# Patient Record
Sex: Male | Born: 1945 | Race: White | Hispanic: No | Marital: Married | Smoking: Former smoker
Health system: Southern US, Community
[De-identification: ages and names within clinical notes are randomized; demographics above are authoritative.]

## PROBLEM LIST (undated history)

## (undated) DIAGNOSIS — Z87442 Personal history of urinary calculi: Secondary | ICD-10-CM

## (undated) DIAGNOSIS — R0602 Shortness of breath: Secondary | ICD-10-CM

## (undated) DIAGNOSIS — M1990 Unspecified osteoarthritis, unspecified site: Secondary | ICD-10-CM

## (undated) DIAGNOSIS — I10 Essential (primary) hypertension: Secondary | ICD-10-CM

## (undated) DIAGNOSIS — I499 Cardiac arrhythmia, unspecified: Secondary | ICD-10-CM

## (undated) DIAGNOSIS — F329 Major depressive disorder, single episode, unspecified: Secondary | ICD-10-CM

## (undated) DIAGNOSIS — G4730 Sleep apnea, unspecified: Secondary | ICD-10-CM

## (undated) DIAGNOSIS — K5790 Diverticulosis of intestine, part unspecified, without perforation or abscess without bleeding: Secondary | ICD-10-CM

## (undated) DIAGNOSIS — M109 Gout, unspecified: Secondary | ICD-10-CM

## (undated) DIAGNOSIS — Z923 Personal history of irradiation: Secondary | ICD-10-CM

## (undated) DIAGNOSIS — R011 Cardiac murmur, unspecified: Secondary | ICD-10-CM

## (undated) DIAGNOSIS — K635 Polyp of colon: Secondary | ICD-10-CM

## (undated) DIAGNOSIS — N200 Calculus of kidney: Secondary | ICD-10-CM

## (undated) DIAGNOSIS — E785 Hyperlipidemia, unspecified: Secondary | ICD-10-CM

## (undated) DIAGNOSIS — J4489 Other specified chronic obstructive pulmonary disease: Secondary | ICD-10-CM

## (undated) DIAGNOSIS — I447 Left bundle-branch block, unspecified: Secondary | ICD-10-CM

## (undated) DIAGNOSIS — F32A Depression, unspecified: Secondary | ICD-10-CM

## (undated) DIAGNOSIS — R079 Chest pain, unspecified: Secondary | ICD-10-CM

## (undated) DIAGNOSIS — H5461 Unqualified visual loss, right eye, normal vision left eye: Secondary | ICD-10-CM

## (undated) DIAGNOSIS — K219 Gastro-esophageal reflux disease without esophagitis: Secondary | ICD-10-CM

## (undated) DIAGNOSIS — G4733 Obstructive sleep apnea (adult) (pediatric): Secondary | ICD-10-CM

## (undated) DIAGNOSIS — J449 Chronic obstructive pulmonary disease, unspecified: Secondary | ICD-10-CM

## (undated) DIAGNOSIS — D649 Anemia, unspecified: Secondary | ICD-10-CM

## (undated) DIAGNOSIS — H269 Unspecified cataract: Secondary | ICD-10-CM

## (undated) DIAGNOSIS — I509 Heart failure, unspecified: Secondary | ICD-10-CM

## (undated) DIAGNOSIS — H349 Unspecified retinal vascular occlusion: Secondary | ICD-10-CM

## (undated) DIAGNOSIS — N189 Chronic kidney disease, unspecified: Secondary | ICD-10-CM

## (undated) HISTORY — DX: Sleep apnea, unspecified: G47.30

## (undated) HISTORY — DX: Unqualified visual loss, right eye, normal vision left eye: H54.61

## (undated) HISTORY — DX: Calculus of kidney: N20.0

## (undated) HISTORY — DX: Shortness of breath: R06.02

## (undated) HISTORY — DX: Hyperlipidemia, unspecified: E78.5

## (undated) HISTORY — DX: Essential (primary) hypertension: I10

## (undated) HISTORY — DX: Depression, unspecified: F32.A

## (undated) HISTORY — PX: CATARACT EXTRACTION: SUR2

## (undated) HISTORY — DX: Polyp of colon: K63.5

## (undated) HISTORY — DX: Major depressive disorder, single episode, unspecified: F32.9

## (undated) HISTORY — DX: Chest pain, unspecified: R07.9

## (undated) HISTORY — DX: Other specified chronic obstructive pulmonary disease: J44.89

## (undated) HISTORY — DX: Obstructive sleep apnea (adult) (pediatric): G47.33

## (undated) HISTORY — DX: Chronic obstructive pulmonary disease, unspecified: J44.9

## (undated) HISTORY — DX: Anemia, unspecified: D64.9

## (undated) HISTORY — DX: Diverticulosis of intestine, part unspecified, without perforation or abscess without bleeding: K57.90

## (undated) HISTORY — PX: BACK SURGERY: SHX140

## (undated) HISTORY — DX: Cardiac arrhythmia, unspecified: I49.9

## (undated) HISTORY — DX: Unspecified cataract: H26.9

## (undated) HISTORY — DX: Left bundle-branch block, unspecified: I44.7

## (undated) HISTORY — DX: Unspecified retinal vascular occlusion: H34.9

## (undated) HISTORY — PX: KIDNEY STONE SURGERY: SHX686

---

## 1999-01-30 ENCOUNTER — Encounter: Admission: RE | Admit: 1999-01-30 | Discharge: 1999-04-30 | Payer: Self-pay | Admitting: Family Medicine

## 2000-12-19 ENCOUNTER — Encounter: Payer: Self-pay | Admitting: Urology

## 2000-12-23 ENCOUNTER — Encounter: Payer: Self-pay | Admitting: Urology

## 2000-12-23 ENCOUNTER — Ambulatory Visit (HOSPITAL_COMMUNITY): Admission: RE | Admit: 2000-12-23 | Discharge: 2000-12-23 | Payer: Self-pay | Admitting: Urology

## 2001-08-27 ENCOUNTER — Ambulatory Visit (HOSPITAL_COMMUNITY): Admission: RE | Admit: 2001-08-27 | Discharge: 2001-08-27 | Payer: Self-pay | Admitting: Family Medicine

## 2002-08-05 ENCOUNTER — Encounter: Payer: Self-pay | Admitting: Family Medicine

## 2002-08-05 ENCOUNTER — Ambulatory Visit (HOSPITAL_COMMUNITY): Admission: RE | Admit: 2002-08-05 | Discharge: 2002-08-05 | Payer: Self-pay | Admitting: Family Medicine

## 2003-12-19 ENCOUNTER — Ambulatory Visit: Payer: Self-pay | Admitting: Family Medicine

## 2004-02-06 ENCOUNTER — Ambulatory Visit: Payer: Self-pay | Admitting: Family Medicine

## 2004-03-20 ENCOUNTER — Ambulatory Visit: Payer: Self-pay | Admitting: Family Medicine

## 2004-05-29 ENCOUNTER — Ambulatory Visit: Payer: Self-pay | Admitting: Family Medicine

## 2004-08-29 ENCOUNTER — Ambulatory Visit: Payer: Self-pay | Admitting: Family Medicine

## 2004-11-29 ENCOUNTER — Ambulatory Visit: Payer: Self-pay | Admitting: Family Medicine

## 2005-01-10 ENCOUNTER — Ambulatory Visit: Payer: Self-pay | Admitting: Family Medicine

## 2005-02-14 ENCOUNTER — Ambulatory Visit: Payer: Self-pay | Admitting: Family Medicine

## 2005-03-12 ENCOUNTER — Ambulatory Visit: Payer: Self-pay | Admitting: Family Medicine

## 2005-04-17 ENCOUNTER — Ambulatory Visit: Payer: Self-pay | Admitting: Family Medicine

## 2005-06-11 ENCOUNTER — Ambulatory Visit: Payer: Self-pay | Admitting: Family Medicine

## 2005-07-25 ENCOUNTER — Ambulatory Visit: Payer: Self-pay | Admitting: Gastroenterology

## 2005-08-08 ENCOUNTER — Ambulatory Visit: Payer: Self-pay | Admitting: Gastroenterology

## 2006-05-16 ENCOUNTER — Ambulatory Visit: Payer: Self-pay | Admitting: Family Medicine

## 2010-06-08 ENCOUNTER — Encounter: Payer: Self-pay | Admitting: Cardiology

## 2010-06-08 ENCOUNTER — Encounter: Payer: Self-pay | Admitting: *Deleted

## 2010-06-08 ENCOUNTER — Ambulatory Visit (INDEPENDENT_AMBULATORY_CARE_PROVIDER_SITE_OTHER): Payer: Medicare Other | Admitting: Cardiology

## 2010-06-08 VITALS — BP 155/79 | HR 67 | Resp 18 | Ht 70.0 in | Wt 204.4 lb

## 2010-06-08 DIAGNOSIS — H349 Unspecified retinal vascular occlusion: Secondary | ICD-10-CM

## 2010-06-08 DIAGNOSIS — E785 Hyperlipidemia, unspecified: Secondary | ICD-10-CM

## 2010-06-08 DIAGNOSIS — I447 Left bundle-branch block, unspecified: Secondary | ICD-10-CM

## 2010-06-08 DIAGNOSIS — G4733 Obstructive sleep apnea (adult) (pediatric): Secondary | ICD-10-CM

## 2010-06-08 DIAGNOSIS — R0600 Dyspnea, unspecified: Secondary | ICD-10-CM

## 2010-06-08 DIAGNOSIS — I10 Essential (primary) hypertension: Secondary | ICD-10-CM

## 2010-06-08 DIAGNOSIS — G4730 Sleep apnea, unspecified: Secondary | ICD-10-CM

## 2010-06-08 DIAGNOSIS — J449 Chronic obstructive pulmonary disease, unspecified: Secondary | ICD-10-CM

## 2010-06-08 DIAGNOSIS — R0602 Shortness of breath: Secondary | ICD-10-CM

## 2010-06-08 DIAGNOSIS — I509 Heart failure, unspecified: Secondary | ICD-10-CM

## 2010-06-08 DIAGNOSIS — R0609 Other forms of dyspnea: Secondary | ICD-10-CM

## 2010-06-08 DIAGNOSIS — R079 Chest pain, unspecified: Secondary | ICD-10-CM

## 2010-06-08 LAB — CBC WITH DIFFERENTIAL/PLATELET
Basophils Absolute: 0.1 10*3/uL (ref 0.0–0.1)
Eosinophils Absolute: 0.3 10*3/uL (ref 0.0–0.7)
HCT: 45.3 % (ref 39.0–52.0)
Hemoglobin: 15.6 g/dL (ref 13.0–17.0)
Lymphs Abs: 2.1 10*3/uL (ref 0.7–4.0)
MCHC: 34.4 g/dL (ref 30.0–36.0)
Monocytes Relative: 7.7 % (ref 3.0–12.0)
Neutro Abs: 7.4 10*3/uL (ref 1.4–7.7)
RDW: 13.8 % (ref 11.5–14.6)

## 2010-06-08 LAB — LIPID PANEL
LDL Cholesterol: 76 mg/dL (ref 0–99)
Total CHOL/HDL Ratio: 5
Triglycerides: 122 mg/dL (ref 0.0–149.0)
VLDL: 24.4 mg/dL (ref 0.0–40.0)

## 2010-06-08 LAB — HEPATIC FUNCTION PANEL
Albumin: 3.7 g/dL (ref 3.5–5.2)
Alkaline Phosphatase: 58 U/L (ref 39–117)

## 2010-06-08 LAB — BASIC METABOLIC PANEL
CO2: 28 mEq/L (ref 19–32)
Glucose, Bld: 70 mg/dL (ref 70–99)
Potassium: 3.8 mEq/L (ref 3.5–5.1)
Sodium: 141 mEq/L (ref 135–145)

## 2010-06-08 MED ORDER — LISINOPRIL-HYDROCHLOROTHIAZIDE 20-12.5 MG PO TABS: ORAL_TABLET | ORAL | Status: DC

## 2010-06-08 NOTE — Patient Instructions (Addendum)
Increase Lisinopril/HCT to two 20/12.5mg  tablets daily. This will be 40/25mg  daily.   Stop Coreg(carvedilol).   Lab today--BMP/CBC/PT(STAT)--lipid profile/liver profile/BNP  786.50  428.0  426.3  401.9  Schedule an appointment for an echocardiogram.  Schedule an appointment for a sleep study.  Schedule an appointment for PFTs --pulmonary function tests.  Cardiac Cathetherization scheduled for Monday May 14,2012. See instruction sheet.  Schedule an appointment to see Dr Aundra Dubin in 3 weeks.

## 2010-06-10 DIAGNOSIS — J449 Chronic obstructive pulmonary disease, unspecified: Secondary | ICD-10-CM | POA: Insufficient documentation

## 2010-06-10 DIAGNOSIS — R0600 Dyspnea, unspecified: Secondary | ICD-10-CM | POA: Insufficient documentation

## 2010-06-10 DIAGNOSIS — G4733 Obstructive sleep apnea (adult) (pediatric): Secondary | ICD-10-CM | POA: Insufficient documentation

## 2010-06-10 DIAGNOSIS — E785 Hyperlipidemia, unspecified: Secondary | ICD-10-CM | POA: Insufficient documentation

## 2010-06-10 DIAGNOSIS — H349 Unspecified retinal vascular occlusion: Secondary | ICD-10-CM | POA: Insufficient documentation

## 2010-06-10 DIAGNOSIS — I10 Essential (primary) hypertension: Secondary | ICD-10-CM | POA: Insufficient documentation

## 2010-06-10 NOTE — Assessment & Plan Note (Signed)
Patient screens strongly positive for OSA.  This certainly could be contributing to his symptoms.  I will arrange a sleep study.

## 2010-06-10 NOTE — Assessment & Plan Note (Signed)
Check lipids/LFTs.

## 2010-06-10 NOTE — Assessment & Plan Note (Signed)
PFTs to assess for extent of obstructive lung disease.  Apparently, these have never been done.

## 2010-06-10 NOTE — Assessment & Plan Note (Signed)
Of uncertain etiology.  3-week event monitor reportedly did not show atrial fibrillation.  No signficant carotid stenosis on recent CTA of the neck.  For now, continue ASA and Plavix.  Low threshold to anticoagulate if any sign of atrial fibrillation is seen.

## 2010-06-10 NOTE — Assessment & Plan Note (Signed)
BP continues to run high.  Increase lisinopril/HCT to 40/25 from 20/12.5 with BMET in 2 wks.

## 2010-06-10 NOTE — Progress Notes (Signed)
PCP: Dr. Edrick Oh in [place] and Dr. Para Skeans in [place]  65 yo with history of chronic LBBB, possible COPD, diabetes, HTN presents for evaluation of exertional dyspnea.  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  He was seen by a cardiologist and set up for a stress cardiolite and an echo, both of which were essentially normal (10/11).  He wore an event monitor for 3 weeks given concern that he could have had cardioembolism in the setting of atrial fibrillation.  There was no atrial fibrillation on the monitor.  More recently, he describes gradually worsening exertional dyspnea for the last 2-3 months.  By the end of April, he was very short of breath with minimal exertion.  He was admitted to the hospital in [place], [place] where he and his wife live.  He was thought to have a COPD exacerbation (had never been given the diagnosis of COPD prior to this).  He was treated with oxygen, nebulizers, and IV steroids.  He felt better with treatment and was discharged home.  Cardiac enzymes were negative and pro-BNP was 159, which is not significantly elevated.  He was seen by a cardiologist in the hospital who was concerned that his lung disease did not seem bad enough to explain his entire symptomatology, and recommended cardiac cath.  Cardiac cath is not done at the hospital in [place].  He was referred here by his daughter who lives in [place].    Currently, he is doing somewhat better.  He still is short of breath trying to go up steps or up an incline.  He was able to walk in the mall yesterday without much problem.  He has never had significant chest pain throughout this course.  He has had a long history of difficult to control BP.  BP today is 155/79.  It has been running systolic [id number] at home (which he says is good for him).  He is currently being treated with a course of antibiotics for acute bronchitis by Dr. Edrick Oh who serves as his PCP  when he is in this area visiting his daughter.  He is wearing oxygen at night.  Of note, his wife is very concerned about his snoring and gasping at night.  He says that he is often drowsy during the day.    ECG: NSR, LBBB  Labs (4/12): K 3.6, creatinine 1.2, proBNP 159, cardiac enzymes negative, D dimer negative   PMH: 1. Chronic LBBB 2. Exertional dyspnea: COPD versus cardiac.  Cardiolite stress test 10/11 in [place] with EF 50%, septal hypokinesis (LBBB), no evidence for ischemia or infarction.  Echo (10/11) in [place] with EF 55%, no significant valvular abnormalities.  Patient admitted to hospital in [place] in 4/12 with dyspnea, thought to be COPD exacerbation.  3. Diabetes mellitus 4. HTN: Poor control for years 5. Hyperlipidemia 6. 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  Patient did a 3 week event monitor with no atrial fibrillation (1 short run of regular SVT only).  CTA neck (4/12): No significant internal carotid artery stenosis.  7. COPD: Never had PFTs but smoked heavily in the past.  8. 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  Originally from [place] and moved back there after retirement.  Has a daughter living in [place].  Quit smoking around 1988.  Married.   FH: Brother with MI in his 15s  and CHF.  Brother also had some form of EP ablation.  Father with cerebral hemorrhage.   ROS: All systems reviewed and negative except as per HPI.   Current Outpatient Prescriptions  Medication Sig Dispense Refill  . Albuterol Sulfate (PROAIR HFA IN) Inhale into the lungs. Take 1 puff by mouth 4 times a day as needed. For dyspnea.       Marland Kitchen aspirin 81 MG tablet Take 81 mg by mouth daily.        . cefdinir (OMNICEF) 300 MG capsule Take 300 mg by mouth 2 (two) times daily. Take 1 capsule by mouth twice day for ten days.       . clopidogrel (PLAVIX) 75 MG tablet Take 75 mg by mouth daily.        .  Fluticasone-Salmeterol (ADVAIR DISKUS) 250-50 MCG/DOSE AEPB Inhale 1 puff into the lungs every 12 (twelve) hours.        Marland Kitchen glipiZIDE (GLUCOTROL) 10 MG tablet Take 10 mg by mouth 2 (two) times daily before a meal.        . HYDROcodone-homatropine (HYCODAN) 5-1.5 MG/5ML syrup Take by mouth every 6 (six) hours as needed.        Marland Kitchen lisinopril-hydrochlorothiazide (PRINZIDE,ZESTORETIC) 20-12.5 MG per tablet Take two daily  60 tablet  6  . metFORMIN (GLUCOPHAGE) 500 MG tablet Take 500 mg by mouth 2 (two) times daily with a meal.        . sertraline (ZOLOFT) 50 MG tablet Take 50 mg by mouth daily.        . simvastatin (ZOCOR) 40 MG tablet Take 40 mg by mouth at bedtime.        . sitaGLIPtan (JANUVIA) 100 MG tablet Take 100 mg by mouth daily.        Marland Kitchen terazosin (HYTRIN) 2 MG capsule Take 2 mg by mouth at bedtime.        . metoprolol (LOPRESSOR) 50 MG tablet Take 1 tablet (50 mg total) by mouth 2 (two) times daily.        BP 155/79  Pulse 67  Resp 18  Ht 5\' 10"  (1.778 m)  Wt 204 lb 6.4 oz (92.715 kg)  BMI 29.33 kg/m2 General: NAD Neck: No JVD, no thyromegaly or thyroid nodule.  Lungs: Distant breath sounds with some rhonchi. CV: Nondisplaced PMI.  Heart regular S1/S2, no S3/S4, no murmur.  No peripheral edema.  No carotid bruit.  Normal pedal pulses.  Abdomen: Soft, nontender, no hepatosplenomegaly, no distention.  Skin: Intact without lesions or rashes.  Neurologic: Alert and oriented x 3.  Psych: Normal affect. Extremities: No clubbing or cyanosis.  HEENT: Normal.

## 2010-06-10 NOTE — Assessment & Plan Note (Signed)
Significant, relatively new exertional dyspnea.  Patient has numerous cardiac risk factors including DM, HTN, prior smoking, and LBBB.  His symptoms could certainly be due to COPD, but he is also at high risk for obstructive CAD.  I would like to rule out CAD as a prime cause of his symptom pattern.   - RHC/LHC: I talked to the patient about risks and benefits, including 01/998 risk of severe complication like stroke, death or MI.  He agrees to proceed.  - Echo to reassess LV systolic function and diastolic function.  - BNP - Patient will continue ASA 81, Plavix, statin, ACEI, and beta blocker.  He is taking both Coreg and metoprolol.  He only needs to take the metoprolol and can stop the Coreg.  Metoprolol is more beta-1 selective and may be less likely to cause bronchospasm given his presumed lung disease.

## 2010-06-11 ENCOUNTER — Inpatient Hospital Stay (HOSPITAL_BASED_OUTPATIENT_CLINIC_OR_DEPARTMENT_OTHER)
Admission: RE | Admit: 2010-06-11 | Discharge: 2010-06-11 | Disposition: A | Discharge status: Home / Self Care | Payer: Medicare Other | Source: Ambulatory Visit | Attending: Cardiology | Admitting: Cardiology

## 2010-06-11 DIAGNOSIS — Z87891 Personal history of nicotine dependence: Secondary | ICD-10-CM | POA: Insufficient documentation

## 2010-06-11 DIAGNOSIS — E785 Hyperlipidemia, unspecified: Secondary | ICD-10-CM | POA: Insufficient documentation

## 2010-06-11 DIAGNOSIS — I2510 Atherosclerotic heart disease of native coronary artery without angina pectoris: Secondary | ICD-10-CM | POA: Insufficient documentation

## 2010-06-11 DIAGNOSIS — R0989 Other specified symptoms and signs involving the circulatory and respiratory systems: Secondary | ICD-10-CM | POA: Insufficient documentation

## 2010-06-11 DIAGNOSIS — J4489 Other specified chronic obstructive pulmonary disease: Secondary | ICD-10-CM | POA: Insufficient documentation

## 2010-06-11 DIAGNOSIS — R0602 Shortness of breath: Secondary | ICD-10-CM | POA: Insufficient documentation

## 2010-06-11 DIAGNOSIS — R0609 Other forms of dyspnea: Secondary | ICD-10-CM | POA: Insufficient documentation

## 2010-06-11 DIAGNOSIS — E119 Type 2 diabetes mellitus without complications: Secondary | ICD-10-CM | POA: Insufficient documentation

## 2010-06-11 DIAGNOSIS — J449 Chronic obstructive pulmonary disease, unspecified: Secondary | ICD-10-CM | POA: Insufficient documentation

## 2010-06-11 LAB — POCT I-STAT 3, ART BLOOD GAS (G3+)
Acid-Base Excess: 1 mmol/L (ref 0.0–2.0)
Bicarbonate: 25.6 mEq/L — ABNORMAL HIGH (ref 20.0–24.0)
O2 Saturation: 92 %
TCO2: 27 mmol/L (ref 0–100)
pCO2 arterial: 41.6 mmHg (ref 35.0–45.0)
pH, Arterial: 7.397 (ref 7.350–7.450)
pO2, Arterial: 63 mmHg — ABNORMAL LOW (ref 80.0–100.0)

## 2010-06-11 LAB — POCT I-STAT 3, VENOUS BLOOD GAS (G3P V)
Acid-Base Excess: 2 mmol/L (ref 0.0–2.0)
O2 Saturation: 61 %
pO2, Ven: 33 mmHg (ref 30.0–45.0)

## 2010-06-11 LAB — POCT I-STAT GLUCOSE: Glucose, Bld: 118 mg/dL — ABNORMAL HIGH (ref 70–99)

## 2010-06-14 ENCOUNTER — Ambulatory Visit (HOSPITAL_BASED_OUTPATIENT_CLINIC_OR_DEPARTMENT_OTHER): Payer: Medicare Other | Attending: Cardiology

## 2010-06-14 DIAGNOSIS — G4733 Obstructive sleep apnea (adult) (pediatric): Secondary | ICD-10-CM | POA: Insufficient documentation

## 2010-06-14 DIAGNOSIS — G4737 Central sleep apnea in conditions classified elsewhere: Secondary | ICD-10-CM | POA: Insufficient documentation

## 2010-06-14 NOTE — Cardiovascular Report (Signed)
Jeffery Fernandez, GEHRES NO.:  [id number]  MEDICAL RECORD NO.:  [id number]           PATIENT TYPE:  LOCATION:                                 FACILITY:  PHYSICIAN:  Loralie Champagne, MD           DATE OF BIRTH:  DATE OF PROCEDURE:  06/11/2010 DATE OF DISCHARGE:                           CARDIAC CATHETERIZATION   PROCEDURES: 1. Left heart catheterization. 2. Right heart catheterization. 3. Coronary angiography 4. Left ventriculography.  INDICATIONS:  This is a 65 year old with history of diabetes, hyperlipidemia, and prior smoking who was recently admitted to the hospital of [place] with severe shortness of breath.  He was treated for a COPD exacerbation but it was question whether his COPD is actually significant enough to explain his degree of dyspnea.  He therefore was sent here for evaluation for right and left heart catheterization which I thought was reasonable for him to do. Therefore, he was brought to the cath lab today for right and left heart catheterization.  PROCEDURE NOTE:  After informed consent was obtained, the right groin was sterilely prepped and draped.  Lidocaine 1% was used to locally anesthetize the right groin area.  The right common femoral vein was entered using modified Seldinger technique and a 7-French venous sheath was placed.  Right common femoral artery was entered using modified Seldinger technique and a 4-French arterial sheath was placed.  We did not opacify the distal LAD well with 4-French catheters.  Therefore, we upsized the sheath to a 5-French sheath later in the case.  The left coronary artery was engaged using the JL-4 catheter and the right coronary artery was engaged using the 3DRCA catheter and the left ventricle was entered using angled pigtail catheter.  There were no complications.  FINDINGS: 1. Hemodynamics.  Mean right atrial pressure 5 mmHg, RV 34/8, PA 34/12     with mean PA pressure 22 mmHg.  Mean  pulmonary capillary wedge     pressure 4 mmHg, LV 160/19, aorta 151/72.  Aortic saturation 92%,     PA saturation 61%.  This gives [place] a cardiac output of 4 and a     cardiac index of 1.9.  However actually showed that the PA sat     completely accurate as the low cardiac output does not fit well     with the rest of the numbers from this case. 2. Left ventriculography.  EF was estimated to be 55%.  There were no     regional wall motion abnormalities. 3. Right coronary artery.  The right coronary artery was a dominant     vessel.  There is a 60% mid RCA stenosis followed shortly     thereafter by second 60-70% mid RCA stenosis. 4. Left main.  The left main had no angiographic coronary artery     disease. 5. LAD system.  The LAD system had luminal irregularities, otherwise     no significant disease. 6. Left circumflex system.  The left circumflex system had mild     luminal irregularities only.  IMPRESSION:  The patient does have  up to 60-70% moderate stenosis in the mid right coronary artery.  I really do not think that this explains the patient's quite significant dyspnea resulting in hospitalization in April.  I suspect that chronic obstructive pulmonary disease may end up being the main cause of his shortness of breath.  His right and left heart filling pressures were not elevated.  His LV systolic function is normal.  There is minimal disease in the circumflex and in the LAD.  I think continuing aggressive medical treatment for coronary artery disease is appropriate and he will, as planned, get PFTs and a sleep study.     Loralie Champagne, MD     DM/MEDQ  D:  06/11/2010  T:  06/12/2010  Job:  [id number]  cc:   Margarita Rana, M.D.  Electronically Signed by Loralie Champagne MD on 06/14/2010 11:50:28 PM

## 2010-06-18 ENCOUNTER — Telehealth: Payer: Self-pay | Admitting: Cardiology

## 2010-06-18 NOTE — Telephone Encounter (Signed)
Patient's wife states that an echo was scheduled prior patient having a cardiac cath.  on May 15 th 2012 . Patient would like to know if he still needs to have the echo, whish is scheduled for this Wednesday. Patient also would like to have the sleep study results. Study is not in the patient's records at this time.   Dr. Aundra Dubin recommends for pt. to have an echo as scheduled. Patient and wife aware.

## 2010-06-18 NOTE — Telephone Encounter (Signed)
Pt wants results of sleep study, pt had heart cath last Monday, has an echo on Wednesday , does he need to still do the echo

## 2010-06-20 ENCOUNTER — Ambulatory Visit (HOSPITAL_COMMUNITY): Payer: Medicare Other | Attending: Cardiology

## 2010-06-20 ENCOUNTER — Ambulatory Visit (INDEPENDENT_AMBULATORY_CARE_PROVIDER_SITE_OTHER): Payer: Medicare Other | Admitting: Internal Medicine

## 2010-06-20 DIAGNOSIS — I509 Heart failure, unspecified: Secondary | ICD-10-CM

## 2010-06-20 DIAGNOSIS — R079 Chest pain, unspecified: Secondary | ICD-10-CM

## 2010-06-20 DIAGNOSIS — R0609 Other forms of dyspnea: Secondary | ICD-10-CM | POA: Insufficient documentation

## 2010-06-20 DIAGNOSIS — R0989 Other specified symptoms and signs involving the circulatory and respiratory systems: Secondary | ICD-10-CM | POA: Insufficient documentation

## 2010-06-20 DIAGNOSIS — J449 Chronic obstructive pulmonary disease, unspecified: Secondary | ICD-10-CM

## 2010-06-20 DIAGNOSIS — R0602 Shortness of breath: Secondary | ICD-10-CM

## 2010-06-20 DIAGNOSIS — I079 Rheumatic tricuspid valve disease, unspecified: Secondary | ICD-10-CM | POA: Insufficient documentation

## 2010-06-20 DIAGNOSIS — I359 Nonrheumatic aortic valve disorder, unspecified: Secondary | ICD-10-CM | POA: Insufficient documentation

## 2010-06-20 LAB — PULMONARY FUNCTION TEST

## 2010-06-20 NOTE — Progress Notes (Signed)
PFT done today. 

## 2010-06-21 DIAGNOSIS — G4737 Central sleep apnea in conditions classified elsewhere: Secondary | ICD-10-CM

## 2010-06-21 DIAGNOSIS — G4733 Obstructive sleep apnea (adult) (pediatric): Secondary | ICD-10-CM

## 2010-06-21 NOTE — Procedures (Signed)
Jeffery Fernandez, HILFIKER NO.:  [id number]  MEDICAL RECORD NO.:  [id number]          PATIENT TYPE:  OUT  LOCATION:  SLEEP CENTER                 FACILITY:  [facility]  PHYSICIAN:  Kathee Delton, MD,FCCPDATE OF BIRTH:  [birth year]  DATE OF STUDY:  06/14/2010                           NOCTURNAL POLYSOMNOGRAM  REFERRING PHYSICIAN:  DALTON MCLEAN  INDICATION FOR STUDY:  Hypersomnia with sleep apnea.  EPWORTH SCORE:  10..  SLEEP ARCHITECTURE:  The patient had a total sleep time of 245 minutes with no slow wave sleep and only 54 minutes of REM.  Sleep onset latency was mildly prolonged at 38 minutes, and REM onset was mildly prolonged at 131 minutes.  Sleep efficiency was 66% during the diagnostic portion of the study, and 62% during the titration portion.  RESPIRATORY DATA:  The patient underwent a split night protocol where he was found to have 114 obstructive and central events in the first 120 minutes of sleep.  This gave him an apnea/hypopnea index during the diagnostic portion of the study at 57 events per hour.  The obstructive events greatly outnumbered the central events.  The patient's apneas and hypopneas occurred primarily during supine sleep, and there was moderate to loud snoring noted throughout.  By protocol, the patient was then fitted with a medium ResMed Quattro full-face mask, and CPAP titration was initiated.  The patient was increased to a level as high as 15 cm of water pressure in order to control events, however, there was increased pressure induced central events at that level.  I would recommend a treatment pressure of 14 cm of water.  OXYGEN DATA:  There was O2 desaturation as low as 83% with the patient's obstructive events.  CARDIAC DATA:  No clinically significant arrhythmias were noted.  MOVEMENT/PARASOMNIA:  The patient had no significant leg jerks or other abnormal behavior seen.  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  The patient was then placed on a medium ResMed Quattro full-face mask, and titrated to an optimal pressure of 14 cm of water pressure.  He should also be encouraged to work on modest weight loss.     Kathee Delton, MD,FCCP Diplomate, [place] Board of Sleep Medicine Electronically Signed    [facility]/MEDQ  D:  06/21/2010 08:05:09  T:  06/21/2010 20:11:33  Job:  [id number]

## 2010-06-28 ENCOUNTER — Telehealth: Payer: Self-pay | Admitting: Cardiology

## 2010-06-28 NOTE — Telephone Encounter (Signed)
Jeffery Fernandez patient's daughter called to get the echo, sleep study and PFT results. Echo results given. I was able to find the sleep study in E chart. Pulmonary medicine has faxed PFT to Dr. Aundra Dubin on Friday . Pt's daughter is waiting to be call for the results today.

## 2010-06-28 NOTE — Telephone Encounter (Signed)
Please refer to pulmonary for sleep evaluation.

## 2010-06-28 NOTE — Telephone Encounter (Signed)
They would like results of all test done a couple weeks ago

## 2010-06-28 NOTE — Telephone Encounter (Signed)
Went over the results of patient's PFT's (which were normal) with his daughter. The sleep study was read by Dr. Gwenette Greet and apparently the patient required BIPAP during the night with his oxygen dropping to 83%. Patient's daughter is concerned that he should be wearing this mask continuously and needs a f/u with pulmonary. Advised her that he probably does so I will route this to Dr. Aundra Dubin to review. Sleep study can be found in e-chart.

## 2010-07-02 ENCOUNTER — Other Ambulatory Visit: Payer: Self-pay | Admitting: *Deleted

## 2010-07-02 DIAGNOSIS — G4730 Sleep apnea, unspecified: Secondary | ICD-10-CM

## 2010-07-02 NOTE — Telephone Encounter (Signed)
I talked with pt's wife. She is aware Dr Aundra Dubin will  refer to pulmonary for evaluation and management of sleep apnea.

## 2010-07-09 ENCOUNTER — Encounter: Payer: Self-pay | Admitting: Cardiology

## 2010-07-10 ENCOUNTER — Ambulatory Visit (INDEPENDENT_AMBULATORY_CARE_PROVIDER_SITE_OTHER): Payer: Medicare Other | Admitting: Cardiology

## 2010-07-10 ENCOUNTER — Encounter: Payer: Self-pay | Admitting: Cardiology

## 2010-07-10 DIAGNOSIS — H349 Unspecified retinal vascular occlusion: Secondary | ICD-10-CM

## 2010-07-10 DIAGNOSIS — I10 Essential (primary) hypertension: Secondary | ICD-10-CM

## 2010-07-10 DIAGNOSIS — E785 Hyperlipidemia, unspecified: Secondary | ICD-10-CM

## 2010-07-10 DIAGNOSIS — J449 Chronic obstructive pulmonary disease, unspecified: Secondary | ICD-10-CM

## 2010-07-10 DIAGNOSIS — I2510 Atherosclerotic heart disease of native coronary artery without angina pectoris: Secondary | ICD-10-CM | POA: Insufficient documentation

## 2010-07-10 DIAGNOSIS — R0600 Dyspnea, unspecified: Secondary | ICD-10-CM

## 2010-07-10 DIAGNOSIS — R0609 Other forms of dyspnea: Secondary | ICD-10-CM

## 2010-07-10 NOTE — Assessment & Plan Note (Signed)
BP is better-controlled with medication uptitration.  Continue current medications.

## 2010-07-10 NOTE — Assessment & Plan Note (Signed)
60-70% mRCA stenosis is unlikely to be a cause of symptoms.  Treat with aggressive risk factor modification.  He is on ASA, Plavix, beta blocker, ACEI, and statin.

## 2010-07-10 NOTE — Patient Instructions (Signed)
Your physician wants you to follow-up in: 6 months with Dr Aundra Dubin. ( December 2012) You will receive a reminder letter in the mail two months in advance. If you don't receive a letter, please call our office to schedule the follow-up appointment.

## 2010-07-10 NOTE — Assessment & Plan Note (Signed)
Goal LDL < 70 with CAD.

## 2010-07-10 NOTE — Assessment & Plan Note (Signed)
Of uncertain etiology.  3-week event monitor reportedly did not show atrial fibrillation.  No signficant carotid stenosis on recent CTA of the neck.  For now, continue ASA and Plavix.  Low threshold to anticoagulate if any sign of atrial fibrillation is seen.

## 2010-07-10 NOTE — Assessment & Plan Note (Signed)
Despite prior heavy smoking, there is no evidence for significant COPD by PFTs.

## 2010-07-10 NOTE — Progress Notes (Signed)
PCP: Dr. Edrick Oh in [place] and Dr. Para Skeans in [place]  65 yo with history of chronic LBBB, possible COPD, diabetes, HTN presented initially for evaluation of exertional dyspnea.  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  He was seen by a cardiologist and set up for a stress cardiolite and an echo, both of which were essentially normal (10/11).  He wore an event monitor for 3 weeks given concern that he could have had cardioembolism in the setting of atrial fibrillation.  There was no atrial fibrillation on the monitor.  More recently, he describes gradually worsening exertional dyspnea for the last several months.  By the end of April, he was very short of breath with minimal exertion.  He was admitted to the hospital in [place], [place] where he and his wife live.  He was thought to have a COPD exacerbation (had never been given the diagnosis of COPD prior to this).  He was treated with oxygen, nebulizers, and IV steroids.  He felt better with treatment and was discharged home.  Cardiac enzymes were negative and pro-BNP was 159, which is not significantly elevated.  He was seen by a cardiologist in the hospital who was concerned that his lung disease did not seem bad enough to explain his entire symptomatology, and recommended cardiac cath.    I saw him initially in 5/12 and set him up for right and left heart catheterization.  This showed normal right and left heart filling pressures.  There was a 60-70% mid RCA stenosis that seemed unlikely to explain his symptoms.  Echo showed EF 55% with normal RV.  PFTs actually were within normal limits.  However, he had a sleep study showing severe OSA.  Patient actually is not having as much dyspnea.  He is mainly short of breath walking up hills and does not have much trouble on flat ground.  No chest pain.    ECG: NSR, LBBB  Labs (4/12): K 3.6, creatinine 1.2, proBNP 159, cardiac enzymes negative, D  dimer negative  Labs (5/12): K 3.8, creatinine 1.1, BNP 18, LDL 76, HDL 27  PMH: 1. Chronic LBBB 2. Exertional dyspnea:  Cardiolite stress test 10/11 in [place] with EF 50%, septal hypokinesis (LBBB), no evidence for ischemia or infarction.  Patient admitted to hospital in [place] in 4/12 with dyspnea, thought to be COPD exacerbation.  LHC/RHC (5/12): mean RA 5, PA 34/12, mean PCWP 4, EF 55%, 60-70% mid RCA stenosis.  PFTs (5/12) with FVC 90%, FEV1 97%, ratio 74%, TLC 101%, DLCO 123% (essentially normal).  Echo (5/12): EF 55% with septal bounce consistent with LBBB, normal RV, no significant valvular abnormalities.  3. Diabetes mellitus 4. HTN: Poor control for years 5. Hyperlipidemia 6. 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  Patient did a 3 week event monitor with no atrial fibrillation (1 short run of regular SVT only).  CTA neck (4/12): No significant internal carotid artery stenosis.  7. CAD: LHC (5/12) with 60-70% mRCA stenosis.  8. 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  Has a daughter living in [place].  Quit smoking around 1988.  Married.   FH: Brother with MI in his 29s and CHF.  Brother also had some form of EP ablation.  Father with cerebral hemorrhage.   Current Outpatient Prescriptions  Medication Sig  Dispense Refill  . Albuterol Sulfate (PROAIR HFA IN) Inhale into the lungs. Take 1 puff by mouth 4 times a day as needed. For dyspnea.       Marland Kitchen aspirin 81 MG tablet Take 81 mg by mouth daily.        . clopidogrel (PLAVIX) 75 MG tablet Take 75 mg by mouth daily.        . Fluticasone-Salmeterol (ADVAIR DISKUS) 250-50 MCG/DOSE AEPB Inhale 1 puff into the lungs every 12 (twelve) hours.        Marland Kitchen glipiZIDE (GLUCOTROL) 10 MG tablet Take 10 mg by mouth 2 (two) times daily before a meal.        . lisinopril-hydrochlorothiazide  (PRINZIDE,ZESTORETIC) 20-12.5 MG per tablet Take two daily  60 tablet  6  . metFORMIN (GLUCOPHAGE) 500 MG tablet Take 500 mg by mouth 2 (two) times daily with a meal.        . metoprolol (LOPRESSOR) 50 MG tablet Take 1 tablet (50 mg total) by mouth 2 (two) times daily.      . sertraline (ZOLOFT) 50 MG tablet Take 50 mg by mouth daily.        . simvastatin (ZOCOR) 40 MG tablet Take 40 mg by mouth at bedtime.        . sitaGLIPtan (JANUVIA) 100 MG tablet Take 100 mg by mouth daily.        Marland Kitchen terazosin (HYTRIN) 2 MG capsule Take 2 mg by mouth at bedtime.        Marland Kitchen DISCONTD: cefdinir (OMNICEF) 300 MG capsule Take 300 mg by mouth 2 (two) times daily. Take 1 capsule by mouth twice day for ten days.       Marland Kitchen DISCONTD: HYDROcodone-homatropine (HYCODAN) 5-1.5 MG/5ML syrup Take by mouth every 6 (six) hours as needed.          BP 126/68  Pulse 80  Ht 5\' 8"  (1.727 m)  Wt 203 lb (92.08 kg)  BMI 30.87 kg/m2 General: NAD, overweight Neck: No JVD, no thyromegaly or thyroid nodule.  Lungs: Distant breath sounds with some rhonchi. CV: Nondisplaced PMI.  Heart regular S1/S2, no S3/S4, no murmur.  No peripheral edema.  No carotid bruit.  Normal pedal pulses.  Abdomen: Soft, nontender, no hepatosplenomegaly, no distention.  Neurologic: Alert and oriented x 3.  Psych: Normal affect. Extremities: No clubbing or cyanosis.

## 2010-07-10 NOTE — Assessment & Plan Note (Signed)
Echo showed preserved EF, BNP was not elevated, PFTs were essentially normal, and RHC/LHC showed normal right and left heart filling pressures with a moderate RCA stenosis that would unlikely to cause symptoms.  He is actually less short of breath than at last appointment.  He does have severe OSA which may be the cause of his symptoms.  He has followup with Dr. Gwenette Greet to get set up for CPAP.

## 2010-07-19 ENCOUNTER — Encounter: Payer: Self-pay | Admitting: Cardiology

## 2010-07-23 ENCOUNTER — Encounter: Payer: Self-pay | Admitting: Pulmonary Disease

## 2010-07-24 ENCOUNTER — Ambulatory Visit (INDEPENDENT_AMBULATORY_CARE_PROVIDER_SITE_OTHER): Payer: Medicare Other | Admitting: Pulmonary Disease

## 2010-07-24 ENCOUNTER — Encounter: Payer: Self-pay | Admitting: Pulmonary Disease

## 2010-07-24 VITALS — BP 122/70 | HR 69 | Ht 70.5 in | Wt 204.8 lb

## 2010-07-24 DIAGNOSIS — G4733 Obstructive sleep apnea (adult) (pediatric): Secondary | ICD-10-CM

## 2010-07-24 NOTE — Assessment & Plan Note (Signed)
The pt has severe osa by his recent sleep study, as well as significant cardiovascular disease that can be greatly impacted by sleep disordered breathing.  He also is symptomatic during the day with periods of inactivity.  I have had a long discussion with the pt about sleep apnea, including its impact on QOL and CV health.  I think he would benefit from treatment with cpap, and the pt is agreeable.  I will set the patient up on cpap at a moderate pressure level to allow for desensitization, and will troubleshoot the device over the next 4-6weeks if needed.  The pt is to call me if having issues with tolerance.  Will then optimize the pressure once patient is able to wear cpap on a consistent basis.

## 2010-07-24 NOTE — Progress Notes (Signed)
  Subjective:    Patient ID: Jeffery Fernandez, male    DOB: 24-Mar-1945, 65 y.o.   MRN: [id number]  HPI The pt is a 65y/o male who I have been asked to see for management of osa.  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  The pt's history is significant for: -loud snoring with abnormal breathing pattern during sleep -restless and nonrestorative sleep per pt -dozes during the day if sits down per wife, but pt denies sleepiness while driving -weight down 15 pounds last 2 yrs, and epworth score 10.  Sleep Questionnaire: What time do you typically go to bed?( Between what hours) 9:30 to 10 pm How long does it take you to fall asleep? immediately How many times during the night do you wake up? 3 What time do you get out of bed to start your day? 0700 Do you drive or operate heavy machinery in your occupation? No How much has your weight changed (up or down) over the past two years? (In pounds) 15 lb (6.804 kg) Have you ever had a sleep study before? Yes If yes, location of study? [facility] If yes, date of study? May 2012 Do you currently use CPAP? No Do you wear oxygen at any time? No     Review of Systems  Constitutional: Negative for fever and unexpected weight change.  HENT: Negative for ear pain, nosebleeds, congestion, sore throat, rhinorrhea, sneezing, trouble swallowing, dental problem, postnasal drip and sinus pressure.   Eyes: Negative for redness and itching.  Respiratory: Positive for shortness of breath. Negative for cough, chest tightness and wheezing.   Cardiovascular: Negative for palpitations and leg swelling.  Gastrointestinal: Negative for nausea and vomiting.  Genitourinary: Negative for dysuria.  Musculoskeletal: Negative for joint swelling.  Skin: Negative for rash.  Neurological: Negative for headaches.  Hematological: Does not bruise/bleed easily.  Psychiatric/Behavioral: Positive for  dysphoric mood. The patient is not nervous/anxious.        Objective:   Physical Exam Constitutional:  Well developed, no acute distress  HENT:  Nares with deviated septum to right with near total obstruction, left patent  Oropharynx without exudate, palate and uvula are mildly elongated, +soft tissue redundancy posteriorly Eyes:  Perrla, eomi, no scleral icterus  Neck:  No JVD, no TMG  Cardiovascular:  Normal rate, regular rhythm, no rubs or gallops.  No murmurs        Intact distal pulses  Pulmonary :  Normal breath sounds, no stridor or respiratory distress   No rales, rhonchi, or wheezing  Abdominal:  Soft, nondistended, bowel sounds present.  No tenderness noted.   Musculoskeletal: very mild lower extremity edema noted.  Lymph Nodes:  No cervical lymphadenopathy noted  Skin:  No cyanosis noted  Neurologic:  Alert, appropriate, moves all 4 extremities without obvious deficit.         Assessment & Plan:

## 2010-07-24 NOTE — Patient Instructions (Signed)
Will start on cpap, and please call if having issues with tolerance Work on weight loss followup with me in 5weeks

## 2010-07-31 ENCOUNTER — Encounter: Payer: Self-pay | Admitting: Pulmonary Disease

## 2010-09-01 ENCOUNTER — Encounter: Payer: Self-pay | Admitting: Gastroenterology

## 2010-09-12 ENCOUNTER — Encounter: Payer: Self-pay | Admitting: Pulmonary Disease

## 2010-09-12 ENCOUNTER — Ambulatory Visit (INDEPENDENT_AMBULATORY_CARE_PROVIDER_SITE_OTHER): Payer: Medicare Other | Admitting: Pulmonary Disease

## 2010-09-12 VITALS — BP 128/70 | HR 65 | Temp 97.7°F | Ht 70.5 in | Wt 207.4 lb

## 2010-09-12 DIAGNOSIS — G4733 Obstructive sleep apnea (adult) (pediatric): Secondary | ICD-10-CM

## 2010-09-12 NOTE — Progress Notes (Signed)
  Subjective:    Patient ID: Jeffery Fernandez, male    DOB: December 29, 1945, 65 y.o.   MRN: [id number]  HPI The patient comes in today for followup of his known severe sleep apnea.  He was started on CPAP at the last visit and a moderate pressure, and has been doing well with the device.  His download today shows an 84% compliance at greater than or equal to 4 hours of use.  He reports no issues with his mask fit or pressure tolerance.  His wife states that she is pertinent for her snoring that he is sleeping much quieter.  He has noted improved daytime alertness.   Review of Systems  Constitutional: Negative for fever and unexpected weight change.  HENT: Negative for ear pain, nosebleeds, congestion, sore throat, rhinorrhea, sneezing, trouble swallowing, dental problem, postnasal drip and sinus pressure.   Eyes: Positive for itching. Negative for redness.  Respiratory: Negative for cough, chest tightness, shortness of breath and wheezing.   Cardiovascular: Negative for palpitations and leg swelling.  Gastrointestinal: Negative for nausea and vomiting.  Genitourinary: Negative for dysuria.  Musculoskeletal: Negative for joint swelling.  Skin: Negative for rash.  Neurological: Negative for headaches.  Hematological: Does not bruise/bleed easily.  Psychiatric/Behavioral: Negative for dysphoric mood. The patient is not nervous/anxious.        Objective:   Physical Exam Well-developed male in no acute distress No skin breakdown or pressure necrosis from the CPAP mask Nose without discharge or purulence noted Lower extremities without significant edema, no cyanosis noted Alert, does not appear sleepy, moves all 4 extremities.       Assessment & Plan:

## 2010-09-12 NOTE — Assessment & Plan Note (Signed)
The patient is doing very well with his CPAP tolerance thus far.  He is sleeping better, and has seen improvement in his daytime alertness.  He has no issues with his mask fit or pressure tolerance.  At this point we need to increase his pressure toward his therapeutic level, and I have encouraged him to work on weight loss.

## 2010-09-12 NOTE — Patient Instructions (Signed)
Will have your cpap pressure increased to 12 for the next 2 weeks, then to 14.  Please call if having issues with tolerance Work on weight loss followup with me in 34mos.

## 2011-01-17 ENCOUNTER — Other Ambulatory Visit: Payer: Self-pay | Admitting: Cardiology

## 2011-03-13 ENCOUNTER — Ambulatory Visit: Payer: Medicare Other | Admitting: Pulmonary Disease

## 2011-03-27 ENCOUNTER — Ambulatory Visit (INDEPENDENT_AMBULATORY_CARE_PROVIDER_SITE_OTHER): Payer: Medicare Other | Admitting: Pulmonary Disease

## 2011-03-27 ENCOUNTER — Encounter: Payer: Self-pay | Admitting: Pulmonary Disease

## 2011-03-27 VITALS — BP 140/70 | HR 63 | Temp 97.7°F | Ht 70.5 in | Wt 219.6 lb

## 2011-03-27 DIAGNOSIS — G4733 Obstructive sleep apnea (adult) (pediatric): Secondary | ICD-10-CM

## 2011-03-27 NOTE — Assessment & Plan Note (Addendum)
The patient is wearing CPAP compliantly by his download, and feels that CPAP has helped with respect to his sleep and daytime alertness.  However, he continues to have some residual sleepiness, and this can occur in patients with severe sleep apnea even when compliant with CPAP.  I have asked him to stay on CPAP, and to work aggressively on weight loss at this point.

## 2011-03-27 NOTE — Progress Notes (Signed)
  Subjective:    Patient ID: Jeffery Fernandez, male    DOB: 01-02-1946, 66 y.o.   MRN: [id number]  HPI The patient comes in today for followup of his obstructive sleep apnea.  He is wearing CPAP compliantly at his optimal pressure, and feels that he is sleeping better.  He is not sure if it has helped his daytime alertness, but his wife believes that it has.  He still has some sleepiness at times during the day.  His only complaint with CPAP is a nocturnal cough, and he describes a tickle in his throat while wearing the CPAP.  He does have some postnasal drip, but denies having significant reflux.  It should be noted the patient is taking an ACE inhibitor.   Review of Systems  Constitutional: Negative for fever and unexpected weight change.  HENT: Negative for ear pain, nosebleeds, congestion, sore throat, rhinorrhea, sneezing, trouble swallowing, dental problem, postnasal drip and sinus pressure.   Eyes: Positive for redness and itching.  Respiratory: Positive for cough. Negative for chest tightness, shortness of breath and wheezing.   Cardiovascular: Positive for chest pain. Negative for palpitations and leg swelling.  Gastrointestinal: Negative for nausea and vomiting.  Genitourinary: Negative for dysuria.  Musculoskeletal: Positive for joint swelling.  Skin: Negative for rash.  Neurological: Negative for headaches.  Hematological: Bruises/bleeds easily.  Psychiatric/Behavioral: Negative for dysphoric mood. The patient is not nervous/anxious.        Objective:   Physical Exam Overweight male in no acute distress No skin breakdown or pressure necrosis from the CPAP mask Lower extremities without edema, no cyanosis Alert, does not appear to be sleepy, moves all 4 extremities.       Assessment & Plan:

## 2011-03-27 NOTE — Patient Instructions (Addendum)
Continue on cpap Can try chlorpheniramine 8mg  at bedtime to help with postnasal drip to see if cough with cpap gets better.  If it does not, would try prilosec over the counter at bedtime.  If cough continues, would discuss with your primary doctor coming off lisinopril, which can make you cough as well. Work on weight loss followup with me in one year if doing well.

## 2011-05-15 ENCOUNTER — Encounter: Payer: Self-pay | Admitting: Gastroenterology

## 2011-05-30 ENCOUNTER — Telehealth: Payer: Self-pay | Admitting: Pulmonary Disease

## 2011-05-30 DIAGNOSIS — G4733 Obstructive sleep apnea (adult) (pediatric): Secondary | ICD-10-CM

## 2011-05-30 NOTE — Telephone Encounter (Signed)
lmom for wife that cpap orders have been sent to [facility]

## 2011-05-30 NOTE — Telephone Encounter (Signed)
If I have seen any pt within a year, ok to send order for new supplies.

## 2011-05-30 NOTE — Telephone Encounter (Signed)
Spoke with pt's spouse Lelon Frohlich. She states that pt needs a new CPAP mask, his is leaking and the velcro is not sticking any longer. She states that she is needing this sent to new DME, b/c [facility] is no longer in network. Please advise if okay to send order to [facility], thanks!

## 2011-05-30 NOTE — Telephone Encounter (Signed)
Order has been sent to [facility]. Spouse would like to know who they get set up with. Please advise Roland thanks

## 2011-06-14 ENCOUNTER — Encounter: Payer: Self-pay | Admitting: Gastroenterology

## 2011-06-14 ENCOUNTER — Telehealth: Payer: Self-pay | Admitting: *Deleted

## 2011-06-14 ENCOUNTER — Ambulatory Visit (INDEPENDENT_AMBULATORY_CARE_PROVIDER_SITE_OTHER): Payer: Medicare Other | Admitting: Gastroenterology

## 2011-06-14 VITALS — BP 124/72 | HR 64 | Ht 70.0 in | Wt 215.5 lb

## 2011-06-14 DIAGNOSIS — Z860100 Personal history of colon polyps, unspecified: Secondary | ICD-10-CM | POA: Insufficient documentation

## 2011-06-14 MED ORDER — PEG-KCL-NACL-NASULF-NA ASC-C 100 G PO SOLR: 1.0000 | Freq: Once | ORAL | Status: DC

## 2011-06-14 NOTE — Patient Instructions (Signed)
Colonoscopy A colonoscopy is an exam to evaluate your entire colon. In this exam, your colon is cleansed. A long fiberoptic tube is inserted through your rectum and into your colon. The fiberoptic scope (endoscope) is a long bundle of enclosed and very flexible fibers. These fibers transmit light to the area examined and send images from that area to your caregiver. Discomfort is usually minimal. You may be given a drug to help you sleep (sedative) during or prior to the procedure. This exam helps to detect lumps (tumors), polyps, inflammation, and areas of bleeding. Your caregiver may also take a small piece of tissue (biopsy) that will be examined under a microscope. LET YOUR CAREGIVER KNOW ABOUT:   Allergies to food or medicine.   Medicines taken, including vitamins, herbs, eyedrops, over-the-counter medicines, and creams.   Use of steroids (by mouth or creams).   Previous problems with anesthetics or numbing medicines.   History of bleeding problems or blood clots.   Previous surgery.   Other health problems, including diabetes and kidney problems.   Possibility of pregnancy, if this applies.  BEFORE THE PROCEDURE   A clear liquid diet may be required for 2 days before the exam.   Ask your caregiver about changing or stopping your regular medications.   Liquid injections (enemas) or laxatives may be required.   A large amount of electrolyte solution may be given to you to drink over a short period of time. This solution is used to clean out your colon.   You should be present 60 minutes prior to your procedure or as directed by your caregiver.  AFTER THE PROCEDURE   If you received a sedative or pain relieving medication, you will need to arrange for someone to drive you home.   Occasionally, there is a little blood passed with the first bowel movement. Do not be concerned.  FINDING OUT THE RESULTS OF YOUR TEST Not all test results are available during your visit. If your test  results are not back during the visit, make an appointment with your caregiver to find out the results. Do not assume everything is normal if you have not heard from your caregiver or the medical facility. It is important for you to follow up on all of your test results. HOME CARE INSTRUCTIONS   It is not unusual to pass moderate amounts of gas and experience mild abdominal cramping following the procedure. This is due to air being used to inflate your colon during the exam. Walking or a warm pack on your belly (abdomen) may help.   You may resume all normal meals and activities after sedatives and medicines have worn off.   Only take over-the-counter or prescription medicines for pain, discomfort, or fever as directed by your caregiver. Do not use aspirin or blood thinners if a biopsy was taken. Consult your caregiver for medicine usage if biopsies were taken.  SEEK IMMEDIATE MEDICAL CARE IF:   You have a fever.   You pass large blood clots or fill a toilet with blood following the procedure. This may also occur 10 to 14 days following the procedure. This is more likely if a biopsy was taken.   You develop abdominal pain that keeps getting worse and cannot be relieved with medicine.  Document Released: 01/12/2000 Document Revised: 01/03/2011 Document Reviewed: 08/27/2007 Exit[employer] Patient Information 2012 [employer]. 

## 2011-06-14 NOTE — Telephone Encounter (Signed)
[facility] [address] ,[place] [address] [phone] Phone [phone]   Fax  06/14/2011    RE: [name] DOB: 06/22/1945 MRN: [id number]   Dear Dr Dione Housekeeper,    We have scheduled the above patient for an endoscopic procedure. Our records show that he is on anticoagulation therapy.   Please advise as to how long the patient may come off his therapy of Plavix prior to the procedure, which is scheduled for 07/09/2011.  Please fax back/ or route the completed form to [facility] at [phone].   Sincerely,  Genella Mech

## 2011-06-14 NOTE — Assessment & Plan Note (Signed)
Plan followup colonoscopy. Plan to hold Plavix if this is approved by Dr. Edrick Oh

## 2011-06-14 NOTE — Progress Notes (Signed)
History of Present Illness:  Jeffery Fernandez is a pleasant 66 year old white male referred at the request of Dr. Edrick Oh for colonoscopy. In 2007 an adenomatous colon polyp was removed. He has no GI complaints including change of bowel habits, abdominal pain, melena or hematochezia. He is on Plavix.    Past Medical History  Diagnosis Date  . Hypertension   . Arrhythmia   . Chest pain, unspecified   . Shortness of breath   . Unspecified sleep apnea   . Other left bundle branch block   . Obstructive sleep apnea (adult) (pediatric)   . Chronic airway obstruction, not elsewhere classified   . Other and unspecified hyperlipidemia   . Retinal vascular occlusion, unspecified     right  . Diabetes mellitus   . Nephrolithiasis   . Colon polyps     Adenomatous Polyps 2007  . Diverticulosis   . Depression    Past Surgical History  Procedure Date  . Kidney stone surgery    family history includes Diabetes in his brother; Heart attack (age of onset:60) in his brother; Heart disease in his father; Rheum arthritis in his cousin; and Uterine cancer in his mother. Current Outpatient Prescriptions  Medication Sig Dispense Refill  . aspirin 81 MG tablet Take 81 mg by mouth daily.        Marland Kitchen atorvastatin (LIPITOR) 10 MG tablet Take 40 mg by mouth daily.        . clopidogrel (PLAVIX) 75 MG tablet Take 75 mg by mouth daily.        Marland Kitchen glipiZIDE (GLUCOTROL) 10 MG tablet Take 10 mg by mouth daily.       Marland Kitchen lisinopril-hydrochlorothiazide (PRINZIDE,ZESTORETIC) 20-12.5 MG per tablet       . losartan (COZAAR) 50 MG tablet Take 50 mg by mouth daily.      . metFORMIN (GLUCOPHAGE) 500 MG tablet Take 500 mg by mouth 2 (two) times daily with a meal.        . metoprolol (LOPRESSOR) 50 MG tablet Take 1 tablet (50 mg total) by mouth 2 (two) times daily.      . sertraline (ZOLOFT) 50 MG tablet Take 50 mg by mouth daily.        . sitaGLIPtan (JANUVIA) 100 MG tablet Take 100 mg by mouth daily.        Marland Kitchen terazosin (HYTRIN) 2 MG  capsule Take 2 mg by mouth at bedtime.        Marland Kitchen DISCONTD: lisinopril-hydrochlorothiazide (PRINZIDE,ZESTORETIC) 20-12.5 MG per tablet TAKE 2 TABLETS BY MOUTH DAILY  60 tablet  4   Allergies as of 06/14/2011 - Review Complete 06/14/2011  Allergen Reaction Noted  . Penicillins Other (See Comments) 06/08/2010    reports that he quit smoking about 25 years ago. His smoking use included Cigarettes. He has a 37.5 pack-year smoking history. He has quit using smokeless tobacco. He reports that he does not drink alcohol or use illicit drugs.     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 All other review of systems were otherwise negative.  Vital signs were reviewed in today's medical record Physical Exam: General: Well developed , well nourished, no acute distress Head: Normocephalic and atraumatic Eyes:  sclerae anicteric, EOMI Ears: Normal auditory acuity Mouth: No deformity or lesions Neck: Supple, no masses or thyromegaly Lungs: Clear throughout to  auscultation Heart: Regular rate and rhythm; no murmurs, rubs or bruits Abdomen: Soft, non tender and non distended. No masses, hepatosplenomegaly or hernias noted. Normal Bowel sounds Rectal:deferred Musculoskeletal: Symmetrical with no gross deformities  Skin: No lesions on visible extremities Pulses:  Normal pulses noted Extremities: No clubbing, cyanosis, edema or deformities noted Neurological: Alert oriented x 4, grossly nonfocal Cervical Nodes:  No significant cervical adenopathy Inguinal Nodes: No significant inguinal adenopathy Psychological:  Alert and cooperative. Normal mood and affect

## 2011-06-17 NOTE — Telephone Encounter (Signed)
Patient can hold Plavix 4 days prior to procedure , per faxed document that will be scanned in Infromed Patient to hold 4 days prior

## 2011-06-21 ENCOUNTER — Other Ambulatory Visit: Payer: Self-pay | Admitting: Cardiology

## 2011-06-21 NOTE — Telephone Encounter (Signed)
..   Requested Prescriptions   Pending Prescriptions Disp Refills  . lisinopril-hydrochlorothiazide (PRINZIDE,ZESTORETIC) 20-12.5 MG per tablet [Pharmacy Med Name: LISINOPRIL-HCTZ 20-12.5 MG TAB] 60 tablet 1    Sig: TAKE 2 TABLETS BY MOUTH DAILY

## 2011-07-02 ENCOUNTER — Encounter: Payer: Self-pay | Admitting: *Deleted

## 2011-07-02 ENCOUNTER — Telehealth: Payer: Self-pay | Admitting: Pulmonary Disease

## 2011-07-02 DIAGNOSIS — G4733 Obstructive sleep apnea (adult) (pediatric): Secondary | ICD-10-CM

## 2011-07-02 NOTE — Telephone Encounter (Signed)
I spoke with spouse and she states pt was never given a new cpap mask/head gear in feb and would like new order sent to [facility] for this. i have sent order and nothing further was needed

## 2011-07-09 ENCOUNTER — Ambulatory Visit (AMBULATORY_SURGERY_CENTER): Payer: Medicare Other | Admitting: Gastroenterology

## 2011-07-09 ENCOUNTER — Encounter: Payer: Self-pay | Admitting: Gastroenterology

## 2011-07-09 VITALS — BP 141/76 | HR 68 | Temp 97.0°F | Resp 20 | Ht 70.0 in | Wt 215.0 lb

## 2011-07-09 DIAGNOSIS — D126 Benign neoplasm of colon, unspecified: Secondary | ICD-10-CM

## 2011-07-09 DIAGNOSIS — K5730 Diverticulosis of large intestine without perforation or abscess without bleeding: Secondary | ICD-10-CM

## 2011-07-09 DIAGNOSIS — Z860100 Personal history of colon polyps, unspecified: Secondary | ICD-10-CM

## 2011-07-09 LAB — GLUCOSE, CAPILLARY
Glucose-Capillary: 90 mg/dL (ref 70–99)
Glucose-Capillary: 90 mg/dL (ref 70–99)

## 2011-07-09 MED ORDER — SODIUM CHLORIDE 0.9 % IV SOLN: 500.0000 mL | INTRAVENOUS | Status: DC

## 2011-07-09 NOTE — Patient Instructions (Addendum)
Resume plavix in 5 daysYOU HAD AN ENDOSCOPIC PROCEDURE TODAY AT [facility]: Refer to the procedure report that was given to you for any specific questions about what was found during the examination.  If the procedure report does not answer your questions, please call your gastroenterologist to clarify.  If you requested that your care partner not be given the details of your procedure findings, then the procedure report has been included in a sealed envelope for you to review at your convenience later.  YOU SHOULD EXPECT: Some feelings of bloating in the abdomen. Passage of more gas than usual.  Walking can help get rid of the air that was put into your GI tract during the procedure and reduce the bloating. If you had a lower endoscopy (such as a colonoscopy or flexible sigmoidoscopy) you may notice spotting of blood in your stool or on the toilet paper. If you underwent a bowel prep for your procedure, then you may not have a normal bowel movement for a few days.  DIET: Your first meal following the procedure should be a light meal and then it is ok to progress to your normal diet.  A half-sandwich or bowl of soup is an example of a good first meal.  Heavy or fried foods are harder to digest and may make you feel nauseous or bloated.  Likewise meals heavy in dairy and vegetables can cause extra gas to form and this can also increase the bloating.  Drink plenty of fluids but you should avoid alcoholic beverages for 24 hours.  ACTIVITY: Your care partner should take you home directly after the procedure.  You should plan to take it easy, moving slowly for the rest of the day.  You can resume normal activity the day after the procedure however you should NOT DRIVE or use heavy machinery for 24 hours (because of the sedation medicines used during the test).    SYMPTOMS TO REPORT IMMEDIATELY: A gastroenterologist can be reached at any hour.  During normal business hours, 8:30 AM to 5:00 PM  Monday through Friday, call [phone].  After hours and on weekends, please call the GI answering service at [phone] who will take a message and have the physician on call contact you.   Following lower endoscopy (colonoscopy or flexible sigmoidoscopy):  Excessive amounts of blood in the stool  Significant tenderness or worsening of abdominal pains  Swelling of the abdomen that is new, acute  Fever of 100F or higher  FOLLOW UP: If any biopsies were taken you will be contacted by phone or by letter within the next 1-3 weeks.  Call your gastroenterologist if you have not heard about the biopsies in 3 weeks.  Our staff will call the home number listed on your records the next business day following your procedure to check on you and address any questions or concerns that you may have at that time regarding the information given to you following your procedure. This is a courtesy call and so if there is no answer at the home number and we have not heard from you through the emergency physician on call, we will assume that you have returned to your regular daily activities without incident.  SIGNATURES/CONFIDENTIALITY: You and/or your care partner have signed paperwork which will be entered into your electronic medical record.  These signatures attest to the fact that that the information above on your After Visit Summary has been reviewed and is understood.  Full responsibility of  the confidentiality of this discharge information lies with you and/or your care-partner.

## 2011-07-09 NOTE — Progress Notes (Signed)
Patient did not have preoperative order for IV antibiotic SSI prophylaxis. (G8918)  Patient did not experience any of the following events: a burn prior to discharge; a fall within the facility; wrong site/side/patient/procedure/implant event; or a hospital transfer or hospital admission upon discharge from the facility. (G8907)  

## 2011-07-09 NOTE — Op Note (Signed)
[facility] [address] [place], [place]  [id number]  COLONOSCOPY PROCEDURE REPORT  PATIENT:  Jeffery Fernandez, Jeffery Fernandez  MR#:  [id number] BIRTHDATE:  1945/08/27, 66 yrs. old  GENDER:  male ENDOSCOPIST:  Sandy Salaam. Deatra Ina, MD REF. BY:  Dione Housekeeper, M.D. PROCEDURE DATE:  07/09/2011 PROCEDURE:  Colonoscopy with snare polypectomy ASA CLASS:  Class II INDICATIONS:  Screening, history of pre-cancerous (adenomatous) colon polyps Index polypectomy 2007 MEDICATIONS:   MAC sedation, administered by CRNA propofol 100mg IV  DESCRIPTION OF PROCEDURE:   After the risks benefits and alternatives of the procedure were thoroughly explained, informed consent was obtained.  Digital rectal exam was performed and revealed no abnormalities.   The LB [id number] endoscope was introduced through the anus and advanced to the cecum, which was identified by the ileocecal valve, without limitations.  The quality of the prep was good, using MoviPrep.  The instrument was then slowly withdrawn as the colon was fully examined. <<PROCEDUREIMAGES>>  FINDINGS:  A sessile polyp was found in the sigmoid colon. It was 3 mm in size. It was found 19 cm from the point of entry. Polyp was snared without cautery. Retrieval was successful (see image9). snare polyp  Severe diverticulosis was found in the sigmoid colon (see image1).  Moderate diverticulosis was found (see image8). Descending colon to cecum  This was otherwise a normal examination of the colon (see image6 and image11).   Retroflexed views in the rectum revealed no abnormalities.    The time to cecum =  1) 1.75 minutes. The scope was then withdrawn in  1) 8.25  minutes from the cecum and the procedure completed. COMPLICATIONS:  None ENDOSCOPIC IMPRESSION: 1) 3 mm sessile polyp in the sigmoid colon 2) Severe diverticulosis in the sigmoid colon 3) Moderate diverticulosis 4) Otherwise normal examination RECOMMENDATIONS: 1) If the polyp(s) removed today  are proven to be adenomatous (pre-cancerous) polyps, you will need a repeat colonoscopy in 5 years. Otherwise you should continue to follow colorectal cancer screening guidelines for "routine risk" patients with colonoscopy in 10 years. You will receive a letter within 1-2 weeks with the results of your biopsy as well as final recommendations. Please call my office if you have not received a letter after 3 weeks. REPEAT EXAM:   You will receive a letter from Dr. Deatra Ina in 1-2 weeks, after reviewing the final pathology, with followup recommendations.  ______________________________ Sandy Salaam Deatra Ina, MD  CC:  n. eSIGNED:   Sandy Salaam. Jrake Rodriquez at 07/09/2011 03:29 PM  Waverly Ferrari, [id number]

## 2011-07-10 ENCOUNTER — Telehealth: Payer: Self-pay

## 2011-07-10 NOTE — Telephone Encounter (Signed)
  Follow up Call-  Call back number 07/09/2011  Post procedure Call Back phone  # [phone]  Permission to leave phone message Yes     Patient questions:  Do you have a fever, pain , or abdominal swelling? no Pain Score  0 *  Have you tolerated food without any problems? no  Have you been able to return to your normal activities? yes  Do you have any questions about your discharge instructions: Diet   no Medications  no Follow up visit  no  Do you have questions or concerns about your Care? no  Actions: * If pain score is 4 or above: No action needed, pain <4.

## 2011-07-15 ENCOUNTER — Encounter: Payer: Self-pay | Admitting: Gastroenterology

## 2011-08-14 ENCOUNTER — Other Ambulatory Visit: Payer: Self-pay | Admitting: Cardiology

## 2011-11-25 ENCOUNTER — Other Ambulatory Visit: Payer: Self-pay | Admitting: Cardiology

## 2012-02-10 ENCOUNTER — Other Ambulatory Visit: Payer: Self-pay | Admitting: *Deleted

## 2012-02-10 MED ORDER — LISINOPRIL-HYDROCHLOROTHIAZIDE 20-12.5 MG PO TABS: 2.0000 | ORAL_TABLET | Freq: Every day | ORAL | Status: DC

## 2012-03-30 ENCOUNTER — Ambulatory Visit (INDEPENDENT_AMBULATORY_CARE_PROVIDER_SITE_OTHER): Payer: Medicare Other | Admitting: Pulmonary Disease

## 2012-03-30 ENCOUNTER — Other Ambulatory Visit: Payer: Self-pay | Admitting: Pulmonary Disease

## 2012-03-30 ENCOUNTER — Encounter: Payer: Self-pay | Admitting: Pulmonary Disease

## 2012-03-30 VITALS — BP 140/84 | HR 70 | Temp 97.5°F | Ht 70.0 in | Wt 219.6 lb

## 2012-03-30 DIAGNOSIS — G4733 Obstructive sleep apnea (adult) (pediatric): Secondary | ICD-10-CM

## 2012-03-30 NOTE — Patient Instructions (Addendum)
Will send an order to [facility] to get you new mask and supplies. Work on weight loss If you continue to feel that your sleep and alertness is not where it needs to be after getting new mask, let me know and we can re-optimize your pressure at home. followup with me in one year.

## 2012-03-30 NOTE — Progress Notes (Signed)
  Subjective:    Patient ID: Jeffery Fernandez, male    DOB: 10-Feb-1945, 67 y.o.   MRN: [id number]  HPI The patient comes in today for followup of his known obstructive sleep apnea.  He is wearing CPAP compliantly, but is overdue for a new mask and supplies.  Overall, he feels that he sleeps well with adequate daytime alertness, but does have an issue with sleepiness some mornings.  His weight has not changed since the last visit, and therefore I wonder if it is his mask is excessively leaking.  His download shows excellent compliance, and what appears to be adequate control of his AHI.  He is still having occasional tickle/cough but wearing CPAP, but he is also still on an ACE inhibitor.  The patient does not feel this is that big of an issue for him currently.   Review of Systems  Constitutional: Negative for fever and unexpected weight change.  HENT: Negative for ear pain, nosebleeds, congestion, sore throat, rhinorrhea, sneezing, trouble swallowing, dental problem, postnasal drip and sinus pressure.   Eyes: Negative for redness and itching.  Respiratory: Positive for cough ( with CPAP---causing tickle in throat) and shortness of breath (while sitting at times, pt feels he has to take a "deep breath"..."gasping"). Negative for chest tightness and wheezing.   Cardiovascular: Negative for palpitations and leg swelling.  Gastrointestinal: Negative for nausea and vomiting.  Genitourinary: Negative for dysuria.  Musculoskeletal: Negative for joint swelling.  Skin: Negative for rash.  Neurological: Negative for headaches.  Hematological: Does not bruise/bleed easily.  Psychiatric/Behavioral: Negative for dysphoric mood. The patient is not nervous/anxious.        Objective:   Physical Exam Overweight male in no acute distress Nose with purulent discharge noted No skin breakdown or pressure necrosis from CPAP mask Lower extremities with no significant edema, cyanosis Alert and oriented, does not  appear to be sleepy, moves all 4 extremities.       Assessment & Plan:

## 2012-03-30 NOTE — Assessment & Plan Note (Signed)
The patient has been wearing CPAP compliantly, he feels he is still benefiting from treatment.  He is having some increase in symptoms, but admits that he is overdue for a mask and supplies.  If it does not improve his current symptoms, he is to let us know so that we can optimize his pressure again.  Also encouraged him to work aggressively on weight loss.

## 2012-04-08 ENCOUNTER — Other Ambulatory Visit: Payer: Self-pay | Admitting: *Deleted

## 2012-04-08 MED ORDER — LISINOPRIL-HYDROCHLOROTHIAZIDE 20-12.5 MG PO TABS: 2.0000 | ORAL_TABLET | Freq: Every day | ORAL | Status: DC

## 2012-05-31 ENCOUNTER — Other Ambulatory Visit: Payer: Self-pay | Admitting: Cardiology

## 2013-03-30 ENCOUNTER — Encounter: Payer: Self-pay | Admitting: Pulmonary Disease

## 2013-03-30 ENCOUNTER — Ambulatory Visit (INDEPENDENT_AMBULATORY_CARE_PROVIDER_SITE_OTHER): Payer: Medicare Other | Admitting: Pulmonary Disease

## 2013-03-30 VITALS — BP 150/92 | HR 59 | Temp 97.3°F | Ht 69.5 in | Wt 216.4 lb

## 2013-03-30 DIAGNOSIS — G4733 Obstructive sleep apnea (adult) (pediatric): Secondary | ICD-10-CM

## 2013-03-30 NOTE — Patient Instructions (Signed)
Continue with cpap, and we will send an order to your home care company to show you different masks that may be more comfortable.  Keep working on weight loss followup with me again in one year, but call if you are not satisfied with how things are going with cpap.

## 2013-03-30 NOTE — Progress Notes (Signed)
   Subjective:    Patient ID: Jeffery Fernandez, male    DOB: May 12, 1945, 68 y.o.   MRN: [id number]  HPI The patient comes in today for followup of his obstructive sleep apnea. He is wearing CPAP compliantly, and feels that he is sleeping well with the device with excellent daytime alertness. His only complaint today is that of a poorly fitting mask, although he has not kept up with the mask cushions. He would like to look at other types of mask to see if they are more comfortable. The patient has lost 3 pounds since the last visit.   Review of Systems  Constitutional: Negative for fever and unexpected weight change.  HENT: Negative for congestion, dental problem, ear pain, nosebleeds, postnasal drip, rhinorrhea, sinus pressure, sneezing, sore throat and trouble swallowing.   Eyes: Negative for redness and itching.  Respiratory: Negative for cough, chest tightness, shortness of breath and wheezing.   Cardiovascular: Negative for palpitations and leg swelling.  Gastrointestinal: Negative for nausea and vomiting.  Genitourinary: Negative for dysuria.  Musculoskeletal: Negative for joint swelling.  Skin: Negative for rash.  Neurological: Negative for headaches.  Hematological: Does not bruise/bleed easily.  Psychiatric/Behavioral: Negative for dysphoric mood. The patient is not nervous/anxious.        Objective:   Physical Exam Overweight male in no acute distress Nose without purulence or discharge noted No skin breakdown or pressure necrosis from the CPAP mask Neck without lymphadenopathy or thyromegaly Lower extremities without edema, no cyanosis Alert and oriented, moves all 4 extremities.       Assessment & Plan:

## 2013-03-30 NOTE — Assessment & Plan Note (Signed)
The patient is wearing CPAP compliantly, and has done very well with the device. His only complaint today is that of a poorly fitting mask, but he has not kept up with his cushion changes. I will have his home care company show him different types of masks, and he is to call if he is unsuccessful in finding a good fit. Otherwise, he will continue on CPAP and keep working on aggressive weight loss. I will see him back in one year.

## 2013-06-09 DIAGNOSIS — D631 Anemia in chronic kidney disease: Secondary | ICD-10-CM | POA: Insufficient documentation

## 2014-03-31 ENCOUNTER — Ambulatory Visit: Payer: Medicare Other | Admitting: Pulmonary Disease

## 2014-04-25 ENCOUNTER — Encounter: Payer: Self-pay | Admitting: Pulmonary Disease

## 2014-04-25 ENCOUNTER — Ambulatory Visit (INDEPENDENT_AMBULATORY_CARE_PROVIDER_SITE_OTHER): Payer: Medicare Other | Admitting: Pulmonary Disease

## 2014-04-25 ENCOUNTER — Encounter (INDEPENDENT_AMBULATORY_CARE_PROVIDER_SITE_OTHER): Payer: Self-pay

## 2014-04-25 VITALS — BP 160/74 | HR 64 | Temp 97.0°F | Ht 70.5 in | Wt 217.2 lb

## 2014-04-25 DIAGNOSIS — G4733 Obstructive sleep apnea (adult) (pediatric): Secondary | ICD-10-CM

## 2014-04-25 NOTE — Assessment & Plan Note (Signed)
The patient continues to do well with C Pap, but needs to keep up with his mask cushion changes more frequently. I have asked him to continue on his device, and to work more aggressively on weight reduction. I will see him back in one year if doing well.

## 2014-04-25 NOTE — Patient Instructions (Signed)
Stay on cpap, and keep up with mask changes and supplies Work on weight loss followup with me again in one year if doing well.

## 2014-04-25 NOTE — Progress Notes (Signed)
   Subjective:    Patient ID: Jeffery Fernandez, male    DOB: November 08, 1945, 70 y.o.   MRN: [id number]  HPI The patient comes in today for follow-up of his obstructive sleep apnea. He is wearing his device fairly compliantly, but has had issues with a new puppy attacking his equipment while he is trying to sleep. He feels that he sleeps well with the device, and is satisfied with his daytime alertness. He has not kept up with his new mask cushion changes on a regular basis.   Review of Systems  Constitutional: Negative for fever and unexpected weight change.  HENT: Negative for congestion, dental problem, ear pain, nosebleeds, postnasal drip, rhinorrhea, sinus pressure, sneezing, sore throat and trouble swallowing.   Eyes: Negative for redness and itching.  Respiratory: Negative for cough, chest tightness, shortness of breath and wheezing.   Cardiovascular: Negative for palpitations and leg swelling.  Gastrointestinal: Negative for nausea and vomiting.  Genitourinary: Negative for dysuria.  Musculoskeletal: Negative for joint swelling.  Skin: Negative for rash.  Neurological: Negative for headaches.  Hematological: Does not bruise/bleed easily.  Psychiatric/Behavioral: Negative for dysphoric mood. The patient is not nervous/anxious.        Objective:   Physical Exam Overweight male in no acute distress Nose without purulence or discharge noted No skin breakdown or pressure necrosis from the C Pap mask Neck without lymphadenopathy or thyromegaly Lower extremities without edema, no cyanosis Alert and oriented, moves all 4 extremities.       Assessment & Plan:

## 2014-11-07 ENCOUNTER — Ambulatory Visit
Admission: RE | Admit: 2014-11-07 | Discharge: 2014-11-07 | Disposition: A | Discharge status: Home / Self Care | Payer: Medicare Other | Source: Ambulatory Visit | Attending: Family Medicine | Admitting: Family Medicine

## 2014-11-07 ENCOUNTER — Other Ambulatory Visit: Payer: Self-pay | Admitting: Family Medicine

## 2014-11-07 DIAGNOSIS — M79671 Pain in right foot: Secondary | ICD-10-CM

## 2014-11-12 ENCOUNTER — Encounter (HOSPITAL_COMMUNITY): Payer: Self-pay | Admitting: Vascular Surgery

## 2014-11-12 ENCOUNTER — Emergency Department (HOSPITAL_COMMUNITY)
Admission: EM | Admit: 2014-11-12 | Discharge: 2014-11-13 | Disposition: A | Discharge status: Home / Self Care | Payer: Medicare Other | Attending: Emergency Medicine | Admitting: Emergency Medicine

## 2014-11-12 ENCOUNTER — Emergency Department (HOSPITAL_COMMUNITY): Payer: Medicare Other

## 2014-11-12 DIAGNOSIS — Z8669 Personal history of other diseases of the nervous system and sense organs: Secondary | ICD-10-CM | POA: Diagnosis not present

## 2014-11-12 DIAGNOSIS — J449 Chronic obstructive pulmonary disease, unspecified: Secondary | ICD-10-CM | POA: Insufficient documentation

## 2014-11-12 DIAGNOSIS — Z87891 Personal history of nicotine dependence: Secondary | ICD-10-CM | POA: Diagnosis not present

## 2014-11-12 DIAGNOSIS — E785 Hyperlipidemia, unspecified: Secondary | ICD-10-CM | POA: Insufficient documentation

## 2014-11-12 DIAGNOSIS — Z880 Allergy status to penicillin: Secondary | ICD-10-CM | POA: Diagnosis not present

## 2014-11-12 DIAGNOSIS — Z86010 Personal history of colonic polyps: Secondary | ICD-10-CM | POA: Diagnosis not present

## 2014-11-12 DIAGNOSIS — Z7901 Long term (current) use of anticoagulants: Secondary | ICD-10-CM | POA: Insufficient documentation

## 2014-11-12 DIAGNOSIS — Z7902 Long term (current) use of antithrombotics/antiplatelets: Secondary | ICD-10-CM | POA: Diagnosis not present

## 2014-11-12 DIAGNOSIS — Z87442 Personal history of urinary calculi: Secondary | ICD-10-CM | POA: Insufficient documentation

## 2014-11-12 DIAGNOSIS — Z7982 Long term (current) use of aspirin: Secondary | ICD-10-CM | POA: Diagnosis not present

## 2014-11-12 DIAGNOSIS — E119 Type 2 diabetes mellitus without complications: Secondary | ICD-10-CM | POA: Insufficient documentation

## 2014-11-12 DIAGNOSIS — Z8719 Personal history of other diseases of the digestive system: Secondary | ICD-10-CM | POA: Diagnosis not present

## 2014-11-12 DIAGNOSIS — F329 Major depressive disorder, single episode, unspecified: Secondary | ICD-10-CM | POA: Insufficient documentation

## 2014-11-12 DIAGNOSIS — M542 Cervicalgia: Secondary | ICD-10-CM | POA: Insufficient documentation

## 2014-11-12 DIAGNOSIS — I10 Essential (primary) hypertension: Secondary | ICD-10-CM | POA: Diagnosis not present

## 2014-11-12 DIAGNOSIS — Z79899 Other long term (current) drug therapy: Secondary | ICD-10-CM | POA: Insufficient documentation

## 2014-11-12 HISTORY — DX: Gout, unspecified: M10.9

## 2014-11-12 LAB — CBC WITH DIFFERENTIAL/PLATELET
BASOS PCT: 1 %
Basophils Absolute: 0 10*3/uL (ref 0.0–0.1)
EOS ABS: 0.2 10*3/uL (ref 0.0–0.7)
EOS PCT: 3 %
HCT: 42.3 % (ref 39.0–52.0)
HEMOGLOBIN: 14.4 g/dL (ref 13.0–17.0)
Lymphocytes Relative: 26 %
Lymphs Abs: 2.1 10*3/uL (ref 0.7–4.0)
MCH: 30.6 pg (ref 26.0–34.0)
MCHC: 34 g/dL (ref 30.0–36.0)
MCV: 89.8 fL (ref 78.0–100.0)
Monocytes Absolute: 0.5 10*3/uL (ref 0.1–1.0)
Monocytes Relative: 6 %
NEUTROS PCT: 64 %
Neutro Abs: 5.2 10*3/uL (ref 1.7–7.7)
PLATELETS: 180 10*3/uL (ref 150–400)
RBC: 4.71 MIL/uL (ref 4.22–5.81)
RDW: 13.2 % (ref 11.5–15.5)
WBC: 8 10*3/uL (ref 4.0–10.5)

## 2014-11-12 LAB — COMPREHENSIVE METABOLIC PANEL
ALK PHOS: 78 U/L (ref 38–126)
ALT: 28 U/L (ref 17–63)
ANION GAP: 10 (ref 5–15)
AST: 25 U/L (ref 15–41)
Albumin: 3.7 g/dL (ref 3.5–5.0)
BILIRUBIN TOTAL: 0.6 mg/dL (ref 0.3–1.2)
BUN: 16 mg/dL (ref 6–20)
CALCIUM: 9 mg/dL (ref 8.9–10.3)
CO2: 29 mmol/L (ref 22–32)
CREATININE: 1.34 mg/dL — AB (ref 0.61–1.24)
Chloride: 100 mmol/L — ABNORMAL LOW (ref 101–111)
GFR, EST AFRICAN AMERICAN: 60 mL/min — AB (ref >60)
GFR, EST NON AFRICAN AMERICAN: 52 mL/min — AB (ref >60)
Glucose, Bld: 98 mg/dL (ref 65–99)
Potassium: 3.5 mmol/L (ref 3.5–5.1)
Sodium: 139 mmol/L (ref 135–145)
TOTAL PROTEIN: 7.2 g/dL (ref 6.5–8.1)

## 2014-11-12 LAB — TROPONIN I: Troponin I: <0.03 ng/mL (ref <0.031)

## 2014-11-12 MED ORDER — HYDRALAZINE HCL 25 MG PO TABS: 25.0000 mg | ORAL_TABLET | Freq: Three times a day (TID) | ORAL | Status: DC

## 2014-11-12 MED ORDER — HYDRALAZINE HCL 50 MG PO TABS
50.0000 mg | ORAL_TABLET | Freq: Once | ORAL | Status: AC
  Administered 2014-11-12: 50 mg via ORAL
  Filled 2014-11-12: qty 1

## 2014-11-12 NOTE — Discharge Instructions (Signed)
Hypertension Hypertension, commonly called high blood pressure, is when the force of blood pumping through your arteries is too strong. Your arteries are the blood vessels that carry blood from your heart throughout your body. A blood pressure reading consists of a higher number over a lower number, such as 110/72. The higher number (systolic) is the pressure inside your arteries when your heart pumps. The lower number (diastolic) is the pressure inside your arteries when your heart relaxes. Ideally you want your blood pressure below 120/80. Hypertension forces your heart to work harder to pump blood. Your arteries may become narrow or stiff. Having untreated or uncontrolled hypertension can cause heart attack, stroke, kidney disease, and other problems. RISK FACTORS Some risk factors for high blood pressure are controllable. Others are not.  Risk factors you cannot control include:   Race. You may be at higher risk if you are African American.  Age. Risk increases with age.  Gender. Men are at higher risk than women before age 45 years. After age 65, women are at higher risk than men. Risk factors you can control include:  Not getting enough exercise or physical activity.  Being overweight.  Getting too much fat, sugar, calories, or salt in your diet.  Drinking too much alcohol. SIGNS AND SYMPTOMS Hypertension does not usually cause signs or symptoms. Extremely high blood pressure (hypertensive crisis) may cause headache, anxiety, shortness of breath, and nosebleed. DIAGNOSIS To check if you have hypertension, your health care provider will measure your blood pressure while you are seated, with your arm held at the level of your heart. It should be measured at least twice using the same arm. Certain conditions can cause a difference in blood pressure between your right and left arms. A blood pressure reading that is higher than normal on one occasion does not mean that you need treatment. If  it is not clear whether you have high blood pressure, you may be asked to return on a different day to have your blood pressure checked again. Or, you may be asked to monitor your blood pressure at home for 1 or more weeks. TREATMENT Treating high blood pressure includes making lifestyle changes and possibly taking medicine. Living a healthy lifestyle can help lower high blood pressure. You may need to change some of your habits. Lifestyle changes may include:  Following the DASH diet. This diet is high in fruits, vegetables, and whole grains. It is low in salt, red meat, and added sugars.  Keep your sodium intake below 2,300 mg per day.  Getting at least 30-45 minutes of aerobic exercise at least 4 times per week.  Losing weight if necessary.  Not smoking.  Limiting alcoholic beverages.  Learning ways to reduce stress. Your health care provider may prescribe medicine if lifestyle changes are not enough to get your blood pressure under control, and if one of the following is true:  You are 18-59 years of age and your systolic blood pressure is above 140.  You are 60 years of age or older, and your systolic blood pressure is above 150.  Your diastolic blood pressure is above 90.  You have diabetes, and your systolic blood pressure is over 140 or your diastolic blood pressure is over 90.  You have kidney disease and your blood pressure is above 140/90.  You have heart disease and your blood pressure is above 140/90. Your personal target blood pressure may vary depending on your medical conditions, your age, and other factors. HOME CARE INSTRUCTIONS    Have your blood pressure rechecked as directed by your health care provider.   Take medicines only as directed by your health care provider. Follow the directions carefully. Blood pressure medicines must be taken as prescribed. The medicine does not work as well when you skip doses. Skipping doses also puts you at risk for  problems.  Do not smoke.   Monitor your blood pressure at home as directed by your health care provider. SEEK MEDICAL CARE IF:   You think you are having a reaction to medicines taken.  You have recurrent headaches or feel dizzy.  You have swelling in your ankles.  You have trouble with your vision. SEEK IMMEDIATE MEDICAL CARE IF:  You develop a severe headache or confusion.  You have unusual weakness, numbness, or feel faint.  You have severe chest or abdominal pain.  You vomit repeatedly.  You have trouble breathing. MAKE SURE YOU:   Understand these instructions.  Will watch your condition.  Will get help right away if you are not doing well or get worse.   This information is not intended to replace advice given to you by your health care provider. Make sure you discuss any questions you have with your health care provider.   Document Released: 01/14/2005 Document Revised: 05/31/2014 Document Reviewed: 11/06/2012 Elsevier Interactive Patient Education 2016 Else[employer] Muscle Cramps and Spasms Muscle cramps and spasms occur when a muscle or muscles tighten and you have no control over this tightening (involuntary muscle contraction). They are a common problem and can develop in any muscle. The most common place is in the calf muscles of the leg. Both muscle cramps and muscle spasms are involuntary muscle contractions, but they also have differences:   Muscle cramps are sporadic and painful. They may last a few seconds to a quarter of an hour. Muscle cramps are often more forceful and last longer than muscle spasms.  Muscle spasms may or may not be painful. They may also last just a few seconds or much longer. CAUSES  It is uncommon for cramps or spasms to be due to a serious underlying problem. In many cases, the cause of cramps or spasms is unknown. Some common causes are:   Overexertion.   Overuse from repetitive motions (doing the same thing over and over).    Remaining in a certain position for a long period of time.   Improper preparation, form, or technique while performing a sport or activity.   Dehydration.   Injury.   Side effects of some medicines.   Abnormally low levels of the salts and ions in your blood (electrolytes), especially potassium and calcium. This could happen if you are taking water pills (diuretics) or you are pregnant.  Some underlying medical problems can make it more likely to develop cramps or spasms. These include, but are not limited to:   Diabetes.   Parkinson disease.   Hormone disorders, such as thyroid problems.   Alcohol abuse.   Diseases specific to muscles, joints, and bones.   Blood vessel disease where not enough blood is getting to the muscles.  HOME CARE INSTRUCTIONS   Stay well hydrated. Drink enough water and fluids to keep your urine clear or pale yellow.  It may be helpful to massage, stretch, and relax the affected muscle.  For tight or tense muscles, use a warm towel, heating pad, or hot shower water directed to the affected area.  If you are sore or have pain after a cramp or spasm, applying  ice to the affected area may relieve discomfort.  Put ice in a plastic bag.  Place a towel between your skin and the bag.  Leave the ice on for 15-20 minutes, 03-04 times a day.  Medicines used to treat a known cause of cramps or spasms may help reduce their frequency or severity. Only take over-the-counter or prescription medicines as directed by your caregiver. SEEK MEDICAL CARE IF:  Your cramps or spasms get more severe, more frequent, or do not improve over time.  MAKE SURE YOU:   Understand these instructions.  Will watch your condition.  Will get help right away if you are not doing well or get worse.   This information is not intended to replace advice given to you by your health care provider. Make sure you discuss any questions you have with your health care  provider.   Document Released: 07/06/2001 Document Revised: 05/11/2012 Document Reviewed: 01/01/2012 Elsevier Interactive Patient Education [employer].

## 2014-11-12 NOTE — ED Notes (Signed)
Patient transported to X-ray 

## 2014-11-12 NOTE — ED Notes (Signed)
Pt reports to the ED for eval of hypertension and neck pain. Pt went to his PCP on Monday and they increased his dose of Metoprolol and stopped Lisinopril-HCTZ and started Losartan HCTZ 100/25 mg. Pt also reporting some left sided neck pain. Worse when he turns his head. Pt had some increased stress today. Pt also has a HA. Has had it off and on all week Grips equal and no arm drift noted. Pt A&Ox4, resp e/u, and skin warm and dry.

## 2014-11-12 NOTE — ED Provider Notes (Signed)
CSN: [id number]     Arrival date & time 11/12/14  2050 History   First MD Initiated Contact with Patient 11/12/14 2132     Chief Complaint  Patient presents with  . Hypertension     (Consider location/radiation/quality/duration/timing/severity/associated sxs/prior Treatment) HPI Patient reports his blood pressure has not been controlled for over a week. They've been monitoring pressures and they have been in the systolics from [id number] to high 190s. Diastolics ranged from the 80s to the low 100s. The patient's primary care doctor increased his metoprolol 100 mg twice a day, was changed from lisinopril to losartan. Patient has not had any chest pain or shortness of breath. He's had variable headaches that come and go. There are no associated symptoms of weakness or incoordination or visual change with headaches. Patient notes he has a sore area on the left side of his neck. It is worse when he turns his head. His companion reports that he sits in a chair watching television and falls asleep with his head cocked to the side. Past Medical History  Diagnosis Date  . Hypertension   . Arrhythmia   . Chest pain, unspecified   . Shortness of breath   . Unspecified sleep apnea   . Other left bundle branch block   . Obstructive sleep apnea (adult) (pediatric)   . Chronic airway obstruction, not elsewhere classified   . Other and unspecified hyperlipidemia   . Retinal vascular occlusion, unspecified     right  . Diabetes mellitus   . Nephrolithiasis   . Colon polyps     Adenomatous Polyps 2007  . Diverticulosis   . Depression   . Gout    Past Surgical History  Procedure Laterality Date  . Kidney stone surgery     Family History  Problem Relation Age of Onset  . Heart attack Brother 60    heart attack and CHF/ also some form of EP ablation  . Heart disease Father   . Uterine cancer Mother   . Diabetes Brother   . Rheum arthritis Cousin    Social History  Substance Use Topics  .  Smoking status: Former Smoker -- 1.50 packs/day for 25 years    Types: Cigarettes    Quit date: 01/28/1986  . Smokeless tobacco: Former Systems developer  . Alcohol Use: No    Review of Systems 10 Systems reviewed and are negative for acute change except as noted in the HPI.    Allergies  Penicillins  Home Medications   Prior to Admission medications   Medication Sig Start Date End Date Taking? Authorizing Provider  aspirin 81 MG tablet Take 81 mg by mouth daily.      Historical Provider, MD  atorvastatin (LIPITOR) 10 MG tablet Take 40 mg by mouth daily.      Historical Provider, MD  clopidogrel (PLAVIX) 75 MG tablet Take 75 mg by mouth daily.      Historical Provider, MD  glipiZIDE (GLUCOTROL) 10 MG tablet Take 10 mg by mouth daily.     Historical Provider, MD  hydrALAZINE (APRESOLINE) 25 MG tablet Take 1 tablet (25 mg total) by mouth 3 (three) times daily. 11/12/14   Charlesetta Shanks, MD  lisinopril-hydrochlorothiazide (PRINZIDE,ZESTORETIC) 20-12.5 MG per tablet TAKE 2 TABLETS BY MOUTH DAILY.*FOR FURTHER REFILLS MAKE APPOINTMENT OR REFILL THRU PCP.* 05/31/12   Larey Dresser, MD  metFORMIN (GLUCOPHAGE) 500 MG tablet Take 500 mg by mouth 2 (two) times daily with a meal.      Historical Provider, MD  metoprolol (LOPRESSOR) 50 MG tablet Take 1 tablet (50 mg total) by mouth 2 (two) times daily. 06/08/10   Larey Dresser, MD  sertraline (ZOLOFT) 50 MG tablet Take 50 mg by mouth daily.      Historical Provider, MD  sitaGLIPtan (JANUVIA) 100 MG tablet Take 100 mg by mouth daily.      Historical Provider, MD  terazosin (HYTRIN) 2 MG capsule Take 2 mg by mouth at bedtime.      Historical Provider, MD   BP 162/68 mmHg  Pulse 67  Temp(Src) 98.3 F (36.8 C) (Oral)  Resp 18  Ht 5\' 10"  (1.778 m)  Wt 210 lb (95.255 kg)  BMI 30.13 kg/m2  SpO2 92% Physical Exam  Constitutional: He is oriented to person, place, and time. He appears well-developed and well-nourished.  HENT:  Head: Normocephalic and  atraumatic.  Eyes: EOM are normal. Pupils are equal, round, and reactive to light.  Neck: Neck supple.  Reproducible pain in the trapezius on the left to palpation. Also reproduced by turning of the head.  Cardiovascular: Normal rate, regular rhythm, normal heart sounds and intact distal pulses.   Pulmonary/Chest: Effort normal and breath sounds normal.  Abdominal: Soft. Bowel sounds are normal. He exhibits no distension. There is no tenderness.  Musculoskeletal: Normal range of motion. He exhibits no edema.  Neurological: He is alert and oriented to person, place, and time. He has normal strength. Coordination normal. GCS eye subscore is 4. GCS verbal subscore is 5. GCS motor subscore is 6.  Skin: Skin is warm, dry and intact.  Psychiatric: He has a normal mood and affect.    ED Course  Procedures (including critical care time) Labs Review Labs Reviewed  COMPREHENSIVE METABOLIC PANEL - Abnormal; Notable for the following:    Chloride 100 (*)    Creatinine, Ser 1.34 (*)    GFR calc non Af Amer 52 (*)    GFR calc Af Amer 60 (*)    All other components within normal limits  TROPONIN I  CBC WITH DIFFERENTIAL/PLATELET    Imaging Review No results found. I have personally reviewed and evaluated these images and lab results as part of my medical decision-making.   EKG Interpretation   Date/Time:  Saturday November 12 2014 22:42:59 EDT Ventricular Rate:  62 PR Interval:  171 QRS Duration: 151 QT Interval:  445 QTC Calculation: 452 R Axis:   -58 Text Interpretation:  Sinus rhythm Left bundle branch block agree. old  [facility] ED PHYSICIAN INTERPRETATION AVAILABLE IN [facility] Confirmed by  TEST, Record ([id number]) on 11/13/2014 8:07:36 AM      MDM   Final diagnoses:  Essential hypertension  Musculoskeletal neck pain   Patient has had poorly controlled hypertension without signs of endorgan damage. Oral hydralazine was added. Patient's blood pressure is responding. Hydralazine  will be added and the patient is counseled to follow up closely with his family doctor for monitoring and medication adjustments as needed. The patient has neck pain complaint consists of musculoskeletal spasm. With a mechanical etiology.    Charlesetta Shanks, MD 11/16/14 [phone]

## 2014-11-14 ENCOUNTER — Other Ambulatory Visit: Payer: Self-pay | Admitting: Family Medicine

## 2014-11-14 DIAGNOSIS — I10 Essential (primary) hypertension: Secondary | ICD-10-CM

## 2014-11-21 ENCOUNTER — Ambulatory Visit
Admission: RE | Admit: 2014-11-21 | Discharge: 2014-11-21 | Disposition: A | Discharge status: Home / Self Care | Payer: Medicare Other | Source: Ambulatory Visit | Attending: Family Medicine | Admitting: Family Medicine

## 2014-11-21 DIAGNOSIS — I10 Essential (primary) hypertension: Secondary | ICD-10-CM

## 2015-05-11 ENCOUNTER — Ambulatory Visit (INDEPENDENT_AMBULATORY_CARE_PROVIDER_SITE_OTHER): Payer: Medicare Other | Admitting: Internal Medicine

## 2015-05-11 ENCOUNTER — Encounter: Payer: Self-pay | Admitting: Internal Medicine

## 2015-05-11 VITALS — BP 130/74 | HR 57 | Ht 70.5 in | Wt 216.4 lb

## 2015-05-11 DIAGNOSIS — G4733 Obstructive sleep apnea (adult) (pediatric): Secondary | ICD-10-CM

## 2015-05-11 NOTE — Patient Instructions (Signed)
Order- DME [facility]- continue CPAP 10, mask of choice, humidifier, supplies, AirView   Dx OSA                         He needs help now refitting mask for comfort to help with compliance  Please call as needed

## 2015-05-11 NOTE — Progress Notes (Signed)
   Subjective:    Patient ID: Jeffery Fernandez, male    DOB: [birth year], 69 y.o.   MRN: [id number]  HPI 04/25/14- Dr Gwenette Greet The patient comes in today for follow-up of his obstructive sleep apnea. He is wearing his device fairly compliantly, but has had issues with a new puppy attacking his equipment while he is trying to sleep. He feels that he sleeps well with the device, and is satisfied with his daytime alertness. He has not kept up with his new mask cushion changes on a regular basis.  05/11/2015-70 year old male followed for OSA, complicated by COPD, CAD, HBP, DM CPAP 10/[facility] FOLLOWS FOR: DME is [facility]. Pt states mask is bothering him therefore he has not been using CPAP as he should.  Wife is here Based on download, he has hardly worn his mask in months because he says it hurts his face. He has not called his DME company about it. Admits he snores more and feels less rested without CPAP.  Review of Systems  Constitutional: Negative for fever and unexpected weight change.  HENT: Negative for congestion, dental problem, ear pain, nosebleeds, postnasal drip, rhinorrhea, sinus pressure, sneezing, sore throat and trouble swallowing.   Eyes: Negative for redness and itching.  Respiratory: Negative for cough, chest tightness, shortness of breath and wheezing.   Cardiovascular: Negative for palpitations and leg swelling.  Gastrointestinal: Negative for nausea and vomiting.  Genitourinary: Negative for dysuria.  Musculoskeletal: Negative for joint swelling.  Skin: Negative for rash.  Neurological: Negative for headaches.  Hematological: Does not bruise/bleed easily.  Psychiatric/Behavioral: Negative for dysphoric mood. The patient is not nervous/anxious.      Objective:   OBJ- Physical Exam General- Alert, Oriented, Affect-appropriate, Distress- none acute, + Overweight Skin- rash-none, lesions- none, excoriation- none Lymphadenopathy- none Head- atraumatic            Eyes- Gross vision  intact, PERRLA, conjunctivae and secretions clear            Ears- Hearing, canals-normal            Nose- Clear, no-Septal dev, mucus, polyps, erosion, perforation             Throat- Mallampati III-IV , mucosa clear , drainage- none, tonsils- atrophic, + dental repair Neck- flexible , trachea midline, no stridor , thyroid nl, carotid no bruit Chest - symmetrical excursion , unlabored           Heart/CV- RRR , no murmur , no gallop  , no rub, nl s1 s2                           - JVD- none , edema- none, stasis changes- none, varices- none           Lung- clear to P&A, wheeze- none, cough- none , dullness-none, rub- none           Chest wall-  Abd-  Br/ Gen/ Rectal- Not done, not indicated Extrem- cyanosis- none, clubbing, none, atrophy- none, strength- nl Neuro- grossly intact to observation         Assessment & Plan:

## 2015-05-14 NOTE — Assessment & Plan Note (Signed)
Inadequate use of CPAP. We discussed his responsibility to call his DME company if mask is uncomfortable. Plan-we will contact [facility] for help refill fitting mask

## 2015-09-13 DIAGNOSIS — E1159 Type 2 diabetes mellitus with other circulatory complications: Secondary | ICD-10-CM | POA: Insufficient documentation

## 2015-09-13 DIAGNOSIS — E291 Testicular hypofunction: Secondary | ICD-10-CM | POA: Insufficient documentation

## 2016-03-26 ENCOUNTER — Other Ambulatory Visit (HOSPITAL_COMMUNITY): Payer: Self-pay | Admitting: Family Medicine

## 2016-03-26 ENCOUNTER — Other Ambulatory Visit: Payer: Self-pay | Admitting: Family Medicine

## 2016-03-26 DIAGNOSIS — R202 Paresthesia of skin: Secondary | ICD-10-CM

## 2016-03-26 DIAGNOSIS — R51 Headache: Secondary | ICD-10-CM

## 2016-03-26 DIAGNOSIS — D649 Anemia, unspecified: Secondary | ICD-10-CM

## 2016-03-26 DIAGNOSIS — R519 Headache, unspecified: Secondary | ICD-10-CM

## 2016-03-29 ENCOUNTER — Encounter (HOSPITAL_COMMUNITY): Payer: Self-pay

## 2016-03-29 ENCOUNTER — Ambulatory Visit (HOSPITAL_COMMUNITY)
Admission: RE | Admit: 2016-03-29 | Discharge: 2016-03-29 | Disposition: A | Discharge status: Home / Self Care | Payer: Medicare Other | Source: Ambulatory Visit | Attending: Family Medicine | Admitting: Family Medicine

## 2016-03-29 DIAGNOSIS — G319 Degenerative disease of nervous system, unspecified: Secondary | ICD-10-CM | POA: Diagnosis not present

## 2016-03-29 DIAGNOSIS — I6782 Cerebral ischemia: Secondary | ICD-10-CM | POA: Diagnosis not present

## 2016-03-29 DIAGNOSIS — R51 Headache: Secondary | ICD-10-CM | POA: Diagnosis present

## 2016-03-29 DIAGNOSIS — D649 Anemia, unspecified: Secondary | ICD-10-CM | POA: Insufficient documentation

## 2016-03-29 DIAGNOSIS — R202 Paresthesia of skin: Secondary | ICD-10-CM | POA: Diagnosis present

## 2016-03-29 DIAGNOSIS — R519 Headache, unspecified: Secondary | ICD-10-CM

## 2016-04-01 ENCOUNTER — Encounter: Payer: Self-pay | Admitting: Neurology

## 2016-04-01 ENCOUNTER — Encounter (INDEPENDENT_AMBULATORY_CARE_PROVIDER_SITE_OTHER): Payer: Self-pay

## 2016-04-01 ENCOUNTER — Ambulatory Visit (INDEPENDENT_AMBULATORY_CARE_PROVIDER_SITE_OTHER): Payer: Medicare Other | Admitting: Neurology

## 2016-04-01 VITALS — BP 138/70 | HR 60 | Ht 70.5 in | Wt 214.2 lb

## 2016-04-01 DIAGNOSIS — M5412 Radiculopathy, cervical region: Secondary | ICD-10-CM

## 2016-04-01 DIAGNOSIS — M542 Cervicalgia: Secondary | ICD-10-CM

## 2016-04-01 DIAGNOSIS — R51 Headache: Secondary | ICD-10-CM

## 2016-04-01 DIAGNOSIS — R531 Weakness: Secondary | ICD-10-CM | POA: Diagnosis not present

## 2016-04-01 DIAGNOSIS — I639 Cerebral infarction, unspecified: Secondary | ICD-10-CM

## 2016-04-01 DIAGNOSIS — R519 Headache, unspecified: Secondary | ICD-10-CM

## 2016-04-01 NOTE — Patient Instructions (Signed)
Remember to drink plenty of fluid, eat healthy meals and do not skip any meals. Try to eat protein with a every meal and eat a healthy snack such as fruit or nuts in between meals. Try to keep a regular sleep-wake schedule and try to exercise daily, particularly in the form of walking, 20-30 minutes a day, if you can.   As far as your medications are concerned, I would like to suggest: Continue ASA and Plavix  As far as diagnostic testing: MRI brain, MRA head, Carotid Dopplers, Physical therapy, emg/ncs  I would like to see you back in 3 months and an emg/ncs, sooner if we need to. Please call us with any interim questions, concerns, problems, updates or refill requests.   Our phone number is [phone]. We also have an after hours call service for urgent matters and there is a physician on-call for urgent questions. For any emergencies you know to call 911 or go to the nearest emergency room

## 2016-04-01 NOTE — Progress Notes (Signed)
[facility]    Provider:  Dr Jaynee Eagles Referring Provider: Dione Housekeeper, MD Primary Care Physician:  Sherrie Mustache, MD  CC:  Arm weakness and headaches  HPI:  Jeffery Fernandez is a 71 y.o.right-handed  male here as a referral from Dr. Edrick Oh for arm weakness and headaches. PMHx OSA, COPD, CAD, HTN, DM on CPAP. Here with wife and daughter. He has been married for 52 years. The arm weakness started 3-4 months and worsening, getting more noticeable. He noticed it when he was picking up something a few months ago, didn;t have the strength. Patient says it is about the same but family thinks it progressing worsening. Wife and daughter provide much information. Wife says he avoids using the arm. Daughter noticed recently he had brought in a roast and when he tried to put it on the counter he had to use his whole body, favoring the left arm. No pain, just no strength. He has neck pain on the left side. He is also having headaches, they are all over around the back of the head and the top of the head, 3x a week and they gradually go away on their own he has neck pain and radiation up the left of his neck. 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 He endorses using the cpap "sometimes". His sleep is disturbed lately he is worrying a lot. He had an eye exam with improved vision, denies any sensory changes in the right arm except sore left wrist joint maybe. Daughter has noticed the right leg weakness and stumbling due to the right. His balance may be "off", he has noticed he has a little bit of slurred speech (family has not noticed), no dysphagia, no facial weakness or facial droop, no significant weight loss, no abnormal muscle movements. He has a brother with Parkinson's disease. No other focal neurologic deficits, associated symptoms, inciting events or modifiable factors.     Reviewed notes, labs and imaging from outside physicians, which showed:  hgba1c 7.1, total cholesterol  114   Reviewed primary care notes. Patient has right arm weakness has been ongoing for several months. He notices it when he picks things up. He is weak in the right side. He's had some headaches on and off but nothing acute. Says his arm gradually has been weaker. The family has noticed he is having difficulty lifting things. Has not had any speech involvement or intermittent neck pain. Has not been tripping or falling. Exam noted right upper extremity weakness.  Was seen in the ED 11/12/2014 with headaches in the setting of uncontrolled BP. Otherwise previous notes by Pulmonology for OSA on cpap last note documented "Based on download, he has hardly worn his mask in months because he says it hurts his face. He has not called his DME company about it. Admits he snores more and feels less rested without CPAP."  Personally reviewed images CT head 03/29/2016 and agree with the following:  Brain: Moderate atrophy. Extensive white matter hypodensities bilaterally which appears chronic.  Negative for acute infarct. Negative for hemorrhage or mass or fluid collection. No shift of the midline structures.  Vascular: No hyperdense vessel or unexpected calcification.  Skull: Negative  Sinuses/Orbits: Negative  Other: None  IMPRESSION: Atrophy and microvascular ischemia diffusely which appears chronic.No acute abnormality  Review of Systems: Patient complains of symptoms per HPI as well as the following symptoms: No CP, no SOB: . Pertinent negatives per HPI. All others negative.   Social History  Social History  . Marital status: Married    Spouse name: Lelon Frohlich  . Number of children: 1  . Years of education: N/A   Occupational History  . 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   Social History Main Topics  . Smoking status: Former Smoker    Packs/day: 1.50    Years: 25.00    Types: Cigarettes    Quit date: 01/28/1986  . Smokeless tobacco: Former Systems developer  .  Alcohol use No     Comment: Very rare  . Drug use: No  . Sexual activity: Not on file     Comment: Married   Other Topics Concern  . 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 Larena Glassman from [place] and moved back there after retirement. He  Has a daughter living in [place]. Quit smoking around 1988. Married   Right-handed   Caffeine: soda daily, decaf coffee    Family History  Problem Relation Age of Onset  . Heart attack Brother 60    heart attack and CHF/ also some form of EP ablation  . Parkinson's disease Brother   . Heart disease Father   . Uterine cancer Mother   . Diabetes Brother   . Rheum arthritis Cousin     Past Medical History:  Diagnosis Date  . Arrhythmia   . Chest pain, unspecified   . Chronic airway obstruction, not elsewhere classified   . Colon polyps    Adenomatous Polyps 2007  . Depression   . Diabetes mellitus   . Diverticulosis   . Gout   . Hypertension   . Nephrolithiasis   . Obstructive sleep apnea (adult) (pediatric)   . Other and unspecified hyperlipidemia   . Other left bundle branch block   . Retinal vascular occlusion, unspecified    right  . Shortness of breath   . Unspecified sleep apnea   . Vision loss of right eye     Past Surgical History:  Procedure Laterality Date  . CATARACT EXTRACTION Bilateral   . KIDNEY STONE SURGERY      Current Outpatient Prescriptions  Medication Sig Dispense Refill  . aspirin 81 MG tablet Take 81 mg by mouth daily.      Marland Kitchen atorvastatin (LIPITOR) 10 MG tablet Take 40 mg by mouth daily.      . clopidogrel (PLAVIX) 75 MG tablet Take 75 mg by mouth daily.      Marland Kitchen glipiZIDE (GLUCOTROL XL) 10 MG 24 hr tablet TAKE 1 TABLET TWICE A DAY    . hydrALAZINE (APRESOLINE) 25 MG tablet Take 1 tablet (25 mg total) by mouth 3 (three) times daily. 90 tablet 0  . lisinopril-hydrochlorothiazide (PRINZIDE,ZESTORETIC) 20-12.5 MG per tablet TAKE 2 TABLETS BY  MOUTH DAILY.*FOR FURTHER REFILLS MAKE APPOINTMENT OR REFILL THRU PCP.* 30 tablet 0  . metFORMIN (GLUCOPHAGE) 500 MG tablet Take 500 mg by mouth 2 (two) times daily with a meal.      . metoprolol (LOPRESSOR) 50 MG tablet Take 1 tablet (50 mg total) by mouth 2 (two) times daily.    . pantoprazole (PROTONIX) 40 MG tablet Take by mouth.    . sertraline (ZOLOFT) 50 MG tablet Take 50 mg by mouth daily.      . sitaGLIPtan (JANUVIA) 100 MG tablet Take 100 mg by mouth daily.      Marland Kitchen terazosin (HYTRIN) 2 MG capsule Take 2  mg by mouth at bedtime.       No current facility-administered medications for this visit.     Allergies as of 04/01/2016 - Review Complete 04/01/2016  Allergen Reaction Noted  . Penicillins Other (See Comments) 06/08/2010    Vitals: BP 138/70   Pulse 60   Ht 5' 10.5" (1.791 m)   Wt 214 lb 3.2 oz (97.2 kg)   BMI 30.30 kg/m  Last Weight:  Wt Readings from Last 1 Encounters:  04/01/16 214 lb 3.2 oz (97.2 kg)   Last Height:   Ht Readings from Last 1 Encounters:  04/01/16 5' 10.5" (1.791 m)   Physical exam: Exam: Gen: NAD, conversant, well nourised, obese, well groomed                     CV: RRR, no MRG. No Carotid Bruits. No peripheral edema, warm, nontender Eyes: Conjunctivae clear without exudates or hemorrhage  Neuro: Detailed Neurologic Exam  Speech:    Speech is normal; fluent and spontaneous with normal comprehension.  Cognition:    The patient is oriented to person, place, and time;     recent and remote memory intact;     language fluent;     normal attention, concentration,     fund of knowledge Cranial Nerves:    The pupils are equal, round, and reactive to light. Dec visual acuity right eye nasal field, can see shadows but can;t count fingers. Attempted fundoscopic exam could not visualize. Extraocular movements are intact. Trigeminal sensation is intact and the muscles of mastication are normal. The face is symmetric. The palate elevates in the  midline. Hearing intact. Voice is normal. Shoulder shrug is normal. The tongue has normal motion without fasciculations.   Coordination:    Normal finger to nose and heel to shin.    Gait:    Heel-toe and tandem gait are normal. Mild imbalance with tandem.   Motor Observation:    no involuntary movements noted. Tone:    Normal muscle tone.    Posture:    Posture is normal. normal erect    Strength: right arm 3+/5 in the deltoid, biceps and triceps. Weak grip right hans. Right prox LE weakness 4/5. Otherwise strength is V/V in the upper and lower limbs.      Sensation: intact to LT     Reflex Exam:  DTR's:    Absent AJs otherwise deep tendon reflexes in the upper and lower extremities are normal bilaterally.   Toes:    The toes are downgoing bilaterally.   Clonus:    Clonus is absent.       Assessment/Plan:   71 y.o.right-handed  male here as a referral from Dr. Edrick Oh for arm weakness and headaches. PMHx OSA, COPD, CAD, HTN, DM on CPAP. He has generalized right arm weakness and proximal leg weakness. No sensory changes. CT of the head was negative for strokes but need an MRI of the brain to fully evaluate for strokes. Will also add contrast due to new onset headaches after the age of 62 to also look for lesions or masses.   MRI brain w/wo contrast MRA head for thromboembolic source Carotid dopplers to eval for carotid stenosis  Continue Plavix, ASA and statin Will also order an emg/ncs of right am and right leg but if MRI shows etiology can later cancel Physical therapy for right sided weakness  I had a long d/w patient about a possible stroke, risk for recurrent stroke/TIAs, personally independently reviewed  imaging studies and stroke evaluation results and answered questions.Continue Plavix and ASA for secondary stroke prevention and maintain strict control of hypertension with blood pressure goal below 130/90, diabetes with hemoglobin A1c goal below 6.5% and lipids with  LDL cholesterol goal below 70 mg/dL. I also advised the patient to eat a healthy diet with plenty of whole grains, cereals, fruits and vegetables, exercise regularly and maintain ideal body weight .  Orders Placed This Encounter  Procedures  . MR BRAIN W WO CONTRAST  . MR MRA HEAD WO CONTRAST  . NCV with EMG(electromyography)   Also ordered VAS carotid doppler  Sarina Ill, MD  [facility] [address] [place], [place] [address]  Phone [phone] Fax [phone]

## 2016-04-10 ENCOUNTER — Telehealth: Payer: Self-pay

## 2016-04-10 ENCOUNTER — Ambulatory Visit
Admission: RE | Admit: 2016-04-10 | Discharge: 2016-04-10 | Disposition: A | Discharge status: Home / Self Care | Payer: Medicare Other | Source: Ambulatory Visit | Attending: Neurology | Admitting: Neurology

## 2016-04-10 DIAGNOSIS — R519 Headache, unspecified: Secondary | ICD-10-CM

## 2016-04-10 DIAGNOSIS — R531 Weakness: Secondary | ICD-10-CM | POA: Diagnosis not present

## 2016-04-10 DIAGNOSIS — R51 Headache: Secondary | ICD-10-CM

## 2016-04-10 DIAGNOSIS — I639 Cerebral infarction, unspecified: Secondary | ICD-10-CM

## 2016-04-10 MED ORDER — GADOBENATE DIMEGLUMINE 529 MG/ML IV SOLN
10.0000 mL | Freq: Once | INTRAVENOUS | Status: AC | PRN
  Administered 2016-04-10: 10 mL via INTRAVENOUS

## 2016-04-10 NOTE — Telephone Encounter (Signed)
Received call from Nunez w/ GI who reports that pt is currently on table for MRI/MRA. Gave verbal permission to give half dose of contrast due to GFR of 52, hx of htn and DM.

## 2016-04-11 ENCOUNTER — Ambulatory Visit (HOSPITAL_COMMUNITY)
Admission: RE | Admit: 2016-04-11 | Discharge: 2016-04-11 | Disposition: A | Discharge status: Home / Self Care | Payer: Medicare Other | Source: Ambulatory Visit | Attending: Vascular Surgery | Admitting: Vascular Surgery

## 2016-04-11 DIAGNOSIS — R51 Headache: Secondary | ICD-10-CM | POA: Diagnosis not present

## 2016-04-11 DIAGNOSIS — R531 Weakness: Secondary | ICD-10-CM

## 2016-04-11 DIAGNOSIS — I6523 Occlusion and stenosis of bilateral carotid arteries: Secondary | ICD-10-CM | POA: Insufficient documentation

## 2016-04-11 DIAGNOSIS — I639 Cerebral infarction, unspecified: Secondary | ICD-10-CM | POA: Diagnosis not present

## 2016-04-11 DIAGNOSIS — R519 Headache, unspecified: Secondary | ICD-10-CM

## 2016-04-11 LAB — VAS US CAROTID
LCCADSYS: 68 cm/s
LEFT ECA DIAS: -10 cm/s
LEFT VERTEBRAL DIAS: 16 cm/s
LICADDIAS: -22 cm/s
LICADSYS: -74 cm/s
LICAPDIAS: -18 cm/s
LICAPSYS: -63 cm/s
Left CCA dist dias: 15 cm/s
Left CCA prox dias: 20 cm/s
Left CCA prox sys: 149 cm/s
RIGHT CCA MID DIAS: 13 cm/s
RIGHT ECA DIAS: -12 cm/s
RIGHT VERTEBRAL DIAS: 17 cm/s
Right CCA prox dias: 15 cm/s
Right CCA prox sys: 108 cm/s
Right cca dist sys: -94 cm/s

## 2016-04-12 ENCOUNTER — Telehealth: Payer: Self-pay | Admitting: *Deleted

## 2016-04-12 NOTE — Telephone Encounter (Signed)
Per Dr Jaynee Eagles, spoke with wife, on DPR and informed her that her husband's MRI brain and MRA head did not show etiology for his symptoms. Advised there are no strokes, and Dr Jaynee Eagles can review the results with them at his emg/ncs in April. Wife then asked if doppler results, done yesterday are back. Advised her it will be next week and patient will get call when results are ready. She verbalized understanding, appreciation.

## 2016-04-15 ENCOUNTER — Ambulatory Visit: Payer: Medicare Other | Attending: Neurology | Admitting: Physical Therapy

## 2016-04-15 ENCOUNTER — Encounter: Payer: Self-pay | Admitting: Physical Therapy

## 2016-04-15 DIAGNOSIS — R42 Dizziness and giddiness: Secondary | ICD-10-CM | POA: Insufficient documentation

## 2016-04-15 DIAGNOSIS — M542 Cervicalgia: Secondary | ICD-10-CM | POA: Insufficient documentation

## 2016-04-15 DIAGNOSIS — M6281 Muscle weakness (generalized): Secondary | ICD-10-CM | POA: Insufficient documentation

## 2016-04-15 NOTE — Therapy (Signed)
[place] MAIN [facility] [address] [place], [place], [address] Phone: [phone]   Fax:  [phone]  Physical Therapy Evaluation  Patient Details  Name: Jeffery Fernandez MRN: [id number] Date of Birth: 03/14/1945 Referring Provider: Melvenia Beam,  Encounter Date: 04/15/2016      PT End of Session - 04/15/16 1405    Visit Number 1   Number of Visits 17   Date for PT Re-Evaluation 2016/06/27   Authorization Type g codes   PT Start Time 0145   PT Stop Time 0245   PT Time Calculation (min) 60 min   Activity Tolerance Patient tolerated treatment well   Behavior During Therapy [facility] for tasks assessed/performed      Past Medical History:  Diagnosis Date  . Arrhythmia   . Chest pain, unspecified   . Chronic airway obstruction, not elsewhere classified   . Colon polyps    Adenomatous Polyps 2007  . Depression   . Diabetes mellitus   . Diverticulosis   . Gout   . Hypertension   . Nephrolithiasis   . Obstructive sleep apnea (adult) (pediatric)   . Other and unspecified hyperlipidemia   . Other left bundle branch block   . Retinal vascular occlusion, unspecified    right  . Shortness of breath   . Unspecified sleep apnea   . Vision loss of right eye     Past Surgical History:  Procedure Laterality Date  . CATARACT EXTRACTION Bilateral   . KIDNEY STONE SURGERY      There were no vitals filed for this visit.       Subjective Assessment - 04/15/16 1353    Subjective Patient is reporting that his right arm and right leg is getting weak and it has been going on for several months. He does not use his right hand for carrying in groceries.    Pertinent History Pateint ambualtes without AD and he is having weakness in RUE and does not feel that he does not have good control of it. He has difficutly with lifting buckets of water to wash the car. This episode started 6 monhts ago. Patient is having a nerve conduction test next  week. He had a MRI of his brain and performed a doppler of the carotid artery  but dont have the results. He also had a CT scan of his head.    Limitations Lifting;House hold activities   How long can you sit comfortably? unlimited   How long can you stand comfortably? unlimited   How long can you walk comfortably? normal distances and long distances   Patient Stated Goals He wants to get his right arm stronger and perform house hold chores using his RUE   Currently in Pain? No/denies   Pain Score 0-No pain   Multiple Pain Sites No            [facility] PT Assessment - 04/15/16 0001      Assessment   Medical Diagnosis right UE weakness   Referring Provider Melvenia Beam,   Onset Date/Surgical Date 12/29/15   Hand Dominance Right   Next MD Visit --  april for nerve conduction test   Prior Therapy --  no     Precautions   Precautions None     Restrictions   Weight Bearing Restrictions No     Balance Screen   Has the patient fallen in the past 6 months No   Has the patient had a  decrease in activity level because of a fear of falling?  Yes   Is the patient reluctant to leave their home because of a fear of falling?  No     Home Environment   Living Environment Private residence   Living Arrangements Spouse/significant other   Type of [place] to enter   Entrance Stairs-Number of Steps --  5   Entrance Stairs-Rails Right;Left;Can reach both   [place] One level   [place] - single point     Prior Function   Level of [place] Retired      PAIN:  Pain with rotation to the left 3/10 that is intermittent and only during left rotation, extension and L SB  POSTURE: Rounded shoulders fwd head   PROM/AROM: RUE strength: Shoulder:  IR -4/5 ER 4/5 Deltoid 3+/5  Elbow: Supination is 5/5 Pronation is 3+/5 Triceps is 4+/5 Biceps is 3+/5  I LUE strength: 5/5 shoulder/elbow and wrist and hand Grip  strength 26 lbs bilaterally  RLE strength : hip 4/5 Knee 4+/5 Ankle 5/5   ROM neck AROM rotation left 65 deg/ right 70 deg; SBL 20 deg, SB right 25 deg; neck flex 50, neck ext 20    SENSATION: WNL BUE   SPECIAL TESTS: + spurlings test, SB L   FUNCTIONAL MOBILITY:independent   BALANCE: min impaired with tandem stand   GAIT: independent with gait and no AD intermediate and long distances  OUTCOME MEASURES: TEST Outcome Interpretation  Quick dash  40.90  0= no disabiity                                              PT Education - 04/15/16 1404    Education provided Yes   Education Details plan of care   Person(s) Educated Patient   Methods Explanation   Comprehension Verbalized understanding             PT Long Term Goals - 04/15/16 1700      PT LONG TERM GOAL #1   Title Patient will be independent in home exercise program to improve strength/mobility for better functional independence with ADLs.   Time 8   Period Weeks   Status New     PT LONG TERM GOAL #2   Title Patient will increase BLE gross strength to 4+/5 as to improve functional strength for independent gait, increased standing tolerance and increased ADL ability.   Baseline 3+/5 RUE, 4/5 RLE   Time 8   Period Weeks   Status New     PT LONG TERM GOAL #3   Title Patient will report a worst pain of 1/10 on VAS in  neck           to improve tolerance with ADLs and reduced symptoms with activities.    Baseline 3/10   Time 8   Period Weeks   Status New     PT LONG TERM GOAL #4   Title Patient will decrease quick dash to demonstrate improved functional mobility and increased tolerance with ADLs   Baseline 40.90   Time 8   Period Weeks   Status New               Plan - 04/15/16 1642    Clinical Impression Statement Patient is 71 year old male who  presents with dx of right shoulder and right leg weakness. He reports getting dizzy after lying down that is  intermittent and frequent. He has decreased cervial  AROM and  reports neck pain with AROM in left SB, left rotation, and extension.. He has right UE and right LE proximal weakness. He has a quick dash of 40.90 and will benefit from skilled PT to improve strength to RUE and RLE and ROM to neck and improve quick dash test   Rehab Potential Good   PT Frequency 2x / week   PT Duration 8 weeks   PT Treatment/Interventions Canalith Repostioning;Cryotherapy;Moist Heat;Traction;Ultrasound;Therapeutic exercise;Therapeutic activities;Manual techniques;Electrical Stimulation;Neuromuscular re-education;Balance training;Patient/family education;Passive range of motion;Vestibular   PT Next Visit Plan manual therapy, dix hall pike after get signed order for dizziness   PT Home Exercise Plan theraputty    Consulted and Agree with Plan of Care Patient;Family member/caregiver      Patient will benefit from skilled therapeutic intervention in order to improve the following deficits and impairments:  Decreased range of motion, Decreased strength, Pain, Dizziness  Visit Diagnosis: Muscle weakness (generalized)  Cervicalgia  Dizziness and giddiness      G-Codes - 04-28-2016 1816    Functional Assessment Tool Used (Outpatient Only) quick dash , clinical judgement   Functional Limitation Mobility: Walking and moving around   Mobility: Walking and Moving Around Current Status (P0051) At least 60 percent but less than 80 percent impaired, limited or restricted   Mobility: Walking and Moving Around Goal Status [phone]) At least 40 percent but less than 60 percent impaired, limited or restricted       Problem List Patient Active Problem List   Diagnosis Date Noted  . Personal history of colonic polyps 06/14/2011  . CAD (coronary artery disease) 07/10/2010  . Dyspnea 06/10/2010  . OSA (obstructive sleep apnea) 06/10/2010  . Hypertension 06/10/2010  . COPD (chronic obstructive pulmonary disease) ([place])  06/10/2010  . Hyperlipidemia 06/10/2010  . Retinal artery occlusion 06/10/2010  Alanson Puls, PT, DPT  [place], Minette [place] S 04/28/16, 6:17 PM  [place] MAIN [facility] [address] [place], [place], [address] Phone: [phone]   Fax:  [phone]  Name: CONRAD ZAJKOWSKI MRN: [id number] Date of Birth: 23-Nov-1945

## 2016-04-15 NOTE — Telephone Encounter (Signed)
Daughter called requesting doppler results. Scanned in EPIC w/ results showing "less than 40% bilateral internal carotid artery stenosis."

## 2016-04-15 NOTE — Telephone Encounter (Signed)
Yes, tell them there is no hemodynamically significant stenosis of his carotid arteries. thanks

## 2016-04-16 NOTE — Telephone Encounter (Signed)
Called back and let pt's wife know that MRI of the neck has been ordered. Voiced understanding and appreciation for call.

## 2016-04-16 NOTE — Telephone Encounter (Signed)
I ordered the MRI of the cervical spine. After we get the results I will place an order for PT want to make sure the neck is ok first thanks

## 2016-04-16 NOTE — Addendum Note (Signed)
Addended by: Sarina Ill B on: 04/16/2016 01:01 PM   Modules accepted: Orders

## 2016-04-16 NOTE — Telephone Encounter (Signed)
Called and spoke to pt's wife. Reviewed doppler results of which she verbalized understanding. However, pt went to outpt pt yesterday for R sided weakness/shoulder pain. PT told pt and family that there wasn't much that could be done for the shoulder pain b/c the pain was more than likely coming from his neck, also suggested cervical MRI and/or new PT order for neck pain. Pt goes back for his next PT visit tomorrow afternoon and wife would like to hear back on any new orders/MD recommendations before then.

## 2016-04-17 ENCOUNTER — Encounter: Payer: Self-pay | Admitting: Physical Therapy

## 2016-04-17 ENCOUNTER — Ambulatory Visit: Payer: Medicare Other | Admitting: Physical Therapy

## 2016-04-17 DIAGNOSIS — M542 Cervicalgia: Secondary | ICD-10-CM

## 2016-04-17 DIAGNOSIS — M6281 Muscle weakness (generalized): Secondary | ICD-10-CM | POA: Diagnosis not present

## 2016-04-17 DIAGNOSIS — R42 Dizziness and giddiness: Secondary | ICD-10-CM

## 2016-04-17 NOTE — Therapy (Signed)
[place] MAIN [facility] SERVICES [address] [place], [place], [address] Phone: [phone]   Fax:  [phone]  Physical Therapy Treatment  Patient Details  Name: Jeffery Fernandez MRN: [id number] Date of Birth: [birth year] Referring Provider: Melvenia Beam,  Encounter Date: 04/17/2016      PT End of Session - 04/17/16 1442    Visit Number 2   Number of Visits 17   Date for PT Re-Evaluation 06/10/16   Authorization Type 2 /10 g codes   PT Start Time 0235   PT Stop Time 0315   PT Time Calculation (min) 40 min   Activity Tolerance Patient tolerated treatment well   Behavior During Therapy [facility] for tasks assessed/performed      Past Medical History:  Diagnosis Date  . Arrhythmia   . Chest pain, unspecified   . Chronic airway obstruction, not elsewhere classified   . Colon polyps    Adenomatous Polyps 2007  . Depression   . Diabetes mellitus   . Diverticulosis   . Gout   . Hypertension   . Nephrolithiasis   . Obstructive sleep apnea (adult) (pediatric)   . Other and unspecified hyperlipidemia   . Other left bundle branch block   . Retinal vascular occlusion, unspecified    right  . Shortness of breath   . Unspecified sleep apnea   . Vision loss of right eye     Past Surgical History:  Procedure Laterality Date  . CATARACT EXTRACTION Bilateral   . KIDNEY STONE SURGERY      There were no vitals filed for this visit.      Subjective Assessment - 04/17/16 1429    Subjective Patient is reporting that his right wrist is sore from using the theraputty.    Pertinent History Pateint ambualtes without AD and he is having weakness in RUE and does not feel that he does not have good control of it. He has difficutly with lifting buckets of water to wash the car. This episode started 6 monhts ago. Patient is having a nerve conduction test next week. He had a MRI of his brain and performed a doppler of the carotid artery  but dont have the  results. He also had a CT scan of his head.    Limitations Lifting;House hold activities   How long can you sit comfortably? unlimited   How long can you stand comfortably? unlimited   How long can you walk comfortably? normal distances and long distances   Patient Stated Goals He wants to get his right arm stronger and perform house hold chores using his RUE   Currently in Pain? Yes   Pain Score 5    Pain Location Wrist   Pain Orientation Right   Pain Descriptors / Indicators Sore   Pain Type Acute pain   Pain Radiating Towards wrist is sore and not radiating   Pain Onset Today   Aggravating Factors  theraputty   Pain Relieving Factors ice   Effect of Pain on Daily Activities none   Multiple Pain Sites No     Treatment: Leg press 120 lbs x 20 x 3 Squats x 20 x 2 4 way hip with RTB x 15 x 2 Step ups x 20 x 3 sets Heel raises x 20 x 2 Side stepping with RTB around the knees x 30 feet x 2 UBE x 5 minutes  (2 1/2 fwd, 21/2 bwd )       Patient needs  occasional verbal cueing to improve posture and cueing to correctly perform exercises slowly, with correct posture and technique. Patient was given RTB and instructions for HEP with 3 way hip.                       PT Education - 04/17/16 1442    Education provided Yes   Education Details HEP   Methods Explanation   Comprehension Verbalized understanding             PT Long Term Goals - 04/15/16 1700      PT LONG TERM GOAL #1   Title Patient will be independent in home exercise program to improve strength/mobility for better functional independence with ADLs.   Time 8   Period Weeks   Status New     PT LONG TERM GOAL #2   Title Patient will increase BLE gross strength to 4+/5 as to improve functional strength for independent gait, increased standing tolerance and increased ADL ability.   Baseline 3+/5 RUE, 4/5 RLE   Time 8   Period Weeks   Status New     PT LONG TERM GOAL #3   Title Patient  will report a worst pain of 1/10 on VAS in  neck           to improve tolerance with ADLs and reduced symptoms with activities.    Baseline 3/10   Time 8   Period Weeks   Status New     PT LONG TERM GOAL #4   Title Patient will decrease quick dash to demonstrate improved functional mobility and increased tolerance with ADLs   Baseline 40.90   Time 8   Period Weeks   Status New               Plan - 04/17/16 1443    Clinical Impression Statement Patient was instructed in RUE and RLE exercises for strengthening and is able to perform exercises without pain behaviors or increasing right wrist pain. He will continue to benefit from skilled PT to improve strength and functional use of RUe.   Rehab Potential Good   PT Frequency 2x / week   PT Duration 8 weeks   PT Treatment/Interventions Canalith Repostioning;Cryotherapy;Moist Heat;Traction;Ultrasound;Therapeutic exercise;Therapeutic activities;Manual techniques;Electrical Stimulation;Neuromuscular re-education;Balance training;Patient/family education;Passive range of motion;Vestibular   PT Next Visit Plan manual therapy, dix hall pike after get signed order for dizziness   PT Home Exercise Plan theraputty    Consulted and Agree with Plan of Care Patient;Family member/caregiver      Patient will benefit from skilled therapeutic intervention in order to improve the following deficits and impairments:  Decreased range of motion, Decreased strength, Pain, Dizziness  Visit Diagnosis: Muscle weakness (generalized)  Cervicalgia  Dizziness and giddiness     Problem List Patient Active Problem List   Diagnosis Date Noted  . Personal history of colonic polyps 06/14/2011  . CAD (coronary artery disease) 07/10/2010  . Dyspnea 06/10/2010  . OSA (obstructive sleep apnea) 06/10/2010  . Hypertension 06/10/2010  . COPD (chronic obstructive pulmonary disease) ([place]) 06/10/2010  . Hyperlipidemia 06/10/2010  . Retinal artery occlusion  06/10/2010   Alanson Puls, PT, DPT [place], [place] S 04/17/2016, 2:45 PM  [place] MAIN [facility] SERVICES [address] [place], [place], [address] Phone: [phone]   Fax:  [phone]  Name: Jeffery Fernandez MRN: [id number] Date of Birth: 1945-08-04

## 2016-04-22 ENCOUNTER — Encounter: Payer: Self-pay | Admitting: Physical Therapy

## 2016-04-22 ENCOUNTER — Ambulatory Visit: Payer: Medicare Other | Admitting: Physical Therapy

## 2016-04-22 DIAGNOSIS — M6281 Muscle weakness (generalized): Secondary | ICD-10-CM

## 2016-04-22 DIAGNOSIS — M542 Cervicalgia: Secondary | ICD-10-CM

## 2016-04-22 DIAGNOSIS — R42 Dizziness and giddiness: Secondary | ICD-10-CM

## 2016-04-22 NOTE — Therapy (Signed)
[place] MAIN [facility] SERVICES [address] [place], [place], [address] Phone: [phone]   Fax:  [phone]  Physical Therapy Treatment  Patient Details  Name: Jeffery Fernandez MRN: [id number] Date of Birth: 06/04/1945 Referring Provider: Melvenia Beam,  Encounter Date: 04/22/2016      PT End of Session - 04/22/16 1441    Visit Number 3   Number of Visits 17   Date for PT Re-Evaluation 06/10/16   Authorization Type 3 /10 g codes   PT Start Time 0232   PT Stop Time 0315   PT Time Calculation (min) 43 min   Activity Tolerance Patient tolerated treatment well;Patient limited by fatigue;Patient limited by pain   Behavior During Therapy [facility] for tasks assessed/performed      Past Medical History:  Diagnosis Date  . Arrhythmia   . Chest pain, unspecified   . Chronic airway obstruction, not elsewhere classified   . Colon polyps    Adenomatous Polyps 2007  . Depression   . Diabetes mellitus   . Diverticulosis   . Gout   . Hypertension   . Nephrolithiasis   . Obstructive sleep apnea (adult) (pediatric)   . Other and unspecified hyperlipidemia   . Other left bundle branch block   . Retinal vascular occlusion, unspecified    right  . Shortness of breath   . Unspecified sleep apnea   . Vision loss of right eye     Past Surgical History:  Procedure Laterality Date  . CATARACT EXTRACTION Bilateral   . KIDNEY STONE SURGERY      There were no vitals filed for this visit.      Subjective Assessment - 04/22/16 1439    Subjective Patient is reporting that his right wrist is sore from using the theraputty and his neck is a littlle sore.    Pertinent History Pateint ambualtes without AD and he is having weakness in RUE and does not feel that he does not have good control of it. He has difficutly with lifting buckets of water to wash the car. This episode started 6 monhts ago. Patient is having a nerve conduction test next week. He had a  MRI of his brain and performed a doppler of the carotid artery  but dont have the results. He also had a CT scan of his head.    Limitations Lifting;House hold activities   How long can you sit comfortably? unlimited   How long can you stand comfortably? unlimited   How long can you walk comfortably? normal distances and long distances   Patient Stated Goals He wants to get his right arm stronger and perform house hold chores using his RUE   Currently in Pain? Yes   Pain Score 2    Pain Location Neck   Pain Orientation Left   Pain Descriptors / Indicators Sore   Pain Type Acute pain   Pain Radiating Towards not radiating   Pain Onset Today   Pain Frequency Intermittent   Aggravating Factors  moving his neck   Pain Relieving Factors heat   Effect of Pain on Daily Activities none   Multiple Pain Sites No      Treatment: Leg press x 100 lbs x 20 x 3, heel raises x 20 x 3 75 lbs UBE x 2 1/2 mins fwd , 2 1/2 mins bwds standing hip abd with YTB x 20  side stepping left and right in parallel bars 10 feet x 3 step  ups from floor to 6 inch stool x 20 bilateral sit to stand x 10 marching in parallel bars x 20 sidelying RUE shoulder abd with 2 1/2 lbs x 20 x 2 Supine shoulder flex with 7 1/2 lbs x 20 x 2 Matrix scapula retraction x 20 with 7. 5 lbs Matrix LE hip abd/ hip extension x 15 x 2  Patient needs cues for posture correction and  Correct technique . Patient has some fatigue and no increased pain to neck.                            PT Education - 04/22/16 1440    Education provided Yes   Education Details HEP   Person(s) Educated Patient   Methods Explanation   Comprehension Verbalized understanding             PT Long Term Goals - 04/15/16 1700      PT LONG TERM GOAL #1   Title Patient will be independent in home exercise program to improve strength/mobility for better functional independence with ADLs.   Time 8   Period Weeks   Status New      PT LONG TERM GOAL #2   Title Patient will increase BLE gross strength to 4+/5 as to improve functional strength for independent gait, increased standing tolerance and increased ADL ability.   Baseline 3+/5 RUE, 4/5 RLE   Time 8   Period Weeks   Status New     PT LONG TERM GOAL #3   Title Patient will report a worst pain of 1/10 on VAS in  neck           to improve tolerance with ADLs and reduced symptoms with activities.    Baseline 3/10   Time 8   Period Weeks   Status New     PT LONG TERM GOAL #4   Title Patient will decrease quick dash to demonstrate improved functional mobility and increased tolerance with ADLs   Baseline 40.90   Time 8   Period Weeks   Status New               Plan - 04/22/16 1441    Clinical Impression Statement Patient required min verbal cues to perform 3 way hip with red theraband correctly and required verbal and tactile cues during all dynamic standing balance activities, ans strengthening exercises for RUE and RLE. Patient demonstrated decreased gait speed, decreased step height, and decreased step length. Patient will continue to benefit from skilled therapy in order to improve dynamic standing balance and increase gait speed to reduce risk for falls   Rehab Potential Good   PT Frequency 2x / week   PT Duration 8 weeks   PT Treatment/Interventions Canalith Repostioning;Cryotherapy;Moist Heat;Traction;Ultrasound;Therapeutic exercise;Therapeutic activities;Manual techniques;Electrical Stimulation;Neuromuscular re-education;Balance training;Patient/family education;Passive range of motion;Vestibular   PT Next Visit Plan manual therapy, dix hall pike after get signed order for dizziness   PT Home Exercise Plan theraputty    Consulted and Agree with Plan of Care Patient;Family member/caregiver      Patient will benefit from skilled therapeutic intervention in order to improve the following deficits and impairments:  Decreased range of motion,  Decreased strength, Pain, Dizziness  Visit Diagnosis: Muscle weakness (generalized)  Cervicalgia  Dizziness and giddiness     Problem List Patient Active Problem List   Diagnosis Date Noted  . Personal history of colonic polyps 06/14/2011  . CAD (coronary artery disease) 07/10/2010  .  Dyspnea 06/10/2010  . OSA (obstructive sleep apnea) 06/10/2010  . Hypertension 06/10/2010  . COPD (chronic obstructive pulmonary disease) ([place]) 06/10/2010  . Hyperlipidemia 06/10/2010  . Retinal artery occlusion 06/10/2010  Alanson Puls, PT, DPT  [place], [place] S 04/22/2016, 2:59 PM  [place] MAIN [facility] SERVICES [address] [place], [place], [address] Phone: [phone]   Fax:  [phone]  Name: Jeffery Fernandez MRN: [id number] Date of Birth: Nov 24, 1945

## 2016-04-24 ENCOUNTER — Encounter: Payer: Self-pay | Admitting: Physical Therapy

## 2016-04-24 ENCOUNTER — Ambulatory Visit: Payer: Medicare Other | Admitting: Physical Therapy

## 2016-04-24 DIAGNOSIS — M6281 Muscle weakness (generalized): Secondary | ICD-10-CM | POA: Diagnosis not present

## 2016-04-24 DIAGNOSIS — M542 Cervicalgia: Secondary | ICD-10-CM

## 2016-04-24 DIAGNOSIS — R42 Dizziness and giddiness: Secondary | ICD-10-CM

## 2016-04-24 NOTE — Therapy (Signed)
[place] MAIN [place] SERVICES [address] [place], [place], [address] Phone: [phone]   Fax:  [phone]  Physical Therapy Treatment  Patient Details  Name: Jeffery Fernandez MRN: [id number] Date of Birth: 06-29-1945 Referring Provider: Melvenia Beam,  Encounter Date: 04/24/2016      PT End of Session - 04/24/16 1439    Visit Number 4   Number of Visits 17   Date for PT Re-Evaluation 06/10/16   Authorization Type 4 /10 g codes   PT Start Time 0230   PT Stop Time 0315   PT Time Calculation (min) 45 min   Activity Tolerance Patient tolerated treatment well;Patient limited by fatigue;Patient limited by pain   Behavior During Therapy [facility] for tasks assessed/performed      Past Medical History:  Diagnosis Date  . Arrhythmia   . Chest pain, unspecified   . Chronic airway obstruction, not elsewhere classified   . Colon polyps    Adenomatous Polyps 2007  . Depression   . Diabetes mellitus   . Diverticulosis   . Gout   . Hypertension   . Nephrolithiasis   . Obstructive sleep apnea (adult) (pediatric)   . Other and unspecified hyperlipidemia   . Other left bundle branch block   . Retinal vascular occlusion, unspecified    right  . Shortness of breath   . Unspecified sleep apnea   . Vision loss of right eye     Past Surgical History:  Procedure Laterality Date  . CATARACT EXTRACTION Bilateral   . KIDNEY STONE SURGERY      There were no vitals filed for this visit.      Subjective Assessment - 04/24/16 1437    Subjective Patient is reporting that his right arm and legs are a little sore. He reports that his neck is not hurting.    Pertinent History Pateint ambualtes without AD and he is having weakness in RUE and does not feel that he does not have good control of it. He has difficutly with lifting buckets of water to wash the car. This episode started 6 monhts ago. Patient is having a nerve conduction test next week. He had a  MRI of his brain and performed a doppler of the carotid artery  but dont have the results. He also had a CT scan of his head.    Limitations Lifting;House hold activities   How long can you sit comfortably? unlimited   How long can you stand comfortably? unlimited   How long can you walk comfortably? normal distances and long distances   Patient Stated Goals He wants to get his right arm stronger and perform house hold chores using his RUE   Currently in Pain? No/denies   Pain Score 0-No pain   Pain Onset Today   Multiple Pain Sites No        Therapeutic exercise:  Quantum double leg press 100 x 20 x 3 ;;cues to not snap knees Heel raises with UE support , 2 x 10;cues for not flexing trunk fwd  Mini squats with RTB around knees to prevent valgus 2 x 10;; cues for correct posture RTB side stepping in // bars 4 lengths x 2; Sit to stand without UE support RTB around knees to prevent valgus 2 x 10; cues for correct technique Standing exercises with RTB BLE  Marching 2 x 10;cues for raising up LE's high enough SLR 2 x 10;cues to keep trunk errect Abduction 2 x 10;cues  for performing slowly Extension 2 x 10;cues to not flex fwd Knee flexion 2 x 10; cues to bend knee higher Heel raises 2 x 10;cues to raise up heels and not rock fwd Eccentric step downs x 10 BLE, cues to not put weight on hands Squats x 10 with 5 sec hold, cues for correct posture Resisted side-steeping RTB 4 lengths x 2;cues to not rotate pelivs Standing mini squats 2 x 10 with RTB around knees to encourage abduction;cues to hold for 3 seconds Step-ups to 6" step x 10 bilateral;; cues for head posititon                           PT Education - 04/24/16 1439    Education provided Yes   Education Details HEP   Person(s) Educated Patient   Methods Explanation   Comprehension Verbalized understanding             PT Long Term Goals - 04/15/16 1700      PT LONG TERM GOAL #1   Title  Patient will be independent in home exercise program to improve strength/mobility for better functional independence with ADLs.   Time 8   Period Weeks   Status New     PT LONG TERM GOAL #2   Title Patient will increase BLE gross strength to 4+/5 as to improve functional strength for independent gait, increased standing tolerance and increased ADL ability.   Baseline 3+/5 RUE, 4/5 RLE   Time 8   Period Weeks   Status New     PT LONG TERM GOAL #3   Title Patient will report a worst pain of 1/10 on VAS in  neck           to improve tolerance with ADLs and reduced symptoms with activities.    Baseline 3/10   Time 8   Period Weeks   Status New     PT LONG TERM GOAL #4   Title Patient will decrease quick dash to demonstrate improved functional mobility and increased tolerance with ADLs   Baseline 40.90   Time 8   Period Weeks   Status New               Plan - 04/24/16 1439    Clinical Impression Statement Continuous verbal cues and tactile cues needed to correct form with exercises and for posture correction. Patient is able to perform dynamic standing exercises and strengthening exercises  without reports of pain. Patient does need CGA during dynamic standing balance training .Focused on improving static/dynamic balance while improving LE strength and patient demonstrated increased postural sway and required use of UE support to perform exercises indicating decreased balance. Patient will benefit from further skilled therapy to return to prior level of function.   Rehab Potential Good   PT Frequency 2x / week   PT Duration 8 weeks   PT Treatment/Interventions Canalith Repostioning;Cryotherapy;Moist Heat;Traction;Ultrasound;Therapeutic exercise;Therapeutic activities;Manual techniques;Electrical Stimulation;Neuromuscular re-education;Balance training;Patient/family education;Passive range of motion;Vestibular   PT Next Visit Plan manual therapy, dix hall pike after get signed  order for dizziness   PT Home Exercise Plan theraputty    Consulted and Agree with Plan of Care Patient;Family member/caregiver      Patient will benefit from skilled therapeutic intervention in order to improve the following deficits and impairments:  Decreased range of motion, Decreased strength, Pain, Dizziness  Visit Diagnosis: Muscle weakness (generalized)  Cervicalgia  Dizziness and giddiness     Problem  List Patient Active Problem List   Diagnosis Date Noted  . Personal history of colonic polyps 06/14/2011  . CAD (coronary artery disease) 07/10/2010  . Dyspnea 06/10/2010  . OSA (obstructive sleep apnea) 06/10/2010  . Hypertension 06/10/2010  . COPD (chronic obstructive pulmonary disease) ([place]) 06/10/2010  . Hyperlipidemia 06/10/2010  . Retinal artery occlusion 06/10/2010  Alanson Puls, PT, DPT  [place], [place] S 04/24/2016, 3:10 PM  [place] MAIN [facility] SERVICES [place], [place], [address] Phone: [phone]   Fax:  [phone]  Name: UEL DAVIDOW MRN: [id number] Date of Birth: Mar 25, 1945

## 2016-04-28 ENCOUNTER — Ambulatory Visit
Admission: RE | Admit: 2016-04-28 | Discharge: 2016-04-28 | Disposition: A | Discharge status: Home / Self Care | Payer: Medicare Other | Source: Ambulatory Visit | Attending: Neurology | Admitting: Neurology

## 2016-04-28 DIAGNOSIS — M5412 Radiculopathy, cervical region: Secondary | ICD-10-CM

## 2016-04-28 DIAGNOSIS — R531 Weakness: Secondary | ICD-10-CM

## 2016-04-28 DIAGNOSIS — M542 Cervicalgia: Secondary | ICD-10-CM

## 2016-04-29 ENCOUNTER — Ambulatory Visit: Payer: Medicare Other | Attending: Neurology | Admitting: Physical Therapy

## 2016-04-29 ENCOUNTER — Encounter: Payer: Self-pay | Admitting: Physical Therapy

## 2016-04-29 ENCOUNTER — Telehealth: Payer: Self-pay | Admitting: Neurology

## 2016-04-29 DIAGNOSIS — M542 Cervicalgia: Secondary | ICD-10-CM | POA: Insufficient documentation

## 2016-04-29 DIAGNOSIS — R42 Dizziness and giddiness: Secondary | ICD-10-CM | POA: Insufficient documentation

## 2016-04-29 DIAGNOSIS — R9389 Abnormal findings on diagnostic imaging of other specified body structures: Secondary | ICD-10-CM

## 2016-04-29 DIAGNOSIS — M6281 Muscle weakness (generalized): Secondary | ICD-10-CM | POA: Diagnosis present

## 2016-04-29 NOTE — Telephone Encounter (Signed)
Pt's [name] called back to discuss results. She can be reached at (c) [phone]

## 2016-04-29 NOTE — Telephone Encounter (Signed)
Dr. Jaynee Eagles is out of town so I called to discuss the results of the cervical spine MRI with Mr. and Mrs. Mangano.   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     I recommend that he be evaluated by neurosurgery.     I let them know that I would forward this message to Dr. Jaynee Eagles. They're going to also discuss with their daughter who may give a call later on and we have permission to discuss the results with her.

## 2016-04-29 NOTE — Therapy (Addendum)
[place] MAIN [facility] SERVICES [address] [place], [place], [address] Phone: [phone]   Fax:  [phone]  Physical Therapy Treatment  Patient Details  Name: Jeffery Fernandez MRN: [id number] Date of Birth: 12/07/1945 Referring Provider: Melvenia Beam,  Encounter Date: 04/29/2016      PT End of Session - 04/29/16 1351    Visit Number 5   Number of Visits 17   Date for PT Re-Evaluation 06/10/16   Authorization Type 5 /10 g codes   PT Start Time 0145   PT Stop Time 0230   PT Time Calculation (min) 45 min   Activity Tolerance Patient tolerated treatment well;Patient limited by fatigue;Patient limited by pain   Behavior During Therapy [facility] for tasks assessed/performed      Past Medical History:  Diagnosis Date  . Arrhythmia   . Chest pain, unspecified   . Chronic airway obstruction, not elsewhere classified   . Colon polyps    Adenomatous Polyps 2007  . Depression   . Diabetes mellitus   . Diverticulosis   . Gout   . Hypertension   . Nephrolithiasis   . Obstructive sleep apnea (adult) (pediatric)   . Other and unspecified hyperlipidemia   . Other left bundle branch block   . Retinal vascular occlusion, unspecified    right  . Shortness of breath   . Unspecified sleep apnea   . Vision loss of right eye     Past Surgical History:  Procedure Laterality Date  . CATARACT EXTRACTION Bilateral   . KIDNEY STONE SURGERY      There were no vitals filed for this visit.      Subjective Assessment - 04/29/16 1349    Subjective Patient is reporting that his right arm and legs are a little sore. He reports that his neck is not hurting. Patient had an MRI yesterday on his neck. Patient says he has nore strength in his right arm and his wife and daughter notice it too.   Pertinent History Pateint ambualtes without AD and he is having weakness in RUE and does not feel that he does not have good control of it. He has difficutly with  lifting buckets of water to wash the car. This episode started 6 monhts ago. Patient is having a nerve conduction test next week. He had a MRI of his brain and performed a doppler of the carotid artery  but dont have the results. He also had a CT scan of his head.    Limitations Lifting;House hold activities   How long can you sit comfortably? unlimited   How long can you stand comfortably? unlimited   How long can you walk comfortably? normal distances and long distances   Patient Stated Goals He wants to get his right arm stronger and perform house hold chores using his RUE   Currently in Pain? No/denies   Pain Score 0-No pain   Pain Onset Today       Therapeutic exercise:  Quantum double leg press 130 x 20 x 3 ;;cues to not snap knees Heel raises with UE support , 2 x 10;cues for not flexing trunk fwd  UBE x 5 mins fwd and bwd level 2 RTB side stepping in // bars 4 lengths x 2 Standing exercises with RTB BLE  Standing UE reptraction with 12.5 lbs Heel raises 2 x 10;cues to raise up heels and not rock fwd Matrix PNF 1, PNF 2 12.5 lbs x 15 RUE Resisted  side-steeping RTB 4 lengths x 2;cues to not rotate pelivs Standing mini squats 2 x 10 with RTB around knees to encourage abduction;cues to hold for 3 seconds Step-ups to 6" step x 10 bilateral;; cues for head posititon Matrix hip ext 12/.5 lbs x 20 x 2 BLE, cue for not fwd flexing trunk                           PT Education - 04/29/16 1351    Education provided Yes   Education Details HEP   Person(s) Educated Patient   Methods Explanation   Comprehension Verbalized understanding             PT Long Term Goals - 04/15/16 1700      PT LONG TERM GOAL #1   Title Patient will be independent in home exercise program to improve strength/mobility for better functional independence with ADLs.   Time 8   Period Weeks   Status New     PT LONG TERM GOAL #2   Title Patient will increase BLE gross strength  to 4+/5 as to improve functional strength for independent gait, increased standing tolerance and increased ADL ability.   Baseline 3+/5 RUE, 4/5 RLE   Time 8   Period Weeks   Status New     PT LONG TERM GOAL #3   Title Patient will report a worst pain of 1/10 on VAS in  neck           to improve tolerance with ADLs and reduced symptoms with activities.    Baseline 3/10   Time 8   Period Weeks   Status New     PT LONG TERM GOAL #4   Title Patient will decrease quick dash to demonstrate improved functional mobility and increased tolerance with ADLs   Baseline 40.90   Time 8   Period Weeks   Status New               Plan - 04/29/16 1352    Clinical Impression Statement Patient was instructed in strengthening of RUE and RLE with cues for correct form and technique. He has increase RUE strength and is able to perform increased weight with UE exercises. He has fatigue midway through treatment and will continue to benefit from skilled PT to improve strength.    Rehab Potential Good   PT Frequency 2x / week   PT Duration 8 weeks   PT Treatment/Interventions Canalith Repostioning;Cryotherapy;Moist Heat;Traction;Ultrasound;Therapeutic exercise;Therapeutic activities;Manual techniques;Electrical Stimulation;Neuromuscular re-education;Balance training;Patient/family education;Passive range of motion;Vestibular   PT Next Visit Plan manual therapy, dix hall pike after get signed order for dizziness   PT Home Exercise Plan theraputty    Consulted and Agree with Plan of Care Patient;Family member/caregiver      Patient will benefit from skilled therapeutic intervention in order to improve the following deficits and impairments:  Decreased range of motion, Decreased strength, Pain, Dizziness  Visit Diagnosis: Muscle weakness (generalized)  Cervicalgia  Dizziness and giddiness     Problem List Patient Active Problem List   Diagnosis Date Noted  . Personal history of colonic  polyps 06/14/2011  . CAD (coronary artery disease) 07/10/2010  . Dyspnea 06/10/2010  . OSA (obstructive sleep apnea) 06/10/2010  . Hypertension 06/10/2010  . COPD (chronic obstructive pulmonary disease) ([place]) 06/10/2010  . Hyperlipidemia 06/10/2010  . Retinal artery occlusion 06/10/2010   Alanson Puls, PT, DPT [place], [place] S 04/29/2016, 1:57 PM  [place]  [place] MAIN [facility] SERVICES [place], [place], [address] Phone: [phone]   Fax:  [phone]  Name: Jeffery Fernandez MRN: [id number] Date of Birth: 1945-03-11

## 2016-04-30 NOTE — Addendum Note (Signed)
Addended by: Monte Fantasia on: 04/30/2016 01:47 PM   Modules accepted: Orders

## 2016-04-30 NOTE — Telephone Encounter (Signed)
Called daughter to review MRI results. Would like to proceed w/ neurosurgery consult. Referral orders placed in EPIC. Daughter asks if pt should continue PT which has helped w/ strengthening per report. Also if pt should keep appt for NCV/EMG on 05/23/16.

## 2016-04-30 NOTE — Telephone Encounter (Signed)
Ok to continue PT.    Jeffery Fernandez can decide on the NCV/EMG fter she returns

## 2016-05-01 ENCOUNTER — Telehealth: Payer: Self-pay | Admitting: Neurology

## 2016-05-01 ENCOUNTER — Encounter: Payer: Self-pay | Admitting: Physical Therapy

## 2016-05-01 ENCOUNTER — Ambulatory Visit: Payer: Medicare Other | Admitting: Physical Therapy

## 2016-05-01 DIAGNOSIS — M6281 Muscle weakness (generalized): Secondary | ICD-10-CM | POA: Diagnosis not present

## 2016-05-01 DIAGNOSIS — M542 Cervicalgia: Secondary | ICD-10-CM

## 2016-05-01 DIAGNOSIS — R42 Dizziness and giddiness: Secondary | ICD-10-CM

## 2016-05-01 NOTE — Therapy (Signed)
[place] MAIN [facility] SERVICES [address] [place], [place], [address] Phone: [phone]   Fax:  [phone]  Physical Therapy Treatment  Patient Details  Name: Jeffery Fernandez MRN: [id number] Date of Birth: [birth year] Referring Provider: Melvenia Beam,  Encounter Date: 05/01/2016      PT End of Session - 05/01/16 1426    Visit Number 6   Number of Visits 17   Date for PT Re-Evaluation 06/10/16   Authorization Type 6 /10 g codes   PT Start Time 0210   PT Stop Time 0250   PT Time Calculation (min) 40 min   Activity Tolerance Patient tolerated treatment well;Patient limited by fatigue;Patient limited by pain   Behavior During Therapy [facility] for tasks assessed/performed      Past Medical History:  Diagnosis Date  . Arrhythmia   . Chest pain, unspecified   . Chronic airway obstruction, not elsewhere classified   . Colon polyps    Adenomatous Polyps 05/06/2005  . Depression   . Diabetes mellitus   . Diverticulosis   . Gout   . Hypertension   . Nephrolithiasis   . Obstructive sleep apnea (adult) (pediatric)   . Other and unspecified hyperlipidemia   . Other left bundle branch block   . Retinal vascular occlusion, unspecified    right  . Shortness of breath   . Unspecified sleep apnea   . Vision loss of right eye     Past Surgical History:  Procedure Laterality Date  . CATARACT EXTRACTION Bilateral   . KIDNEY STONE SURGERY      There were no vitals filed for this visit.      Subjective Assessment - 05/01/16 1423    Subjective Patient is reporting that his right arm and legs are a little sore. He reports that his neck is not hurting. Patient had an MRI 2022-05-07 on his neck. He has There is probable right C6 and possible left C6 nerve root compression., per MRI report.   Pertinent History Pateint ambualtes without AD and he is having weakness in RUE and does not feel that he does not have good control of it. He has difficutly with  lifting buckets of water to wash the car. This episode started 6 monhts ago. Patient is having a nerve conduction test next week. He had a MRI of his brain and performed a doppler of the carotid artery  but dont have the results. He also had a CT scan of his head.    Limitations Lifting;House hold activities   How long can you sit comfortably? unlimited   How long can you stand comfortably? unlimited   How long can you walk comfortably? normal distances and long distances   Patient Stated Goals He wants to get his right arm stronger and perform house hold chores using his RUE   Currently in Pain? No/denies   Pain Score 0-No pain   Pain Onset Today   Multiple Pain Sites No      Treatment: 3 way hip x 20 with RTB , cues for correct technique and posture Squats x 15 ,cues for correct technique and posture Leg press 130 lbs x 20 x 2,cues for correct technique and posture Heel raises 130 lbs x 20 x 2,cues for correct technique and posture Matrix hip ext and hip abd 12.5 lbs x 20 x 2 BLE,cues for correct technique and posture Step down eccentric 6 inc stool x 20 ,cues for correct technique and posture Matrix  stepping fwd/bwd/side to side x 20 ,cues for correct technique and posture                           PT Education - 05/01/16 1425    Education provided Yes   Education Details HEP   Person(s) Educated Patient   Methods Explanation   Comprehension Verbalized understanding             PT Long Term Goals - 04/15/16 1700      PT LONG TERM GOAL #1   Title Patient will be independent in home exercise program to improve strength/mobility for better functional independence with ADLs.   Time 8   Period Weeks   Status New     PT LONG TERM GOAL #2   Title Patient will increase BLE gross strength to 4+/5 as to improve functional strength for independent gait, increased standing tolerance and increased ADL ability.   Baseline 3+/5 RUE, 4/5 RLE   Time 8   Period  Weeks   Status New     PT LONG TERM GOAL #3   Title Patient will report a worst pain of 1/10 on VAS in  neck           to improve tolerance with ADLs and reduced symptoms with activities.    Baseline 3/10   Time 8   Period Weeks   Status New     PT LONG TERM GOAL #4   Title Patient will decrease quick dash to demonstrate improved functional mobility and increased tolerance with ADLs   Baseline 40.90   Time 8   Period Weeks   Status New               Plan - 05/01/16 1426    Clinical Impression Statement Patient has MRI results with is probable right C6 and possible left C6 nerve root compression. Patient performed LE exercises today without pain behaviors. He will continue to benefit from skilled PT to improve strength and improve mobility.   Rehab Potential Good   PT Frequency 2x / week   PT Duration 8 weeks   PT Treatment/Interventions Canalith Repostioning;Cryotherapy;Moist Heat;Traction;Ultrasound;Therapeutic exercise;Therapeutic activities;Manual techniques;Electrical Stimulation;Neuromuscular re-education;Balance training;Patient/family education;Passive range of motion;Vestibular   PT Next Visit Plan manual therapy, dix hall pike after get signed order for dizziness   PT Home Exercise Plan theraputty    Consulted and Agree with Plan of Care Patient;Family member/caregiver      Patient will benefit from skilled therapeutic intervention in order to improve the following deficits and impairments:  Decreased range of motion, Decreased strength, Pain, Dizziness  Visit Diagnosis: Muscle weakness (generalized)  Cervicalgia  Dizziness and giddiness     Problem List Patient Active Problem List   Diagnosis Date Noted  . Personal history of colonic polyps 06/14/2011  . CAD (coronary artery disease) 07/10/2010  . Dyspnea 06/10/2010  . OSA (obstructive sleep apnea) 06/10/2010  . Hypertension 06/10/2010  . COPD (chronic obstructive pulmonary disease) ([place]) 06/10/2010   . Hyperlipidemia 06/10/2010  . Retinal artery occlusion 06/10/2010   Alanson Puls, PT, DPT [place], [place] S 05/01/2016, 2:30 PM  [place] MAIN [facility] SERVICES [address] [place], [place], [address] Phone: [phone]   Fax:  [phone]  Name: OAKLYN MANS MRN: [id number] Date of Birth: [birth year]

## 2016-05-01 NOTE — Telephone Encounter (Signed)
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 This is likely causing his arm weakness. He needs to go to neurosurgery this week for evaluation. Looks like Dr. Felecia Shelling already discussed with him and placed referral. He does not need emg/ncs next month thanks  IMPRESSION:  This MRI of the cervical spine shows the following: 1.    There is myelopathic signal within the spinal cord adjacent to the C4-C5 interspace. At this level, there is moderately severe spinal stenosis and moderately severe right foraminal narrowing at could lead to right C5 nerve root compression. 2.    At C5-C6, there is moderately severe spinal stenosis, severe right foraminal narrowing and moderately severe left foraminal narrowing. There is probable right C6 and possible left C6 nerve root compression. 3.   There is also moderate spinal stenosis at C3-C4 and mild spinal stenosis at C6-C7 with less potential for nerve root compression.

## 2016-05-01 NOTE — Telephone Encounter (Signed)
Spoke to pt's daughter and advised that NCV/EMG could be cancelled at this time. Pt will proceed w/ neurosurg referral.

## 2016-05-02 ENCOUNTER — Encounter: Payer: Self-pay | Admitting: Gastroenterology

## 2016-05-08 ENCOUNTER — Encounter: Payer: Self-pay | Admitting: Physical Therapy

## 2016-05-08 ENCOUNTER — Ambulatory Visit: Payer: Medicare Other | Admitting: Physical Therapy

## 2016-05-08 DIAGNOSIS — R42 Dizziness and giddiness: Secondary | ICD-10-CM

## 2016-05-08 DIAGNOSIS — M6281 Muscle weakness (generalized): Secondary | ICD-10-CM

## 2016-05-08 DIAGNOSIS — M542 Cervicalgia: Secondary | ICD-10-CM

## 2016-05-08 NOTE — Therapy (Signed)
[place] MAIN [facility] SERVICES [address] [place], [place], [address] Phone: [phone]   Fax:  [phone]  Physical Therapy Treatment  Patient Details  Name: Jeffery Fernandez MRN: [id number] Date of Birth: October 30, 1945 Referring Provider: Melvenia Beam,  Encounter Date: 05/08/2016      PT End of Session - 05/08/16 1317    Visit Number 7   Number of Visits 17   Date for PT Re-Evaluation 06/10/16   Authorization Type 7 /10 g codes   PT Start Time 0105   PT Stop Time 0145   PT Time Calculation (min) 40 min   Equipment Utilized During Treatment Gait belt   Activity Tolerance Patient tolerated treatment well;Patient limited by fatigue;Patient limited by pain   Behavior During Therapy [facility] for tasks assessed/performed      Past Medical History:  Diagnosis Date  . Arrhythmia   . Chest pain, unspecified   . Chronic airway obstruction, not elsewhere classified   . Colon polyps    Adenomatous Polyps 2007  . Depression   . Diabetes mellitus   . Diverticulosis   . Gout   . Hypertension   . Nephrolithiasis   . Obstructive sleep apnea (adult) (pediatric)   . Other and unspecified hyperlipidemia   . Other left bundle branch block   . Retinal vascular occlusion, unspecified    right  . Shortness of breath   . Unspecified sleep apnea   . Vision loss of right eye     Past Surgical History:  Procedure Laterality Date  . CATARACT EXTRACTION Bilateral   . KIDNEY STONE SURGERY      There were no vitals filed for this visit.      Subjective Assessment - 05/08/16 1314    Subjective Patient is reporting that his right arm and legs are a little sore. He reports that his neck is not hurting. He has an appointment for getting information about his recent MRI findings.   Pertinent History Pateint ambualtes without AD and he is having weakness in RUE and does not feel that he does not have good control of it. He has difficutly with lifting  buckets of water to wash the car. This episode started 6 monhts ago. Patient is having a nerve conduction test next week. He had a MRI of his brain and performed a doppler of the carotid artery  but dont have the results. He also had a CT scan of his head.    Limitations Lifting;House hold activities   How long can you sit comfortably? unlimited   How long can you stand comfortably? unlimited   How long can you walk comfortably? normal distances and long distances   Patient Stated Goals He wants to get his right arm stronger and perform house hold chores using his RUE   Currently in Pain? Yes   Pain Score 0-No pain   Pain Onset Today   Multiple Pain Sites No      Therapeutic exercise and neuromuscular training:  1/2 foam flat side up and balance with head turns left and right feet apart and feet together,,cues for balance and posture x 2 mins   tandem standing on 1/2 foam  , cues for balance and posture, 2 mins  standing hip abd with GTB x 20  ,cues for balance and posture  side stepping left and right in parallel bars 10 feet x 3, as fast as he can  step ups from floor to 6 inch stool  x 20 bilateral  Matrix rows with 12. 5 lbs x 20  RUE  D 1, D2 and  cues for balance and posture  Matrix RLE hip abd , hip ext 12. 5  Lbs x 20   Leg press 120 lbs x 20 x 3, heel raises 75 lbs x 20 x 3 BLE , cues to slow movement and not snap his knees.  Stepping over bolster left and right and fwd/bwd,cues for balance and posture, x 20 x 2  Tm walking 1. 5 m/hour x 5 mins, CGA  TM walking side stepping left and right x . 4 miles / hour, CGA, x 3 mins left and right   Patient needs occasional verbal cueing to improve posture and cueing to correctly perform exercises.                        PT Education - 05/08/16 1316    Education provided Yes   Education Details HEP progression   Person(s) Educated Patient   Methods Explanation   Comprehension Verbalized understanding              PT Long Term Goals - 04/15/16 1700      PT LONG TERM GOAL #1   Title Patient will be independent in home exercise program to improve strength/mobility for better functional independence with ADLs.   Time 8   Period Weeks   Status New     PT LONG TERM GOAL #2   Title Patient will increase BLE gross strength to 4+/5 as to improve functional strength for independent gait, increased standing tolerance and increased ADL ability.   Baseline 3+/5 RUE, 4/5 RLE   Time 8   Period Weeks   Status New     PT LONG TERM GOAL #3   Title Patient will report a worst pain of 1/10 on VAS in  neck           to improve tolerance with ADLs and reduced symptoms with activities.    Baseline 3/10   Time 8   Period Weeks   Status New     PT LONG TERM GOAL #4   Title Patient will decrease quick dash to demonstrate improved functional mobility and increased tolerance with ADLs   Baseline 40.90   Time 8   Period Weeks   Status New               Plan - 05/08/16 1317    Clinical Impression Statement Patient instructed in advanced LE and UE  strengthening; patient required min-mod VCs for correct positioning; He had increased difficulty with dynamic steps and quick movements especially unsupported; Patient would benefit from additional skilled PT intervention to improve strength, balance and return to PLOF.   Rehab Potential Good   PT Frequency 2x / week   PT Duration 8 weeks   PT Treatment/Interventions Canalith Repostioning;Cryotherapy;Moist Heat;Traction;Ultrasound;Therapeutic exercise;Therapeutic activities;Manual techniques;Electrical Stimulation;Neuromuscular re-education;Balance training;Patient/family education;Passive range of motion;Vestibular   PT Next Visit Plan manual therapy, dix hall pike after get signed order for dizziness   PT Home Exercise Plan theraputty    Consulted and Agree with Plan of Care Patient;Family member/caregiver      Patient will benefit from  skilled therapeutic intervention in order to improve the following deficits and impairments:  Decreased range of motion, Decreased strength, Pain, Dizziness  Visit Diagnosis: Muscle weakness (generalized)  Cervicalgia  Dizziness and giddiness     Problem List Patient Active Problem List  Diagnosis Date Noted  . Personal history of colonic polyps 06/14/2011  . CAD (coronary artery disease) 07/10/2010  . Dyspnea 06/10/2010  . OSA (obstructive sleep apnea) 06/10/2010  . Hypertension 06/10/2010  . COPD (chronic obstructive pulmonary disease) ([place]) 06/10/2010  . Hyperlipidemia 06/10/2010  . Retinal artery occlusion 06/10/2010  Alanson Puls, PT, DPT [place], [place] S 05/08/2016, 3:24 PM  [place] MAIN [facility] SERVICES [address] [place], [place], [address] Phone: [phone]   Fax:  [phone]  Name: Jeffery Fernandez MRN: [id number] Date of Birth: 11-Jul-1945

## 2016-05-10 DIAGNOSIS — M4712 Other spondylosis with myelopathy, cervical region: Secondary | ICD-10-CM | POA: Insufficient documentation

## 2016-05-10 DIAGNOSIS — G8929 Other chronic pain: Secondary | ICD-10-CM | POA: Insufficient documentation

## 2016-05-13 ENCOUNTER — Ambulatory Visit: Payer: Medicare Other | Admitting: Physical Therapy

## 2016-05-14 ENCOUNTER — Other Ambulatory Visit: Payer: Self-pay | Admitting: Neurosurgery

## 2016-05-15 ENCOUNTER — Ambulatory Visit: Payer: Medicare Other | Admitting: Physical Therapy

## 2016-05-20 ENCOUNTER — Ambulatory Visit: Payer: Medicare Other | Admitting: Physical Therapy

## 2016-05-20 ENCOUNTER — Encounter: Payer: Medicare Other | Admitting: Physical Therapy

## 2016-05-22 ENCOUNTER — Ambulatory Visit: Payer: Medicare Other | Admitting: Physical Therapy

## 2016-05-23 ENCOUNTER — Encounter: Payer: Medicare Other | Admitting: Neurology

## 2016-06-04 ENCOUNTER — Ambulatory Visit (INDEPENDENT_AMBULATORY_CARE_PROVIDER_SITE_OTHER): Payer: Medicare Other | Admitting: Gastroenterology

## 2016-06-04 ENCOUNTER — Encounter: Payer: Self-pay | Admitting: Gastroenterology

## 2016-06-04 ENCOUNTER — Encounter (INDEPENDENT_AMBULATORY_CARE_PROVIDER_SITE_OTHER): Payer: Self-pay

## 2016-06-04 VITALS — BP 164/76 | HR 60 | Ht 70.5 in | Wt 213.5 lb

## 2016-06-04 DIAGNOSIS — I639 Cerebral infarction, unspecified: Secondary | ICD-10-CM | POA: Diagnosis not present

## 2016-06-04 DIAGNOSIS — R195 Other fecal abnormalities: Secondary | ICD-10-CM | POA: Diagnosis not present

## 2016-06-04 DIAGNOSIS — Z86010 Personal history of colonic polyps: Secondary | ICD-10-CM

## 2016-06-04 NOTE — Patient Instructions (Signed)
If you are age 73 or older, your body mass index should be between 23-30. Your Body mass index is 30.2 kg/m. If this is out of the aforementioned range listed, please consider follow up with your Primary Care Provider.  If you are age 20 or younger, your body mass index should be between 19-25. Your Body mass index is 30.2 kg/m. If this is out of the aformentioned range listed, please consider follow up with your Primary Care Provider.   It has been recommended to you by your physician that you have a(n) colonoscopy completed. Per your request, we did not schedule the procedure(s) today. Please contact our office at [phone] should you decide to have the procedure completed.   Thank you for choosing  GI  Dr Wilfrid Lund III

## 2016-06-04 NOTE — Progress Notes (Signed)
[facility] Consult Note:  HistoryHOLGER Fernandez 06/04/2016  Referring physician: Dione Housekeeper, MD  Reason for consult/chief complaint: history of colonic polyps (pt here to discuss having a colonoscopy - on Plavix. Positive heme.)   Subjective  HPI:  This is a 71 year old man referred by primary care for heme positive stool. This was discovered during a clinic visit 2 or 3 months ago. His hemoglobin was normal at 12.6, with normal iron levels and a GFR of 54.  It is unclear whether or not he has followed up with primary care and had repeat labs. He was having some dyspepsia that resolved after PPI was started. H. pylori antibody was negative. The patient and his wife are somewhat concerned because he felt the symptoms might be reminiscent of an ulcer that was apparently diagnosed 40 years ago. This patient had a diminutive sigmoid sessile adenomatous polyp removed by Dr. Deatra Ina in 2013. The patient is maintained on aspirin and Plavix for a somewhat unclear reason. He has apparently had conflicting opinions on whether or not he may have had a stroke. 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 He denies bloody stool abdominal pain altered bowel habits, dysphagia, odynophagia, early satiety, nausea, vomiting or weight loss. The patient is scheduled for cervical fusion on 06/26/2016  ROS:  Review of Systems  Constitutional: Negative for appetite change and unexpected weight change.  HENT: Negative for mouth sores and voice change.   Eyes: Negative for pain and redness.  Respiratory: Negative for cough and shortness of breath.   Cardiovascular: Negative for chest pain and palpitations.  Genitourinary: Negative for dysuria and hematuria.  Musculoskeletal: Positive for neck pain. Negative for arthralgias and myalgias.       He has radicular symptoms in both arms due to his cervical spine condition.  Skin: Negative for pallor and rash.    Neurological: Negative for weakness and headaches.  Hematological: Negative for adenopathy.     Past Medical History: Past Medical History:  Diagnosis Date  . Anemia   . Arrhythmia   . Chest pain, unspecified   . Chronic airway obstruction, not elsewhere classified   . Colon polyps    Adenomatous Polyps 2007  . Depression   . Diabetes mellitus   . Diverticulosis   . GERD (gastroesophageal reflux disease)   . Gout   . Hyperlipemia   . Hypertension   . Nephrolithiasis   . Obstructive sleep apnea (adult) (pediatric)   . Other and unspecified hyperlipidemia   . Other left bundle branch block   . Retinal vascular occlusion, unspecified    right  . Shortness of breath   . Unspecified sleep apnea   . Vision loss of right eye      Past Surgical History: Past Surgical History:  Procedure Laterality Date  . CATARACT EXTRACTION Bilateral   . KIDNEY STONE SURGERY       Family History: Family History  Problem Relation Age of Onset  . Heart attack Brother 60    heart attack and CHF/ also some form of EP ablation  . Parkinson's disease Brother   . Heart disease Father   . Uterine cancer Mother   . Diabetes Brother   . Arthritis/Rheumatoid Cousin   . Colon cancer Neg Hx   . Stomach cancer Neg Hx   . Liver cancer Neg Hx   . Rectal cancer Neg Hx   . Esophageal cancer Neg Hx     Social  History: Social History   Social History  . Marital status: Married    Spouse name: Lelon Frohlich  . Number of children: 1  . Years of education: N/A   Occupational History  . 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   Social History Main Topics  . Smoking status: Former Smoker    Packs/day: 1.50    Years: 25.00    Types: Cigarettes    Quit date: 01/28/1986  . Smokeless tobacco: Former Systems developer  . Alcohol use No     Comment: Very rare  . Drug use: No  . Sexual activity: Not Asked     Comment: Married   Other Topics Concern  . 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 Larena Glassman from [place] and moved back there after retirement. He  Has a daughter living in [place]. Quit smoking around 1988. Married   Right-handed   Caffeine: soda daily, decaf coffee    Allergies: Allergies  Allergen Reactions  . Penicillins Other (See Comments)    unknow    Outpatient Meds: Current Outpatient Prescriptions  Medication Sig Dispense Refill  . aspirin 81 MG tablet Take 81 mg by mouth daily.      Marland Kitchen atorvastatin (LIPITOR) 10 MG tablet Take 40 mg by mouth daily.      . clopidogrel (PLAVIX) 75 MG tablet Take 75 mg by mouth daily.      Marland Kitchen glipiZIDE (GLUCOTROL XL) 10 MG 24 hr tablet TAKE 1 TABLET TWICE A DAY    . losartan-hydrochlorothiazide (HYZAAR) 100-25 MG tablet daily.    . metFORMIN (GLUCOPHAGE) 500 MG tablet Take 500 mg by mouth 2 (two) times daily with a meal.      . metoprolol (LOPRESSOR) 50 MG tablet Take 1 tablet (50 mg total) by mouth 2 (two) times daily.    . pantoprazole (PROTONIX) 40 MG tablet Take by mouth.    . sertraline (ZOLOFT) 50 MG tablet Take 50 mg by mouth daily.      . sitaGLIPtan (JANUVIA) 100 MG tablet Take 100 mg by mouth daily.      Marland Kitchen terazosin (HYTRIN) 2 MG capsule Take 2 mg by mouth at bedtime.       No current facility-administered medications for this visit.       ___________________________________________________________________ Objective   Exam:  BP (!) 164/76   Pulse 60   Ht 5' 10.5" (1.791 m)   Wt 213 lb 8 oz (96.8 kg)   BMI 30.20 kg/m    General: this is a(n) Overweight and otherwise well-appearing man   Eyes: sclera anicteric, no redness  ENT: oral mucosa moist without lesions, no cervical or supraclavicular lymphadenopathy, good dentition  CV: RRR without murmur, S1/S2, no JVD, no peripheral edema  Resp: clear to auscultation bilaterally, normal RR and effort noted  GI: soft, no tenderness, with active bowel sounds. No guarding or palpable  organomegaly noted.  Skin; warm and dry, no rash or jaundice noted  Neuro: awake, alert and oriented x 3. Normal gross motor function and fluent speech  Data filed in the chart from outside primary care records. See above for summary. He reportedly had a positive heme stool test as well. This is not included in the records, but the patient reports to me.  Assessment: Encounter Diagnoses  Name Primary?  . Heme positive stool Yes  . Personal history of colonic polyps  His brief dyspepsia sounds benign and resolved on PPI. He is H. pylori negative, thus I think is unlikely to have an ulcer. The significance of his heme positive test is unclear, especially with a normal hemoglobin and iron levels. He has a history of a diminutive colon polyp.  He should have an EGD and colonoscopy to rule out neoplasia as a source of occult blood loss. However, I think it should and can safely wait until after he recovers from his upcoming cervical surgery.  He will call me when he is recovered from surgery toward the end of June and is able to get out of his neck brace and feels that he is ready to schedule his procedures. If there is any clinical change in the meantime he will let me know.  Thank you for the courtesy of this consult.  Please call me with any questions or concerns.  Nelida Meuse III  CC: Dione Housekeeper, MD

## 2016-06-19 ENCOUNTER — Encounter (HOSPITAL_COMMUNITY)
Admission: RE | Admit: 2016-06-19 | Discharge: 2016-06-19 | Disposition: A | Discharge status: Home / Self Care | Payer: Medicare Other | Source: Ambulatory Visit | Attending: Neurosurgery | Admitting: Neurosurgery

## 2016-06-19 ENCOUNTER — Encounter (HOSPITAL_COMMUNITY): Payer: Self-pay

## 2016-06-19 DIAGNOSIS — Z794 Long term (current) use of insulin: Secondary | ICD-10-CM | POA: Diagnosis not present

## 2016-06-19 DIAGNOSIS — M4722 Other spondylosis with radiculopathy, cervical region: Secondary | ICD-10-CM | POA: Diagnosis not present

## 2016-06-19 DIAGNOSIS — I10 Essential (primary) hypertension: Secondary | ICD-10-CM | POA: Diagnosis not present

## 2016-06-19 DIAGNOSIS — R001 Bradycardia, unspecified: Secondary | ICD-10-CM | POA: Diagnosis not present

## 2016-06-19 DIAGNOSIS — G4733 Obstructive sleep apnea (adult) (pediatric): Secondary | ICD-10-CM | POA: Insufficient documentation

## 2016-06-19 DIAGNOSIS — Z01810 Encounter for preprocedural cardiovascular examination: Secondary | ICD-10-CM | POA: Insufficient documentation

## 2016-06-19 DIAGNOSIS — Z01818 Encounter for other preprocedural examination: Secondary | ICD-10-CM | POA: Insufficient documentation

## 2016-06-19 DIAGNOSIS — Z79899 Other long term (current) drug therapy: Secondary | ICD-10-CM | POA: Insufficient documentation

## 2016-06-19 DIAGNOSIS — I2510 Atherosclerotic heart disease of native coronary artery without angina pectoris: Secondary | ICD-10-CM | POA: Diagnosis not present

## 2016-06-19 DIAGNOSIS — M4712 Other spondylosis with myelopathy, cervical region: Secondary | ICD-10-CM | POA: Insufficient documentation

## 2016-06-19 DIAGNOSIS — E785 Hyperlipidemia, unspecified: Secondary | ICD-10-CM | POA: Diagnosis not present

## 2016-06-19 DIAGNOSIS — I447 Left bundle-branch block, unspecified: Secondary | ICD-10-CM | POA: Insufficient documentation

## 2016-06-19 HISTORY — DX: Cardiac murmur, unspecified: R01.1

## 2016-06-19 HISTORY — DX: Unspecified osteoarthritis, unspecified site: M19.90

## 2016-06-19 HISTORY — DX: Chronic kidney disease, unspecified: N18.9

## 2016-06-19 LAB — CBC
HCT: 38.3 % — ABNORMAL LOW (ref 39.0–52.0)
HEMOGLOBIN: 12.9 g/dL — AB (ref 13.0–17.0)
MCH: 29.9 pg (ref 26.0–34.0)
MCHC: 33.7 g/dL (ref 30.0–36.0)
MCV: 88.7 fL (ref 78.0–100.0)
Platelets: 161 10*3/uL (ref 150–400)
RBC: 4.32 MIL/uL (ref 4.22–5.81)
RDW: 13.3 % (ref 11.5–15.5)
WBC: 7.4 10*3/uL (ref 4.0–10.5)

## 2016-06-19 LAB — GLUCOSE, CAPILLARY: GLUCOSE-CAPILLARY: 122 mg/dL — AB (ref 65–99)

## 2016-06-19 LAB — TYPE AND SCREEN
ABO/RH(D): B POS
Antibody Screen: NEGATIVE

## 2016-06-19 LAB — BASIC METABOLIC PANEL
ANION GAP: 9 (ref 5–15)
BUN: 23 mg/dL — ABNORMAL HIGH (ref 6–20)
CALCIUM: 9.5 mg/dL (ref 8.9–10.3)
CHLORIDE: 103 mmol/L (ref 101–111)
CO2: 29 mmol/L (ref 22–32)
Creatinine, Ser: 1.57 mg/dL — ABNORMAL HIGH (ref 0.61–1.24)
GFR calc Af Amer: 49 mL/min — ABNORMAL LOW (ref >60)
GFR calc non Af Amer: 42 mL/min — ABNORMAL LOW (ref >60)
GLUCOSE: 131 mg/dL — AB (ref 65–99)
Potassium: 3.8 mmol/L (ref 3.5–5.1)
Sodium: 141 mmol/L (ref 135–145)

## 2016-06-19 LAB — ABO/RH: ABO/RH(D): B POS

## 2016-06-19 LAB — SURGICAL PCR SCREEN
MRSA, PCR: NEGATIVE
Staphylococcus aureus: POSITIVE — AB

## 2016-06-19 NOTE — Progress Notes (Addendum)
PCP:Dr. Nyland  Cardiologist: Dr. Aundra Dubin last visit 2012  Fasting sugars:100-130  Pt. Stated he  Instructed last dose of plavix and aspirin was 06/18/16.  Pt. Reported he fell 06/13/16 and caught himself on counter, hit on right side of chest and is still sore but improving.  No increase of sob noted. Langley Adie, PA notified.

## 2016-06-19 NOTE — Pre-Procedure Instructions (Addendum)
Jeffery Fernandez  06/19/2016      [id number] [place], [place] - [address] [place] [address] Phone: [phone] Fax: [phone]    Your procedure is scheduled on Wed. May 30  Report to [facility] Admitting at 5:30 A.M.  Call this number if you have problems the morning of surgery:  [phone]   Remember:  Do not eat food or drink liquids after midnight on Tues. May 29   Take these medicines the morning of surgery with A SIP OF WATER : tylenol if needed, eye drops if needed, metoprolol, protonix, zoloft(sertraline)                         1 week prior to surgery stop: advil, motrin, aleve, ibuprofen, vitamins/herbal medicines                STOP ASPIRIN AND PLAVIX PER DR. Arnoldo Morale     How to Manage Your Diabetes Before and After Surgery  Why is it important to control my blood sugar before and after surgery? . Improving blood sugar levels before and after surgery helps healing and can limit problems. . A way of improving blood sugar control is eating a healthy diet by: o  Eating less sugar and carbohydrates o  Increasing activity/exercise o  Talking with your doctor about reaching your blood sugar goals . High blood sugars (greater than 180 mg/dL) can raise your risk of infections and slow your recovery, so you will need to focus on controlling your diabetes during the weeks before surgery. . Make sure that the doctor who takes care of your diabetes knows about your planned surgery including the date and location.  How do I manage my blood sugar before surgery? . Check your blood sugar at least 4 times a day, starting 2 days before surgery, to make sure that the level is not too high or low. o Check your blood sugar the morning of your surgery when you wake up and every 2 hours until you get to the Short Stay unit. . If your blood sugar is less than 70 mg/dL, you will need to treat for low  blood sugar: o Do not take insulin. o Treat a low blood sugar (less than 70 mg/dL) with  cup of clear juice (cranberry or apple), 4 glucose tablets, OR glucose gel. o Recheck blood sugar in 15 minutes after treatment (to make sure it is greater than 70 mg/dL). If your blood sugar is not greater than 70 mg/dL on recheck, call [phone] for further instructions. . Report your blood sugar to the short stay nurse when you get to Short Stay.  . If you are admitted to the hospital after surgery: o Your blood sugar will be checked by the staff and you will probably be given insulin after surgery (instead of oral diabetes medicines) to make sure you have good blood sugar levels. o The goal for blood sugar control after surgery is 80-180 mg/dL.       WHAT DO I DO ABOUT MY DIABETES MEDICATION?   Marland Kitchen Do not take oral diabetes medicines (pills) the morning of surgery. The day before surgery  Take glipizide (glucotrol)  Only morning     Do not wear jewelry  Do not wear lotions, powders, or perfumes, or deoderant.  Do not shave 48 hours prior to surgery.  Men may shave face  and neck.  Do not bring valuables to the hospital.  [facility] is not responsible for any belongings or valuables.  Contacts, dentures or bridgework may not be worn into surgery.  Leave your suitcase in the car.  After surgery it may be brought to your room.  For patients admitted to the hospital, discharge time will be determined by your treatment team.  Patients discharged the day of surgery will not be allowed to drive home.   Special instructions:  - Preparing For Surgery  Before surgery, you can play an important role. Because skin is not sterile, your skin needs to be as free of germs as possible. You can reduce the number of germs on your skin by washing with CHG (chlorahexidine gluconate) Soap before surgery.  CHG is an antiseptic cleaner which kills germs and bonds with the skin to continue killing  germs even after washing.  Please do not use if you have an allergy to CHG or antibacterial soaps. If your skin becomes reddened/irritated stop using the CHG.  Do not shave (including legs and underarms) for at least 48 hours prior to first CHG shower. It is OK to shave your face.  Please follow these instructions carefully.   1. Shower the NIGHT BEFORE SURGERY and the MORNING OF SURGERY with CHG.   2. If you chose to wash your hair, wash your hair first as usual with your normal shampoo.  3. After you shampoo, rinse your hair and body thoroughly to remove the shampoo.  4. Use CHG as you would any other liquid soap. You can apply CHG directly to the skin and wash gently with a scrungie or a clean washcloth.   5. Apply the CHG Soap to your body ONLY FROM THE NECK DOWN.  Do not use on open wounds or open sores. Avoid contact with your eyes, ears, mouth and genitals (private parts). Wash genitals (private parts) with your normal soap.  6. Wash thoroughly, paying special attention to the area where your surgery will be performed.  7. Thoroughly rinse your body with warm water from the neck down.  8. DO NOT shower/wash with your normal soap after using and rinsing off the CHG Soap.  9. Pat yourself dry with a CLEAN TOWEL.   10. Wear CLEAN PAJAMAS   11. Place CLEAN SHEETS on your bed the night of your first shower and DO NOT SLEEP WITH PETS.    Day of Surgery: Do not apply any deodorants/lotions. Please wear clean clothes to the hospital/surgery center.      Please read over the following fact sheets that you were given. Coughing and Deep Breathing, MRSA Information and Surgical Site Infection Prevention

## 2016-06-19 NOTE — Progress Notes (Addendum)
Anesthesia PAT Evaluation: Patient is a 71 year old male scheduled for C3-4, C4-5, C5-6 ACDF on 06/26/16 by Dr. Arnoldo Morale.  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 Zadie Rhine) '12, cataract extraction, nephrolithiasis s/p surgery.   - PCP is Dr. Britta Mccreedy with [facility]. - Cardiologist is Dr. Loralie Champagne, last visit 07/10/10. 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 By notes, there was no afib on 21 day Holter monitor in 2011.  - Pulmonologist is Dr. Baird Lyons (for OSA).  Meds include aspirin 81 mg, Lipitor, Plavix (last dose 06/18/16), glipizide, losartan-HCTZ, metformin, metoprolol, Protonix, Zoloft, Januvia, terazosin.  BP (!) 159/83   Pulse 61   Temp 36.4 C (Oral)   Resp 20   Ht '5\' 10"'  (1.778 m)   Wt 211 lb 5 oz (95.9 kg)   SpO2 91%   BMI 30.32 kg/m  SpO2 recheck 95%. Heart RRR, no murmur noted. Lungs sounds are clear. No pre-tibial edema noted. Mouth opening > 3FB. He does have a few missing teeth. Patient is a very pleasant Caucasian male in NAD. Wife is at side. They help care for their great grandchildren who are 68 month old twins. He denied chest pain. Edema resolved with HCTZ. Occasional heart "flutters" (lasting up to 60 seconds). No syncope. No SOB at rest. He has stable DOE with moderate activity. He reports he was able to walk from the parking deck to PAT without stopping and without significant dyspnea. He does some woodwork Careers information officer) and likes to garden, but has not done these in the very recent past (due to keeping twins and neck symptoms). He has had progressive  weakness in his RUE and now RLE, neck pain, and severe headaches which have been attributes to his c-spine disease.  EKG 06/19/16: SB at 59 bpm, LAD, left BBB (old).   Echo 06/20/10: Impressions: - Normal LV size with mild LV hypertrophy. EF 55% with septal bounce consistent with LBBB. Normal RV size and systolic function. No significant valvular abnormalities.  Cardiac cath 06/11/10:FINDINGS: 1. Hemodynamics.  Mean right atrial pressure 5 mmHg, RV 34/8, PA 34/12     with mean PA pressure 22 mmHg.  Mean pulmonary capillary wedge     pressure 4 mmHg, LV 160/19, aorta 151/72.  Aortic saturation 92%,     PA saturation 61%.  This gives [place] a cardiac output of 4 and a     cardiac index of 1.9.  However actually showed that the PA sat     completely accurate as the low cardiac output does not fit well     with the rest of the numbers from this case. 2. Left ventriculography.  EF was estimated to be 55%.  There were no     regional wall motion abnormalities. 3. Right coronary artery.  The right coronary artery was a dominant     vessel.  There is a 60% mid RCA stenosis followed shortly     thereafter by second 60-70% mid RCA stenosis. 4. Left main.  The left main had no angiographic coronary artery     disease. 5. LAD system.  The LAD system had luminal irregularities, otherwise  no significant disease. 6. Left circumflex system.  The left circumflex system had mild     luminal irregularities only. 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  I really do not think that this explains the patient's quite significant dyspnea resulting in hospitalization in April.  I suspect that chronic obstructive pulmonary disease may end up being the main cause of his shortness of breath.  His right and left heart filling pressures were not elevated.  His LV systolic function is normal.  There is minimal disease in the circumflex and in the LAD.   I think continuing aggressive medical treatment for coronary artery disease is appropriate and he will, as planned, get PFTs and a sleep study.  Carotid U/S 04/11/16: Impression: Less than 40% bilateral ICA stenosis.  06/20/10 Spirometry: Spirometry is within normal limits. Lung volumes are within normal limits. Diffusion capacity is within normal limits. This is interpreted as an insignificant response to bronchodilator.   Preoperative labs noted. BUN 29, Cr 1.57, eGFR 42 (previously Cr ~ 1.35-1.50 and eGFR ~ 46-53 when compared to labs in [place] since 12/2014). A1c in process (A1c 7.5 on 03/26/16). He reports home CBGs ~ 100's-130's.   Patient with 60-70% RCA stenosis by 2012 cath, chronic left BBB, and DM2 who has not seen cardiology in > 5 years. His O2 sat was initially recorded at 91%, but was 95% once rechecked. He is a former smoker, but spirometry WNL in 2012. He denied any new cardiopulmonary symptoms. He is compliant with CPAP. Discussed cardiac history with anesthesiologist Dr. Therisa Doyne. Recommend preoperative cardiology evaluation. Message left with Nicki at Dr. Arnoldo Morale' office.   George Hugh [facility] Short Stay Center/Anesthesiology Phone [phone] 06/19/2016 11:46 AM  Addendum: Patient was seen by cardiologist Dr. Skeet Latch on 06/20/16 for preoperative evaluation. BP was 168/90. She changed him from metoprolol tartrate 50 mg BID to carvedilol 25 mg BID. She wrote, "Mr. Paxton does not have any unstable cardiac conditions.  Upon evaluation today, he can achieve 4 METs or greater without anginal symptoms.  According to [facility] and AHA guidelines, he requires no further cardiac workup prior to his noncardiac surgery and should be at acceptable risk." She has asked him in the long term to follow-up with neurology to clarify if he should continue both ASA and Plavix--for now both are on hold for surgery. He has scheduled neurology follow-up with Dr. Jaynee Eagles next  month. If no acute changes then I anticipate that he can proceed as planned.  George Hugh [facility] Short Stay Center/Anesthesiology Phone [phone] 06/21/2016 12:55 PM

## 2016-06-20 ENCOUNTER — Encounter: Payer: Self-pay | Admitting: Cardiovascular Disease

## 2016-06-20 ENCOUNTER — Ambulatory Visit (INDEPENDENT_AMBULATORY_CARE_PROVIDER_SITE_OTHER): Payer: Medicare Other | Admitting: Cardiovascular Disease

## 2016-06-20 VITALS — BP 168/90 | HR 67 | Ht 70.0 in | Wt 211.6 lb

## 2016-06-20 DIAGNOSIS — Z01818 Encounter for other preprocedural examination: Secondary | ICD-10-CM | POA: Diagnosis not present

## 2016-06-20 DIAGNOSIS — I2510 Atherosclerotic heart disease of native coronary artery without angina pectoris: Secondary | ICD-10-CM | POA: Diagnosis not present

## 2016-06-20 DIAGNOSIS — I10 Essential (primary) hypertension: Secondary | ICD-10-CM

## 2016-06-20 DIAGNOSIS — E7800 Pure hypercholesterolemia, unspecified: Secondary | ICD-10-CM

## 2016-06-20 LAB — HEMOGLOBIN A1C
HEMOGLOBIN A1C: 7.5 % — AB (ref 4.8–5.6)
MEAN PLASMA GLUCOSE: 169 mg/dL

## 2016-06-20 MED ORDER — CARVEDILOL 25 MG PO TABS: 25.0000 mg | ORAL_TABLET | Freq: Two times a day (BID) | ORAL | 5 refills | Status: DC

## 2016-06-20 NOTE — Progress Notes (Signed)
Cardiology Office Note   Date:  06/21/2016   ID:  Greene, Diodato [birth year], MRN [id number]  PCP:  Dione Housekeeper, MD  Cardiologist:   Skeet Latch, MD   Chief Complaint  Patient presents with  . New Evaluation    pt having neck surgery next wednesday to replace disk C4 C5 and C6 that's affecting his right side  . Medical Clearance      History of Present Illness: Jeffery Fernandez is a 71 y.o. male medically managed CAD, hypertension, hyperlipidemia, diabetes, COPD, LBBB, and retinal artery occlusion who presents for pre-surgical risk assessment.  Jeffery Fernandez is scheduled to have cervical spine surgery next Wednesday.  He presented for his anesthesia pre-op evaluation and it was noted that he had a CAD history and had not seen a cardiologist recently.  He was referred for evaluation.  Jeffery Fernandez has been feeling well.  His only complaint is right arm and shoulder pain.  This occurs when turning his head.  He notes loss of strength in his right arm and leg.  He denies chest pain or shortness of breath.  He doesn't get any formal exercise but helps care for his three month old twin grandchildren.  He also does a lot of gardening and yardwork.  He denies chet pain or shortness of breath with this activity.  He sometimes has lower extremity edema after standing for long periods of time.  This improves with elevation of his legs and with the use of diabetic socks.  He sometimes checks his blood pressure at home and it typically runs in the 150s/80s.    Jeffery Fernandez was previously a patient of Dr. Aundra Dubin.  He was last seen in 2012.  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  He had an echo and exercise Cardiolite that were unremarkable.  He also wore a 21 day event monitor for three weeks and no atrial fibrillation was noted.  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  In retrospect he  thinks that this is what was wrong with his right eye.  Jeffery Fernandez was seen  Of shortness of breath in 2012 and was thought to have a COPD exacerbation.  However he didn't have a history of COPD.  He was referred for cardiac catheterization 05/2010, at which time he was noted to have normal right and left heart filling pressures.  He had 50-60 mid RCA stenosis.  Echo revealed LVEF 55% and was otherwise unremarkable.    Past Medical History:  Diagnosis Date  . Arrhythmia   . Arthritis   . Chest pain, unspecified   . Chronic airway obstruction, not elsewhere classified    PT. DENIES HE HAS COPD  . CKD (chronic kidney disease)   . Colon polyps    Adenomatous Polyps 2007  . Depression   . Diabetes mellitus   . Diverticulosis   . Gout   . Heart murmur   . Hyperlipemia   . Hypertension   . Nephrolithiasis   . Obstructive sleep apnea (adult) (pediatric)   . Other and unspecified hyperlipidemia   . Other left bundle branch block   . Retinal vascular occlusion, unspecified    right  . Shortness of breath    on exertion  . Unspecified sleep apnea   . Vision loss of right eye     Past  Surgical History:  Procedure Laterality Date  . CATARACT EXTRACTION Bilateral   . KIDNEY STONE SURGERY       Current Outpatient Prescriptions  Medication Sig Dispense Refill  . acetaminophen (TYLENOL) 500 MG tablet Take 1,000 mg by mouth every 6 (six) hours as needed (for pain.).    Marland Kitchen aspirin EC 81 MG tablet Take 81 mg by mouth daily.    Marland Kitchen atorvastatin (LIPITOR) 40 MG tablet Take 20 mg by mouth every evening.    . clopidogrel (PLAVIX) 75 MG tablet Take 75 mg by mouth daily.      Marland Kitchen glipiZIDE (GLUCOTROL XL) 10 MG 24 hr tablet Take 10 mg by mouth 2 (two) times daily.    Marland Kitchen Ketotifen Fumarate (ITCHY EYE DROPS OP) Place 1 drop into both eyes daily as needed (for itchy eyes.).    Marland Kitchen losartan-hydrochlorothiazide (HYZAAR) 100-25 MG tablet Take 1 tablet by mouth daily.     . metFORMIN (GLUCOPHAGE) 500 MG tablet  Take 500 mg by mouth 2 (two) times daily with a meal.      . pantoprazole (PROTONIX) 40 MG tablet Take by mouth.    . sertraline (ZOLOFT) 50 MG tablet Take 50 mg by mouth daily.      . sitaGLIPtan (JANUVIA) 100 MG tablet Take 100 mg by mouth daily.      Marland Kitchen terazosin (HYTRIN) 2 MG capsule Take 2 mg by mouth at bedtime.      . carvedilol (COREG) 25 MG tablet Take 1 tablet (25 mg total) by mouth 2 (two) times daily with a meal. 60 tablet 5   No current facility-administered medications for this visit.     Allergies:   Penicillins    Social History:  The patient  reports that he quit smoking about 30 years ago. His smoking use included Cigarettes. He has a 37.50 pack-year smoking history. He has quit using smokeless tobacco. He reports that he does not drink alcohol or use drugs.   Family History:  The patient's family history includes Arthritis/Rheumatoid in his cousin; Atrial fibrillation in his brother; Diabetes in his other; Heart attack (age of onset: 31) in his brother; Heart disease in his father; Parkinson's disease in his brother; Stroke in his father; Uterine cancer in his mother.    ROS:  Please see the history of present illness.   Otherwise, review of systems are positive for none.   All other systems are reviewed and negative.    PHYSICAL EXAM: VS:  BP (!) 168/90 (BP Location: Left Arm, Patient Position: Sitting, Cuff Size: Small)   Pulse 67   Ht 5\' 10"  (1.778 m)   Wt 96 kg (211 lb 9.6 oz)   BMI 30.36 kg/m  , BMI Body mass index is 30.36 kg/m. GENERAL:  Well appearing HEENT:  Pupils equal round and reactive, fundi not visualized, oral mucosa unremarkable NECK:  No jugular venous distention, waveform within normal limits, carotid upstroke brisk and symmetric, no bruits, no thyromegaly LYMPHATICS:  No cervical adenopathy LUNGS:  Clear to auscultation bilaterally HEART:  RRR.  PMI not displaced or sustained,S1 and S2 within normal limits, no S3, no S4, no clicks, no rubs, no  murmurs ABD:  Flat, positive bowel sounds normal in frequency in pitch, no bruits, no rebound, no guarding, no midline pulsatile mass, no hepatomegaly, no splenomegaly EXT:  2 plus pulses throughout, no edema, no cyanosis no clubbing SKIN:  No rashes no nodules NEURO:  Cranial nerves II through XII grossly intact, motor grossly intact  throughout [facility]:  Cognitively intact, oriented to person place and time   EKG:  EKG is not ordered today. The ekg ordered 06/19/16 demonstrates sinus bradycardia.  LBBB.  LAD.     Recent Labs: 06/19/2016: BUN 23; Creatinine, Ser 1.57; Hemoglobin 12.9; Platelets 161; Potassium 3.8; Sodium 141    Lipid Panel    Component Value Date/Time   CHOL 127 06/08/2010 1110   TRIG 122.0 06/08/2010 1110   HDL 26.80 (L) 06/08/2010 1110   CHOLHDL 5 06/08/2010 1110   VLDL 24.4 06/08/2010 1110   LDLCALC 76 06/08/2010 1110      Wt Readings from Last 3 Encounters:  06/20/16 96 kg (211 lb 9.6 oz)  06/19/16 95.9 kg (211 lb 5 oz)  06/04/16 96.8 kg (213 lb 8 oz)      ASSESSMENT AND PLAN:  # Pre-surgical risk assessment: Mr. Piper does not have any unstable cardiac conditions.  Upon evaluation today, he can achieve 4 METs or greater without anginal symptoms.  According to [facility] and AHA guidelines, he requires no further cardiac workup prior to his noncardiac surgery and should be at acceptable risk.  his NSQIP risk of peri-procedural MI or cardiac arrest is 0.19%.  Our service is available as necessary in the perioperative period.  # CAD: Mr. Schroth has non-obstructive CAD that is medically managed.  # Hypertension: Blood pressure is poorly-controlled both here and at home.  We will switch metoprolol to carvedilol 25mg  bid.  Continue losartan and HCTZ.  # Prior stroke: Mr. Schueller was diagnosed with a stroke.  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  No stroke was seen on MRI of the brain, though he did have chronic microvascular changes.  He will  follow up with Dr. Jaynee Eagles to determine whether he still needs to be on both aspirin and clopidogrel.  # Hyperlipidemia: Continue atorvastatin.  Current medicines are reviewed at length with the patient today.  The patient does not have concerns regarding medicines.  The following changes have been made:  Stop metoprolol and start carvedilol 25mg    Labs/ tests ordered today include:  No orders of the defined types were placed in this encounter.    Disposition:   FU with Koby Hartfield C. Oval Linsey, MD, [facility] in 1 month.    This note was written with the assistance of speech recognition software.  Please excuse any transcriptional errors.  Signed, Marveen Donlon C. Oval Linsey, MD, [facility]  06/21/2016 8:22 PM    [facility]

## 2016-06-20 NOTE — Patient Instructions (Signed)
Your physician has recommended you make the following change in your medication:  -- STOP metoprolol  -- START carvedilol 25mg  twice daily  Your physician recommends that you schedule a follow-up appointment in [place] for a blood pressure check appointment with either Dr. Oval Linsey or clinical pharmacist  Your physician wants you to follow-up in: ONE YEAR with Dr. Oval Linsey. You will receive a reminder letter in the mail two months in advance. If you don't receive a letter, please call our office to schedule the follow-up appointment.   Dr. Oval Linsey has cleared you for surgery

## 2016-06-21 ENCOUNTER — Telehealth: Payer: Self-pay | Admitting: Cardiovascular Disease

## 2016-06-21 NOTE — Telephone Encounter (Signed)
New Message    Can you fax over the office notes and cardiac clearance from yesterdays office visit

## 2016-06-21 NOTE — Telephone Encounter (Signed)
Clearance letter & office note routed via EPIC to Dr. Angela Nevin @ [facility]

## 2016-06-22 ENCOUNTER — Telehealth: Payer: Self-pay | Admitting: Internal Medicine

## 2016-06-22 NOTE — Telephone Encounter (Signed)
Patient's wife called stating his blood pressure was elevated and she was not sure what to do about that.  Pressures at home were 176/97 and 187/104.  She stated he was asymptomatic and denied headache, lightheadedness, dizziness, chest pain or dyspnea.  No focal neurologic deficits.    He is currently at a max dose of coreg as well as hyzaar.  I suspect he is going to need a third agent for better control.  I instructed her that should he have any of the above symptoms he should present to the nearest emergency department.  Otherwise she is to call the clinic when they open on Tuesday to discuss how to proceed.  Baruch Merl, MD, PhD Cardiology

## 2016-06-25 ENCOUNTER — Telehealth: Payer: Self-pay | Admitting: Cardiovascular Disease

## 2016-06-25 NOTE — Telephone Encounter (Signed)
Last office note containing clearance faxed to the number provided.

## 2016-06-25 NOTE — Telephone Encounter (Signed)
New message       Request for surgical clearance:  What type of surgery is being performed? Fusion   C3-C6 When is this surgery scheduled?  06-26-16 Are there any medications that need to be held prior to surgery and how long? Need cardiac clearance----pt was seen last week 1. Name of physician performing surgery?  Dr Benay Pillow  What is your office phone and fax number?  Fax [phone]

## 2016-06-26 ENCOUNTER — Inpatient Hospital Stay (HOSPITAL_COMMUNITY): Payer: Medicare Other | Admitting: Anesthesiology

## 2016-06-26 ENCOUNTER — Inpatient Hospital Stay (HOSPITAL_COMMUNITY): Payer: Medicare Other | Admitting: Critical Care Medicine

## 2016-06-26 ENCOUNTER — Inpatient Hospital Stay (HOSPITAL_COMMUNITY): Payer: Medicare Other | Admitting: Vascular Surgery

## 2016-06-26 ENCOUNTER — Encounter (HOSPITAL_COMMUNITY): Payer: Self-pay | Admitting: Critical Care Medicine

## 2016-06-26 ENCOUNTER — Inpatient Hospital Stay (HOSPITAL_COMMUNITY)
Admission: RE | Admit: 2016-06-26 | Discharge: 2016-06-28 | DRG: 472 | Disposition: A | Discharge status: Home / Self Care | Payer: Medicare Other | Source: Ambulatory Visit | Attending: Neurosurgery | Admitting: Neurosurgery

## 2016-06-26 ENCOUNTER — Inpatient Hospital Stay (HOSPITAL_COMMUNITY): Payer: Medicare Other

## 2016-06-26 ENCOUNTER — Encounter (HOSPITAL_COMMUNITY)
Admission: RE | Disposition: A | Discharge status: Home / Self Care | Payer: Self-pay | Source: Ambulatory Visit | Attending: Neurosurgery

## 2016-06-26 ENCOUNTER — Inpatient Hospital Stay (HOSPITAL_COMMUNITY)
Admission: RE | Disposition: A | Discharge status: Home / Self Care | Payer: Self-pay | Source: Ambulatory Visit | Attending: Neurosurgery

## 2016-06-26 DIAGNOSIS — Z9989 Dependence on other enabling machines and devices: Secondary | ICD-10-CM | POA: Diagnosis not present

## 2016-06-26 DIAGNOSIS — T380X5A Adverse effect of glucocorticoids and synthetic analogues, initial encounter: Secondary | ICD-10-CM | POA: Diagnosis not present

## 2016-06-26 DIAGNOSIS — E1122 Type 2 diabetes mellitus with diabetic chronic kidney disease: Secondary | ICD-10-CM | POA: Diagnosis not present

## 2016-06-26 DIAGNOSIS — M1990 Unspecified osteoarthritis, unspecified site: Secondary | ICD-10-CM | POA: Diagnosis present

## 2016-06-26 DIAGNOSIS — Z7984 Long term (current) use of oral hypoglycemic drugs: Secondary | ICD-10-CM

## 2016-06-26 DIAGNOSIS — Z87891 Personal history of nicotine dependence: Secondary | ICD-10-CM

## 2016-06-26 DIAGNOSIS — J449 Chronic obstructive pulmonary disease, unspecified: Secondary | ICD-10-CM | POA: Diagnosis not present

## 2016-06-26 DIAGNOSIS — D649 Anemia, unspecified: Secondary | ICD-10-CM | POA: Diagnosis not present

## 2016-06-26 DIAGNOSIS — H5461 Unqualified visual loss, right eye, normal vision left eye: Secondary | ICD-10-CM | POA: Diagnosis present

## 2016-06-26 DIAGNOSIS — G9782 Other postprocedural complications and disorders of nervous system: Secondary | ICD-10-CM | POA: Diagnosis not present

## 2016-06-26 DIAGNOSIS — Y838 Other surgical procedures as the cause of abnormal reaction of the patient, or of later complication, without mention of misadventure at the time of the procedure: Secondary | ICD-10-CM | POA: Diagnosis not present

## 2016-06-26 DIAGNOSIS — M4802 Spinal stenosis, cervical region: Secondary | ICD-10-CM | POA: Diagnosis not present

## 2016-06-26 DIAGNOSIS — I2510 Atherosclerotic heart disease of native coronary artery without angina pectoris: Secondary | ICD-10-CM | POA: Diagnosis present

## 2016-06-26 DIAGNOSIS — E785 Hyperlipidemia, unspecified: Secondary | ICD-10-CM | POA: Diagnosis present

## 2016-06-26 DIAGNOSIS — G4733 Obstructive sleep apnea (adult) (pediatric): Secondary | ICD-10-CM | POA: Diagnosis present

## 2016-06-26 DIAGNOSIS — Z7902 Long term (current) use of antithrombotics/antiplatelets: Secondary | ICD-10-CM

## 2016-06-26 DIAGNOSIS — I129 Hypertensive chronic kidney disease with stage 1 through stage 4 chronic kidney disease, or unspecified chronic kidney disease: Secondary | ICD-10-CM | POA: Diagnosis present

## 2016-06-26 DIAGNOSIS — E1165 Type 2 diabetes mellitus with hyperglycemia: Secondary | ICD-10-CM | POA: Diagnosis not present

## 2016-06-26 DIAGNOSIS — M4722 Other spondylosis with radiculopathy, cervical region: Secondary | ICD-10-CM | POA: Diagnosis present

## 2016-06-26 DIAGNOSIS — Z8679 Personal history of other diseases of the circulatory system: Secondary | ICD-10-CM

## 2016-06-26 DIAGNOSIS — Y92230 Patient room in hospital as the place of occurrence of the external cause: Secondary | ICD-10-CM | POA: Diagnosis not present

## 2016-06-26 DIAGNOSIS — Z880 Allergy status to penicillin: Secondary | ICD-10-CM

## 2016-06-26 DIAGNOSIS — M5010 Cervical disc disorder with radiculopathy, unspecified cervical region: Principal | ICD-10-CM | POA: Diagnosis present

## 2016-06-26 DIAGNOSIS — Z419 Encounter for procedure for purposes other than remedying health state, unspecified: Secondary | ICD-10-CM

## 2016-06-26 DIAGNOSIS — R1310 Dysphagia, unspecified: Secondary | ICD-10-CM | POA: Diagnosis not present

## 2016-06-26 DIAGNOSIS — F329 Major depressive disorder, single episode, unspecified: Secondary | ICD-10-CM | POA: Diagnosis present

## 2016-06-26 DIAGNOSIS — Z79899 Other long term (current) drug therapy: Secondary | ICD-10-CM

## 2016-06-26 DIAGNOSIS — M96830 Postprocedural hemorrhage and hematoma of a musculoskeletal structure following a musculoskeletal system procedure: Secondary | ICD-10-CM | POA: Diagnosis not present

## 2016-06-26 DIAGNOSIS — M4712 Other spondylosis with myelopathy, cervical region: Secondary | ICD-10-CM | POA: Diagnosis not present

## 2016-06-26 DIAGNOSIS — N189 Chronic kidney disease, unspecified: Secondary | ICD-10-CM | POA: Diagnosis present

## 2016-06-26 DIAGNOSIS — G960 Cerebrospinal fluid leak: Secondary | ICD-10-CM | POA: Diagnosis not present

## 2016-06-26 DIAGNOSIS — Z7982 Long term (current) use of aspirin: Secondary | ICD-10-CM

## 2016-06-26 HISTORY — PX: ANTERIOR CERVICAL DECOMP/DISCECTOMY FUSION: SHX1161

## 2016-06-26 LAB — GLUCOSE, CAPILLARY
GLUCOSE-CAPILLARY: 310 mg/dL — AB (ref 65–99)
GLUCOSE-CAPILLARY: 321 mg/dL — AB (ref 65–99)
GLUCOSE-CAPILLARY: 301 mg/dL — AB (ref 65–99)
Glucose-Capillary: 145 mg/dL — ABNORMAL HIGH (ref 65–99)
Glucose-Capillary: 182 mg/dL — ABNORMAL HIGH (ref 65–99)
Glucose-Capillary: 203 mg/dL — ABNORMAL HIGH (ref 65–99)
Glucose-Capillary: 327 mg/dL — ABNORMAL HIGH (ref 65–99)

## 2016-06-26 SURGERY — ANTERIOR CERVICAL DECOMPRESSION/DISCECTOMY FUSION 3 LEVELS
Anesthesia: General

## 2016-06-26 SURGERY — ANTERIOR CERVICAL DECOMPRESSION/DISCECTOMY FUSION 1 LEVEL/HARDWARE REMOVAL
Anesthesia: General | Site: Spine Cervical

## 2016-06-26 MED ORDER — THROMBIN 20000 UNITS EX SOLR
CUTANEOUS | Status: DC | PRN
  Administered 2016-06-26: 07:00:00 via TOPICAL

## 2016-06-26 MED ORDER — ROCURONIUM BROMIDE 10 MG/ML (PF) SYRINGE
PREFILLED_SYRINGE | INTRAVENOUS | Status: AC
  Filled 2016-06-26: qty 5

## 2016-06-26 MED ORDER — VANCOMYCIN HCL 1000 MG IV SOLR
INTRAVENOUS | Status: DC | PRN
  Administered 2016-06-26: 1000 mg via INTRAVENOUS

## 2016-06-26 MED ORDER — FENTANYL CITRATE (PF) 250 MCG/5ML IJ SOLN
INTRAMUSCULAR | Status: DC | PRN
  Administered 2016-06-26: 150 ug via INTRAVENOUS

## 2016-06-26 MED ORDER — HEMOSTATIC AGENTS (NO CHARGE) OPTIME
TOPICAL | Status: DC | PRN
  Administered 2016-06-26: 1 via TOPICAL

## 2016-06-26 MED ORDER — PROPOFOL 10 MG/ML IV BOLUS
INTRAVENOUS | Status: DC | PRN
  Administered 2016-06-26: 200 mg via INTRAVENOUS

## 2016-06-26 MED ORDER — ONDANSETRON HCL 4 MG/2ML IJ SOLN
INTRAMUSCULAR | Status: AC
  Filled 2016-06-26: qty 2

## 2016-06-26 MED ORDER — MIDAZOLAM HCL 2 MG/2ML IJ SOLN
INTRAMUSCULAR | Status: AC
  Filled 2016-06-26: qty 2

## 2016-06-26 MED ORDER — THROMBIN 20000 UNITS EX SOLR
CUTANEOUS | Status: AC
  Filled 2016-06-26: qty 20000

## 2016-06-26 MED ORDER — 0.9 % SODIUM CHLORIDE (POUR BTL) OPTIME
TOPICAL | Status: DC | PRN
  Administered 2016-06-26: 1000 mL

## 2016-06-26 MED ORDER — SERTRALINE HCL 50 MG PO TABS
50.0000 mg | ORAL_TABLET | Freq: Every day | ORAL | Status: DC
  Administered 2016-06-27: 50 mg via ORAL
  Filled 2016-06-26: qty 1

## 2016-06-26 MED ORDER — PHENOL 1.4 % MT LIQD: 1.0000 | OROMUCOSAL | Status: DC | PRN

## 2016-06-26 MED ORDER — MENTHOL 3 MG MT LOZG: 1.0000 | LOZENGE | OROMUCOSAL | Status: DC | PRN

## 2016-06-26 MED ORDER — PHENYLEPHRINE HCL 10 MG/ML IJ SOLN
INTRAMUSCULAR | Status: AC
  Filled 2016-06-26: qty 1

## 2016-06-26 MED ORDER — OXYCODONE HCL 5 MG PO TABS
5.0000 mg | ORAL_TABLET | ORAL | Status: DC | PRN
  Administered 2016-06-26 – 2016-06-28 (×4): 10 mg via ORAL
  Filled 2016-06-26 (×4): qty 2

## 2016-06-26 MED ORDER — MORPHINE SULFATE (PF) 4 MG/ML IV SOLN: 4.0000 mg | INTRAVENOUS | Status: DC | PRN

## 2016-06-26 MED ORDER — PHENYLEPHRINE 40 MCG/ML (10ML) SYRINGE FOR IV PUSH (FOR BLOOD PRESSURE SUPPORT)
PREFILLED_SYRINGE | INTRAVENOUS | Status: DC | PRN
  Administered 2016-06-26 (×6): 40 ug via INTRAVENOUS

## 2016-06-26 MED ORDER — BUPIVACAINE-EPINEPHRINE (PF) 0.5% -1:200000 IJ SOLN
INTRAMUSCULAR | Status: AC
  Filled 2016-06-26: qty 30

## 2016-06-26 MED ORDER — FENTANYL CITRATE (PF) 250 MCG/5ML IJ SOLN
INTRAMUSCULAR | Status: AC
  Filled 2016-06-26: qty 5

## 2016-06-26 MED ORDER — ROCURONIUM 10MG/ML (10ML) SYRINGE FOR MEDFUSION PUMP - OPTIME
INTRAVENOUS | Status: DC | PRN
Start: 2016-06-26 — End: 2016-06-26
  Administered 2016-06-26: 40 mg via INTRAVENOUS

## 2016-06-26 MED ORDER — FENTANYL CITRATE (PF) 100 MCG/2ML IJ SOLN: 25.0000 ug | INTRAMUSCULAR | Status: DC | PRN

## 2016-06-26 MED ORDER — ACETAMINOPHEN 325 MG PO TABS: 650.0000 mg | ORAL_TABLET | ORAL | Status: DC | PRN

## 2016-06-26 MED ORDER — ALUM & MAG HYDROXIDE-SIMETH 200-200-20 MG/5ML PO SUSP: 30.0000 mL | Freq: Four times a day (QID) | ORAL | Status: DC | PRN

## 2016-06-26 MED ORDER — DEXAMETHASONE SODIUM PHOSPHATE 10 MG/ML IJ SOLN
INTRAMUSCULAR | Status: DC | PRN
  Administered 2016-06-26: 6 mg via INTRAVENOUS

## 2016-06-26 MED ORDER — ACETAMINOPHEN 650 MG RE SUPP: 650.0000 mg | RECTAL | Status: DC | PRN

## 2016-06-26 MED ORDER — BISACODYL 10 MG RE SUPP: 10.0000 mg | Freq: Every day | RECTAL | Status: DC | PRN

## 2016-06-26 MED ORDER — FENTANYL CITRATE (PF) 250 MCG/5ML IJ SOLN
INTRAMUSCULAR | Status: AC
Start: 2016-06-26 — End: 2016-06-26
  Filled 2016-06-26: qty 5

## 2016-06-26 MED ORDER — SUGAMMADEX SODIUM 200 MG/2ML IV SOLN
INTRAVENOUS | Status: AC
  Filled 2016-06-26: qty 2

## 2016-06-26 MED ORDER — THROMBIN 5000 UNITS EX SOLR
OROMUCOSAL | Status: DC | PRN
  Administered 2016-06-26 (×2): via TOPICAL

## 2016-06-26 MED ORDER — THROMBIN 5000 UNITS EX SOLR
CUTANEOUS | Status: AC
  Filled 2016-06-26: qty 15000

## 2016-06-26 MED ORDER — LIDOCAINE HCL (CARDIAC) 20 MG/ML IV SOLN
INTRAVENOUS | Status: DC | PRN
  Administered 2016-06-26: 100 mg via INTRATRACHEAL

## 2016-06-26 MED ORDER — LIDOCAINE 2% (20 MG/ML) 5 ML SYRINGE
INTRAMUSCULAR | Status: AC
  Filled 2016-06-26: qty 5

## 2016-06-26 MED ORDER — PROPOFOL 10 MG/ML IV BOLUS
INTRAVENOUS | Status: DC | PRN
  Administered 2016-06-26: 120 mg via INTRAVENOUS

## 2016-06-26 MED ORDER — DEXAMETHASONE 4 MG PO TABS
4.0000 mg | ORAL_TABLET | Freq: Four times a day (QID) | ORAL | Status: AC
Start: 2016-06-26 — End: 2016-06-26
  Administered 2016-06-26: 4 mg via ORAL
  Filled 2016-06-26: qty 1

## 2016-06-26 MED ORDER — VANCOMYCIN HCL IN DEXTROSE 1-5 GM/200ML-% IV SOLN
1000.0000 mg | Freq: Once | INTRAVENOUS | Status: AC
  Administered 2016-06-26: 1000 mg via INTRAVENOUS

## 2016-06-26 MED ORDER — TERAZOSIN HCL 2 MG PO CAPS
2.0000 mg | ORAL_CAPSULE | Freq: Every day | ORAL | Status: DC
  Administered 2016-06-26 – 2016-06-27 (×2): 2 mg via ORAL
  Filled 2016-06-26 (×2): qty 1

## 2016-06-26 MED ORDER — THROMBIN 5000 UNITS EX SOLR
OROMUCOSAL | Status: DC | PRN
  Administered 2016-06-26: 20:00:00 via TOPICAL

## 2016-06-26 MED ORDER — HYDROCHLOROTHIAZIDE 25 MG PO TABS
25.0000 mg | ORAL_TABLET | Freq: Every day | ORAL | Status: DC
  Administered 2016-06-27: 25 mg via ORAL
  Filled 2016-06-26: qty 1

## 2016-06-26 MED ORDER — ONDANSETRON HCL 4 MG/2ML IJ SOLN: 4.0000 mg | Freq: Four times a day (QID) | INTRAMUSCULAR | Status: DC | PRN

## 2016-06-26 MED ORDER — ONDANSETRON HCL 4 MG PO TABS: 4.0000 mg | ORAL_TABLET | Freq: Four times a day (QID) | ORAL | Status: DC | PRN

## 2016-06-26 MED ORDER — LACTATED RINGERS IV SOLN: INTRAVENOUS | Status: DC

## 2016-06-26 MED ORDER — PROPOFOL 10 MG/ML IV BOLUS
INTRAVENOUS | Status: AC
  Filled 2016-06-26: qty 20

## 2016-06-26 MED ORDER — SODIUM CHLORIDE 0.9 % IV SOLN: 250.0000 mL | INTRAVENOUS | Status: DC

## 2016-06-26 MED ORDER — ONDANSETRON HCL 4 MG/2ML IJ SOLN
INTRAMUSCULAR | Status: DC | PRN
  Administered 2016-06-26: 4 mg via INTRAVENOUS

## 2016-06-26 MED ORDER — VANCOMYCIN HCL IN DEXTROSE 1-5 GM/200ML-% IV SOLN
INTRAVENOUS | Status: AC
  Filled 2016-06-26: qty 200

## 2016-06-26 MED ORDER — SODIUM CHLORIDE 0.9% FLUSH: 3.0000 mL | INTRAVENOUS | Status: DC | PRN

## 2016-06-26 MED ORDER — THROMBIN 5000 UNITS EX SOLR
CUTANEOUS | Status: DC | PRN
  Administered 2016-06-26 (×2): 5000 [IU] via TOPICAL

## 2016-06-26 MED ORDER — SUGAMMADEX SODIUM 200 MG/2ML IV SOLN
INTRAVENOUS | Status: DC | PRN
  Administered 2016-06-26: 200 mg via INTRAVENOUS

## 2016-06-26 MED ORDER — EPHEDRINE 5 MG/ML INJ
INTRAVENOUS | Status: AC
  Filled 2016-06-26: qty 10

## 2016-06-26 MED ORDER — ATORVASTATIN CALCIUM 20 MG PO TABS
20.0000 mg | ORAL_TABLET | Freq: Every evening | ORAL | Status: DC
  Administered 2016-06-27: 20 mg via ORAL
  Filled 2016-06-26: qty 1

## 2016-06-26 MED ORDER — FENTANYL CITRATE (PF) 100 MCG/2ML IJ SOLN
INTRAMUSCULAR | Status: DC | PRN
  Administered 2016-06-26: 50 ug via INTRAVENOUS
  Administered 2016-06-26: 150 ug via INTRAVENOUS
  Administered 2016-06-26 (×2): 50 ug via INTRAVENOUS

## 2016-06-26 MED ORDER — PANTOPRAZOLE SODIUM 40 MG PO TBEC: 40.0000 mg | DELAYED_RELEASE_TABLET | Freq: Every day | ORAL | Status: DC

## 2016-06-26 MED ORDER — SODIUM CHLORIDE 0.9 % IR SOLN
Status: DC | PRN
  Administered 2016-06-26: 20:00:00

## 2016-06-26 MED ORDER — LINAGLIPTIN 5 MG PO TABS
5.0000 mg | ORAL_TABLET | Freq: Every day | ORAL | Status: DC
  Administered 2016-06-26 – 2016-06-27 (×2): 5 mg via ORAL
  Filled 2016-06-26 (×3): qty 1

## 2016-06-26 MED ORDER — BACITRACIN 50000 UNITS IM SOLR
INTRAMUSCULAR | Status: DC | PRN
  Administered 2016-06-26: 07:00:00

## 2016-06-26 MED ORDER — PHENYLEPHRINE HCL 10 MG/ML IJ SOLN
INTRAMUSCULAR | Status: DC | PRN
  Administered 2016-06-26 (×4): 80 ug via INTRAVENOUS

## 2016-06-26 MED ORDER — CARVEDILOL 6.25 MG PO TABS
25.0000 mg | ORAL_TABLET | Freq: Two times a day (BID) | ORAL | Status: DC
  Administered 2016-06-26 – 2016-06-27 (×3): 25 mg via ORAL
  Filled 2016-06-26 (×4): qty 4

## 2016-06-26 MED ORDER — LACTATED RINGERS IV SOLN
INTRAVENOUS | Status: DC | PRN
  Administered 2016-06-26 (×2): via INTRAVENOUS

## 2016-06-26 MED ORDER — PHENYLEPHRINE 40 MCG/ML (10ML) SYRINGE FOR IV PUSH (FOR BLOOD PRESSURE SUPPORT)
PREFILLED_SYRINGE | INTRAVENOUS | Status: AC
  Filled 2016-06-26: qty 10

## 2016-06-26 MED ORDER — MENTHOL 3 MG MT LOZG
1.0000 | LOZENGE | OROMUCOSAL | Status: DC | PRN
  Filled 2016-06-26: qty 9

## 2016-06-26 MED ORDER — BACITRACIN ZINC 500 UNIT/GM EX OINT
TOPICAL_OINTMENT | CUTANEOUS | Status: AC
  Filled 2016-06-26: qty 28.35

## 2016-06-26 MED ORDER — BACITRACIN ZINC 500 UNIT/GM EX OINT
TOPICAL_OINTMENT | CUTANEOUS | Status: DC | PRN
  Administered 2016-06-26: 1 via TOPICAL

## 2016-06-26 MED ORDER — INSULIN ASPART 100 UNIT/ML ~~LOC~~ SOLN
SUBCUTANEOUS | Status: AC
  Administered 2016-06-26: 15 [IU] via SUBCUTANEOUS
  Filled 2016-06-26: qty 1

## 2016-06-26 MED ORDER — THROMBIN 5000 UNITS EX SOLR
CUTANEOUS | Status: AC
  Filled 2016-06-26: qty 5000

## 2016-06-26 MED ORDER — DOCUSATE SODIUM 100 MG PO CAPS
100.0000 mg | ORAL_CAPSULE | Freq: Two times a day (BID) | ORAL | Status: DC
  Administered 2016-06-27 (×2): 100 mg via ORAL
  Filled 2016-06-26 (×2): qty 1

## 2016-06-26 MED ORDER — BUPIVACAINE-EPINEPHRINE (PF) 0.5% -1:200000 IJ SOLN
INTRAMUSCULAR | Status: DC | PRN
  Administered 2016-06-26: 10 mL via PERINEURAL

## 2016-06-26 MED ORDER — ROCURONIUM BROMIDE 10 MG/ML (PF) SYRINGE
PREFILLED_SYRINGE | INTRAVENOUS | Status: DC | PRN
  Administered 2016-06-26: 50 mg via INTRAVENOUS

## 2016-06-26 MED ORDER — CYCLOBENZAPRINE HCL 10 MG PO TABS
10.0000 mg | ORAL_TABLET | Freq: Three times a day (TID) | ORAL | Status: DC | PRN
  Administered 2016-06-27 (×2): 10 mg via ORAL
  Filled 2016-06-26 (×2): qty 1

## 2016-06-26 MED ORDER — PHENOL 1.4 % MT LIQD
1.0000 | OROMUCOSAL | Status: DC | PRN
  Filled 2016-06-26: qty 177

## 2016-06-26 MED ORDER — METFORMIN HCL 500 MG PO TABS
500.0000 mg | ORAL_TABLET | Freq: Two times a day (BID) | ORAL | Status: DC
  Administered 2016-06-27 (×2): 500 mg via ORAL
  Filled 2016-06-26 (×2): qty 1

## 2016-06-26 MED ORDER — GLIPIZIDE ER 10 MG PO TB24
10.0000 mg | ORAL_TABLET | Freq: Two times a day (BID) | ORAL | Status: DC
  Administered 2016-06-27 (×2): 10 mg via ORAL
  Filled 2016-06-26 (×3): qty 1

## 2016-06-26 MED ORDER — LACTATED RINGERS IV SOLN
INTRAVENOUS | Status: DC | PRN
  Administered 2016-06-26 (×2): via INTRAVENOUS

## 2016-06-26 MED ORDER — MEPERIDINE HCL 25 MG/ML IJ SOLN: 6.2500 mg | INTRAMUSCULAR | Status: DC | PRN

## 2016-06-26 MED ORDER — HYDROMORPHONE HCL 1 MG/ML IJ SOLN: 0.2500 mg | INTRAMUSCULAR | Status: DC | PRN

## 2016-06-26 MED ORDER — SODIUM CHLORIDE 0.9% FLUSH
3.0000 mL | Freq: Two times a day (BID) | INTRAVENOUS | Status: DC
  Administered 2016-06-27: 3 mL via INTRAVENOUS

## 2016-06-26 MED ORDER — MIDAZOLAM HCL 5 MG/5ML IJ SOLN
INTRAMUSCULAR | Status: DC | PRN
  Administered 2016-06-26: 2 mg via INTRAVENOUS

## 2016-06-26 MED ORDER — ONDANSETRON HCL 4 MG/2ML IJ SOLN: 4.0000 mg | Freq: Once | INTRAMUSCULAR | Status: DC | PRN

## 2016-06-26 MED ORDER — LIDOCAINE HCL (CARDIAC) 20 MG/ML IV SOLN
INTRAVENOUS | Status: DC | PRN
  Administered 2016-06-26: 60 mg via INTRAVENOUS

## 2016-06-26 MED ORDER — SUCCINYLCHOLINE CHLORIDE 200 MG/10ML IV SOSY
PREFILLED_SYRINGE | INTRAVENOUS | Status: AC
  Filled 2016-06-26: qty 10

## 2016-06-26 MED ORDER — SUCCINYLCHOLINE 20MG/ML (10ML) SYRINGE FOR MEDFUSION PUMP - OPTIME
INTRAMUSCULAR | Status: DC | PRN
  Administered 2016-06-26: 140 mg via INTRAVENOUS

## 2016-06-26 MED ORDER — PANTOPRAZOLE SODIUM 40 MG IV SOLR
40.0000 mg | Freq: Every day | INTRAVENOUS | Status: DC
  Administered 2016-06-26 – 2016-06-27 (×2): 40 mg via INTRAVENOUS
  Filled 2016-06-26 (×2): qty 40

## 2016-06-26 MED ORDER — PHENYLEPHRINE HCL 10 MG/ML IJ SOLN
INTRAVENOUS | Status: DC | PRN
  Administered 2016-06-26: 10 ug/min via INTRAVENOUS
  Administered 2016-06-26: 10:00:00 via INTRAVENOUS

## 2016-06-26 MED ORDER — INSULIN ASPART 100 UNIT/ML ~~LOC~~ SOLN
0.0000 [IU] | SUBCUTANEOUS | Status: DC
  Administered 2016-06-26: 15 [IU] via SUBCUTANEOUS
  Administered 2016-06-26: 5 [IU] via SUBCUTANEOUS
  Administered 2016-06-27: 11 [IU] via SUBCUTANEOUS

## 2016-06-26 MED ORDER — VANCOMYCIN HCL IN DEXTROSE 1-5 GM/200ML-% IV SOLN
1000.0000 mg | Freq: Once | INTRAVENOUS | Status: AC
  Administered 2016-06-27: 1000 mg via INTRAVENOUS
  Filled 2016-06-26: qty 200

## 2016-06-26 MED ORDER — DEXAMETHASONE SODIUM PHOSPHATE 4 MG/ML IJ SOLN
4.0000 mg | Freq: Four times a day (QID) | INTRAMUSCULAR | Status: AC
  Administered 2016-06-26 (×2): 4 mg via INTRAVENOUS
  Filled 2016-06-26 (×2): qty 1

## 2016-06-26 MED ORDER — LOSARTAN POTASSIUM-HCTZ 100-25 MG PO TABS: 1.0000 | ORAL_TABLET | Freq: Every day | ORAL | Status: DC

## 2016-06-26 MED ORDER — LOSARTAN POTASSIUM 50 MG PO TABS
100.0000 mg | ORAL_TABLET | Freq: Every day | ORAL | Status: DC
  Administered 2016-06-27: 100 mg via ORAL
  Filled 2016-06-26: qty 2

## 2016-06-26 SURGICAL SUPPLY — 54 items
BAG DECANTER FOR FLEXI CONT (MISCELLANEOUS) ×3 IMPLANT
BENZOIN TINCTURE PRP APPL 2/3 (GAUZE/BANDAGES/DRESSINGS) ×3 IMPLANT
BUR MATCHSTICK NEURO 3.0 LAGG (BURR) ×3 IMPLANT
CANISTER SUCT 3000ML PPV (MISCELLANEOUS) ×3 IMPLANT
CARTRIDGE OIL MAESTRO DRILL (MISCELLANEOUS) ×1 IMPLANT
CLOSURE WOUND 1/2 X4 (GAUZE/BANDAGES/DRESSINGS) ×1
DERMABOND ADVANCED (GAUZE/BANDAGES/DRESSINGS) ×2
DERMABOND ADVANCED .7 DNX12 (GAUZE/BANDAGES/DRESSINGS) ×1 IMPLANT
DIFFUSER DRILL AIR PNEUMATIC (MISCELLANEOUS) ×3 IMPLANT
DRAPE C-ARM 42X72 X-RAY (DRAPES) ×6 IMPLANT
DRAPE LAPAROTOMY 100X72 PEDS (DRAPES) ×3 IMPLANT
DRAPE MICROSCOPE LEICA (MISCELLANEOUS) ×3 IMPLANT
DRAPE POUCH INSTRU U-SHP 10X18 (DRAPES) ×3 IMPLANT
DRSG OPSITE 4X5.5 SM (GAUZE/BANDAGES/DRESSINGS) ×3 IMPLANT
DURAPREP 6ML APPLICATOR 50/CS (WOUND CARE) ×3 IMPLANT
ELECT COATED BLADE 2.86 ST (ELECTRODE) ×3 IMPLANT
ELECT REM PT RETURN 9FT ADLT (ELECTROSURGICAL) ×3
ELECTRODE REM PT RTRN 9FT ADLT (ELECTROSURGICAL) ×1 IMPLANT
GAUZE SPONGE 4X4 12PLY STRL (GAUZE/BANDAGES/DRESSINGS) IMPLANT
GAUZE SPONGE 4X4 16PLY XRAY LF (GAUZE/BANDAGES/DRESSINGS) IMPLANT
GLOVE BIO SURGEON STRL SZ 6.5 (GLOVE) ×2 IMPLANT
GLOVE BIO SURGEON STRL SZ8 (GLOVE) ×3 IMPLANT
GLOVE BIO SURGEONS STRL SZ 6.5 (GLOVE) ×1
GLOVE BIOGEL PI IND STRL 7.0 (GLOVE) ×3 IMPLANT
GLOVE BIOGEL PI IND STRL 7.5 (GLOVE) ×2 IMPLANT
GLOVE BIOGEL PI INDICATOR 7.0 (GLOVE) ×6
GLOVE BIOGEL PI INDICATOR 7.5 (GLOVE) ×4
GLOVE INDICATOR 8.5 STRL (GLOVE) ×3 IMPLANT
GOWN STRL REUS W/ TWL LRG LVL3 (GOWN DISPOSABLE) ×1 IMPLANT
GOWN STRL REUS W/ TWL XL LVL3 (GOWN DISPOSABLE) ×1 IMPLANT
GOWN STRL REUS W/TWL 2XL LVL3 (GOWN DISPOSABLE) IMPLANT
GOWN STRL REUS W/TWL LRG LVL3 (GOWN DISPOSABLE) ×2
GOWN STRL REUS W/TWL XL LVL3 (GOWN DISPOSABLE) ×2
HALTER HD/CHIN CERV TRACTION D (MISCELLANEOUS) ×3 IMPLANT
HEMOSTAT POWDER KIT SURGIFOAM (HEMOSTASIS) ×3 IMPLANT
KIT BASIN OR (CUSTOM PROCEDURE TRAY) ×3 IMPLANT
KIT ROOM TURNOVER OR (KITS) ×3 IMPLANT
NEEDLE HYPO 18GX1.5 BLUNT FILL (NEEDLE) ×3 IMPLANT
NEEDLE SPNL 20GX3.5 QUINCKE YW (NEEDLE) ×3 IMPLANT
NS IRRIG 1000ML POUR BTL (IV SOLUTION) ×3 IMPLANT
OIL CARTRIDGE MAESTRO DRILL (MISCELLANEOUS) ×3
PACK LAMINECTOMY NEURO (CUSTOM PROCEDURE TRAY) ×3 IMPLANT
PAD ARMBOARD 7.5X6 YLW CONV (MISCELLANEOUS) ×9 IMPLANT
RUBBERBAND STERILE (MISCELLANEOUS) ×6 IMPLANT
SEALANT ADHERUS EXTEND TIP (MISCELLANEOUS) ×2 IMPLANT
SPONGE INTESTINAL PEANUT (DISPOSABLE) IMPLANT
SPONGE SURGIFOAM ABS GEL SZ50 (HEMOSTASIS) ×3 IMPLANT
STRIP CLOSURE SKIN 1/2X4 (GAUZE/BANDAGES/DRESSINGS) ×2 IMPLANT
SUT VIC AB 3-0 SH 8-18 (SUTURE) ×3 IMPLANT
SUT VICRYL 4-0 PS2 18IN ABS (SUTURE) ×3 IMPLANT
TOWEL GREEN STERILE (TOWEL DISPOSABLE) ×2 IMPLANT
TOWEL GREEN STERILE FF (TOWEL DISPOSABLE) ×3 IMPLANT
TRAP SPECIMEN MUCOUS 40CC (MISCELLANEOUS) ×3 IMPLANT
WATER STERILE IRR 1000ML POUR (IV SOLUTION) ×3 IMPLANT

## 2016-06-26 SURGICAL SUPPLY — 59 items
BAG DECANTER FOR FLEXI CONT (MISCELLANEOUS) ×3 IMPLANT
BASKET BONE COLLECTION (BASKET) ×2 IMPLANT
BENZOIN TINCTURE PRP APPL 2/3 (GAUZE/BANDAGES/DRESSINGS) ×3 IMPLANT
BIT DRILL NEURO 2X3.1 SFT TUCH (MISCELLANEOUS) ×1 IMPLANT
BLADE SURG 15 STRL LF DISP TIS (BLADE) ×2 IMPLANT
BLADE SURG 15 STRL SS (BLADE) ×4
BLADE ULTRA TIP 2M (BLADE) ×3 IMPLANT
BUR BARREL STRAIGHT FLUTE 4.0 (BURR) ×3 IMPLANT
BUR MATCHSTICK NEURO 3.0 LAGG (BURR) ×3 IMPLANT
CANISTER SUCT 3000ML PPV (MISCELLANEOUS) ×3 IMPLANT
CARTRIDGE OIL MAESTRO DRILL (MISCELLANEOUS) ×1 IMPLANT
CLOSURE WOUND 1/2 X4 (GAUZE/BANDAGES/DRESSINGS) ×1
COVER MAYO STAND STRL (DRAPES) ×3 IMPLANT
DEVICE FUSION VIST S 14X14X6MM (Trauma) ×3 IMPLANT
DIFFUSER DRILL AIR PNEUMATIC (MISCELLANEOUS) ×3 IMPLANT
DRAPE LAPAROTOMY 100X72 PEDS (DRAPES) ×3 IMPLANT
DRAPE MICROSCOPE LEICA (MISCELLANEOUS) ×3 IMPLANT
DRAPE POUCH INSTRU U-SHP 10X18 (DRAPES) ×3 IMPLANT
DRAPE SURG 17X23 STRL (DRAPES) ×6 IMPLANT
DRILL NEURO 2X3.1 SOFT TOUCH (MISCELLANEOUS) ×3
ELECT REM PT RETURN 9FT ADLT (ELECTROSURGICAL) ×3
ELECTRODE REM PT RTRN 9FT ADLT (ELECTROSURGICAL) ×1 IMPLANT
GAUZE SPONGE 4X4 12PLY STRL (GAUZE/BANDAGES/DRESSINGS) ×3 IMPLANT
GAUZE SPONGE 4X4 16PLY XRAY LF (GAUZE/BANDAGES/DRESSINGS) ×3 IMPLANT
GLOVE BIO SURGEON STRL SZ8 (GLOVE) ×3 IMPLANT
GLOVE BIO SURGEON STRL SZ8.5 (GLOVE) ×3 IMPLANT
GLOVE EXAM NITRILE LRG STRL (GLOVE) IMPLANT
GLOVE EXAM NITRILE XL STR (GLOVE) IMPLANT
GLOVE EXAM NITRILE XS STR PU (GLOVE) IMPLANT
GOWN STRL REUS W/ TWL LRG LVL3 (GOWN DISPOSABLE) IMPLANT
GOWN STRL REUS W/ TWL XL LVL3 (GOWN DISPOSABLE) ×2 IMPLANT
GOWN STRL REUS W/TWL LRG LVL3 (GOWN DISPOSABLE)
GOWN STRL REUS W/TWL XL LVL3 (GOWN DISPOSABLE) ×4
HEMOSTAT POWDER KIT SURGIFOAM (HEMOSTASIS) ×3 IMPLANT
KIT BASIN OR (CUSTOM PROCEDURE TRAY) ×3 IMPLANT
KIT ROOM TURNOVER OR (KITS) ×3 IMPLANT
MARKER SKIN DUAL TIP RULER LAB (MISCELLANEOUS) ×3 IMPLANT
NEEDLE HYPO 22GX1.5 SAFETY (NEEDLE) ×3 IMPLANT
NEEDLE SPNL 18GX3.5 QUINCKE PK (NEEDLE) ×6 IMPLANT
NS IRRIG 1000ML POUR BTL (IV SOLUTION) ×3 IMPLANT
OIL CARTRIDGE MAESTRO DRILL (MISCELLANEOUS) ×3
PACK LAMINECTOMY NEURO (CUSTOM PROCEDURE TRAY) ×3 IMPLANT
PATTIES SURGICAL 1X1 (DISPOSABLE) ×3 IMPLANT
PIN DISTRACTION 14MM (PIN) ×6 IMPLANT
PLATE ANT CERV XTEND 3 LV 45 (Plate) ×3 IMPLANT
PUTTY KINEX BIOACTIVE 5CC (Bone Implant) ×3 IMPLANT
RUBBERBAND STERILE (MISCELLANEOUS) ×6 IMPLANT
SCREW XTD VAR 4.2 SELF TAP (Screw) ×24 IMPLANT
SPONGE INTESTINAL PEANUT (DISPOSABLE) ×6 IMPLANT
SPONGE SURGIFOAM ABS GEL 100 (HEMOSTASIS) ×6 IMPLANT
STRIP CLOSURE SKIN 1/2X4 (GAUZE/BANDAGES/DRESSINGS) ×2 IMPLANT
SUT VIC AB 0 CT1 27 (SUTURE) ×2
SUT VIC AB 0 CT1 27XBRD ANTBC (SUTURE) ×1 IMPLANT
SUT VIC AB 3-0 SH 8-18 (SUTURE) ×3 IMPLANT
TAPE CLOTH SURG 6X10 WHT LF (GAUZE/BANDAGES/DRESSINGS) ×3 IMPLANT
TOWEL GREEN STERILE (TOWEL DISPOSABLE) ×2 IMPLANT
TOWEL GREEN STERILE FF (TOWEL DISPOSABLE) ×3 IMPLANT
VISTA S O 14X14X6MM (Trauma) ×9 IMPLANT
WATER STERILE IRR 1000ML POUR (IV SOLUTION) ×3 IMPLANT

## 2016-06-26 NOTE — Transfer of Care (Signed)
Immediate Anesthesia Transfer of Care Note  Patient: Jeffery Fernandez  Procedure(s) Performed: Procedure(s): Re-Exploration of Cervical Wound (N/A)  Patient Location: PACU  Anesthesia Type:General  Level of Consciousness: awake, oriented and sedated  Airway & Oxygen Therapy: Patient connected to nasal cannula oxygen  Post-op Assessment: Report given to RN, Post -op Vital signs reviewed and stable and Patient moving all extremities X 4  Post vital signs: Reviewed and stable  Last Vitals:  Vitals:   06/26/16 1256 06/26/16 1600  BP: (!) 173/90 (!) 156/84  Pulse: 85 87  Resp: 18 16  Temp: 36.7 C 36.8 C    Last Pain:  Vitals:   06/26/16 1607  TempSrc:   PainSc: 6       Patients Stated Pain Goal: 3 (82/88/33 [id number])  Complications: No apparent anesthesia complications

## 2016-06-26 NOTE — Anesthesia Postprocedure Evaluation (Signed)
Anesthesia Post Note  Patient: Jeffery Fernandez  Procedure(s) Performed: Procedure(s) (LRB): Re-Exploration of Cervical Wound (N/A)  Patient location during evaluation: PACU Anesthesia Type: General Level of consciousness: awake and alert Pain management: pain level controlled Vital Signs Assessment: post-procedure vital signs reviewed and stable Respiratory status: spontaneous breathing, nonlabored ventilation, respiratory function stable and patient connected to nasal cannula oxygen Cardiovascular status: blood pressure returned to baseline and stable Postop Assessment: no signs of nausea or vomiting Anesthetic complications: no       Last Vitals:  Vitals:   06/26/16 2130 06/26/16 2145  BP: (!) 170/93 (!) 172/92  Pulse: 85 84  Resp: 17 17  Temp:      Last Pain:  Vitals:   06/26/16 2130  TempSrc:   PainSc: 0-No pain                 Edward Trevino DAVID

## 2016-06-26 NOTE — Anesthesia Preprocedure Evaluation (Signed)
Anesthesia Evaluation  Patient identified by MRN, date of birth, ID band Patient awake    Reviewed: Allergy & Precautions, NPO status , Patient's Chart, lab work & pertinent test results  Airway Mallampati: II  TM Distance: >3 FB     Dental   Pulmonary sleep apnea , COPD, former smoker,    Pulmonary exam normal        Cardiovascular hypertension, Pt. on medications + CAD  Normal cardiovascular exam     Neuro/Psych    GI/Hepatic   Endo/Other  diabetes, Type 2, Oral Hypoglycemic Agents, Insulin Dependent  Renal/GU Renal InsufficiencyRenal disease     Musculoskeletal   Abdominal   Peds  Hematology   Anesthesia Other Findings   Reproductive/Obstetrics                             Anesthesia Physical Anesthesia Plan  ASA: III and emergent  Anesthesia Plan: General   Post-op Pain Management:    Induction: Intravenous, Rapid sequence and Cricoid pressure planned  Airway Management Planned: Oral ETT  Additional Equipment:   Intra-op Plan:   Post-operative Plan: Extubation in OR  Informed Consent: I have reviewed the patients History and Physical, chart, labs and discussed the procedure including the risks, benefits and alternatives for the proposed anesthesia with the patient or authorized representative who has indicated his/her understanding and acceptance.     Plan Discussed with: CRNA and Surgeon  Anesthesia Plan Comments:         Anesthesia Quick Evaluation

## 2016-06-26 NOTE — Anesthesia Preprocedure Evaluation (Addendum)
Anesthesia Evaluation  Patient identified by MRN, date of birth, ID band Patient awake    Reviewed: Allergy & Precautions, NPO status , Patient's Chart, lab work & pertinent test results  Airway Mallampati: III  TM Distance: >3 FB Neck ROM: Full    Dental no notable dental hx. (+) Edentulous Upper,    Pulmonary sleep apnea and Continuous Positive Airway Pressure Ventilation , former smoker,    Pulmonary exam normal breath sounds clear to auscultation       Cardiovascular hypertension, Pt. on medications and Pt. on home beta blockers + CAD  Normal cardiovascular exam Rhythm:Regular Rate:Normal  ECG: SB, rate 59. LBBB ECHO:  Echo revealed LVEF 55% and was otherwise unremarkable.  Over 4 MET's   Neuro/Psych Depression negative neurological ROS     GI/Hepatic negative GI ROS, Neg liver ROS,   Endo/Other  diabetes, Oral Hypoglycemic Agents  Renal/GU CRFRenal disease  negative genitourinary   Musculoskeletal negative musculoskeletal ROS (+)   Abdominal   Peds negative pediatric ROS (+)  Hematology  (+) anemia ,   Anesthesia Other Findings Hyperlipidemia Pre-op eval per Cardiology Oval Linsey)  Reproductive/Obstetrics negative OB ROS                            Anesthesia Physical Anesthesia Plan  ASA: III  Anesthesia Plan: General   Post-op Pain Management:    Induction: Intravenous  Airway Management Planned: Oral ETT  Additional Equipment:   Intra-op Plan:   Post-operative Plan: Extubation in OR  Informed Consent: I have reviewed the patients History and Physical, chart, labs and discussed the procedure including the risks, benefits and alternatives for the proposed anesthesia with the patient or authorized representative who has indicated his/her understanding and acceptance.   Dental advisory given  Plan Discussed with: CRNA  Anesthesia Plan Comments:          Anesthesia Quick Evaluation

## 2016-06-26 NOTE — Anesthesia Postprocedure Evaluation (Signed)
Anesthesia Post Note  Patient: Crespin H Bradsher  Procedure(s) Performed: Procedure(s) (LRB): ANTERIOR CERVICAL DECOMPRESSION/DISCECTOMY FUSION, INTERBODY PROSTESIS, PLATE, CERVICAL THREE CERVICAL FOUR, CERVICAL FOUR CERVICAL FIVE CERVICAL SIX (N/A)  Patient location during evaluation: PACU Anesthesia Type: General Level of consciousness: awake and alert Pain management: pain level controlled Vital Signs Assessment: post-procedure vital signs reviewed and stable Respiratory status: spontaneous breathing, nonlabored ventilation, respiratory function stable and patient connected to nasal cannula oxygen Cardiovascular status: blood pressure returned to baseline and stable Postop Assessment: no signs of nausea or vomiting Anesthetic complications: no       Last Vitals:  Vitals:   06/26/16 1230 06/26/16 1256  BP: (!) 151/79 (!) 173/90  Pulse: 80 85  Resp: 18 18  Temp:  36.7 C    Last Pain:  Vitals:   06/26/16 1230  TempSrc:   PainSc: 0-No pain                 Ryan P Ellender

## 2016-06-26 NOTE — Progress Notes (Signed)
Patient ID: Jeffery Fernandez, male   DOB: 08-02-1945, 71 y.o.   MRN: [id number] Called to see patient for increased swelling anteriorly in his neck. Patient to have a significantly impressive difficulty swallowing denies any breathing difficulty. This began after he had some difficulty swallowing his meal with some choking.  Large anterior left neck swelling ballotable consistent with either hematoma or spinal fluid more likely spinal fluid based on his lack of firm organization. However due to the size and location of the swelling I have recommended reexploration of anterior cervical wound for evacuation of fluid collection and possible repair of CSF leak. I have extensively gone over the risks and benefits of the procedure with the patient and family as well as perioperative course expertise about alternatives to surgery and they understand and agreed to proceed forward.

## 2016-06-26 NOTE — Anesthesia Procedure Notes (Signed)
Procedure Name: Intubation Date/Time: 06/26/2016 7:52 PM Performed by: Valetta Fuller Pre-anesthesia Checklist: Patient identified, Emergency Drugs available, Suction available and Patient being monitored Patient Re-evaluated:Patient Re-evaluated prior to inductionOxygen Delivery Method: Circle system utilized Preoxygenation: Pre-oxygenation with 100% oxygen Intubation Type: IV induction, Rapid sequence and Cricoid Pressure applied Laryngoscope Size: Miller and 2 Grade View: Grade II Tube type: Oral Tube size: 7.5 mm Number of attempts: 1 Airway Equipment and Method: Stylet Placement Confirmation: ETT inserted through vocal cords under direct vision,  positive ETCO2 and breath sounds checked- equal and bilateral Secured at: 25 cm Tube secured with: Tape Dental Injury: Teeth and Oropharynx as per pre-operative assessment

## 2016-06-26 NOTE — Op Note (Signed)
Brief history: The patient is a 71 year old white male who has complained of left greater than right extremity numbness and weakness. He has failed medical management and was worked up with a cervical MRI. This demonstrates spondylosis, stenosis, etc. at C3-4, C4-5 and C5-6. I discussed the various treatment options with the patient including surgery. He has weighed the risks, benefits, and alternative surgery and decided to proceed with C3-4, C4-5 and C5-6 anterior cervical discectomy, fusion, and plating.  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 Earle Gell  Asst.: Dr. Saintclair Halsted  Anesthesia: Gen. endotracheal  Estimated blood loss: 150 mL  Drains: None  Complications: None  Description of procedure: The patient was brought to the operating room by the anesthesia team. General endotracheal anesthesia was induced. A roll was placed under the patient's shoulders to keep the neck in the neutral position. The patient's anterior cervical region was then prepared with Betadine scrub and Betadine solution. Sterile drapes were applied.  The area to be incised was then injected with Marcaine with epinephrine solution. I then used a scalpel to make a transverse incision in the patient's left anterior neck. I used the Metzenbaum scissors to divide the platysmal muscle and then to dissect medial to the sternocleidomastoid muscle, jugular vein, and carotid artery. I carefully dissected down towards the anterior cervical spine identifying the  esophagus and retracting it medially. Then using Kitner swabs to clear soft tissue from the anterior cervical spine. We then inserted a bent spinal needle into the upper exposed intervertebral disc space. We then obtained intraoperative radiographs confirm our location.  I then used electrocautery to detach the medial border of the longus colli muscle bilaterally from the C3-4, C4-5 and C5-6 intervertebral disc spaces. I then inserted the Caspar self-retaining retractor underneath the longus colli muscle bilaterally to provide exposure.  We then incised the intervertebral disc at C4-5. We then performed a partial intervertebral discectomy with a pituitary forceps and the Karlin curettes. I then inserted distraction screws into the vertebral bodies at C4 and C5. We then distracted the interspace. We then used the high-speed drill to decorticate the vertebral endplates at R4-8, to drill away the remainder of the intervertebral disc, to drill away some posterior spondylosis, and to thin out the posterior longitudinal ligament. I then incised ligament with the arachnoid knife. We then removed the ligament with a Kerrison punches undercutting the vertebral endplates and decompressing the thecal sac. We then performed foraminotomies about the bilateral C5 nerve roots. This completed the decompression at this level.  We then repeated this procedure and analogous fashion at C3-4 and C5-6 decompressing the thecal sac at these levels as well as bilateral C4 and C6 nerve roots.  We now turned our to attention to the interbody fusion. We used the trial spacers to determine the appropriate size for the interbody prosthesis. We then pre-filled prosthesis with a combination of local morcellized autograft bone that we obtained during decompression as well as Kinnex bone graft extender. We then inserted the prosthesis into the distracted interspace at C3-4, C4-5 and C5-6. We then removed the distraction screws. There was a  good snug fit of the  prosthesis in the interspace.  Having completed the fusion we now turned attention to the anterior spinal instrumentation. We used the high-speed drill to drill away some anterior spondylosis at the disc spaces so that the plate lay down flat. We selected the appropriate length titanium anterior cervical plate. We laid it along the anterior aspect of the vertebral bodies from C3-C6. We then drilled 14 mm holes at C3, C4, C5 and C6 We then secured the plate to the vertebral bodies by placing two 14tapping screws at C3, C4, C5 and C6We then obtained intraoperative radiograph. The demonstrating good position of the instrumentation. We therefore secured the screws the plate the locking each cam. This completed the instrumentation.  We then obtained hemostasis using bipolar electrocautery. We irrigated the wound out with bacitracin solution. We then removed the retractor. We inspected the esophagus for any damage. There was none apparent. We then reapproximated patient's platysmal muscle with interrupted 3-0 Vicryl suture. We then reapproximated the subcutaneous tissue with interrupted 3-0 Vicryl suture. The skin was reapproximated with Steri-Strips and benzoin. The wound was then covered with bacitracin ointment. A sterile dressing was applied. The drapes were removed. Patient was subsequently extubated by the anesthesia team and transported to the post anesthesia care unit in stable condition. All sponge instrument and needle counts were reportedly correct at the end of this case.

## 2016-06-26 NOTE — Progress Notes (Signed)
Pharmacy Antibiotic Note  Jeffery Fernandez is a 71 y.o. male admitted on 06/26/2016 with surgical prophylaxis.  Pharmacy has been consulted for vancomycin dosing.  Received pre-op vancomycin 1g at 1930.  Plan: Vancomycin 1g IV x1 to be given 5/31 at [facility] to sign off as no drain in place and no further doses required  Weight: 211 lb 9.6 oz (96 kg)  Temp (24hrs), Avg:98.1 F (36.7 C), Min:97.7 F (36.5 C), Max:98.8 F (37.1 C)  No results for input(s): WBC, CREATININE, LATICACIDVEN, VANCOTROUGH, VANCOPEAK, VANCORANDOM, GENTTROUGH, GENTPEAK, GENTRANDOM, TOBRATROUGH, TOBRAPEAK, TOBRARND, AMIKACINPEAK, AMIKACINTROU, AMIKACIN in the last 168 hours.  Estimated Creatinine Clearance: 48.7 mL/min (A) (by C-G formula based on SCr of 1.57 mg/dL (H)).    Allergies  Allergen Reactions  . Penicillins Other (See Comments)    Has patient had a PCN reaction causing immediate rash, facial/tongue/throat swelling, SOB or lightheadedness with hypotension:unknown Has patient had a PCN reaction causing severe rash involving mucus membranes or skin necrosis: Unknown Has patient had a PCN reaction that required hospitalization: Unknown Has patient had a PCN reaction occurring within the last 10 years: No Childhood reaction If all of the above answers are "NO", then may proceed with Cephalosporin use.      Jeffery Fernandez 06/26/2016 10:33 PM

## 2016-06-26 NOTE — Anesthesia Procedure Notes (Signed)
Procedure Name: Intubation Date/Time: 06/26/2016 7:45 AM Performed by: Merrilyn Puma B Pre-anesthesia Checklist: Patient identified, Emergency Drugs available, Suction available, Patient being monitored and Timeout performed Patient Re-evaluated:Patient Re-evaluated prior to inductionOxygen Delivery Method: Circle system utilized Preoxygenation: Pre-oxygenation with 100% oxygen Intubation Type: IV induction Ventilation: Oral airway inserted - appropriate to patient size and Mask ventilation without difficulty Laryngoscope Size: Mac and 4 Grade View: Grade I Tube type: Oral Tube size: 7.5 mm Number of attempts: 1 Airway Equipment and Method: Stylet Placement Confirmation: ETT inserted through vocal cords under direct vision,  positive ETCO2,  CO2 detector and breath sounds checked- equal and bilateral Secured at: 23 cm Tube secured with: Tape Dental Injury: Teeth and Oropharynx as per pre-operative assessment  Comments: Head and neck remained neutral for intubation.

## 2016-06-26 NOTE — H&P (Signed)
Subjective: Patient is a 71 year old white male with multiple medical issues who has complained of right arm and leg pain, numbness, tingling and weakness. He has failed medical management and was worked up with a cervical MRI. This demonstrated severe stenosis at C3-4, C4-5 and C5-6. I discussed the various treatment with the patient. He has decided to proceed with surgery.  Past Medical History:  Diagnosis Date  . Arrhythmia   . Arthritis   . Chest pain, unspecified   . Chronic airway obstruction, not elsewhere classified    PT. DENIES HE HAS COPD  . CKD (chronic kidney disease)   . Colon polyps    Adenomatous Polyps 2007  . Depression   . Diabetes mellitus   . Diverticulosis   . Gout   . Heart murmur   . Hyperlipemia   . Hypertension   . Nephrolithiasis   . Obstructive sleep apnea (adult) (pediatric)   . Other and unspecified hyperlipidemia   . Other left bundle branch block   . Retinal vascular occlusion, unspecified    right  . Shortness of breath    on exertion  . Unspecified sleep apnea   . Vision loss of right eye     Past Surgical History:  Procedure Laterality Date  . CATARACT EXTRACTION Bilateral   . KIDNEY STONE SURGERY      Allergies  Allergen Reactions  . Penicillins Other (See Comments)    Has patient had a PCN reaction causing immediate rash, facial/tongue/throat swelling, SOB or lightheadedness with hypotension:unknown Has patient had a PCN reaction causing severe rash involving mucus membranes or skin necrosis: Unknown Has patient had a PCN reaction that required hospitalization: Unknown Has patient had a PCN reaction occurring within the last 10 years: No Childhood reaction If all of the above answers are "NO", then may proceed with Cephalosporin use.     Social History  Substance Use Topics  . Smoking status: Former Smoker    Packs/day: 1.50    Years: 25.00    Types: Cigarettes    Quit date: 01/28/1986  . Smokeless tobacco: Former Systems developer  .  Alcohol use No     Comment: Very rare    Family History  Problem Relation Age of Onset  . Heart attack Brother 60       heart attack and CHF/ also some form of EP ablation  . Parkinson's disease Brother   . Atrial fibrillation Brother   . Heart disease Father   . Stroke Father   . Uterine cancer Mother   . Arthritis/Rheumatoid Cousin   . Diabetes Other   . Colon cancer Neg Hx   . Stomach cancer Neg Hx   . Liver cancer Neg Hx   . Rectal cancer Neg Hx   . Esophageal cancer Neg Hx    Prior to Admission medications   Medication Sig Start Date End Date Taking? Authorizing Provider  acetaminophen (TYLENOL) 500 MG tablet Take 1,000 mg by mouth every 6 (six) hours as needed (for pain.).   Yes [provider]  aspirin EC 81 MG tablet Take 81 mg by mouth daily.   Yes [provider]  atorvastatin (LIPITOR) 40 MG tablet Take 20 mg by mouth every evening.   Yes [provider]  carvedilol (COREG) 25 MG tablet Take 1 tablet (25 mg total) by mouth 2 (two) times daily with a meal. 06/20/16  Yes Skeet Latch, MD  clopidogrel (PLAVIX) 75 MG tablet Take 75 mg by mouth daily.  Yes [provider]  glipiZIDE (GLUCOTROL XL) 10 MG 24 hr tablet Take 10 mg by mouth 2 (two) times daily.   Yes [provider]  Ketotifen Fumarate (ITCHY EYE DROPS OP) Place 1 drop into both eyes daily as needed (for itchy eyes.).   Yes [provider]  losartan-hydrochlorothiazide (HYZAAR) 100-25 MG tablet Take 1 tablet by mouth daily.  05/25/16  Yes [provider]  metFORMIN (GLUCOPHAGE) 500 MG tablet Take 500 mg by mouth 2 (two) times daily with a meal.     Yes [provider]  pantoprazole (PROTONIX) 40 MG tablet Take by mouth. 03/14/16 03/14/17 Yes [provider]  sertraline (ZOLOFT) 50 MG tablet Take 50 mg by mouth daily.     Yes [provider]  sitaGLIPtan (JANUVIA) 100 MG tablet Take 100 mg by mouth daily.     Yes [provider]  terazosin (HYTRIN) 2 MG capsule Take 2 mg by mouth at bedtime.     Yes [provider]     Review of Systems  Positive ROS: As above  All other systems have been reviewed and were otherwise negative with the exception of those mentioned in the HPI and as above.  Objective: Vital signs in last 24 hours: Temp:  [97.7 F (36.5 C)] 97.7 F (36.5 C) (05/30 0644) Pulse Rate:  [65] 65 (05/30 0642) Resp:  [20] 20 (05/30 0642) BP: (197-203)/(82-83) 197/82 (05/30 0647) SpO2:  [97 %] 97 % (05/30 0642) Weight:  [96 kg (211 lb 9.6 oz)] 96 kg (211 lb 9.6 oz) (05/30 0642)  General Appearance: Alert Head: Normocephalic, without obvious abnormality, atraumatic Eyes: PERRL, conjunctiva/corneas clear, EOM's intact,    Ears: Normal  Throat: Normal  Neck: Supple, Back: unremarkable Lungs: Clear to auscultation bilaterally, respirations unlabored Heart: Regular rate and rhythm, no murmur, rub or gallop Abdomen: Soft, non-tender Extremities: Extremities normal, atraumatic, no cyanosis or edema Skin: unremarkable  NEUROLOGIC:   Mental status: alert and oriented,Motor Exam - grossly normal Sensory Exam - grossly normal Reflexes:  Coordination - grossly normal Gait - grossly normal Balance - grossly normal Cranial Nerves: I: smell Not tested  II: visual acuity  OS: Normal  OD: Normal   II: visual fields Full to confrontation  II: pupils Equal, round, reactive to light  III,VII: ptosis None  III,IV,VI: extraocular muscles  Full ROM  V: mastication Normal  V: facial light touch sensation  Normal  V,VII: corneal reflex  Present  VII: facial muscle function - upper  Normal  VII: facial muscle function - lower Normal  VIII: hearing Not tested  IX: soft palate elevation  Normal  IX,X: gag reflex Present  XI: trapezius strength  5/5  XI: sternocleidomastoid strength 5/5  XI: neck flexion strength  5/5  XII: tongue strength  Normal    Data Review Lab Results   Component Value Date   WBC 7.4 06/19/2016   HGB 12.9 (L) 06/19/2016   HCT 38.3 (L) 06/19/2016   MCV 88.7 06/19/2016   PLT 161 06/19/2016   Lab Results  Component Value Date   NA 141 06/19/2016   K 3.8 06/19/2016   CL 103 06/19/2016   CO2 29 06/19/2016   BUN 23 (H) 06/19/2016   CREATININE 1.57 (H) 06/19/2016   GLUCOSE 131 (H) 06/19/2016   Lab Results  Component Value Date   INR 1.1 (H) 06/08/2010    Assessment/Plan: C3-4, 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 We have discussed the various treatment options including surgery. I have described the surgical treatment option of a C3-4, C4-5 and C5-6 anterior cervical discectomy, fusion, and plating. I have shown them surgical models. We have discussed the risks, benefits, alternatives, expected postoperative course, and likelihood of achieving our goals with surgery. I have answered all patient's questions. He has decided to proceed with surgery.   Carmeline Kowal D 06/26/2016 7:23 AM

## 2016-06-26 NOTE — Progress Notes (Signed)
Subjective:  The patient is somnolent but easily arousable. He is in no apparent distress.  Objective: Vital signs in last 24 hours: Temp:  [97.7 F (36.5 C)-98.8 F (37.1 C)] 98.8 F (37.1 C) (05/30 1130) Pulse Rate:  [65-83] 78 (05/30 1145) Resp:  [17-20] 17 (05/30 1145) BP: (142-203)/(74-83) 142/77 (05/30 1145) SpO2:  [88 %-97 %] 88 % (05/30 1145) Weight:  [96 kg (211 lb 9.6 oz)] 96 kg (211 lb 9.6 oz) (05/30 0642)  Intake/Output from previous day: No intake/output data recorded. Intake/Output this shift: Total I/O In: 1000 [I.V.:1000] Out: 150 [Blood:150]  Physical exam the patient his somnolent but arousable. He is moving all 4 extremities well. His dressing is clean and dry. There is no hematoma or shift.  Lab Results: No results for input(s): WBC, HGB, HCT, PLT in the last 72 hours. BMET No results for input(s): NA, K, CL, CO2, GLUCOSE, BUN, CREATININE, CALCIUM in the last 72 hours.  Studies/Results: Dg Cervical Spine 2-3 Views  Result Date: 06/26/2016 CLINICAL DATA:  Anterior cervical decompression and discectomy and fusion intraoperative radiographs. EXAM: CERVICAL SPINE - 2-3 VIEW COMPARISON:  Cervical spine MRI of April 28, 2016 FINDINGS: The image labeled #1 reveals a metallic needle projecting along the anterior aspect of the C2-3 disc space. The image labeled #2 reveals the metallic needle projecting along the anterior 8 mm of the C3-4 disc space. The image labeled #3 reveals an anterior fusion plate extending from C3 through C6 with intradiscal devices at C3-4, C4-5, and C5-6. Surgical sponges are present in the prevertebral soft tissues. IMPRESSION: Lateral intraoperative radiographs with findings as described above. Electronically Signed   By: David  Martinique M.D.   On: 06/26/2016 11:21    Assessment/Plan: The patient is doing well. I spoke with his family.  LOS: 0 days     Sabrie Moritz D 06/26/2016, 12:07 PM

## 2016-06-26 NOTE — Op Note (Signed)
Preoperative diagnosis: Anterior cervical wound hematoma possible CSF leak  Postoperative diagnosis: Anterior cervical wound hematoma  Procedure: Reexploration of anterior cervical wound for evacuation of cervical hematoma  Surgeon: Dominica Severin Jarek Longton  Anesthesia: Gen.  EBL: Middle  History of present illness: Patient is a 71 year old gentleman who underwent gone a three-level anterior cervical discectomy and fusion earlier today on the floor noted to have increased swelling in his neck was progressive difficulty swallowing. On exam had a large fluid collection underneath the skin. Intraoperatively small spinal fluid leak was appreciated so is unclear whether this represent hematoma or CSF leak however was growing rapidly and causing increasing swallowing difficulties are recommended reexploration for evacuation of hematoma/fluid collection and/or repair of CSF leak. I extensively went over the risks and benefits of that operation with the patient's family as well as perioperative course expectations of outcome alternatives of surgery and they understood and agreed to proceed 4.  Operative procedure: Patient brought into the or was to send general anesthesia positioned supine the neck in slight extension in 5 pounds of halter traction. Left side his neck was prepped and draped in routine sterile fashion his old incision was opened up immediately bright red blood came out under pressure this was all aspirated I identified some subcutaneous bleeders coagulated then I tracked down to the plating system there was a small amount of epidural bleeding around the graft site and from the bones and posterior laterally around the foramina so I copiously irrigated then escorted some more Surgifoam and and continually packed repacked and reexplored the wound. There was a small amount of oozing continue to come through the graft there was more clear than bloody so as possible that this did represent spinal fluid so I took  some of the DuraSeal green glue 1 spray to the laterally in the foramen at the lower 2 cervical levels C4-5 and C5-6. Meticulous hemostasis was maintained and I elected not to leave a drain because of the question that there might accident some CSF leak anteriorly to the spine and there was no further active bleeders although the vast majority of fluid in the neck was hematoma. I then closed primarily with interrupted Vicryl and a running 4 septic or Dermabond benzo and Steri-Strips and sterile dressings applied patient recovered in stable condition. At the end the case all needle counts sponge counts were correct.

## 2016-06-26 NOTE — Transfer of Care (Signed)
Immediate Anesthesia Transfer of Care Note  Patient: Jeffery Fernandez  Procedure(s) Performed: Procedure(s): ANTERIOR CERVICAL DECOMPRESSION/DISCECTOMY FUSION, INTERBODY PROSTESIS, PLATE, CERVICAL THREE CERVICAL FOUR, CERVICAL FOUR CERVICAL FIVE CERVICAL SIX (N/A)  Patient Location: PACU  Anesthesia Type:General  Level of Consciousness: awake and alert   Airway & Oxygen Therapy: Patient Spontanous Breathing and Patient connected to nasal cannula oxygen  Post-op Assessment: Report given to RN and Post -op Vital signs reviewed and stable  Post vital signs: Reviewed and stable  Last Vitals:  Vitals:   06/26/16 0647 06/26/16 1130  BP: (!) 197/82   Pulse:    Resp:    Temp:  (P) 37.1 C    Last Pain:  Vitals:   06/26/16 0642  TempSrc: Oral      Patients Stated Pain Goal: 2 (75/64/33 2951)  Complications: No apparent anesthesia complications

## 2016-06-26 NOTE — Progress Notes (Signed)
Pharmacy Antibiotic Note  Jeffery Fernandez is a 71 y.o. male admitted on 06/26/2016 for planned spinal surgery. Pharmacy has been consulted for Vancomycin dosing post-op. Confirmed with the RN that a drain is not in place.   The patient received a dose of Vancomycin pre-op at 0730 today.   Plan: 1. Vancomycin 1g IV x 1 dose at 1930 today 2. Pharmacy will sign off as no further doses expected at this time.   Weight: 211 lb 9.6 oz (96 kg)  Temp (24hrs), Avg:98.2 F (36.8 C), Min:97.7 F (36.5 C), Max:98.8 F (37.1 C)  No results for input(s): WBC, CREATININE, LATICACIDVEN, VANCOTROUGH, VANCOPEAK, VANCORANDOM, GENTTROUGH, GENTPEAK, GENTRANDOM, TOBRATROUGH, TOBRAPEAK, TOBRARND, AMIKACINPEAK, AMIKACINTROU, AMIKACIN in the last 168 hours.  Estimated Creatinine Clearance: 48.7 mL/min (A) (by C-G formula based on SCr of 1.57 mg/dL (H)).    Allergies  Allergen Reactions  . Penicillins Other (See Comments)    Has patient had a PCN reaction causing immediate rash, facial/tongue/throat swelling, SOB or lightheadedness with hypotension:unknown Has patient had a PCN reaction causing severe rash involving mucus membranes or skin necrosis: Unknown Has patient had a PCN reaction that required hospitalization: Unknown Has patient had a PCN reaction occurring within the last 10 years: No Childhood reaction If all of the above answers are "NO", then may proceed with Cephalosporin use.    Thank you for allowing pharmacy to be a part of this patient's care.  Lawson Radar 06/26/2016 1:25 PM

## 2016-06-27 ENCOUNTER — Encounter (HOSPITAL_COMMUNITY): Payer: Self-pay | Admitting: Neurosurgery

## 2016-06-27 LAB — GLUCOSE, CAPILLARY
GLUCOSE-CAPILLARY: 204 mg/dL — AB (ref 65–99)
GLUCOSE-CAPILLARY: 208 mg/dL — AB (ref 65–99)
GLUCOSE-CAPILLARY: 294 mg/dL — AB (ref 65–99)
GLUCOSE-CAPILLARY: 90 mg/dL (ref 65–99)
Glucose-Capillary: 296 mg/dL — ABNORMAL HIGH (ref 65–99)
Glucose-Capillary: 157 mg/dL — ABNORMAL HIGH (ref 65–99)

## 2016-06-27 LAB — HEMOGLOBIN A1C
HEMOGLOBIN A1C: 7 % — AB (ref 4.8–5.6)
Mean Plasma Glucose: 154 mg/dL

## 2016-06-27 MED ORDER — INSULIN ASPART 100 UNIT/ML ~~LOC~~ SOLN
0.0000 [IU] | Freq: Three times a day (TID) | SUBCUTANEOUS | Status: DC
  Administered 2016-06-27: 4 [IU] via SUBCUTANEOUS
  Administered 2016-06-27: 11 [IU] via SUBCUTANEOUS
  Administered 2016-06-27: 7 [IU] via SUBCUTANEOUS

## 2016-06-27 MED ORDER — INSULIN ASPART 100 UNIT/ML ~~LOC~~ SOLN: 0.0000 [IU] | Freq: Every day | SUBCUTANEOUS | Status: DC

## 2016-06-27 MED FILL — Thrombin For Soln 5000 Unit: CUTANEOUS | Qty: 5000 | Status: AC

## 2016-06-27 NOTE — Progress Notes (Signed)
Patient ID: Jeffery Fernandez, male   DOB: [birth year], 71 y.o.   MRN: [id number] Subjective:  The patient is alert and pleasant. He is in no apparent distress. He still complains of dysphagia but it's much better since his evacuation of the hematoma.  Objective: Vital signs in last 24 hours: Temp:  [97.7 F (36.5 C)-98.8 F (37.1 C)] 97.9 F (36.6 C) (05/31 1158) Pulse Rate:  [69-94] 69 (05/31 1158) Resp:  [14-20] 18 (05/31 1158) BP: (148-173)/(71-95) 148/71 (05/31 1158) SpO2:  [91 %-100 %] 95 % (05/31 1158)  Intake/Output from previous day: 05/30 0701 - 05/31 0700 In: 2200 [I.V.:2200] Out: 200 [Blood:200] Intake/Output this shift: No intake/output data recorded.  Physical exam the patient is alert and pleasant. His strength is normal in all 4 extremities. His dressing is clean and dry. There is some swelling without midline shift. I don't palpate a obvious hematoma.  Lab Results: No results for input(s): WBC, HGB, HCT, PLT in the last 72 hours. BMET No results for input(s): NA, K, CL, CO2, GLUCOSE, BUN, CREATININE, CALCIUM in the last 72 hours.  Studies/Results: Dg Cervical Spine 2-3 Views  Result Date: 06/26/2016 CLINICAL DATA:  Anterior cervical decompression and discectomy and fusion intraoperative radiographs. EXAM: CERVICAL SPINE - 2-3 VIEW COMPARISON:  Cervical spine MRI of April 28, 2016 FINDINGS: The image labeled #1 reveals a metallic needle projecting along the anterior aspect of the C2-3 disc space. The image labeled #2 reveals the metallic needle projecting along the anterior 8 mm of the C3-4 disc space. The image labeled #3 reveals an anterior fusion plate extending from C3 through C6 with intradiscal devices at C3-4, C4-5, and C5-6. Surgical sponges are present in the prevertebral soft tissues. IMPRESSION: Lateral intraoperative radiographs with findings as described above. Electronically Signed   By: David  Martinique M.D.   On: 06/26/2016 11:21    Assessment/Plan: Postop day  #1: The patient is improving. We will observe him overnight. He will likely go home tomorrow.  LOS: 1 day     Bodi Palmeri D 06/27/2016, 12:10 PM

## 2016-06-27 NOTE — Evaluation (Signed)
Occupational Therapy Evaluation and Discharge Patient Details Name: Jeffery Fernandez MRN: [id number] DOB: 05-26-1945 Today's Date: 06/27/2016    History of Present Illness Pt is a 71 y/o male who presents s/p C3-C6 ACDF on 06/26/16. After surgery, pt with progressive difficulty swallowing and    Clinical Impression   This 71 yo male admitted and underwent above presents to acute OT with all OT education completed and post op cervical handout provided, we will D/C from acute OT.    Follow Up Recommendations  No OT follow up;Supervision - Intermittent    Equipment Recommendations  3 in 1 bedside commode       Precautions / Restrictions Precautions Precautions: Cervical Required Braces or Orthoses: Cervical Brace Cervical Brace: Hard collar (off for showering and eating) Restrictions Weight Bearing Restrictions: No      Mobility Bed Mobility Overal bed mobility: Needs Assistance Bed Mobility: Rolling;Sidelying to Sit Rolling: Supervision Sidelying to sit: Min assist          Transfers   Equipment used: None                  Balance Overall balance assessment: Needs assistance Sitting-balance support: No upper extremity supported;Feet supported Sitting balance-Leahy Scale: Good     Standing balance support: No upper extremity supported Standing balance-Leahy Scale: Fair                             ADL either performed or assessed with clinical judgement   ADL Overall ADL's : Needs assistance/impaired Eating/Feeding: Independent;Sitting   Grooming: Supervision/safety;Set up;Standing   Upper Body Bathing: Supervision/ safety;Set up;Sitting   Lower Body Bathing: Supervison/ safety;Set up;Sit to/from stand   Upper Body Dressing : Supervision/safety;Set up;Sitting   Lower Body Dressing: Supervision/safety;Set up;Sit to/from stand   Toilet Transfer: Supervision/safety;Ambulation (none)   Toileting- Clothing Manipulation and Hygiene:  Independent;Sit to/from stand         General ADL Comments: Educated pt and dtr on use of cups for oral care, how to change out and care for pads of cervical collar     Vision Patient Visual Report: No change from baseline              Pertinent Vitals/Pain Pain Assessment: 0-10 Pain Score: 3  Pain Location: neck Pain Descriptors / Indicators: Aching;Sore Pain Intervention(s): Limited activity within patient's tolerance;Monitored during session     Hand Dominance Right   Extremity/Trunk Assessment Upper Extremity Assessment Upper Extremity Assessment: RUE deficits/detail;LUE deficits/detail RUE Deficits / Details: mild weakness noted with shoulder up to 90 degrees flexion, weak in biceps LUE Deficits / Details: mild weakness noted with shoulder up to 90 degrees flexion, weak in biceps           Communication Communication Communication: No difficulties   Cognition Arousal/Alertness: Awake/alert Behavior During Therapy: WFL for tasks assessed/performed Overall Cognitive Status: Within Functional Limits for tasks assessed                                                Home Living Family/patient expects to be discharged to:: Private residence Living Arrangements: Spouse/significant other Available Help at Discharge: Family;Available 24 hours/day Type of Home: House Home Access: Stairs to enter [employer] of Steps: 4 Entrance Stairs-Rails: Right;Left;Can reach both Home Layout: One level     Bathroom  Shower/Tub: Walk-in Hydrologist: Standard     Home Equipment: Hand held shower head          Prior Functioning/Environment Level of Independence: Independent                 OT Problem List: Decreased strength;Pain         OT Goals(Current goals can be found in the care plan section) Acute Rehab OT Goals Patient Stated Goal: home today  OT Frequency:                AM-PAC PT "6 Clicks"  Daily Activity     Outcome Measure Help from another person eating meals?: None Help from another person taking care of personal grooming?: None Help from another person toileting, which includes using toliet, bedpan, or urinal?: None Help from another person bathing (including washing, rinsing, drying)?: None Help from another person to put on and taking off regular upper body clothing?: None Help from another person to put on and taking off regular lower body clothing?: None 6 Click Score: 24   End of Session Equipment Utilized During Treatment: Cervical collar  Activity Tolerance: Patient tolerated treatment well Patient left: in chair;with call bell/phone within reach;with family/visitor present  OT Visit Diagnosis: Pain Pain - part of body:  (neck)                Time: 0375-4360 OT Time Calculation (min): 28 min Charges:  OT General Charges $OT Visit: 1 Procedure OT Evaluation $OT Eval Low Complexity: 1 Procedure OT Treatments $Self Care/Home Management : 8-22 mins Golden Circle, OTR/L [phone] 06/27/2016

## 2016-06-27 NOTE — Evaluation (Addendum)
Physical Therapy Evaluation and Discharge Patient Details Name: Jeffery Fernandez MRN: [id number] DOB: [birth year] Today's Date: 06/27/2016   History of Present Illness  Pt is a 71 y/o male who presents s/p C3-C6 ACDF on 06/26/16. After surgery, pt with progressive difficulty swallowing and swelling, and returned to surgery for evacuation of hematoma.  Clinical Impression  Patient evaluated by Physical Therapy with no further acute PT needs identified. All education has been completed and the patient has no further questions. At the time of PT eval pt was able to perform transfers and ambulation with modified independence. Pt was educated on walking program, car transfer, and general safety with precautions. See below for any follow-up Physical Therapy or equipment needs. PT is signing off. Thank you for this referral.      Follow Up Recommendations No PT follow up;Supervision - Intermittent    Equipment Recommendations  None recommended by PT    Recommendations for Other Services       Precautions / Restrictions Precautions Precautions: Cervical Required Braces or Orthoses: Cervical Brace Cervical Brace: Hard collar (off for showering and eating) Restrictions Weight Bearing Restrictions: No      Mobility  Bed Mobility Overal bed mobility: Needs Assistance Bed Mobility: Rolling;Sidelying to Sit Rolling: Supervision Sidelying to sit: Min assist       General bed mobility comments: Pt was received sitting up in recliner chair.   Transfers Overall transfer level: Modified independent Equipment used: None             General transfer comment: No assist required, no unsteadiness noted.   Ambulation/Gait Ambulation/Gait assistance: Modified independent (Device/Increase time) Ambulation Distance (Feet): 400 Feet Assistive device: None Gait Pattern/deviations: WFL(Within Functional Limits) Gait velocity: Reported baseline Gait velocity interpretation: at or above normal speed  for age/gender General Gait Details: VC's for improved posture. Pt was ambulating at his reported baseline speed and no unsteadiness or LOB was noted.   Stairs Stairs: Yes Stairs assistance: Supervision Stair Management: No rails;One rail Right;Alternating pattern;Forwards Number of Stairs: 10 General stair comments: Ascended without rails, descended with rails per PT request. No unsteadiness noted. Mild cues for counting steps due to inability to see bottom step.   Wheelchair Mobility    Modified Rankin (Stroke Patients Only)       Balance Overall balance assessment: Needs assistance Sitting-balance support: No upper extremity supported;Feet supported Sitting balance-Leahy Scale: Good     Standing balance support: No upper extremity supported Standing balance-Leahy Scale: Fair                               Pertinent Vitals/Pain Pain Assessment: 0-10 Pain Score: 2  Pain Location: neck Pain Descriptors / Indicators: Aching;Sore Pain Intervention(s): Limited activity within patient's tolerance;Monitored during session;Repositioned    Home Living Family/patient expects to be discharged to:: Private residence Living Arrangements: Spouse/significant other Available Help at Discharge: Family;Available 24 hours/day Type of Home: House Home Access: Stairs to enter Entrance Stairs-Rails: Right;Left;Can reach both Entrance Stairs-Number of Steps: 4 Home Layout: One level Home Equipment: Hand held shower head      Prior Function Level of Independence: Independent               Hand Dominance   Dominant Hand: Right    Extremity/Trunk Assessment   Upper Extremity Assessment Upper Extremity Assessment: Defer to OT evaluation RUE Deficits / Details: mild weakness noted with shoulder up to 90 degrees flexion, weak in  biceps LUE Deficits / Details: mild weakness noted with shoulder up to 90 degrees flexion, weak in biceps    Lower Extremity  Assessment Lower Extremity Assessment: Overall WFL for tasks assessed    Cervical / Trunk Assessment Cervical / Trunk Assessment: Other exceptions Cervical / Trunk Exceptions: s/p surgery  Communication   Communication: No difficulties  Cognition Arousal/Alertness: Awake/alert Behavior During Therapy: WFL for tasks assessed/performed Overall Cognitive Status: Within Functional Limits for tasks assessed                                        General Comments      Exercises     Assessment/Plan    PT Assessment Patent does not need any further PT services  PT Problem List         PT Treatment Interventions      PT Goals (Current goals can be found in the Care Plan section)  Acute Rehab PT Goals Patient Stated Goal: home today PT Goal Formulation: With patient Time For Goal Achievement: 07/04/16 Potential to Achieve Goals: Good    Frequency     Barriers to discharge        Co-evaluation               AM-PAC PT "6 Clicks" Daily Activity  Outcome Measure Difficulty turning over in bed (including adjusting bedclothes, sheets and blankets)?: None Difficulty moving from lying on back to sitting on the side of the bed? : None Difficulty sitting down on and standing up from a chair with arms (e.g., wheelchair, bedside commode, etc,.)?: None Help needed moving to and from a bed to chair (including a wheelchair)?: None Help needed walking in hospital room?: None Help needed climbing 3-5 steps with a railing? : None 6 Click Score: 24    End of Session Equipment Utilized During Treatment: Cervical collar Activity Tolerance: Patient tolerated treatment well;No increased pain Patient left: in chair;with call bell/phone within reach;with family/visitor present Nurse Communication: Mobility status PT Visit Diagnosis: Pain Pain - part of body:  (Neck)    Time: 0881-1031 PT Time Calculation (min) (ACUTE ONLY): 16 min   Charges:   PT Evaluation $PT  Eval Moderate Complexity: 1 Procedure     PT G Codes:        Rolinda Roan, PT, DPT Acute Rehabilitation Services Pager: [place] 06/27/2016, 11:19 AM

## 2016-06-27 NOTE — Progress Notes (Signed)
Orthopedic Tech Progress Note Patient Details:  Jeffery Fernandez September 24, 1945 [id number]  Ortho Devices Type of Ortho Device: Soft collar Ortho Device/Splint Location: neck Ortho Device/Splint Interventions: Application   [name] 06/27/2016, 3:22 PM

## 2016-06-28 ENCOUNTER — Encounter (HOSPITAL_COMMUNITY): Payer: Self-pay | Admitting: Neurosurgery

## 2016-06-28 MED ORDER — DOCUSATE SODIUM 100 MG PO CAPS: 100.0000 mg | ORAL_CAPSULE | Freq: Two times a day (BID) | ORAL | 0 refills | Status: DC

## 2016-06-28 MED ORDER — CYCLOBENZAPRINE HCL 10 MG PO TABS: 10.0000 mg | ORAL_TABLET | Freq: Three times a day (TID) | ORAL | 1 refills | Status: DC | PRN

## 2016-06-28 MED ORDER — PANTOPRAZOLE SODIUM 40 MG PO TBEC: 40.0000 mg | DELAYED_RELEASE_TABLET | Freq: Every day | ORAL | Status: DC

## 2016-06-28 MED ORDER — OXYCODONE HCL 5 MG PO TABS: 5.0000 mg | ORAL_TABLET | ORAL | 0 refills | Status: DC | PRN

## 2016-06-28 NOTE — Progress Notes (Signed)
Patient alert and oriented, mae's well, voiding adequate amount of urine, swallowing without difficulty, no c/o pain at time of discharge. Patient discharged home with family. Script and discharged instructions given to patient. Patient and family stated understanding of instructions given. Patient has an appointment with Dr. Jenkins   

## 2016-06-28 NOTE — OR Nursing (Signed)
Late entry due to delay code documentation. 

## 2016-06-28 NOTE — Discharge Summary (Signed)
Physician Discharge Summary  Patient ID: Jeffery Fernandez MRN: [id number] DOB/AGE: 10/14/1945 71 y.o.  Admit date: 06/26/2016 Discharge date: 06/28/2016  Admission Diagnoses:C3-4, C4-5 and C5-6 disc degeneration, spondylosis, stenosis, cervicalgia, cervical radiculopathy, cervical myelopathy  Discharge Diagnoses: The same and cervical hematoma Active Problems:   Cervical spondylosis with myelopathy and radiculopathy   Spinal stenosis of cervical region   Discharged Condition: good  Hospital Course: I performed a C3-4, C4-5 and C5-6 anterior cervical discectomy, fusion, and plating on the patient on 06/26/2016. On the evening of surgery the patient developed a postoperative hematoma. Dr. Saintclair Halsted evacuated this.  The patient did well after surgeries. He had some hyperglycemia attributed to his Decadron treatment. The patient's dysphagia has largely resolved.  On 06/28/2016 the patient and his daughter requested discharge to home. They were given written and oral discharge instructions. All their questions were answered.  Consults: None Significant Diagnostic Studies: None Treatments: C3-4, C4-5 and C5-6 intervertebral discectomy, fusion, plating; evacuation of cervical hematoma Discharge Exam: Blood pressure (!) 158/72, pulse 65, temperature 98 F (36.7 C), temperature source Oral, resp. rate 18, height 5\' 10"  (1.778 m), weight 95.7 kg (211 lb), SpO2 95 %. The patient is alert and pleasant. He looks well. He still has some moderate swelling in his left neck. There is no midline shift. There is no stridor. His speech is unremarkable. His strength is normal in all 4 extremities.  Disposition: Home  Discharge Instructions    Call MD for:  difficulty breathing, headache or visual disturbances    Complete by:  As directed    Call MD for:  extreme fatigue    Complete by:  As directed    Call MD for:  hives    Complete by:  As directed    Call MD for:  persistant dizziness or light-headedness     Complete by:  As directed    Call MD for:  persistant nausea and vomiting    Complete by:  As directed    Call MD for:  redness, tenderness, or signs of infection (pain, swelling, redness, odor or green/yellow discharge around incision site)    Complete by:  As directed    Call MD for:  severe uncontrolled pain    Complete by:  As directed    Call MD for:  temperature >100.4    Complete by:  As directed    Diet - low sodium heart healthy    Complete by:  As directed    Discharge instructions    Complete by:  As directed    Call [phone] for a followup appointment. Take a stool softener while you are using pain medications.   Driving Restrictions    Complete by:  As directed    Do not drive for 2 weeks.   Increase activity slowly    Complete by:  As directed    Lifting restrictions    Complete by:  As directed    Do not lift more than 5 pounds. No excessive bending or twisting.   May shower / Bathe    Complete by:  As directed    He may shower after the pain she is removed 3 days after surgery. Leave the incision alone.   Remove dressing in 24 hours    Complete by:  As directed      Allergies as of 06/28/2016      Reactions   Penicillins Other (See Comments)   Has patient had a PCN reaction causing immediate rash, facial/tongue/throat  swelling, SOB or lightheadedness with hypotension:unknown Has patient had a PCN reaction causing severe rash involving mucus membranes or skin necrosis: Unknown Has patient had a PCN reaction that required hospitalization: Unknown Has patient had a PCN reaction occurring within the last 10 years: No Childhood reaction If all of the above answers are "NO", then may proceed with Cephalosporin use.      Medication List    STOP taking these medications   aspirin EC 81 MG tablet     TAKE these medications   acetaminophen 500 MG tablet Commonly known as:  TYLENOL Take 1,000 mg by mouth every 6 (six) hours as needed (for pain.).    atorvastatin 40 MG tablet Commonly known as:  LIPITOR Take 20 mg by mouth every evening.   carvedilol 25 MG tablet Commonly known as:  COREG Take 1 tablet (25 mg total) by mouth 2 (two) times daily with a meal.   clopidogrel 75 MG tablet Commonly known as:  PLAVIX Take 75 mg by mouth daily.   cyclobenzaprine 10 MG tablet Commonly known as:  FLEXERIL Take 1 tablet (10 mg total) by mouth 3 (three) times daily as needed for muscle spasms.   docusate sodium 100 MG capsule Commonly known as:  COLACE Take 1 capsule (100 mg total) by mouth 2 (two) times daily.   glipiZIDE 10 MG 24 hr tablet Commonly known as:  GLUCOTROL XL Take 10 mg by mouth 2 (two) times daily.   ITCHY EYE DROPS OP Place 1 drop into both eyes daily as needed (for itchy eyes.).   JANUVIA 100 MG tablet Generic drug:  sitaGLIPtin Take 100 mg by mouth daily.   losartan-hydrochlorothiazide 100-25 MG tablet Commonly known as:  HYZAAR Take 1 tablet by mouth daily.   metFORMIN 500 MG tablet Commonly known as:  GLUCOPHAGE Take 500 mg by mouth 2 (two) times daily with a meal.   oxyCODONE 5 MG immediate release tablet Commonly known as:  Oxy IR/ROXICODONE Take 1-2 tablets (5-10 mg total) by mouth every 4 (four) hours as needed for moderate pain or breakthrough pain.   pantoprazole 40 MG tablet Commonly known as:  PROTONIX Take by mouth.   sertraline 50 MG tablet Commonly known as:  ZOLOFT Take 50 mg by mouth daily.   terazosin 2 MG capsule Commonly known as:  HYTRIN Take 2 mg by mouth at bedtime.        SignedNewman Pies D 06/28/2016, 7:10 AM

## 2016-07-03 ENCOUNTER — Ambulatory Visit (INDEPENDENT_AMBULATORY_CARE_PROVIDER_SITE_OTHER): Payer: Medicare Other | Admitting: Neurology

## 2016-07-03 ENCOUNTER — Encounter: Payer: Self-pay | Admitting: Neurology

## 2016-07-03 VITALS — BP 117/72 | HR 77 | Ht 69.5 in | Wt 202.0 lb

## 2016-07-03 DIAGNOSIS — M4802 Spinal stenosis, cervical region: Secondary | ICD-10-CM

## 2016-07-03 DIAGNOSIS — I639 Cerebral infarction, unspecified: Secondary | ICD-10-CM

## 2016-07-03 NOTE — Patient Instructions (Signed)
Remember to drink plenty of fluid, eat healthy meals and do not skip any meals. Try to eat protein with a every meal and eat a healthy snack such as fruit or nuts in between meals. Try to keep a regular sleep-wake schedule and try to exercise daily, particularly in the form of walking, 20-30 minutes a day, if you can.   As far as your medications are concerned, I would like to suggest: Stop ASA and Plavix, start daily ASA 325mg   I would like to see you back as needed, sooner if we need to. Please call us with any interim questions, concerns, problems, updates or refill requests.    Our phone number is [phone]. We also have an after hours call service for urgent matters and there is a physician on-call for urgent questions. For any emergencies you know to call 911 or go to the nearest emergency room

## 2016-07-03 NOTE — Progress Notes (Signed)
[facility]    Provider:  Dr Jaynee Eagles Referring Provider: Dione Housekeeper, MD Primary Care Physician:  Dione Housekeeper, MD  CC:  Arm weakness and headaches  Interval history 07/03/2016: Patient is here for follow-up of right arm weakness due to moderate to severe cervical stenosis with myelopathic signal in the spinal cord and possible right C5 and C6 nerve root compression. He is here status post surgery by Dr. Arnoldo Morale.He had a C3-C4, C4-C5 and C5-C6 anterior cervical discectomy, fusion and plating with a postoperative hematoma which was evacuated. Here with his wife. He is already feeling better. He is on Plavix. His primary doctor started him on it because they thought he had a stroke. He has been on Plavix for 6-7 years.  They deny any Strokes or TIAs. I advised them that I can't give them a reason Dr. Edrick Oh prescribed it and they may have to talk to him and see why. I don't have any reason to keep him on Plavix and ASA 325mg  may be sufficient but that is my opinion on limited information and discussed this with them both. Advised to also call the doctor who put them on the Plavix and ASA to be sure there is no indication.   HPI:  Jeffery Fernandez is a 71 y.o.right-handed  male here as a referral from Dr. Edrick Oh for arm weakness and headaches. PMHx OSA, COPD, CAD, HTN, DM on CPAP. Here with wife and daughter. He has been married for 52 years. The arm weakness started 3-4 months and worsening, getting more noticeable. He noticed it when he was picking up something a few months ago, didn;t have the strength. Patient says it is about the same but family thinks it progressing worsening. Wife and daughter provide much information. Wife says he avoids using the arm. Daughter noticed recently he had brought in a roast and when he tried to put it on the counter he had to use his whole body, favoring the left arm. No pain, just no strength. He has neck pain on the left side. He is also having  headaches, they are all over around the back of the head and the top of the head, 3x a week and they gradually go away on their own he has neck pain and radiation up the left of his neck. 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 He endorses using the cpap "sometimes". His sleep is disturbed lately he is worrying a lot. He had an eye exam with improved vision, denies any sensory changes in the right arm except sore left wrist joint maybe. Daughter has noticed the right leg weakness and stumbling due to the right. His balance may be "off", he has noticed he has a little bit of slurred speech (family has not noticed), no dysphagia, no facial weakness or facial droop, no significant weight loss, no abnormal muscle movements. He has a brother with Parkinson's disease. No other focal neurologic deficits, associated symptoms, inciting events or modifiable factors.     Reviewed notes, labs and imaging from outside physicians, which showed:  hgba1c 7.1, total cholesterol 114   Reviewed primary care notes. Patient has right arm weakness has been ongoing for several months. He notices it when he picks things up. He is weak in the right side. He's had some headaches on and off but nothing acute. Says his arm gradually has been weaker. The family has noticed he is having difficulty lifting things. Has not had any speech involvement or intermittent  neck pain. Has not been tripping or falling. Exam noted right upper extremity weakness.  Was seen in the ED 11/12/2014 with headaches in the setting of uncontrolled BP. Otherwise previous notes by Pulmonology for OSA on cpap last note documented "Based on download, he has hardly worn his mask in months because he says it hurts his face. He has not called his DME company about it. Admits he snores more and feels less rested without CPAP."  Personally reviewed images CT head 03/29/2016 and agree with the following:  Brain: Moderate atrophy. Extensive white matter  hypodensities bilaterally which appears chronic.  Negative for acute infarct. Negative for hemorrhage or mass or fluid collection. No shift of the midline structures.  Vascular: No hyperdense vessel or unexpected calcification.  Skull: Negative  Sinuses/Orbits: Negative  Other: None  IMPRESSION: Atrophy and microvascular ischemia diffusely which appears chronic.No acute abnormality  Review of Systems: Patient complains of symptoms per HPI as well as the following symptoms: No CP, no SOB: . Pertinent negatives per HPI. All others negative.    Social History   Social History  . Marital status: Married    Spouse name: Lelon Frohlich  . Number of children: 1  . Years of education: N/A   Occupational History  . 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   Social History Main Topics  . Smoking status: Former Smoker    Packs/day: 1.50    Years: 25.00    Types: Cigarettes    Quit date: 01/28/1986  . Smokeless tobacco: Former Systems developer  . Alcohol use No     Comment: Very rare  . Drug use: No  . Sexual activity: Not on file     Comment: Married   Other Topics Concern  . 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 Larena Glassman from [place] and moved back there after retirement. He  Has a daughter living in [place]. Quit smoking around 1988. Married   Right-handed   Caffeine: soda daily, decaf coffee    Family History  Problem Relation Age of Onset  . Heart attack Brother 60       heart attack and CHF/ also some form of EP ablation  . Parkinson's disease Brother   . Atrial fibrillation Brother   . Heart disease Father   . Stroke Father   . Uterine cancer Mother   . Arthritis/Rheumatoid Cousin   . Diabetes Other   . Colon cancer Neg Hx   . Stomach cancer Neg Hx   . Liver cancer Neg Hx   . Rectal cancer Neg Hx   . Esophageal cancer Neg Hx     Past Medical History:  Diagnosis Date  .  Arrhythmia   . Arthritis   . Chest pain, unspecified   . Chronic airway obstruction, not elsewhere classified    PT. DENIES HE HAS COPD  . CKD (chronic kidney disease)   . Colon polyps    Adenomatous Polyps 2007  . Depression   . Diabetes mellitus   . Diverticulosis   . Gout   . Heart murmur   . Hyperlipemia   . Hypertension   . Nephrolithiasis   . Obstructive sleep apnea (adult) (pediatric)   . Other and unspecified hyperlipidemia   . Other left bundle branch block   . Retinal vascular occlusion, unspecified    right  . Shortness of breath  on exertion  . Unspecified sleep apnea   . Vision loss of right eye     Past Surgical History:  Procedure Laterality Date  . ANTERIOR CERVICAL DECOMP/DISCECTOMY FUSION N/A 06/26/2016   Procedure: Re-Exploration of Cervical Wound;  Surgeon: Kary Kos, MD;  Location: [facility];  Service: Neurosurgery;  Laterality: N/A;  . ANTERIOR CERVICAL DECOMP/DISCECTOMY FUSION N/A 06/26/2016   Procedure: ANTERIOR CERVICAL DECOMPRESSION/DISCECTOMY FUSION, INTERBODY PROSTESIS, PLATE, CERVICAL THREE CERVICAL FOUR, CERVICAL FOUR CERVICAL FIVE CERVICAL SIX;  Surgeon: Newman Pies, MD;  Location: [facility];  Service: Neurosurgery;  Laterality: N/A;  . CATARACT EXTRACTION Bilateral   . KIDNEY STONE SURGERY      Current Outpatient Prescriptions  Medication Sig Dispense Refill  . acetaminophen (TYLENOL) 500 MG tablet Take 1,000 mg by mouth every 6 (six) hours as needed (for pain.).    Marland Kitchen atorvastatin (LIPITOR) 40 MG tablet Take 20 mg by mouth every evening.    . carvedilol (COREG) 25 MG tablet Take 1 tablet (25 mg total) by mouth 2 (two) times daily with a meal. 60 tablet 5  . clopidogrel (PLAVIX) 75 MG tablet Take 75 mg by mouth daily.      Marland Kitchen glipiZIDE (GLUCOTROL XL) 10 MG 24 hr tablet Take 10 mg by mouth 2 (two) times daily.    Marland Kitchen Ketotifen Fumarate (ITCHY EYE DROPS OP) Place 1 drop into both eyes daily as needed (for itchy eyes.).    Marland Kitchen  losartan-hydrochlorothiazide (HYZAAR) 100-25 MG tablet Take 1 tablet by mouth daily.     . metFORMIN (GLUCOPHAGE) 500 MG tablet Take 500 mg by mouth 2 (two) times daily with a meal.      . pantoprazole (PROTONIX) 40 MG tablet Take by mouth.    . sertraline (ZOLOFT) 50 MG tablet Take 50 mg by mouth daily.      . sitaGLIPtan (JANUVIA) 100 MG tablet Take 100 mg by mouth daily.      Marland Kitchen terazosin (HYTRIN) 2 MG capsule Take 2 mg by mouth at bedtime.       No current facility-administered medications for this visit.     Allergies as of 07/03/2016 - Review Complete 07/03/2016  Allergen Reaction Noted  . Penicillins Other (See Comments) 06/08/2010    Vitals: BP 117/72   Pulse 77   Ht 5' 9.5" (1.765 m)   Wt 202 lb (91.6 kg)   BMI 29.40 kg/m  Last Weight:  Wt Readings from Last 1 Encounters:  07/03/16 202 lb (91.6 kg)   Last Height:   Ht Readings from Last 1 Encounters:  07/03/16 5' 9.5" (1.765 m)    Physical exam: Exam: Gen: NAD               Eyes: Conjunctivae clear without exudates or hemorrhage  Neuro: Detailed Neurologic Exam  Speech:    Speech is normal; fluent and spontaneous with normal comprehension.  Cognition:    The patient is oriented to person, place, and time;  Cranial Nerves:    The pupils are equal, round, and reactive to light. Visual fields are full to finger confrontation. Extraocular movements are intact. Trigeminal sensation is intact and the muscles of mastication are normal. The face is symmetric. The palate elevates in the midline. Hearing intact. Voice is normal. Shoulder shrug is normal. The tongue has normal motion without fasciculations.   Motor Observation:    No asymmetry, no atrophy, and no involuntary movements noted. Tone:    Normal muscle tone.    Posture:  Posture is normal. normal erect    Strength:    Upper extremity weakness secondary to pain and cervical collar     Sensation: intact to LT       Assessment/Plan:  Patient is  here for follow-up of right arm pain and weakness diagnosed with cervical spondylosis with myelopathy and radiculopathy as well as spinal stenosis of the cervical region status post surgery on 06/28/2016. He had a C3-C4, C4-C5 and C5-C6 anterior cervical discectomy, fusion and plating with a postoperative hematoma which was evacuated. Feeling better as far stroke prevention, I think ASA 325mg  daily will be sufficient instead of dual anti-platelet.   Sarina Ill, MD  [facility] [address] [place], [place] [address]  Phone [phone] Fax [phone]  A total of 15 minutes was spent face-to-face with this patient. Over half this time was spent on counseling patient on the cervical stenosis diagnosis and different diagnostic and therapeutic options available.

## 2016-07-05 ENCOUNTER — Emergency Department (HOSPITAL_COMMUNITY)
Admission: EM | Admit: 2016-07-05 | Discharge: 2016-07-05 | Disposition: A | Discharge status: Home / Self Care | Payer: Medicare Other | Attending: Emergency Medicine | Admitting: Emergency Medicine

## 2016-07-05 ENCOUNTER — Encounter (HOSPITAL_COMMUNITY): Payer: Self-pay | Admitting: *Deleted

## 2016-07-05 DIAGNOSIS — G8918 Other acute postprocedural pain: Secondary | ICD-10-CM | POA: Diagnosis not present

## 2016-07-05 DIAGNOSIS — Z79899 Other long term (current) drug therapy: Secondary | ICD-10-CM | POA: Diagnosis not present

## 2016-07-05 DIAGNOSIS — I129 Hypertensive chronic kidney disease with stage 1 through stage 4 chronic kidney disease, or unspecified chronic kidney disease: Secondary | ICD-10-CM | POA: Insufficient documentation

## 2016-07-05 DIAGNOSIS — I2510 Atherosclerotic heart disease of native coronary artery without angina pectoris: Secondary | ICD-10-CM | POA: Insufficient documentation

## 2016-07-05 DIAGNOSIS — E1122 Type 2 diabetes mellitus with diabetic chronic kidney disease: Secondary | ICD-10-CM | POA: Insufficient documentation

## 2016-07-05 DIAGNOSIS — Z87891 Personal history of nicotine dependence: Secondary | ICD-10-CM | POA: Insufficient documentation

## 2016-07-05 DIAGNOSIS — N189 Chronic kidney disease, unspecified: Secondary | ICD-10-CM | POA: Diagnosis not present

## 2016-07-05 DIAGNOSIS — J449 Chronic obstructive pulmonary disease, unspecified: Secondary | ICD-10-CM | POA: Diagnosis not present

## 2016-07-05 DIAGNOSIS — Z7984 Long term (current) use of oral hypoglycemic drugs: Secondary | ICD-10-CM | POA: Insufficient documentation

## 2016-07-05 DIAGNOSIS — M542 Cervicalgia: Secondary | ICD-10-CM | POA: Insufficient documentation

## 2016-07-05 MED ORDER — DOXYCYCLINE HYCLATE 100 MG PO CAPS: 100.0000 mg | ORAL_CAPSULE | Freq: Two times a day (BID) | ORAL | 0 refills | Status: DC

## 2016-07-05 MED ORDER — IOPAMIDOL (ISOVUE-300) INJECTION 61%
INTRAVENOUS | Status: AC
  Filled 2016-07-05: qty 75

## 2016-07-05 NOTE — ED Provider Notes (Signed)
[facility] DEPT Provider Note   CSN: [id number] Arrival date & time: 07/05/16  1620     History   Chief Complaint Chief Complaint  Patient presents with  . Neck Pain  . Post-op Problem    HPI Jeffery Fernandez is a 71 y.o. male.  HPI Patient presents to the emergency department with swelling to the surgical site in his left anterior neck.  Patient was actually at his neurosurgeon's office, but the on-call surgeon was at the hospital and they were directed to come to the emergency department for evaluation.  Patient has no complaints.  He states that the area is not very painful.The patient denies chest pain, shortness of breath, headache,blurred vision, neck pain, fever, cough, weakness, numbness, dizziness, anorexia, edema, abdominal pain, nausea, vomiting, diarrhea, rash, back pain, dysuria, hematemesis, bloody stool, near syncope, or syncope.  Patient states that he has had some difficulty swallowing but states that it seems to be improving with the today Past Medical History:  Diagnosis Date  . Arrhythmia   . Arthritis   . Chest pain, unspecified   . Chronic airway obstruction, not elsewhere classified    PT. DENIES HE HAS COPD  . CKD (chronic kidney disease)   . Colon polyps    Adenomatous Polyps 2007  . Depression   . Diabetes mellitus   . Diverticulosis   . Gout   . Heart murmur   . Hyperlipemia   . Hypertension   . Nephrolithiasis   . Obstructive sleep apnea (adult) (pediatric)   . Other and unspecified hyperlipidemia   . Other left bundle branch block   . Retinal vascular occlusion, unspecified    right  . Shortness of breath    on exertion  . Unspecified sleep apnea   . Vision loss of right eye     Patient Active Problem List   Diagnosis Date Noted  . Cervical spondylosis with myelopathy and radiculopathy 06/26/2016  . Spinal stenosis of cervical region 06/26/2016  . Personal history of colonic polyps 06/14/2011  . CAD (coronary artery disease)  07/10/2010  . OSA (obstructive sleep apnea) 06/10/2010  . Hypertension 06/10/2010  . COPD (chronic obstructive pulmonary disease) ([facility]) 06/10/2010  . Hyperlipidemia 06/10/2010  . Retinal artery occlusion 06/10/2010    Past Surgical History:  Procedure Laterality Date  . ANTERIOR CERVICAL DECOMP/DISCECTOMY FUSION N/A 06/26/2016   Procedure: Re-Exploration of Cervical Wound;  Surgeon: Kary Kos, MD;  Location: [facility];  Service: Neurosurgery;  Laterality: N/A;  . ANTERIOR CERVICAL DECOMP/DISCECTOMY FUSION N/A 06/26/2016   Procedure: ANTERIOR CERVICAL DECOMPRESSION/DISCECTOMY FUSION, INTERBODY PROSTESIS, PLATE, CERVICAL THREE CERVICAL FOUR, CERVICAL FOUR CERVICAL FIVE CERVICAL SIX;  Surgeon: Newman Pies, MD;  Location: [facility];  Service: Neurosurgery;  Laterality: N/A;  . CATARACT EXTRACTION Bilateral   . KIDNEY STONE SURGERY         Home Medications    Prior to Admission medications   Medication Sig Start Date End Date Taking? Authorizing Provider  acetaminophen (TYLENOL) 500 MG tablet Take 1,000 mg by mouth every 6 (six) hours as needed (for pain.).   Yes [provider]  atorvastatin (LIPITOR) 40 MG tablet Take 20 mg by mouth at bedtime.    Yes [provider]  carvedilol (COREG) 25 MG tablet Take 1 tablet (25 mg total) by mouth 2 (two) times daily with a meal. 06/20/16  Yes Skeet Latch, MD  glipiZIDE (GLUCOTROL XL) 10 MG 24 hr tablet Take 10 mg by mouth 2 (two) times daily.  Yes [provider]  Ketotifen Fumarate (ITCHY EYE DROPS OP) Place 1 drop into both eyes daily as needed (itching/allergies).    Yes [provider]  losartan-hydrochlorothiazide (HYZAAR) 100-25 MG tablet Take 1 tablet by mouth daily.  05/25/16  Yes [provider]  metFORMIN (GLUCOPHAGE) 500 MG tablet Take 1,000 mg by mouth 2 (two) times daily with a meal.    Yes [provider]  pantoprazole (PROTONIX) 40 MG tablet Take 40 mg by mouth daily.  03/14/16  03/14/17 Yes [provider]  sertraline (ZOLOFT) 50 MG tablet Take 50 mg by mouth daily.     Yes [provider]  sitaGLIPtan (JANUVIA) 100 MG tablet Take 100 mg by mouth daily.     Yes [provider]  terazosin (HYTRIN) 2 MG capsule Take 2 mg by mouth at bedtime.     Yes [provider]    Family History Family History  Problem Relation Age of Onset  . Heart attack Brother 60       heart attack and CHF/ also some form of EP ablation  . Parkinson's disease Brother   . Atrial fibrillation Brother   . Heart disease Father   . Stroke Father   . Uterine cancer Mother   . Arthritis/Rheumatoid Cousin   . Diabetes Other   . Colon cancer Neg Hx   . Stomach cancer Neg Hx   . Liver cancer Neg Hx   . Rectal cancer Neg Hx   . Esophageal cancer Neg Hx     Social History Social History  Substance Use Topics  . Smoking status: Former Smoker    Packs/day: 1.50    Years: 25.00    Types: Cigarettes    Quit date: 01/28/1986  . Smokeless tobacco: Former Systems developer  . Alcohol use No     Comment: Very rare     Allergies   Penicillins   Review of Systems Review of Systems All other systems negative except as documented in the HPI. All pertinent positives and negatives as reviewed in the HPI.   Physical Exam Updated Vital Signs BP (!) 160/75   Pulse 73   Temp 98 F (36.7 C) (Oral)   Resp 19   Ht 5\' 11"  (1.803 m)   Wt 92.5 kg (204 lb)   SpO2 92%   BMI 28.45 kg/m   Physical Exam  Constitutional: He is oriented to person, place, and time. He appears well-developed and well-nourished. No distress.  HENT:  Head: Normocephalic and atraumatic.  Eyes: Pupils are equal, round, and reactive to light.  Neck:    Cardiovascular: Normal rate and regular rhythm.  Exam reveals no friction rub.   No murmur heard. Pulmonary/Chest: Effort normal and breath sounds normal.  Neurological: He is alert and oriented to person, place, and time.  Skin: Skin is warm  and dry.  Psychiatric: He has a normal mood and affect.  Nursing note and vitals reviewed.    ED Treatments / Results  Labs (all labs ordered are listed, but only abnormal results are displayed) Labs Reviewed - No data to display  EKG  EKG Interpretation None       Radiology No results found.  Procedures Procedures (including critical care time)  Medications Ordered in ED Medications - No data to display   Initial Impression / Assessment and Plan / ED Course  I have reviewed the triage vital signs and the nursing notes.  Pertinent labs & imaging results that were available during my  care of the patient were reviewed by me and considered in my medical decision making (see chart for details).   Dr. Ellene Route and evaluated the patient and would like me to place patient on 100 mg of doxycycline.  The patient will be advised follow-up in their office next week.  Told to return to the emergency department as needed for any worsening in his condition  Final Clinical Impressions(s) / ED Diagnoses   Final diagnoses:  None    New Prescriptions New Prescriptions   No medications on file     Dalia Heading, Hershal Coria 07/07/16 [id number]    Pattricia Boss, MD 07/07/16 1559

## 2016-07-05 NOTE — ED Notes (Signed)
Report attempted 

## 2016-07-05 NOTE — ED Triage Notes (Signed)
Pt in c/o L lateral neck pain and hardening at incision site, per family pt had fusion of C4,5,6 x 9 days ago that required second surgery d/t hematoma at site, pt reports pain and swelling affecting ability to swallow, pt denies SOB, airway intact, Elsner, MD told pt to come here for eval, pt A&O x4

## 2016-07-05 NOTE — ED Notes (Signed)
Surgeon at the bedside.

## 2016-07-05 NOTE — Consult Note (Signed)
[name] is a 71 year old individual who had surgery for a 3 level anterior decompression at C3-4 C4-5 and C5-6 on the 30th. He had a postoperative hematoma which required surgical drainage. He tolerated that well and was discharged home. He has had increasing swelling in the left side of his neck. He's had some induration in his wife was advised to bring him to the office. I was in vault and emergency surgery at the time and unfortunately could not see him in the office and he was then advised from the office to come to the emergency department. On initial evaluation and at that his incision appears clean and dry. There is still glue over most of the incision though some of the lateral balloon is peeled away. There is some induration around the lateral borders but no significant redness. There is a mild amount of edema. The patient denies any respiratory difficulty he denies any difficulty with swallowing. I have advised that we can simply observe this process and should simmer down on its own. Because he does have a slight amount of increased edema in the last few days have suggested starting him on doxycycline 100 mg twice daily. He also notes that is hard cervical collar does not fit him well at all and he also has some abrasion on the undersurface of the chin where his chin has been resting in the collar. I advised the patient to use soft cervical collar instead of the hard cervical collar at this time. No radiographs were obtained today.

## 2016-07-05 NOTE — Discharge Instructions (Signed)
Return here as needed.  Follow-up with your surgeon next week. You can use some heat around the area as well.

## 2016-07-05 NOTE — ED Notes (Signed)
Pt in aspen collar upon arrival to ED

## 2016-07-05 NOTE — OR Nursing (Signed)
LATE ENTRY: Case end time documented. Verified with Designer, jewellery.

## 2016-07-08 ENCOUNTER — Encounter (HOSPITAL_COMMUNITY): Payer: Self-pay | Admitting: Neurosurgery

## 2016-07-08 NOTE — OR Nursing (Signed)
Addendum to record done per Bryna Colander RN

## 2016-07-24 ENCOUNTER — Ambulatory Visit (INDEPENDENT_AMBULATORY_CARE_PROVIDER_SITE_OTHER): Payer: Medicare Other | Admitting: Pharmacist

## 2016-07-24 VITALS — BP 132/68 | HR 64

## 2016-07-24 DIAGNOSIS — I639 Cerebral infarction, unspecified: Secondary | ICD-10-CM

## 2016-07-24 DIAGNOSIS — I10 Essential (primary) hypertension: Secondary | ICD-10-CM | POA: Diagnosis not present

## 2016-07-24 MED ORDER — TERAZOSIN HCL 2 MG PO CAPS: 4.0000 mg | ORAL_CAPSULE | Freq: Every day | ORAL | 1 refills | Status: DC

## 2016-07-24 NOTE — Assessment & Plan Note (Signed)
Blood pressure remains slightly above goal during office visit and poorly controlled at home too. Patient is currently on triple therapy with maximum daily dose of Hyzaar (losartan 100mg /HCTZ 25mg ) plus carvedilol 25mg  twice daily. He is tolerating current therapy without significant adverse reaction or problems.  Also noted patient is taking terazosin 2mg  every evening for BPH treatment.  Will increase his terazosin to 4mg  every evening for his antihypertensive properties and continue daily BP monitoring. Blood pressure to be measure every morning (2 hours after taking his morning medication) and every evening. Patient was instructed to keep log and bring to follow up in 3 weeks.

## 2016-07-24 NOTE — Progress Notes (Signed)
Patient ID: Jeffery Fernandez                 DOB: 1945-05-12                      MRN: [id number]      HPI: Jeffery Fernandez is a 71 y.o. male referred by Dr. Oval Linsey to HTN clinic.  PMH includes DM, COPD, sleep apnea, CKD, heart murmur, hyperlipidemia, hypertension, and retinal vascular occlusion.  During most recent cardiologist visit on 06/21/2015 his BP was poorly controlled and his metoprolol was changed to carvedilol 25mg  twice daily . Patient also had fusion and plating of C3-4, C4-5, and C5-6 on 06/16/2016. Has a  CPAP but reports only occasional use.  Patient presents today for hypertension follow up. Denies swelling, headaches, chest pain of fatigue. Report occasional dizziness upon standing but denies falls.  Current HTN meds:  Losartan/HTCZ 100-25mg  daily Carvedilol 25mg  twice daily  Previously tried:  Metoprolol 25mg  twice daily Amlodipine - face swelling  BP goal: 130/80  Family History: Atrial fibrillation in his brother; Diabetes in his other; Heart attack (age of onset: 93) in his brother; Heart disease in his father; Parkinson's disease in his brother; Stroke in his father; Uterine cancer in his mother.   Social History: Former smoker - quit ~30 years ago. He has quit using smokeless tobacco too. He reports that he does not drink alcohol or use drugs.   Diet:  mainly home cooked meals, no table salt added, occasional fast food, decaf coffee every morning  Exercise: daily walks, activities of daily life  Home BP readings: 15 readings; average 160/90  Wt Readings from Last 3 Encounters:  07/05/16 204 lb (92.5 kg)  07/03/16 202 lb (91.6 kg)  06/27/16 211 lb (95.7 kg)   BP Readings from Last 3 Encounters:  07/24/16 132/68  07/05/16 (!) 161/90  07/03/16 117/72   Pulse Readings from Last 3 Encounters:  07/24/16 64  07/05/16 74  07/03/16 77    Past Medical History:  Diagnosis Date  . Arrhythmia   . Arthritis   . Chest pain, unspecified   . Chronic airway obstruction,  not elsewhere classified    PT. DENIES HE HAS COPD  . CKD (chronic kidney disease)   . Colon polyps    Adenomatous Polyps 2007  . Depression   . Diabetes mellitus   . Diverticulosis   . Gout   . Heart murmur   . Hyperlipemia   . Hypertension   . Nephrolithiasis   . Obstructive sleep apnea (adult) (pediatric)   . Other and unspecified hyperlipidemia   . Other left bundle branch block   . Retinal vascular occlusion, unspecified    right  . Shortness of breath    on exertion  . Unspecified sleep apnea   . Vision loss of right eye     Current Outpatient Prescriptions on File Prior to Visit  Medication Sig Dispense Refill  . acetaminophen (TYLENOL) 500 MG tablet Take 1,000 mg by mouth every 6 (six) hours as needed (for pain.).    Marland Kitchen atorvastatin (LIPITOR) 40 MG tablet Take 20 mg by mouth at bedtime.     . carvedilol (COREG) 25 MG tablet Take 1 tablet (25 mg total) by mouth 2 (two) times daily with a meal. 60 tablet 5  . doxycycline (VIBRAMYCIN) 100 MG capsule Take 1 capsule (100 mg total) by mouth 2 (two) times daily. 20 capsule 0  . glipiZIDE (GLUCOTROL XL) 10  MG 24 hr tablet Take 10 mg by mouth 2 (two) times daily.    Marland Kitchen Ketotifen Fumarate (ITCHY EYE DROPS OP) Place 1 drop into both eyes daily as needed (itching/allergies).     . losartan-hydrochlorothiazide (HYZAAR) 100-25 MG tablet Take 1 tablet by mouth daily.     . metFORMIN (GLUCOPHAGE) 500 MG tablet Take 1,000 mg by mouth 2 (two) times daily with a meal.     . pantoprazole (PROTONIX) 40 MG tablet Take 40 mg by mouth daily.     . sertraline (ZOLOFT) 50 MG tablet Take 50 mg by mouth daily.      . sitaGLIPtan (JANUVIA) 100 MG tablet Take 100 mg by mouth daily.      Marland Kitchen terazosin (HYTRIN) 2 MG capsule Take 2 mg by mouth at bedtime.       No current facility-administered medications on file prior to visit.     Allergies  Allergen Reactions  . Penicillins Other (See Comments)    Possibly rash - not sure Has patient had a PCN  reaction causing immediate rash, facial/tongue/throat swelling, SOB or lightheadedness with hypotension:unknown Has patient had a PCN reaction causing severe rash involving mucus membranes or skin necrosis: Unknown Has patient had a PCN reaction that required hospitalization: Unknown Has patient had a PCN reaction occurring within the last 10 years: No Childhood reaction If all of the above answers are "NO", then may proceed with Cephalosporin use.     Blood pressure 132/68, pulse 64.  Hypertension Blood pressure remains slightly above goal during office visit and poorly controlled at home too. Patient is currently on triple therapy with maximum daily dose of Hyzaar (losartan 100mg /HCTZ 25mg ) plus carvedilol 25mg  twice daily. He is tolerating current therapy without significant adverse reaction or problems.  Also noted patient is taking terazosin 2mg  every evening for BPH treatment.  Will increase his terazosin to 4mg  every evening for his antihypertensive properties and continue daily BP monitoring. Blood pressure to be measure every morning (2 hours after taking his morning medication) and every evening. Patient was instructed to keep log and bring to follow up in 3 weeks.   Gust Eugene Rodriguez-Guzman PharmD, [place] [id number] 07/24/2016 4:57 PM

## 2016-07-24 NOTE — Patient Instructions (Addendum)
Return for a follow up appointment in 3 weeks  Your blood pressure today is 132/68 pulse 64   Check your blood pressure at home daily (if able) and keep record of the readings.  Take your BP meds as follows: **Increase terazosin to 4mg  every evening**  Continue all other medication as prescribed  Bring all of your meds, your BP cuff and your record of home blood pressures to your next appointment.  Exercise as you're able, try to walk approximately 30 minutes per day.  Keep salt intake to a minimum, especially watch canned and prepared boxed foods.  Eat more fresh fruits and vegetables and fewer canned items.  Avoid eating in fast food restaurants.    HOW TO TAKE YOUR BLOOD PRESSURE: . Rest 5 minutes before taking your blood pressure. .  Don't smoke or drink caffeinated beverages for at least 30 minutes before. . Take your blood pressure before (not after) you eat. . Sit comfortably with your back supported and both feet on the floor (don't cross your legs). . Elevate your arm to heart level on a table or a desk. . Use the proper sized cuff. It should fit smoothly and snugly around your bare upper arm. There should be enough room to slip a fingertip under the cuff. The bottom edge of the cuff should be 1 inch above the crease of the elbow. . Ideally, take 3 measurements at one sitting and record the average.

## 2016-07-25 ENCOUNTER — Telehealth: Payer: Self-pay

## 2016-07-25 NOTE — Telephone Encounter (Signed)
Received faxed consult notes from [facility]. Pt had C3-4, C4-5 and C5-6 anterior cervical diskectomy, fusion, and plating on 06/26/16. Per Dr. Arnoldo Morale, pt "is doing great approximately 1 month s/p his surgery... Will see him back in a few months to repeat his x-rays and check on his progress." Sent to med records for scanning, copy to Dr. Jaynee Eagles for review.

## 2016-08-14 ENCOUNTER — Ambulatory Visit (INDEPENDENT_AMBULATORY_CARE_PROVIDER_SITE_OTHER): Payer: Medicare Other | Admitting: Pharmacist

## 2016-08-14 VITALS — BP 166/80 | HR 63

## 2016-08-14 DIAGNOSIS — I10 Essential (primary) hypertension: Secondary | ICD-10-CM | POA: Diagnosis not present

## 2016-08-14 DIAGNOSIS — I639 Cerebral infarction, unspecified: Secondary | ICD-10-CM

## 2016-08-14 MED ORDER — HYDRALAZINE HCL 25 MG PO TABS: 25.0000 mg | ORAL_TABLET | Freq: Two times a day (BID) | ORAL | 1 refills | Status: DC

## 2016-08-14 NOTE — Progress Notes (Signed)
Patient ID: Jeffery Fernandez                 DOB: November 01, 1945                      MRN: [id number]     HPI: Jeffery Fernandez is a 71 y.o. male referred by Dr. Oval Linsey to HTN clinic. PMH includes DM, COPD, sleep apnea, CKD, heart murmur, hyperlipidemia, hypertension, and retinal vascular occlusion.  During most recent cardiologist visit on 06/21/2015 his BP was poorly controlled and his metoprolol was changed to carvedilol 25mg  twice daily . Patient also had fusion and plating of C3-4, C4-5, and C5-6 on 06/16/2016. Patient presents today for hypertension follow up. Denies swelling, dizziness, headaches, chest pain or increased fatigue.   Current HTN meds:  Losartan/HTCZ 100-25mg  daily Carvedilol 25mg  twice daily Terazosin 4mg  every evening  Previously tried:  Metoprolol 25mg  twice daily - lack clinical response Amlodipine - face swelling  BP goal: 130/80  Family History: Atrial fibrillation in his brother; Diabetes in his other; Heart attack (age of onset: 72) in his brother; Heart disease in his father; Parkinson's disease in his brother; Stroke in his father; Uterine cancer in his mother.  Social History: Former smoker - quit ~30 years ago.He has quit using smokeless tobacco too. He reports that he does not drink alcohol or use drugs.  Diet:  mainly home cooked meals, no table salt added, occasional fast food, decaf coffee every morning  Exercise: daily walks   Home BP readings:  21 monrning readings; average 147/82 19 evening readings; average 154/82  Wt Readings from Last 3 Encounters:  07/05/16 204 lb (92.5 kg)  07/03/16 202 lb (91.6 kg)  06/27/16 211 lb (95.7 kg)   BP Readings from Last 3 Encounters:  08/14/16 (!) 166/80  07/24/16 132/68  07/05/16 (!) 161/90   Pulse Readings from Last 3 Encounters:  08/14/16 63  07/24/16 64  07/05/16 74    Past Medical History:  Diagnosis Date  . Arrhythmia   . Arthritis   . Chest pain, unspecified   . Chronic airway  obstruction, not elsewhere classified    PT. DENIES HE HAS COPD  . CKD (chronic kidney disease)   . Colon polyps    Adenomatous Polyps 2007  . Depression   . Diabetes mellitus   . Diverticulosis   . Gout   . Heart murmur   . Hyperlipemia   . Hypertension   . Nephrolithiasis   . Obstructive sleep apnea (adult) (pediatric)   . Other and unspecified hyperlipidemia   . Other left bundle branch block   . Retinal vascular occlusion, unspecified    right  . Shortness of breath    on exertion  . Unspecified sleep apnea   . Vision loss of right eye     Current Outpatient Prescriptions on File Prior to Visit  Medication Sig Dispense Refill  . acetaminophen (TYLENOL) 500 MG tablet Take 1,000 mg by mouth every 6 (six) hours as needed (for pain.).    Marland Kitchen atorvastatin (LIPITOR) 40 MG tablet Take 20 mg by mouth at bedtime.     . carvedilol (COREG) 25 MG tablet Take 1 tablet (25 mg total) by mouth 2 (two) times daily with a meal. 60 tablet 5  . doxycycline (VIBRAMYCIN) 100 MG capsule Take 1 capsule (100 mg total) by mouth 2 (two) times daily. 20 capsule 0  . glipiZIDE (GLUCOTROL XL) 10 MG 24 hr tablet Take 10 mg  by mouth 2 (two) times daily.    Marland Kitchen Ketotifen Fumarate (ITCHY EYE DROPS OP) Place 1 drop into both eyes daily as needed (itching/allergies).     . losartan-hydrochlorothiazide (HYZAAR) 100-25 MG tablet Take 1 tablet by mouth daily.     . metFORMIN (GLUCOPHAGE) 500 MG tablet Take 1,000 mg by mouth 2 (two) times daily with a meal.     . pantoprazole (PROTONIX) 40 MG tablet Take 40 mg by mouth daily.     . sertraline (ZOLOFT) 50 MG tablet Take 50 mg by mouth daily.      . sitaGLIPtan (JANUVIA) 100 MG tablet Take 100 mg by mouth daily.      Marland Kitchen terazosin (HYTRIN) 2 MG capsule Take 2 capsules (4 mg total) by mouth at bedtime. 60 capsule 1   No current facility-administered medications on file prior to visit.     Allergies  Allergen Reactions  . Penicillins Other (See Comments)    Possibly  rash - not sure Has patient had a PCN reaction causing immediate rash, facial/tongue/throat swelling, SOB or lightheadedness with hypotension:unknown Has patient had a PCN reaction causing severe rash involving mucus membranes or skin necrosis: Unknown Has patient had a PCN reaction that required hospitalization: Unknown Has patient had a PCN reaction occurring within the last 10 years: No Childhood reaction If all of the above answers are "NO", then may proceed with Cephalosporin use.     Blood pressure (!) 166/80, pulse 63, SpO2 97 %.  Hypertension Blood pressure remains above goal today. No adverse reaction to therapy noted and no issues with compliance either. Terazosin dose was increased during last visit with improvement on morning BP readings but no improvement noted on evening readings. Losartan and carvedilol already at maximum daily dose. Patient had intolerance to amlodipine in the past. Will add hydralazine 25mg  twice daily to therapy and titrate to response. Plan to follow up with HTN clinic in 4 weeks. Will consider spironolactone 12.5mg  daily if not appropriate response obtained with hydralazine.   Marelly Wehrman Rodriguez-Guzman PharmD, [facility] [address] [place] [address] 08/15/2016 9:05 AM

## 2016-08-14 NOTE — Patient Instructions (Addendum)
Return for a  follow up appointment in 5 weeks  Your blood pressure today is 166/80 pulse 63  Check your blood pressure at home daily (if able) and keep record of the readings.  Take your BP meds as follows: START hydralazine 25mg  twice daily *Continue all other medication as prescribed*  Bring all of your meds, your BP cuff and your record of home blood pressures to your next appointment.  Exercise as you're able, try to walk approximately 30 minutes per day.  Keep salt intake to a minimum, especially watch canned and prepared boxed foods.  Eat more fresh fruits and vegetables and fewer canned items.  Avoid eating in fast food restaurants.    HOW TO TAKE YOUR BLOOD PRESSURE: . Rest 5 minutes before taking your blood pressure. .  Don't smoke or drink caffeinated beverages for at least 30 minutes before. . Take your blood pressure before (not after) you eat. . Sit comfortably with your back supported and both feet on the floor (don't cross your legs). . Elevate your arm to heart level on a table or a desk. . Use the proper sized cuff. It should fit smoothly and snugly around your bare upper arm. There should be enough room to slip a fingertip under the cuff. The bottom edge of the cuff should be 1 inch above the crease of the elbow. . Ideally, take 3 measurements at one sitting and record the average.

## 2016-08-15 NOTE — Assessment & Plan Note (Signed)
Blood pressure remains above goal today. No adverse reaction to therapy noted and no issues with compliance either. Terazosin dose was increased during last visit with improvement on morning BP readings but no improvement noted on evening readings. Losartan and carvedilol already at maximum daily dose. Patient had intolerance to amlodipine in the past. Will add hydralazine 25mg  twice daily to therapy and titrate to response. Plan to follow up with HTN clinic in 4 weeks. Will consider spironolactone 12.5mg  daily if not appropriate response obtained with hydralazine.

## 2016-08-26 ENCOUNTER — Telehealth: Payer: Self-pay | Admitting: Pharmacist Clinician (PhC)/ Clinical Pharmacy Specialist

## 2016-08-26 MED ORDER — HYDRALAZINE HCL 25 MG PO TABS: 12.5000 mg | ORAL_TABLET | Freq: Two times a day (BID) | ORAL | 1 refills | Status: DC

## 2016-08-26 NOTE — Telephone Encounter (Signed)
Daughter called, notes that her father has been feeling worse since addition of hydralazine.  Feels lightheaded/dizzy, and daughter noticed slightly unstable with positional changes.  Has not been able to check home BP for past week as they have been traveling.  Will have them cut hydralazine to 12.5 mg bid and monitor home BP bid for next week.  Call after 1 week if still having problems.  Daughter voiced understanding

## 2016-09-18 ENCOUNTER — Ambulatory Visit (INDEPENDENT_AMBULATORY_CARE_PROVIDER_SITE_OTHER): Payer: Medicare Other | Admitting: Pharmacist

## 2016-09-18 VITALS — BP 132/70 | HR 60 | Wt 207.0 lb

## 2016-09-18 DIAGNOSIS — I10 Essential (primary) hypertension: Secondary | ICD-10-CM | POA: Diagnosis not present

## 2016-09-18 DIAGNOSIS — I639 Cerebral infarction, unspecified: Secondary | ICD-10-CM

## 2016-09-18 MED ORDER — IRBESARTAN-HYDROCHLOROTHIAZIDE 300-12.5 MG PO TABS: 1.0000 | ORAL_TABLET | Freq: Every day | ORAL | 5 refills | Status: DC

## 2016-09-18 NOTE — Patient Instructions (Addendum)
Return for a a follow up appointment in 6 weeks (schedule after PCP appointment)   Your blood pressure today is 132/70 pulse 60  Check your blood pressure at home daily (if able) and keep record of the readings.  Take your BP meds as follows: STOP TAKING hydralazine  STOP TAKING LOSARTAN/HCTZ  START TAKING IRBESARTAN/HCTZ 300MG /12.5MG   Bring your BP cuff and your record of home blood pressures to your next appointment.  Exercise as you're able, try to walk approximately 30 minutes per day.  Keep salt intake to a minimum, especially watch canned and prepared boxed foods.  Eat more fresh fruits and vegetables and fewer canned items.  Avoid eating in fast food restaurants.    HOW TO TAKE YOUR BLOOD PRESSURE: . Rest 5 minutes before taking your blood pressure. .  Don't smoke or drink caffeinated beverages for at least 30 minutes before. . Take your blood pressure before (not after) you eat. . Sit comfortably with your back supported and both feet on the floor (don't cross your legs). . Elevate your arm to heart level on a table or a desk. . Use the proper sized cuff. It should fit smoothly and snugly around your bare upper arm. There should be enough room to slip a fingertip under the cuff. The bottom edge of the cuff should be 1 inch above the crease of the elbow. . Ideally, take 3 measurements at one sitting and record the average.

## 2016-09-18 NOTE — Progress Notes (Signed)
Patient ID: Jeffery Fernandez                 DOB: 01-04-1946                      MRN: [id number]     HPI: Jeffery Fernandez is a 71 y.o. male referred by Dr. Oval Linsey to HTN clinic. PMH includes DM, COPD, sleep apnea, CKD, heart murmur, hyperlipidemia, hypertension, and retinal vascular occlusion. Patient also had fusion and plating of C3-4, C4-5, and C5-6 on 06/16/2016. During last HTN office visit hydralazine 25mg  twice daily was added to his therapy but after 5 days usage dose was cut down to 12.5mg  twice daily die to increase dizziness and fatigue.  Patient presents today for hypertension follow up.  Reports he still experiencing significant amount of dizziness and will like to stop taking hydralazine.  Denies swelling, chest pain, shortness of breath or headaches.  Current HTN meds: Losartan/HTCZ 100-25mg  daily Carvedilol 25mg  twice daily Terazosin 4mg  every evening Hydralazine 12.5mg  twice daily  Previously tried: Metoprolol 25mg  twice daily - lack clinical response Amlodipine - face swelling  BP goal: 130/80  Family History: Atrial fibrillation in his brother; Diabetes in his other; Heart attack (age of onset: 66) in his brother; Heart disease in his father; Parkinson's disease in his brother; Stroke in his father; Uterine cancer in his mother.  Social History: Former smoker - quit ~30 years ago.He has quit using smokeless tobacco too. He reports that he does not drink alcohol or use drugs.  Diet:mainly home cooked meals, no table salt added, occasional fast food, decaf coffee every morning  Exercise:daily walks   Home BP readings:  18 morning readings; average 148/81 (HR 61-75bpm) 17 evening readings; average 163/86 (HR 59-71bpm)  Wt Readings from Last 3 Encounters:  09/18/16 207 lb (93.9 kg)  07/05/16 204 lb (92.5 kg)  07/03/16 202 lb (91.6 kg)   BP Readings from Last 3 Encounters:  09/18/16 132/70  08/14/16 (!) 166/80  07/24/16 132/68   Pulse Readings from  Last 3 Encounters:  09/18/16 60  08/14/16 63  07/24/16 64    Past Medical History:  Diagnosis Date  . Arrhythmia   . Arthritis   . Chest pain, unspecified   . Chronic airway obstruction, not elsewhere classified    PT. DENIES HE HAS COPD  . CKD (chronic kidney disease)   . Colon polyps    Adenomatous Polyps 2007  . Depression   . Diabetes mellitus   . Diverticulosis   . Gout   . Heart murmur   . Hyperlipemia   . Hypertension   . Nephrolithiasis   . Obstructive sleep apnea (adult) (pediatric)   . Other and unspecified hyperlipidemia   . Other left bundle branch block   . Retinal vascular occlusion, unspecified    right  . Shortness of breath    on exertion  . Unspecified sleep apnea   . Vision loss of right eye     Current Outpatient Prescriptions on File Prior to Visit  Medication Sig Dispense Refill  . acetaminophen (TYLENOL) 500 MG tablet Take 1,000 mg by mouth every 6 (six) hours as needed (for pain.).    Marland Kitchen atorvastatin (LIPITOR) 40 MG tablet Take 20 mg by mouth at bedtime.     . carvedilol (COREG) 25 MG tablet Take 1 tablet (25 mg total) by mouth 2 (two) times daily with a meal. 60 tablet 5  . glipiZIDE (GLUCOTROL XL) 10 MG  24 hr tablet Take 10 mg by mouth 2 (two) times daily.    . metFORMIN (GLUCOPHAGE) 500 MG tablet Take 1,000 mg by mouth 2 (two) times daily with a meal.     . pantoprazole (PROTONIX) 40 MG tablet Take 40 mg by mouth daily.     . sertraline (ZOLOFT) 50 MG tablet Take 50 mg by mouth daily.      . sitaGLIPtan (JANUVIA) 100 MG tablet Take 100 mg by mouth daily.      Marland Kitchen terazosin (HYTRIN) 2 MG capsule Take 2 capsules (4 mg total) by mouth at bedtime. 60 capsule 1  . doxycycline (VIBRAMYCIN) 100 MG capsule Take 1 capsule (100 mg total) by mouth 2 (two) times daily. (Patient not taking: Reported on 09/18/2016) 20 capsule 0  . Ketotifen Fumarate (ITCHY EYE DROPS OP) Place 1 drop into both eyes daily as needed (itching/allergies).      No current  facility-administered medications on file prior to visit.     Allergies  Allergen Reactions  . Penicillins Other (See Comments)    Possibly rash - not sure Has patient had a PCN reaction causing immediate rash, facial/tongue/throat swelling, SOB or lightheadedness with hypotension:unknown Has patient had a PCN reaction causing severe rash involving mucus membranes or skin necrosis: Unknown Has patient had a PCN reaction that required hospitalization: Unknown Has patient had a PCN reaction occurring within the last 10 years: No Childhood reaction If all of the above answers are "NO", then may proceed with Cephalosporin use.     Blood pressure 132/70, pulse 60, weight 207 lb (93.9 kg).  Hypertension Blood pressure well controlled during office visit but remain non-controlled at home. Home BP cuff not calibrated yet but 35 readings were provided by patient. Morning average remains at 148/81 and evening average remains at 163/86. Also noted patient dizziness with hydralazine administration and desired to stop taking hydralazine. Will discontinue hydralazine, discontinue losartan/HCTZ, and start irbesartan/HCTZ 300/12.5mg  daily. Patient to continue BP monitoring and recording, f/u with PCP already scheduled for next week. He is to follow up with HTN clinic in 4 weeks and bring home BP cuff to calibrate.   Dempsey Ahonen Rodriguez-Guzman PharmD, [place] [address] 09/19/2016 8:07 AM

## 2016-09-19 NOTE — Assessment & Plan Note (Signed)
Blood pressure well controlled during office visit but remain non-controlled at home. Home BP cuff not calibrated yet but 35 readings were provided by patient. Morning average remains at 148/81 and evening average remains at 163/86. Also noted patient dizziness with hydralazine administration and desired to stop taking hydralazine. Will discontinue hydralazine, discontinue losartan/HCTZ, and start irbesartan/HCTZ 300/12.5mg  daily. Patient to continue BP monitoring and recording, f/u with PCP already scheduled for next week. He is to follow up with HTN clinic in 4 weeks and bring home BP cuff to calibrate.

## 2016-10-02 ENCOUNTER — Telehealth: Payer: Self-pay | Admitting: Pharmacist

## 2016-10-02 NOTE — Telephone Encounter (Signed)
Patient tolerating new BP medication better. Less dizziness noted.   Next f/u with HTN clinic schedule for Oct/03/2016

## 2016-10-12 ENCOUNTER — Other Ambulatory Visit: Payer: Self-pay | Admitting: Cardiovascular Disease

## 2016-10-14 NOTE — Telephone Encounter (Signed)
Please review for refill, thanks ! 

## 2016-10-14 NOTE — Telephone Encounter (Signed)
Rx(s) sent to pharmacy electronically. Hydralazine d/c'ed June 2018

## 2016-10-29 NOTE — Progress Notes (Signed)
Patient ID: Jeffery Fernandez                 DOB: 1945-07-14                      MRN: [id number]     HPI: Jeffery Fernandez is a 71 y.o. male referred by Dr. Oval Linsey to HTN clinic. PMH includes DM, COPD, sleep apnea, CKD, heart murmur, hyperlipidemia, hypertension, and retinal vascular occlusion. Patient also had fusion and plating of C3-4, C4-5, and C5-6 on 06/16/2016. During last HTN office visit hydralazine, and losartan/HTCZ were discontinued due to increased dizziness and poor BP control. Irbesartan/HCTZ 300/12.5mg  daily was initiated as an alternative.   Patient presents today for hypertension follow up. Reports minimal improvement on dizziness and increase pain and discomfort on his neck. He also reports increase fatigue. Denies swelling , chest pain, headaches or falls.   Current HTN meds: Carvedilol 25mg  twice daily Irbesartan/HCTZ 300/12.5mg  daily Terazosin 4mg  every evening  Previously tried: Metoprolol 25mg  twice daily - lack clinical response Amlodipine - face swelling Losartan/HCTZ - lack clinical response Hydralazine - dizziness  BP goal: <140/90 (Chronic dizziness and neck problems)  Family History: Atrial fibrillation in his brother; Diabetes in his other; Heart attack (age of onset: 71) in his brother; Heart disease in his father; Parkinson's disease in his brother; Stroke in his father; Uterine cancer in his mother.  Social History: Former smoker - quit ~30 years ago.He has quit using smokeless tobacco too. He reports that he does not drink alcohol or use drugs.  Diet:mainly home cooked meals, no table salt added, occasional fast food, decaf coffee every morning  Exercise:daily walks  Home BP readings:  20 morning readings; average 152/83 (pulse 63-80 bpm) 19 evening readings; average 160/86 (pulse 61-84 bpm)  Wt Readings from Last 3 Encounters:  10/30/16 211 lb 6.4 oz (95.9 kg)  09/18/16 207 lb (93.9 kg)  07/05/16 204 lb (92.5 kg)   BP Readings from  Last 3 Encounters:  10/30/16 (!) 148/80  09/18/16 132/70  08/14/16 (!) 166/80   Pulse Readings from Last 3 Encounters:  10/30/16 70  09/18/16 60  08/14/16 63     Past Medical History:  Diagnosis Date  . Arrhythmia   . Arthritis   . Chest pain, unspecified   . Chronic airway obstruction, not elsewhere classified    PT. DENIES HE HAS COPD  . CKD (chronic kidney disease)   . Colon polyps    Adenomatous Polyps 2007  . Depression   . Diabetes mellitus   . Diverticulosis   . Gout   . Heart murmur   . Hyperlipemia   . Hypertension   . Nephrolithiasis   . Obstructive sleep apnea (adult) (pediatric)   . Other and unspecified hyperlipidemia   . Other left bundle branch block   . Retinal vascular occlusion, unspecified    right  . Shortness of breath    on exertion  . Unspecified sleep apnea   . Vision loss of right eye     Current Outpatient Prescriptions on File Prior to Visit  Medication Sig Dispense Refill  . acetaminophen (TYLENOL) 500 MG tablet Take 1,000 mg by mouth every 6 (six) hours as needed (for pain.).    Marland Kitchen atorvastatin (LIPITOR) 40 MG tablet Take 20 mg by mouth at bedtime.     Marland Kitchen doxycycline (VIBRAMYCIN) 100 MG capsule Take 1 capsule (100 mg total) by mouth 2 (two) times daily. (Patient not taking: Reported  on 09/18/2016) 20 capsule 0  . glipiZIDE (GLUCOTROL XL) 10 MG 24 hr tablet Take 10 mg by mouth 2 (two) times daily.    . irbesartan-hydrochlorothiazide (AVALIDE) 300-12.5 MG tablet Take 1 tablet by mouth daily. Take place of losartan 30 tablet 5  . Ketotifen Fumarate (ITCHY EYE DROPS OP) Place 1 drop into both eyes daily as needed (itching/allergies).     . metFORMIN (GLUCOPHAGE) 500 MG tablet Take 1,000 mg by mouth 2 (two) times daily with a meal.     . pantoprazole (PROTONIX) 40 MG tablet Take 40 mg by mouth daily.     . sertraline (ZOLOFT) 50 MG tablet Take 50 mg by mouth daily.      . sitaGLIPtan (JANUVIA) 100 MG tablet Take 100 mg by mouth daily.        No current facility-administered medications on file prior to visit.     Allergies  Allergen Reactions  . Penicillins Other (See Comments)    Possibly rash - not sure Has patient had a PCN reaction causing immediate rash, facial/tongue/throat swelling, SOB or lightheadedness with hypotension:unknown Has patient had a PCN reaction causing severe rash involving mucus membranes or skin necrosis: Unknown Has patient had a PCN reaction that required hospitalization: Unknown Has patient had a PCN reaction occurring within the last 10 years: No Childhood reaction If all of the above answers are "NO", then may proceed with Cephalosporin use.     Blood pressure (!) 148/80, pulse 70, weight 211 lb 6.4 oz (95.9 kg).  Hypertension Blood pressure still controlled during the office visit (goal <140/90) but patient continues to report dizziness and increased fatigue. Patient also reports pain and discomfort on his neck. Noted multiple office visits to urgent care and PCP documenting well controlled BP. He is to follow up with his neurologist tomorrow. I encouraged the patient to discuss dizziness with neurologist for further assessment. Home BP readings remains elevated well above goal but still waiting for patient to bring home BP machine to calibrate.   Will continue Irbesartan 300/12.5mg  daily, change carvedilol to nevibolol 10mg  to decrease alpha activity, and will decrease terazosin back to 2mg  at bedtime. Patient is to continue twice daily BP monitoring to bring records and home monitor to next f/u visit in 2 weeks. Plan to increase nevibolol to 20mg  if patient able to tolerate.    Jeffery Fernandez PharmD, BCPS, [facility] [address] [place] [address] 10/31/2016 9:10 PM

## 2016-10-30 ENCOUNTER — Other Ambulatory Visit: Payer: Self-pay | Admitting: Cardiovascular Disease

## 2016-10-30 ENCOUNTER — Ambulatory Visit (INDEPENDENT_AMBULATORY_CARE_PROVIDER_SITE_OTHER): Payer: Medicare Other | Admitting: Pharmacist

## 2016-10-30 VITALS — BP 148/80 | HR 70 | Wt 211.4 lb

## 2016-10-30 DIAGNOSIS — I10 Essential (primary) hypertension: Secondary | ICD-10-CM | POA: Diagnosis not present

## 2016-10-30 DIAGNOSIS — I639 Cerebral infarction, unspecified: Secondary | ICD-10-CM

## 2016-10-30 LAB — BASIC METABOLIC PANEL
BUN / CREAT RATIO: 16 (ref 10–24)
BUN: 23 mg/dL (ref 8–27)
CO2: 24 mmol/L (ref 20–29)
CREATININE: 1.45 mg/dL — AB (ref 0.76–1.27)
Calcium: 9.3 mg/dL (ref 8.6–10.2)
Chloride: 100 mmol/L (ref 96–106)
GFR calc Af Amer: 55 mL/min/{1.73_m2} — ABNORMAL LOW (ref >59)
GFR calc non Af Amer: 47 mL/min/{1.73_m2} — ABNORMAL LOW (ref >59)
GLUCOSE: 187 mg/dL — AB (ref 65–99)
Potassium: 3.9 mmol/L (ref 3.5–5.2)
SODIUM: 141 mmol/L (ref 134–144)

## 2016-10-30 MED ORDER — TERAZOSIN HCL 2 MG PO CAPS: 2.0000 mg | ORAL_CAPSULE | Freq: Every day | ORAL | 4 refills | Status: DC

## 2016-10-30 MED ORDER — NEBIVOLOL HCL 10 MG PO TABS: 10.0000 mg | ORAL_TABLET | Freq: Every day | ORAL | 1 refills | Status: DC

## 2016-10-30 NOTE — Patient Instructions (Addendum)
Return for a  follow up appointment in 2 weeks   Your blood pressure today is  148/80 pulse 70  Check your blood pressure at home daily (if able) and keep record of the readings.  Take your BP meds as follows: *STOP taking Carvedilol 25mg  after tonight 10/30/2016 *START taking Bystolic 10mg  daily - 1st dose 10/31/2016 *Decrease terazosin dose to 2mg  every evening  **Follow up with neurologist in 2 days for dizziness and neck discomfort   Bring all of your meds, your BP cuff and your record of home blood pressures to your next appointment.  Exercise as you're able, try to walk approximately 30 minutes per day.  Keep salt intake to a minimum, especially watch canned and prepared boxed foods.  Eat more fresh fruits and vegetables and fewer canned items.  Avoid eating in fast food restaurants.    HOW TO TAKE YOUR BLOOD PRESSURE: . Rest 5 minutes before taking your blood pressure. .  Don't smoke or drink caffeinated beverages for at least 30 minutes before. . Take your blood pressure before (not after) you eat. . Sit comfortably with your back supported and both feet on the floor (don't cross your legs). . Elevate your arm to heart level on a table or a desk. . Use the proper sized cuff. It should fit smoothly and snugly around your bare upper arm. There should be enough room to slip a fingertip under the cuff. The bottom edge of the cuff should be 1 inch above the crease of the elbow. . Ideally, take 3 measurements at one sitting and record the average.

## 2016-10-31 NOTE — Assessment & Plan Note (Signed)
Blood pressure still controlled during the office visit (goal <140/90) but patient continues to report dizziness and increased fatigue. Patient also reports pain and discomfort on his neck. Noted multiple office visits to urgent care and PCP documenting well controlled BP. He is to follow up with his neurologist tomorrow. I encouraged the patient to discuss dizziness with neurologist for further assessment. Home BP readings remains elevated well above goal but still waiting for patient to bring home BP machine to calibrate.   Will continue Irbesartan 300/12.5mg  daily, change carvedilol to nevibolol 10mg  to decrease alpha activity, and will decrease terazosin back to 2mg  at bedtime. Patient is to continue twice daily BP monitoring to bring records and home monitor to next f/u visit in 2 weeks. Plan to increase nevibolol to 20mg  if patient able to tolerate.

## 2016-11-18 ENCOUNTER — Ambulatory Visit (INDEPENDENT_AMBULATORY_CARE_PROVIDER_SITE_OTHER): Payer: Medicare Other | Admitting: Pharmacist

## 2016-11-18 VITALS — BP 175/88 | HR 60

## 2016-11-18 DIAGNOSIS — I639 Cerebral infarction, unspecified: Secondary | ICD-10-CM

## 2016-11-18 DIAGNOSIS — I10 Essential (primary) hypertension: Secondary | ICD-10-CM

## 2016-11-18 MED ORDER — NEBIVOLOL HCL 10 MG PO TABS: 15.0000 mg | ORAL_TABLET | Freq: Every day | ORAL | 1 refills | Status: DC

## 2016-11-18 NOTE — Assessment & Plan Note (Signed)
Blood pressure today remains above goal of 140/90 but patient reports significant improvement on dizziness and fatigue. Noted BP home readings show better control with morning average reading of 155/86 and evening average 154/80. Hear rate remains appropriate with 60 bpm in office and an average of 66 bpm at home. Patient is to start PT per neurologist recommendation to improve balance and strength.    Will change Nebivolol to evening administration and increase dose to 15mg  daily. Continue Irbesartan/HCTZ every morning and terazosin 2mg  at bedtime. Patient to continue monitoring BP twice daily at home and bring records to next F/U appointment in 4 weeks. Plan to discuss BP target with cardiologist if patient continues to experience dizziness and fatigue every time BP control improves. Patient may be a better candidate for target BP of 150/90.

## 2016-11-18 NOTE — Progress Notes (Signed)
Patient ID: Jeffery Fernandez                 DOB: [birth year]                      MRN: [id number]     HPI: [name] is a 71 y.o. male referred by Dr. Oval Linsey to HTN clinic. PMH includes DM, COPD, sleep apnea, CKD, heart murmur, hyperlipidemia, hypertension, and retinal vascular occlusion. Patient also had fusion and plating of C3-4, C4-5, and C5-6 on 06/16/2016. During last office visit his BP was well controlled but patient experienced severe dizziness and fatigue.  Terazosin dose was decreased back to 2mg  every evening, and carvedilol was changed to nebivolol 10mg  daily to decrease ADR to medication and fall risk.   Patient presents today for hypertension follow up. Reports significant improvement on dizziness and fatigue symptoms.  Denies swelling, chest pain, headaches or falls. Neurologist recommended PT and patient will start ASAP. Per patient report, his neurologist was pleased with BP reading as well.  Current HTN meds: Nebivolol 10 mg daily (early in am) Irbesartan/HCTZ 300/12.5mg  daily (early in am) Terazosin 2mg  every evening  Previously tried: Metoprolol 25mg  twice daily - lack clinical response Amlodipine - face swelling Losartan/HCTZ - lack clinical response Hydralazine - dizziness  BP goal: 140/90 (Chronic dizziness and neck problems)  Family History: Atrial fibrillation in his brother; Diabetes in his other; Heart attack (age of onset: 33) in his brother; Heart disease in his father; Parkinson's disease in his brother; Stroke in his father; Uterine cancer in his mother.  Social History: Former smoker - quit ~30 years ago.He has quit using smokeless tobacco too. He reports that he does not drink alcohol or use drugs.  Diet:mainly home cooked meals, no table salt added, occasional fast food, decaf coffee every morning  Exercise:daily walks and about top start PT per neurologist orders  Home BP readings:  18 morning readings; average 155/86 (pulse 60 -  71) 17 evening readings; average 154/80 (pulse 55-76)  Wt Readings from Last 3 Encounters:  10/30/16 211 lb 6.4 oz (95.9 kg)  09/18/16 207 lb (93.9 kg)  07/05/16 204 lb (92.5 kg)   BP Readings from Last 3 Encounters:  11/18/16 (!) 175/88  10/30/16 (!) 148/80  09/18/16 132/70   Pulse Readings from Last 3 Encounters:  11/18/16 60  10/30/16 70  09/18/16 60    Past Medical History:  Diagnosis Date  . Arrhythmia   . Arthritis   . Chest pain, unspecified   . Chronic airway obstruction, not elsewhere classified    PT. DENIES HE HAS COPD  . CKD (chronic kidney disease)   . Colon polyps    Adenomatous Polyps 2007  . Depression   . Diabetes mellitus   . Diverticulosis   . Gout   . Heart murmur   . Hyperlipemia   . Hypertension   . Nephrolithiasis   . Obstructive sleep apnea (adult) (pediatric)   . Other and unspecified hyperlipidemia   . Other left bundle branch block   . Retinal vascular occlusion, unspecified    right  . Shortness of breath    on exertion  . Unspecified sleep apnea   . Vision loss of right eye     Current Outpatient Prescriptions on File Prior to Visit  Medication Sig Dispense Refill  . acetaminophen (TYLENOL) 500 MG tablet Take 1,000 mg by mouth every 6 (six) hours as needed (for pain.).    Marland Kitchen  atorvastatin (LIPITOR) 40 MG tablet Take 20 mg by mouth at bedtime.     Marland Kitchen doxycycline (VIBRAMYCIN) 100 MG capsule Take 1 capsule (100 mg total) by mouth 2 (two) times daily. (Patient not taking: Reported on 09/18/2016) 20 capsule 0  . glipiZIDE (GLUCOTROL XL) 10 MG 24 hr tablet Take 10 mg by mouth 2 (two) times daily.    . irbesartan-hydrochlorothiazide (AVALIDE) 300-12.5 MG tablet Take 1 tablet by mouth daily. Take place of losartan 30 tablet 5  . Ketotifen Fumarate (ITCHY EYE DROPS OP) Place 1 drop into both eyes daily as needed (itching/allergies).     . metFORMIN (GLUCOPHAGE) 500 MG tablet Take 1,000 mg by mouth 2 (two) times daily with a meal.     .  pantoprazole (PROTONIX) 40 MG tablet Take 40 mg by mouth daily.     . sertraline (ZOLOFT) 50 MG tablet Take 50 mg by mouth daily.      . sitaGLIPtan (JANUVIA) 100 MG tablet Take 100 mg by mouth daily.      Marland Kitchen terazosin (HYTRIN) 2 MG capsule Take 1 capsule (2 mg total) by mouth at bedtime. 30 capsule 4   No current facility-administered medications on file prior to visit.     Allergies  Allergen Reactions  . Penicillins Other (See Comments)    Possibly rash - not sure Has patient had a PCN reaction causing immediate rash, facial/tongue/throat swelling, SOB or lightheadedness with hypotension:unknown Has patient had a PCN reaction causing severe rash involving mucus membranes or skin necrosis: Unknown Has patient had a PCN reaction that required hospitalization: Unknown Has patient had a PCN reaction occurring within the last 10 years: No Childhood reaction If all of the above answers are "NO", then may proceed with Cephalosporin use.     Blood pressure (!) 175/88, pulse 60, SpO2 96 %.  Hypertension Blood pressure today remains above goal of 140/90 but patient reports significant improvement on dizziness and fatigue. Noted BP home readings show better control with morning average reading of 155/86 and evening average 154/80. Hear rate remains appropriate with 60 bpm in office and an average of 66 bpm at home. Patient is to start PT per neurologist recommendation to improve balance and strength.    Will change Nebivolol to evening administration and increase dose to 15mg  daily. Continue Irbesartan/HCTZ every morning and terazosin 2mg  at bedtime. Patient to continue monitoring BP twice daily at home and bring records to next F/U appointment in 4 weeks. Plan to discuss BP target with cardiologist if patient continues to experience dizziness and fatigue every time BP control improves. Patient may be a better candidate for target BP of 150/90.   Kimra Kantor Rodriguez-Guzman PharmD, BCPS, [place] [id number] 11/18/2016 10:07 AM

## 2016-11-18 NOTE — Patient Instructions (Addendum)
Return for a  follow up appointment in 4 weeks  Your blood pressure today is 175/88 pulse 60  Check your blood pressure at home daily (if able) and keep record of the readings.  Take your BP meds as follows: *INCREASE Bystolic to 15mg  (1 and 1/2 tablets daily) every evening* *Take Irbesartan every monring* Continue all other medication as prescribed   Bring all of your meds, your BP cuff and your record of home blood pressures to your next appointment.  Exercise as you're able, try to walk approximately 30 minutes per day.  Keep salt intake to a minimum, especially watch canned and prepared boxed foods.  Eat more fresh fruits and vegetables and fewer canned items.  Avoid eating in fast food restaurants.    HOW TO TAKE YOUR BLOOD PRESSURE: . Rest 5 minutes before taking your blood pressure. .  Don't smoke or drink caffeinated beverages for at least 30 minutes before. . Take your blood pressure before (not after) you eat. . Sit comfortably with your back supported and both feet on the floor (don't cross your legs). . Elevate your arm to heart level on a table or a desk. . Use the proper sized cuff. It should fit smoothly and snugly around your bare upper arm. There should be enough room to slip a fingertip under the cuff. The bottom edge of the cuff should be 1 inch above the crease of the elbow. . Ideally, take 3 measurements at one sitting and record the average.

## 2016-11-25 ENCOUNTER — Other Ambulatory Visit: Payer: Self-pay | Admitting: *Deleted

## 2016-11-25 ENCOUNTER — Other Ambulatory Visit: Payer: Self-pay | Admitting: Cardiovascular Disease

## 2016-11-25 MED ORDER — NEBIVOLOL HCL 10 MG PO TABS: 15.0000 mg | ORAL_TABLET | Freq: Every day | ORAL | 9 refills | Status: DC

## 2016-11-25 NOTE — Telephone Encounter (Signed)
°*  STAT* If patient is at the pharmacy, call can be transferred to refill team.   1. Which medications need to be refilled? (please list name of each medication and dose if known) Bystolic 15 mg (needs a new prescription )  2. Which pharmacy/location (including street and city if local pharmacy) is medication to be sent to?[facility] on [address] [employer] [place] [employer]    3. Do they need a 30 day or 90 day supply? [place]

## 2016-11-26 NOTE — Telephone Encounter (Signed)
F/u Message  Pt wife call to f/u on medication refill requesting. Please call back to discuss

## 2016-11-27 ENCOUNTER — Telehealth: Payer: Self-pay | Admitting: Cardiovascular Disease

## 2016-11-27 ENCOUNTER — Encounter: Payer: Self-pay | Admitting: Physical Therapy

## 2016-11-27 ENCOUNTER — Ambulatory Visit: Payer: Medicare Other | Attending: Neurosurgery | Admitting: Physical Therapy

## 2016-11-27 ENCOUNTER — Other Ambulatory Visit: Payer: Self-pay

## 2016-11-27 DIAGNOSIS — M6281 Muscle weakness (generalized): Secondary | ICD-10-CM | POA: Insufficient documentation

## 2016-11-27 DIAGNOSIS — M62838 Other muscle spasm: Secondary | ICD-10-CM | POA: Insufficient documentation

## 2016-11-27 NOTE — Therapy (Signed)
[facility] [address] [place], [address] Phone: [phone]   Fax:  [phone]  Physical Therapy Evaluation  Patient Details  Name: Jeffery Fernandez MRN: [id number] Date of Birth: 1945-11-30 Referring Provider: Ophelia Charter MD  Encounter Date: 11/27/2016      PT End of Session - 11/27/16 1340    Visit Number 1   Number of Visits 12   Date for PT Re-Evaluation 01/08/17   Authorization Type 1   Authorization Time Period 10 G codes   PT Start Time 1308   PT Stop Time 1400   PT Time Calculation (min) 52 min   Activity Tolerance Patient tolerated treatment well   Behavior During Therapy [facility] for tasks assessed/performed      Past Medical History:  Diagnosis Date  . Arrhythmia   . Arthritis   . Chest pain, unspecified   . Chronic airway obstruction, not elsewhere classified    PT. DENIES HE HAS COPD  . CKD (chronic kidney disease)   . Colon polyps    Adenomatous Polyps 2007  . Depression   . Diabetes mellitus   . Diverticulosis   . Gout   . Heart murmur   . Hyperlipemia   . Hypertension   . Nephrolithiasis   . Obstructive sleep apnea (adult) (pediatric)   . Other and unspecified hyperlipidemia   . Other left bundle branch block   . Retinal vascular occlusion, unspecified    right  . Shortness of breath    on exertion  . Unspecified sleep apnea   . Vision loss of right eye     Past Surgical History:  Procedure Laterality Date  . ANTERIOR CERVICAL DECOMP/DISCECTOMY FUSION N/A 06/26/2016   Procedure: Re-Exploration of Cervical Wound;  Surgeon: Kary Kos, MD;  Location: [facility];  Service: Neurosurgery;  Laterality: N/A;  . ANTERIOR CERVICAL DECOMP/DISCECTOMY FUSION N/A 06/26/2016   Procedure: ANTERIOR CERVICAL DECOMPRESSION/DISCECTOMY FUSION, INTERBODY PROSTESIS, PLATE, CERVICAL THREE CERVICAL FOUR, CERVICAL FOUR CERVICAL FIVE CERVICAL SIX;  Surgeon: Newman Pies, MD;  Location: [facility];   Service: Neurosurgery;  Laterality: N/A;  . CATARACT EXTRACTION Bilateral   . KIDNEY STONE SURGERY      There were no vitals filed for this visit.       Subjective Assessment - 11/27/16 1326    Subjective Patient reports he is having pain and weakness in UE's; unable to lift like he would like to.    Pertinent History history of neck pain with progressive weakness in right UE that began around 09/2015. He had PT prior to surgery with mixed results and underwent surgery for cervical spine decomprression with fusion 06/26/2016. Since surgery he has been self managing healing at home. Overall he has noticed less pain in neck and head and improved strength in right LE. He continues with weakness in both UE's and back.   Limitations Lifting;House hold activities;Other (comment)  holding grandchildren   Patient Stated Goals improve upper body strength to be able to play with grandchildren    Currently in Pain? No/denies   Aggravating Factors  lifting >10# (grandchildren)   Pain Relieving Factors rest   Effect of Pain on Daily Activities limits ability to do yard work (weed eating); wood working            [facility] PT Assessment - 11/27/16 1343      Assessment   Medical Diagnosis spondylosis cervical spine with myelopathy    Referring Provider Newman Pies  D MD   Onset Date/Surgical Date 06/26/16   Hand Dominance Right     Precautions   Precautions None     Balance Screen   Has the patient fallen in the past 6 months No     [place] residence   Living Arrangements Spouse/significant other   Type of [place] to enter   Entrance Stairs-Number of Steps --  5   Entrance Stairs-Rails Right;Left;Can reach both   [place] One level   [place] - single point     Prior Function   Level of [place] Retired     Engineer, production Intact     ROM / Strength   AROM /  PROM / Strength AROM;Strength     AROM   Overall AROM Comments bilateral UE's WFL      Strength   Overall Strength Comments decreased right shoulder flexion 4-/5, ER 4/5, biceps 3-/5 : grip strength right 60#, left 55#     Palpation   Palpation comment cervical spine and upper trapezius muscles with spasms and tenderness            Objective measurements completed on examination: See above findings.                  PT Education - 11/27/16 1337    Education provided Yes   Education Details posture; exercise instruction; POC   Person(s) Educated Patient   Methods Explanation;Demonstration;Verbal cues;Handout   Comprehension Verbalized understanding;Returned demonstration;Verbal cues required             PT Long Term Goals - 11/27/16 1952      PT LONG TERM GOAL #1   Title Patient will be independent in home exercise program by discharge to improve strength/mobility for improved right UE use for yard work and playing with grandchildren    Baseline limited knowledge of appropriate exercises and progression without guidance and moderate cuing/demonstration   Status New   Target Date 01/08/17     PT LONG TERM GOAL #2   Title improve NDI to <15% demonstrating improved function with ADL's with less difficulty    Baseline NDI 26%   Status New   Target Date 12/25/16     PT LONG TERM GOAL #3   Title Patient will demonstrate improved strength right UE elbow flexion to at least 3+/5 by discharge to allow continued exercise and be able to perform light activties with minimal to no difficulty    Baseline right elbow flexoin 3-/5   Status New   Target Date 01/08/17                Plan - 11/27/16 1942    Clinical Impression Statement Patient is a 71 year old right hand dominant male who presents s/p cervical spine fusion x ~5 months (06/26/16). He has limited strength right UE primarily biceps and shoulder ER and limitations with functional use of right  UE. His NDI score of 26% indicates minimal self perceived disability due to cervical spine. He has limited knowledge of appropriate exercises and progression to improve strength and functional use of right UE in order to enjoy time with grandchildren and work in the yard. He will therefore benefit from physical therapy intervention to achieve goals.   History and Personal Factors relevant to plan of care: history of neck pain with progressive weakness in right UE that began  around 09/2015. He had PT prior to surgery with mixed results and underwent surgery for cervical spine decomprression with fusion 06/26/2016. Since surgery he has been self managing healing at home. Overall he has noticed less pain in neck and head and improved strength in right LE. He continues with weakness in both UE's and back.   Clinical Presentation Stable   Clinical Presentation due to: improving and no pain s/p cervical spine surgery   Clinical Decision Making Low   Rehab Potential Fair   PT Frequency 2x / week   PT Duration 6 weeks   PT Treatment/Interventions Electrical Stimulation;Patient/family education;Neuromuscular re-education;Therapeutic exercise;Manual techniques   PT Next Visit Plan modalities for muscle re education right UE biceps, manual therapy for spasm in upper traps/cervical spine; therapeutic exercises for strength right UE, upper back   PT Home Exercise Plan posture awareness, scapular retraction, active right elbow flexion daily and light weights 1-3# 3x/week   Consulted and Agree with Plan of Care Patient      Patient will benefit from skilled therapeutic intervention in order to improve the following deficits and impairments:  Decreased strength, Decreased activity tolerance, Impaired perceived functional ability, Impaired UE functional use, Increased muscle spasms, Decreased endurance  Visit Diagnosis: Muscle weakness (generalized) - Plan: PT plan of care cert/re-cert  Other muscle spasm - Plan:  PT plan of care cert/re-cert      G-Codes - 80/88/11 1958    Functional Assessment Tool Used (Outpatient Only) NDI, strength, ROM, clinical judgment    Functional Limitation Carrying, moving and handling objects   Carrying, Moving and Handling Objects Current Status (S3159) At least 20 percent but less than 40 percent impaired, limited or restricted   Carrying, Moving and Handling Objects Goal Status (Y5859) At least 1 percent but less than 20 percent impaired, limited or restricted       Problem List Patient Active Problem List   Diagnosis Date Noted  . Cervical spondylosis with myelopathy and radiculopathy 06/26/2016  . Spinal stenosis of cervical region 06/26/2016  . Personal history of colonic polyps 06/14/2011  . CAD (coronary artery disease) 07/10/2010  . OSA (obstructive sleep apnea) 06/10/2010  . Hypertension 06/10/2010  . COPD (chronic obstructive pulmonary disease) ([place]) 06/10/2010  . Hyperlipidemia 06/10/2010  . Retinal artery occlusion 06/10/2010    Jomarie Longs PT 11/27/2016, 8:07 PM  [place] [facility] [address], [place], [address] Phone: [phone]   Fax:  [phone]  Name: Jeffery Fernandez MRN: [id number] Date of Birth: Mar 09, 1945

## 2016-11-27 NOTE — Telephone Encounter (Signed)
Per Cover my meds the alternatives EJY:LTEIHDTP, Bisoprolol Fumarate, Carvedilol, Metoprolol Tartrate, Metoprolol Succinate ER

## 2016-11-27 NOTE — Telephone Encounter (Signed)
Pt notified follow up due with dr Oval Linsey appt scheduled

## 2016-11-27 NOTE — Telephone Encounter (Signed)
New message  Pt c/o medication issue:  1. Name of Medication: Bystolic   2. How are you currently taking this medication (dosage and times per day)? 10mg    3. Are you having a reaction (difficulty breathing--STAT)? No   4. What is your medication issue? Per pt wife pt needs a pre authorization for medication. Per pt wife the medication is to expensive. Please call back to discuss

## 2016-11-27 NOTE — Telephone Encounter (Signed)
Spoke with wife she states that she has never heard anything about the proir auth. Informed pt that today was the first time we have received a message that they needed a PA. She states that bystolic is >$004 and they cannot afford that. Informed wife that this may take up to 2 weeks to get approved and this is dependant oh their insurance. She is very upset that this is not done yet. I put samples at the front desk for them to pick up she states that why would they continue if they cannot afford, informed pt that if the insurance approves it will be more affordable. She states ok and will come and get the samples. Will forward to Dr Blenda Mounts nurse as she has started PA already

## 2016-12-02 ENCOUNTER — Telehealth: Payer: Self-pay | Admitting: Cardiology

## 2016-12-02 ENCOUNTER — Encounter (HOSPITAL_COMMUNITY): Payer: Self-pay

## 2016-12-02 ENCOUNTER — Emergency Department (HOSPITAL_COMMUNITY)
Admission: EM | Admit: 2016-12-02 | Discharge: 2016-12-02 | Disposition: A | Discharge status: Home / Self Care | Payer: Medicare Other | Attending: Emergency Medicine | Admitting: Emergency Medicine

## 2016-12-02 ENCOUNTER — Ambulatory Visit: Payer: Medicare Other

## 2016-12-02 DIAGNOSIS — I10 Essential (primary) hypertension: Secondary | ICD-10-CM | POA: Diagnosis present

## 2016-12-02 DIAGNOSIS — Z5321 Procedure and treatment not carried out due to patient leaving prior to being seen by health care provider: Secondary | ICD-10-CM | POA: Diagnosis not present

## 2016-12-02 NOTE — ED Triage Notes (Signed)
Pt states that his BP has been reading high all day at home at 201/108. Pt states that he also has a headache. BP now 149/79

## 2016-12-02 NOTE — Telephone Encounter (Signed)
Pt's daughter called her father has had elevated BP all day .  Started at 158/87 and despite taking his meds BP up to 208/108 and now 204/92.  He has not felt well with weakness, unable to sleep headache.   He has been seen in BP clinic with some improvement.  Today is higher than usual.  No other changes.  Since pt feels bad and no meds he can take po will bring down enough I have asked him to go to ER to be treated.        He is intolerant to amlodipine face swelling and hydralazine caused dizziness.  Losartan hctz without clinical response and same for metoprolol

## 2016-12-03 ENCOUNTER — Ambulatory Visit: Payer: Medicare Other | Admitting: Physical Therapy

## 2016-12-03 NOTE — Telephone Encounter (Signed)
Spoke with pt dtr, last night before he left the er his bp was 190/84. This morning prior to taking his medications his bp is 164/83. They have a follow up appointment with dr Oval Linsey tomorrow and feel that will be fine. They will continue to monitor bp and bring those numbers and all his medications to the appointment. They will call back today if they have questions or problems.

## 2016-12-03 NOTE — Telephone Encounter (Signed)
Follow up     Patient daughter calling, states she took her father to ED on last evening as advised by PA, got tired of waiting and left. States his blood pressure is probably still high, but she has not awaken him up this morning to check it.  Please call.

## 2016-12-04 ENCOUNTER — Encounter: Payer: Self-pay | Admitting: Cardiovascular Disease

## 2016-12-04 ENCOUNTER — Ambulatory Visit (INDEPENDENT_AMBULATORY_CARE_PROVIDER_SITE_OTHER): Payer: Medicare Other | Admitting: Cardiovascular Disease

## 2016-12-04 VITALS — BP 102/50 | HR 64 | Ht 71.0 in | Wt 202.2 lb

## 2016-12-04 DIAGNOSIS — I2510 Atherosclerotic heart disease of native coronary artery without angina pectoris: Secondary | ICD-10-CM

## 2016-12-04 DIAGNOSIS — I639 Cerebral infarction, unspecified: Secondary | ICD-10-CM

## 2016-12-04 DIAGNOSIS — R55 Syncope and collapse: Secondary | ICD-10-CM

## 2016-12-04 DIAGNOSIS — I10 Essential (primary) hypertension: Secondary | ICD-10-CM

## 2016-12-04 DIAGNOSIS — E7800 Pure hypercholesterolemia, unspecified: Secondary | ICD-10-CM

## 2016-12-04 NOTE — Progress Notes (Signed)
Cardiology Office Note   Date:  12/04/2016   ID:  Jeffery Fernandez, DOB May 28, 1945, MRN [id number]  PCP:  Dione Housekeeper, MD  Cardiologist:   Skeet Latch, MD   Chief Complaint  Patient presents with  . Fatigue    pt stated that when is stand after sitting he felt dizzy then fell, pt stated he's been feeling wozy and swimmy headed.  . Hypertension      History of Present Illness: Jeffery Fernandez is a 71 y.o. male medically managed CAD, hypertension, hyperlipidemia, diabetes, COPD, LBBB, and retinal artery occlusion who presents for follow-up.  He was initially seen for pre-surgical risk assessment 05/2016 prior to cervical spine surgery.  He was found to be at acceptable risk for surgery.  At that appointment his blood pressure was poorly-controlled.  Metoprolol was switched to carvedilol.  He followed up with our pharmacist and his blood pressure remained poorly-controlled So hydralazine was added to his regimen.  He stopped taking this due to dizziness, though his blood pressure still was not well-controlled.  Losartan was switched to irbesartan.  Carvedilol was switched to nebivolol due to fatigue and terazosin was reduced.    He underwent C3 through 6 anterior cervical discectomy/decompression with insertion of bone graft and interbody prostheses on 5/30.  He developed an anterior cervical wound hematoma and possible CSF leak.  He underwent reexploration on 5/30 and was discharged home 6/1.  There is some confusion as to whether he had actually central retinal artery occlusion versus temporal arteritis.  He was evaluated by Dr. Jaynee Eagles and based on the information available to her at that time it was not clear that he needed both aspirin and clopidogrel.  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  He had an echo and exercise Cardiolite that were unremarkable.  He also wore a 21 day event monitor for three weeks and no  atrial fibrillation was noted.  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  In retrospect he thinks that this is what was wrong with his right eye.  Jeffery Fernandez was seen  Of shortness of breath in 2012 and was thought to have a COPD exacerbation.  However he didn't have a history of COPD.  He was referred for cardiac catheterization 05/2010, at which time he was noted to have normal right and left heart filling pressures.  He had 50-60 mid RCA stenosis.  Echo revealed LVEF 55% and was otherwise unremarkable.   This week it was running very high.  He called our office at night and was referred to the ED.  The wait was very long in the ED so he decided to go home.  He subsequently found out that he has a large abscess in his right upper mandible.  He saw his dentist yesterday and underwent incision and drainage.  He was started on amoxicillin.  Prior to incision and drainage she also had chills and a temperature of 99.5.  Since then he is starting to feel better.  However last night upon standing he passed out.  This morning he continues to feel lightheaded and dizzy.  He took all his blood pressure medications as prescribed.  His blood pressure log shows that prior to this week his blood pressure was running in the 130s-150s with occasional episodes going to 160s or 170s.  In the last few days  it is been in the 110s over 50s-60s.  This morning it was 102/50.  He notes occasional episodes of chest pain that feels like indigestion.  He never has this with exertion.  He takes care of his twin grandchildren who are 82-month-old.  He gets tired later in the day but otherwise feels well.  He is unsure if his fatigue improved after switching from carvedilol to nebivolol.  For the last week he has had headaches but this improved yesterday.  He denies lower extremity edema, orthopnea, or PND.     Past Medical History:  Diagnosis Date  . Arrhythmia   . Arthritis   . Chest pain,  unspecified   . Chronic airway obstruction, not elsewhere classified    PT. DENIES HE HAS COPD  . CKD (chronic kidney disease)   . Colon polyps    Adenomatous Polyps 2007  . Depression   . Diabetes mellitus   . Diverticulosis   . Gout   . Heart murmur   . Hyperlipemia   . Hypertension   . Nephrolithiasis   . Obstructive sleep apnea (adult) (pediatric)   . Other and unspecified hyperlipidemia   . Other left bundle branch block   . Retinal vascular occlusion, unspecified    right  . Shortness of breath    on exertion  . Unspecified sleep apnea   . Vision loss of right eye     Past Surgical History:  Procedure Laterality Date  . CATARACT EXTRACTION Bilateral   . KIDNEY STONE SURGERY       Current Outpatient Medications  Medication Sig Dispense Refill  . acetaminophen (TYLENOL) 500 MG tablet Take 1,000 mg by mouth every 6 (six) hours as needed (for pain.).    Marland Kitchen amoxicillin-clavulanate (AUGMENTIN) 875-125 MG tablet Take 1 tablet 2 (two) times daily by mouth.    Marland Kitchen atorvastatin (LIPITOR) 40 MG tablet Take 20 mg by mouth at bedtime.     Marland Kitchen glipiZIDE (GLUCOTROL XL) 10 MG 24 hr tablet Take 10 mg by mouth 2 (two) times daily.    . irbesartan-hydrochlorothiazide (AVALIDE) 300-12.5 MG tablet Take 1 tablet by mouth daily. Take place of losartan 30 tablet 5  . Ketotifen Fumarate (ITCHY EYE DROPS OP) Place 1 drop into both eyes daily as needed (itching/allergies).     . metFORMIN (GLUCOPHAGE) 500 MG tablet Take 1,000 mg by mouth 2 (two) times daily with a meal.     . nebivolol (BYSTOLIC) 10 MG tablet Take 1.5 tablets (15 mg total) by mouth daily. 45 tablet 9  . pantoprazole (PROTONIX) 40 MG tablet Take 40 mg by mouth daily.     . sertraline (ZOLOFT) 100 MG tablet Take 100 mg daily by mouth.     . sitaGLIPtan (JANUVIA) 100 MG tablet Take 100 mg by mouth daily.      Marland Kitchen terazosin (HYTRIN) 2 MG capsule Take 1 capsule (2 mg total) by mouth at bedtime. 30 capsule 4   No current  facility-administered medications for this visit.     Allergies:   Penicillins    Social History:  The patient  reports that he quit smoking about 30 years ago. His smoking use included cigarettes. He has a 37.50 pack-year smoking history. he has never used smokeless tobacco. He reports that he does not drink alcohol or use drugs.   Family History:  The patient's family history includes Arthritis/Rheumatoid in his cousin; Atrial fibrillation in his brother; Diabetes in his other; Heart attack (age of onset: 62) in  his brother; Heart disease in his father; Parkinson's disease in his brother; Stroke in his father; Uterine cancer in his mother.    ROS:  Please see the history of present illness.   Otherwise, review of systems are positive for none.   All other systems are reviewed and negative.    PHYSICAL EXAM: VS:  BP (!) 102/50   Pulse 64   Ht 5\' 11"  (1.803 m)   Wt 91.7 kg (202 lb 3.2 oz)   BMI 28.20 kg/m  , BMI Body mass index is 28.2 kg/m. GENERAL:  Well appearing HEENT: Pupils equal round and reactive, fundi not visualized, oral mucosa unremarkable.  R lower lid edema.  R mandible edema NECK:  No jugular venous distention, waveform within normal limits, carotid upstroke brisk and symmetric, no bruits, no thyromegaly LYMPHATICS:  No cervical adenopathy LUNGS:  Clear to auscultation bilaterally HEART:  RRR.  PMI not displaced or sustained,S1 and S2 within normal limits, no S3, no S4, no clicks, no rubs, no murmurs ABD:  Flat, positive bowel sounds normal in frequency in pitch, no bruits, no rebound, no guarding, no midline pulsatile mass, no hepatomegaly, no splenomegaly EXT:  2 plus pulses throughout, no edema, no cyanosis no clubbing SKIN:  No rashes no nodules NEURO:  Cranial nerves II through XII grossly intact, motor grossly intact throughout PSYCH:  Cognitively intact, oriented to person place and time   EKG:  EKG is not ordered today. The ekg ordered 06/19/16 demonstrates  sinus bradycardia.  LBBB.  LAD.     Recent Labs: 06/19/2016: Hemoglobin 12.9; Platelets 161 10/30/2016: BUN 23; Creatinine, Ser 1.45; Potassium 3.9; Sodium 141    Lipid Panel    Component Value Date/Time   CHOL 127 06/08/2010 1110   TRIG 122.0 06/08/2010 1110   HDL 26.80 (L) 06/08/2010 1110   CHOLHDL 5 06/08/2010 1110   VLDL 24.4 06/08/2010 1110   LDLCALC 76 06/08/2010 1110      Wt Readings from Last 3 Encounters:  12/04/16 91.7 kg (202 lb 3.2 oz)  12/02/16 94.3 kg (208 lb)  10/30/16 95.9 kg (211 lb 6.4 oz)      ASSESSMENT AND PLAN:  # CAD: Mr. Vastine has non-obstructive CAD that is medically managed.  Continue aspirin, nebivolol and atorvastatin.  He is on aspirin 325mg  per neurology for prior stroke.   # Hypertension: Blood pressure is low today and he had an episode of syncope last night.  This is likely 2/2 his infection.  He does not appear toxic and is feeling better.  I have instructed him not to take any more BP medication today.  He will check his BP tomorrow and only take is medicine if his SBP is >130 mmHg.  He will continue to track his BP and we will determine how to adjust his medication once he gets over this illness.   # Hyperlipidemia: Continue atorvastatin.  Current medicines are reviewed at length with the patient today.  The patient does not have concerns regarding medicines.  The following changes have been made:  Stop metoprolol and start carvedilol 25mg    Labs/ tests ordered today include:  No orders of the defined types were placed in this encounter.    Disposition:   FU with [name] C. Oval Linsey, MD, [facility] in 1 week.    This note was written with the assistance of speech recognition software.  Please excuse any transcriptional errors.  Signed, Daronte Shostak C. Oval Linsey, MD, [facility]  12/04/2016 6:17 PM    [facility]

## 2016-12-04 NOTE — Patient Instructions (Addendum)
Medication Instructions:  HOLD ALL YOUR BLOOD PRESSURE MEDICATIONS TODAY  TOMORROW MORNING CHECK YOUR BLOOD PRESSURE AND IF YOUR SYSTOLIC (TOP NUMBER) IS OVER 130 OK TO TAKE YOUR BLOOD PRESSURE MEDICATIONS   Labwork: NONE  Testing/Procedures: NONE  Follow-Up: Your physician recommends that you schedule a follow-up appointment in: IN 1 WEEK TO SEE PHARM D FOR BLOOD PRESSURE/DR Galva/PA  If you need a refill on your cardiac medications before your next appointment, please call your pharmacy.

## 2016-12-05 NOTE — Telephone Encounter (Signed)
Unable to process Bystolic PA secondary to no member being found. Spoke with pharmacy and patient actually needs quantity override, number [phone]. Patient seen in office yesterday and to follow up in 1 week. Depending on appointment next week patient may go back on Coreg. Will follow up next week regarding Bystolic

## 2016-12-10 ENCOUNTER — Ambulatory Visit: Payer: Medicare Other | Attending: Neurosurgery | Admitting: Physical Therapy

## 2016-12-10 DIAGNOSIS — M6281 Muscle weakness (generalized): Secondary | ICD-10-CM | POA: Insufficient documentation

## 2016-12-10 DIAGNOSIS — M62838 Other muscle spasm: Secondary | ICD-10-CM | POA: Insufficient documentation

## 2016-12-10 DIAGNOSIS — M542 Cervicalgia: Secondary | ICD-10-CM | POA: Insufficient documentation

## 2016-12-12 ENCOUNTER — Ambulatory Visit: Payer: Medicare Other | Admitting: Physical Therapy

## 2016-12-13 ENCOUNTER — Ambulatory Visit (INDEPENDENT_AMBULATORY_CARE_PROVIDER_SITE_OTHER): Payer: Medicare Other | Admitting: Cardiovascular Disease

## 2016-12-13 ENCOUNTER — Encounter: Payer: Self-pay | Admitting: Cardiovascular Disease

## 2016-12-13 VITALS — BP 130/68 | HR 65 | Ht 71.0 in | Wt 204.0 lb

## 2016-12-13 DIAGNOSIS — E7800 Pure hypercholesterolemia, unspecified: Secondary | ICD-10-CM

## 2016-12-13 DIAGNOSIS — I10 Essential (primary) hypertension: Secondary | ICD-10-CM

## 2016-12-13 DIAGNOSIS — I2510 Atherosclerotic heart disease of native coronary artery without angina pectoris: Secondary | ICD-10-CM | POA: Diagnosis not present

## 2016-12-13 DIAGNOSIS — I639 Cerebral infarction, unspecified: Secondary | ICD-10-CM | POA: Diagnosis not present

## 2016-12-13 MED ORDER — CARVEDILOL 25 MG PO TABS: 25.0000 mg | ORAL_TABLET | Freq: Two times a day (BID) | ORAL | 5 refills | Status: DC

## 2016-12-13 MED ORDER — HYDRALAZINE HCL 25 MG PO TABS: ORAL_TABLET | ORAL | 5 refills | Status: DC

## 2016-12-13 NOTE — Patient Instructions (Addendum)
Medication Instructions:  STOP BYSTOLIC   START CARVEDILOL 25 MG TWICE A DAY   USE HYDRALAZINE 25 MG 1 TABLET EVERY 6 HOURS AS NEEDED FOR SYSTOLIC BLOOD PRESSURE 751 OR GREATER   Labwork: NONE  Testing/Procedures: NONE  Follow-Up: Your physician recommends that you schedule a follow-up appointment in: 2 MONTH OV  If you need a refill on your cardiac medications before your next appointment, please call your pharmacy.

## 2016-12-13 NOTE — Progress Notes (Signed)
Cardiology Office Note   Date:  12/13/2016   ID:  Jeffery Fernandez, DOB 1945/09/24, MRN [id number]  PCP:  Dione Housekeeper, MD  Cardiologist:   Skeet Latch, MD   Chief Complaint  Patient presents with  . Follow-up      History of Present Illness: Jeffery Fernandez is a 71 y.o. male medically managed CAD, hypertension, hyperlipidemia, diabetes, COPD, LBBB, and retinal artery occlusion who presents for follow-up.  He was initially seen for pre-surgical risk assessment 05/2016 prior to cervical spine surgery.  He was found to be at acceptable risk for surgery.  At that appointment his blood pressure was poorly-controlled.  Metoprolol was switched to carvedilol.  He followed up with our pharmacist and his blood pressure remained poorly-controlled So hydralazine was added to his regimen.  He stopped taking this due to dizziness, though his blood pressure still was not well-controlled.  Losartan was switched to irbesartan.  Carvedilol was switched to nebivolol due to fatigue and terazosin was reduced.  He underwent C3 through 6 anterior cervical discectomy/decompression with insertion of bone graft and interbody prostheses on 05/2016.  He developed an anterior cervical wound hematoma and possible CSF leak.  He underwent reexploration on 5/30 and was discharged home 6/1.    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  He had an echo and exercise Cardiolite that were unremarkable.  He also wore a 21 day event monitor for three weeks and no atrial fibrillation was noted.  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  In retrospect he thinks that this is what was wrong with his right eye.  Jeffery Fernandez was seen  Of shortness of breath in 2012 and was thought to have a COPD exacerbation.  However he didn't have a history of COPD.  He was referred for cardiac catheterization 05/2010, at which time he  was noted to have normal right and left heart filling pressures.  He had 50-60 mid RCA stenosis.  Echo revealed LVEF 55% and was otherwise unremarkable.   At his last appointment Mr. Bourdon' blood pressure had been running very high. He called our office at night and was referred to the ED.  The wait was very long in the ED so he decided to go home.  He subsequently found out that he has a large abscess in his right upper mandible.  He saw his dentist and underwent incision and drainage.  He was started on amoxicillin.  Prior to incision and drainage she also had chills and a temperature of 99.5. The night prior to his appointment he had syncope upon standing.  He continued to be lightheaded and dizzy and his blood pressure was low.  This was thought to be due to his infection and he was instructed to hold his blood pressure medication until his systolic blood pressure was greater than 130.  Jeffery Fernandez brings a log of his blood pressure showing that they have ranged from 120/70 to 160/87.  They have been mostly in the 120s-140s.  He has been feeling much better.  He had several teeth extracted and had dental implants placed on 11/15.  His mouth is still tender   But he has been able to eat and drink.  He has not noted any changes in his fatigue since switching from carvedilol to nebivolol.  He has not yet started  back exercising.  He denies any chest pain or shortness of breath.  He has not noted lower extremity edema, orthopnea, or PND.   Past Medical History:  Diagnosis Date  . Arrhythmia   . Arthritis   . Chest pain, unspecified   . Chronic airway obstruction, not elsewhere classified    PT. DENIES HE HAS COPD  . CKD (chronic kidney disease)   . Colon polyps    Adenomatous Polyps 2007  . Depression   . Diabetes mellitus   . Diverticulosis   . Gout   . Heart murmur   . Hyperlipemia   . Hypertension   . Nephrolithiasis   . Obstructive sleep apnea (adult) (pediatric)   . Other and unspecified  hyperlipidemia   . Other left bundle branch block   . Retinal vascular occlusion, unspecified    right  . Shortness of breath    on exertion  . Unspecified sleep apnea   . Vision loss of right eye     Past Surgical History:  Procedure Laterality Date  . ANTERIOR CERVICAL DECOMPRESSION/DISCECTOMY FUSION, INTERBODY PROSTESIS, PLATE, CERVICAL THREE CERVICAL FOUR, CERVICAL FOUR CERVICAL FIVE CERVICAL SIX N/A 06/26/2016   Performed by Newman Pies, MD at [facility]  . CATARACT EXTRACTION Bilateral   . KIDNEY STONE SURGERY    . Re-Exploration of Cervical Wound N/A 06/26/2016   Performed by Kary Kos, MD at [facility] OR     Current Outpatient Medications  Medication Sig Dispense Refill  . acetaminophen (TYLENOL) 500 MG tablet Take 1,000 mg by mouth every 6 (six) hours as needed (for pain.).    Marland Kitchen acetaminophen-codeine (TYLENOL #3) 300-30 MG tablet Take 1 tablet daily by mouth.  0  . amoxicillin-clavulanate (AUGMENTIN) 875-125 MG tablet Take 1 tablet 2 (two) times daily by mouth.    Marland Kitchen atorvastatin (LIPITOR) 40 MG tablet Take 20 mg by mouth at bedtime.     . carvedilol (COREG) 25 MG tablet Take 1 tablet (25 mg total) 2 (two) times daily by mouth. 60 tablet 5  . glipiZIDE (GLUCOTROL XL) 10 MG 24 hr tablet Take 10 mg by mouth 2 (two) times daily.    . hydrALAZINE (APRESOLINE) 25 MG tablet 1 TABLET EVERY 6 HOURS AS NEEDED FOR SYSTOLIC BLOOD PRESSURE 756 OR GREATER 60 tablet 5  . irbesartan-hydrochlorothiazide (AVALIDE) 300-12.5 MG tablet Take 1 tablet by mouth daily. Take place of losartan 30 tablet 5  . Ketotifen Fumarate (ITCHY EYE DROPS OP) Place 1 drop into both eyes daily as needed (itching/allergies).     . metFORMIN (GLUCOPHAGE) 500 MG tablet Take 1,000 mg by mouth 2 (two) times daily with a meal.     . pantoprazole (PROTONIX) 40 MG tablet Take 40 mg by mouth daily.     . sertraline (ZOLOFT) 100 MG tablet Take 100 mg daily by mouth.     . sitaGLIPtan (JANUVIA) 100 MG tablet Take 100 mg by mouth  daily.      Marland Kitchen terazosin (HYTRIN) 2 MG capsule Take 1 capsule (2 mg total) by mouth at bedtime. 30 capsule 4   No current facility-administered medications for this visit.     Allergies:   Penicillins    Social History:  The patient  reports that he quit smoking about 30 years ago. His smoking use included cigarettes. He has a 37.50 pack-year smoking history. he has never used smokeless tobacco. He reports that he does not drink alcohol or use drugs.   Family History:  The patient's family history  includes Arthritis/Rheumatoid in his cousin; Atrial fibrillation in his brother; Diabetes in his other; Heart attack (age of onset: 22) in his brother; Heart disease in his father; Parkinson's disease in his brother; Stroke in his father; Uterine cancer in his mother.    ROS:  Please see the history of present illness.   Otherwise, review of systems are positive for none.   All other systems are reviewed and negative.    PHYSICAL EXAM: VS:  BP 130/68   Pulse 65   Ht 5\' 11"  (1.803 m)   Wt 92.5 kg (204 lb)   BMI 28.45 kg/m  , BMI Body mass index is 28.45 kg/m. GENERAL:  Well appearing HEENT: Pupils equal round and reactive, fundi not visualized, oral mucosa unremarkable NECK:  No jugular venous distention, waveform within normal limits, carotid upstroke brisk and symmetric, no bruits, no thyromegaly LUNGS:  Clear to auscultation bilaterally HEART:  RRR.  PMI not displaced or sustained,S1 and S2 within normal limits, no S3, no S4, no clicks, no rubs, no murmurs ABD:  Flat, positive bowel sounds normal in frequency in pitch, no bruits, no rebound, no guarding, no midline pulsatile mass, no hepatomegaly, no splenomegaly EXT:  2 plus pulses throughout, no edema, no cyanosis no clubbing SKIN:  No rashes no nodules NEURO:  Cranial nerves II through XII grossly intact, motor grossly intact throughout PSYCH:  Cognitively intact, oriented to person place and time   EKG:  EKG is not ordered  today. The ekg ordered 06/19/16 demonstrates sinus bradycardia.  LBBB.  LAD.     Recent Labs: 06/19/2016: Hemoglobin 12.9; Platelets 161 10/30/2016: BUN 23; Creatinine, Ser 1.45; Potassium 3.9; Sodium 141    Lipid Panel    Component Value Date/Time   CHOL 127 06/08/2010 1110   TRIG 122.0 06/08/2010 1110   HDL 26.80 (L) 06/08/2010 1110   CHOLHDL 5 06/08/2010 1110   VLDL 24.4 06/08/2010 1110   LDLCALC 76 06/08/2010 1110   03/26/16: Total cholesterol 129, triglycerides 140, HDL 33, LDL 68   Wt Readings from Last 3 Encounters:  12/13/16 92.5 kg (204 lb)  12/04/16 91.7 kg (202 lb 3.2 oz)  12/02/16 94.3 kg (208 lb)      ASSESSMENT AND PLAN:  # CAD: Jeffery Fernandez has non-obstructive CAD that is medically managed.  Continue aspirin and atorvastatin.  He is on aspirin 325mg  per neurology for prior stroke.  Switch nebivolol to carvedilol since he hasn't noted improvement in fatigue.   # Hypertension: Blood pressure is better but still labile.  For the last two days it has been 242-353 systolic.  Continue irbesartan and hydrochlorothiazide.  Switch nebivolol to carvedilol 25 mg twice daily as above.  We will also give him hydralazine 25 mg to be taken as needed every 6 hours for SBP greater than 160.    # Hyperlipidemia: Continue atorvastatin.  LDL was 68 02/2016.   Current medicines are reviewed at length with the patient today.  The patient does not have concerns regarding medicines.  The following changes have been made:  Stop nebivolol and start carvedilol 25mg    Labs/ tests ordered today include:  No orders of the defined types were placed in this encounter.    Disposition:   FU with Adayah Arocho C. Oval Linsey, MD, [facility] in 2 months.     Signed, Orlanda Lemmerman C. Oval Linsey, MD, [facility]  12/13/2016 12:34 PM    [place]

## 2016-12-16 ENCOUNTER — Ambulatory Visit: Payer: Medicare Other | Admitting: Physical Therapy

## 2016-12-18 ENCOUNTER — Ambulatory Visit: Payer: Medicare Other | Admitting: Physical Therapy

## 2016-12-18 ENCOUNTER — Encounter: Payer: Self-pay | Admitting: Physical Therapy

## 2016-12-18 DIAGNOSIS — M62838 Other muscle spasm: Secondary | ICD-10-CM

## 2016-12-18 DIAGNOSIS — M542 Cervicalgia: Secondary | ICD-10-CM

## 2016-12-18 DIAGNOSIS — M6281 Muscle weakness (generalized): Secondary | ICD-10-CM

## 2016-12-19 NOTE — Therapy (Signed)
[facility] Apr 30, [address], [place], [address] Phone: [phone]   Fax:  [phone]  Physical Therapy Treatment  Patient Details  Name: Jeffery Fernandez MRN: [id number] Date of Birth: 08-Apr-1945 Referring Provider: Ophelia Charter MD   Encounter Date: 12/18/2016  PT End of Session - 12/18/16 1545    Visit Number  2    Number of Visits  12    Date for PT Re-Evaluation  01/08/17    Authorization Type  2    Authorization Time Period  10 G codes    PT Start Time  1502-05-01    PT Stop Time  1545    PT Time Calculation (min)  41 min    Activity Tolerance  Patient tolerated treatment well    Behavior During Therapy  [facility] for tasks assessed/performed       Past Medical History:  Diagnosis Date  . Arrhythmia   . Arthritis   . Chest pain, unspecified   . Chronic airway obstruction, not elsewhere classified    PT. DENIES HE HAS COPD  . CKD (chronic kidney disease)   . Colon polyps    Adenomatous Polyps 04/30/2005  . Depression   . Diabetes mellitus   . Diverticulosis   . Gout   . Heart murmur   . Hyperlipemia   . Hypertension   . Nephrolithiasis   . Obstructive sleep apnea (adult) (pediatric)   . Other and unspecified hyperlipidemia   . Other left bundle branch block   . Retinal vascular occlusion, unspecified    right  . Shortness of breath    on exertion  . Unspecified sleep apnea   . Vision loss of right eye     Past Surgical History:  Procedure Laterality Date  . ANTERIOR CERVICAL DECOMP/DISCECTOMY FUSION N/A 06/26/2016   Procedure: Re-Exploration of Cervical Wound;  Surgeon: Kary Kos, MD;  Location: [facility];  Service: Neurosurgery;  Laterality: N/A;  . ANTERIOR CERVICAL DECOMP/DISCECTOMY FUSION N/A 06/26/2016   Procedure: ANTERIOR CERVICAL DECOMPRESSION/DISCECTOMY FUSION, INTERBODY PROSTESIS, PLATE, CERVICAL THREE CERVICAL FOUR, CERVICAL FOUR CERVICAL FIVE CERVICAL SIX;  Surgeon: Newman Pies, MD;   Location: [facility];  Service: Neurosurgery;  Laterality: N/A;  . CATARACT EXTRACTION Bilateral   . KIDNEY STONE SURGERY      There were no vitals filed for this visit.  Subjective Assessment - 12/18/16 1504    Subjective  Patient reports bilateral UE weakness and right UE weaker than left; unable to raise arm above shoulder without substitution and deviations.    Pertinent History  history of neck pain with progressive weakness in right UE that began around 09/2015. He had PT prior to surgery with mixed results and underwent surgery for cervical spine decomprression with fusion 06/26/2016. Since surgery he has been self managing healing at home. Overall he has noticed less pain in neck and head and improved strength in right LE. He continues with weakness in both UE's and back.    Limitations  Lifting;House hold activities;Other (comment) holding grandchildren    Patient Stated Goals  improve upper body strength to be able to play with grandchildren     Currently in Pain?  No/denies        Objective:  AROM: right UE decreased forward elevation with deviations noted with 90 degrees and above; decreased ability to perform supination right UE Strength: right UE biceps 3+/5, supination 3/5; forward elevation shoulder at least 3+/5  Treatment:  Therapeutic exercise: patient  performed with instruction, verbal cues and demonstration Sitting:  Right UE supination with 1# weight x 10 Right UE elbow flexion x 5 with isometric hold end range with small ball hold 2-3 seconds Supine lying: Right UE flexed to 90 degrees; small circles clockwise/counterclockwise x 10 Forward flexion x 10 through 90 degrees x 10 without deviations  Modalities: Electrical stimulation: 15 min.Russian stim. 10/10 cycle applied (2) electrodes to right biceps with patient supine with right UE supported on pillow; pt.  goal muscle re education  Patient response to treatment: Patient demonstrated improved technique with  exercises with moderate cuing and demonstration with VC for correct alignment. Improved motor control with repetition and cuing. Improved biceps control following estim.     PT Education - 12/18/16 1537    Education provided  Yes    Education Details  exercise instruction for home exercises    Person(s) Educated  Patient    Methods  Explanation;Demonstration;Tactile cues;Verbal cues;Handout    Comprehension  Verbalized understanding;Returned demonstration;Verbal cues required          PT Long Term Goals - 11/27/16 1952      PT LONG TERM GOAL #1   Title  Patient will be independent in home exercise program by discharge to improve strength/mobility for improved right UE use for yard work and playing with grandchildren     Baseline  limited knowledge of appropriate exercises and progression without guidance and moderate cuing/demonstration    Status  New    Target Date  01/08/17      PT LONG TERM GOAL #2   Title  improve NDI to <15% demonstrating improved function with ADL's with less difficulty     Baseline  NDI 26%    Status  New    Target Date  12/25/16      PT LONG TERM GOAL #3   Title  Patient will demonstrate improved strength right UE elbow flexion to at least 3+/5 by discharge to allow continued exercise and be able to perform light activties with minimal to no difficulty     Baseline  right elbow flexoin 3-/5    Status  New    Target Date  01/08/17            Plan - 12/18/16 1545    Clinical Impression Statement  Patient demonstrated improved strength and motor control with treatment russian stim. and exercise instruction. He should continue to benefit from continued physical therapy intervention to imrpove strength and function with UE's.     Rehab Potential  Fair    PT Frequency  2x / week    PT Duration  6 weeks    PT Treatment/Interventions  Electrical Stimulation;Patient/family education;Neuromuscular re-education;Therapeutic exercise;Manual techniques     PT Next Visit Plan  modalities for muscle re education right UE biceps, manual therapy for spasm in upper traps/cervical spine; therapeutic exercises for strength right UE, upper back    PT Home Exercise Plan  posture awareness, scapular retraction, active right elbow flexion daily and light weights 1-3# 3x/week       Patient will benefit from skilled therapeutic intervention in order to improve the following deficits and impairments:  Decreased strength, Decreased activity tolerance, Impaired perceived functional ability, Impaired UE functional use, Increased muscle spasms, Decreased endurance  Visit Diagnosis: Muscle weakness (generalized)  Other muscle spasm  Cervicalgia     Problem List Patient Active Problem List   Diagnosis Date Noted  . Cervical spondylosis with myelopathy and radiculopathy 06/26/2016  . Spinal  stenosis of cervical region 06/26/2016  . Personal history of colonic polyps 06/14/2011  . CAD (coronary artery disease) 07/10/2010  . OSA (obstructive sleep apnea) 06/10/2010  . Hypertension 06/10/2010  . COPD (chronic obstructive pulmonary disease) ([place]) 06/10/2010  . Hyperlipidemia 06/10/2010  . Retinal artery occlusion 06/10/2010    Jomarie Longs PT 12/19/2016, 6:48 PM  [facility] [address], [place], [address] Phone: [phone]   Fax:  [phone]  Name: Jeffery Fernandez MRN: [id number] Date of Birth: December 28, 1945

## 2016-12-23 ENCOUNTER — Ambulatory Visit: Payer: Medicare Other

## 2016-12-24 ENCOUNTER — Encounter: Payer: Self-pay | Admitting: Physical Therapy

## 2016-12-24 ENCOUNTER — Ambulatory Visit: Payer: Medicare Other | Admitting: Physical Therapy

## 2016-12-24 DIAGNOSIS — M6281 Muscle weakness (generalized): Secondary | ICD-10-CM | POA: Diagnosis not present

## 2016-12-24 DIAGNOSIS — M62838 Other muscle spasm: Secondary | ICD-10-CM

## 2016-12-24 DIAGNOSIS — M542 Cervicalgia: Secondary | ICD-10-CM

## 2016-12-24 NOTE — Therapy (Signed)
[facility] [address], [place], [address] Phone: [phone]   Fax:  [phone]  Physical Therapy Treatment  Patient Details  Name: Jeffery Fernandez MRN: [id number] Date of Birth: [birth year] Referring Provider: Ophelia Charter MD   Encounter Date: 12/24/2016  PT End of Session - 12/24/16 1036    Visit Number  3    Number of Visits  12    Date for PT Re-Evaluation  01/08/17    Authorization Type  3    Authorization Time Period  10 G codes    PT Start Time  04/23/30    PT Stop Time  1115    PT Time Calculation (min)  43 min    Activity Tolerance  Patient tolerated treatment well    Behavior During Therapy  [facility] for tasks assessed/performed       Past Medical History:  Diagnosis Date  . Arrhythmia   . Arthritis   . Chest pain, unspecified   . Chronic airway obstruction, not elsewhere classified    PT. DENIES HE HAS COPD  . CKD (chronic kidney disease)   . Colon polyps    Adenomatous Polyps 04-22-2005  . Depression   . Diabetes mellitus   . Diverticulosis   . Gout   . Heart murmur   . Hyperlipemia   . Hypertension   . Nephrolithiasis   . Obstructive sleep apnea (adult) (pediatric)   . Other and unspecified hyperlipidemia   . Other left bundle branch block   . Retinal vascular occlusion, unspecified    right  . Shortness of breath    on exertion  . Unspecified sleep apnea   . Vision loss of right eye     Past Surgical History:  Procedure Laterality Date  . ANTERIOR CERVICAL DECOMP/DISCECTOMY FUSION N/A 06/26/2016   Procedure: Re-Exploration of Cervical Wound;  Surgeon: Kary Kos, MD;  Location: [place];  Service: Neurosurgery;  Laterality: N/A;  . ANTERIOR CERVICAL DECOMP/DISCECTOMY FUSION N/A 06/26/2016   Procedure: ANTERIOR CERVICAL DECOMPRESSION/DISCECTOMY FUSION, INTERBODY PROSTESIS, PLATE, CERVICAL THREE CERVICAL FOUR, CERVICAL FOUR CERVICAL FIVE CERVICAL SIX;  Surgeon: Newman Pies, MD;   Location: [place];  Service: Neurosurgery;  Laterality: N/A;  . CATARACT EXTRACTION Bilateral   . KIDNEY STONE SURGERY      There were no vitals filed for this visit.  Subjective Assessment - 12/24/16 1034    Subjective  patient reports soreness in right UE biceps/ forearm today. He has been exercising as instructed and picking grandchildren    Pertinent History  history of neck pain with progressive weakness in right UE that began around 09/2015. He had PT prior to surgery with mixed results and underwent surgery for cervical spine decomprression with fusion 06/26/2016. Since surgery he has been self managing healing at home. Overall he has noticed less pain in neck and head and improved strength in right LE. He continues with weakness in both UE's and back.    Limitations  Lifting;House hold activities;Other (comment) holding grandchildren    Patient Stated Goals  improve upper body strength to be able to play with grandchildren     Currently in Pain?  Other (Comment) soreness right UE biceps and forearm        Objective:  AROM: right UE shoulder forward elevation WNL; elbow flexion decreased with increased effort from full ROM; unable to perform elbow flexion without difficulty with right UE at 90 degrees forward elevation or abduction Strength: right UE  biceps 3+/5, supination 3/5; forward elevation shoulder at least 3+/5  Treatment:  Therapeutic exercise: patient performed with instruction, verbal cues and demonstration Supine lying: Right UE flexed to 90 degrees; small circles clockwise/counterclockwise x 10 Right UE 90 degrees elevation rhythmic stabilization 3 x 10 reps Elbow flexion with 1#, 2# dumbbells through short arc for controlled motion x 10 each Assisted forearm supination, elbow flexion, shoulder flexion x 5 reps  Modalities: Electrical stimulation: 15 min.Russian stim. 10/10 cycle applied (2) electrodes to right biceps and (2) electrodes to brachialis with patient  sitting with right UE supported on pillow; pt.  goal muscle re education  Patient response to treatment: Improved right elbow flexion with shoulder at 90 degrees forward elevation and abduction with improved control following estim. Patient required assistance and moderate VC to perform exercise with improved technique and control.      PT Education - 12/24/16 1045    Education provided  Yes    Education Details  exercise instruction    Person(s) Educated  Patient    Methods  Explanation;Demonstration;Verbal cues    Comprehension  Verbalized understanding;Returned demonstration;Verbal cues required          PT Long Term Goals - 11/27/16 1952      PT LONG TERM GOAL #1   Title  Patient will be independent in home exercise program by discharge to improve strength/mobility for improved right UE use for yard work and playing with grandchildren     Baseline  limited knowledge of appropriate exercises and progression without guidance and moderate cuing/demonstration    Status  New    Target Date  01/08/17      PT LONG TERM GOAL #2   Title  improve NDI to <15% demonstrating improved function with ADL's with less difficulty     Baseline  NDI 26%    Status  New    Target Date  12/25/16      PT LONG TERM GOAL #3   Title  Patient will demonstrate improved strength right UE elbow flexion to at least 3+/5 by discharge to allow continued exercise and be able to perform light activties with minimal to no difficulty     Baseline  right elbow flexoin 3-/5    Status  New    Target Date  01/08/17            Plan - 12/24/16 1100    Clinical Impression Statement  Patient able to perform right elbow flexion with increased control and less strain following treatment with estim. He is progressing with exercises with moderate guidance, instruction and VC. He should continue improvement with additional physical therapy intervention.     Rehab Potential  Fair    PT Frequency  2x / week    PT  Duration  6 weeks    PT Treatment/Interventions  Electrical Stimulation;Patient/family education;Neuromuscular re-education;Therapeutic exercise;Manual techniques    PT Next Visit Plan  modalities for muscle re education right UE biceps, manual therapy for spasm in upper traps/cervical spine; therapeutic exercises for strength right UE, upper back    PT Home Exercise Plan  posture awareness, scapular retraction, active right elbow flexion daily and light weights 1-3# 3x/week       Patient will benefit from skilled therapeutic intervention in order to improve the following deficits and impairments:  Decreased strength, Decreased activity tolerance, Impaired perceived functional ability, Impaired UE functional use, Increased muscle spasms, Decreased endurance  Visit Diagnosis: Muscle weakness (generalized)  Other muscle spasm  Cervicalgia  Problem List Patient Active Problem List   Diagnosis Date Noted  . Cervical spondylosis with myelopathy and radiculopathy 06/26/2016  . Spinal stenosis of cervical region 06/26/2016  . Personal history of colonic polyps 06/14/2011  . CAD (coronary artery disease) 07/10/2010  . OSA (obstructive sleep apnea) 06/10/2010  . Hypertension 06/10/2010  . COPD (chronic obstructive pulmonary disease) ([place]) 06/10/2010  . Hyperlipidemia 06/10/2010  . Retinal artery occlusion 06/10/2010    Jomarie Longs PT 12/24/2016, 11:12 PM  [facility] [address], [place], [address] Phone: [phone]   Fax:  [phone]  Name: Jeffery Fernandez MRN: [id number] Date of Birth: 10-06-1945

## 2016-12-26 ENCOUNTER — Encounter: Payer: Self-pay | Admitting: Physical Therapy

## 2016-12-26 ENCOUNTER — Ambulatory Visit: Payer: Medicare Other | Admitting: Physical Therapy

## 2016-12-26 DIAGNOSIS — M542 Cervicalgia: Secondary | ICD-10-CM

## 2016-12-26 DIAGNOSIS — M6281 Muscle weakness (generalized): Secondary | ICD-10-CM | POA: Diagnosis not present

## 2016-12-26 DIAGNOSIS — M62838 Other muscle spasm: Secondary | ICD-10-CM

## 2016-12-26 NOTE — Therapy (Signed)
[facility] [id number] [place], [address] Phone: [phone]   Fax:  [phone]  Physical Therapy Treatment  Patient Details  Name: Jeffery Fernandez MRN: [id number] Date of Birth: 08/27/1945 Referring Provider: Ophelia Charter MD   Encounter Date: 12/26/2016  PT End of Session - 12/26/16 1025    Visit Number  4    Number of Visits  12    Date for PT Re-Evaluation  01/08/17    Authorization Type  4    Authorization Time Period  10 G codes    PT Start Time  04/21/20    PT Stop Time  1110    PT Time Calculation (min)  48 min    Activity Tolerance  Patient tolerated treatment well    Behavior During Therapy  [facility] for tasks assessed/performed       Past Medical History:  Diagnosis Date  . Arrhythmia   . Arthritis   . Chest pain, unspecified   . Chronic airway obstruction, not elsewhere classified    PT. DENIES HE HAS COPD  . CKD (chronic kidney disease)   . 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 Depression   . Diabetes mellitus   . Diverticulosis   . Gout   . Heart murmur   . Hyperlipemia   . Hypertension   . Nephrolithiasis   . Obstructive sleep apnea (adult) (pediatric)   . Other and unspecified hyperlipidemia   . Other left bundle branch block   . Retinal vascular occlusion, unspecified    right  . Shortness of breath    on exertion  . Unspecified sleep apnea   . Vision loss of right eye     Past Surgical History:  Procedure Laterality Date  . ANTERIOR CERVICAL DECOMP/DISCECTOMY FUSION N/A 06/26/2016   Procedure: Re-Exploration of Cervical Wound;  Surgeon: Kary Kos, MD;  Location: [place];  Service: Neurosurgery;  Laterality: N/A;  . ANTERIOR CERVICAL DECOMP/DISCECTOMY FUSION N/A 06/26/2016   Procedure: ANTERIOR CERVICAL DECOMPRESSION/DISCECTOMY FUSION, INTERBODY PROSTESIS, PLATE, CERVICAL THREE CERVICAL FOUR, CERVICAL FOUR CERVICAL FIVE CERVICAL SIX;  Surgeon: Newman Pies, MD;   Location: [facility];  Service: Neurosurgery;  Laterality: N/A;  . CATARACT EXTRACTION Bilateral   . KIDNEY STONE SURGERY      There were no vitals filed for this visit.  Subjective Assessment - 12/26/16 1024    Subjective  Patient reports soreness in right UE, seems to be improving some.     Pertinent History  history of neck pain with progressive weakness in right UE that began around 09/2015. He had PT prior to surgery with mixed results and underwent surgery for cervical spine decomprression with fusion 06/26/2016. Since surgery he has been self managing healing at home. Overall he has noticed less pain in neck and head and improved strength in right LE. He continues with weakness in both UE's and back.    Limitations  Lifting;House hold activities;Other (comment) holding grandchildren    Patient Stated Goals  improve upper body strength to be able to play with grandchildren     Currently in Pain?  Other (Comment) soreness in right UE         Objective:  AROM: right UE shoulder forward elevation WNL; elbow flexion decreased with increased effort from full ROM; unable to perform elbow flexion without difficulty with right UE at 90 degrees forward elevation or abduction; all motions improved control from previous session  Treatment:  Therapeutic exercise:patient performed with instruction, verbal cues, tactile cues and demonstration of therapist: goal: improve strength, quick Dash, independent with home exercises Sitting:  2# dumbbell elbow flexion through short arc 90 to full ROM 2 x 10  OMEGA seated rows bilateral 5# x 10, single arm right short arc 5# x 10; left full ROM x 10 with 5#  Standing:  Bilateral rows short arc red theraband with tactile and verbal cuing x 10   Supine lying: Right UE 90 degrees elevation rhythmic stabilization 3 x 10 reps Forearm manual resistive pronation/supination x 10 reps  Side lying left; performed with RIGHT shoulder  Tactile and verbal cuing  required: shoulder elevation and depression x 10 and scapular protraction/retraction x 10  Assisted forearm supination, elbow flexion, shoulder flexion x 5 reps  Modalities: Electrical stimulation:15 min.Russian stim. 10/10 cycle, intensity to contraction, applied (2) electrodes toright biceps (42ma) and (2) electrodes to brachialis (24ma)and periscapular muscles rhomboids and lower trapezius muscles (25ma) with patient sittingwithright UE supported on pillow; pt. goal muscle re education  Patient response to treatment:Improved strength, motor control with exercises with repetition and moderate verbal and tactile cuing to perform with correct alignment. Improved scapular control with exercises with moderate cuing and following estim.            PT Education - 12/26/16 1115    Education provided  Yes    Education Details  exercise instruction with home program: scapular retraction with red band short arc, biceps curls seated short arc, side lying scapular control      Person(s) Educated  Patient    Methods  Explanation;Demonstration;Tactile cues;Verbal cues;Handout    Comprehension  Verbalized understanding;Returned demonstration;Verbal cues required;Tactile cues required;Need further instruction          PT Long Term Goals - 11/27/16 1952      PT LONG TERM GOAL #1   Title  Patient will be independent in home exercise program by discharge to improve strength/mobility for improved right UE use for yard work and playing with grandchildren     Baseline  limited knowledge of appropriate exercises and progression without guidance and moderate cuing/demonstration    Status  New    Target Date  01/08/17      PT LONG TERM GOAL #2   Title  improve NDI to <15% demonstrating improved function with ADL's with less difficulty     Baseline  NDI 26%    Status  New    Target Date  12/25/16      PT LONG TERM GOAL #3   Title  Patient will demonstrate improved strength right UE  elbow flexion to at least 3+/5 by discharge to allow continued exercise and be able to perform light activties with minimal to no difficulty     Baseline  right elbow flexoin 3-/5    Status  New    Target Date  01/08/17            Plan - 12/26/16 1026    Clinical Impression Statement  Patient demonstrates improvement with strength in biceps and forearm right UE. He is responding well to electrical stimulation with good carry over between sessions. He should continue to improve with additional physical therapy intervention.    Rehab Potential  Fair    PT Frequency  2x / week    PT Duration  6 weeks    PT Treatment/Interventions  Electrical Stimulation;Patient/family education;Neuromuscular re-education;Therapeutic exercise;Manual techniques    PT Next Visit Plan  modalities for muscle re  education right UE biceps, manual therapy for spasm in upper traps/cervical spine; therapeutic exercises for strength right UE, upper back    PT Home Exercise Plan  posture awareness, scapular retraction, active right elbow flexion daily and light weights 1-3# 3x/week       Patient will benefit from skilled therapeutic intervention in order to improve the following deficits and impairments:  Decreased strength, Decreased activity tolerance, Impaired perceived functional ability, Impaired UE functional use, Increased muscle spasms, Decreased endurance  Visit Diagnosis: Muscle weakness (generalized)  Other muscle spasm  Cervicalgia     Problem List Patient Active Problem List   Diagnosis Date Noted  . Cervical spondylosis with myelopathy and radiculopathy 06/26/2016  . Spinal stenosis of cervical region 06/26/2016  . Personal history of colonic polyps 06/14/2011  . CAD (coronary artery disease) 07/10/2010  . OSA (obstructive sleep apnea) 06/10/2010  . Hypertension 06/10/2010  . COPD (chronic obstructive pulmonary disease) ([place]) 06/10/2010  . Hyperlipidemia 06/10/2010  . Retinal artery  occlusion 06/10/2010    Jomarie Longs PT 12/26/2016, 11:18 AM  [facility] [address], [place], [address] Phone: [phone]   Fax:  [phone]  Name: STEPEN PRINS MRN: [id number] Date of Birth: 1945-11-14

## 2016-12-30 ENCOUNTER — Encounter: Payer: Self-pay | Admitting: Physical Therapy

## 2016-12-30 ENCOUNTER — Ambulatory Visit: Payer: Medicare Other | Attending: Neurosurgery | Admitting: Physical Therapy

## 2016-12-30 DIAGNOSIS — M6281 Muscle weakness (generalized): Secondary | ICD-10-CM | POA: Insufficient documentation

## 2016-12-30 DIAGNOSIS — M542 Cervicalgia: Secondary | ICD-10-CM | POA: Insufficient documentation

## 2016-12-30 DIAGNOSIS — M62838 Other muscle spasm: Secondary | ICD-10-CM | POA: Insufficient documentation

## 2016-12-30 NOTE — Therapy (Signed)
[facility] [address], [place], [address] Phone: [phone]   Fax:  [phone]  Physical Therapy Treatment  Patient Details  Name: Jeffery Fernandez MRN: [id number] Date of Birth: 11-Dec-1945 Referring Provider: Ophelia Charter MD   Encounter Date: 12/30/2016  PT End of Session - 12/30/16 1442    Visit Number  5    Number of Visits  12    Date for PT Re-Evaluation  01/08/17    Authorization Type  5    Authorization Time Period  10 G codes    PT Start Time  6962    PT Stop Time  1517    PT Time Calculation (min)  45 min    Activity Tolerance  Patient tolerated treatment well    Behavior During Therapy  [facility] for tasks assessed/performed       Past Medical History:  Diagnosis Date  . Arrhythmia   . Arthritis   . Chest pain, unspecified   . Chronic airway obstruction, not elsewhere classified    PT. DENIES HE HAS COPD  . CKD (chronic kidney disease)   . Colon polyps    Adenomatous Polyps 2007  . Depression   . Diabetes mellitus   . Diverticulosis   . Gout   . Heart murmur   . Hyperlipemia   . Hypertension   . Nephrolithiasis   . Obstructive sleep apnea (adult) (pediatric)   . Other and unspecified hyperlipidemia   . Other left bundle branch block   . Retinal vascular occlusion, unspecified    right  . Shortness of breath    on exertion  . Unspecified sleep apnea   . Vision loss of right eye     Past Surgical History:  Procedure Laterality Date  . ANTERIOR CERVICAL DECOMP/DISCECTOMY FUSION N/A 06/26/2016   Procedure: Re-Exploration of Cervical Wound;  Surgeon: Kary Kos, MD;  Location: [facility];  Service: Neurosurgery;  Laterality: N/A;  . ANTERIOR CERVICAL DECOMP/DISCECTOMY FUSION N/A 06/26/2016   Procedure: ANTERIOR CERVICAL DECOMPRESSION/DISCECTOMY FUSION, INTERBODY PROSTESIS, PLATE, CERVICAL THREE CERVICAL FOUR, CERVICAL FOUR CERVICAL FIVE CERVICAL SIX;  Surgeon: Newman Pies, MD;   Location: [place];  Service: Neurosurgery;  Laterality: N/A;  . CATARACT EXTRACTION Bilateral   . KIDNEY STONE SURGERY      There were no vitals filed for this visit.  Subjective Assessment - 12/30/16 1437    Subjective  Patient reports he is using 2# weight at home with exercises for biceps curls and is still having soreness in his right elbow.    Pertinent History  history of neck pain with progressive weakness in right UE that began around 09/2015. He had PT prior to surgery with mixed results and underwent surgery for cervical spine decomprression with fusion 06/26/2016. Since surgery he has been self managing healing at home. Overall he has noticed less pain in neck and head and improved strength in right LE. He continues with weakness in both UE's and back.    Limitations  Lifting;House hold activities;Other (comment) holding grandchildren    Patient Stated Goals  improve upper body strength to be able to play with grandchildren     Currently in Pain?  Other (Comment) soreness right elbow at insertion of biceps         Objective:  AROM:right UE shoulder forward elevation WNL; elbow flexion decreased with increased effort from full ROM; improved control with  elbow flexion with mild difficulty with right UE at 90  degrees forward elevation; all motions improved control from previous session  Treatment:  Therapeutic exercise:patient performed with instruction, verbal cues, tactile cues and demonstration of therapist: goal: improve strength, quick Dash, independent with home exercises Sitting:  Scapular retraction x 15 with tactile cues for correct alignment and technique for right side OMEGA seated rows bilateral 5# x 10, single arm right short arc 5# x 10; left full ROM x 10 with 10#  Standing:  Bilateral rows green tubing with tactile and verbal cuing x 10   Side lying left; performed with RIGHT shoulder  Tactile and verbal cuing required: shoulder elevation and depression x 10  and scapular protraction/retraction x 10 Shoulder ER x 15 with controlled motion and scapular control  Assisted forearm supination, elbow flexion, shoulder flexion x 5 reps  Modalities: Electrical stimulation:15 min.Russian stim. 10/10 cycle, intensity to contraction, applied (2) electrodes toright biceps (40ma)and (2) electrodes to brachialis (54ma)and periscapular muscles rhomboids and lower trapezius muscles (17ma) with patientsittingwithright UE supported on pillow; pt. goal muscle re education Moist heat pack applied to right arm biceps and forearm in conjunction with estim.: goal: pain, spasms, no adverse reaction noted  Patient response to treatment:Improved technique and correct muscle activation with moderate VC and tactile cuing.      PT Education - 12/30/16 1440    Education provided  Yes    Education Details  exercise instruction with side lying motor control of periscapular muscles, re assessed home exercises    Person(s) Educated  Patient    Methods  Explanation;Demonstration;Verbal cues    Comprehension  Verbalized understanding;Verbal cues required;Returned demonstration          PT Long Term Goals - 11/27/16 1952      PT LONG TERM GOAL #1   Title  Patient will be independent in home exercise program by discharge to improve strength/mobility for improved right UE use for yard work and playing with grandchildren     Baseline  limited knowledge of appropriate exercises and progression without guidance and moderate cuing/demonstration    Status  New    Target Date  01/08/17      PT LONG TERM GOAL #2   Title  improve NDI to <15% demonstrating improved function with ADL's with less difficulty     Baseline  NDI 26%    Status  New    Target Date  12/25/16      PT LONG TERM GOAL #3   Title  Patient will demonstrate improved strength right UE elbow flexion to at least 3+/5 by discharge to allow continued exercise and be able to perform light activties with  minimal to no difficulty     Baseline  right elbow flexoin 3-/5    Status  New    Target Date  01/08/17            Plan - 12/30/16 1649    Clinical Impression Statement  Patient continues to progress with improving motor control right UE biceps and is responding well to electrical stimulation with good carry over between sessions.  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    Rehab Potential  Fair    PT Frequency  2x / week    PT Duration  6 weeks    PT Treatment/Interventions  Electrical Stimulation;Patient/family education;Neuromuscular re-education;Therapeutic exercise;Manual techniques    PT Next Visit Plan  modalities for muscle re education right UE biceps, manual therapy  for spasm in upper traps/cervical spine; therapeutic exercises for strength right UE, upper back    PT Home Exercise Plan  posture awareness, scapular retraction, active right elbow flexion daily and light weights 1-3# 3x/week       Patient will benefit from skilled therapeutic intervention in order to improve the following deficits and impairments:  Decreased strength, Decreased activity tolerance, Impaired perceived functional ability, Impaired UE functional use, Increased muscle spasms, Decreased endurance  Visit Diagnosis: Muscle weakness (generalized)  Other muscle spasm  Cervicalgia     Problem List Patient Active Problem List   Diagnosis Date Noted  . Cervical spondylosis with myelopathy and radiculopathy 06/26/2016  . Spinal stenosis of cervical region 06/26/2016  . Personal history of colonic polyps 06/14/2011  . CAD (coronary artery disease) 07/10/2010  . OSA (obstructive sleep apnea) 06/10/2010  . Hypertension 06/10/2010  . COPD (chronic obstructive pulmonary disease) ([place]) 06/10/2010  . Hyperlipidemia 06/10/2010  . Retinal artery occlusion 06/10/2010    Jomarie Longs  PT 12/30/2016, 4:51 PM  [facility] [address] [place], [address] Phone: [phone]   Fax:  [phone]  Name: Jeffery Fernandez MRN: [id number] Date of Birth: 12-19-1945

## 2017-01-01 ENCOUNTER — Encounter: Payer: Self-pay | Admitting: Cardiovascular Disease

## 2017-01-01 ENCOUNTER — Ambulatory Visit (INDEPENDENT_AMBULATORY_CARE_PROVIDER_SITE_OTHER): Payer: Medicare Other | Admitting: Cardiovascular Disease

## 2017-01-01 VITALS — BP 146/68 | HR 64 | Ht 70.0 in | Wt 204.4 lb

## 2017-01-01 DIAGNOSIS — I2510 Atherosclerotic heart disease of native coronary artery without angina pectoris: Secondary | ICD-10-CM

## 2017-01-01 DIAGNOSIS — I639 Cerebral infarction, unspecified: Secondary | ICD-10-CM | POA: Diagnosis not present

## 2017-01-01 DIAGNOSIS — I10 Essential (primary) hypertension: Secondary | ICD-10-CM | POA: Diagnosis not present

## 2017-01-01 MED ORDER — TERAZOSIN HCL 1 MG PO CAPS: 1.0000 mg | ORAL_CAPSULE | Freq: Every day | ORAL | 3 refills | Status: DC

## 2017-01-01 NOTE — Progress Notes (Signed)
Cardiology Office Note   Date:  01/01/2017   ID:  Domenico, Achord January 02, 1946, MRN [id number]  PCP:  Dione Housekeeper, MD  Cardiologist:   Skeet Latch, MD   No chief complaint on file.     History of Present Illness: LEIGHTON BRICKLEY is a 71 y.o. male medically managed CAD, hypertension, hyperlipidemia, diabetes, COPD, LBBB, and retinal artery occlusion who presents for follow-up.  He was initially seen for pre-surgical risk assessment 05/2016 prior to cervical spine surgery.  He was found to be at acceptable risk for surgery.  At that appointment his blood pressure was poorly-controlled.  Metoprolol was switched to carvedilol.  He followed up with our pharmacist and his blood pressure remained poorly-controlled so hydralazine was added to his regimen.  He stopped taking this due to dizziness, though his blood pressure still was not well-controlled.  Losartan was switched to irbesartan.  Carvedilol was switched to nebivolol due to fatigue and terazosin was reduced.  His dizziness did not improve so he was switched back to carvedilol.  He underwent C3 through 6 anterior cervical discectomy/decompression with insertion of bone graft and interbody prostheses on 05/2016.  He developed an anterior cervical wound hematoma and possible CSF leak.  He underwent reexploration on 5/30 and was discharged home 6/1.    In 2011 Mr. 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  He had an echo and exercise Cardiolite that were unremarkable.  He also wore a 21 day event monitor for three weeks and no atrial fibrillation was noted.  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  In retrospect he thinks that this is what was wrong with his right eye.  Mr. Faulcon was seen for shortness of breath in 2012 and was thought to have a COPD exacerbation.  However he didn't have a history of COPD.  He was referred for cardiac  catheterization 05/2010, at which time he was noted to have normal right and left heart filling pressures.  He had 50-60 mid RCA stenosis.  Echo revealed LVEF 55% and was otherwise unremarkable.   At his last appointment Mr. Printy' was seen for labile hypertension.  He had been seen in the emergency department for very elevated blood pressures but left before being seen.  At that appointment nebivolol was switched back to carvedilol and he was given hydralazine as needed.  His blood pressure at that appointment was well-controlled.  He brings a log of his pressure today showing that it has ranged from 242-353 systolic.  Most pressures are in the 130s-140s.  He has a couple in the low 100s and 110s.  He feels lightheaded and dizzy when this happens.  He has not been getting much formal exercise but does physical therapy for his arm twice per week.  He also chases after his twin 11-month-old grandchildren who are learning to walk.  He has no exertional symptoms.  He also denies lower extremity edema, orthopnea, or PND.  His only complaint today is neck pain.  His neck feels stiff similar to before he had surgery on it.   Past Medical History:  Diagnosis Date  . Arrhythmia   . Arthritis   . Chest pain, unspecified   . Chronic airway obstruction, not elsewhere classified    PT. DENIES HE HAS COPD  . CKD (chronic kidney disease)   . Colon polyps  Adenomatous Polyps 2007  . Depression   . Diabetes mellitus   . Diverticulosis   . Gout   . Heart murmur   . Hyperlipemia   . Hypertension   . Nephrolithiasis   . Obstructive sleep apnea (adult) (pediatric)   . Other and unspecified hyperlipidemia   . Other left bundle branch block   . Retinal vascular occlusion, unspecified    right  . Shortness of breath    on exertion  . Unspecified sleep apnea   . Vision loss of right eye     Past Surgical History:  Procedure Laterality Date  . ANTERIOR CERVICAL DECOMP/DISCECTOMY FUSION N/A 06/26/2016    Procedure: Re-Exploration of Cervical Wound;  Surgeon: Kary Kos, MD;  Location: [facility];  Service: Neurosurgery;  Laterality: N/A;  . ANTERIOR CERVICAL DECOMP/DISCECTOMY FUSION N/A 06/26/2016   Procedure: ANTERIOR CERVICAL DECOMPRESSION/DISCECTOMY FUSION, INTERBODY PROSTESIS, PLATE, CERVICAL THREE CERVICAL FOUR, CERVICAL FOUR CERVICAL FIVE CERVICAL SIX;  Surgeon: Newman Pies, MD;  Location[facility];  Service: Neurosurgery;  Laterality: N/A;  . CATARACT EXTRACTION Bilateral   . KIDNEY STONE SURGERY       Current Outpatient Medications  Medication Sig Dispense Refill  . acetaminophen (TYLENOL) 500 MG tablet Take 1,000 mg by mouth every 6 (six) hours as needed (for pain.).    Marland Kitchen acetaminophen-codeine (TYLENOL #3) 300-30 MG tablet Take 1 tablet daily by mouth.  0  . amoxicillin-clavulanate (AUGMENTIN) 875-125 MG tablet Take 1 tablet 2 (two) times daily by mouth.    Marland Kitchen atorvastatin (LIPITOR) 40 MG tablet Take 20 mg by mouth at bedtime.     . carvedilol (COREG) 25 MG tablet Take 1 tablet (25 mg total) 2 (two) times daily by mouth. 60 tablet 5  . glipiZIDE (GLUCOTROL XL) 10 MG 24 hr tablet Take 10 mg by mouth 2 (two) times daily.    . hydrALAZINE (APRESOLINE) 25 MG tablet 1 TABLET EVERY 6 HOURS AS NEEDED FOR SYSTOLIC BLOOD PRESSURE 128 OR GREATER 60 tablet 5  . irbesartan-hydrochlorothiazide (AVALIDE) 300-12.5 MG tablet Take 1 tablet by mouth daily. Take place of losartan 30 tablet 5  . Ketotifen Fumarate (ITCHY EYE DROPS OP) Place 1 drop into both eyes daily as needed (itching/allergies).     . metFORMIN (GLUCOPHAGE) 500 MG tablet Take 1,000 mg by mouth 2 (two) times daily with a meal.     . pantoprazole (PROTONIX) 40 MG tablet Take 40 mg by mouth daily.     . sertraline (ZOLOFT) 100 MG tablet Take 100 mg daily by mouth.     . sitaGLIPtan (JANUVIA) 100 MG tablet Take 100 mg by mouth daily.      Marland Kitchen terazosin (HYTRIN) 2 MG capsule Take 1 capsule (2 mg total) by mouth at bedtime. 30 capsule 4   No  current facility-administered medications for this visit.     Allergies:   Penicillins    Social History:  The patient  reports that he quit smoking about 30 years ago. His smoking use included cigarettes. He has a 37.50 pack-year smoking history. he has never used smokeless tobacco. He reports that he does not drink alcohol or use drugs.   Family History:  The patient's family history includes Arthritis/Rheumatoid in his cousin; Atrial fibrillation in his brother; Diabetes in his other; Heart attack (age of onset: 52) in his brother; Heart disease in his father; Parkinson's disease in his brother; Stroke in his father; Uterine cancer in his mother.    ROS:  Please see the history  of present illness.   Otherwise, review of systems are positive for none.   All other systems are reviewed and negative.    PHYSICAL EXAM: VS:  BP (!) 146/68   Pulse 64   Ht 5\' 10"  (1.778 m)   Wt 204 lb 6.4 oz (92.7 kg)   BMI 29.33 kg/m  , BMI Body mass index is 29.33 kg/m. GENERAL:  Well appearing.  No acute distress HEENT: Pupils equal round and reactive, fundi not visualized, oral mucosa unremarkable NECK:  No jugular venous distention, waveform within normal limits, carotid upstroke brisk and symmetric, no bruits, no thyromegaly LUNGS:  Clear to auscultation bilaterally HEART:  RRR.  PMI not displaced or sustained,S1 and S2 within normal limits, no S3, no S4, no clicks, no rubs, no murmurs ABD:  Flat, positive bowel sounds normal in frequency in pitch, no bruits, no rebound, no guarding, no midline pulsatile mass, no hepatomegaly, no splenomegaly EXT:  2 plus pulses throughout, no edema, no cyanosis no clubbing SKIN:  No rashes no nodules NEURO:  Cranial nerves II through XII grossly intact, motor grossly intact throughout PSYCH:  Cognitively intact, oriented to person place and time   EKG:  EKG is ordered today. The ekg ordered 06/19/16 demonstrates sinus bradycardia.  LBBB.  LAD.   01/01/17: Sinus  rhythm.  Rate 64 bpm.  Left bundle branch block.  Left axis deviation.   Recent Labs: 06/19/2016: Hemoglobin 12.9; Platelets 161 10/30/2016: BUN 23; Creatinine, Ser 1.45; Potassium 3.9; Sodium 141    Lipid Panel    Component Value Date/Time   CHOL 127 06/08/2010 1110   TRIG 122.0 06/08/2010 1110   HDL 26.80 (L) 06/08/2010 1110   CHOLHDL 5 06/08/2010 1110   VLDL 24.4 06/08/2010 1110   LDLCALC 76 06/08/2010 1110   03/26/16: Total cholesterol 129, triglycerides 140, HDL 33, LDL 68   Wt Readings from Last 3 Encounters:  01/01/17 204 lb 6.4 oz (92.7 kg)  12/13/16 204 lb (92.5 kg)  12/04/16 202 lb 3.2 oz (91.7 kg)      ASSESSMENT AND PLAN:  # CAD: Mr. Creason has non-obstructive CAD that is medically managed.  Continue aspirin, carvedilol, and atorvastatin.  He is on aspirin 325mg  per neurology for prior stroke.    # Hypertension: Blood pressure is better but still labile.  For the last two days it has been 425-956 systolic.  Continue carvedilol, irbesartan and hydrochlorothiazide.  Increase terazosin to 3 mg daily.  Continue prn hydralazine.  # Hyperlipidemia: Continue atorvastatin.  LDL was 68 02/2016.   Current medicines are reviewed at length with the patient today.  The patient does not have concerns regarding medicines.  The following changes have been made:  Stop nebivolol and start carvedilol 25mg    Labs/ tests ordered today include:  No orders of the defined types were placed in this encounter.    Disposition:   FU with Kieana Livesay C. Oval Linsey, MD, [facility] in 3 months.     Signed, Aleecia Tapia C. Oval Linsey, MD, [facility]  01/01/2017 9:40 AM    [place]

## 2017-01-01 NOTE — Patient Instructions (Addendum)
Medication Instructions:  INCREASE YOUR TERAZOSIN TO 3 MG TOTAL (2 MG AND 1 MG DAILY)  Labwork: NONE  Testing/Procedures: NONE  Follow-Up: Your physician recommends that you schedule a follow-up appointment in: 3 MONTH OV  Any Other Special Instructions Will Be Listed Below (If Applicable). MONITOR YOUR BLOOD PRESSURE AT HOME AND BRING YOUR READINGS TO YOUR FOLLOW UP   If you need a refill on your cardiac medications before your next appointment, please call your pharmacy.

## 2017-01-02 ENCOUNTER — Ambulatory Visit: Payer: Medicare Other | Admitting: Physical Therapy

## 2017-01-02 ENCOUNTER — Encounter: Payer: Self-pay | Admitting: Physical Therapy

## 2017-01-02 DIAGNOSIS — M62838 Other muscle spasm: Secondary | ICD-10-CM

## 2017-01-02 DIAGNOSIS — M6281 Muscle weakness (generalized): Secondary | ICD-10-CM

## 2017-01-02 DIAGNOSIS — M542 Cervicalgia: Secondary | ICD-10-CM

## 2017-01-03 NOTE — Therapy (Signed)
[facility] [address] [place], [address] Phone: [phone]   Fax:  [phone]  Physical Therapy Treatment  Patient Details  Name: Jeffery Fernandez MRN: [id number] Date of Birth: 06-Aug-1945 Referring Provider: Ophelia Charter MD   Encounter Date: 01/02/2017  PT End of Session - 01/02/17 1523    Visit Number  6    Number of Visits  12    Date for PT Re-Evaluation  01/08/17    Authorization Type  6    Authorization Time Period  10 G codes    PT Start Time  6063    PT Stop Time  1516    PT Time Calculation (min)  43 min    Activity Tolerance  Patient tolerated treatment well    Behavior During Therapy  [facility] for tasks assessed/performed       Past Medical History:  Diagnosis Date  . Arrhythmia   . Arthritis   . Chest pain, unspecified   . Chronic airway obstruction, not elsewhere classified    PT. DENIES HE HAS COPD  . CKD (chronic kidney disease)   . Colon polyps    Adenomatous Polyps 2007  . Depression   . Diabetes mellitus   . Diverticulosis   . Gout   . Heart murmur   . Hyperlipemia   . Hypertension   . Nephrolithiasis   . Obstructive sleep apnea (adult) (pediatric)   . Other and unspecified hyperlipidemia   . Other left bundle branch block   . Retinal vascular occlusion, unspecified    right  . Shortness of breath    on exertion  . Unspecified sleep apnea   . Vision loss of right eye     Past Surgical History:  Procedure Laterality Date  . ANTERIOR CERVICAL DECOMP/DISCECTOMY FUSION N/A 06/26/2016   Procedure: Re-Exploration of Cervical Wound;  Surgeon: Kary Kos, MD;  Location: [facility];  Service: Neurosurgery;  Laterality: N/A;  . ANTERIOR CERVICAL DECOMP/DISCECTOMY FUSION N/A 06/26/2016   Procedure: ANTERIOR CERVICAL DECOMPRESSION/DISCECTOMY FUSION, INTERBODY PROSTESIS, PLATE, CERVICAL THREE CERVICAL FOUR, CERVICAL FOUR CERVICAL FIVE CERVICAL SIX;  Surgeon: Newman Pies, MD;   Location: [facility];  Service: Neurosurgery;  Laterality: N/A;  . CATARACT EXTRACTION Bilateral   . KIDNEY STONE SURGERY      There were no vitals filed for this visit.  Subjective Assessment - 01/02/17 1439    Subjective  Patient reports he is having soreness in neck muscles today and feels this is related to scapular retraction exercises.    Pertinent History  history of neck pain with progressive weakness in right UE that began around 09/2015. He had PT prior to surgery with mixed results and underwent surgery for cervical spine decomprression with fusion 06/26/2016. Since surgery he has been self managing healing at home. Overall he has noticed less pain in neck and head and improved strength in right LE. He continues with weakness in both UE's and back.    Limitations  Lifting;House hold activities;Other (comment) holding grandchildren    Patient Stated Goals  improve upper body strength to be able to play with grandchildren     Currently in Pain?  Other (Comment) soreness in cervical spine, biceps right side       Objective:  Palpation: spasms  bilateral cervical spine, upper trapezius muscles  Treatment:  Manual therapy: 15 min. : goal: spasm, improved soft tissue elasticity STM performed with superficial techniques to cervical spine and bilateral upper trapezius  and mid thoracic spine musculature with patient seated in chair, UE's supported  Therapeutic exercise:patient performed with instruction, verbal cues, tactile cuesand demonstration of therapist: goal: improve strength, quick Dash, independent with home exercises  Side lying left; performed with RIGHT shoulder  Tactile and verbal cuing required: shoulder elevation and depression x 10 and scapular protraction/retraction x 10 Shoulder ER x 15 with controlled motion and scapular control  Modalities: Electrical stimulation:20 min.Russian stim. 10/10 cycle, intensity to contraction,applied (2) electrodes toright biceps  (30ma)and periscapular muscles rhomboids and lower trapezius muscles (24ma)with patientsittingwithright UE supported on pillow; pt. goal muscle re education  Patient response to treatment: improved technique with exercises with tactile and VC with repetition. Improved soft tissue elasticity with decreased spasm by >50% following STM. Improved muscle activation with estim.    PT Education - 01/02/17 1442    Education provided  Yes    Education Details  exercise instruction    Person(s) Educated  Patient    Methods  Explanation;Demonstration;Verbal cues    Comprehension  Verbalized understanding;Returned demonstration;Verbal cues required          PT Long Term Goals - 11/27/16 1952      PT LONG TERM GOAL #1   Title  Patient will be independent in home exercise program by discharge to improve strength/mobility for improved right UE use for yard work and playing with grandchildren     Baseline  limited knowledge of appropriate exercises and progression without guidance and moderate cuing/demonstration    Status  New    Target Date  01/08/17      PT LONG TERM GOAL #2   Title  improve NDI to <15% demonstrating improved function with ADL's with less difficulty     Baseline  NDI 26%    Status  New    Target Date  12/25/16      PT LONG TERM GOAL #3   Title  Patient will demonstrate improved strength right UE elbow flexion to at least 3+/5 by discharge to allow continued exercise and be able to perform light activties with minimal to no difficulty     Baseline  right elbow flexoin 3-/5    Status  New    Target Date  01/08/17            Plan - 01/02/17 1534    Clinical Impression Statement  Patient is progressing steadily towards all goals with improvement noted in strength and motor control right UE and periscapular muscles. improving functional use right UE with daily tasks. He continues with weakness and will benefit from continued physical therapy intervention.      Rehab Potential  Fair    PT Frequency  2x / week    PT Duration  6 weeks    PT Treatment/Interventions  Electrical Stimulation;Patient/family education;Neuromuscular re-education;Therapeutic exercise;Manual techniques    PT Next Visit Plan  modalities for muscle re education right UE biceps, manual therapy for spasm in upper traps/cervical spine; therapeutic exercises for strength right UE, upper back    PT Home Exercise Plan  posture awareness, scapular retraction, active right elbow flexion daily and light weights 1-3# 3x/week       Patient will benefit from skilled therapeutic intervention in order to improve the following deficits and impairments:  Decreased strength, Decreased activity tolerance, Impaired perceived functional ability, Impaired UE functional use, Increased muscle spasms, Decreased endurance  Visit Diagnosis: Muscle weakness (generalized)  Other muscle spasm  Cervicalgia     Problem List Patient Active Problem List  Diagnosis Date Noted  . Cervical spondylosis with myelopathy and radiculopathy 06/26/2016  . Spinal stenosis of cervical region 06/26/2016  . Personal history of colonic polyps 06/14/2011  . CAD (coronary artery disease) 07/10/2010  . OSA (obstructive sleep apnea) 06/10/2010  . Hypertension 06/10/2010  . COPD (chronic obstructive pulmonary disease) ([place]) 06/10/2010  . Hyperlipidemia 06/10/2010  . Retinal artery occlusion 06/10/2010    Jomarie Longs PT 01/03/2017, 1:55 PM  [facility] [address] [place], [address] Phone: [phone]   Fax:  [phone]  Name: HANNAH CRILL MRN: [id number] Date of Birth: 1945-09-18

## 2017-01-06 ENCOUNTER — Ambulatory Visit: Payer: Medicare Other | Admitting: Physical Therapy

## 2017-01-08 ENCOUNTER — Ambulatory Visit: Payer: Medicare Other | Admitting: Physical Therapy

## 2017-01-13 ENCOUNTER — Ambulatory Visit: Payer: Medicare Other | Admitting: Physical Therapy

## 2017-01-13 ENCOUNTER — Encounter: Payer: Self-pay | Admitting: Physical Therapy

## 2017-01-13 DIAGNOSIS — M6281 Muscle weakness (generalized): Secondary | ICD-10-CM | POA: Diagnosis not present

## 2017-01-13 DIAGNOSIS — M542 Cervicalgia: Secondary | ICD-10-CM

## 2017-01-13 DIAGNOSIS — M62838 Other muscle spasm: Secondary | ICD-10-CM

## 2017-01-13 NOTE — Therapy (Signed)
[facility] [address] [place], [address] Phone: [phone]   Fax:  [phone]  Physical Therapy Treatment  Patient Details  Name: Jeffery Fernandez MRN: [id number] Date of Birth: 1945/03/08 Referring Provider: Ophelia Charter MD   Encounter Date: 01/13/2017  PT End of Session - 01/13/17 1109    Visit Number  7    Number of Visits  12    Date for PT Re-Evaluation  02/10/17    Authorization Type  7    Authorization Time Period  10 G codes    PT Start Time  1101    PT Stop Time  1148    PT Time Calculation (min)  47 min    Activity Tolerance  Patient tolerated treatment well    Behavior During Therapy  [place] for tasks assessed/performed       Past Medical History:  Diagnosis Date  . Arrhythmia   . Arthritis   . Chest pain, unspecified   . Chronic airway obstruction, not elsewhere classified    PT. DENIES HE HAS COPD  . CKD (chronic kidney disease)   . Colon polyps    Adenomatous Polyps 2007  . Depression   . Diabetes mellitus   . Diverticulosis   . Gout   . Heart murmur   . Hyperlipemia   . Hypertension   . Nephrolithiasis   . Obstructive sleep apnea (adult) (pediatric)   . Other and unspecified hyperlipidemia   . Other left bundle branch block   . Retinal vascular occlusion, unspecified    right  . Shortness of breath    on exertion  . Unspecified sleep apnea   . Vision loss of right eye     Past Surgical History:  Procedure Laterality Date  . ANTERIOR CERVICAL DECOMP/DISCECTOMY FUSION N/A 06/26/2016   Procedure: Re-Exploration of Cervical Wound;  Surgeon: Kary Kos, MD;  Location: [facility];  Service: Neurosurgery;  Laterality: N/A;  . ANTERIOR CERVICAL DECOMP/DISCECTOMY FUSION N/A 06/26/2016   Procedure: ANTERIOR CERVICAL DECOMPRESSION/DISCECTOMY FUSION, INTERBODY PROSTESIS, PLATE, CERVICAL THREE CERVICAL FOUR, CERVICAL FOUR CERVICAL FIVE CERVICAL SIX;  Surgeon: Newman Pies, MD;   Location: [facility];  Service: Neurosurgery;  Laterality: N/A;  . CATARACT EXTRACTION Bilateral   . KIDNEY STONE SURGERY      There were no vitals filed for this visit.  Subjective Assessment - 01/13/17 1106    Subjective  Patient reports he improving with less neck pain and able to pick up grandchildren with less difficulty using right UE.     Pertinent History  history of neck pain with progressive weakness in right UE that began around 09/2015. He had PT prior to surgery with mixed results and underwent surgery for cervical spine decomprression with fusion 06/26/2016. Since surgery he has been self managing healing at home. Overall he has noticed less pain in neck and head and improved strength in right LE. He continues with weakness in both UE's and back.    Limitations  Lifting;House hold activities;Other (comment) holding grandchildren    Patient Stated Goals  improve upper body strength to be able to play with grandchildren     Currently in Pain?  No/denies         [facility] PT Assessment - 01/13/17 1159      Assessment   Medical Diagnosis  spondylosis cervical spine with myelopathy     Referring Provider  Newman Pies D MD    Onset Date/Surgical Date  06/26/16  Hand Dominance  Right      Observation/Other Assessments   Quick DASH   30%      Strength   Overall Strength Comments  right biceps 3+/5 with supination; improved right elbow flexion with shoulder at 90 degrees elevation with decreased control noted      Palpation   Palpation comment  cervical spine paraspinal muscles and right upper trapezius with mild spasms palapable        Objective:  Palpation: spasms  bilateral cervical spine left>right, right upper trapezius muscle Outcome measure: Quick Dash 30%; NDI 10% (was 26%) Strength: right UE shoulder forward elevation 4/5, abduction 4-/5; elbow flexion with supination 3+5 (was 3-/5)   Treatment:  Manual therapy: 10 min. : goal: spasm, improved soft tissue  elasticity STM performed with superficial techniques to cervical spine and bilateral upper trapezius and mid thoracic spine musculature with patient seated in chair, UE's supported  Therapeutic exercise:patient performed with instruction, verbal cues, tactile cuesand demonstration of therapist: goal: improve strength, quick Dash, independent with home exercises  Side lying left; performed with RIGHT shoulder  Tactile and verbal cuing required: manual resisted with moderate resistance given for shoulder elevation and depression 2 x 10 and scapular protraction/retraction 2 x 10 Supine Lying:  Manual resistive right elbow flexion/extension x 10 with moderate resistance and guided motion to improve supination  Modalities: Electrical stimulation:20 min.Russian stim. 10/10 cycle, intensity to contraction,applied (2) electrodes toright biceps (52ma)and periscapular muscles rhomboids and lower trapezius muscles (45 ma)with patientsittingwithright UE supported on pillow; pt. goal muscle re education  Patient response to treatment: improved strength noted with resistive exercises with moderate intensity. Improved motor control with repetition. Decreased spasms with STM by >50% following treatment. Improved muscle contraction with estim. With full contraction achieved to biceps and periscapular muscles.    PT Education - 01/13/17 1108    Education provided  Yes    Education Details  exercise insruction     Person(s) Educated  Patient    Methods  Explanation;Demonstration;Verbal cues    Comprehension  Verbalized understanding;Returned demonstration;Verbal cues required          PT Long Term Goals - 01/13/17 1145      PT LONG TERM GOAL #1   Title  Patient will be independent in home exercise program by discharge to improve strength/mobility for improved right UE use for yard work and playing with grandchildren     Baseline  limited knowledge of appropriate exercises and  progression without guidance and moderate cuing/demonstration    Status  On-going    Target Date  02/10/17      PT LONG TERM GOAL #2   Title  improve NDI to <15% demonstrating improved function with ADL's with less difficulty     Baseline  NDI 26%; NDI 10%    Status  Achieved      PT LONG TERM GOAL #3   Title  Patient will demonstrate improved strength right UE elbow flexion to at least 3+/5 by discharge to allow continued exercise and be able to perform light activties with minimal to no difficulty     Baseline  right elbow flexoin 3-/5; 01/13/18 3+/5    Status  Achieved      PT LONG TERM GOAL #4   Title  Patient will improve quick dash score to <20% impairment  to demonstrate improved functional mobility with ADLs    Baseline  30%    Status  New    Target Date  02/10/17  PT LONG TERM GOAL #5   Title  Patient will demonstrate improved strength right UE elbow flexion to at least 4-/5 by discharge to allow continued exercise and be able to perform light activties with minimal to no difficulty     Baseline  3+/5 with moderate difficulty performing lifting, picking up grandchildren     Status  New            Plan - 01/13/17 1157    Clinical Impression Statement  Patient is progressing steadily with improvement noted in strength and functional use right UE with less difficulty than when he began physical therapy. He has 30% impairment based on quickDash scores and strength deficits and will therefore benefit from additional physical therapy intervention to achieve maximal functional use with mild to no difficulty right UE.     Rehab Potential  Fair    PT Frequency  2x / week    PT Duration  4 weeks    PT Treatment/Interventions  Electrical Stimulation;Patient/family education;Neuromuscular re-education;Therapeutic exercise;Manual techniques    PT Next Visit Plan  modalities for muscle re education right UE biceps, manual therapy for spasm in upper traps/cervical spine;  therapeutic exercises for strength right UE, upper back    PT Home Exercise Plan  posture awareness, scapular retraction, active right elbow flexion daily and light weights 1-3# 3x/week    Consulted and Agree with Plan of Care  Patient       Patient will benefit from skilled therapeutic intervention in order to improve the following deficits and impairments:  Decreased strength, Decreased activity tolerance, Impaired perceived functional ability, Impaired UE functional use, Increased muscle spasms, Decreased endurance  Visit Diagnosis: Muscle weakness (generalized) - Plan: PT plan of care cert/re-cert  Other muscle spasm - Plan: PT plan of care cert/re-cert  Cervicalgia - Plan: PT plan of care cert/re-cert     Problem List Patient Active Problem List   Diagnosis Date Noted  . Cervical spondylosis with myelopathy and radiculopathy 06/26/2016  . Spinal stenosis of cervical region 06/26/2016  . Personal history of colonic polyps 06/14/2011  . CAD (coronary artery disease) 07/10/2010  . OSA (obstructive sleep apnea) 06/10/2010  . Hypertension 06/10/2010  . COPD (chronic obstructive pulmonary disease) ([place]) 06/10/2010  . Hyperlipidemia 06/10/2010  . Retinal artery occlusion 06/10/2010    Jomarie Longs PT 01/13/2017, 12:10 PM  [facility] [address], [place], [address] Phone: [phone]   Fax:  [phone]  Name: DRAVIN LANCE MRN: [id number] Date of Birth: 04-Jul-1945

## 2017-01-16 ENCOUNTER — Encounter: Payer: Self-pay | Admitting: Physical Therapy

## 2017-01-16 ENCOUNTER — Ambulatory Visit: Payer: Medicare Other | Admitting: Physical Therapy

## 2017-01-16 ENCOUNTER — Other Ambulatory Visit: Payer: Self-pay | Admitting: Cardiovascular Disease

## 2017-01-16 DIAGNOSIS — M62838 Other muscle spasm: Secondary | ICD-10-CM

## 2017-01-16 DIAGNOSIS — M6281 Muscle weakness (generalized): Secondary | ICD-10-CM

## 2017-01-16 NOTE — Therapy (Signed)
[facility] [address] [place], [address] Phone: [phone]   Fax:  [phone]  Physical Therapy Treatment/Discharge Summary  Patient Details  Name: Jeffery Fernandez MRN: [id number] Date of Birth: Jan 08, 1946 Referring Provider: Ophelia Charter MD   Encounter Date: 01/16/2017   Patient began physical therapy on 11/27/2016 and has attended 8 sessions through 01/16/2017. He has achieved/partially met goals and is independent in home program for continued self management of pain/symptoms and exercises as instructed. Plan discharge from physical therapy at this time.    PT End of Session - 01/16/17 1531    Visit Number  8    Number of Visits  12    Date for PT Re-Evaluation  02/10/17    Authorization Type  8    Authorization Time Period  10 G codes    PT Start Time  04-11-1520    PT Stop Time  1615    PT Time Calculation (min)  53 min    Activity Tolerance  Patient tolerated treatment well    Behavior During Therapy  WFL for tasks assessed/performed       Past Medical History:  Diagnosis Date  . Arrhythmia   . Arthritis   . Chest pain, unspecified   . Chronic airway obstruction, not elsewhere classified    PT. DENIES HE HAS COPD  . CKD (chronic kidney disease)   . Colon polyps    Adenomatous Polyps 2005-04-11  . Depression   . Diabetes mellitus   . Diverticulosis   . Gout   . Heart murmur   . Hyperlipemia   . Hypertension   . Nephrolithiasis   . Obstructive sleep apnea (adult) (pediatric)   . Other and unspecified hyperlipidemia   . Other left bundle branch block   . Retinal vascular occlusion, unspecified    right  . Shortness of breath    on exertion  . Unspecified sleep apnea   . Vision loss of right eye     Past Surgical History:  Procedure Laterality Date  . ANTERIOR CERVICAL DECOMP/DISCECTOMY FUSION N/A 06/26/2016   Procedure: Re-Exploration of Cervical Wound;  Surgeon: Kary Kos, MD;   Location: [facility];  Service: Neurosurgery;  Laterality: N/A;  . ANTERIOR CERVICAL DECOMP/DISCECTOMY FUSION N/A 06/26/2016   Procedure: ANTERIOR CERVICAL DECOMPRESSION/DISCECTOMY FUSION, INTERBODY PROSTESIS, PLATE, CERVICAL THREE CERVICAL FOUR, CERVICAL FOUR CERVICAL FIVE CERVICAL SIX;  Surgeon: Newman Pies, MD;  Location: [facility];  Service: Neurosurgery;  Laterality: N/A;  . CATARACT EXTRACTION Bilateral   . KIDNEY STONE SURGERY      There were no vitals filed for this visit.  Subjective Assessment - 01/16/17 1527    Subjective   Patient reports he is doing well and does not have any arm or neck pain and feels ready to continue on own with self management and home program    Pertinent History  history of neck pain with progressive weakness in right UE that began around 09/2015. He had PT prior to surgery with mixed results and underwent surgery for cervical spine decomprression with fusion 06/26/2016. Since surgery he has been self managing healing at home. Overall he has noticed less pain in neck and head and improved strength in right LE. He continues with weakness in both UE's and back.    Limitations  Lifting;House hold activities;Other (comment) holding grandchildren    Patient Stated Goals  improve upper body strength to be able to play with grandchildren  Currently in Pain?  No/denies         Objective: Palpation: mild spasms palpable along right upper trapezius and mid thoracic spine Outcome measure: Quick Dash 30%; NDI 10% (was 26%) (re assessed 01/13/17) Strength: right UE shoulder forward elevation 4/5, abduction 4/5; elbow flexion with supination 3+5 (was 3-/5)   Treatment: Manual therapy: 8 min. : goal: spasm, improved soft tissue elasticity STM performed with superficial techniques to cervical spine and bilateral upper trapezius and mid thoracic spine musculature with patient seated in chair, UE's supported  Therapeutic exercise:patient performed with instruction,  verbal cues, tactile cuesand demonstration of therapist: goal: improve strength, quick Dash, independent with home exercises Sitting:  Right elbow flexion with supination, forearm supination each 10 reps Standing at wall: Pizza carry for scapular control, forearm supination with towel between right elbow and trunk 10 reps scaption at wall with 2# dumbbells x 10 reps    Modalities: Electrical stimulation:14mn.Russian stim. 10/10 cycle, intensity to contraction,applied (2) electrodes toright biceps (335mand periscapular muscles rhomboids and lower trapezius muscles (45 ma)with patientsittingwithright UE supported on pillow; pt. goal muscle re education  Patient response to treatment:improved strength noted with resistive exercises with moderate intensity. Improved motor control with repetition. Decreased spasms with STM by >50% following treatment. Improved muscle contraction with estim. With full contraction achieved to biceps and periscapular muscles. Patient verbalized good understanding of home program.      PT Education - 01/16/17 1531    Education provided  Yes    Education Details  exercises re assessed for home program: added standing scaption and scapular control with ER at wall, towel under right arm   Person(s) Educated  Patient    Methods  Explanation, demonstration, verbal cues, handout   Comprehension  Verbalized understanding, returned demonstration with verbal cues         PT Long Term Goals - 01/16/17 1641      PT LONG TERM GOAL #1   Title  Patient will be independent in home exercise program by discharge to improve strength/mobility for improved right UE use for yard work and playing with grandchildren     Baseline  limited knowledge of appropriate exercises and progression without guidance and moderate cuing/demonstration; independent with good understanding and able to perform with proper technique    Status  Achieved      PT LONG TERM GOAL #2    Title  improve NDI to <15% demonstrating improved function with ADL's with less difficulty     Baseline  NDI 26%; NDI 10%    Status  Achieved      PT LONG TERM GOAL #3   Title  Patient will demonstrate improved strength right UE elbow flexion to at least 3+/5 by discharge to allow continued exercise and be able to perform light activties with minimal to no difficulty     Baseline  right elbow flexoin 3-/5; 01/13/18 3+/5    Status  Achieved      PT LONG TERM GOAL #4   Title  Patient will improve quick dash score to <20% impairment  to demonstrate improved functional mobility with ADLs    Baseline  30%    Status  Not Met  Due to just re assessing last session     PT LONG TERM GOAL #5   Title  Patient will demonstrate improved strength right UE elbow flexion to at least 4-/5 by discharge to allow continued exercise and be able to perform light activties with minimal to no difficulty  Baseline  3+/5 with moderate difficulty performing lifting, picking up grandchildren: right UE elbow flexion with supination 4-/5; shoulder flexion, abduction 4/5    Status  Achieved            Plan - 01/18/2017 1532    Clinical Impression Statement Paitent has achieved most goals, is independent with home program for self management and is ready for discharge    Rehab Potential  Fair    PT Frequency  2x / week    PT Duration  4 weeks    PT Treatment/Interventions  Electrical Stimulation;Patient/family education;Neuromuscular re-education;Therapeutic exercise;Manual techniques    PT Next Visit Plan  modalities for muscle re education right UE biceps, manual therapy for spasm in upper traps/cervical spine; therapeutic exercises for strength right UE, upper back    PT Home Exercise Plan  posture awareness, scapular retraction, active right elbow flexion daily and light weights 1-3# 3x/week    Consulted and Agree with Plan of Care  Patient       Patient will benefit from skilled therapeutic  intervention in order to improve the following deficits and impairments:  Decreased strength, Decreased activity tolerance, Impaired perceived functional ability, Impaired UE functional use, Increased muscle spasms, Decreased endurance  Visit Diagnosis: Muscle weakness (generalized)  Other muscle spasm   G-Codes - 01-18-17 1642    Functional Assessment Tool Used (Outpatient Only)  NDI, strength, ROM, clinical judgment     Functional Limitation  Carrying, moving and handling objects    Carrying, Moving and Handling Objects Goal Status ([id number])  At least 1 percent but less than 20 percent impaired, limited or restricted    Carrying, Moving and Handling Objects Discharge Status [phone])  At least 20 percent but less than 40 percent impaired, limited or restricted       Problem List Patient Active Problem List   Diagnosis Date Noted  . Cervical spondylosis with myelopathy and radiculopathy 06/26/2016  . Spinal stenosis of cervical region 06/26/2016  . Personal history of colonic polyps 06/14/2011  . CAD (coronary artery disease) 07/10/2010  . OSA (obstructive sleep apnea) 06/10/2010  . Hypertension 06/10/2010  . COPD (chronic obstructive pulmonary disease) ([place]) 06/10/2010  . Hyperlipidemia 06/10/2010  . Retinal artery occlusion 06/10/2010    Jomarie Longs PT 01/18/17, 4:43 PM  [facility] [address], [place], [address] Phone: [phone]   Fax:  [phone]  Name: Jeffery Fernandez MRN: [id number] Date of Birth: 14-May-1945

## 2017-01-16 NOTE — Telephone Encounter (Signed)
Please review for refill. Thanks!  

## 2017-01-20 ENCOUNTER — Ambulatory Visit: Payer: Medicare Other | Admitting: Physical Therapy

## 2017-01-23 ENCOUNTER — Ambulatory Visit: Payer: Medicare Other | Admitting: Physical Therapy

## 2017-01-27 ENCOUNTER — Encounter: Payer: Medicare Other | Admitting: Physical Therapy

## 2017-01-29 ENCOUNTER — Encounter: Payer: Medicare Other | Admitting: Physical Therapy

## 2017-01-30 ENCOUNTER — Encounter: Payer: Medicare Other | Admitting: Physical Therapy

## 2017-02-03 ENCOUNTER — Ambulatory Visit: Payer: Medicare Other | Admitting: Physical Therapy

## 2017-02-06 ENCOUNTER — Encounter: Payer: Medicare Other | Admitting: Physical Therapy

## 2017-03-05 ENCOUNTER — Ambulatory Visit: Payer: Medicare Other | Admitting: Cardiovascular Disease

## 2017-04-07 ENCOUNTER — Ambulatory Visit (INDEPENDENT_AMBULATORY_CARE_PROVIDER_SITE_OTHER): Payer: Medicare Other | Admitting: Cardiovascular Disease

## 2017-04-07 ENCOUNTER — Encounter: Payer: Self-pay | Admitting: Cardiovascular Disease

## 2017-04-07 VITALS — BP 162/83 | HR 68 | Ht 70.0 in | Wt 204.4 lb

## 2017-04-07 DIAGNOSIS — I2510 Atherosclerotic heart disease of native coronary artery without angina pectoris: Secondary | ICD-10-CM

## 2017-04-07 DIAGNOSIS — E7800 Pure hypercholesterolemia, unspecified: Secondary | ICD-10-CM | POA: Diagnosis not present

## 2017-04-07 DIAGNOSIS — I10 Essential (primary) hypertension: Secondary | ICD-10-CM | POA: Diagnosis not present

## 2017-04-07 MED ORDER — AMLODIPINE BESYLATE 2.5 MG PO TABS: 2.5000 mg | ORAL_TABLET | Freq: Every day | ORAL | 5 refills | Status: DC

## 2017-04-07 NOTE — Progress Notes (Signed)
Cardiology Office Note   Date:  04/07/2017   ID:  Jeffery Fernandez, DOB 1945-09-25, MRN [id number]  PCP:  Dione Housekeeper, MD  Cardiologist:   Skeet Latch, MD   No chief complaint on file.     History of Present Illness: Jeffery Fernandez is a 72 y.o. male medically managed CAD, hypertension, hyperlipidemia, diabetes, COPD, LBBB, and retinal artery occlusion who presents for follow-up.  He was initially seen for pre-surgical risk assessment 05/2016 prior to cervical spine surgery.  He was found to be at acceptable risk for surgery.  At that appointment his blood pressure was poorly-controlled.  Metoprolol was switched to carvedilol.  He followed up with our pharmacist and his blood pressure remained poorly-controlled so hydralazine was added to his regimen.  He stopped taking this due to dizziness, though his blood pressure still was not well-controlled.  Losartan was switched to irbesartan.  Carvedilol was switched to nebivolol due to fatigue and terazosin was reduced.  His dizziness did not improve so he was switched back to carvedilol.  He underwent C3 through 6 anterior cervical discectomy/decompression with insertion of bone graft and interbody prostheses on 05/2016.  He developed an anterior cervical wound hematoma and possible CSF leak.  He underwent reexploration on 5/30 and was discharged home 6/1.    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  He had an echo and exercise Cardiolite that were unremarkable.  He also wore a 21 day event monitor for three weeks and no atrial fibrillation was noted.  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  In retrospect he thinks that this is what was wrong with his right eye.  Jeffery Fernandez was seen for shortness of breath in 2012 and was thought to have a COPD exacerbation.  However he didn't have a history of COPD.  He was referred for cardiac  catheterization 05/2010, at which time he was noted to have normal right and left heart filling pressures.  He had 50-60 mid RCA stenosis.  Echo revealed LVEF 55% and was otherwise unremarkable.   Jeffery Fernandez had been seen in the emergency department 11/2016 for very elevated blood pressures but left before being seen.  At that appointment nebivolol was switched back to carvedilol and he was given hydralazine as needed.  His blood pressure at that appointment was well-controlled.  At his last appointment terazosin was increased due to poorly controlled hypertension.  He brings a log of his pressure showing that it has mostly been in the 140s-160s.  At times it is in the 110-120s.  He has been feeling well and notes occasional dizziness.  He notices it when turning his head to the L.  He denies any recent URIs or syncope.  He mostly exercises by chasing his 52 year old twin grandchildren.  He hasn't experienced any chest pain or shortness of breath.   Past Medical History:  Diagnosis Date  . Arrhythmia   . Arthritis   . Chest pain, unspecified   . Chronic airway obstruction, not elsewhere classified    PT. DENIES HE HAS COPD  . CKD (chronic kidney disease)   . Colon polyps    Adenomatous Polyps 2007  . Depression   . Diabetes mellitus   . Diverticulosis   . Gout   . Heart murmur   . Hyperlipemia   . Hypertension   .  Nephrolithiasis   . Obstructive sleep apnea (adult) (pediatric)   . Other and unspecified hyperlipidemia   . Other left bundle branch block   . Retinal vascular occlusion, unspecified    right  . Shortness of breath    on exertion  . Unspecified sleep apnea   . Vision loss of right eye     Past Surgical History:  Procedure Laterality Date  . ANTERIOR CERVICAL DECOMP/DISCECTOMY FUSION N/A 06/26/2016   Procedure: Re-Exploration of Cervical Wound;  Surgeon: Kary Kos, MD;  Location: [facility];  Service: Neurosurgery;  Laterality: N/A;  . ANTERIOR CERVICAL DECOMP/DISCECTOMY  FUSION N/A 06/26/2016   Procedure: ANTERIOR CERVICAL DECOMPRESSION/DISCECTOMY FUSION, INTERBODY PROSTESIS, PLATE, CERVICAL THREE CERVICAL FOUR, CERVICAL FOUR CERVICAL FIVE CERVICAL SIX;  Surgeon: Newman Pies, MD;  Location: [facility];  Service: Neurosurgery;  Laterality: N/A;  . CATARACT EXTRACTION Bilateral   . KIDNEY STONE SURGERY       Current Outpatient Medications  Medication Sig Dispense Refill  . acetaminophen (TYLENOL) 500 MG tablet Take 1,000 mg by mouth every 6 (six) hours as needed (for pain.).    Marland Kitchen acetaminophen-codeine (TYLENOL #3) 300-30 MG tablet Take 1 tablet daily by mouth.  0  . amoxicillin-clavulanate (AUGMENTIN) 875-125 MG tablet Take 1 tablet 2 (two) times daily by mouth.    Marland Kitchen aspirin 325 MG EC tablet Take 325 mg by mouth daily.    Marland Kitchen atorvastatin (LIPITOR) 40 MG tablet Take 20 mg by mouth at bedtime.     . carvedilol (COREG) 25 MG tablet Take 25 mg by mouth 2 (two) times daily with a meal.    . glipiZIDE (GLUCOTROL XL) 10 MG 24 hr tablet Take 10 mg by mouth 2 (two) times daily.    . hydrALAZINE (APRESOLINE) 25 MG tablet 1 TABLET EVERY 6 HOURS AS NEEDED FOR SYSTOLIC BLOOD PRESSURE 656 OR GREATER 60 tablet 5  . irbesartan-hydrochlorothiazide (AVALIDE) 300-12.5 MG tablet TAKE 1 TABLET BY MOUTH DAILY. TAKE PLACE OF LOSARTAN 30 tablet 2  . Ketotifen Fumarate (ITCHY EYE DROPS OP) Place 1 drop into both eyes daily as needed (itching/allergies).     . metFORMIN (GLUCOPHAGE) 500 MG tablet Take 1,000 mg by mouth 2 (two) times daily with a meal.     . pantoprazole (PROTONIX) 40 MG tablet Take 40 mg by mouth daily.    . sertraline (ZOLOFT) 100 MG tablet Take 100 mg daily by mouth.     . sitaGLIPtan (JANUVIA) 100 MG tablet Take 100 mg by mouth daily.      Marland Kitchen terazosin (HYTRIN) 1 MG capsule Take 1 capsule (1 mg total) by mouth at bedtime. TO BE TAKEN IN ADDITION TO THE 2 MG CAPSULE 90 capsule 3  . terazosin (HYTRIN) 2 MG capsule Take 1 capsule (2 mg total) by mouth at bedtime. 30  capsule 4  . amLODipine (NORVASC) 2.5 MG tablet Take 1 tablet (2.5 mg total) by mouth daily. 30 tablet 5   No current facility-administered medications for this visit.     Allergies:   Penicillins    Social History:  The patient  reports that he quit smoking about 31 years ago. His smoking use included cigarettes. He has a 37.50 pack-year smoking history. he has never used smokeless tobacco. He reports that he does not drink alcohol or use drugs.   Family History:  The patient's family history includes Arthritis/Rheumatoid in his cousin; Atrial fibrillation in his brother; Diabetes in his other; Heart attack (age of onset: 27) in his brother; Heart  disease in his father; Parkinson's disease in his brother; Stroke in his father; Uterine cancer in his mother.    ROS:  Please see the history of present illness.   Otherwise, review of systems are positive for none.   All other systems are reviewed and negative.    PHYSICAL EXAM: VS:  BP (!) 162/83   Pulse 68   Ht 5\' 10"  (1.778 m)   Wt 204 lb 6.4 oz (92.7 kg)   SpO2 94%   BMI 29.33 kg/m  , BMI Body mass index is 29.33 kg/m. GENERAL:  Well appearing HEENT: Pupils equal round and reactive, fundi not visualized, oral mucosa unremarkable NECK:  No jugular venous distention, waveform within normal limits, carotid upstroke brisk and symmetric, no bruits LUNGS:  Clear to auscultation bilaterally HEART:  RRR.  PMI not displaced or sustained,S1 and S2 within normal limits, no S3, no S4, no clicks, no rubs, no murmurs ABD:  Flat, positive bowel sounds normal in frequency in pitch, no bruits, no rebound, no guarding, no midline pulsatile mass, no hepatomegaly, no splenomegaly EXT:  2 plus pulses throughout, no edema, no cyanosis no clubbing SKIN:  No rashes no nodules NEURO:  Cranial nerves II through XII grossly intact, motor grossly intact throughout PSYCH:  Cognitively intact, oriented to person place and time    EKG:  EKG is not ordered  today. The ekg ordered 06/19/16 demonstrates sinus bradycardia.  LBBB.  LAD.   01/01/17: 01/01/17:  Sinus rhythm.  Rate 64 bpm.  Left bundle branch block.  Left axis deviation.   Recent Labs: 06/19/2016: Hemoglobin 12.9; Platelets 161 10/30/2016: BUN 23; Creatinine, Ser 1.45; Potassium 3.9; Sodium 141    Lipid Panel    Component Value Date/Time   CHOL 127 06/08/2010 1110   TRIG 122.0 06/08/2010 1110   HDL 26.80 (L) 06/08/2010 1110   CHOLHDL 5 06/08/2010 1110   VLDL 24.4 06/08/2010 1110   LDLCALC 76 06/08/2010 1110   03/26/16: Total cholesterol 129, triglycerides 140, HDL 33, LDL 68   Wt Readings from Last 3 Encounters:  04/07/17 204 lb 6.4 oz (92.7 kg)  01/01/17 204 lb 6.4 oz (92.7 kg)  12/13/16 204 lb (92.5 kg)      ASSESSMENT AND PLAN:  # CAD: Jeffery Fernandez has non-obstructive CAD that is medically managed.  Continue aspirin, carvedilol, and atorvastatin.  He is on aspirin 325mg  per neurology for prior stroke.    # Hypertension: Blood pressure remains elevated.  Add amlodipine.  Continue irbesartan, HCTZ, carvedilol, and prn hydralazine.  Goal is <130/80.  # Hyperlipidemia: Continue atorvastatin.  LDL was 68 02/2016.   Current medicines are reviewed at length with the patient today.  The patient does not have concerns regarding medicines.  The following changes have been made: Atart amlodipine 2.5mg  daily.  Labs/ tests ordered today include:  No orders of the defined types were placed in this encounter.    Disposition:   FU with Lorian Yaun C. Oval Linsey, MD, [facility] in 4 months.  PharmD in 2 weeks.     Signed, Ikeya Brockel C. Oval Linsey, MD, [facility]  04/07/2017 10:39 AM    [place]

## 2017-04-07 NOTE — Patient Instructions (Signed)
Medication Instructions:  START AMLODIPINE 2.5 MG DAILY   Labwork: NONE  Testing/Procedures: NONE  Follow-Up: Your physician recommends that you schedule a follow-up appointment in: [place] wants you to follow-up in: [place] will receive a reminder letter in the mail two months in advance. If you don't receive a letter, please call our office to schedule the follow-up appointment.  If you need a refill on your cardiac medications before your next appointment, please call your pharmacy.

## 2017-04-21 ENCOUNTER — Other Ambulatory Visit: Payer: Self-pay

## 2017-04-21 MED ORDER — CARVEDILOL 25 MG PO TABS: 25.0000 mg | ORAL_TABLET | Freq: Two times a day (BID) | ORAL | 11 refills | Status: DC

## 2017-04-22 ENCOUNTER — Ambulatory Visit: Payer: Medicare Other

## 2017-04-23 ENCOUNTER — Encounter: Payer: Self-pay | Admitting: Gastroenterology

## 2017-04-23 ENCOUNTER — Ambulatory Visit (INDEPENDENT_AMBULATORY_CARE_PROVIDER_SITE_OTHER): Payer: Medicare Other | Admitting: Gastroenterology

## 2017-04-23 VITALS — BP 144/62 | HR 63 | Ht 69.0 in | Wt 201.0 lb

## 2017-04-23 DIAGNOSIS — R195 Other fecal abnormalities: Secondary | ICD-10-CM | POA: Insufficient documentation

## 2017-04-23 DIAGNOSIS — D649 Anemia, unspecified: Secondary | ICD-10-CM

## 2017-04-23 DIAGNOSIS — I2510 Atherosclerotic heart disease of native coronary artery without angina pectoris: Secondary | ICD-10-CM | POA: Diagnosis not present

## 2017-04-23 HISTORY — DX: Anemia, unspecified: D64.9

## 2017-04-23 MED ORDER — NA SULFATE-K SULFATE-MG SULF 17.5-3.13-1.6 GM/177ML PO SOLN: 1.0000 | Freq: Once | ORAL | 0 refills | Status: AC

## 2017-04-23 NOTE — Progress Notes (Signed)
04/23/2017 Jeffery Fernandez [id number] 02/26/1945   HISTORY OF PRESENT ILLNESS: This is a pleasant 72 year old male who was seen by Dr. Loletha Carrow close to 1 year ago.  He was going to undergo EGD and colonoscopy for evaluation of a mild anemia, but also had a positive Ifob.  He was going to be having neck surgery, therefore the procedures were going to be postponed until after his recovery.  They are returning now in order to schedule his procedures.  He continues to have a mild, normocytic anemia with a hemoglobin in the 12 g range.  He once again had another positive IFOB stool study.  There are no signs of overt GI bleeding.  Iron studies are normal, but we do not have a ferritin level.  He had previously been complaining of some dyspepsia last year, but those symptoms resolved on PPI therapy.  He has continued his PPI and continues without symptoms.   Past Medical History:  Diagnosis Date  . Arrhythmia   . Arthritis   . Chest pain, unspecified   . Chronic airway obstruction, not elsewhere classified    PT. DENIES HE HAS COPD  . CKD (chronic kidney disease)   . Colon polyps    Adenomatous Polyps 2007  . Depression   . Diabetes mellitus   . Diverticulosis   . Gout   . Heart murmur   . Hyperlipemia   . Hypertension   . Nephrolithiasis   . Obstructive sleep apnea (adult) (pediatric)   . Other and unspecified hyperlipidemia   . Other left bundle branch block   . Retinal vascular occlusion, unspecified    right  . Shortness of breath    on exertion  . Unspecified sleep apnea   . Vision loss of right eye    Past Surgical History:  Procedure Laterality Date  . ANTERIOR CERVICAL DECOMP/DISCECTOMY FUSION N/A 06/26/2016   Procedure: Re-Exploration of Cervical Wound;  Surgeon: Kary Kos, MD;  Location: [facility];  Service: Neurosurgery;  Laterality: N/A;  . ANTERIOR CERVICAL DECOMP/DISCECTOMY FUSION N/A 06/26/2016   Procedure: ANTERIOR CERVICAL DECOMPRESSION/DISCECTOMY FUSION, INTERBODY  PROSTESIS, PLATE, CERVICAL THREE CERVICAL FOUR, CERVICAL FOUR CERVICAL FIVE CERVICAL SIX;  Surgeon: Newman Pies, MD;  Location: [facility];  Service: Neurosurgery;  Laterality: N/A;  . CATARACT EXTRACTION Bilateral   . KIDNEY STONE SURGERY      reports that he quit smoking about 31 years ago. His smoking use included cigarettes. He has a 37.50 pack-year smoking history. He has never used smokeless tobacco. He reports that he does not drink alcohol or use drugs. family history includes Arthritis/Rheumatoid in his cousin; Atrial fibrillation in his brother; Diabetes in his other; Heart attack (age of onset: 72) in his brother; Heart disease in his father; Parkinson's disease in his brother; Stroke in his father; Uterine cancer in his mother. Allergies  Allergen Reactions  . Penicillins Other (See Comments)    Possibly rash - not sure Has patient had a PCN reaction causing immediate rash, facial/tongue/throat swelling, SOB or lightheadedness with hypotension:unknown Has patient had a PCN reaction causing severe rash involving mucus membranes or skin necrosis: Unknown Has patient had a PCN reaction that required hospitalization: Unknown Has patient had a PCN reaction occurring within the last 10 years: No Childhood reaction If all of the above answers are "NO", then may proceed with Cephalosporin use.       Outpatient Encounter Medications as of 04/23/2017  Medication Sig  . acetaminophen (TYLENOL) 500 MG tablet  Take 1,000 mg by mouth every 6 (six) hours as needed (for pain.).  Marland Kitchen amLODipine (NORVASC) 2.5 MG tablet Take 1 tablet (2.5 mg total) by mouth daily.  Marland Kitchen aspirin 325 MG EC tablet Take 325 mg by mouth daily.  Marland Kitchen atorvastatin (LIPITOR) 40 MG tablet Take 20 mg by mouth at bedtime.   . carvedilol (COREG) 25 MG tablet Take 1 tablet (25 mg total) by mouth 2 (two) times daily with a meal.  . glipiZIDE (GLUCOTROL XL) 10 MG 24 hr tablet Take 10 mg by mouth 2 (two) times daily.  . hydrALAZINE  (APRESOLINE) 25 MG tablet 1 TABLET EVERY 6 HOURS AS NEEDED FOR SYSTOLIC BLOOD PRESSURE 254 OR GREATER  . irbesartan-hydrochlorothiazide (AVALIDE) 300-12.5 MG tablet TAKE 1 TABLET BY MOUTH DAILY. TAKE PLACE OF LOSARTAN  . Ketotifen Fumarate (ITCHY EYE DROPS OP) Place 1 drop into both eyes daily as needed (itching/allergies).   . metFORMIN (GLUCOPHAGE) 500 MG tablet Take 1,000 mg by mouth 2 (two) times daily with a meal.   . pantoprazole (PROTONIX) 40 MG tablet Take 40 mg by mouth daily.  . sertraline (ZOLOFT) 100 MG tablet Take 100 mg daily by mouth.   . sitaGLIPtan (JANUVIA) 100 MG tablet Take 100 mg by mouth daily.    Marland Kitchen terazosin (HYTRIN) 1 MG capsule Take 1 capsule (1 mg total) by mouth at bedtime. TO BE TAKEN IN ADDITION TO THE 2 MG CAPSULE  . terazosin (HYTRIN) 2 MG capsule Take 1 capsule (2 mg total) by mouth at bedtime.  . [DISCONTINUED] acetaminophen-codeine (TYLENOL #3) 300-30 MG tablet Take 1 tablet daily by mouth.  . [DISCONTINUED] amoxicillin-clavulanate (AUGMENTIN) 875-125 MG tablet Take 1 tablet 2 (two) times daily by mouth.   No facility-administered encounter medications on file as of 04/23/2017.      REVIEW OF SYSTEMS  : All other systems reviewed and negative except where noted in the History of Present Illness.   PHYSICAL EXAM: BP (!) 144/62   Pulse 63   Ht 5\' 9"  (1.753 m) Comment: w/o shoes  Wt 201 lb (91.2 kg)   BMI 29.68 kg/m  General: Well developed white male in no acute distress Head: Normocephalic and atraumatic Eyes:  Sclerae anicteric, conjunctiva pink. Ears: Normal auditory acuity Lungs: Clear throughout to auscultation; no W/R/R. Heart: Regular rate and rhythm; no M/R/G. Abdomen: Soft, non-distended.  BS present.  Non-tender. Rectal:  Will be done at the time of colonoscopy. Musculoskeletal: Symmetrical with no gross deformities  Skin: No lesions on visible extremities Extremities: No edema  Neurological: Alert oriented x 4, grossly  non-focal Psychological:  Alert and cooperative. Normal mood and affect  ASSESSMENT AND PLAN: *Mild, normocytic anemia with normal iron studies and positive IFOB x 2:  Was here last year and supposed to have an EGD and colonoscopy but he was going to be having neck surgery.  He returns now to schedule those procedures.  Will schedule with Dr. Loletha Carrow.  **The risks, benefits, and alternatives to EGD and colonoscopy were discussed with the patient and he consents to proceed.   CC:  Dione Housekeeper, MD

## 2017-04-23 NOTE — Patient Instructions (Addendum)
If you are age 74 or older, your body mass index should be between 23-30. Your Body mass index is 29.68 kg/m. If this is out of the aforementioned range listed, please consider follow up with your Primary Care Provider.  If you are age 59 or younger, your body mass index should be between 19-25. Your Body mass index is 29.68 kg/m. If this is out of the aformentioned range listed, please consider follow up with your Primary Care Provider.   You have been scheduled for an endoscopy and colonoscopy. Please follow the written instructions given to you at your visit today. Please pick up your prep supplies at the pharmacy within the next 1-3 days. If you use inhalers (even only as needed), please bring them with you on the day of your procedure. Your physician has requested that you go to www.startemmi.com and enter the access code given to you at your visit today. This web site gives a general overview about your procedure. However, you should still follow specific instructions given to you by our office regarding your preparation for the procedure.

## 2017-04-24 ENCOUNTER — Telehealth: Payer: Self-pay | Admitting: Gastroenterology

## 2017-04-24 NOTE — Telephone Encounter (Signed)
Spoke to patients wife and informed her that we have a sample of suprep and they can come and pick it up. pahrmacy stated it would have been $48.

## 2017-04-24 NOTE — Progress Notes (Signed)
Thank you for sending this case to me. I have reviewed the entire note, and the outlined plan seems appropriate.  The significance of the heme positive in relation to the anemia remains unclear.  Agree with EGD/colonoscopy to discover that.  Wilfrid Lund, MD

## 2017-04-30 ENCOUNTER — Encounter: Payer: Self-pay | Admitting: Gastroenterology

## 2017-04-30 ENCOUNTER — Ambulatory Visit (AMBULATORY_SURGERY_CENTER): Payer: Medicare Other | Admitting: Gastroenterology

## 2017-04-30 ENCOUNTER — Other Ambulatory Visit: Payer: Self-pay

## 2017-04-30 VITALS — BP 126/80 | HR 69 | Temp 97.8°F | Resp 17 | Ht 69.0 in | Wt 201.0 lb

## 2017-04-30 DIAGNOSIS — R195 Other fecal abnormalities: Secondary | ICD-10-CM | POA: Diagnosis not present

## 2017-04-30 DIAGNOSIS — D123 Benign neoplasm of transverse colon: Secondary | ICD-10-CM

## 2017-04-30 DIAGNOSIS — D120 Benign neoplasm of cecum: Secondary | ICD-10-CM

## 2017-04-30 DIAGNOSIS — K921 Melena: Secondary | ICD-10-CM | POA: Diagnosis not present

## 2017-04-30 MED ORDER — SODIUM CHLORIDE 0.9 % IV SOLN: 500.0000 mL | Freq: Once | INTRAVENOUS | Status: DC

## 2017-04-30 NOTE — Patient Instructions (Signed)
YOU HAD AN ENDOSCOPIC PROCEDURE TODAY AT [facility]:   Refer to the procedure report that was given to you for any specific questions about what was found during the examination.  If the procedure report does not answer your questions, please call your gastroenterologist to clarify.  If you requested that your care partner not be given the details of your procedure findings, then the procedure report has been included in a sealed envelope for you to review at your convenience later.  YOU SHOULD EXPECT: Some feelings of bloating in the abdomen. Passage of more gas than usual.  Walking can help get rid of the air that was put into your GI tract during the procedure and reduce the bloating. If you had a lower endoscopy (such as a colonoscopy or flexible sigmoidoscopy) you may notice spotting of blood in your stool or on the toilet paper. If you underwent a bowel prep for your procedure, you may not have a normal bowel movement for a few days.  Please Note:  You might notice some irritation and congestion in your nose or some drainage.  This is from the oxygen used during your procedure.  There is no need for concern and it should clear up in a day or so.  SYMPTOMS TO REPORT IMMEDIATELY:   Following lower endoscopy (colonoscopy or flexible sigmoidoscopy):  Excessive amounts of blood in the stool  Significant tenderness or worsening of abdominal pains  Swelling of the abdomen that is new, acute  Fever of 100F or higher   Following upper endoscopy (EGD)  Vomiting of blood or coffee ground material  New chest pain or pain under the shoulder blades  Painful or persistently difficult swallowing  New shortness of breath  Fever of 100F or higher  Black, tarry-looking stools  For urgent or emergent issues, a gastroenterologist can be reached at any hour by calling [phone].   DIET:  We do recommend a small meal at first, but then you may proceed to your regular diet.  Drink  plenty of fluids but you should avoid alcoholic beverages for 24 hours.  ACTIVITY:  You should plan to take it easy for the rest of today and you should NOT DRIVE or use heavy machinery until tomorrow (because of the sedation medicines used during the test).    FOLLOW UP: Our staff will call the number listed on your records the next business day following your procedure to check on you and address any questions or concerns that you may have regarding the information given to you following your procedure. If we do not reach you, we will leave a message.  However, if you are feeling well and you are not experiencing any problems, there is no need to return our call.  We will assume that you have returned to your regular daily activities without incident.  If any biopsies were taken you will be contacted by phone or by letter within the next 1-3 weeks.  Please call us at [phone] if you have not heard about the biopsies in 3 weeks.    SIGNATURES/CONFIDENTIALITY: You and/or your care partner have signed paperwork which will be entered into your electronic medical record.  These signatures attest to the fact that that the information above on your After Visit Summary has been reviewed and is understood.  Full responsibility of the confidentiality of this discharge information lies with you and/or your care-partner.   Await pathology  No Aspirin, Aspirin containing products (BC or  Goodys), NSAIDS (Ibuprofen, Motrin, Aleve, Naproxen) for 5 days, Tylenol is ok to take  Continue other medications  Please read over handout about polyps

## 2017-04-30 NOTE — Progress Notes (Signed)
A and O x3. Report to RN. Tolerated MAC anesthesia well.Teeth unchanged after procedure.

## 2017-04-30 NOTE — Op Note (Signed)
[place] Patient Name: Jeffery Fernandez Procedure Date: 04/30/2017 3:15 PM MRN: [id number] Endoscopist: Mallie Mussel L. Loletha Carrow , MD Age: 72 Referring MD:  Date of Birth: 1945-12-25 Gender: Male Account #: [id number] Procedure:                Colonoscopy Indications:              Heme positive stool (Hgb 12 with normal MCV) Medicines:                Monitored Anesthesia Care Procedure:                Pre-Anesthesia Assessment:                           - Prior to the procedure, a History and Physical                            was performed, and patient medications and                            allergies were reviewed. The patient's tolerance of                            previous anesthesia was also reviewed. The risks                            and benefits of the procedure and the sedation                            options and risks were discussed with the patient.                            All questions were answered, and informed consent                            was obtained. Anticoagulants: The patient has taken                            aspirin. It was decided not to withhold this                            medication prior to the procedure. ASA Grade                            Assessment: II - A patient with mild systemic                            disease. After reviewing the risks and benefits,                            the patient was deemed in satisfactory condition to                            undergo the procedure.  After obtaining informed consent, the colonoscope                            was passed under direct vision. Throughout the                            procedure, the patient's blood pressure, pulse, and                            oxygen saturations were monitored continuously. The                            Model CF-HQ190L [id number]) scope was introduced                            through the anus and advanced to the the cecum,                             identified by appendiceal orifice and ileocecal                            valve. The colonoscopy was performed without                            difficulty. The patient tolerated the procedure                            well. The quality of the bowel preparation was                            good. The ileocecal valve, appendiceal orifice, and                            rectum were photographed. The quality of the bowel                            preparation was evaluated using the BBPS [facility]                            Bowel Preparation Scale) with scores of: Right                            Colon = 2, Transverse Colon = 2 and Left Colon = 2.                            The total BBPS score equals 6. After lavage. Scope In: 3:24:34 PM Scope Out: 3:37:39 PM Scope Withdrawal Time: 0 hours 11 minutes 0 seconds  Total Procedure Duration: 0 hours 13 minutes 5 seconds  Findings:                 The perianal and digital rectal examinations were  normal.                           A 2 mm polyp was found in the cecum. The polyp was                            sessile. The polyp was removed with a cold biopsy                            forceps. Resection and retrieval were complete.                           A 12 mm polyp was found in the mid transverse                            colon. The polyp was sessile. The polyp was removed                            with a hot snare. Resection and retrieval were                            complete.                           Many large-mouthed diverticula were found in the                            entire colon.                           The exam was otherwise without abnormality on                            direct and retroflexion views. Complications:            No immediate complications. Estimated Blood Loss:     Estimated blood loss was minimal. Impression:               - One 2 mm polyp in the  cecum, removed with a cold                            biopsy forceps. Resected and retrieved.                           - One 12 mm polyp in the mid transverse colon,                            removed with a hot snare. Resected and retrieved.                           - Diverticulosis in the entire examined colon.                           - The examination was otherwise normal on direct  and retroflexion views.                           No cause for heme positive stool seen. It is most                            likely unrelated to the mildly decreased hemoglobin. Recommendation:           - Patient has a contact number available for                            emergencies. The signs and symptoms of potential                            delayed complications were discussed with the                            patient. Return to normal activities tomorrow.                            Written discharge instructions were provided to the                            patient.                           - Resume previous diet.                           - No aspirin, ibuprofen, naproxen, or other                            non-steroidal anti-inflammatory drugs for 5 days                            after polyp removal.                           - Await pathology results.                           - Repeat colonoscopy is recommended for                            surveillance. The colonoscopy date will be                            determined after pathology results from today's                            exam become available for review.                           - Return to primary care physician as previously  scheduled. Henry L. Loletha Carrow, MD 04/30/2017 3:48:48 PM This report has been signed electronically.

## 2017-04-30 NOTE — Op Note (Signed)
[place] Patient Name: Jeffery Fernandez Procedure Date: 04/30/2017 3:15 PM MRN: [id number] Endoscopist: Mallie Mussel L. Loletha Fernandez , MD Age: 72 Referring MD:  Date of Birth: 21-Oct-1945 Gender: Male Account #: [id number] Procedure:                Upper GI endoscopy Indications:              Heme positive stool Medicines:                Monitored Anesthesia Care Procedure:                Pre-Anesthesia Assessment:                           - Prior to the procedure, a History and Physical                            was performed, and patient medications and                            allergies were reviewed. The patient's tolerance of                            previous anesthesia was also reviewed. The risks                            and benefits of the procedure and the sedation                            options and risks were discussed with the patient.                            All questions were answered, and informed consent                            was obtained. Anticoagulants: The patient has taken                            aspirin. It was decided not to withhold this                            medication prior to the procedure. ASA Grade                            Assessment: II - A patient with mild systemic                            disease. After reviewing the risks and benefits,                            the patient was deemed in satisfactory condition to                            undergo the procedure.  After obtaining informed consent, the endoscope was                            passed under direct vision. Throughout the                            procedure, the patient's blood pressure, pulse, and                            oxygen saturations were monitored continuously. The                            Endoscope was introduced through the mouth, and                            advanced to the second part of duodenum. The upper            GI endoscopy was accomplished without difficulty.                            The patient tolerated the procedure well. Scope In: Scope Out: Findings:                 The larynx was normal.                           The lower third of the esophagus was tortuous.                           The exam of the esophagus was otherwise normal.                           The stomach was normal.                           The cardia and gastric fundus were normal on                            retroflexion.                           The examined duodenum was normal. Complications:            No immediate complications. Estimated Blood Loss:     Estimated blood loss: none. Impression:               - Normal larynx.                           - Tortuous esophagus.                           - Normal stomach.                           - Normal examined duodenum.                           -  No specimens collected. Recommendation:           - Patient has a contact number available for                            emergencies. The signs and symptoms of potential                            delayed complications were discussed with the                            patient. Return to normal activities tomorrow.                            Written discharge instructions were provided to the                            patient.                           - Resume previous diet.                           - Continue present medications.                           - See the other procedure note for documentation of                            additional recommendations. Jeffery L. Loletha Carrow, MD 04/30/2017 3:44:11 PM This report has been signed electronically.

## 2017-04-30 NOTE — Progress Notes (Signed)
Called to room to assist during endoscopic procedure.  Patient ID and intended procedure confirmed with present staff. Received instructions for my participation in the procedure from the performing physician.  

## 2017-05-01 ENCOUNTER — Telehealth: Payer: Self-pay

## 2017-05-01 NOTE — Telephone Encounter (Signed)
  Follow up Call-  Call back number 04/30/2017  Post procedure Call Back phone  # [phone]  Permission to leave phone message Yes  Some recent data might be hidden     Patient questions:  Do you have a fever, pain , or abdominal swelling? No. Pain Score  0 *  Have you tolerated food without any problems? Yes.    Have you been able to return to your normal activities? Yes.    Do you have any questions about your discharge instructions: Diet   No. Medications  No. Follow up visit  No.  Do you have questions or concerns about your Care? No.  Actions: * If pain score is 4 or above: No action needed, pain <4.

## 2017-05-07 ENCOUNTER — Encounter: Payer: Self-pay | Admitting: Gastroenterology

## 2017-06-04 ENCOUNTER — Encounter

## 2017-06-04 ENCOUNTER — Ambulatory Visit: Payer: Medicare Other | Admitting: Gastroenterology

## 2017-06-14 ENCOUNTER — Other Ambulatory Visit: Payer: Self-pay | Admitting: Cardiovascular Disease

## 2017-06-16 NOTE — Telephone Encounter (Signed)
Rx has been sent to the pharmacy electronically. ° °

## 2017-06-19 ENCOUNTER — Telehealth: Payer: Self-pay | Admitting: Cardiovascular Disease

## 2017-06-19 MED ORDER — IRBESARTAN 300 MG PO TABS: 300.0000 mg | ORAL_TABLET | Freq: Every day | ORAL | 0 refills | Status: DC

## 2017-06-19 MED ORDER — HYDROCHLOROTHIAZIDE 12.5 MG PO CAPS: 12.5000 mg | ORAL_CAPSULE | Freq: Every day | ORAL | 0 refills | Status: DC

## 2017-06-19 NOTE — Telephone Encounter (Signed)
Irbesartan 300 mg and Hydrochlorothiazide 12.5 mg has been sent into the pharmacy. The patient will keep monitoring his blood pressure and call back about the cost. The patient's wife has been made aware that the patient needs to have an appointment with the hypertension clinic. Scheduling has been made aware.

## 2017-06-19 NOTE — Telephone Encounter (Signed)
Please try sending Rx for Irbesartan and Rx for HCTZ (2 separate Rx)  Patient cancelled most recent BP clinic appointment and his BP is very unstable. Will need assessment prior to making additional medication changes.

## 2017-06-19 NOTE — Telephone Encounter (Signed)
Left a message to call back.

## 2017-06-19 NOTE — Telephone Encounter (Signed)
New message    Pt c/o medication issue:  1. Name of Medication: irbesartan-hydrochlorothiazide (AVALIDE) 300-12.5 MG tablet  2. How are you currently taking this medication (dosage and times per day)? TAKE 1 TABLET BY MOUTH DAILY. TAKE PLACE OF LOSARTAN  3. Are you having a reaction (difficulty breathing--STAT)? No  4. What is your medication issue? Medication too costly $53.00 for 90 day supply

## 2017-06-19 NOTE — Telephone Encounter (Signed)
The patient's wife called in stating that the patient's irbesartan-hydrochlorothiazide costs too much and they would like to know if there is something cheaper for them. His blood pressure while on the phone was 145/75 which he stated is his typical.

## 2017-06-20 ENCOUNTER — Telehealth: Payer: Self-pay | Admitting: *Deleted

## 2017-06-20 MED ORDER — LISINOPRIL 40 MG PO TABS: 40.0000 mg | ORAL_TABLET | Freq: Every day | ORAL | 3 refills | Status: DC

## 2017-06-20 NOTE — Telephone Encounter (Signed)
Mrs. Blancett spoke with Lattie Haw yesterday and Mr. Folmar medication was changed and the cost did not change.  Mr. Pinard has an appointment in the HTN Clinic on Tuesday 06/24/17 @ 1:30pm.  Please call so medication can be discussed

## 2017-06-20 NOTE — Telephone Encounter (Signed)
Spoke with pt wife and informed of pharm D recommendation. Verbalized understanding. Irbesartan 300 mg discontinued and new prescription sent to pharmacy for lisinopril 40 mg.

## 2017-06-20 NOTE — Telephone Encounter (Addendum)
Spoke with pt wife who states the new prescription with Irbesartan and HCTZ separate is still too expensive. Irbesartan $42 and HCTZ $8. Wife verbalized that its about the same cost as the combo medication. Routing to Pharm D for recommendation.   Included previous encounter.    Ricci Barker, RN       06/19/17 2:12 PM  Note    Irbesartan 300 mg and Hydrochlorothiazide 12.5 mg has been sent into the pharmacy. The patient will keep monitoring his blood pressure and call back about the cost. The patient's wife has been made aware that the patient needs to have an appointment with the hypertension clinic. Scheduling has been made aware.             06/19/17 2:08 PM    Tecson,Ann (Emergency Contact) contacted Ricci Barker, RN     Ricci Barker, RN       06/19/17 1:55 PM  Note    Left a message to call back            06/19/17 1:54 PM    Ricci Barker, RN attempted to contact [facility] (Emergency Contact) (Left Message)           06/19/17 1:44 PM  Rodriguez-Guzman, Raquel, [place] routed this conversation to Ricci Barker, RN    Rodriguez-Guzman, Raquel, [facility]       06/19/17 1:41 PM  Note    Please try sending Rx for Irbesartan and Rx for HCTZ (2 separate Rx)  Patient cancelled most recent BP clinic appointment and his BP is very unstable. Will need assessment prior to making additional medication changes.

## 2017-06-20 NOTE — Telephone Encounter (Signed)
Follow up    Patients wife Jeffery Fernandez is returning call in reference to medication. Please call.

## 2017-06-20 NOTE — Telephone Encounter (Signed)
Left message to call back  

## 2017-06-20 NOTE — Telephone Encounter (Signed)
Have him try lisinopril 40 mg plus hydrochlorothiazide 12.5 mg.  Cannot get this in a combo tablet so will have to be two separate.

## 2017-06-23 NOTE — Progress Notes (Signed)
06/25/2017 Jeffery Fernandez 18-Sep-1945 [id number]   HPI:  Jeffery Fernandez is a 72 y.o. male patient of Dr Jeffery Fernandez, with a [place] below who presents today for hypertension clinic evaluation.  In addition to hypertension, his medical history is significant for CAD, hyperlipidemia, DM2, COPD, and LBBB.  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    He has been seen several times by Dr. Oval Fernandez as well as [facility], and his medications have been adjusted to deal with the continually elevated pressure as well as some dizziness at times.  His current medications are listed below.  He has no issues with compliance and has been feeling well lately.    Blood Pressure Goal:  130/80  Current Medications:  Lisinopril 40 mg - evenings  HCTZ 12.5 mg qd - evenings  Amlodipine 2.5 mg - mornings  Carvedilol 25 mg twice daily  Terazosin 3 mg - evenings (1 + 2 mg capsules)  Family Hx:  Brother with hypertension, sister with hypertension  Social Hx:  Former smoker, quit more than 30 years ago, no alcohol.  Diet:  Mostly home cooked meals, does add salt to eggs and canteloupe; plenty of canned veggies; protein is mostly chicken or beef; no pasta.  Exercise:  Twin great - grandchildren 15 months - babysits on weekdays; does own yard work, walk in evenings with wife  Home BP readings:  Home BP readings (10) average 146/76.    Labs:    10-2017:  Glu 187, Na 141, K 3.9, BUN 23, SCr 1.45  CrCl 26  Wt Readings from Last 3 Encounters:  04/30/17 201 lb (91.2 kg)  04/23/17 201 lb (91.2 kg)  04/07/17 204 lb 6.4 oz (92.7 kg)   BP Readings from Last 3 Encounters:  06/25/17 (!) 142/64  04/30/17 126/80  04/23/17 (!) 144/62   Pulse Readings from Last 3 Encounters:  06/25/17 72  04/30/17 69  04/23/17 63    Current Outpatient Medications  Medication Sig Dispense Refill  . acetaminophen (TYLENOL) 500  MG tablet Take 1,000 mg by mouth every 6 (six) hours as needed (for pain.).    Marland Kitchen amLODipine (NORVASC) 2.5 MG tablet Take 1 tablet (2.5 mg total) by mouth daily. 30 tablet 5  . aspirin 325 MG EC tablet Take 325 mg by mouth daily.    Marland Kitchen atorvastatin (LIPITOR) 40 MG tablet Take 20 mg by mouth at bedtime.     . carvedilol (COREG) 25 MG tablet Take 1 tablet (25 mg total) by mouth 2 (two) times daily with a meal. 30 tablet 11  . glipiZIDE (GLUCOTROL XL) 10 MG 24 hr tablet Take 10 mg by mouth 2 (two) times daily.    . hydrALAZINE (APRESOLINE) 25 MG tablet 1 TABLET EVERY 6 HOURS AS NEEDED FOR SYSTOLIC BLOOD PRESSURE 409 OR GREATER 60 tablet 5  . hydrochlorothiazide (MICROZIDE) 12.5 MG capsule Take 1 capsule (12.5 mg total) by mouth daily. 90 capsule 0  . Ketotifen Fumarate (ITCHY EYE DROPS OP) Place 1 drop into both eyes daily as needed (itching/allergies).     Marland Kitchen lisinopril (PRINIVIL,ZESTRIL) 40 MG tablet Take 1 tablet (40 mg total) by mouth daily. 90 tablet 3  . metFORMIN (GLUCOPHAGE) 500 MG tablet Take 1,000 mg by mouth 2 (two) times daily with a meal.     . pantoprazole (PROTONIX) 40 MG tablet Take 40  mg by mouth daily.    . sertraline (ZOLOFT) 100 MG tablet Take 100 mg daily by mouth.     . sitaGLIPtan (JANUVIA) 100 MG tablet Take 100 mg by mouth daily.      Marland Kitchen terazosin (HYTRIN) 1 MG capsule Take 1 capsule (1 mg total) by mouth at bedtime. TO BE TAKEN IN ADDITION TO THE 2 MG CAPSULE 90 capsule 3  . terazosin (HYTRIN) 2 MG capsule Take 1 capsule (2 mg total) by mouth at bedtime. 30 capsule 4   Current Facility-Administered Medications  Medication Dose Route Frequency Provider Last Rate Last Dose  . 0.9 %  sodium chloride infusion  500 mL Intravenous Once Doran Stabler, MD        Allergies  Allergen Reactions  . Penicillins Other (See Comments)    Possibly rash - not sure Has patient had a PCN reaction causing immediate rash, facial/tongue/throat swelling, SOB or lightheadedness with  hypotension:unknown Has patient had a PCN reaction causing severe rash involving mucus membranes or skin necrosis: Unknown Has patient had a PCN reaction that required hospitalization: Unknown Has patient had a PCN reaction occurring within the last 10 years: No Childhood reaction If all of the above answers are "NO", then may proceed with Cephalosporin use.     Past Medical History:  Diagnosis Date  . Arrhythmia   . Arthritis   . Cataract    bilateral cataract extraction with intraoccular lens implants  . Chest pain, unspecified   . Chronic airway obstruction, not elsewhere classified    PT. DENIES HE HAS COPD  . CKD (chronic kidney disease)   . Colon polyps    Adenomatous Polyps 2007  . Depression   . Diabetes mellitus   . Diverticulosis   . Gout   . Heart murmur   . Hyperlipemia   . Hypertension   . Nephrolithiasis   . Normocytic anemia 04/23/2017  . Obstructive sleep apnea (adult) (pediatric)   . Other and unspecified hyperlipidemia   . Other left bundle branch block   . Retinal vascular occlusion, unspecified    right  . Shortness of breath    on exertion  . Unspecified sleep apnea   . Vision loss of right eye     Blood pressure (!) 142/64, pulse 72.  Hypertension Patient with hypertension, still not at current goal.  He is hesitant to add medications, so I have asked that he continue with daily home BP checks.  He will return in 4 weeks with his home BP cuff, as well as readings.  With his low creatinine clearance, I am hesitant to switch his hydrochlorothiazide to chlorthalidone.  He previously had facial swelling with higher doses of amlodipine.  Would consider adding hydralazine.  Will also recommend getting BMET at his next visit.     Tommy Medal PharmD CPP [facility] [address] [place], [place] [address] [phone]

## 2017-06-24 ENCOUNTER — Ambulatory Visit (INDEPENDENT_AMBULATORY_CARE_PROVIDER_SITE_OTHER): Payer: Medicare Other | Admitting: Pharmacist Clinician (PhC)/ Clinical Pharmacy Specialist

## 2017-06-24 DIAGNOSIS — I10 Essential (primary) hypertension: Secondary | ICD-10-CM | POA: Diagnosis not present

## 2017-06-24 DIAGNOSIS — I2510 Atherosclerotic heart disease of native coronary artery without angina pectoris: Secondary | ICD-10-CM | POA: Diagnosis not present

## 2017-06-24 NOTE — Patient Instructions (Signed)
Return for a a follow up appointment in 1 month  Your blood pressure today is 142/64   Check your blood pressure at home daily and keep record of the readings.  Take your BP meds as follows:  Continue with all your current medications  Bring all of your meds, your BP cuff and your record of home blood pressures to your next appointment.  Exercise as you're able, try to walk approximately 30 minutes per day.  Keep salt intake to a minimum, especially watch canned and prepared boxed foods.  Eat more fresh fruits and vegetables and fewer canned items.  Avoid eating in fast food restaurants.    HOW TO TAKE YOUR BLOOD PRESSURE: . Rest 5 minutes before taking your blood pressure. .  Don't smoke or drink caffeinated beverages for at least 30 minutes before. . Take your blood pressure before (not after) you eat. . Sit comfortably with your back supported and both feet on the floor (don't cross your legs). . Elevate your arm to heart level on a table or a desk. . Use the proper sized cuff. It should fit smoothly and snugly around your bare upper arm. There should be enough room to slip a fingertip under the cuff. The bottom edge of the cuff should be 1 inch above the crease of the elbow. . Ideally, take 3 measurements at one sitting and record the average.

## 2017-06-25 ENCOUNTER — Encounter: Payer: Self-pay | Admitting: Pharmacist Clinician (PhC)/ Clinical Pharmacy Specialist

## 2017-06-25 NOTE — Assessment & Plan Note (Signed)
Patient with hypertension, still not at current goal.  He is hesitant to add medications, so I have asked that he continue with daily home BP checks.  He will return in 4 weeks with his home BP cuff, as well as readings.  With his low creatinine clearance, I am hesitant to switch his hydrochlorothiazide to chlorthalidone.  He previously had facial swelling with higher doses of amlodipine.  Would consider adding hydralazine.  Will also recommend getting BMET at his next visit.

## 2017-07-17 ENCOUNTER — Ambulatory Visit (INDEPENDENT_AMBULATORY_CARE_PROVIDER_SITE_OTHER): Payer: Medicare Other | Admitting: Pharmacist Clinician (PhC)/ Clinical Pharmacy Specialist

## 2017-07-17 DIAGNOSIS — I2510 Atherosclerotic heart disease of native coronary artery without angina pectoris: Secondary | ICD-10-CM

## 2017-07-17 DIAGNOSIS — I10 Essential (primary) hypertension: Secondary | ICD-10-CM

## 2017-07-17 MED ORDER — TERAZOSIN HCL 5 MG PO CAPS: 5.0000 mg | ORAL_CAPSULE | Freq: Every day | ORAL | 5 refills | Status: DC

## 2017-07-17 NOTE — Patient Instructions (Signed)
Return for a a follow up appointment Wednesday July 31 at 9:30am  Your blood pressure today is 162/80  Check your blood pressure at home daily and keep record of the readings.  Take your BP meds as follows:  AM:  Hydrochlorothiazide 12.5mg , amlodipine 2.5mg  , carvedilol 25 mg  PM: Lisinopril 40 mg, carvedilol 25mg , terazosin 5 mg  Bring all of your meds, your BP cuff and your record of home blood pressures to your next appointment.  Exercise as you're able, try to walk approximately 30 minutes per day.  Keep salt intake to a minimum, especially watch canned and prepared boxed foods.  Eat more fresh fruits and vegetables and fewer canned items.  Avoid eating in fast food restaurants.    HOW TO TAKE YOUR BLOOD PRESSURE: . Rest 5 minutes before taking your blood pressure. .  Don't smoke or drink caffeinated beverages for at least 30 minutes before. . Take your blood pressure before (not after) you eat. . Sit comfortably with your back supported and both feet on the floor (don't cross your legs). . Elevate your arm to heart level on a table or a desk. . Use the proper sized cuff. It should fit smoothly and snugly around your bare upper arm. There should be enough room to slip a fingertip under the cuff. The bottom edge of the cuff should be 1 inch above the crease of the elbow. . Ideally, take 3 measurements at one sitting and record the average.

## 2017-07-17 NOTE — Progress Notes (Signed)
07/17/2017 Shields Pautz Baquero July 11, 1945 [id number]   HPI:  Jeffery Fernandez is a 72 y.o. male patient of Dr Oval Linsey, with a Ione below who presents today for hypertension clinic evaluation.  In addition to hypertension, his medical history is significant for CAD, hyperlipidemia, DM2, COPD, and LBBB.  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    He has been seen several times by Dr. Oval Linsey as well as [facility], and his medications have been adjusted to deal with the continually elevated pressure as well as some dizziness at times.  His current medications are listed below.  He has no issues with compliance and has been feeling well lately.   Recently he had an injection in one eye because of swelling, and he admits that he was terrified and his pressure was probably higher around then.  States it wasn't as bad as he thought, but not excited to have it done again.    Blood Pressure Goal:  130/80  Current Medications:  Lisinopril 40 mg - evenings  HCTZ 12.5 mg qd - evenings  Amlodipine 2.5 mg - mornings  Carvedilol 25 mg twice daily  Terazosin 3 mg - evenings (1 + 2 mg capsules)  Hydralazine 25 mg prn systolic > 361  (has not used)  Family Hx:  Brother with hypertension, sister with hypertension  Social Hx:  Former smoker, quit more than 30 years ago, no alcohol.  Diet:  Mostly home cooked meals, does add salt to eggs and canteloupe; plenty of canned veggies; protein is mostly chicken or beef; no pasta.  Exercise:  Twin great - grandchildren 15 months - babysits on weekdays; does own yard work, walk in evenings with wife  Home BP readings:  Omron home cuff, just few years old.  Last 12 readings average 143/79.  Home cuff read within 10 points of office cuff, home cuff was lower.  Labs:    10-2016:  Glu 187, Na 141, K 3.9, BUN 23, SCr 1.45  CrCl 59.5  02-2017:  Glu 140, Na  144, K 3.6, BUN 27, SCr 1.61  CrCl 52.8  Wt Readings from Last 3 Encounters:  04/30/17 201 lb (91.2 kg)  04/23/17 201 lb (91.2 kg)  04/07/17 204 lb 6.4 oz (92.7 kg)   BP Readings from Last 3 Encounters:  07/17/17 (!) 162/8  06/25/17 (!) 142/64  04/30/17 126/80   Pulse Readings from Last 3 Encounters:  07/17/17 60  06/25/17 72  04/30/17 69    Current Outpatient Medications  Medication Sig Dispense Refill  . acetaminophen (TYLENOL) 500 MG tablet Take 1,000 mg by mouth every 6 (six) hours as needed (for pain.).    Marland Kitchen amLODipine (NORVASC) 2.5 MG tablet Take 1 tablet (2.5 mg total) by mouth daily. 30 tablet 5  . aspirin 325 MG EC tablet Take 325 mg by mouth daily.    Marland Kitchen atorvastatin (LIPITOR) 40 MG tablet Take 20 mg by mouth at bedtime.     . carvedilol (COREG) 25 MG tablet Take 1 tablet (25 mg total) by mouth 2 (two) times daily with a meal. 30 tablet 11  . glipiZIDE (GLUCOTROL XL) 10 MG 24 hr tablet Take 10 mg by mouth 2 (two) times daily.    . hydrALAZINE (APRESOLINE) 25 MG tablet 1 TABLET EVERY 6 HOURS AS NEEDED FOR SYSTOLIC BLOOD PRESSURE 443 OR  GREATER 60 tablet 5  . hydrochlorothiazide (MICROZIDE) 12.5 MG capsule Take 1 capsule (12.5 mg total) by mouth daily. 90 capsule 0  . Ketotifen Fumarate (ITCHY EYE DROPS OP) Place 1 drop into both eyes daily as needed (itching/allergies).     Marland Kitchen lisinopril (PRINIVIL,ZESTRIL) 40 MG tablet Take 1 tablet (40 mg total) by mouth daily. 90 tablet 3  . metFORMIN (GLUCOPHAGE) 500 MG tablet Take 1,000 mg by mouth 2 (two) times daily with a meal.     . pantoprazole (PROTONIX) 40 MG tablet Take 40 mg by mouth daily.    . sertraline (ZOLOFT) 100 MG tablet Take 100 mg daily by mouth.     . sitaGLIPtan (JANUVIA) 100 MG tablet Take 100 mg by mouth daily.      Marland Kitchen terazosin (HYTRIN) 5 MG capsule Take 1 capsule (5 mg total) by mouth at bedtime. 30 capsule 5   Current Facility-Administered Medications  Medication Dose Route Frequency Provider Last Rate Last  Dose  . 0.9 %  sodium chloride infusion  500 mL Intravenous Once Doran Stabler, MD        Allergies  Allergen Reactions  . Penicillins Other (See Comments)    Possibly rash - not sure Has patient had a PCN reaction causing immediate rash, facial/tongue/throat swelling, SOB or lightheadedness with hypotension:unknown Has patient had a PCN reaction causing severe rash involving mucus membranes or skin necrosis: Unknown Has patient had a PCN reaction that required hospitalization: Unknown Has patient had a PCN reaction occurring within the last 10 years: No Childhood reaction If all of the above answers are "NO", then may proceed with Cephalosporin use.     Past Medical History:  Diagnosis Date  . Arrhythmia   . Arthritis   . Cataract    bilateral cataract extraction with intraoccular lens implants  . Chest pain, unspecified   . Chronic airway obstruction, not elsewhere classified    PT. DENIES HE HAS COPD  . CKD (chronic kidney disease)   . Colon polyps    Adenomatous Polyps 2007  . Depression   . Diabetes mellitus   . Diverticulosis   . Gout   . Heart murmur   . Hyperlipemia   . Hypertension   . Nephrolithiasis   . Normocytic anemia 04/23/2017  . Obstructive sleep apnea (adult) (pediatric)   . Other and unspecified hyperlipidemia   . Other left bundle branch block   . Retinal vascular occlusion, unspecified    right  . Shortness of breath    on exertion  . Unspecified sleep apnea   . Vision loss of right eye     Blood pressure (!) 162/8, pulse 60.  Hypertension Blood pressure still not to goal.  Patient is hesitant to take more medications or increase doses of what he currently takes.  Explained that we need to either increase one of his meds or add in hydralazine on a daily basis as opposed to just as needed.  He is agreeable to increase the terazosin today.  Currently he takes 3 mg, so will increase to 5 mg daily.  He will continue to monitor home readings and  return in 6 weeks for follow up.  We may need to consider switching hctz for chlorthalidone, but just watch his renal function.     Tommy Medal PharmD CPP [facility] [address] [place], [place] [address] [phone]

## 2017-07-17 NOTE — Assessment & Plan Note (Signed)
Blood pressure still not to goal.  Patient is hesitant to take more medications or increase doses of what he currently takes.  Explained that we need to either increase one of his meds or add in hydralazine on a daily basis as opposed to just as needed.  He is agreeable to increase the terazosin today.  Currently he takes 3 mg, so will increase to 5 mg daily.  He will continue to monitor home readings and return in 6 weeks for follow up.  We may need to consider switching hctz for chlorthalidone, but just watch his renal function.

## 2017-08-27 ENCOUNTER — Ambulatory Visit: Payer: Medicare Other

## 2017-09-02 ENCOUNTER — Ambulatory Visit (INDEPENDENT_AMBULATORY_CARE_PROVIDER_SITE_OTHER): Payer: Medicare Other | Admitting: Pharmacist Clinician (PhC)/ Clinical Pharmacy Specialist

## 2017-09-02 DIAGNOSIS — I2510 Atherosclerotic heart disease of native coronary artery without angina pectoris: Secondary | ICD-10-CM | POA: Diagnosis not present

## 2017-09-02 DIAGNOSIS — I10 Essential (primary) hypertension: Secondary | ICD-10-CM

## 2017-09-02 NOTE — Progress Notes (Signed)
09/02/2017 Jeffery Fernandez 1945/12/16 [id number]   HPI:  Jeffery Fernandez is a 72 y.o. male patient of Dr Jeffery Fernandez, with a O'Brien below who presents today for hypertension clinic evaluation.  In addition to hypertension, his medical history is significant for CAD, hyperlipidemia, DM2, COPD, and LBBB.  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    He has been seen several times by Dr. Oval Fernandez as well as [facility], and his medications have been adjusted to deal with the continually elevated pressure as well as some dizziness at times.  His current medications are listed below.  He has no issues with compliance and has been feeling well lately.   He has noted that his blood pressure tends to spike a day or two before going to the ophthalmologist, as they have done a couple of injections into his left eye.  His next appointment is tomorrow.    Blood Pressure Goal:  130/80  Current Medications:  Lisinopril 40 mg - evenings  HCTZ 12.5 mg qd - evenings  Amlodipine 2.5 mg - mornings  Carvedilol 25 mg twice daily  Terazosin 5 mg - evenings   Hydralazine 25 mg prn systolic > 812  (has not used)  Family Hx:  Brother with hypertension, sister with hypertension  Social Hx:  Former smoker, quit more than 30 years ago, no alcohol.  Diet:  Mostly home cooked meals, does add salt to eggs and canteloupe; plenty of canned veggies; protein is mostly chicken or beef; no pasta.  Exercise:  Twin great - grandchildren 15 months - babysits on weekdays; does own yard work, walk in evenings with wife (weather permitting)  Home BP readings:  Omron home cuff, just few years old.  Last 12 readings average 133/74 (down 10/5 points since last month).  Home cuff read within 10 points of office cuff, home cuff was lower.  Labs:    10-2016:  Glu 187, Na 141, K 3.9, BUN 23, SCr 1.45  CrCl 59.5  02-2017:  Glu  140, Na 144, K 3.6, BUN 27, SCr 1.61  CrCl 52.8  Wt Readings from Last 3 Encounters:  04/30/17 201 lb (91.2 kg)  04/23/17 201 lb (91.2 kg)  04/07/17 204 lb 6.4 oz (92.7 kg)   BP Readings from Last 3 Encounters:  07/17/17 (!) 162/8  06/25/17 (!) 142/64  04/30/17 126/80   Pulse Readings from Last 3 Encounters:  07/17/17 60  06/25/17 72  04/30/17 69    Current Outpatient Medications  Medication Sig Dispense Refill  . acetaminophen (TYLENOL) 500 MG tablet Take 1,000 mg by mouth every 6 (six) hours as needed (for pain.).    Marland Kitchen amLODipine (NORVASC) 2.5 MG tablet Take 1 tablet (2.5 mg total) by mouth daily. 30 tablet 5  . aspirin 325 MG EC tablet Take 325 mg by mouth daily.    Marland Kitchen atorvastatin (LIPITOR) 40 MG tablet Take 20 mg by mouth at bedtime.     . carvedilol (COREG) 25 MG tablet Take 1 tablet (25 mg total) by mouth 2 (two) times daily with a meal. 30 tablet 11  . glipiZIDE (GLUCOTROL XL) 10 MG 24 hr tablet Take 10 mg by mouth 2 (two) times daily.    . hydrALAZINE (APRESOLINE) 25 MG tablet 1 TABLET EVERY 6 HOURS AS NEEDED FOR SYSTOLIC BLOOD PRESSURE 751 OR GREATER 60  tablet 5  . hydrochlorothiazide (MICROZIDE) 12.5 MG capsule Take 1 capsule (12.5 mg total) by mouth daily. 90 capsule 0  . Ketotifen Fumarate (ITCHY EYE DROPS OP) Place 1 drop into both eyes daily as needed (itching/allergies).     Marland Kitchen lisinopril (PRINIVIL,ZESTRIL) 40 MG tablet Take 1 tablet (40 mg total) by mouth daily. 90 tablet 3  . metFORMIN (GLUCOPHAGE) 500 MG tablet Take 1,000 mg by mouth 2 (two) times daily with a meal.     . pantoprazole (PROTONIX) 40 MG tablet Take 40 mg by mouth daily.    . sertraline (ZOLOFT) 100 MG tablet Take 100 mg daily by mouth.     . sitaGLIPtan (JANUVIA) 100 MG tablet Take 100 mg by mouth daily.      Marland Kitchen terazosin (HYTRIN) 5 MG capsule Take 1 capsule (5 mg total) by mouth at bedtime. 30 capsule 5   Current Facility-Administered Medications  Medication Dose Route Frequency Provider Last Rate  Last Dose  . 0.9 %  sodium chloride infusion  500 mL Intravenous Once Doran Stabler, MD        Allergies  Allergen Reactions  . Penicillins Other (See Comments)    Possibly rash - not sure Has patient had a PCN reaction causing immediate rash, facial/tongue/throat swelling, SOB or lightheadedness with hypotension:unknown Has patient had a PCN reaction causing severe rash involving mucus membranes or skin necrosis: Unknown Has patient had a PCN reaction that required hospitalization: Unknown Has patient had a PCN reaction occurring within the last 10 years: No Childhood reaction If all of the above answers are "NO", then may proceed with Cephalosporin use.     Past Medical History:  Diagnosis Date  . Arrhythmia   . Arthritis   . Cataract    bilateral cataract extraction with intraoccular lens implants  . Chest pain, unspecified   . Chronic airway obstruction, not elsewhere classified    PT. DENIES HE HAS COPD  . CKD (chronic kidney disease)   . Colon polyps    Adenomatous Polyps 2007  . Depression   . Diabetes mellitus   . Diverticulosis   . Gout   . Heart murmur   . Hyperlipemia   . Hypertension   . Nephrolithiasis   . Normocytic anemia 04/23/2017  . Obstructive sleep apnea (adult) (pediatric)   . Other and unspecified hyperlipidemia   . Other left bundle branch block   . Retinal vascular occlusion, unspecified    right  . Shortness of breath    on exertion  . Unspecified sleep apnea   . Vision loss of right eye     There were no vitals taken for this visit.  No problem-specific Assessment & Plan notes found for this encounter.   Tommy Medal PharmD CPP [facility] [address] [place], [place] [address] [phone]

## 2017-09-02 NOTE — Patient Instructions (Addendum)
Return for a a follow up appointment in 3 months  Your blood pressure today is 166/82 in the office, home average from past 12 mornings was 133/74  Check your blood pressure at home daily (if able) and keep record of the readings.  Take your BP meds as follows:  Continue with all current medications  Bring all of your meds, your BP cuff and your record of home blood pressures to your next appointment.  Exercise as you're able, try to walk approximately 30 minutes per day.  Keep salt intake to a minimum, especially watch canned and prepared boxed foods.  Eat more fresh fruits and vegetables and fewer canned items.  Avoid eating in fast food restaurants.    HOW TO TAKE YOUR BLOOD PRESSURE: . Rest 5 minutes before taking your blood pressure. .  Don't smoke or drink caffeinated beverages for at least 30 minutes before. . Take your blood pressure before (not after) you eat. . Sit comfortably with your back supported and both feet on the floor (don't cross your legs). . Elevate your arm to heart level on a table or a desk. . Use the proper sized cuff. It should fit smoothly and snugly around your bare upper arm. There should be enough room to slip a fingertip under the cuff. The bottom edge of the cuff should be 1 inch above the crease of the elbow. . Ideally, take 3 measurements at one sitting and record the average.

## 2017-09-02 NOTE — Assessment & Plan Note (Signed)
Patient with essential hypertension, seemingly well controlled at home.  He did have an elevated reading in the office this morning, however he is going to the ophthalmologist tomorrow.  With the increase in terazosin last month, his pressure dropped 10/5 on average.  I have asked him to continue with his current medications.   We will see him back in 3 months.  At that time if he is still having regularly elevated readings, I would consider switching his hydrochlorothiazide to chlorthalidone for better 24 hour control.  Could also consider increasing amlodipine to 5 mg, but would have to look back in his chart to see if he had previous edema problems with higher doses.

## 2017-09-14 ENCOUNTER — Other Ambulatory Visit: Payer: Self-pay | Admitting: Cardiovascular Disease

## 2017-09-19 ENCOUNTER — Ambulatory Visit (INDEPENDENT_AMBULATORY_CARE_PROVIDER_SITE_OTHER): Payer: Medicare Other | Admitting: Cardiovascular Disease

## 2017-09-19 ENCOUNTER — Encounter: Payer: Self-pay | Admitting: Cardiovascular Disease

## 2017-09-19 VITALS — BP 166/78 | HR 61 | Ht 70.0 in | Wt 208.0 lb

## 2017-09-19 DIAGNOSIS — I2510 Atherosclerotic heart disease of native coronary artery without angina pectoris: Secondary | ICD-10-CM

## 2017-09-19 DIAGNOSIS — I10 Essential (primary) hypertension: Secondary | ICD-10-CM | POA: Insufficient documentation

## 2017-09-19 DIAGNOSIS — E7800 Pure hypercholesterolemia, unspecified: Secondary | ICD-10-CM | POA: Diagnosis not present

## 2017-09-19 HISTORY — DX: Essential (primary) hypertension: I10

## 2017-09-19 MED ORDER — FLUTICASONE PROPIONATE 50 MCG/ACT NA SUSP: 1.0000 | Freq: Every day | NASAL | 0 refills | Status: DC

## 2017-09-19 NOTE — Patient Instructions (Addendum)
Medication Instructions:  FLONASE 1 SPRAY EACH NOSTRIL DAILY   Labwork: NONE  Testing/Procedures: NONE  Follow-Up: Your physician wants you to follow-up in: 6 MONTHS  You will receive a reminder letter in the mail two months in advance. If you don't receive a letter, please call our office to schedule the follow-up appointment.  If you need a refill on your cardiac medications before your next appointment, please call your pharmacy.

## 2017-09-19 NOTE — Progress Notes (Signed)
Cardiology Office Note   Date:  09/19/2017   ID:  Anil, Havard 1945/09/14, MRN [id number]  PCP:  Dione Housekeeper, MD  Cardiologist:   Skeet Latch, MD   No chief complaint on file.     History of Present Illness: Jeffery Fernandez is a 72 y.o. male medically managed CAD, hypertension, hyperlipidemia, diabetes, COPD, LBBB, and retinal artery occlusion who presents for follow-up.  He was initially seen for pre-surgical risk assessment 05/2016 prior to cervical spine surgery.  He was found to be at acceptable risk for surgery.  At that appointment his blood pressure was poorly-controlled.  Metoprolol was switched to carvedilol.  He followed up with our pharmacist and his blood pressure remained poorly-controlled so hydralazine was added to his regimen.  He stopped taking this due to dizziness, though his blood pressure still was not well-controlled.  Losartan was switched to irbesartan.  Carvedilol was switched to nebivolol due to fatigue and terazosin was reduced.  His dizziness did not improve so he was switched back to carvedilol.  He underwent C3 through 6 anterior cervical discectomy/decompression with insertion of bone graft and interbody prostheses on 05/2016.  He developed an anterior cervical wound hematoma and possible CSF leak.  He underwent reexploration on 5/30 and was discharged home 6/1.    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  He had an echo and exercise Cardiolite that were unremarkable.  He also wore a 21 day event monitor for three weeks and no atrial fibrillation was noted.  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  In retrospect he thinks that this is what was wrong with his right eye.  Jeffery Fernandez was seen for shortness of breath in 2012 and was thought to have a COPD exacerbation.  However he didn't have a history of COPD.  He was referred for cardiac  catheterization 05/2010, at which time he was noted to have normal right and left heart filling pressures.  He had 50-60 mid RCA stenosis.  Echo revealed LVEF 55% and was otherwise unremarkable.   Jeffery Fernandez had been seen in the emergency department 11/2016 for very elevated blood pressures but left before being seen.  At that appointment nebivolol was switched back to carvedilol and he was given hydralazine as needed.  At his last appointment amlodipine was added.  He has tolerated this dose without increase in swelling.  He had facial swelling at higher doses.  He has followed up with our pharmacist and terazosin was increased to 5 mg on 07/17/2017.  Since that time he is been tracking his blood pressure.  It is mostly been well-controlled.  The readings have been in the 120s to 130s with one reading of 195 systolic.  He continues to chase after his twin grandchildren who he takes care of during the day.  He has no chest pain or shortness of breath with exertion.  He has not noted any lower extremity edema, orthopnea, or PND.  He has noted throat drainage and cough at night.  He sometimes has cough in the daytime but it is not consistent.   Past Medical History:  Diagnosis Date  . Arrhythmia   . Arthritis   . Cataract    bilateral cataract extraction with intraoccular lens implants  . Chest pain, unspecified   . Chronic airway obstruction, not elsewhere classified  PT. DENIES HE HAS COPD  . CKD (chronic kidney disease)   . Colon polyps    Adenomatous Polyps 2007  . Depression   . Diabetes mellitus   . Diverticulosis   . Gout   . Heart murmur   . Hyperlipemia   . Hypertension   . Nephrolithiasis   . Normocytic anemia 04/23/2017  . Obstructive sleep apnea (adult) (pediatric)   . Other and unspecified hyperlipidemia   . Other left bundle branch block   . Retinal vascular occlusion, unspecified    right  . Shortness of breath    on exertion  . Unspecified sleep apnea   . Vision loss of  right eye   . White coat syndrome with diagnosis of hypertension 09/19/2017    Past Surgical History:  Procedure Laterality Date  . ANTERIOR CERVICAL DECOMP/DISCECTOMY FUSION N/A 06/26/2016   Procedure: Re-Exploration of Cervical Wound;  Surgeon: Kary Kos, MD;  Location: [place];  Service: Neurosurgery;  Laterality: N/A;  . ANTERIOR CERVICAL DECOMP/DISCECTOMY FUSION N/A 06/26/2016   Procedure: ANTERIOR CERVICAL DECOMPRESSION/DISCECTOMY FUSION, INTERBODY PROSTESIS, PLATE, CERVICAL THREE CERVICAL FOUR, CERVICAL FOUR CERVICAL FIVE CERVICAL SIX;  Surgeon: Newman Pies, MD;  Location: [facility];  Service: Neurosurgery;  Laterality: N/A;  . CATARACT EXTRACTION Bilateral   . KIDNEY STONE SURGERY       Current Outpatient Medications  Medication Sig Dispense Refill  . acetaminophen (TYLENOL) 500 MG tablet Take 1,000 mg by mouth every 6 (six) hours as needed (for pain.).    Marland Kitchen amLODipine (NORVASC) 2.5 MG tablet Take 1 tablet (2.5 mg total) by mouth daily. 30 tablet 5  . aspirin 325 MG EC tablet Take 325 mg by mouth daily.    Marland Kitchen atorvastatin (LIPITOR) 40 MG tablet Take 20 mg by mouth at bedtime.     . carvedilol (COREG) 25 MG tablet Take 1 tablet (25 mg total) by mouth 2 (two) times daily with a meal. 30 tablet 11  . glipiZIDE (GLUCOTROL XL) 10 MG 24 hr tablet Take 10 mg by mouth 2 (two) times daily.    . hydrALAZINE (APRESOLINE) 25 MG tablet 1 TABLET EVERY 6 HOURS AS NEEDED FOR SYSTOLIC BLOOD PRESSURE 659 OR GREATER 60 tablet 5  . hydrochlorothiazide (MICROZIDE) 12.5 MG capsule TAKE 1 CAPSULE BY MOUTH EVERY DAY 90 capsule 0  . Ketotifen Fumarate (ITCHY EYE DROPS OP) Place 1 drop into both eyes daily as needed (itching/allergies).     Marland Kitchen lisinopril (PRINIVIL,ZESTRIL) 40 MG tablet Take 1 tablet (40 mg total) by mouth daily. 90 tablet 3  . metFORMIN (GLUCOPHAGE) 500 MG tablet Take 1,000 mg by mouth 2 (two) times daily with a meal.     . pantoprazole (PROTONIX) 40 MG tablet Take 40 mg by mouth daily.    .  sertraline (ZOLOFT) 100 MG tablet Take 100 mg daily by mouth.     . sitaGLIPtan (JANUVIA) 100 MG tablet Take 100 mg by mouth daily.      Marland Kitchen terazosin (HYTRIN) 5 MG capsule Take 1 capsule (5 mg total) by mouth at bedtime. 30 capsule 5  . fluticasone (FLONASE) 50 MCG/ACT nasal spray Place 1 spray into both nostrils daily. 11.1 g 0   Current Facility-Administered Medications  Medication Dose Route Frequency Provider Last Rate Last Dose  . 0.9 %  sodium chloride infusion  500 mL Intravenous Once Doran Stabler, MD        Allergies:   Penicillins    Social History:  The patient  reports  that he quit smoking about 31 years ago. His smoking use included cigarettes. He has a 37.50 pack-year smoking history. He has never used smokeless tobacco. He reports that he does not drink alcohol or use drugs.   Family History:  The patient's family history includes Arthritis/Rheumatoid in his cousin; Atrial fibrillation in his brother; Diabetes in his other; Heart attack (age of onset: 83) in his brother; Heart disease in his father; Parkinson's disease in his brother; Stroke in his father; Uterine cancer in his mother.    ROS:  Please see the history of present illness.   Otherwise, review of systems are positive for none.   All other systems are reviewed and negative.    PHYSICAL EXAM: VS:  BP (!) 166/78   Pulse 61   Ht 5\' 10"  (1.778 m)   Wt 208 lb (94.3 kg)   BMI 29.84 kg/m  , BMI Body mass index is 29.84 kg/m. GENERAL:  Well appearing.  No acute distress HEENT: Pupils equal round and reactive, fundi not visualized, oral mucosa unremarkable NECK:  No jugular venous distention, waveform within normal limits, carotid upstroke brisk and symmetric, no bruits, no thyromegaly LYMPHATICS:  No cervical adenopathy LUNGS:  Clear to auscultation bilaterally HEART:  RRR.  PMI not displaced or sustained,S1 and S2 within normal limits, no S3, no S4, no clicks, no rubs, II/VI systolic murmur at the LUSB ABD:   Flat, positive bowel sounds normal in frequency in pitch, no bruits, no rebound, no guarding, no midline pulsatile mass, no hepatomegaly, no splenomegaly EXT:  2 plus pulses throughout, no edema, no cyanosis no clubbing SKIN:  No rashes no nodules NEURO:  Cranial nerves II through XII grossly intact, motor grossly intact throughout PSYCH:  Cognitively intact, oriented to person place and time   EKG:  EKG is ordered today. The ekg ordered 06/19/16 demonstrates sinus bradycardia.  LBBB.  LAD.   01/01/17:  Sinus rhythm.  Rate 64 bpm.  Left bundle branch block.  Left axis deviation. 09/19/2017: Sinus rhythm.  Rate 61 bpm.  Left bundle branch block.  Left axis deviation.   Recent Labs: 10/30/2016: BUN 23; Creatinine, Ser 1.45; Potassium 3.9; Sodium 141    Lipid Panel    Component Value Date/Time   CHOL 127 06/08/2010 1110   TRIG 122.0 06/08/2010 1110   HDL 26.80 (L) 06/08/2010 1110   CHOLHDL 5 06/08/2010 1110   VLDL 24.4 06/08/2010 1110   LDLCALC 76 06/08/2010 1110   03/26/16: Total cholesterol 129, triglycerides 140, HDL 33, LDL 68   Wt Readings from Last 3 Encounters:  09/19/17 208 lb (94.3 kg)  04/30/17 201 lb (91.2 kg)  04/23/17 201 lb (91.2 kg)      ASSESSMENT AND PLAN:  # CAD: Mr. Schellhase has non-obstructive CAD that is medically managed.  Continue aspirin, carvedilol, and atorvastatin.  He is on aspirin 325mg  per neurology for prior stroke.    # Hypertension: # White coat hypertension: Blood pressure remains elevated.  Add amlodipine.  Continue irbesartan, HCTZ, carvedilol, and prn hydralazine.  Goal is <130/80.  It is been mostly well-controlled at home and he has hypotension when we try to titrate his medications.  He has a cough that seems to be worse at night and he attributes it to drainage.  We will try Flonase but if this does not help the cough we will need to switch from lisinopril to an ARB.  # Hyperlipidemia: Continue atorvastatin.  LDL was 68 02/2016.  We will get  a copy of his most recent lipids.  Current medicines are reviewed at length with the patient today.  The patient does not have concerns regarding medicines.  The following changes have been made: Flonase  Labs/ tests ordered today include:   Orders Placed This Encounter  Procedures  . EKG 12-Lead     Disposition:   FU with Tavius Turgeon C. Oval Linsey, MD, [facility] in 6 months.     Signed, Amaya Blakeman C. Oval Linsey, MD, [facility]  09/19/2017 4:59 PM     [facility]

## 2017-09-29 ENCOUNTER — Other Ambulatory Visit: Payer: Self-pay | Admitting: Cardiovascular Disease

## 2017-11-24 ENCOUNTER — Other Ambulatory Visit: Payer: Self-pay | Admitting: Cardiovascular Disease

## 2017-12-01 ENCOUNTER — Other Ambulatory Visit: Payer: Self-pay | Admitting: Family Medicine

## 2017-12-01 ENCOUNTER — Ambulatory Visit
Admission: RE | Admit: 2017-12-01 | Discharge: 2017-12-01 | Disposition: A | Discharge status: Home / Self Care | Payer: Medicare Other | Source: Ambulatory Visit | Attending: Family Medicine | Admitting: Family Medicine

## 2017-12-01 DIAGNOSIS — M533 Sacrococcygeal disorders, not elsewhere classified: Secondary | ICD-10-CM

## 2017-12-02 ENCOUNTER — Ambulatory Visit: Payer: Medicare Other

## 2017-12-04 ENCOUNTER — Other Ambulatory Visit: Payer: Self-pay | Admitting: Family Medicine

## 2017-12-04 DIAGNOSIS — S32039A Unspecified fracture of third lumbar vertebra, initial encounter for closed fracture: Secondary | ICD-10-CM

## 2017-12-05 ENCOUNTER — Ambulatory Visit
Admission: RE | Admit: 2017-12-05 | Discharge: 2017-12-05 | Disposition: A | Discharge status: Home / Self Care | Payer: Medicare Other | Source: Ambulatory Visit | Attending: Family Medicine | Admitting: Family Medicine

## 2017-12-05 DIAGNOSIS — S32039A Unspecified fracture of third lumbar vertebra, initial encounter for closed fracture: Secondary | ICD-10-CM

## 2017-12-08 DIAGNOSIS — S32000A Wedge compression fracture of unspecified lumbar vertebra, initial encounter for closed fracture: Secondary | ICD-10-CM | POA: Insufficient documentation

## 2017-12-17 ENCOUNTER — Other Ambulatory Visit: Payer: Self-pay | Admitting: Cardiovascular Disease

## 2018-01-09 ENCOUNTER — Other Ambulatory Visit: Payer: Self-pay | Admitting: Neurosurgery

## 2018-01-13 ENCOUNTER — Other Ambulatory Visit: Payer: Self-pay | Admitting: Neurosurgery

## 2018-01-13 NOTE — Pre-Procedure Instructions (Signed)
Jeffery Fernandez  01/13/2018      [id number] [place], [place] - [address] [place] [address] Phone: [phone] Fax: [phone]    Your procedure is scheduled on December 23  Report to [facility] at 1245 P.M.  Call this number if you have problems the morning of surgery:  [phone]   Remember:  Do not eat or drink after midnight.      Take these medicines the morning of surgery with A SIP OF WATER  acetaminophen (TYLENOL)  amLODipine (NORVASC) carvedilol (COREG)  fluticasone (FLONASE) hydrALAZINE (APRESOLINE) pantoprazole (PROTONIX) sertraline (ZOLOFT) Eye drops if needed  7 days prior to surgery STOP taking any Aspirin(unless otherwise instructed by your surgeon), Aleve, Naproxen, Ibuprofen, Motrin, Advil, Goody's, BC's, all herbal medications, fish oil, and all vitamins  Follow your surgeon's instructions on when to stop Asprin.  If no instructions were given by your surgeon then you will need to call the office to get those instructions.     WHAT DO I DO ABOUT MY DIABETES MEDICATION?   Marland Kitchen Do not take oral diabetes medicines (pills) the morning of surgery. metFORMIN (GLUCOPHAGE),  glipiZIDE (GLUCOTROL XL),  sitaGLIPtan (JANUVIA)  No evening dose of glipiZIDE (GLUCOTROL XL)  The night before surgery   How to Manage Your Diabetes Before and After Surgery  Why is it important to control my blood sugar before and after surgery? . Improving blood sugar levels before and after surgery helps healing and can limit problems. . A way of improving blood sugar control is eating a healthy diet by: o  Eating less sugar and carbohydrates o  Increasing activity/exercise o  Talking with your doctor about reaching your blood sugar goals . High blood sugars (greater than 180 mg/dL) can raise your risk of infections and slow your recovery, so you will need to focus on controlling your  diabetes during the weeks before surgery. . Make sure that the doctor who takes care of your diabetes knows about your planned surgery including the date and location.  How do I manage my blood sugar before surgery? . Check your blood sugar at least 4 times a day, starting 2 days before surgery, to make sure that the level is not too high or low. o Check your blood sugar the morning of your surgery when you wake up and every 2 hours until you get to the Short Stay unit. . If your blood sugar is less than 70 mg/dL, you will need to treat for low blood sugar: o Do not take insulin. o Treat a low blood sugar (less than 70 mg/dL) with  cup of clear juice (cranberry or apple), 4 glucose tablets, OR glucose gel. o Recheck blood sugar in 15 minutes after treatment (to make sure it is greater than 70 mg/dL). If your blood sugar is not greater than 70 mg/dL on recheck, call [phone] for further instructions. . Report your blood sugar to the short stay nurse when you get to Short Stay.  . If you are admitted to the hospital after surgery: o Your blood sugar will be checked by the staff and you will probably be given insulin after surgery (instead of oral diabetes medicines) to make sure you have good blood sugar levels. o The goal for blood sugar control after surgery is 80-180 mg/dL.     Do not wear jewelry  Do not wear lotions, powders, or cologne,  or deodorant.   Men may shave face and neck.  Do not bring valuables to the hospital.  [facility] is not responsible for any belongings or valuables.  Contacts, dentures or bridgework may not be worn into surgery.  Leave your suitcase in the car.  After surgery it may be brought to your room.  For patients admitted to the hospital, discharge time will be determined by your treatment team.  Patients discharged the day of surgery will not be allowed to drive home.    Special instructions:   [place]- Preparing For Surgery  Before surgery,  you can play an important role. Because skin is not sterile, your skin needs to be as free of germs as possible. You can reduce the number of germs on your skin by washing with CHG (chlorahexidine gluconate) Soap before surgery.  CHG is an antiseptic cleaner which kills germs and bonds with the skin to continue killing germs even after washing.    Oral Hygiene is also important to reduce your risk of infection.  Remember - BRUSH YOUR TEETH THE MORNING OF SURGERY WITH YOUR REGULAR TOOTHPASTE  Please do not use if you have an allergy to CHG or antibacterial soaps. If your skin becomes reddened/irritated stop using the CHG.  Do not shave (including legs and underarms) for at least 48 hours prior to first CHG shower. It is OK to shave your face.  Please follow these instructions carefully.   1. Shower the NIGHT BEFORE SURGERY and the MORNING OF SURGERY with CHG.   2. If you chose to wash your hair, wash your hair first as usual with your normal shampoo.  3. After you shampoo, rinse your hair and body thoroughly to remove the shampoo.  4. Use CHG as you would any other liquid soap. You can apply CHG directly to the skin and wash gently with a scrungie or a clean washcloth.   5. Apply the CHG Soap to your body ONLY FROM THE NECK DOWN.  Do not use on open wounds or open sores. Avoid contact with your eyes, ears, mouth and genitals (private parts). Wash Face and genitals (private parts)  with your normal soap.  6. Wash thoroughly, paying special attention to the area where your surgery will be performed.  7. Thoroughly rinse your body with warm water from the neck down.  8. DO NOT shower/wash with your normal soap after using and rinsing off the CHG Soap.  9. Pat yourself dry with a CLEAN TOWEL.  10. Wear CLEAN PAJAMAS to bed the night before surgery, wear comfortable clothes the morning of surgery  11. Place CLEAN SHEETS on your bed the night of your first shower and DO NOT SLEEP WITH  PETS.    Day of Surgery:  Do not apply any deodorants/lotions.  Please wear clean clothes to the hospital/surgery center.   Remember to brush your teeth WITH YOUR REGULAR TOOTHPASTE.    Please read over the following fact sheets that you were given.

## 2018-01-14 ENCOUNTER — Encounter (HOSPITAL_COMMUNITY): Payer: Self-pay

## 2018-01-14 ENCOUNTER — Other Ambulatory Visit: Payer: Self-pay

## 2018-01-14 ENCOUNTER — Encounter (HOSPITAL_COMMUNITY)
Admission: RE | Admit: 2018-01-14 | Discharge: 2018-01-14 | Disposition: A | Discharge status: Home / Self Care | Payer: Medicare Other | Source: Ambulatory Visit | Attending: Neurosurgery | Admitting: Neurosurgery

## 2018-01-14 DIAGNOSIS — Z01812 Encounter for preprocedural laboratory examination: Secondary | ICD-10-CM | POA: Insufficient documentation

## 2018-01-14 HISTORY — DX: Personal history of urinary calculi: Z87.442

## 2018-01-14 LAB — BASIC METABOLIC PANEL
Anion gap: 14 (ref 5–15)
BUN: 17 mg/dL (ref 8–23)
CALCIUM: 9.2 mg/dL (ref 8.9–10.3)
CO2: 23 mmol/L (ref 22–32)
CREATININE: 1.64 mg/dL — AB (ref 0.61–1.24)
Chloride: 102 mmol/L (ref 98–111)
GFR calc Af Amer: 47 mL/min — ABNORMAL LOW (ref >60)
GFR, EST NON AFRICAN AMERICAN: 41 mL/min — AB (ref >60)
Glucose, Bld: 212 mg/dL — ABNORMAL HIGH (ref 70–99)
POTASSIUM: 3.6 mmol/L (ref 3.5–5.1)
SODIUM: 139 mmol/L (ref 135–145)

## 2018-01-14 LAB — CBC
HCT: 36.2 % — ABNORMAL LOW (ref 39.0–52.0)
Hemoglobin: 11.7 g/dL — ABNORMAL LOW (ref 13.0–17.0)
MCH: 29.8 pg (ref 26.0–34.0)
MCHC: 32.3 g/dL (ref 30.0–36.0)
MCV: 92.1 fL (ref 80.0–100.0)
PLATELETS: 154 10*3/uL (ref 150–400)
RBC: 3.93 MIL/uL — AB (ref 4.22–5.81)
RDW: 13.2 % (ref 11.5–15.5)
WBC: 8.6 10*3/uL (ref 4.0–10.5)
nRBC: 0 % (ref 0.0–0.2)

## 2018-01-14 LAB — SURGICAL PCR SCREEN
MRSA, PCR: NEGATIVE
STAPHYLOCOCCUS AUREUS: NEGATIVE

## 2018-01-14 LAB — HEMOGLOBIN A1C
HEMOGLOBIN A1C: 6.2 % — AB (ref 4.8–5.6)
Mean Plasma Glucose: 131.24 mg/dL

## 2018-01-14 LAB — GLUCOSE, CAPILLARY: GLUCOSE-CAPILLARY: 166 mg/dL — AB (ref 70–99)

## 2018-01-14 NOTE — Progress Notes (Addendum)
PCP - Dr. Dione Housekeeper Cardiologist - Dr. Skeet Latch   Chest x-ray - N/A EKG - 09/19/17 Stress Test - 2011 ECHO - 2012 Cardiac Cath - denies  Sleep Study - 2012 CPAP - uses "most nights". Instructed to bring CPAP mask to hospital.   Fasting Blood Sugar - 80-90 Checks Blood Sugar twice a week.  Aspirin Instructions: patient instructed to follow Dr. Arnoldo Morale guidelines on when to stop ASA, and hold  NSAID's/herbal medications/fish oil and vitamins 7 days prior to surgery.   Anesthesia review: yes-abnormal EKG  Patient denies shortness of breath, fever, cough and chest pain at PAT appointment   Patient verbalized understanding of instructions that were given to them at the PAT appointment. Patient was also instructed that they will need to review over the PAT instructions again at home before surgery.

## 2018-01-15 NOTE — Progress Notes (Signed)
Anesthesia Chart Review:  Case:  [id number] Date/Time:  01/19/18 1430   Procedure:  KYPHOPLASTY LUMBAR 3 (N/A ) - KYPHOPLASTY LUMBAR 3   Anesthesia type:  General   Pre-op diagnosis:  COMPRESSION FRACTURE OF LUMBAR 3 VERTEBRA WITH DELAYED HEALING   Location:  [facility] OR ROOM 20 / [facility]   Surgeon:  Newman Pies, MD      DISCUSSION: 72 yo male former smoker. Pertinent hx includes HTN, LBBB, OSA on CPAP, DMII, CKD, Anemia, CVA, Vision loss in right eye due to retinal vascular occlusion.  Follows with cardiology, Dr. Oval Linsey, for medically managed CAD, chronic LBBB. In 2012 he had workup for SOB. Cardiac catheterization 05/2010 showed normal right and left heart filling pressures.  He had 50-60% mid RCA stenosis.  Echo revealed LVEF 55% and was otherwise unremarkable.  Per Dr. Blenda Mounts last [facility] note 09/19/2017, "He continues to chase after his twin grandchildren who he takes care of during the day.  He has no chest pain or shortness of breath with exertion.  He has not noted any lower extremity edema, orthopnea, or PND...Mr. Bugarin has non-obstructive CAD that is medically managed. Continue aspirin, carvedilol, and atorvastatin.  He is on aspirin 325mg  per neurology for prior stroke."  Per Lexine Baton at Dr. Arnoldo Morale' office, pt instructed to stop ASA 325mg  5d preop.  He also has a history of poorly controlled HTN. BP 163/68 at PAT. Anticipate he can proceed as planned barring acute status change and BP acceptable on DOS.   VS: BP (!) 163/68   Pulse 64   Temp (!) 36.4 C   Ht 5\' 9"  (1.753 m)   Wt 95.1 kg   SpO2 100%   BMI 30.97 kg/m   PROVIDERS: Dione Housekeeper, MD is PCP  Skeet Latch, MD is Cardiologist last seen 09/19/17.  LABS: Labs reviewed: Acceptable for surgery. Elevated creatinine c/w pt history of CKD. (all labs ordered are listed, but only abnormal results are displayed)  Labs Reviewed  GLUCOSE, CAPILLARY - Abnormal; Notable for the following components:      Result Value   Glucose-Capillary 166 (*)    All other components within normal limits  CBC - Abnormal; Notable for the following components:   RBC 3.93 (*)    Hemoglobin 11.7 (*)    HCT 36.2 (*)    All other components within normal limits  BASIC METABOLIC PANEL - Abnormal; Notable for the following components:   Glucose, Bld 212 (*)    Creatinine, Ser 1.64 (*)    GFR calc non Af Amer 41 (*)    GFR calc Af Amer 47 (*)    All other components within normal limits  HEMOGLOBIN A1C - Abnormal; Notable for the following components:   Hgb A1c MFr Bld 6.2 (*)    All other components within normal limits  SURGICAL PCR SCREEN     IMAGES: N/A   EKG: 09/19/2017: NSR. Rate 61. LAD. LBBB.  CV: TTE 06/20/2010: Study Conclusions  - Left ventricle: The cavity size was normal. Wall thickness was increased in a pattern of mild LVH. Systolic function was normal. The estimated ejection fraction was 55%. Septal bounce consistent with LBBB, otherwise normal wall motion. Doppler parameters are consistent with abnormal left ventricular relaxation (grade 1 diastolic dysfunction). - Aortic valve: There was no stenosis. Trivial regurgitation. - Mitral valve: No significant regurgitation. - Left atrium: The atrium was mildly dilated. - Right ventricle: The cavity size was normal. Systolic function was normal. - Pulmonary arteries: No complete  TR doppler jet so unable to estimate PA systolic pressure. - Inferior vena cava: The vessel was normal in size; the respirophasic diameter changes were in the normal range (= 50%); findings are consistent with normal central venous pressure. Impressions:  - Normal LV size with mild LV hypertrophy. EF 55% with septal bounce consistent with LBBB. Normal RV size and systolic function. No significant valvular abnormalities.  Past Medical History:  Diagnosis Date  . Arrhythmia   . Arthritis   . Cataract    bilateral cataract extraction with  intraoccular lens implants  . Chest pain, unspecified   . Chronic airway obstruction, not elsewhere classified    PT. DENIES HE HAS COPD  . CKD (chronic kidney disease)   . Colon polyps    Adenomatous Polyps 2007  . Depression   . Diabetes mellitus   . Diverticulosis   . Gout   . Heart murmur   . History of kidney stones   . Hyperlipemia   . Hypertension   . Nephrolithiasis   . Normocytic anemia 04/23/2017  . Obstructive sleep apnea (adult) (pediatric)   . Other and unspecified hyperlipidemia   . Other left bundle branch block   . Retinal vascular occlusion, unspecified    right  . Shortness of breath    on exertion  . Unspecified sleep apnea   . Vision loss of right eye   . White coat syndrome with diagnosis of hypertension 09/19/2017    Past Surgical History:  Procedure Laterality Date  . ANTERIOR CERVICAL DECOMP/DISCECTOMY FUSION N/A 06/26/2016   Procedure: Re-Exploration of Cervical Wound;  Surgeon: Kary Kos, MD;  Location: [facility];  Service: Neurosurgery;  Laterality: N/A;  . ANTERIOR CERVICAL DECOMP/DISCECTOMY FUSION N/A 06/26/2016   Procedure: ANTERIOR CERVICAL DECOMPRESSION/DISCECTOMY FUSION, INTERBODY PROSTESIS, PLATE, CERVICAL THREE CERVICAL FOUR, CERVICAL FOUR CERVICAL FIVE CERVICAL SIX;  Surgeon: Newman Pies, MD;  Location: [facility];  Service: Neurosurgery;  Laterality: N/A;  . CATARACT EXTRACTION Bilateral   . KIDNEY STONE SURGERY      MEDICATIONS: . acetaminophen (TYLENOL) 500 MG tablet  . amLODipine (NORVASC) 2.5 MG tablet  . aspirin 325 MG EC tablet  . atorvastatin (LIPITOR) 40 MG tablet  . carvedilol (COREG) 25 MG tablet  . fluticasone (FLONASE) 50 MCG/ACT nasal spray  . glipiZIDE (GLUCOTROL XL) 10 MG 24 hr tablet  . hydrochlorothiazide (MICROZIDE) 12.5 MG capsule  . lisinopril (PRINIVIL,ZESTRIL) 40 MG tablet  . metFORMIN (GLUCOPHAGE) 500 MG tablet  . pantoprazole (PROTONIX) 40 MG tablet  . sertraline (ZOLOFT) 100 MG tablet  . sitaGLIPtan (JANUVIA)  100 MG tablet  . terazosin (HYTRIN) 5 MG capsule   . 0.9 %  sodium chloride infusion     Wynonia Musty [facility] Short Stay Center/Anesthesiology Phone [phone] 01/15/2018 10:57 AM

## 2018-01-15 NOTE — Anesthesia Preprocedure Evaluation (Addendum)
Anesthesia Evaluation  Patient identified by MRN, date of birth, ID band Patient awake    Reviewed: Allergy & Precautions, NPO status , Patient's Chart, lab work & pertinent test results  Airway Mallampati: III  TM Distance: >3 FB Neck ROM: Full    Dental  (+) Edentulous Upper, Dental Advisory Given, Partial Lower   Pulmonary former smoker,    breath sounds clear to auscultation       Cardiovascular hypertension,  Rhythm:Regular Rate:Normal     Neuro/Psych    GI/Hepatic   Endo/Other  diabetes  Renal/GU      Musculoskeletal   Abdominal   Peds  Hematology   Anesthesia Other Findings   Reproductive/Obstetrics                            Anesthesia Physical Anesthesia Plan  ASA: III  Anesthesia Plan: General   Post-op Pain Management:    Induction: Intravenous  PONV Risk Score and Plan: Ondansetron  Airway Management Planned: Oral ETT  Additional Equipment:   Intra-op Plan:   Post-operative Plan: Extubation in OR  Informed Consent: I have reviewed the patients History and Physical, chart, labs and discussed the procedure including the risks, benefits and alternatives for the proposed anesthesia with the patient or authorized representative who has indicated his/her understanding and acceptance.   Dental advisory given  Plan Discussed with: CRNA and Anesthesiologist  Anesthesia Plan Comments: (See PAT note 01/14/2018 by Karoline Caldwell, PA-C )       Anesthesia Quick Evaluation

## 2018-01-19 ENCOUNTER — Ambulatory Visit (HOSPITAL_COMMUNITY): Payer: Medicare Other | Admitting: Certified Registered"

## 2018-01-19 ENCOUNTER — Ambulatory Visit (HOSPITAL_COMMUNITY)
Admission: RE | Admit: 2018-01-19 | Discharge: 2018-01-20 | Disposition: A | Discharge status: Home / Self Care | Payer: Medicare Other | Attending: Neurosurgery | Admitting: Neurosurgery

## 2018-01-19 ENCOUNTER — Encounter (HOSPITAL_COMMUNITY): Payer: Self-pay | Admitting: *Deleted

## 2018-01-19 ENCOUNTER — Encounter (HOSPITAL_COMMUNITY)
Admission: RE | Disposition: A | Discharge status: Home / Self Care | Payer: Self-pay | Source: Home / Self Care | Attending: Neurosurgery

## 2018-01-19 ENCOUNTER — Ambulatory Visit (HOSPITAL_COMMUNITY): Payer: Medicare Other

## 2018-01-19 ENCOUNTER — Other Ambulatory Visit: Payer: Self-pay

## 2018-01-19 ENCOUNTER — Ambulatory Visit (HOSPITAL_COMMUNITY): Payer: Medicare Other | Admitting: Physician Assistant

## 2018-01-19 DIAGNOSIS — E1122 Type 2 diabetes mellitus with diabetic chronic kidney disease: Secondary | ICD-10-CM | POA: Insufficient documentation

## 2018-01-19 DIAGNOSIS — I129 Hypertensive chronic kidney disease with stage 1 through stage 4 chronic kidney disease, or unspecified chronic kidney disease: Secondary | ICD-10-CM | POA: Insufficient documentation

## 2018-01-19 DIAGNOSIS — G4733 Obstructive sleep apnea (adult) (pediatric): Secondary | ICD-10-CM | POA: Diagnosis not present

## 2018-01-19 DIAGNOSIS — J449 Chronic obstructive pulmonary disease, unspecified: Secondary | ICD-10-CM | POA: Insufficient documentation

## 2018-01-19 DIAGNOSIS — Z7982 Long term (current) use of aspirin: Secondary | ICD-10-CM | POA: Insufficient documentation

## 2018-01-19 DIAGNOSIS — Z7951 Long term (current) use of inhaled steroids: Secondary | ICD-10-CM | POA: Insufficient documentation

## 2018-01-19 DIAGNOSIS — Z8249 Family history of ischemic heart disease and other diseases of the circulatory system: Secondary | ICD-10-CM | POA: Insufficient documentation

## 2018-01-19 DIAGNOSIS — F329 Major depressive disorder, single episode, unspecified: Secondary | ICD-10-CM | POA: Insufficient documentation

## 2018-01-19 DIAGNOSIS — Z7984 Long term (current) use of oral hypoglycemic drugs: Secondary | ICD-10-CM | POA: Diagnosis not present

## 2018-01-19 DIAGNOSIS — N189 Chronic kidney disease, unspecified: Secondary | ICD-10-CM | POA: Insufficient documentation

## 2018-01-19 DIAGNOSIS — Z87891 Personal history of nicotine dependence: Secondary | ICD-10-CM | POA: Diagnosis not present

## 2018-01-19 DIAGNOSIS — M79606 Pain in leg, unspecified: Secondary | ICD-10-CM | POA: Diagnosis not present

## 2018-01-19 DIAGNOSIS — Z419 Encounter for procedure for purposes other than remedying health state, unspecified: Secondary | ICD-10-CM

## 2018-01-19 DIAGNOSIS — Z880 Allergy status to penicillin: Secondary | ICD-10-CM | POA: Insufficient documentation

## 2018-01-19 DIAGNOSIS — S32039A Unspecified fracture of third lumbar vertebra, initial encounter for closed fracture: Secondary | ICD-10-CM | POA: Diagnosis present

## 2018-01-19 DIAGNOSIS — Z79899 Other long term (current) drug therapy: Secondary | ICD-10-CM | POA: Diagnosis not present

## 2018-01-19 DIAGNOSIS — M1990 Unspecified osteoarthritis, unspecified site: Secondary | ICD-10-CM | POA: Insufficient documentation

## 2018-01-19 DIAGNOSIS — E785 Hyperlipidemia, unspecified: Secondary | ICD-10-CM | POA: Insufficient documentation

## 2018-01-19 DIAGNOSIS — W19XXXA Unspecified fall, initial encounter: Secondary | ICD-10-CM | POA: Diagnosis not present

## 2018-01-19 DIAGNOSIS — S32000A Wedge compression fracture of unspecified lumbar vertebra, initial encounter for closed fracture: Secondary | ICD-10-CM | POA: Diagnosis present

## 2018-01-19 HISTORY — PX: KYPHOPLASTY: SHX5884

## 2018-01-19 LAB — GLUCOSE, CAPILLARY
Glucose-Capillary: 159 mg/dL — ABNORMAL HIGH (ref 70–99)
Glucose-Capillary: 166 mg/dL — ABNORMAL HIGH (ref 70–99)
Glucose-Capillary: 131 mg/dL — ABNORMAL HIGH (ref 70–99)
Glucose-Capillary: 263 mg/dL — ABNORMAL HIGH (ref 70–99)

## 2018-01-19 LAB — HEMOGLOBIN A1C
Hgb A1c MFr Bld: 6.1 % — ABNORMAL HIGH (ref 4.8–5.6)
Mean Plasma Glucose: 128.37 mg/dL

## 2018-01-19 SURGERY — KYPHOPLASTY
Anesthesia: General

## 2018-01-19 MED ORDER — PHENOL 1.4 % MT LIQD: 1.0000 | OROMUCOSAL | Status: DC | PRN

## 2018-01-19 MED ORDER — VANCOMYCIN HCL IN DEXTROSE 1-5 GM/200ML-% IV SOLN
INTRAVENOUS | Status: AC
  Administered 2018-01-19: 1000 mg via INTRAVENOUS
  Filled 2018-01-19: qty 200

## 2018-01-19 MED ORDER — 0.9 % SODIUM CHLORIDE (POUR BTL) OPTIME
TOPICAL | Status: DC | PRN
  Administered 2018-01-19: 1000 mL

## 2018-01-19 MED ORDER — VANCOMYCIN HCL IN DEXTROSE 750-5 MG/150ML-% IV SOLN
750.0000 mg | Freq: Once | INTRAVENOUS | Status: AC
  Administered 2018-01-20: 750 mg via INTRAVENOUS
  Filled 2018-01-19: qty 150

## 2018-01-19 MED ORDER — EPHEDRINE SULFATE-NACL 50-0.9 MG/10ML-% IV SOSY
PREFILLED_SYRINGE | INTRAVENOUS | Status: DC | PRN
  Administered 2018-01-19: 10 mg via INTRAVENOUS

## 2018-01-19 MED ORDER — FLUTICASONE PROPIONATE 50 MCG/ACT NA SUSP
1.0000 | Freq: Every day | NASAL | Status: DC | PRN
  Filled 2018-01-19: qty 16

## 2018-01-19 MED ORDER — GLYCOPYRROLATE PF 0.2 MG/ML IJ SOSY
PREFILLED_SYRINGE | INTRAMUSCULAR | Status: DC | PRN
  Administered 2018-01-19: .2 mg via INTRAVENOUS

## 2018-01-19 MED ORDER — GLYCOPYRROLATE PF 0.2 MG/ML IJ SOSY
PREFILLED_SYRINGE | INTRAMUSCULAR | Status: AC
  Filled 2018-01-19: qty 1

## 2018-01-19 MED ORDER — ATORVASTATIN CALCIUM 10 MG PO TABS
20.0000 mg | ORAL_TABLET | Freq: Every day | ORAL | Status: DC
  Administered 2018-01-19: 20 mg via ORAL
  Filled 2018-01-19: qty 2

## 2018-01-19 MED ORDER — ONDANSETRON HCL 4 MG PO TABS: 4.0000 mg | ORAL_TABLET | Freq: Four times a day (QID) | ORAL | Status: DC | PRN

## 2018-01-19 MED ORDER — IOPAMIDOL (ISOVUE-300) INJECTION 61%
INTRAVENOUS | Status: AC
  Filled 2018-01-19: qty 50

## 2018-01-19 MED ORDER — PANTOPRAZOLE SODIUM 40 MG PO TBEC
40.0000 mg | DELAYED_RELEASE_TABLET | Freq: Every day | ORAL | Status: DC
  Administered 2018-01-20: 40 mg via ORAL
  Filled 2018-01-19: qty 1

## 2018-01-19 MED ORDER — FENTANYL CITRATE (PF) 100 MCG/2ML IJ SOLN: 25.0000 ug | INTRAMUSCULAR | Status: DC | PRN

## 2018-01-19 MED ORDER — LINAGLIPTIN 5 MG PO TABS
5.0000 mg | ORAL_TABLET | Freq: Every day | ORAL | Status: DC
  Administered 2018-01-19 – 2018-01-20 (×2): 5 mg via ORAL
  Filled 2018-01-19 (×2): qty 1

## 2018-01-19 MED ORDER — METFORMIN HCL 500 MG PO TABS
1000.0000 mg | ORAL_TABLET | Freq: Two times a day (BID) | ORAL | Status: DC
  Administered 2018-01-20: 1000 mg via ORAL
  Filled 2018-01-19: qty 2

## 2018-01-19 MED ORDER — OXYCODONE HCL 5 MG PO TABS
5.0000 mg | ORAL_TABLET | ORAL | Status: DC | PRN
  Administered 2018-01-20: 5 mg via ORAL
  Filled 2018-01-19: qty 1

## 2018-01-19 MED ORDER — CHLORHEXIDINE GLUCONATE CLOTH 2 % EX PADS: 6.0000 | MEDICATED_PAD | Freq: Once | CUTANEOUS | Status: DC

## 2018-01-19 MED ORDER — SODIUM CHLORIDE 0.9% FLUSH
3.0000 mL | Freq: Two times a day (BID) | INTRAVENOUS | Status: DC
  Administered 2018-01-19: 3 mL via INTRAVENOUS

## 2018-01-19 MED ORDER — DEXAMETHASONE SODIUM PHOSPHATE 10 MG/ML IJ SOLN
INTRAMUSCULAR | Status: AC
  Filled 2018-01-19: qty 1

## 2018-01-19 MED ORDER — LISINOPRIL 20 MG PO TABS
40.0000 mg | ORAL_TABLET | Freq: Every day | ORAL | Status: DC
  Administered 2018-01-20: 40 mg via ORAL
  Filled 2018-01-19: qty 2

## 2018-01-19 MED ORDER — INSULIN ASPART 100 UNIT/ML ~~LOC~~ SOLN
0.0000 [IU] | Freq: Every day | SUBCUTANEOUS | Status: DC
  Administered 2018-01-19: 3 [IU] via SUBCUTANEOUS

## 2018-01-19 MED ORDER — CYCLOBENZAPRINE HCL 10 MG PO TABS: 10.0000 mg | ORAL_TABLET | Freq: Three times a day (TID) | ORAL | Status: DC | PRN

## 2018-01-19 MED ORDER — ACETAMINOPHEN 500 MG PO TABS: 1000.0000 mg | ORAL_TABLET | Freq: Four times a day (QID) | ORAL | Status: DC | PRN

## 2018-01-19 MED ORDER — OXYCODONE HCL 5 MG PO TABS: 5.0000 mg | ORAL_TABLET | Freq: Once | ORAL | Status: DC | PRN

## 2018-01-19 MED ORDER — BACITRACIN ZINC 500 UNIT/GM EX OINT
TOPICAL_OINTMENT | CUTANEOUS | Status: AC
  Filled 2018-01-19: qty 28.35

## 2018-01-19 MED ORDER — ONDANSETRON HCL 4 MG/2ML IJ SOLN
INTRAMUSCULAR | Status: DC | PRN
  Administered 2018-01-19: 4 mg via INTRAVENOUS

## 2018-01-19 MED ORDER — LACTATED RINGERS IV SOLN
INTRAVENOUS | Status: DC
  Administered 2018-01-19 (×2): via INTRAVENOUS

## 2018-01-19 MED ORDER — INSULIN ASPART 100 UNIT/ML ~~LOC~~ SOLN
0.0000 [IU] | Freq: Three times a day (TID) | SUBCUTANEOUS | Status: DC
  Administered 2018-01-20: 20 [IU] via SUBCUTANEOUS

## 2018-01-19 MED ORDER — SUGAMMADEX SODIUM 200 MG/2ML IV SOLN
INTRAVENOUS | Status: DC | PRN
  Administered 2018-01-19: 200 mg via INTRAVENOUS

## 2018-01-19 MED ORDER — OXYCODONE HCL 5 MG PO TABS: 10.0000 mg | ORAL_TABLET | ORAL | Status: DC | PRN

## 2018-01-19 MED ORDER — ONDANSETRON HCL 4 MG/2ML IJ SOLN
INTRAMUSCULAR | Status: AC
  Filled 2018-01-19: qty 2

## 2018-01-19 MED ORDER — PROPOFOL 10 MG/ML IV BOLUS
INTRAVENOUS | Status: AC
  Filled 2018-01-19: qty 20

## 2018-01-19 MED ORDER — SODIUM CHLORIDE 0.9% FLUSH: 3.0000 mL | INTRAVENOUS | Status: DC | PRN

## 2018-01-19 MED ORDER — ROCURONIUM BROMIDE 10 MG/ML (PF) SYRINGE
PREFILLED_SYRINGE | INTRAVENOUS | Status: DC | PRN
  Administered 2018-01-19: 50 mg via INTRAVENOUS

## 2018-01-19 MED ORDER — FENTANYL CITRATE (PF) 250 MCG/5ML IJ SOLN
INTRAMUSCULAR | Status: AC
  Filled 2018-01-19: qty 5

## 2018-01-19 MED ORDER — FENTANYL CITRATE (PF) 250 MCG/5ML IJ SOLN
INTRAMUSCULAR | Status: DC | PRN
  Administered 2018-01-19: 50 ug via INTRAVENOUS
  Administered 2018-01-19: 100 ug via INTRAVENOUS

## 2018-01-19 MED ORDER — ROCURONIUM BROMIDE 50 MG/5ML IV SOSY
PREFILLED_SYRINGE | INTRAVENOUS | Status: AC
  Filled 2018-01-19: qty 5

## 2018-01-19 MED ORDER — LIDOCAINE 2% (20 MG/ML) 5 ML SYRINGE
INTRAMUSCULAR | Status: AC
  Filled 2018-01-19: qty 5

## 2018-01-19 MED ORDER — ONDANSETRON HCL 4 MG/2ML IJ SOLN: 4.0000 mg | Freq: Four times a day (QID) | INTRAMUSCULAR | Status: DC | PRN

## 2018-01-19 MED ORDER — AMLODIPINE BESYLATE 5 MG PO TABS
2.5000 mg | ORAL_TABLET | Freq: Every day | ORAL | Status: DC
  Administered 2018-01-20: 2.5 mg via ORAL
  Filled 2018-01-19: qty 1

## 2018-01-19 MED ORDER — MIDAZOLAM HCL 2 MG/2ML IJ SOLN
INTRAMUSCULAR | Status: AC
  Filled 2018-01-19: qty 2

## 2018-01-19 MED ORDER — SERTRALINE HCL 50 MG PO TABS
100.0000 mg | ORAL_TABLET | Freq: Every day | ORAL | Status: DC
  Administered 2018-01-20: 100 mg via ORAL
  Filled 2018-01-19: qty 2

## 2018-01-19 MED ORDER — BUPIVACAINE-EPINEPHRINE (PF) 0.25% -1:200000 IJ SOLN
INTRAMUSCULAR | Status: AC
  Filled 2018-01-19: qty 30

## 2018-01-19 MED ORDER — GLIPIZIDE ER 10 MG PO TB24
10.0000 mg | ORAL_TABLET | Freq: Two times a day (BID) | ORAL | Status: DC
  Administered 2018-01-19 – 2018-01-20 (×2): 10 mg via ORAL
  Filled 2018-01-19 (×3): qty 1

## 2018-01-19 MED ORDER — DEXAMETHASONE SODIUM PHOSPHATE 10 MG/ML IJ SOLN
INTRAMUSCULAR | Status: DC | PRN
  Administered 2018-01-19: 10 mg via INTRAVENOUS

## 2018-01-19 MED ORDER — VANCOMYCIN HCL IN DEXTROSE 1-5 GM/200ML-% IV SOLN
1000.0000 mg | INTRAVENOUS | Status: AC
  Administered 2018-01-19: 1000 mg via INTRAVENOUS

## 2018-01-19 MED ORDER — ONDANSETRON HCL 4 MG/2ML IJ SOLN: 4.0000 mg | Freq: Once | INTRAMUSCULAR | Status: DC | PRN

## 2018-01-19 MED ORDER — BISACODYL 10 MG RE SUPP: 10.0000 mg | Freq: Every day | RECTAL | Status: DC | PRN

## 2018-01-19 MED ORDER — BUPIVACAINE-EPINEPHRINE 0.5% -1:200000 IJ SOLN
INTRAMUSCULAR | Status: DC | PRN
  Administered 2018-01-19: 10 mL

## 2018-01-19 MED ORDER — ACETAMINOPHEN 500 MG PO TABS
1000.0000 mg | ORAL_TABLET | Freq: Four times a day (QID) | ORAL | Status: DC
  Administered 2018-01-19 – 2018-01-20 (×3): 1000 mg via ORAL
  Filled 2018-01-19 (×3): qty 2

## 2018-01-19 MED ORDER — OXYCODONE HCL 5 MG/5ML PO SOLN: 5.0000 mg | Freq: Once | ORAL | Status: DC | PRN

## 2018-01-19 MED ORDER — CARVEDILOL 25 MG PO TABS
25.0000 mg | ORAL_TABLET | Freq: Two times a day (BID) | ORAL | Status: DC
  Administered 2018-01-19 – 2018-01-20 (×2): 25 mg via ORAL
  Filled 2018-01-19 (×2): qty 1

## 2018-01-19 MED ORDER — BACITRACIN ZINC 500 UNIT/GM EX OINT
TOPICAL_OINTMENT | CUTANEOUS | Status: DC | PRN
  Administered 2018-01-19: 1 via TOPICAL

## 2018-01-19 MED ORDER — MENTHOL 3 MG MT LOZG: 1.0000 | LOZENGE | OROMUCOSAL | Status: DC | PRN

## 2018-01-19 MED ORDER — MORPHINE SULFATE (PF) 4 MG/ML IV SOLN: 4.0000 mg | INTRAVENOUS | Status: DC | PRN

## 2018-01-19 MED ORDER — PROPOFOL 10 MG/ML IV BOLUS
INTRAVENOUS | Status: DC | PRN
  Administered 2018-01-19: 150 mg via INTRAVENOUS
  Administered 2018-01-19: 30 mg via INTRAVENOUS

## 2018-01-19 MED ORDER — HYDROCHLOROTHIAZIDE 12.5 MG PO CAPS
12.5000 mg | ORAL_CAPSULE | Freq: Every day | ORAL | Status: DC
  Administered 2018-01-20: 12.5 mg via ORAL
  Filled 2018-01-19: qty 1

## 2018-01-19 MED ORDER — TERAZOSIN HCL 5 MG PO CAPS
5.0000 mg | ORAL_CAPSULE | Freq: Every day | ORAL | Status: DC
  Administered 2018-01-19: 5 mg via ORAL
  Filled 2018-01-19 (×2): qty 1

## 2018-01-19 MED ORDER — IOPAMIDOL (ISOVUE-300) INJECTION 61%
INTRAVENOUS | Status: DC | PRN
  Administered 2018-01-19: 50 mL

## 2018-01-19 MED ORDER — EPHEDRINE 5 MG/ML INJ
INTRAVENOUS | Status: AC
  Filled 2018-01-19: qty 10

## 2018-01-19 MED ORDER — LIDOCAINE 2% (20 MG/ML) 5 ML SYRINGE
INTRAMUSCULAR | Status: DC | PRN
  Administered 2018-01-19: 60 mg via INTRAVENOUS

## 2018-01-19 MED ORDER — DOCUSATE SODIUM 100 MG PO CAPS
100.0000 mg | ORAL_CAPSULE | Freq: Two times a day (BID) | ORAL | Status: DC
  Administered 2018-01-19 – 2018-01-20 (×2): 100 mg via ORAL
  Filled 2018-01-19 (×2): qty 1

## 2018-01-19 MED ORDER — SODIUM CHLORIDE 0.9 % IV SOLN: 250.0000 mL | INTRAVENOUS | Status: DC

## 2018-01-19 MED ORDER — INSULIN ASPART 100 UNIT/ML ~~LOC~~ SOLN: 0.0000 [IU] | SUBCUTANEOUS | Status: DC

## 2018-01-19 SURGICAL SUPPLY — 39 items
BENZOIN TINCTURE PRP APPL 2/3 (GAUZE/BANDAGES/DRESSINGS) ×3 IMPLANT
BLADE CLIPPER SURG (BLADE) IMPLANT
BLADE SURG 15 STRL LF DISP TIS (BLADE) ×1 IMPLANT
BLADE SURG 15 STRL SS (BLADE) ×2
CARTRIDGE OIL MAESTRO DRILL (MISCELLANEOUS) IMPLANT
CEMENT KYPHON C01A KIT/MIXER (Cement) ×3 IMPLANT
CLOSURE WOUND 1/2 X4 (GAUZE/BANDAGES/DRESSINGS) ×1
COVER WAND RF STERILE (DRAPES) IMPLANT
DIFFUSER DRILL AIR PNEUMATIC (MISCELLANEOUS) IMPLANT
DRAPE C-ARM 42X72 X-RAY (DRAPES) ×3 IMPLANT
DRAPE HALF SHEET 40X57 (DRAPES) ×3 IMPLANT
DRAPE INCISE IOBAN 66X45 STRL (DRAPES) ×3 IMPLANT
DRAPE LAPAROTOMY 100X72X124 (DRAPES) ×3 IMPLANT
DRAPE SURG 17X23 STRL (DRAPES) ×12 IMPLANT
DRAPE WARM FLUID 44X44 (DRAPE) ×3 IMPLANT
DRSG OPSITE POSTOP 4X6 (GAUZE/BANDAGES/DRESSINGS) ×3 IMPLANT
GAUZE 4X4 16PLY RFD (DISPOSABLE) ×3 IMPLANT
GAUZE SPONGE 4X4 12PLY STRL (GAUZE/BANDAGES/DRESSINGS) ×3 IMPLANT
GLOVE BIO SURGEON STRL SZ8 (GLOVE) ×3 IMPLANT
GLOVE BIO SURGEON STRL SZ8.5 (GLOVE) ×3 IMPLANT
GLOVE EXAM NITRILE XL STR (GLOVE) IMPLANT
GOWN STRL REUS W/ TWL LRG LVL3 (GOWN DISPOSABLE) IMPLANT
GOWN STRL REUS W/ TWL XL LVL3 (GOWN DISPOSABLE) IMPLANT
GOWN STRL REUS W/TWL LRG LVL3 (GOWN DISPOSABLE)
GOWN STRL REUS W/TWL XL LVL3 (GOWN DISPOSABLE)
KIT BASIN OR (CUSTOM PROCEDURE TRAY) ×3 IMPLANT
KIT TURNOVER KIT B (KITS) ×3 IMPLANT
NEEDLE HYPO 22GX1.5 SAFETY (NEEDLE) ×3 IMPLANT
NS IRRIG 1000ML POUR BTL (IV SOLUTION) ×3 IMPLANT
OIL CARTRIDGE MAESTRO DRILL (MISCELLANEOUS)
PACK EENT II TURBAN DRAPE (CUSTOM PROCEDURE TRAY) ×3 IMPLANT
PAD ARMBOARD 7.5X6 YLW CONV (MISCELLANEOUS) ×9 IMPLANT
SPECIMEN JAR SMALL (MISCELLANEOUS) IMPLANT
STRIP CLOSURE SKIN 1/2X4 (GAUZE/BANDAGES/DRESSINGS) ×2 IMPLANT
SUT VIC AB 3-0 SH 8-18 (SUTURE) ×3 IMPLANT
SYR CONTROL 10ML LL (SYRINGE) ×3 IMPLANT
TOWEL GREEN STERILE (TOWEL DISPOSABLE) ×3 IMPLANT
TOWEL GREEN STERILE FF (TOWEL DISPOSABLE) ×3 IMPLANT
TRAY KYPHOPAK 20/3 ONESTEP 1ST (MISCELLANEOUS) ×3 IMPLANT

## 2018-01-19 NOTE — Anesthesia Postprocedure Evaluation (Signed)
Anesthesia Post Note  Patient: Jeffery Fernandez  Procedure(s) Performed: KYPHOPLASTY LUMBAR THREE (N/A )     Patient location during evaluation: PACU Anesthesia Type: General Level of consciousness: awake and alert Pain management: pain level controlled Vital Signs Assessment: post-procedure vital signs reviewed and stable Respiratory status: spontaneous breathing, nonlabored ventilation, respiratory function stable and patient connected to nasal cannula oxygen Cardiovascular status: blood pressure returned to baseline and stable Postop Assessment: no apparent nausea or vomiting Anesthetic complications: no    Last Vitals:  Vitals:   01/19/18 1750 01/19/18 1800  BP: (!) 194/86 (!) 198/89  Pulse: 70 67  Resp: 16 16  Temp:  36.5 C  SpO2: 100% 95%    Last Pain:  Vitals:   01/19/18 1800  TempSrc:   PainSc: 0-No pain                 Kierrah Kilbride COKER

## 2018-01-19 NOTE — Transfer of Care (Signed)
Immediate Anesthesia Transfer of Care Note  Patient: Jeffery Fernandez  Procedure(s) Performed: KYPHOPLASTY LUMBAR THREE (N/A )  Patient Location: PACU  Anesthesia Type:General  Level of Consciousness: awake, alert  and oriented  Airway & Oxygen Therapy: Patient Spontanous Breathing and Patient connected to face mask oxygen  Post-op Assessment: Report given to RN and Post -op Vital signs reviewed and stable  Post vital signs: Reviewed and stable  Last Vitals:  Vitals Value Taken Time  BP 190/94 01/19/2018  5:32 PM  Temp    Pulse 74 01/19/2018  5:34 PM  Resp 18 01/19/2018  5:34 PM  SpO2 97 % 01/19/2018  5:34 PM  Vitals shown include unvalidated device data.  Last Pain:  Vitals:   01/19/18 1303  TempSrc:   PainSc: 3       Patients Stated Pain Goal: 3 (30/81/68 3870)  Complications: No apparent anesthesia complications

## 2018-01-19 NOTE — H&P (Signed)
Subjective: The patient is a 71 year old white male who took a fall.  He has complained of back pain since.  He has not improved despite medical management.  He was worked up with a lumbar MRI which demonstrated an L3 fracture.  I discussed the various treatment options with the patient including surgery.  He has decided to proceed with a kyphoplasty.  Past Medical History:  Diagnosis Date  . Arrhythmia   . Arthritis   . Cataract    bilateral cataract extraction with intraoccular lens implants  . Chest pain, unspecified   . Chronic airway obstruction, not elsewhere classified    PT. DENIES HE HAS COPD  . CKD (chronic kidney disease)   . Colon polyps    Adenomatous Polyps 2007  . Depression   . Diabetes mellitus   . Diverticulosis   . Gout   . Heart murmur   . History of kidney stones   . Hyperlipemia   . Hypertension   . Nephrolithiasis   . Normocytic anemia 04/23/2017  . Obstructive sleep apnea (adult) (pediatric)   . Other and unspecified hyperlipidemia   . Other left bundle branch block   . Retinal vascular occlusion, unspecified    right  . Shortness of breath    on exertion  . Unspecified sleep apnea   . Vision loss of right eye   . White coat syndrome with diagnosis of hypertension 09/19/2017    Past Surgical History:  Procedure Laterality Date  . ANTERIOR CERVICAL DECOMP/DISCECTOMY FUSION N/A 06/26/2016   Procedure: Re-Exploration of Cervical Wound;  Surgeon: Kary Kos, MD;  Location: [place];  Service: Neurosurgery;  Laterality: N/A;  . ANTERIOR CERVICAL DECOMP/DISCECTOMY FUSION N/A 06/26/2016   Procedure: ANTERIOR CERVICAL DECOMPRESSION/DISCECTOMY FUSION, INTERBODY PROSTESIS, PLATE, CERVICAL THREE CERVICAL FOUR, CERVICAL FOUR CERVICAL FIVE CERVICAL SIX;  Surgeon: Newman Pies, MD;  Location: [place];  Service: Neurosurgery;  Laterality: N/A;  . CATARACT EXTRACTION Bilateral   . KIDNEY STONE SURGERY      Allergies  Allergen Reactions  . Penicillins Other (See  Comments)    Possibly rash - not sure Has patient had a PCN reaction causing immediate rash, facial/tongue/throat swelling, SOB or lightheadedness with hypotension:unknown Has patient had a PCN reaction causing severe rash involving mucus membranes or skin necrosis: Unknown Has patient had a PCN reaction that required hospitalization: Unknown Has patient had a PCN reaction occurring within the last 10 years: No Childhood reaction If all of the above answers are "NO", then may proceed with Cephalosporin use.     Social History   Tobacco Use  . Smoking status: Former Smoker    Packs/day: 1.50    Years: 25.00    Pack years: 37.50    Types: Cigarettes    Last attempt to quit: 01/28/1986    Years since quitting: 31.9  . Smokeless tobacco: Never Used  Substance Use Topics  . Alcohol use: No    Comment: Very rare    Family History  Problem Relation Age of Onset  . Heart attack Brother 60       heart attack and CHF/ also some form of EP ablation  . Parkinson's disease Brother   . Atrial fibrillation Brother   . Heart disease Father   . Stroke Father   . Uterine cancer Mother   . Arthritis/Rheumatoid Cousin   . Diabetes Other   . Colon cancer Neg Hx   . Stomach cancer Neg Hx   . Liver cancer Neg Hx   .  Rectal cancer Neg Hx   . Esophageal cancer Neg Hx    Prior to Admission medications   Medication Sig Start Date End Date Taking? Authorizing Provider  acetaminophen (TYLENOL) 500 MG tablet Take 1,000 mg by mouth every 6 (six) hours as needed (for pain.).   Yes [provider]  amLODipine (NORVASC) 2.5 MG tablet TAKE 1 TABLET BY MOUTH EVERY DAY Patient taking differently: Take 2.5 mg by mouth daily.  10/01/17  Yes Skeet Latch, MD  aspirin 325 MG EC tablet Take 325 mg by mouth daily.   Yes [provider]  atorvastatin (LIPITOR) 40 MG tablet Take 20 mg by mouth at bedtime.    Yes [provider]  carvedilol (COREG) 25 MG tablet TAKE 1 TABLET (25 MG  TOTAL) BY MOUTH 2 (TWO) TIMES DAILY WITH A MEAL. 11/24/17  Yes Skeet Latch, MD  fluticasone [facility]) 50 MCG/ACT nasal spray Place 1 spray into both nostrils daily. Patient taking differently: Place 1 spray into both nostrils daily as needed for allergies.  09/19/17  Yes Skeet Latch, MD  glipiZIDE (GLUCOTROL XL) 10 MG 24 hr tablet Take 10 mg by mouth 2 (two) times daily.   Yes [provider]  hydrochlorothiazide (MICROZIDE) 12.5 MG capsule TAKE 1 CAPSULE BY MOUTH EVERY DAY 12/17/17  Yes Skeet Latch, MD  lisinopril (PRINIVIL,ZESTRIL) 40 MG tablet Take 40 mg by mouth daily.   Yes [provider]  metFORMIN (GLUCOPHAGE) 500 MG tablet Take 1,000 mg by mouth 2 (two) times daily with a meal.    Yes [provider]  pantoprazole (PROTONIX) 40 MG tablet Take 40 mg by mouth daily.   Yes [provider]  sertraline (ZOLOFT) 100 MG tablet Take 100 mg daily by mouth.    Yes [provider]  sitaGLIPtan (JANUVIA) 100 MG tablet Take 100 mg by mouth daily.     Yes [provider]  terazosin (HYTRIN) 5 MG capsule Take 5 mg by mouth at bedtime.    Yes [provider]     Review of Systems  Positive ROS: As above  All other systems have been reviewed and were otherwise negative with the exception of those mentioned in the HPI and as above.  Objective: Vital signs in last 24 hours: Temp:  [97.8 F (36.6 C)] 97.8 F (36.6 C) (12/23 1245) Pulse Rate:  [65] 65 (12/23 1245) Resp:  [18] 18 (12/23 1245) BP: (183)/(68) 183/68 (12/23 1245) SpO2:  [98 %] 98 % (12/23 1245) Weight:  [95.1 kg] 95.1 kg (12/23 1245) Estimated body mass index is 30.97 kg/m as calculated from the following:   Height as of this encounter: 5\' 9"  (1.753 m).   Weight as of this encounter: 95.1 kg.   General Appearance: Alert Head: Normocephalic, without obvious abnormality, atraumatic Eyes: PERRL, conjunctiva/corneas clear, EOM's intact,    Ears: Normal   Throat: Normal  Neck: Supple, Back: unremarkable Lungs: Clear to auscultation bilaterally, respirations unlabored Heart: Regular rate and rhythm, no murmur, rub or gallop Abdomen: Soft, non-tender Extremities: Extremities normal, atraumatic, no cyanosis or edema Skin: unremarkable  NEUROLOGIC:   Mental status: alert and oriented,Motor Exam - grossly normal Sensory Exam - grossly normal Reflexes:  Coordination - grossly normal Gait - grossly normal Balance - grossly normal Cranial Nerves: I: smell Not tested  II: visual acuity  OS: Normal  OD: Normal   II: visual fields Full to confrontation  II: pupils Equal, round, reactive to light  III,VII: ptosis None  III,IV,VI: extraocular  muscles  Full ROM  V: mastication Normal  V: facial light touch sensation  Normal  V,VII: corneal reflex  Present  VII: facial muscle function - upper  Normal  VII: facial muscle function - lower Normal  VIII: hearing Not tested  IX: soft palate elevation  Normal  IX,X: gag reflex Present  XI: trapezius strength  5/5  XI: sternocleidomastoid strength 5/5  XI: neck flexion strength  5/5  XII: tongue strength  Normal    Data Review Lab Results  Component Value Date   WBC 8.6 01/14/2018   HGB 11.7 (L) 01/14/2018   HCT 36.2 (L) 01/14/2018   MCV 92.1 01/14/2018   PLT 154 01/14/2018   Lab Results  Component Value Date   NA 139 01/14/2018   K 3.6 01/14/2018   CL 102 01/14/2018   CO2 23 01/14/2018   BUN 17 01/14/2018   CREATININE 1.64 (H) 01/14/2018   GLUCOSE 212 (H) 01/14/2018   Lab Results  Component Value Date   INR 1.1 (H) 06/08/2010    Assessment/Plan: L3 compression fracture, lumbago: I have discussed the situation with the patient and his wife.  I have reviewed his x-rays and MRI scan with him.  We have discussed the various treatment options including surgery.  I have described the surgical treatment option of a L3 kyphoplasty.  I have shown him surgical models.  I have given  him a surgical pamphlet.  We have discussed the risks, benefits, alternatives, expected postoperative course, and likelihood of achieving our goals with surgery.  I have answered all his questions.  He has decided to proceed with surgery.   Ophelia Charter 01/19/2018 4:06 PM

## 2018-01-19 NOTE — Plan of Care (Signed)
  Problem: Education: Goal: Ability to verbalize activity precautions or restrictions will improve Outcome: Progressing Goal: Knowledge of the prescribed therapeutic regimen will improve Outcome: Progressing Goal: Understanding of discharge needs will improve Outcome: Progressing   Problem: Activity: Goal: Ability to avoid complications of mobility impairment will improve Outcome: Progressing Goal: Ability to tolerate increased activity will improve Outcome: Progressing Goal: Will remain free from falls Outcome: Progressing   Problem: Pain Management: Goal: Pain level will decrease Outcome: Progressing   Problem: Bladder/Genitourinary: Goal: Urinary functional status for postoperative course will improve Outcome: Progressing   Problem: Skin Integrity: Goal: Will show signs of wound healing Outcome: Progressing

## 2018-01-19 NOTE — Progress Notes (Signed)
Subjective: The patient is alert and pleasant.  He is in no apparent distress.  He looks well.  Objective: Vital signs in last 24 hours: Temp:  [97.7 F (36.5 C)-97.8 F (36.6 C)] 97.7 F (36.5 C) (12/23 1735) Pulse Rate:  [65-78] 78 (12/23 1735) Resp:  [16-18] 16 (12/23 1735) BP: (183-190)/(68-94) 190/94 (12/23 1735) SpO2:  [97 %-98 %] 97 % (12/23 1735) Weight:  [95.1 kg] 95.1 kg (12/23 1245) Estimated body mass index is 30.97 kg/m as calculated from the following:   Height as of this encounter: 5\' 9"  (1.753 m).   Weight as of this encounter: 95.1 kg.   Intake/Output from previous day: No intake/output data recorded. Intake/Output this shift: Total I/O In: 1000 [I.V.:1000] Out: 5 [Blood:5]  Physical exam the patient is alert and pleasant.  He is moving his lower extremities well.  Lab Results: No results for input(s): WBC, HGB, HCT, PLT in the last 72 hours. BMET No results for input(s): NA, K, CL, CO2, GLUCOSE, BUN, CREATININE, CALCIUM in the last 72 hours.  Studies/Results: No results found.  Assessment/Plan: The patient is doing well.  I spoke with his wife and daughter.  LOS: 0 days     Ophelia Charter 01/19/2018, 5:48 PM

## 2018-01-19 NOTE — Anesthesia Procedure Notes (Signed)
Procedure Name: Intubation Date/Time: 01/19/2018 4:39 PM Performed by: Teressa Lower., CRNA Pre-anesthesia Checklist: Patient identified, Emergency Drugs available, Suction available and Patient being monitored Patient Re-evaluated:Patient Re-evaluated prior to induction Oxygen Delivery Method: Circle system utilized Preoxygenation: Pre-oxygenation with 100% oxygen Induction Type: IV induction Ventilation: Mask ventilation without difficulty and Oral airway inserted - appropriate to patient size Laryngoscope Size: Mac and 4 Grade View: Grade I Tube type: Oral Tube size: 7.5 mm Number of attempts: 1 Airway Equipment and Method: Stylet and Oral airway Placement Confirmation: ETT inserted through vocal cords under direct vision,  positive ETCO2 and breath sounds checked- equal and bilateral Secured at: 23 cm Tube secured with: Tape Dental Injury: Teeth and Oropharynx as per pre-operative assessment

## 2018-01-19 NOTE — Progress Notes (Signed)
Pharmacy Antibiotic Note  Jeffery Fernandez is a 72 y.o. male admitted on 01/19/2018 for lumbar kyphoplasty in OR.  Pharmacy has been consulted for Vancomycin x 1 dose post-op for surgical prophylaxis.  Penicillin allergy.    Vancomycin 1gm IV given pre-op at 13:13.  No drain.  Plan:  Vancomycin 750 mg IV x 1 ~2am on 01/20/18.  No follow needed.  Pharmacy to sign off.  Height: 5\' 9"  (175.3 cm) Weight: 209 lb 11.2 oz (95.1 kg) IBW/kg (Calculated) : 70.7  Temp (24hrs), Avg:97.7 F (36.5 C), Min:97.5 F (36.4 C), Max:97.8 F (36.6 C)  Recent Labs  Lab 01/14/18 1131  WBC 8.6  CREATININE 1.64*    Estimated Creatinine Clearance: 45 mL/min (A) (by C-G formula based on SCr of 1.64 mg/dL (H)).    Allergies  Allergen Reactions  . Penicillins Other (See Comments)    Possibly rash - not sure Has patient had a PCN reaction causing immediate rash, facial/tongue/throat swelling, SOB or lightheadedness with hypotension:unknown Has patient had a PCN reaction causing severe rash involving mucus membranes or skin necrosis: Unknown Has patient had a PCN reaction that required hospitalization: Unknown Has patient had a PCN reaction occurring within the last 10 years: No Childhood reaction If all of the above answers are "NO", then may proceed with Cephalosporin use.     Antimicrobials this admission:  Vancomycin 12/23>>12/24   Microbiology results:  12/18: MRSA PCR negative  Thank you for allowing pharmacy to be a part of this patient's care.  Arty Baumgartner, [place] Pager: [phone] 01/19/2018 7:12 PM

## 2018-01-19 NOTE — Progress Notes (Signed)
Updated on expected time frame.

## 2018-01-19 NOTE — Op Note (Signed)
Brief history: The patient is a 72 year old white male who fell and suffered an L3 compression fracture.  He has failed medical management.  We discussed the various treatment options including surgery.  He has weighed the risks, benefits, and alternatives surgery and decided proceed with an L3 kyphoplasty.  Preop diagnosis: L3 compression fracture  Postop diagnosis: The same  Procedure: L3 kyphoplasty  Surgeon: Dr. Earle Gell  Assistant: None  Anesthesia: General tracheal  Estimated blood loss: Minimal  Specimens: None  Complications: None  Description of procedure: The patient was brought to the operating room by the anesthesia team.  General endotracheal anesthesia was induced.  The patient was turned to the prone position on the chest rolls.  His lumbar region was then shaved with clippers and prepared with Betadine scrub and Betadine solution.  Sterile drapes were applied.  I then made small incisions over the patient's bilateral L3 pedicles.  Using biplanar fluoroscopy I cannulated the bilateral L3 pedicles.  I then removed the cannula and inserted the balloon.  I inflated the balloon, deflated the balloon and then remove the balloon.  I then injected bone cement under biplanar fluoroscopy.  After I was satisfied with the bone filling I removed the cannulas.  I reapproximated patient's subcutaneous tissue with interrupted 3-0 Vicryl suture.  I reapproximate the skin with Steri-Strips and benzoin.  The wound was then coated with bacitracin ointment.  A sterile dressing was applied.  The drapes were removed.  By report all sponge, instrument, and needle counts were correct at the end this case.

## 2018-01-20 ENCOUNTER — Encounter (HOSPITAL_COMMUNITY): Payer: Self-pay | Admitting: Neurosurgery

## 2018-01-20 DIAGNOSIS — S32039A Unspecified fracture of third lumbar vertebra, initial encounter for closed fracture: Secondary | ICD-10-CM | POA: Diagnosis not present

## 2018-01-20 LAB — GLUCOSE, CAPILLARY: Glucose-Capillary: 362 mg/dL — ABNORMAL HIGH (ref 70–99)

## 2018-01-20 MED ORDER — TRAMADOL HCL 50 MG PO TABS: 50.0000 mg | ORAL_TABLET | Freq: Four times a day (QID) | ORAL | 0 refills | Status: AC | PRN

## 2018-01-20 NOTE — Discharge Summary (Signed)
Physician Discharge Summary  Patient ID: Jeffery Fernandez MRN: [id number] DOB/AGE: [birth year] 72 y.o.  Admit date: 01/19/2018 Discharge date: 01/20/2018  Admission Diagnoses:  Discharge Diagnoses:  Active Problems:   Lumbar compression fracture [facility])   Discharged Condition: good  Hospital Course: Patient status post lumbar kyphoplasty.  Postoperative doing well.  Preoperative back and lower extremity pain much improved.  Standing and walking without difficulty.  Ready for discharge home.  Consults:   Significant Diagnostic Studies:   Treatments:   Discharge Exam: Blood pressure (!) 166/78, pulse 60, temperature 98.4 F (36.9 C), temperature source Oral, resp. rate 18, height 5\' 9"  (1.753 m), weight 95.1 kg, SpO2 94 %. Awake and alert.  Oriented and appropriate.  Cranial nerve function intact.  Motor and sensory function extremity stable.  Wound clean and dry.  Disposition: Discharge disposition: 01-Home or Self Care        Allergies as of 01/20/2018      Reactions   Penicillins Other (See Comments)   Possibly rash - not sure Has patient had a PCN reaction causing immediate rash, facial/tongue/throat swelling, SOB or lightheadedness with hypotension:unknown Has patient had a PCN reaction causing severe rash involving mucus membranes or skin necrosis: Unknown Has patient had a PCN reaction that required hospitalization: Unknown Has patient had a PCN reaction occurring within the last 10 years: No Childhood reaction If all of the above answers are "NO", then may proceed with Cephalosporin use.      Medication List    TAKE these medications   acetaminophen 500 MG tablet Commonly known as:  TYLENOL Take 1,000 mg by mouth every 6 (six) hours as needed (for pain.).   amLODipine 2.5 MG tablet Commonly known as:  NORVASC TAKE 1 TABLET BY MOUTH EVERY DAY   aspirin 325 MG EC tablet Take 325 mg by mouth daily.   atorvastatin 40 MG tablet Commonly known as:   LIPITOR Take 20 mg by mouth at bedtime.   carvedilol 25 MG tablet Commonly known as:  COREG TAKE 1 TABLET (25 MG TOTAL) BY MOUTH 2 (TWO) TIMES DAILY WITH A MEAL.   fluticasone 50 MCG/ACT nasal spray Commonly known as:  FLONASE Place 1 spray into both nostrils daily. What changed:    when to take this  reasons to take this   glipiZIDE 10 MG 24 hr tablet Commonly known as:  GLUCOTROL XL Take 10 mg by mouth 2 (two) times daily.   hydrochlorothiazide 12.5 MG capsule Commonly known as:  MICROZIDE TAKE 1 CAPSULE BY MOUTH EVERY DAY   JANUVIA 100 MG tablet Generic drug:  sitaGLIPtin Take 100 mg by mouth daily.   lisinopril 40 MG tablet Commonly known as:  PRINIVIL,ZESTRIL Take 40 mg by mouth daily.   metFORMIN 500 MG tablet Commonly known as:  GLUCOPHAGE Take 1,000 mg by mouth 2 (two) times daily with a meal.   pantoprazole 40 MG tablet Commonly known as:  PROTONIX Take 40 mg by mouth daily.   sertraline 100 MG tablet Commonly known as:  ZOLOFT Take 100 mg daily by mouth.   terazosin 5 MG capsule Commonly known as:  HYTRIN Take 5 mg by mouth at bedtime.   traMADol 50 MG tablet Commonly known as:  ULTRAM Take 1 tablet (50 mg total) by mouth every 6 (six) hours as needed.        SignedCooper Render Senita Corredor 01/20/2018, 9:24 AM

## 2018-01-20 NOTE — Progress Notes (Signed)
Patient is discharged from room 3C08 at this time. Alert and in stable condition. IV site d/c'd and instructions read to patient and family with understanding verbalized. Left unit via wheelchair with all belongings at side.

## 2018-01-20 NOTE — Discharge Instructions (Signed)
Wound Care Keep incision covered and dry for two days.   Do not put any creams, lotions, or ointments on incision. Leave steri-strips on back.  They will fall off by themselves.  Activity Walk each and every day, increasing distance each day. No lifting greater than 5 lbs.  Avoid excessive neck motion. No driving for 2 weeks; may ride as a passenger locally.  Diet Resume your normal diet.   Return to Work Will be discussed at you follow up appointment.  Call Your Doctor If Any of These Occur Redness, drainage, or swelling at the wound.  Temperature greater than 101 degrees. Severe pain not relieved by pain medication. Incision starts to come apart. Follow Up Appt Call today for appointment in 1-2 weeks ([phone]) or for problems.  If you have any hardware placed in your spine, you will need an x-ray before your appointment.  

## 2018-03-27 ENCOUNTER — Other Ambulatory Visit: Payer: Self-pay | Admitting: Cardiovascular Disease

## 2018-03-28 NOTE — Telephone Encounter (Signed)
OK to refill

## 2018-04-02 ENCOUNTER — Other Ambulatory Visit: Payer: Self-pay | Admitting: Cardiovascular Disease

## 2018-05-28 ENCOUNTER — Other Ambulatory Visit: Payer: Self-pay | Admitting: Cardiovascular Disease

## 2018-05-28 NOTE — Telephone Encounter (Signed)
Carvedilol refilled.

## 2018-06-14 ENCOUNTER — Other Ambulatory Visit: Payer: Self-pay | Admitting: Cardiovascular Disease

## 2018-06-26 ENCOUNTER — Other Ambulatory Visit: Payer: Self-pay | Admitting: Cardiovascular Disease

## 2018-07-09 ENCOUNTER — Other Ambulatory Visit: Payer: Self-pay | Admitting: Cardiovascular Disease

## 2018-10-07 ENCOUNTER — Ambulatory Visit (INDEPENDENT_AMBULATORY_CARE_PROVIDER_SITE_OTHER): Payer: Medicare Other | Admitting: Cardiovascular Disease

## 2018-10-07 ENCOUNTER — Encounter: Payer: Self-pay | Admitting: Cardiovascular Disease

## 2018-10-07 ENCOUNTER — Other Ambulatory Visit: Payer: Self-pay

## 2018-10-07 VITALS — BP 148/68 | HR 61 | Temp 97.5°F | Ht 70.0 in | Wt 206.0 lb

## 2018-10-07 DIAGNOSIS — E7800 Pure hypercholesterolemia, unspecified: Secondary | ICD-10-CM

## 2018-10-07 DIAGNOSIS — I10 Essential (primary) hypertension: Secondary | ICD-10-CM

## 2018-10-07 DIAGNOSIS — I2510 Atherosclerotic heart disease of native coronary artery without angina pectoris: Secondary | ICD-10-CM

## 2018-10-07 NOTE — Progress Notes (Signed)
Cardiology Office Note   Date:  10/07/2018   ID:  Jeffery Fernandez, DOB 29-Jun-1945, MRN [id number]  PCP:  Patient, No Pcp Per  Cardiologist:   Skeet Latch, MD   No chief complaint on file.     History of Present Illness: Jeffery Fernandez is a 73 y.o. male medically managed CAD, hypertension, hyperlipidemia, diabetes, COPD, LBBB, and retinal artery occlusion who presents for follow-up.  He was initially seen for pre-surgical risk assessment 05/2016 prior to cervical spine surgery.  He was found to be at acceptable risk for surgery.  At that appointment his blood pressure was poorly-controlled.  Metoprolol was switched to carvedilol.  He followed up with our pharmacist and his blood pressure remained poorly-controlled so hydralazine was added to his regimen.  He stopped taking this due to dizziness, though his blood pressure still was not well-controlled.  Losartan was switched to irbesartan.  Carvedilol was switched to nebivolol due to fatigue and terazosin was reduced.  His dizziness did not improve so he was switched back to carvedilol.  He underwent C3 through 6 anterior cervical discectomy/decompression with insertion of bone graft and interbody prostheses on 05/2016.  He developed an anterior cervical wound hematoma and possible CSF leak.  He underwent reexploration on 5/30 and was discharged home 6/1.    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  He had an echo and exercise Cardiolite that were unremarkable.  He also wore a 21 day event monitor for three weeks and no atrial fibrillation was noted.  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  In retrospect he thinks that this is what was wrong with his right eye.  Jeffery Fernandez was seen for shortness of breath in 2012 and was thought to have a COPD exacerbation.  However he didn't have a history of COPD.  He was referred for cardiac  catheterization 05/2010, at which time he was noted to have normal right and left heart filling pressures.  He had 50-60 mid RCA stenosis.  Echo revealed LVEF 55% and was otherwise unremarkable.   Jeffery Fernandez had been seen in the emergency department 11/2016 for very elevated blood pressures but left before being seen.  At that appointment nebivolol was switched back to carvedilol and he was given hydralazine as needed.  At his last appointment amlodipine was added.  He has tolerated this dose without increase in swelling.  He had facial swelling at higher doses.  He has followed up with our pharmacist and terazosin was increased to 5 mg on 07/17/2017.   In December he tripped when walking in the dark and had a compression fracture that required surgery.  Most of the time his back feels better but it still hurts when he rides his tractor or does other things that are bumpy.  He notes occasional episodes of fleeting chest pain.  It occurs randomly and is not associated with exertion.  He thinks it happens based on the way he is sleeping.  He has no exertional chest pain or shortness of breath.  He continues to chase after his grandchildren and does yard work.  At home his blood pressure is usually well-controlled.  It is typically in the 120s over 70s, though he notes he has not been checking it much lately.  Overall he has been doing well and is going to see  a new primary care physician next week.  He has been suffering from some problems with his left eye that required him to get regular injections.    Past Medical History:  Diagnosis Date   Arrhythmia    Arthritis    Cataract    bilateral cataract extraction with intraoccular lens implants   Chest pain, unspecified    Chronic airway obstruction, not elsewhere classified    PT. DENIES HE HAS COPD   CKD (chronic kidney disease)    Colon polyps    Adenomatous Polyps 2007   Depression    Diabetes mellitus    Diverticulosis    Gout     Heart murmur    History of kidney stones    Hyperlipemia    Hypertension    Nephrolithiasis    Normocytic anemia 04/23/2017   Obstructive sleep apnea (adult) (pediatric)    Other and unspecified hyperlipidemia    Other left bundle branch block    Retinal vascular occlusion, unspecified    right   Shortness of breath    on exertion   Unspecified sleep apnea    Vision loss of right eye    White coat syndrome with diagnosis of hypertension 09/19/2017    Past Surgical History:  Procedure Laterality Date   ANTERIOR CERVICAL DECOMP/DISCECTOMY FUSION N/A 06/26/2016   Procedure: Re-Exploration of Cervical Wound;  Surgeon: Kary Kos, MD;  Location: [place];  Service: Neurosurgery;  Laterality: N/A;   ANTERIOR CERVICAL DECOMP/DISCECTOMY FUSION N/A 06/26/2016   Procedure: ANTERIOR CERVICAL DECOMPRESSION/DISCECTOMY FUSION, INTERBODY PROSTESIS, PLATE, CERVICAL THREE CERVICAL FOUR, CERVICAL FOUR CERVICAL FIVE CERVICAL SIX;  Surgeon: Newman Pies, MD;  Location: [place];  Service: Neurosurgery;  Laterality: N/A;   CATARACT EXTRACTION Bilateral    KIDNEY STONE SURGERY     KYPHOPLASTY N/A 01/19/2018   Procedure: KYPHOPLASTY LUMBAR THREE;  Surgeon: Newman Pies, MD;  Location: [place];  Service: Neurosurgery;  Laterality: N/A;  KYPHOPLASTY LUMBAR THREE     Current Outpatient Medications  Medication Sig Dispense Refill   acetaminophen (TYLENOL) 500 MG tablet Take 1,000 mg by mouth every 6 (six) hours as needed (for pain.).     amLODipine (NORVASC) 2.5 MG tablet TAKE 1 TABLET BY MOUTH EVERY DAY 90 tablet 2   aspirin 325 MG EC tablet Take 325 mg by mouth daily.     atorvastatin (LIPITOR) 40 MG tablet Take 20 mg by mouth at bedtime.      carvedilol (COREG) 25 MG tablet TAKE 1 TABLET BY MOUTH 2 TIMES DAILY WITH A MEAL. 180 tablet 1   fluticasone (FLONASE) 50 MCG/ACT nasal spray Place 1 spray into both nostrils daily. (Patient taking differently: Place 1 spray into both nostrils  daily as needed for allergies. ) 11.1 g 0   glipiZIDE (GLUCOTROL XL) 10 MG 24 hr tablet Take 10 mg by mouth 2 (two) times daily.     hydrochlorothiazide (MICROZIDE) 12.5 MG capsule TAKE 1 CAPSULE BY MOUTH EVERY DAY 90 capsule 3   lisinopril (ZESTRIL) 40 MG tablet Take 1 tablet (40 mg total) by mouth daily. CALL FOR APPOINTMENT IN AUGUST 90 tablet 0   metFORMIN (GLUCOPHAGE) 500 MG tablet Take 1,000 mg by mouth 2 (two) times daily with a meal.      pantoprazole (PROTONIX) 40 MG tablet Take 40 mg by mouth daily.     sertraline (ZOLOFT) 100 MG tablet Take 100 mg daily by mouth.      sitaGLIPtan (JANUVIA) 100 MG tablet Take 100 mg by mouth  daily.       terazosin (HYTRIN) 5 MG capsule TAKE 1 CAPSULE BY MOUTH EVERYDAY AT BEDTIME 30 capsule 3   traMADol (ULTRAM) 50 MG tablet Take 1 tablet (50 mg total) by mouth every 6 (six) hours as needed. 20 tablet 0   No current facility-administered medications for this visit.     Allergies:   Penicillins    Social History:  The patient  reports that he quit smoking about 32 years ago. His smoking use included cigarettes. He has a 37.50 pack-year smoking history. He has never used smokeless tobacco. He reports that he does not drink alcohol or use drugs.   Family History:  The patient's family history includes Arthritis/Rheumatoid in his cousin; Atrial fibrillation in his brother; Diabetes in an other family member; Heart attack (age of onset: 50) in his brother; Heart disease in his father; Parkinson's disease in his brother; Stroke in his father; Uterine cancer in his mother.    ROS:  Please see the history of present illness.   Otherwise, review of systems are positive for none.   All other systems are reviewed and negative.    PHYSICAL EXAM: VS:  BP (!) 148/68    Pulse 61    Temp (!) 97.5 F (36.4 C)    Ht 5\' 10"  (1.778 m)    Wt 206 lb (93.4 kg)    SpO2 95%    BMI 29.56 kg/m  , BMI Body mass index is 29.56 kg/m. GENERAL:  Well appearing.  No  acute distress HEENT: Pupils equal round and reactive, fundi not visualized, oral mucosa unremarkable NECK:  No jugular venous distention, waveform within normal limits, carotid upstroke brisk and symmetric, no bruits, no thyromegaly LYMPHATICS:  No cervical adenopathy LUNGS:  Clear to auscultation bilaterally HEART:  RRR.  PMI not displaced or sustained,S1 and S2 within normal limits, no S3, no S4, no clicks, no rubs, II/VI systolic murmur at the LUSB ABD:  Flat, positive bowel sounds normal in frequency in pitch, no bruits, no rebound, no guarding, no midline pulsatile mass, no hepatomegaly, no splenomegaly EXT:  2 plus pulses throughout, no edema, no cyanosis no clubbing SKIN:  No rashes no nodules NEURO:  Cranial nerves II through XII grossly intact, motor grossly intact throughout PSYCH:  Cognitively intact, oriented to person place and time   EKG:  EKG is ordered today. The ekg ordered 06/19/16 demonstrates sinus bradycardia.  LBBB.  LAD.   01/01/17:  Sinus rhythm.  Rate 64 bpm.  Left bundle branch block.  Left axis deviation. 09/19/2017: Sinus rhythm.  Rate 61 bpm.  Left bundle branch block.  Left axis deviation. 10/07/18: Sinus rhythm.  Rte 61 bpm.  LBBB.  LAD   Recent Labs: 01/14/2018: BUN 17; Creatinine, Ser 1.64; Hemoglobin 11.7; Platelets 154; Potassium 3.6; Sodium 139    Lipid Panel    Component Value Date/Time   CHOL 127 06/08/2010 1110   TRIG 122.0 06/08/2010 1110   HDL 26.80 (L) 06/08/2010 1110   CHOLHDL 5 06/08/2010 1110   VLDL 24.4 06/08/2010 1110   LDLCALC 76 06/08/2010 1110   03/26/16: Total cholesterol 129, triglycerides 140, HDL 33, LDL 68   Wt Readings from Last 3 Encounters:  10/07/18 206 lb (93.4 kg)  01/19/18 209 lb 11.2 oz (95.1 kg)  01/14/18 209 lb 11.2 oz (95.1 kg)      ASSESSMENT AND PLAN:  # CAD: Jeffery Fernandez has non-obstructive CAD that is medically managed.  Continue aspirin, carvedilol, and atorvastatin.  He is on aspirin 325mg  per neurology for  prior stroke.    # Hypertension: # White coat hypertension: Blood pressure remains elevated here but has been controlled at home. He isnt' checking it often.  He will bring to follow up.  Continue amlodipine, carvedilol, HCTZ, and lisinopril.  # Hyperlipidemia: Continue atorvastatin.  LDL was 68 02/2016.  Check lipids/CMP with new PCP next week.  Current medicines are reviewed at length with the patient today.  The patient does not have concerns regarding medicines.  The following changes have been made: none  Labs/ tests ordered today include:   No orders of the defined types were placed in this encounter.    Disposition:   FU with Shon Mansouri C. Oval Linsey, MD, [facility] in 2 months.     Signed, Sharice Harriss C. Oval Linsey, MD, [facility]  10/07/2018 3:25 PM     [facility]

## 2018-10-07 NOTE — Patient Instructions (Signed)
Medication Instructions:  Your physician recommends that you continue on your current medications as directed. Please refer to the Current Medication list given to you today.  If you need a refill on your cardiac medications before your next appointment, please call your pharmacy.   Lab work: NONE  Testing/Procedures: NONE  Follow-Up: At [facility], you and your health needs are our priority.  As part of our continuing mission to provide you with exceptional heart care, we have created designated Provider Care Teams.  These Care Teams include your primary Cardiologist (physician) and Advanced Practice Providers (APPs -  Physician Assistants and Nurse Practitioners) who all work together to provide you with the care you need, when you need it. You will need a follow up appointment in 2 months.    You may see Skeet Latch, MD or one of the following Advanced Practice Providers on your designated Care Team:   Kerin Ransom, PA-C Roby Lofts, [place] . Sande Rives, PA-C  Any Other Special Instructions Will Be Listed Below (If Applicable). MONITOR AND LOG YOUR BLOOD PRESSURE AT HOME, BRING WITH YOU TO YOUR FOLLOW UP VISIT

## 2018-12-02 ENCOUNTER — Other Ambulatory Visit: Payer: Self-pay | Admitting: Cardiovascular Disease

## 2018-12-09 ENCOUNTER — Other Ambulatory Visit: Payer: Self-pay | Admitting: Cardiovascular Disease

## 2018-12-14 ENCOUNTER — Other Ambulatory Visit: Payer: Self-pay | Admitting: Cardiovascular Disease

## 2018-12-17 ENCOUNTER — Ambulatory Visit: Payer: Medicare Other | Admitting: Cardiovascular Disease

## 2018-12-25 ENCOUNTER — Other Ambulatory Visit: Payer: Self-pay | Admitting: Cardiovascular Disease

## 2019-01-15 DIAGNOSIS — M21612 Bunion of left foot: Secondary | ICD-10-CM | POA: Insufficient documentation

## 2019-03-09 ENCOUNTER — Other Ambulatory Visit: Payer: Self-pay | Admitting: Cardiovascular Disease

## 2019-03-09 NOTE — Telephone Encounter (Signed)
Rx has been sent to the pharmacy electronically. ° °

## 2019-05-05 ENCOUNTER — Ambulatory Visit (INDEPENDENT_AMBULATORY_CARE_PROVIDER_SITE_OTHER): Payer: Medicare Other | Admitting: Ophthalmology

## 2019-05-05 ENCOUNTER — Other Ambulatory Visit: Payer: Self-pay

## 2019-05-05 ENCOUNTER — Encounter (INDEPENDENT_AMBULATORY_CARE_PROVIDER_SITE_OTHER): Payer: Self-pay | Admitting: Ophthalmology

## 2019-05-05 DIAGNOSIS — G4733 Obstructive sleep apnea (adult) (pediatric): Secondary | ICD-10-CM | POA: Diagnosis not present

## 2019-05-05 DIAGNOSIS — H34832 Tributary (branch) retinal vein occlusion, left eye, with macular edema: Secondary | ICD-10-CM

## 2019-05-05 DIAGNOSIS — H47012 Ischemic optic neuropathy, left eye: Secondary | ICD-10-CM

## 2019-05-05 MED ORDER — BEVACIZUMAB CHEMO INJECTION 1.25MG/0.05ML SYRINGE FOR KALEIDOSCOPE
1.2500 mg | INTRAVITREAL | Status: AC | PRN
  Administered 2019-05-05: 1.25 mg via INTRAVITREAL

## 2019-05-05 NOTE — Patient Instructions (Signed)
RESUME CPAP USE

## 2019-05-05 NOTE — Assessment & Plan Note (Signed)
The nature of branch retinal vein occlusion with macular edema was discussed.  The patient was given access to printed information.  The treatment options including continued observation looking for spontaneous resolution versus grid laser versus intravitreal Kenalog injection were discussed.  PRIMARY THERAPY CONSISTS of Anti-VEGF Therapies, AVASTIN, LUCENTIS AND EYLEA.  Their usage was discussed to assist in halting the progression of Macular Edema, in order to preserve, protect or improve acuity.  Additionally, at times, limited focal laser therapy is used in the management.  The risks and benefits of all these options were discussed with the patient.  The patient's questions were answered. 

## 2019-05-05 NOTE — Progress Notes (Signed)
05/05/2019     CHIEF COMPLAINT Patient presents for Retina Follow Up   HISTORY OF PRESENT ILLNESS: Jeffery Fernandez is a 74 y.o. male who presents to the clinic today for:   HPI    Retina Follow Up    Patient presents with  CRVO/BRVO.  In left eye.  Duration of 10 weeks.  Since onset it is stable.          Comments    10 week follow up - OCT OU, Possible Avastin OS Patient denies change in vision and overall has no complaints. LBS 279 this AM A1C "in the 10's" 03/2019        Last edited by Gerda Diss, [place] on 05/05/2019  8:35 AM. (History)      Referring physician: No referring provider defined for this encounter.  HISTORICAL INFORMATION:   Selected notes from the MEDICAL RECORD NUMBER    Lab Results  Component Value Date   HGBA1C 6.1 (H) 01/19/2018     CURRENT MEDICATIONS: No current outpatient medications on file. (Ophthalmic Drugs)   No current facility-administered medications for this visit. (Ophthalmic Drugs)   Current Outpatient Medications (Other)  Medication Sig  . acetaminophen (TYLENOL) 500 MG tablet Take 1,000 mg by mouth every 6 (six) hours as needed (for pain.).  Marland Kitchen amLODipine (NORVASC) 2.5 MG tablet TAKE 1 TABLET BY MOUTH EVERY DAY (Patient not taking: Reported on 05/05/2019)  . amLODipine (NORVASC) 5 MG tablet Take 5 mg by mouth daily.  Marland Kitchen aspirin 325 MG EC tablet Take 325 mg by mouth daily.  Marland Kitchen atorvastatin (LIPITOR) 40 MG tablet Take 20 mg by mouth at bedtime.   . carvedilol (COREG) 25 MG tablet TAKE 1 TABLET BY MOUTH 2 TIMES DAILY WITH A MEAL.  . fluticasone (FLONASE) 50 MCG/ACT nasal spray Place 1 spray into both nostrils daily. (Patient taking differently: Place 1 spray into both nostrils daily as needed for allergies. )  . glipiZIDE (GLUCOTROL XL) 10 MG 24 hr tablet Take 10 mg by mouth 2 (two) times daily.  . hydrochlorothiazide (MICROZIDE) 12.5 MG capsule TAKE 1 CAPSULE BY MOUTH EVERY DAY  . Insulin Glargine (BASAGLAR KWIKPEN) 100 UNIT/ML  SMARTSIG:12 Unit(s) SUB-Q Daily  . lisinopril (ZESTRIL) 40 MG tablet Take 1 tablet (40 mg total) by mouth daily.  . metFORMIN (GLUCOPHAGE) 500 MG tablet Take 1,000 mg by mouth 2 (two) times daily with a meal.   . pantoprazole (PROTONIX) 40 MG tablet Take 40 mg by mouth daily.  . pioglitazone (ACTOS) 30 MG tablet Take 30 mg by mouth daily.  . sertraline (ZOLOFT) 100 MG tablet Take 100 mg daily by mouth.   . sitaGLIPtan (JANUVIA) 100 MG tablet Take 100 mg by mouth daily.    Marland Kitchen terazosin (HYTRIN) 5 MG capsule TAKE 1 CAPSULE BY MOUTH EVERYDAY AT BEDTIME   No current facility-administered medications for this visit. (Other)      REVIEW OF SYSTEMS:    ALLERGIES Allergies  Allergen Reactions  . Penicillins Other (See Comments)    Possibly rash - not sure Has patient had a PCN reaction causing immediate rash, facial/tongue/throat swelling, SOB or lightheadedness with hypotension:unknown Has patient had a PCN reaction causing severe rash involving mucus membranes or skin necrosis: Unknown Has patient had a PCN reaction that required hospitalization: Unknown Has patient had a PCN reaction occurring within the last 10 years: No Childhood reaction If all of the above answers are "NO", then may proceed with Cephalosporin use.  PAST MEDICAL HISTORY Past Medical History:  Diagnosis Date  . Arrhythmia   . Arthritis   . Cataract    bilateral cataract extraction with intraoccular lens implants  . Chest pain, unspecified   . Chronic airway obstruction, not elsewhere classified    PT. DENIES HE HAS COPD  . CKD (chronic kidney disease)   . Colon polyps    Adenomatous Polyps 2007  . Depression   . Diabetes mellitus   . Diverticulosis   . Gout   . Heart murmur   . History of kidney stones   . Hyperlipemia   . Hypertension   . Nephrolithiasis   . Normocytic anemia 04/23/2017  . Obstructive sleep apnea (adult) (pediatric)   . Other and unspecified hyperlipidemia   . Other left bundle  branch block   . Retinal vascular occlusion, unspecified    right  . Shortness of breath    on exertion  . Unspecified sleep apnea   . Vision loss of right eye   . White coat syndrome with diagnosis of hypertension 09/19/2017   Past Surgical History:  Procedure Laterality Date  . ANTERIOR CERVICAL DECOMP/DISCECTOMY FUSION N/A 06/26/2016   Procedure: Re-Exploration of Cervical Wound;  Surgeon: Kary Kos, MD;  Location: [place];  Service: Neurosurgery;  Laterality: N/A;  . ANTERIOR CERVICAL DECOMP/DISCECTOMY FUSION N/A 06/26/2016   Procedure: ANTERIOR CERVICAL DECOMPRESSION/DISCECTOMY FUSION, INTERBODY PROSTESIS, PLATE, CERVICAL THREE CERVICAL FOUR, CERVICAL FOUR CERVICAL FIVE CERVICAL SIX;  Surgeon: Newman Pies, MD;  Location: [place];  Service: Neurosurgery;  Laterality: N/A;  . CATARACT EXTRACTION Bilateral   . KIDNEY STONE SURGERY    . KYPHOPLASTY N/A 01/19/2018   Procedure: KYPHOPLASTY LUMBAR THREE;  Surgeon: Newman Pies, MD;  Location: [place];  Service: Neurosurgery;  Laterality: N/A;  KYPHOPLASTY LUMBAR THREE    FAMILY HISTORY Family History  Problem Relation Age of Onset  . Heart attack Brother 60       heart attack and CHF/ also some form of EP ablation  . Parkinson's disease Brother   . Atrial fibrillation Brother   . Heart disease Father   . Stroke Father   . Uterine cancer Mother   . Arthritis/Rheumatoid Cousin   . Diabetes Other   . Colon cancer Neg Hx   . Stomach cancer Neg Hx   . Liver cancer Neg Hx   . Rectal cancer Neg Hx   . Esophageal cancer Neg Hx     SOCIAL HISTORY Social History   Tobacco Use  . Smoking status: Former Smoker    Packs/day: 1.50    Years: 25.00    Pack years: 37.50    Types: Cigarettes    Quit date: 01/28/1986    Years since quitting: 33.2  . Smokeless tobacco: Never Used  Substance Use Topics  . Alcohol use: No    Comment: Very rare  . Drug use: No         OPHTHALMIC EXAM:  Base Eye Exam    Visual Acuity (Snellen -  Linear)      Right Left   Dist cc CF at 1' 20/25-2   Dist ph cc NI    Correction: Glasses       Tonometry (Tonopen, 8:35 AM)      Right Left   Pressure 16 15       Pupils      Pupils Dark Light Shape React APD   Right PERRL 3 3 Round Minimal None   Left PERRL 3 2 Round Brisk  None       Visual Fields      Left Right    Full    Restrictions  Total inferior temporal deficiency       Extraocular Movement      Right Left    Full Full       Neuro/Psych    Oriented x3: Yes   Mood/Affect: Normal       Dilation    Left eye: 1.0% Mydriacyl, 2.5% Phenylephrine @ 8:35 AM        Slit Lamp and Fundus Exam    External Exam      Right Left   External Normal Normal       Slit Lamp Exam      Right Left   Lids/Lashes Normal Normal   Conjunctiva/Sclera White and quiet White and quiet   Cornea Clear Clear   Anterior Chamber Deep and quiet Deep and quiet   Iris Round and reactive Round and reactive   Lens Posterior chamber intraocular lens Posterior chamber intraocular lens   Vitreous Normal Normal       Fundus Exam      Right Left   Disc Normal Normal   C/D Ratio  0.4   Macula Normal Microaneurysms, Macular thickening,, milld CME, Cystoid macular edema,, FROM MACULAR BRVO   Vessels Normal Normal,, BRVO,,,   Periphery Normal Normal          IMAGING AND PROCEDURES  Imaging and Procedures for 05/05/19  CME OS RECURRENT FOR BRVO,,, REPEAT AVASTIN OS NOW AND EXAM IN 8 WEEKS         ASSESSMENT/PLAN:    ICD-10-CM   1. 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 OSA (obstructive sleep apnea)  G47.33     OSA (obstructive sleep apnea) Patient is noncompliant with the use of CPAP I encouraged the patient to restart its use because of his  direct effect on optic nerve and macular perfusion.  Patient indicated his understanding.  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  The patient was given access to printed information.  The treatment options including continued observation looking for spontaneous resolution versus grid laser versus intravitreal Kenalog injection were discussed.  PRIMARY THERAPY CONSISTS of Anti-VEGF Therapies, AVASTIN, LUCENTIS AND EYLEA.  Their usage was discussed to assist in halting the progression of Macular Edema, in order to preserve, protect or improve acuity.  Additionally, at times, limited focal laser therapy is used in the management.  The risks and benefits of all these options were discussed with the patient.  The patient's questions were answered.      Ophthalmic Meds Ordered this visit:  Meds ordered this encounter  Medications  . Bevacizumab (AVASTIN) SOLN 1.25 mg       Return in about 8 weeks (around 06/30/2019) for AVASTIN OCT.  There are no Patient Instructions on file for this visit.   Explained the diagnoses, plan, and follow up with the patient and they expressed understanding.  Patient expressed understanding of the importance of proper follow up care.   Clent Demark Troye Hiemstra M.D.  Diseases & Surgery of the Retina and Vitreous Retina & Diabetic [place] 05/05/19     Abbreviations: M myopia (nearsighted); A astigmatism; H hyperopia (farsighted); P presbyopia; Mrx spectacle prescription;  CTL contact lenses; OD right eye; OS left eye; OU both eyes  XT exotropia; ET esotropia; PEK punctate epithelial keratitis; PEE punctate epithelial erosions; DES dry eye syndrome; MGD meibomian gland dysfunction; ATs artificial tears; PFAT's preservative free artificial tears; [employer] nuclear sclerotic cataract; PSC posterior  subcapsular cataract; ERM epi-retinal membrane; PVD posterior vitreous detachment; RD retinal detachment; DM diabetes mellitus; DR diabetic retinopathy; NPDR non-proliferative diabetic retinopathy; PDR proliferative diabetic retinopathy; CSME clinically significant macular edema; DME diabetic macular edema; dbh dot blot hemorrhages; CWS cotton wool spot; POAG primary open angle glaucoma; C/D cup-to-disc ratio; HVF humphrey visual field; GVF goldmann visual field; OCT optical coherence tomography; IOP intraocular pressure; BRVO Branch retinal vein occlusion; CRVO central retinal vein occlusion; CRAO central retinal artery occlusion; BRAO branch retinal artery occlusion; RT retinal tear; SB scleral buckle; PPV pars plana vitrectomy; VH Vitreous hemorrhage; PRP panretinal laser photocoagulation; IVK intravitreal kenalog; VMT vitreomacular traction; MH Macular hole;  NVD neovascularization of the disc; NVE neovascularization elsewhere; AREDS age related eye disease study; ARMD age related macular degeneration; POAG primary open angle glaucoma; EBMD epithelial/anterior basement membrane dystrophy; ACIOL anterior chamber intraocular lens; IOL intraocular lens; PCIOL posterior chamber intraocular lens; Phaco/IOL phacoemulsification with intraocular lens placement; [place] photorefractive keratectomy; LASIK laser assisted in situ keratomileusis; HTN hypertension; DM diabetes mellitus; COPD chronic obstructive pulmonary disease

## 2019-05-05 NOTE — Assessment & Plan Note (Signed)
Patient is noncompliant with the use of CPAP I encouraged the patient to restart its use because of his direct effect on optic nerve and macular perfusion.  Patient indicated his understanding.  He should follow-up with his primary care and/or pulmonologist who monitors

## 2019-06-30 ENCOUNTER — Encounter (INDEPENDENT_AMBULATORY_CARE_PROVIDER_SITE_OTHER): Payer: Medicare Other | Admitting: Ophthalmology

## 2019-07-14 ENCOUNTER — Ambulatory Visit (INDEPENDENT_AMBULATORY_CARE_PROVIDER_SITE_OTHER): Payer: Medicare Other | Admitting: Ophthalmology

## 2019-07-14 ENCOUNTER — Encounter (INDEPENDENT_AMBULATORY_CARE_PROVIDER_SITE_OTHER): Payer: Self-pay | Admitting: Ophthalmology

## 2019-07-14 ENCOUNTER — Other Ambulatory Visit: Payer: Self-pay

## 2019-07-14 DIAGNOSIS — H34832 Tributary (branch) retinal vein occlusion, left eye, with macular edema: Secondary | ICD-10-CM | POA: Diagnosis not present

## 2019-07-14 MED ORDER — BEVACIZUMAB CHEMO INJECTION 1.25MG/0.05ML SYRINGE FOR KALEIDOSCOPE
1.2500 mg | INTRAVITREAL | Status: AC | PRN
  Administered 2019-07-14: 1.25 mg via INTRAVITREAL

## 2019-07-14 NOTE — Assessment & Plan Note (Signed)
Macular edema OS improved 10-week interval after repeat injection of intravitreal Avastin OS.  Repeat intravitreal Avastin OS today, and examination in 10 weeks

## 2019-07-14 NOTE — Progress Notes (Signed)
07/14/2019     CHIEF COMPLAINT Patient presents for Retina Follow Up   HISTORY OF PRESENT ILLNESS: Jeffery Fernandez is a 74 y.o. male who presents to the clinic today for:   HPI    Retina Follow Up    Patient presents with  CRVO/BRVO.  In left eye.  This started 10 weeks ago.  Severity is mild.  Duration of 10 weeks.  Since onset it is gradually worsening.          Comments    10 Week BRVO F/U OS, poss Avastin OS  Pt c/o blur centrally OS. Pt denies any other new symptoms.        Last edited by Rockie Neighbours, [place] on 07/14/2019  8:52 AM. (History)      Referring physician: Lilian Coma., MD No address on file  HISTORICAL INFORMATION:   Selected notes from the MEDICAL RECORD NUMBER    Lab Results  Component Value Date   HGBA1C 6.1 (H) 01/19/2018     CURRENT MEDICATIONS: No current outpatient medications on file. (Ophthalmic Drugs)   No current facility-administered medications for this visit. (Ophthalmic Drugs)   Current Outpatient Medications (Other)  Medication Sig  . acetaminophen (TYLENOL) 500 MG tablet Take 1,000 mg by mouth every 6 (six) hours as needed (for pain.).  Marland Kitchen amLODipine (NORVASC) 2.5 MG tablet TAKE 1 TABLET BY MOUTH EVERY DAY (Patient not taking: Reported on 05/05/2019)  . amLODipine (NORVASC) 5 MG tablet Take 5 mg by mouth daily.  Marland Kitchen aspirin 325 MG EC tablet Take 325 mg by mouth daily.  Marland Kitchen atorvastatin (LIPITOR) 40 MG tablet Take 20 mg by mouth at bedtime.   . carvedilol (COREG) 25 MG tablet TAKE 1 TABLET BY MOUTH 2 TIMES DAILY WITH A MEAL.  . fluticasone (FLONASE) 50 MCG/ACT nasal spray Place 1 spray into both nostrils daily. (Patient taking differently: Place 1 spray into both nostrils daily as needed for allergies. )  . glipiZIDE (GLUCOTROL XL) 10 MG 24 hr tablet Take 10 mg by mouth 2 (two) times daily.  . hydrochlorothiazide (MICROZIDE) 12.5 MG capsule TAKE 1 CAPSULE BY MOUTH EVERY DAY  . Insulin Glargine (BASAGLAR KWIKPEN) 100 UNIT/ML  SMARTSIG:12 Unit(s) SUB-Q Daily  . lisinopril (ZESTRIL) 40 MG tablet Take 1 tablet (40 mg total) by mouth daily.  . metFORMIN (GLUCOPHAGE) 500 MG tablet Take 1,000 mg by mouth 2 (two) times daily with a meal.   . pantoprazole (PROTONIX) 40 MG tablet Take 40 mg by mouth daily.  . pioglitazone (ACTOS) 30 MG tablet Take 30 mg by mouth daily.  . sertraline (ZOLOFT) 100 MG tablet Take 100 mg daily by mouth.   . sitaGLIPtan (JANUVIA) 100 MG tablet Take 100 mg by mouth daily.    Marland Kitchen terazosin (HYTRIN) 5 MG capsule TAKE 1 CAPSULE BY MOUTH EVERYDAY AT BEDTIME   No current facility-administered medications for this visit. (Other)      REVIEW OF SYSTEMS:    ALLERGIES Allergies  Allergen Reactions  . Penicillins Other (See Comments)    Possibly rash - not sure Has patient had a PCN reaction causing immediate rash, facial/tongue/throat swelling, SOB or lightheadedness with hypotension:unknown Has patient had a PCN reaction causing severe rash involving mucus membranes or skin necrosis: Unknown Has patient had a PCN reaction that required hospitalization: Unknown Has patient had a PCN reaction occurring within the last 10 years: No Childhood reaction If all of the above answers are "NO", then may proceed with Cephalosporin use.  PAST MEDICAL HISTORY Past Medical History:  Diagnosis Date  . Arrhythmia   . Arthritis   . Cataract    bilateral cataract extraction with intraoccular lens implants  . Chest pain, unspecified   . Chronic airway obstruction, not elsewhere classified    PT. DENIES HE HAS COPD  . CKD (chronic kidney disease)   . Colon polyps    Adenomatous Polyps 2007  . Depression   . Diabetes mellitus   . Diverticulosis   . Gout   . Heart murmur   . History of kidney stones   . Hyperlipemia   . Hypertension   . Nephrolithiasis   . Normocytic anemia 04/23/2017  . Obstructive sleep apnea (adult) (pediatric)   . Other and unspecified hyperlipidemia   . Other left bundle  branch block   . Retinal vascular occlusion, unspecified    right  . Shortness of breath    on exertion  . Unspecified sleep apnea   . Vision loss of right eye   . White coat syndrome with diagnosis of hypertension 09/19/2017   Past Surgical History:  Procedure Laterality Date  . ANTERIOR CERVICAL DECOMP/DISCECTOMY FUSION N/A 06/26/2016   Procedure: Re-Exploration of Cervical Wound;  Surgeon: Kary Kos, MD;  Location: [facility];  Service: Neurosurgery;  Laterality: N/A;  . ANTERIOR CERVICAL DECOMP/DISCECTOMY FUSION N/A 06/26/2016   Procedure: ANTERIOR CERVICAL DECOMPRESSION/DISCECTOMY FUSION, INTERBODY PROSTESIS, PLATE, CERVICAL THREE CERVICAL FOUR, CERVICAL FOUR CERVICAL FIVE CERVICAL SIX;  Surgeon: Newman Pies, MD;  Location: [facility];  Service: Neurosurgery;  Laterality: N/A;  . CATARACT EXTRACTION Bilateral   . KIDNEY STONE SURGERY    . KYPHOPLASTY N/A 01/19/2018   Procedure: KYPHOPLASTY LUMBAR THREE;  Surgeon: Newman Pies, MD;  Location: [facility];  Service: Neurosurgery;  Laterality: N/A;  KYPHOPLASTY LUMBAR THREE    FAMILY HISTORY Family History  Problem Relation Age of Onset  . Heart attack Brother 60       heart attack and CHF/ also some form of EP ablation  . Parkinson's disease Brother   . Atrial fibrillation Brother   . Heart disease Father   . Stroke Father   . Uterine cancer Mother   . Arthritis/Rheumatoid Cousin   . Diabetes Other   . Colon cancer Neg Hx   . Stomach cancer Neg Hx   . Liver cancer Neg Hx   . Rectal cancer Neg Hx   . Esophageal cancer Neg Hx     SOCIAL HISTORY Social History   Tobacco Use  . Smoking status: Former Smoker    Packs/day: 1.50    Years: 25.00    Pack years: 37.50    Types: Cigarettes    Quit date: 01/28/1986    Years since quitting: 33.4  . Smokeless tobacco: Never Used  Vaping Use  . Vaping Use: Never used  Substance Use Topics  . Alcohol use: No    Comment: Very rare  . Drug use: No         OPHTHALMIC EXAM:  Base  Eye Exam    Visual Acuity (ETDRS)      Right Left   Dist cc CF @ 1' 20/40 +1   Dist ph cc NI 20/40 +2   Correction: Glasses       Tonometry (Tonopen, 8:56 AM)      Right Left   Pressure 14 19       Pupils      Pupils Dark Light Shape React APD   Right PERRL 3 3 Round Minimal  None   Left PERRL 3 2 Round Brisk None       Visual Fields (Counting fingers)      Left Right    Full    Restrictions  Total inferior temporal deficiency       Extraocular Movement      Right Left    Full Full       Neuro/Psych    Oriented x3: Yes   Mood/Affect: Normal       Dilation    Left eye: 1.0% Mydriacyl, 2.5% Phenylephrine @ 8:56 AM        Slit Lamp and Fundus Exam    External Exam      Right Left   External Normal Normal       Slit Lamp Exam      Right Left   Lids/Lashes Normal Normal   Conjunctiva/Sclera White and quiet White and quiet   Cornea Clear Clear   Anterior Chamber Deep and quiet Deep and quiet   Iris Round and reactive Round and reactive   Lens Posterior chamber intraocular lens Posterior chamber intraocular lens   Vitreous Normal Normal       Fundus Exam      Right Left   Disc  Normal   C/D Ratio  0.4   Macula  Microaneurysms, Macular thickening,, milld CME, Cystoid macular edema,, FROM MACULAR BRVO   Vessels  Normal,, BRVO,,,   Periphery  Normal          IMAGING AND PROCEDURES  Imaging and Procedures for 07/14/19  OCT, Retina - OU - Both Eyes       Right Eye Quality was good. Scan locations included subfoveal. Central Foveal Thickness: 237. Findings include outer retinal atrophy, central retinal atrophy.   Left Eye Quality was good. Scan locations included subfoveal. Central Foveal Thickness: 265. Findings include cystoid macular edema.   Notes Less CME from macular branch retinal vein occlusion OS on 10-week follow-up examination.  Will repeat intravitreal Avastin OS to control CME and monocular patient.  Repeat examination and evaluation  in 10 weeks       Intravitreal Injection, Pharmacologic Agent - OS - Left Eye       Time Out 07/14/2019. 10:17 AM. Confirmed correct patient, procedure, site, and patient consented.   Anesthesia Topical anesthesia was used. Anesthetic medications included Akten 3.5%.   Procedure Preparation included Tobramycin 0.3%, 10% betadine to eyelids. A 30 gauge needle was used.   Injection:  1.25 mg Bevacizumab (AVASTIN) SOLN   NDC: [address]-4, Lot: [id number]   Route: Intravitreal, Site: Left Eye, Waste: 0 mg  Post-op Post injection exam found visual acuity of at least counting fingers. The patient tolerated the procedure well. There were no complications. The patient received written and verbal post procedure care education. Post injection medications were not given.                 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  Repeat intravitreal Avastin OS today, and examination in 10 weeks      ICD-10-CM   1. 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  Overall macular edema OS is improved, and in the 10-week interval  will repeat intravitreal Avastin today to maintain excellent visual functioning.  2.  Pete examination in 10 weeks.  3.  Ophthalmic Meds Ordered this visit:  Meds ordered this encounter  Medications  . Bevacizumab (AVASTIN) SOLN 1.25 mg       Return in about 10 weeks (around 09/22/2019) for OS, AVASTIN OCT.  There are no Patient Instructions on file for this visit.   Explained the diagnoses, plan, and follow up with the patient and they expressed understanding.  Patient expressed understanding of the importance of proper follow up care.   Clent Demark Annlouise Gerety  M.D. Diseases & Surgery of the Retina and Vitreous Retina & Diabetic [place] 07/14/19     Abbreviations: M myopia (nearsighted); A astigmatism; H hyperopia (farsighted); P presbyopia; Mrx spectacle prescription;  CTL contact lenses; OD right eye; OS left eye; OU both eyes  XT exotropia; ET esotropia; PEK punctate epithelial keratitis; PEE punctate epithelial erosions; DES dry eye syndrome; MGD meibomian gland dysfunction; ATs artificial tears; PFAT's preservative free artificial tears; [place] nuclear sclerotic cataract; PSC posterior subcapsular cataract; ERM epi-retinal membrane; PVD posterior vitreous detachment; RD retinal detachment; DM diabetes mellitus; DR diabetic retinopathy; NPDR non-proliferative diabetic retinopathy; PDR proliferative diabetic retinopathy; CSME clinically significant macular edema; DME diabetic macular edema; dbh dot blot hemorrhages; CWS cotton wool spot; POAG primary open angle glaucoma; C/D cup-to-disc ratio; HVF humphrey visual field; GVF goldmann visual field; OCT optical coherence tomography; IOP intraocular pressure; BRVO Branch retinal vein occlusion; CRVO central retinal vein occlusion; CRAO central retinal artery occlusion; BRAO branch retinal artery occlusion; RT retinal tear; SB scleral buckle; PPV pars plana vitrectomy; VH Vitreous hemorrhage; PRP panretinal laser photocoagulation; IVK intravitreal kenalog; VMT vitreomacular traction; MH Macular hole;  NVD neovascularization of the disc; NVE neovascularization elsewhere; AREDS age related eye disease study; ARMD age related macular degeneration; POAG primary open angle glaucoma; EBMD epithelial/anterior basement membrane dystrophy; ACIOL anterior chamber intraocular lens; IOL intraocular lens; PCIOL posterior chamber intraocular lens; Phaco/IOL phacoemulsification with intraocular lens placement; [place] photorefractive keratectomy; LASIK laser assisted in situ keratomileusis; HTN hypertension; DM diabetes mellitus; COPD  chronic obstructive pulmonary disease

## 2019-08-26 IMAGING — RF DG LUMBAR SPINE 2-3V
1 series · 1 of 1 positions shown · non-contrast
Comparison: Lumbar spine x-rays dated January 07, 2018.

CLINICAL DATA: Kyphoplasty.

EXAM:
LUMBAR SPINE - 2-3 VIEW; DG C-ARM 61-120 MIN

[Series 1: run · 1 of 1 slices shown]
[im 1/1]
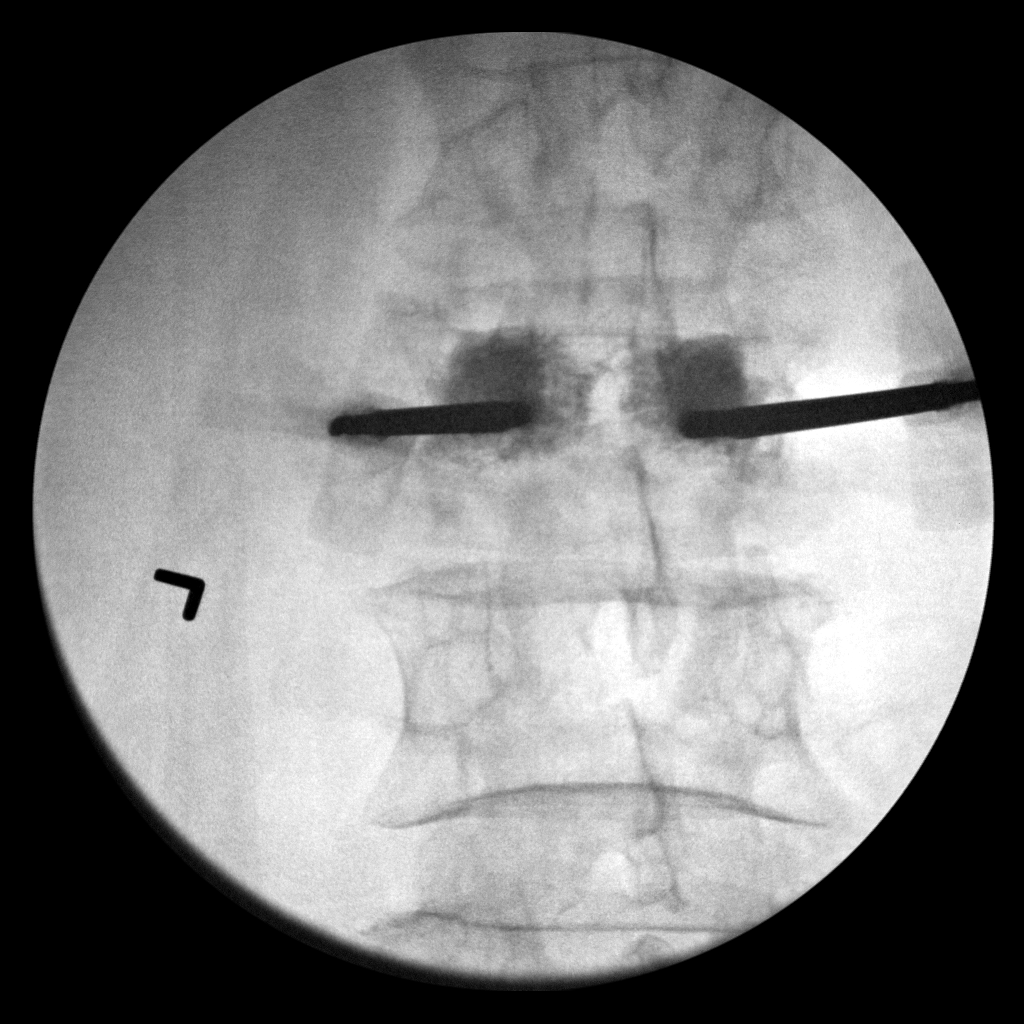

[1 of 1 positions shown; findings below may reference images not displayed]

FLUOROSCOPY TIME:  1 minutes, 19 seconds.

C-arm fluoroscopic images were obtained intraoperatively and
submitted for post operative interpretation.
FINDINGS: AP and lateral intraoperative fluoroscopic images demonstrate
interval L3 kyphoplasty. Vertebral body height is unchanged.
IMPRESSION: Intraoperative fluoroscopic guidance for L3 kyphoplasty.

## 2019-09-06 ENCOUNTER — Other Ambulatory Visit: Payer: Self-pay | Admitting: Cardiovascular Disease

## 2019-09-22 ENCOUNTER — Other Ambulatory Visit: Payer: Self-pay

## 2019-09-22 ENCOUNTER — Ambulatory Visit (INDEPENDENT_AMBULATORY_CARE_PROVIDER_SITE_OTHER): Payer: Medicare Other | Admitting: Ophthalmology

## 2019-09-22 ENCOUNTER — Encounter (INDEPENDENT_AMBULATORY_CARE_PROVIDER_SITE_OTHER): Payer: Self-pay | Admitting: Ophthalmology

## 2019-09-22 DIAGNOSIS — H34832 Tributary (branch) retinal vein occlusion, left eye, with macular edema: Secondary | ICD-10-CM | POA: Diagnosis not present

## 2019-09-22 MED ORDER — BEVACIZUMAB CHEMO INJECTION 1.25MG/0.05ML SYRINGE FOR KALEIDOSCOPE
1.2500 mg | INTRAVITREAL | Status: AC | PRN
  Administered 2019-09-22: 1.25 mg via INTRAVITREAL

## 2019-09-22 NOTE — Progress Notes (Signed)
09/22/2019     CHIEF COMPLAINT Patient presents for Retina Follow Up   HISTORY OF PRESENT ILLNESS: Jeffery Fernandez is a 74 y.o. male who presents to the clinic today for:   HPI    Retina Follow Up    Patient presents with  CRVO/BRVO.  In left eye.  This started 10 weeks ago.  Severity is mild.  Duration of 10 weeks.  Since onset it is stable.          Comments    10 Week BRVO F/U OS, poss Avastin OS  Pt denies noticeable changes to [place] OU since last visit. Pt denies ocular pain, flashes of light, or floaters OU.         Last edited by Rockie Neighbours, [place] on 09/22/2019  8:42 AM. (History)      Referring physician: Lilian Coma., MD No address on file  HISTORICAL INFORMATION:   Selected notes from the MEDICAL RECORD NUMBER    Lab Results  Component Value Date   HGBA1C 6.1 (H) 01/19/2018     CURRENT MEDICATIONS: No current outpatient medications on file. (Ophthalmic Drugs)   No current facility-administered medications for this visit. (Ophthalmic Drugs)   Current Outpatient Medications (Other)  Medication Sig  . acetaminophen (TYLENOL) 500 MG tablet Take 1,000 mg by mouth every 6 (six) hours as needed (for pain.).  Marland Kitchen amLODipine (NORVASC) 2.5 MG tablet TAKE 1 TABLET BY MOUTH EVERY DAY (Patient not taking: Reported on 05/05/2019)  . amLODipine (NORVASC) 5 MG tablet Take 5 mg by mouth daily.  Marland Kitchen aspirin 325 MG EC tablet Take 325 mg by mouth daily.  Marland Kitchen atorvastatin (LIPITOR) 40 MG tablet Take 20 mg by mouth at bedtime.   . carvedilol (COREG) 25 MG tablet TAKE 1 TABLET BY MOUTH 2 TIMES DAILY WITH A MEAL.  . fluticasone (FLONASE) 50 MCG/ACT nasal spray Place 1 spray into both nostrils daily. (Patient taking differently: Place 1 spray into both nostrils daily as needed for allergies. )  . glipiZIDE (GLUCOTROL XL) 10 MG 24 hr tablet Take 10 mg by mouth 2 (two) times daily.  . hydrochlorothiazide (MICROZIDE) 12.5 MG capsule TAKE 1 CAPSULE BY MOUTH EVERY DAY  . Insulin  Glargine (BASAGLAR KWIKPEN) 100 UNIT/ML SMARTSIG:12 Unit(s) SUB-Q Daily  . lisinopril (ZESTRIL) 40 MG tablet TAKE 1 TABLET BY MOUTH EVERY DAY  . metFORMIN (GLUCOPHAGE) 500 MG tablet Take 1,000 mg by mouth 2 (two) times daily with a meal.   . pantoprazole (PROTONIX) 40 MG tablet Take 40 mg by mouth daily.  . pioglitazone (ACTOS) 30 MG tablet Take 30 mg by mouth daily.  . sertraline (ZOLOFT) 100 MG tablet Take 100 mg daily by mouth.   . sitaGLIPtan (JANUVIA) 100 MG tablet Take 100 mg by mouth daily.    Marland Kitchen terazosin (HYTRIN) 5 MG capsule TAKE 1 CAPSULE BY MOUTH EVERYDAY AT BEDTIME   No current facility-administered medications for this visit. (Other)      REVIEW OF SYSTEMS:    ALLERGIES Allergies  Allergen Reactions  . Penicillins Other (See Comments)    Possibly rash - not sure Has patient had a PCN reaction causing immediate rash, facial/tongue/throat swelling, SOB or lightheadedness with hypotension:unknown Has patient had a PCN reaction causing severe rash involving mucus membranes or skin necrosis: Unknown Has patient had a PCN reaction that required hospitalization: Unknown Has patient had a PCN reaction occurring within the last 10 years: No Childhood reaction If all of the above answers are "NO",  then may proceed with Cephalosporin use.     PAST MEDICAL HISTORY Past Medical History:  Diagnosis Date  . Arrhythmia   . Arthritis   . Cataract    bilateral cataract extraction with intraoccular lens implants  . Chest pain, unspecified   . Chronic airway obstruction, not elsewhere classified    PT. DENIES HE HAS COPD  . CKD (chronic kidney disease)   . Colon polyps    Adenomatous Polyps 2007  . Depression   . Diabetes mellitus   . Diverticulosis   . Gout   . Heart murmur   . History of kidney stones   . Hyperlipemia   . Hypertension   . Nephrolithiasis   . Normocytic anemia 04/23/2017  . Obstructive sleep apnea (adult) (pediatric)   . Other and unspecified  hyperlipidemia   . Other left bundle branch block   . Retinal vascular occlusion, unspecified    right  . Shortness of breath    on exertion  . Unspecified sleep apnea   . Vision loss of right eye   . White coat syndrome with diagnosis of hypertension 09/19/2017   Past Surgical History:  Procedure Laterality Date  . ANTERIOR CERVICAL DECOMP/DISCECTOMY FUSION N/A 06/26/2016   Procedure: Re-Exploration of Cervical Wound;  Surgeon: Kary Kos, MD;  Location: [facility];  Service: Neurosurgery;  Laterality: N/A;  . ANTERIOR CERVICAL DECOMP/DISCECTOMY FUSION N/A 06/26/2016   Procedure: ANTERIOR CERVICAL DECOMPRESSION/DISCECTOMY FUSION, INTERBODY PROSTESIS, PLATE, CERVICAL THREE CERVICAL FOUR, CERVICAL FOUR CERVICAL FIVE CERVICAL SIX;  Surgeon: Newman Pies, MD;  Location: [facility];  Service: Neurosurgery;  Laterality: N/A;  . CATARACT EXTRACTION Bilateral   . KIDNEY STONE SURGERY    . KYPHOPLASTY N/A 01/19/2018   Procedure: KYPHOPLASTY LUMBAR THREE;  Surgeon: Newman Pies, MD;  Location: [facility];  Service: Neurosurgery;  Laterality: N/A;  KYPHOPLASTY LUMBAR THREE    FAMILY HISTORY Family History  Problem Relation Age of Onset  . Heart attack Brother 60       heart attack and CHF/ also some form of EP ablation  . Parkinson's disease Brother   . Atrial fibrillation Brother   . Heart disease Father   . Stroke Father   . Uterine cancer Mother   . Arthritis/Rheumatoid Cousin   . Diabetes Other   . Colon cancer Neg Hx   . Stomach cancer Neg Hx   . Liver cancer Neg Hx   . Rectal cancer Neg Hx   . Esophageal cancer Neg Hx     SOCIAL HISTORY Social History   Tobacco Use  . Smoking status: Former Smoker    Packs/day: 1.50    Years: 25.00    Pack years: 37.50    Types: Cigarettes    Quit date: 01/28/1986    Years since quitting: 33.6  . Smokeless tobacco: Never Used  Vaping Use  . Vaping Use: Never used  Substance Use Topics  . Alcohol use: No    Comment: Very rare  . Drug use: No           OPHTHALMIC EXAM:  Base Eye Exam    Visual Acuity (ETDRS)      Right Left   Dist cc CF @ 1' 20/30 +2   Dist ph cc NI NI   Correction: Glasses       Tonometry (Tonopen, 8:43 AM)      Right Left   Pressure 12 10       Pupils      Dark Light Shape React  APD   Right 3 3 Round Minimal None   Left 3 2 Round Brisk None       Visual Fields      Left Right   Restrictions  Total inferior temporal deficiency       Extraocular Movement      Right Left    Full Full       Neuro/Psych    Oriented x3: Yes   Mood/Affect: Normal       Dilation    Left eye: 1.0% Mydriacyl, 2.5% Phenylephrine @ 8:45 AM        Slit Lamp and Fundus Exam    External Exam      Right Left   External Normal Normal       Slit Lamp Exam      Right Left   Lids/Lashes Normal Normal   Conjunctiva/Sclera White and quiet White and quiet   Cornea Clear Clear   Anterior Chamber Deep and quiet Deep and quiet   Iris Round and reactive Round and reactive   Lens Posterior chamber intraocular lens Posterior chamber intraocular lens   Anterior Vitreous Normal Normal       Fundus Exam      Right Left   Posterior Vitreous Normal Normal   Disc  Normal   C/D Ratio  0.4   Macula  Microaneurysms, Macular thickening,, milld CME, Cystoid macular edema,, FROM MACULAR BRVO   Vessels   BRVO,,, superior   Periphery  Normal          IMAGING AND PROCEDURES  Imaging and Procedures for 09/22/19  OCT, Retina - OU - Both Eyes       Right Eye Quality was good. Scan locations included subfoveal. Central Foveal Thickness: 231. Progression has been stable.   Left Eye Quality was good. Scan locations included subfoveal. Central Foveal Thickness: 262. Progression has been stable. Findings include cystoid macular edema.   Notes OS with CME superior the fovea, still stable at 10-week follow-up interval.  We will repeat today to maintain       Intravitreal Injection, Pharmacologic Agent - OS - Left  Eye       Time Out 09/22/2019. 9:47 AM. Confirmed correct patient, procedure, site, and patient consented.   Anesthesia Topical anesthesia was used. Anesthetic medications included Akten 3.5%.   Procedure Preparation included Tobramycin 0.3%, Ofloxacin , 10% betadine to eyelids, 5% betadine to ocular surface. A supplied needle was used.   Injection:  1.25 mg Bevacizumab (AVASTIN) SOLN   NDC: [address]-2   Route: Intravitreal, Site: Left Eye, Waste: 0 mg  Post-op Post injection exam found visual acuity of at least counting fingers. The patient tolerated the procedure well. There were no complications. The patient received written and verbal post procedure care education. Post injection medications were not given.                 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  We will maintain intravitreal injection Avastin OS today in the same interval next      ICD-10-CM   1. 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  Repeat injection  Avastin OS today and examination in 10 weeks  2.  3.  Ophthalmic Meds Ordered this visit:  Meds ordered this encounter  Medications  . Bevacizumab (AVASTIN) SOLN 1.25 mg       Return in about 10 weeks (around 12/01/2019) for dilate, OS, AVASTIN OCT.  Patient Instructions  To report new onset visual acuity decline or distortions immediately    Explained the diagnoses, plan, and follow up with the patient and they expressed understanding.  Patient expressed understanding of the importance of proper follow up care.   Clent Demark Gari Trovato M.D. Diseases & Surgery of the Retina and Vitreous Retina & Diabetic [place] 09/22/19     Abbreviations: M myopia (nearsighted); A astigmatism; H hyperopia (farsighted); P presbyopia; Mrx spectacle prescription;  CTL contact lenses; OD right eye; OS left eye; OU both eyes  XT exotropia; ET esotropia; PEK punctate epithelial keratitis; PEE punctate epithelial erosions; DES dry eye syndrome; MGD meibomian gland dysfunction; ATs artificial tears; PFAT's preservative free artificial tears; [place] nuclear sclerotic cataract; PSC posterior subcapsular cataract; ERM epi-retinal membrane; PVD posterior vitreous detachment; RD retinal detachment; DM diabetes mellitus; DR diabetic retinopathy; NPDR non-proliferative diabetic retinopathy; PDR proliferative diabetic retinopathy; CSME clinically significant macular edema; DME diabetic macular edema; dbh dot blot hemorrhages; CWS cotton wool spot; POAG primary open angle glaucoma; C/D cup-to-disc ratio; HVF humphrey visual field; GVF goldmann visual field; OCT optical coherence tomography; IOP intraocular pressure; BRVO Branch retinal vein occlusion; CRVO central retinal vein occlusion; CRAO central retinal artery occlusion; BRAO branch retinal artery occlusion; RT retinal tear; SB scleral buckle; PPV pars plana vitrectomy; VH Vitreous hemorrhage; PRP panretinal laser photocoagulation; IVK intravitreal kenalog; VMT vitreomacular traction; MH Macular hole;  NVD neovascularization of the disc; NVE neovascularization elsewhere; AREDS age related eye disease study; ARMD age related macular degeneration; POAG primary open angle glaucoma; EBMD epithelial/anterior basement membrane dystrophy; ACIOL anterior chamber intraocular lens; IOL intraocular lens; PCIOL posterior chamber intraocular lens; Phaco/IOL phacoemulsification with intraocular lens placement; [place] photorefractive keratectomy; LASIK laser assisted in situ keratomileusis; HTN hypertension; DM diabetes mellitus; COPD chronic obstructive pulmonary disease

## 2019-09-22 NOTE — Assessment & Plan Note (Signed)
Likely active branch retinal vein occlusion with CME superior to the fovea left eye, stable now and improved at 10-week interval.  We will maintain intravitreal injection Avastin OS today in the same interval next

## 2019-09-22 NOTE — Patient Instructions (Signed)
To report new onset visual acuity decline or distortions immediately

## 2019-09-30 ENCOUNTER — Other Ambulatory Visit: Payer: Self-pay | Admitting: Cardiovascular Disease

## 2019-12-01 ENCOUNTER — Ambulatory Visit (INDEPENDENT_AMBULATORY_CARE_PROVIDER_SITE_OTHER): Payer: Medicare Other | Admitting: Ophthalmology

## 2019-12-01 ENCOUNTER — Encounter (INDEPENDENT_AMBULATORY_CARE_PROVIDER_SITE_OTHER): Payer: Self-pay | Admitting: Ophthalmology

## 2019-12-01 ENCOUNTER — Other Ambulatory Visit: Payer: Self-pay

## 2019-12-01 DIAGNOSIS — H34832 Tributary (branch) retinal vein occlusion, left eye, with macular edema: Secondary | ICD-10-CM

## 2019-12-01 DIAGNOSIS — H47012 Ischemic optic neuropathy, left eye: Secondary | ICD-10-CM

## 2019-12-01 MED ORDER — BEVACIZUMAB CHEMO INJECTION 1.25MG/0.05ML SYRINGE FOR KALEIDOSCOPE
1.2500 mg | INTRAVITREAL | Status: AC | PRN
  Administered 2019-12-01: 1.25 mg via INTRAVITREAL

## 2019-12-01 NOTE — Assessment & Plan Note (Signed)
CME from BRVO, largely stable does not involve the center of the macula yet, increased at 10 weeks.  Repeat intravitreal Avastin today to stabilize may consider focal laser treatment in the future

## 2019-12-01 NOTE — Progress Notes (Signed)
12/01/2019     CHIEF COMPLAINT Patient presents for Retina Follow Up   HISTORY OF PRESENT ILLNESS: Jeffery Fernandez is a 74 y.o. male who presents to the clinic today for:   HPI    Retina Follow Up    Patient presents with  CRVO/BRVO.  In left eye.  Severity is moderate.  Duration of 10 weeks.  Since onset it is stable.  I, the attending physician,  performed the HPI with the patient and updated documentation appropriately.          Comments    10 Week f\u BRVO f\u OS. Possible avastin OS. OCT  Pt c/o occasional blurry vision. Pt had COVID in Sept.       Last edited by Tilda Franco on 12/01/2019  8:34 AM. (History)      Referring physician: Lilian Coma., MD No address on file  HISTORICAL INFORMATION:   Selected notes from the MEDICAL RECORD NUMBER    Lab Results  Component Value Date   HGBA1C 6.1 (H) 01/19/2018     CURRENT MEDICATIONS: No current outpatient medications on file. (Ophthalmic Drugs)   No current facility-administered medications for this visit. (Ophthalmic Drugs)   Current Outpatient Medications (Other)  Medication Sig  . acetaminophen (TYLENOL) 500 MG tablet Take 1,000 mg by mouth every 6 (six) hours as needed (for pain.).  Marland Kitchen amLODipine (NORVASC) 2.5 MG tablet TAKE 1 TABLET BY MOUTH EVERY DAY (Patient not taking: Reported on 05/05/2019)  . amLODipine (NORVASC) 5 MG tablet Take 5 mg by mouth daily.  Marland Kitchen aspirin 325 MG EC tablet Take 325 mg by mouth daily.  Marland Kitchen atorvastatin (LIPITOR) 40 MG tablet Take 20 mg by mouth at bedtime.   . carvedilol (COREG) 25 MG tablet TAKE 1 TABLET BY MOUTH 2 TIMES DAILY WITH A MEAL.  . fluticasone (FLONASE) 50 MCG/ACT nasal spray Place 1 spray into both nostrils daily. (Patient taking differently: Place 1 spray into both nostrils daily as needed for allergies. )  . glipiZIDE (GLUCOTROL XL) 10 MG 24 hr tablet Take 10 mg by mouth 2 (two) times daily.  . hydrochlorothiazide (MICROZIDE) 12.5 MG capsule TAKE 1 CAPSULE BY  MOUTH EVERY DAY  . Insulin Glargine (BASAGLAR KWIKPEN) 100 UNIT/ML SMARTSIG:12 Unit(s) SUB-Q Daily  . lisinopril (ZESTRIL) 40 MG tablet TAKE 1 TABLET BY MOUTH EVERY DAY  . metFORMIN (GLUCOPHAGE) 500 MG tablet Take 1,000 mg by mouth 2 (two) times daily with a meal.   . pantoprazole (PROTONIX) 40 MG tablet Take 40 mg by mouth daily.  . pioglitazone (ACTOS) 30 MG tablet Take 30 mg by mouth daily.  . sertraline (ZOLOFT) 100 MG tablet Take 100 mg daily by mouth.   . sitaGLIPtan (JANUVIA) 100 MG tablet Take 100 mg by mouth daily.    Marland Kitchen terazosin (HYTRIN) 5 MG capsule TAKE 1 CAPSULE BY MOUTH EVERYDAY AT BEDTIME   No current facility-administered medications for this visit. (Other)      REVIEW OF SYSTEMS:    ALLERGIES Allergies  Allergen Reactions  . Penicillins Other (See Comments)    Possibly rash - not sure Has patient had a PCN reaction causing immediate rash, facial/tongue/throat swelling, SOB or lightheadedness with hypotension:unknown Has patient had a PCN reaction causing severe rash involving mucus membranes or skin necrosis: Unknown Has patient had a PCN reaction that required hospitalization: Unknown Has patient had a PCN reaction occurring within the last 10 years: No Childhood reaction If all of the above answers are "NO",  then may proceed with Cephalosporin use.     PAST MEDICAL HISTORY Past Medical History:  Diagnosis Date  . Arrhythmia   . Arthritis   . Cataract    bilateral cataract extraction with intraoccular lens implants  . Chest pain, unspecified   . Chronic airway obstruction, not elsewhere classified    PT. DENIES HE HAS COPD  . CKD (chronic kidney disease)   . Colon polyps    Adenomatous Polyps 2007  . Depression   . Diabetes mellitus   . Diverticulosis   . Gout   . Heart murmur   . History of kidney stones   . Hyperlipemia   . Hypertension   . Nephrolithiasis   . Normocytic anemia 04/23/2017  . Obstructive sleep apnea (adult) (pediatric)   .  Other and unspecified hyperlipidemia   . Other left bundle branch block   . Retinal vascular occlusion, unspecified    right  . Shortness of breath    on exertion  . Unspecified sleep apnea   . Vision loss of right eye   . White coat syndrome with diagnosis of hypertension 09/19/2017   Past Surgical History:  Procedure Laterality Date  . ANTERIOR CERVICAL DECOMP/DISCECTOMY FUSION N/A 06/26/2016   Procedure: Re-Exploration of Cervical Wound;  Surgeon: Kary Kos, MD;  Location: [facility];  Service: Neurosurgery;  Laterality: N/A;  . ANTERIOR CERVICAL DECOMP/DISCECTOMY FUSION N/A 06/26/2016   Procedure: ANTERIOR CERVICAL DECOMPRESSION/DISCECTOMY FUSION, INTERBODY PROSTESIS, PLATE, CERVICAL THREE CERVICAL FOUR, CERVICAL FOUR CERVICAL FIVE CERVICAL SIX;  Surgeon: Newman Pies, MD;  Location: [facility];  Service: Neurosurgery;  Laterality: N/A;  . CATARACT EXTRACTION Bilateral   . KIDNEY STONE SURGERY    . KYPHOPLASTY N/A 01/19/2018   Procedure: KYPHOPLASTY LUMBAR THREE;  Surgeon: Newman Pies, MD;  Location: [facility];  Service: Neurosurgery;  Laterality: N/A;  KYPHOPLASTY LUMBAR THREE    FAMILY HISTORY Family History  Problem Relation Age of Onset  . Heart attack Brother 60       heart attack and CHF/ also some form of EP ablation  . Parkinson's disease Brother   . Atrial fibrillation Brother   . Heart disease Father   . Stroke Father   . Uterine cancer Mother   . Arthritis/Rheumatoid Cousin   . Diabetes Other   . Colon cancer Neg Hx   . Stomach cancer Neg Hx   . Liver cancer Neg Hx   . Rectal cancer Neg Hx   . Esophageal cancer Neg Hx     SOCIAL HISTORY Social History   Tobacco Use  . Smoking status: Former Smoker    Packs/day: 1.50    Years: 25.00    Pack years: 37.50    Types: Cigarettes    Quit date: 01/28/1986    Years since quitting: 33.8  . Smokeless tobacco: Never Used  Vaping Use  . Vaping Use: Never used  Substance Use Topics  . Alcohol use: No    Comment: Very  rare  . Drug use: No         OPHTHALMIC EXAM:  Base Eye Exam    Visual Acuity (Snellen - Linear)      Right Left   Dist cc CF @ 1' 20/30 -2   Dist ph cc NI    Correction: Glasses       Tonometry (Tonopen, 8:38 AM)      Right Left   Pressure 13 10       Pupils      Pupils Dark Light  Shape React APD   Right PERRL 3 3 Round Minimal None   Left PERRL 3 2 Round Slow None       Visual Fields (Counting fingers)      Left Right    Full    Restrictions  Partial outer superior temporal, inferior temporal, superior nasal deficiencies       Neuro/Psych    Oriented x3: Yes   Mood/Affect: Normal       Dilation    Left eye: 1.0% Mydriacyl, 2.5% Phenylephrine @ 8:38 AM        Slit Lamp and Fundus Exam    External Exam      Right Left   External Normal Normal       Slit Lamp Exam      Right Left   Lids/Lashes Normal Normal   Conjunctiva/Sclera White and quiet White and quiet   Cornea Clear Clear   Anterior Chamber Deep and quiet Deep and quiet   Iris Round and reactive Round and reactive   Lens Posterior chamber intraocular lens Posterior chamber intraocular lens   Anterior Vitreous Normal Normal       Fundus Exam      Right Left   Posterior Vitreous  Normal   Disc  Normal   C/D Ratio  0.4   Macula  Microaneurysms, Macular thickening,, milld CME, Cystoid macular edema superior to the FAZ,, FROM MACULAR BRVO   Vessels   BRVO,,, superior   Periphery  Normal          IMAGING AND PROCEDURES  Imaging and Procedures for 12/01/19  OCT, Retina - OU - Both Eyes       Right Eye Quality was good. Scan locations included subfoveal. Central Foveal Thickness: 233. Progression has been stable. Findings include normal observations.   Left Eye Quality was good. Scan locations included subfoveal. Central Foveal Thickness: 284. Progression has been stable. Findings include cystoid macular edema.   Notes Increased CME superior to the fovea, noncenter involved left  eye, increased at 10 weeks.  Secondary to BRVO OS.       Intravitreal Injection, Pharmacologic Agent - OS - Left Eye       Time Out 12/01/2019. 9:27 AM. Confirmed correct patient, procedure, site, and patient consented.   Anesthesia Topical anesthesia was used. Anesthetic medications included Akten 3.5%.   Procedure Preparation included Ofloxacin , Tobramycin 0.3%, 10% betadine to eyelids, 5% betadine to ocular surface. A 30 gauge needle was used.   Injection:  1.25 mg Bevacizumab (AVASTIN) SOLN   NDC: 70360-001-02, Lot: [id number]   Route: Intravitreal, Site: Left Eye, Waste: 0 mg  Post-op Post injection exam found visual acuity of at least counting fingers. The patient tolerated the procedure well. There were no complications. The patient received written and verbal post procedure care education. Post injection medications were not given.                 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  BRVO with CME, slightly recurrent CME at 10-week interval post Avastin  2.  Repeat intravitreal Avastin OS today follow-up in 4 weeks for fluorescein angiography and likely focal laser treatment to decrease treatment burden of injections  3.  Ophthalmic Meds Ordered this visit:  Meds ordered this encounter  Medications  . Bevacizumab (AVASTIN) SOLN 1.25 mg       Return in  about 4 weeks (around 12/29/2019) for DILATE OU, OPTOS FFA L/R, COLOR FP, FOCAL, OS.  There are no Patient Instructions on file for this visit.   Explained the diagnoses, plan, and follow up with the patient and they expressed understanding.  Patient expressed understanding of the importance of proper follow up care.   Clent Demark Elienai Gailey M.D. Diseases & Surgery of the Retina and Vitreous [facility] [place] 12/01/19     Abbreviations: M myopia (nearsighted); A astigmatism; H hyperopia (farsighted); P presbyopia; Mrx spectacle prescription;  CTL contact lenses; OD right eye; OS left eye; OU both eyes  XT exotropia; ET esotropia; PEK punctate epithelial keratitis; PEE punctate epithelial erosions; DES dry eye syndrome; MGD meibomian gland dysfunction; ATs artificial tears; PFAT's preservative free artificial tears; [place] nuclear sclerotic cataract; PSC posterior subcapsular cataract; ERM epi-retinal membrane; PVD posterior vitreous detachment; RD retinal detachment; DM diabetes mellitus; DR diabetic retinopathy; NPDR non-proliferative diabetic retinopathy; PDR proliferative diabetic retinopathy; CSME clinically significant macular edema; DME diabetic macular edema; dbh dot blot hemorrhages; CWS cotton wool spot; POAG primary open angle glaucoma; C/D cup-to-disc ratio; HVF humphrey visual field; GVF goldmann visual field; OCT optical coherence tomography; IOP intraocular pressure; BRVO Branch retinal vein occlusion; CRVO central retinal vein occlusion; CRAO central retinal artery occlusion; BRAO branch retinal artery occlusion; RT retinal tear; SB scleral buckle; PPV pars plana vitrectomy; VH Vitreous hemorrhage; PRP panretinal laser photocoagulation; IVK intravitreal kenalog; VMT vitreomacular traction; MH Macular hole;  NVD neovascularization of the disc; NVE neovascularization elsewhere; AREDS age related eye disease study; ARMD age related macular degeneration; POAG primary open angle glaucoma;  EBMD epithelial/anterior basement membrane dystrophy; ACIOL anterior chamber intraocular lens; IOL intraocular lens; PCIOL posterior chamber intraocular lens; Phaco/IOL phacoemulsification with intraocular lens placement; [place] photorefractive keratectomy; LASIK laser assisted in situ keratomileusis; HTN hypertension; DM diabetes mellitus; COPD chronic obstructive pulmonary disease

## 2019-12-01 NOTE — Assessment & Plan Note (Signed)
Stable, slight optic nerve pallor

## 2020-01-03 ENCOUNTER — Other Ambulatory Visit: Payer: Self-pay

## 2020-01-03 ENCOUNTER — Ambulatory Visit (INDEPENDENT_AMBULATORY_CARE_PROVIDER_SITE_OTHER): Payer: Medicare Other | Admitting: Ophthalmology

## 2020-01-03 ENCOUNTER — Encounter (INDEPENDENT_AMBULATORY_CARE_PROVIDER_SITE_OTHER): Payer: Self-pay | Admitting: Ophthalmology

## 2020-01-03 DIAGNOSIS — H34832 Tributary (branch) retinal vein occlusion, left eye, with macular edema: Secondary | ICD-10-CM | POA: Diagnosis not present

## 2020-01-03 MED ORDER — FLUORESCEIN SODIUM 10 % IV SOLN
500.0000 mg | INTRAVENOUS | Status: AC | PRN
  Administered 2020-01-03: 500 mg via INTRAVENOUS

## 2020-01-03 NOTE — Patient Instructions (Signed)
Patient instructed to contact the office promptly for new onset visual acuities of this clients  Patient also formed that a small little dark spot might develop inferior or below the center of the vision left eye secondary to this tiny laser applied to stop this active lesions 5

## 2020-01-03 NOTE — Progress Notes (Signed)
01/03/2020     CHIEF COMPLAINT Patient presents for Retina Follow Up   HISTORY OF PRESENT ILLNESS: Jeffery Fernandez is a 74 y.o. male who presents to the clinic today for:   HPI    Retina Follow Up    Patient presents with  CRVO/BRVO.  In left eye.  This started 4 weeks ago.  Severity is mild.  Duration of 4 weeks.  Since onset it is stable.          Comments    4 Week FFA L/R, Focal OS  Pt denies noticeable changes to [place] OU since last visit. Pt denies ocular pain, flashes of light, or floaters OU.         Last edited by Rockie Neighbours, [place] on 01/03/2020  8:05 AM. (History)      Referring physician: Lilian Coma., MD No address on file  HISTORICAL INFORMATION:   Selected notes from the MEDICAL RECORD NUMBER    Lab Results  Component Value Date   HGBA1C 6.1 (H) 01/19/2018     CURRENT MEDICATIONS: No current outpatient medications on file. (Ophthalmic Drugs)   No current facility-administered medications for this visit. (Ophthalmic Drugs)   Current Outpatient Medications (Other)  Medication Sig  . acetaminophen (TYLENOL) 500 MG tablet Take 1,000 mg by mouth every 6 (six) hours as needed (for pain.).  Marland Kitchen amLODipine (NORVASC) 2.5 MG tablet TAKE 1 TABLET BY MOUTH EVERY DAY (Patient not taking: Reported on 05/05/2019)  . amLODipine (NORVASC) 5 MG tablet Take 5 mg by mouth daily.  Marland Kitchen aspirin 325 MG EC tablet Take 325 mg by mouth daily.  Marland Kitchen atorvastatin (LIPITOR) 40 MG tablet Take 20 mg by mouth at bedtime.   . carvedilol (COREG) 25 MG tablet TAKE 1 TABLET BY MOUTH 2 TIMES DAILY WITH A MEAL.  . fluticasone (FLONASE) 50 MCG/ACT nasal spray Place 1 spray into both nostrils daily. (Patient taking differently: Place 1 spray into both nostrils daily as needed for allergies. )  . glipiZIDE (GLUCOTROL XL) 10 MG 24 hr tablet Take 10 mg by mouth 2 (two) times daily.  . hydrochlorothiazide (MICROZIDE) 12.5 MG capsule TAKE 1 CAPSULE BY MOUTH EVERY DAY  . Insulin Glargine (BASAGLAR  KWIKPEN) 100 UNIT/ML SMARTSIG:12 Unit(s) SUB-Q Daily  . lisinopril (ZESTRIL) 40 MG tablet TAKE 1 TABLET BY MOUTH EVERY DAY  . metFORMIN (GLUCOPHAGE) 500 MG tablet Take 1,000 mg by mouth 2 (two) times daily with a meal.   . pantoprazole (PROTONIX) 40 MG tablet Take 40 mg by mouth daily.  . pioglitazone (ACTOS) 30 MG tablet Take 30 mg by mouth daily.  . sertraline (ZOLOFT) 100 MG tablet Take 100 mg daily by mouth.   . sitaGLIPtan (JANUVIA) 100 MG tablet Take 100 mg by mouth daily.    Marland Kitchen terazosin (HYTRIN) 5 MG capsule TAKE 1 CAPSULE BY MOUTH EVERYDAY AT BEDTIME   No current facility-administered medications for this visit. (Other)      REVIEW OF SYSTEMS:    ALLERGIES Allergies  Allergen Reactions  . Penicillins Other (See Comments)    Possibly rash - not sure Has patient had a PCN reaction causing immediate rash, facial/tongue/throat swelling, SOB or lightheadedness with hypotension:unknown Has patient had a PCN reaction causing severe rash involving mucus membranes or skin necrosis: Unknown Has patient had a PCN reaction that required hospitalization: Unknown Has patient had a PCN reaction occurring within the last 10 years: No Childhood reaction If all of the above answers are "NO", then may  proceed with Cephalosporin use.     PAST MEDICAL HISTORY Past Medical History:  Diagnosis Date  . Arrhythmia   . Arthritis   . Cataract    bilateral cataract extraction with intraoccular lens implants  . Chest pain, unspecified   . Chronic airway obstruction, not elsewhere classified    PT. DENIES HE HAS COPD  . CKD (chronic kidney disease)   . Colon polyps    Adenomatous Polyps 2007  . Depression   . Diabetes mellitus   . Diverticulosis   . Gout   . Heart murmur   . History of kidney stones   . Hyperlipemia   . Hypertension   . Nephrolithiasis   . Normocytic anemia 04/23/2017  . Obstructive sleep apnea (adult) (pediatric)   . Other and unspecified hyperlipidemia   . Other  left bundle branch block   . Retinal vascular occlusion, unspecified    right  . Shortness of breath    on exertion  . Unspecified sleep apnea   . Vision loss of right eye   . White coat syndrome with diagnosis of hypertension 09/19/2017   Past Surgical History:  Procedure Laterality Date  . ANTERIOR CERVICAL DECOMP/DISCECTOMY FUSION N/A 06/26/2016   Procedure: Re-Exploration of Cervical Wound;  Surgeon: Kary Kos, MD;  Location: [facility];  Service: Neurosurgery;  Laterality: N/A;  . ANTERIOR CERVICAL DECOMP/DISCECTOMY FUSION N/A 06/26/2016   Procedure: ANTERIOR CERVICAL DECOMPRESSION/DISCECTOMY FUSION, INTERBODY PROSTESIS, PLATE, CERVICAL THREE CERVICAL FOUR, CERVICAL FOUR CERVICAL FIVE CERVICAL SIX;  Surgeon: Newman Pies, MD;  Location: [facility];  Service: Neurosurgery;  Laterality: N/A;  . CATARACT EXTRACTION Bilateral   . KIDNEY STONE SURGERY    . KYPHOPLASTY N/A 01/19/2018   Procedure: KYPHOPLASTY LUMBAR THREE;  Surgeon: Newman Pies, MD;  Location: [facility];  Service: Neurosurgery;  Laterality: N/A;  KYPHOPLASTY LUMBAR THREE    FAMILY HISTORY Family History  Problem Relation Age of Onset  . Heart attack Brother 60       heart attack and CHF/ also some form of EP ablation  . Parkinson's disease Brother   . Atrial fibrillation Brother   . Heart disease Father   . Stroke Father   . Uterine cancer Mother   . Arthritis/Rheumatoid Cousin   . Diabetes Other   . Colon cancer Neg Hx   . Stomach cancer Neg Hx   . Liver cancer Neg Hx   . Rectal cancer Neg Hx   . Esophageal cancer Neg Hx     SOCIAL HISTORY Social History   Tobacco Use  . Smoking status: Former Smoker    Packs/day: 1.50    Years: 25.00    Pack years: 37.50    Types: Cigarettes    Quit date: 01/28/1986    Years since quitting: 33.9  . Smokeless tobacco: Never Used  Vaping Use  . Vaping Use: Never used  Substance Use Topics  . Alcohol use: No    Comment: Very rare  . Drug use: No         OPHTHALMIC  EXAM:  Base Eye Exam    Visual Acuity (ETDRS)      Right Left   Dist cc HM 20/30 -1   Dist ph cc NI NI   Correction: Glasses       Tonometry (Tonopen, 8:05 AM)      Right Left   Pressure 12 10       Pupils      Dark Light Shape React APD   Right 3  3 Round Minimal None   Left 3 2 Round Slow None       Visual Fields (Counting fingers)      Left Right    Full    Restrictions  Partial outer superior temporal, inferior temporal, superior nasal deficiencies       Extraocular Movement      Right Left    Full Full       Neuro/Psych    Oriented x3: Yes   Mood/Affect: Normal       Dilation    Both eyes: 1.0% Mydriacyl, 2.5% Phenylephrine @ 8:08 AM          IMAGING AND PROCEDURES  Imaging and Procedures for 01/03/20  Fluorescein Angiography Optos (Transit OS)       Injection:  500 mg Fluorescein Sodium 10 % injection   NDC: 0065-0092-65   Route: Intravenous, Site: Right ArmRight Eye Mid/Late phase findings include normal observations. Choroidal neovascularization is not present.   Left Eye   Progression has worsened. Early phase findings include leakage. Mid/Late phase findings include leakage.   Notes Focal hotspot superior to the fovea left eye, this could be early RAP or shunt from the macular BRVO in any case this is new since 2 years previous and will need focal laser treatment today       Color Fundus Photography Optos - OU - Both Eyes       Right Eye Progression has been stable. Disc findings include pallor.   Left Eye Progression has worsened. Disc findings include normal observations. Macula : exudates, edema, microaneurysms.        Focal Laser - OS - Left Eye       Time Out Confirmed correct patient, procedure, site, and patient consented.   Anesthesia Topical anesthesia was used. Anesthetic medications included Proparacaine 0.5%.   Laser Information The type of laser was diode. Color was yellow. The duration in seconds was 0.1.  The spot size was 100 (Multiple lesions superiorly adjacent to the retinal arterioles, treated with 200 m spot size, 110 mW, 4 spots) microns. Laser power was 50. Total spots was 18.   Post-op The patient tolerated the procedure well. There were no complications. The patient received written and verbal post procedure care education.        OCT, Retina - OU - Both Eyes       Right Eye Quality was good. Scan locations included subfoveal. Central Foveal Thickness: 238. Progression has been stable. Findings include normal observations.   Left Eye Quality was good. Scan locations included subfoveal. Central Foveal Thickness: 288. Progression has been stable. Findings include cystoid macular edema.   Notes Stable CME superior to the fovea, noncenter involved left eye, increased at 10 weeks.  Secondary to BRVO OS.                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  Follow-up in 6 5 to 6 weeks for consideration of injection intravitreal Avastin at that time      ICD-10-CM   1. 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  2.  3.  Ophthalmic Meds Ordered this visit:  Meds ordered this encounter  Medications  . Fluorescein Sodium 10 % injection 500 mg       Return in about 5 weeks (around 02/07/2020) for dilate, OS, AVASTIN OCT.  Patient Instructions  Patient instructed to contact the office promptly for new onset visual acuities of this clients  Patient also formed that a small little dark spot might develop  inferior or below the center of the vision left eye secondary to this tiny laser applied to stop this active lesions 5    Explained the diagnoses, plan, and follow up with the patient and they expressed understanding.  Patient expressed understanding of the importance of proper follow up care.   Clent Demark Atarah Cadogan M.D. Diseases & Surgery of the Retina and Vitreous [facility] 01/03/20     Abbreviations: M myopia (nearsighted); A astigmatism; H hyperopia (farsighted); P presbyopia; Mrx spectacle prescription;  CTL contact lenses; OD right eye; OS left eye; OU both eyes  XT exotropia; ET esotropia; PEK punctate epithelial keratitis; PEE punctate epithelial erosions; DES dry eye syndrome; MGD meibomian gland dysfunction; ATs artificial tears; PFAT's preservative free artificial tears; [place] nuclear sclerotic cataract; PSC posterior subcapsular cataract; ERM epi-retinal membrane; PVD posterior vitreous detachment; RD retinal detachment; DM diabetes mellitus; DR diabetic retinopathy; NPDR non-proliferative diabetic retinopathy; PDR proliferative diabetic retinopathy; CSME clinically significant macular edema; DME diabetic macular edema; dbh dot blot hemorrhages; CWS cotton wool spot; POAG primary open angle glaucoma; C/D cup-to-disc ratio; HVF humphrey visual field; GVF goldmann visual field; OCT optical coherence tomography; IOP intraocular pressure; BRVO Branch retinal vein occlusion; CRVO central retinal vein occlusion; CRAO central retinal artery occlusion; BRAO branch retinal artery occlusion; RT retinal tear; SB scleral buckle; PPV pars plana vitrectomy; VH Vitreous hemorrhage; PRP panretinal laser photocoagulation; IVK intravitreal kenalog; VMT vitreomacular traction; MH Macular hole;  NVD neovascularization of the disc; NVE neovascularization elsewhere; AREDS age related eye disease study; ARMD age related macular degeneration; POAG primary open angle glaucoma; EBMD epithelial/anterior  basement membrane dystrophy; ACIOL anterior chamber intraocular lens; IOL intraocular lens; PCIOL posterior chamber intraocular lens; Phaco/IOL phacoemulsification with intraocular lens placement; [place] photorefractive keratectomy; LASIK laser assisted in situ keratomileusis; HTN hypertension; DM diabetes mellitus; COPD chronic obstructive pulmonary disease

## 2020-01-03 NOTE — Assessment & Plan Note (Signed)
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  Follow-up in 6 5 to 6 weeks for consideration of injection intravitreal Avastin at that time

## 2020-02-07 ENCOUNTER — Encounter (INDEPENDENT_AMBULATORY_CARE_PROVIDER_SITE_OTHER): Payer: Medicare HMO | Admitting: Ophthalmology

## 2020-02-09 ENCOUNTER — Other Ambulatory Visit: Payer: Self-pay

## 2020-02-09 ENCOUNTER — Encounter (INDEPENDENT_AMBULATORY_CARE_PROVIDER_SITE_OTHER): Payer: Self-pay | Admitting: Ophthalmology

## 2020-02-09 ENCOUNTER — Ambulatory Visit (INDEPENDENT_AMBULATORY_CARE_PROVIDER_SITE_OTHER): Payer: Medicare HMO | Admitting: Ophthalmology

## 2020-02-09 DIAGNOSIS — H34832 Tributary (branch) retinal vein occlusion, left eye, with macular edema: Secondary | ICD-10-CM | POA: Diagnosis not present

## 2020-02-09 MED ORDER — BEVACIZUMAB 2.5 MG/0.1ML IZ SOSY
2.5000 mg | PREFILLED_SYRINGE | INTRAVITREAL | Status: AC | PRN
  Administered 2020-02-09: 2.5 mg via INTRAVITREAL

## 2020-02-09 NOTE — Assessment & Plan Note (Signed)
1 month post focal laser left eye,, will repeat injection Avastin to maintain as the final biologic effects of focal laser develop over the coming weeks.

## 2020-02-09 NOTE — Progress Notes (Signed)
02/09/2020     CHIEF COMPLAINT Patient presents for Retina Follow Up (5 Week BRVO f\u OS. Possible Avastin OS. OCT/Pt c/o having trouble with near vision. Pt states it looks like an "overcast" over OS vision.)   HISTORY OF PRESENT ILLNESS: Jeffery Fernandez is a 75 y.o. male who presents to the clinic today for:   HPI    Retina Follow Up    Patient presents with  CRVO/BRVO.  In left eye.  Severity is moderate.  Duration of 5 weeks.  Since onset it is stable.  I, the attending physician,  performed the HPI with the patient and updated documentation appropriately. Additional comments: 5 Week BRVO f\u OS. Possible Avastin OS. OCT Pt c/o having trouble with near vision. Pt states it looks like an "overcast" over OS vision.       Last edited by Tilda Franco on 02/09/2020  9:26 AM. (History)      Referring physician: Lilian Coma., MD No address on file  HISTORICAL INFORMATION:   Selected notes from the MEDICAL RECORD NUMBER    Lab Results  Component Value Date   HGBA1C 6.1 (H) 01/19/2018     CURRENT MEDICATIONS: No current outpatient medications on file. (Ophthalmic Drugs)   No current facility-administered medications for this visit. (Ophthalmic Drugs)   Current Outpatient Medications (Other)  Medication Sig  . acetaminophen (TYLENOL) 500 MG tablet Take 1,000 mg by mouth every 6 (six) hours as needed (for pain.).  Marland Kitchen amLODipine (NORVASC) 2.5 MG tablet TAKE 1 TABLET BY MOUTH EVERY DAY (Patient not taking: Reported on 05/05/2019)  . amLODipine (NORVASC) 5 MG tablet Take 5 mg by mouth daily.  Marland Kitchen aspirin 325 MG EC tablet Take 325 mg by mouth daily.  Marland Kitchen atorvastatin (LIPITOR) 40 MG tablet Take 20 mg by mouth at bedtime.   . carvedilol (COREG) 25 MG tablet TAKE 1 TABLET BY MOUTH 2 TIMES DAILY WITH A MEAL.  . fluticasone (FLONASE) 50 MCG/ACT nasal spray Place 1 spray into both nostrils daily. (Patient taking differently: Place 1 spray into both nostrils daily as needed for  allergies. )  . glipiZIDE (GLUCOTROL XL) 10 MG 24 hr tablet Take 10 mg by mouth 2 (two) times daily.  . hydrochlorothiazide (MICROZIDE) 12.5 MG capsule TAKE 1 CAPSULE BY MOUTH EVERY DAY  . Insulin Glargine (BASAGLAR KWIKPEN) 100 UNIT/ML SMARTSIG:12 Unit(s) SUB-Q Daily  . lisinopril (ZESTRIL) 40 MG tablet TAKE 1 TABLET BY MOUTH EVERY DAY  . metFORMIN (GLUCOPHAGE) 500 MG tablet Take 1,000 mg by mouth 2 (two) times daily with a meal.   . pantoprazole (PROTONIX) 40 MG tablet Take 40 mg by mouth daily.  . pioglitazone (ACTOS) 30 MG tablet Take 30 mg by mouth daily.  . sertraline (ZOLOFT) 100 MG tablet Take 100 mg daily by mouth.   . sitaGLIPtan (JANUVIA) 100 MG tablet Take 100 mg by mouth daily.    Marland Kitchen terazosin (HYTRIN) 5 MG capsule TAKE 1 CAPSULE BY MOUTH EVERYDAY AT BEDTIME   No current facility-administered medications for this visit. (Other)      REVIEW OF SYSTEMS:    ALLERGIES Allergies  Allergen Reactions  . Penicillins Other (See Comments)    Possibly rash - not sure Has patient had a PCN reaction causing immediate rash, facial/tongue/throat swelling, SOB or lightheadedness with hypotension:unknown Has patient had a PCN reaction causing severe rash involving mucus membranes or skin necrosis: Unknown Has patient had a PCN reaction that required hospitalization: Unknown Has patient had a  PCN reaction occurring within the last 10 years: No Childhood reaction If all of the above answers are "NO", then may proceed with Cephalosporin use.     PAST MEDICAL HISTORY Past Medical History:  Diagnosis Date  . Arrhythmia   . Arthritis   . Cataract    bilateral cataract extraction with intraoccular lens implants  . Chest pain, unspecified   . Chronic airway obstruction, not elsewhere classified    PT. DENIES HE HAS COPD  . CKD (chronic kidney disease)   . Colon polyps    Adenomatous Polyps 2007  . Depression   . Diabetes mellitus   . Diverticulosis   . Gout   . Heart murmur    . History of kidney stones   . Hyperlipemia   . Hypertension   . Nephrolithiasis   . Normocytic anemia 04/23/2017  . Obstructive sleep apnea (adult) (pediatric)   . Other and unspecified hyperlipidemia   . Other left bundle branch block   . Retinal vascular occlusion, unspecified    right  . Shortness of breath    on exertion  . Unspecified sleep apnea   . Vision loss of right eye   . White coat syndrome with diagnosis of hypertension 09/19/2017   Past Surgical History:  Procedure Laterality Date  . ANTERIOR CERVICAL DECOMP/DISCECTOMY FUSION N/A 06/26/2016   Procedure: Re-Exploration of Cervical Wound;  Surgeon: Kary Kos, MD;  Location: [place];  Service: Neurosurgery;  Laterality: N/A;  . ANTERIOR CERVICAL DECOMP/DISCECTOMY FUSION N/A 06/26/2016   Procedure: ANTERIOR CERVICAL DECOMPRESSION/DISCECTOMY FUSION, INTERBODY PROSTESIS, PLATE, CERVICAL THREE CERVICAL FOUR, CERVICAL FOUR CERVICAL FIVE CERVICAL SIX;  Surgeon: Newman Pies, MD;  Location: [place];  Service: Neurosurgery;  Laterality: N/A;  . CATARACT EXTRACTION Bilateral   . KIDNEY STONE SURGERY    . KYPHOPLASTY N/A 01/19/2018   Procedure: KYPHOPLASTY LUMBAR THREE;  Surgeon: Newman Pies, MD;  Location: [place];  Service: Neurosurgery;  Laterality: N/A;  KYPHOPLASTY LUMBAR THREE    FAMILY HISTORY Family History  Problem Relation Age of Onset  . Heart attack Brother 60       heart attack and CHF/ also some form of EP ablation  . Parkinson's disease Brother   . Atrial fibrillation Brother   . Heart disease Father   . Stroke Father   . Uterine cancer Mother   . Arthritis/Rheumatoid Cousin   . Diabetes Other   . Colon cancer Neg Hx   . Stomach cancer Neg Hx   . Liver cancer Neg Hx   . Rectal cancer Neg Hx   . Esophageal cancer Neg Hx     SOCIAL HISTORY Social History   Tobacco Use  . Smoking status: Former Smoker    Packs/day: 1.50    Years: 25.00    Pack years: 37.50    Types: Cigarettes    Quit date:  01/28/1986    Years since quitting: 34.0  . Smokeless tobacco: Never Used  Vaping Use  . Vaping Use: Never used  Substance Use Topics  . Alcohol use: No    Comment: Very rare  . Drug use: No         OPHTHALMIC EXAM:  Base Eye Exam    Visual Acuity (Snellen - Linear)      Right Left   Dist cc CF @ >1' 20/40 -2   Dist ph cc  NI   Correction: Glasses       Tonometry (Tonopen, 9:30 AM)      Right Left  Pressure 13 10       Pupils      Pupils Dark Light Shape React APD   Right PERRL 3 3 Round Minimal None   Left PERRL 3 3 Round Minimal None       Visual Fields (Counting fingers)      Left Right    Full    Restrictions  Partial outer superior nasal, inferior nasal deficiencies       Neuro/Psych    Oriented x3: Yes   Mood/Affect: Normal       Dilation    Left eye: 1.0% Mydriacyl, 2.5% Phenylephrine @ 9:30 AM        Slit Lamp and Fundus Exam    External Exam      Right Left   External Normal Normal       Slit Lamp Exam      Right Left   Lids/Lashes Normal Normal   Conjunctiva/Sclera White and quiet White and quiet   Cornea Clear Clear   Anterior Chamber Deep and quiet Deep and quiet   Iris Round and reactive Round and reactive   Lens Posterior chamber intraocular lens Posterior chamber intraocular lens   Anterior Vitreous Normal Normal       Fundus Exam      Right Left   Posterior Vitreous  Posterior vitreous detachment, Central vitreous floaters   Disc  Normal   C/D Ratio  0.4   Macula  Microaneurysms, Macular thickening,, milld CME, Cystoid macular edema superior to the FAZ,, FROM MACULAR BRVO   Vessels   BRVO,,, superior   Periphery  Normal          IMAGING AND PROCEDURES  Imaging and Procedures for 02/09/20  OCT, Retina - OU - Both Eyes       Right Eye Quality was good. Scan locations included subfoveal. Central Foveal Thickness: 235. Progression has been stable. Findings include normal foveal contour.   Left Eye Quality was good.  Scan locations included subfoveal. Central Foveal Thickness: 314. Progression has been stable. Findings include abnormal foveal contour, cystoid macular edema.   Notes CME nasal and superonasal to the fovea left eye, overall stable yet still active, 1 month post focal laser treatment will continue Avastin today as the final biologic effects of laser photocoagulation develop       Intravitreal Injection, Pharmacologic Agent - OS - Left Eye       Time Out 02/09/2020. 10:33 AM. Confirmed correct patient, procedure, site, and patient consented.   Anesthesia Topical anesthesia was used. Anesthetic medications included Akten 3.5%.   Procedure Preparation included Ofloxacin , Tobramycin 0.3%, 10% betadine to eyelids, 5% betadine to ocular surface. A 30 gauge needle was used.   Injection:  2.5 mg Bevacizumab (AVASTIN) 2.5mg /0.64mL SOSY   NDC: 14970-263-78, Lot: [id number]   Route: Intravitreal, Site: Left Eye  Post-op Post injection exam found visual acuity of at least counting fingers. The patient tolerated the procedure well. There were no complications. The patient received written and verbal post procedure care education. Post injection medications were not given.                 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      ICD-10-CM   1. 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  Focal laser treatment completed 1 month previous OS    2.  Vitreal Avastin OS today  3.  Ophthalmic Meds Ordered this visit:  Meds ordered this encounter  Medications  . bevacizumab (AVASTIN) SOSY 2.5 mg       Return in about 6 weeks  (around 03/22/2020) for dilate, OS, AVASTIN OCT.  There are no Patient Instructions on file for this visit.   Explained the diagnoses, plan, and follow up with the patient and they expressed understanding.  Patient expressed understanding of the importance of proper follow up care.   Clent Demark Victoire Deans M.D. Diseases & Surgery of the Retina and Vitreous Retina & Diabetic [place] 02/09/20     Abbreviations: M myopia (nearsighted); A astigmatism; H hyperopia (farsighted); P presbyopia; Mrx spectacle prescription;  CTL contact lenses; OD right eye; OS left eye; OU both eyes  XT exotropia; ET esotropia; PEK punctate epithelial keratitis; PEE punctate epithelial erosions; DES dry eye syndrome; MGD meibomian gland dysfunction; ATs artificial tears; PFAT's preservative free artificial tears; [place] nuclear sclerotic cataract; PSC posterior subcapsular cataract; ERM epi-retinal membrane; PVD posterior vitreous detachment; RD retinal detachment; DM diabetes mellitus; DR diabetic retinopathy; NPDR non-proliferative diabetic retinopathy; PDR proliferative diabetic retinopathy; CSME clinically significant macular edema; DME diabetic macular edema; dbh dot blot hemorrhages; CWS cotton wool spot; POAG primary open angle glaucoma; C/D cup-to-disc ratio; HVF humphrey visual field; GVF goldmann visual field; OCT optical coherence tomography; IOP intraocular pressure; BRVO Branch retinal vein occlusion; CRVO central retinal vein occlusion; CRAO central retinal artery occlusion; BRAO branch retinal artery occlusion; RT retinal tear; SB scleral buckle; PPV pars plana vitrectomy; VH Vitreous hemorrhage; PRP panretinal laser photocoagulation; IVK intravitreal kenalog; VMT vitreomacular traction; MH Macular hole;  NVD neovascularization of the disc; NVE neovascularization elsewhere; AREDS age related eye disease study; ARMD age related macular degeneration; POAG primary open angle glaucoma; EBMD epithelial/anterior basement  membrane dystrophy; ACIOL anterior chamber intraocular lens; IOL intraocular lens; PCIOL posterior chamber intraocular lens; Phaco/IOL phacoemulsification with intraocular lens placement; [place] photorefractive keratectomy; LASIK laser assisted in situ keratomileusis; HTN hypertension; DM diabetes mellitus; COPD chronic obstructive pulmonary disease

## 2020-03-22 ENCOUNTER — Other Ambulatory Visit: Payer: Self-pay

## 2020-03-22 ENCOUNTER — Encounter (INDEPENDENT_AMBULATORY_CARE_PROVIDER_SITE_OTHER): Payer: Self-pay | Admitting: Ophthalmology

## 2020-03-22 ENCOUNTER — Ambulatory Visit (INDEPENDENT_AMBULATORY_CARE_PROVIDER_SITE_OTHER): Payer: Medicare HMO | Admitting: Ophthalmology

## 2020-03-22 DIAGNOSIS — H34832 Tributary (branch) retinal vein occlusion, left eye, with macular edema: Secondary | ICD-10-CM

## 2020-03-22 MED ORDER — BEVACIZUMAB 2.5 MG/0.1ML IZ SOSY
2.5000 mg | PREFILLED_SYRINGE | INTRAVITREAL | Status: AC | PRN
  Administered 2020-03-22: 2.5 mg via INTRAVITREAL

## 2020-03-22 NOTE — Progress Notes (Signed)
03/22/2020     CHIEF COMPLAINT Patient presents for Retina Follow Up (6 Week F/U OS, poss Avastin OS//Pt denies noticeable changes to [place] OU since last visit. Pt denies ocular pain, flashes of light, or floaters OU. //)   HISTORY OF PRESENT ILLNESS: Jeffery Fernandez is a 75 y.o. male who presents to the clinic today for:   HPI    Retina Follow Up    Patient presents with  CRVO/BRVO.  In left eye.  This started 6 weeks ago.  Severity is mild.  Duration of 6 weeks.  Since onset it is stable. Additional comments: 6 Week F/U OS, poss Avastin OS  Pt denies noticeable changes to [place] OU since last visit. Pt denies ocular pain, flashes of light, or floaters OU.          Last edited by Rockie Neighbours, [place] on 03/22/2020  9:28 AM. (History)      Referring physician: Lilian Coma., MD No address on file  HISTORICAL INFORMATION:   Selected notes from the MEDICAL RECORD NUMBER    Lab Results  Component Value Date   HGBA1C 6.1 (H) 01/19/2018     CURRENT MEDICATIONS: No current outpatient medications on file. (Ophthalmic Drugs)   No current facility-administered medications for this visit. (Ophthalmic Drugs)   Current Outpatient Medications (Other)  Medication Sig  . acetaminophen (TYLENOL) 500 MG tablet Take 1,000 mg by mouth every 6 (six) hours as needed (for pain.).  Marland Kitchen amLODipine (NORVASC) 2.5 MG tablet TAKE 1 TABLET BY MOUTH EVERY DAY (Patient not taking: Reported on 05/05/2019)  . amLODipine (NORVASC) 5 MG tablet Take 5 mg by mouth daily.  Marland Kitchen aspirin 325 MG EC tablet Take 325 mg by mouth daily.  Marland Kitchen atorvastatin (LIPITOR) 40 MG tablet Take 20 mg by mouth at bedtime.   . carvedilol (COREG) 25 MG tablet TAKE 1 TABLET BY MOUTH 2 TIMES DAILY WITH A MEAL.  . fluticasone (FLONASE) 50 MCG/ACT nasal spray Place 1 spray into both nostrils daily. (Patient taking differently: Place 1 spray into both nostrils daily as needed for allergies. )  . glipiZIDE (GLUCOTROL XL) 10 MG 24 hr tablet Take 10  mg by mouth 2 (two) times daily.  . hydrochlorothiazide (MICROZIDE) 12.5 MG capsule TAKE 1 CAPSULE BY MOUTH EVERY DAY  . Insulin Glargine (BASAGLAR KWIKPEN) 100 UNIT/ML SMARTSIG:12 Unit(s) SUB-Q Daily  . lisinopril (ZESTRIL) 40 MG tablet TAKE 1 TABLET BY MOUTH EVERY DAY  . metFORMIN (GLUCOPHAGE) 500 MG tablet Take 1,000 mg by mouth 2 (two) times daily with a meal.   . pantoprazole (PROTONIX) 40 MG tablet Take 40 mg by mouth daily.  . pioglitazone (ACTOS) 30 MG tablet Take 30 mg by mouth daily.  . sertraline (ZOLOFT) 100 MG tablet Take 100 mg daily by mouth.   . sitaGLIPtan (JANUVIA) 100 MG tablet Take 100 mg by mouth daily.    Marland Kitchen terazosin (HYTRIN) 5 MG capsule TAKE 1 CAPSULE BY MOUTH EVERYDAY AT BEDTIME   No current facility-administered medications for this visit. (Other)      REVIEW OF SYSTEMS:    ALLERGIES Allergies  Allergen Reactions  . Penicillins Other (See Comments)    Possibly rash - not sure Has patient had a PCN reaction causing immediate rash, facial/tongue/throat swelling, SOB or lightheadedness with hypotension:unknown Has patient had a PCN reaction causing severe rash involving mucus membranes or skin necrosis: Unknown Has patient had a PCN reaction that required hospitalization: Unknown Has patient had a PCN reaction occurring  within the last 10 years: No Childhood reaction If all of the above answers are "NO", then may proceed with Cephalosporin use.     PAST MEDICAL HISTORY Past Medical History:  Diagnosis Date  . Arrhythmia   . Arthritis   . Cataract    bilateral cataract extraction with intraoccular lens implants  . Chest pain, unspecified   . Chronic airway obstruction, not elsewhere classified    PT. DENIES HE HAS COPD  . CKD (chronic kidney disease)   . Colon polyps    Adenomatous Polyps 2007  . Depression   . Diabetes mellitus   . Diverticulosis   . Gout   . Heart murmur   . History of kidney stones   . Hyperlipemia   . Hypertension   .  Nephrolithiasis   . Normocytic anemia 04/23/2017  . Obstructive sleep apnea (adult) (pediatric)   . Other and unspecified hyperlipidemia   . Other left bundle branch block   . Retinal vascular occlusion, unspecified    right  . Shortness of breath    on exertion  . Unspecified sleep apnea   . Vision loss of right eye   . White coat syndrome with diagnosis of hypertension 09/19/2017   Past Surgical History:  Procedure Laterality Date  . ANTERIOR CERVICAL DECOMP/DISCECTOMY FUSION N/A 06/26/2016   Procedure: Re-Exploration of Cervical Wound;  Surgeon: Kary Kos, MD;  Location: [place];  Service: Neurosurgery;  Laterality: N/A;  . ANTERIOR CERVICAL DECOMP/DISCECTOMY FUSION N/A 06/26/2016   Procedure: ANTERIOR CERVICAL DECOMPRESSION/DISCECTOMY FUSION, INTERBODY PROSTESIS, PLATE, CERVICAL THREE CERVICAL FOUR, CERVICAL FOUR CERVICAL FIVE CERVICAL SIX;  Surgeon: Newman Pies, MD;  Location: [place];  Service: Neurosurgery;  Laterality: N/A;  . CATARACT EXTRACTION Bilateral   . KIDNEY STONE SURGERY    . KYPHOPLASTY N/A 01/19/2018   Procedure: KYPHOPLASTY LUMBAR THREE;  Surgeon: Newman Pies, MD;  Location: [place];  Service: Neurosurgery;  Laterality: N/A;  KYPHOPLASTY LUMBAR THREE    FAMILY HISTORY Family History  Problem Relation Age of Onset  . Heart attack Brother 60       heart attack and CHF/ also some form of EP ablation  . Parkinson's disease Brother   . Atrial fibrillation Brother   . Heart disease Father   . Stroke Father   . Uterine cancer Mother   . Arthritis/Rheumatoid Cousin   . Diabetes Other   . Colon cancer Neg Hx   . Stomach cancer Neg Hx   . Liver cancer Neg Hx   . Rectal cancer Neg Hx   . Esophageal cancer Neg Hx     SOCIAL HISTORY Social History   Tobacco Use  . Smoking status: Former Smoker    Packs/day: 1.50    Years: 25.00    Pack years: 37.50    Types: Cigarettes    Quit date: 01/28/1986    Years since quitting: 34.1  . Smokeless tobacco: Never Used   Vaping Use  . Vaping Use: Never used  Substance Use Topics  . Alcohol use: No    Comment: Very rare  . Drug use: No         OPHTHALMIC EXAM: Base Eye Exam    Visual Acuity (ETDRS)      Right Left   Dist cc CF @ 2' 20/50 +2   Dist ph cc NI NI   Correction: Glasses       Tonometry (Tonopen, 9:29 AM)      Right Left   Pressure 09 08  Pupils      Pupils Dark Light Shape React APD   Right PERRL 4 4 Round Minimal None   Left PERRL 4 4 Round Minimal None       Visual Fields (Counting fingers)      Left Right    Full    Restrictions  Partial outer superior nasal, inferior nasal deficiencies       Extraocular Movement      Right Left    Full Full       Neuro/Psych    Oriented x3: Yes   Mood/Affect: Normal       Dilation    Left eye: 1.0% Mydriacyl, 2.5% Phenylephrine @ 9:32 AM        Slit Lamp and Fundus Exam    External Exam      Right Left   External Normal Normal       Slit Lamp Exam      Right Left   Lids/Lashes Normal Normal   Conjunctiva/Sclera White and quiet White and quiet   Cornea Clear Clear   Anterior Chamber Deep and quiet Deep and quiet   Iris Round and reactive Round and reactive   Lens Posterior chamber intraocular lens Posterior chamber intraocular lens   Anterior Vitreous Normal Normal       Fundus Exam      Right Left   Posterior Vitreous  Posterior vitreous detachment, Central vitreous floaters   Disc  Normal   C/D Ratio  0.4   Macula  Microaneurysms, Macular thickening,, milld CME, Cystoid macular edema superior to the FAZ,, FROM MACULAR BRVO   Vessels   BRVO,,, superior   Periphery  Normal          IMAGING AND PROCEDURES  Imaging and Procedures for 03/22/20  OCT, Retina - OU - Both Eyes       Right Eye Quality was good. Scan locations included subfoveal. Central Foveal Thickness: 234. Progression has been stable. Findings include normal foveal contour.   Left Eye Quality was good. Scan locations included  subfoveal. Central Foveal Thickness: 311. Progression has been stable. Findings include abnormal foveal contour, cystoid macular edema.   Notes CME nasal and superonasal to the fovea left eye, overall stable yet still active,post focal laser treatment will continue Avastin today as the final biologic effects of laser photocoagulation develop  And now for 6 weeks post intravitreal Avastin, will repeat injection today for controlled CME secondary to BRVO       Intravitreal Injection, Pharmacologic Agent - OS - Left Eye       Time Out 03/22/2020. 10:40 AM. Confirmed correct patient, procedure, site, and patient consented.   Anesthesia Topical anesthesia was used. Anesthetic medications included Akten 3.5%.   Procedure Preparation included Ofloxacin , Tobramycin 0.3%, 10% betadine to eyelids, 5% betadine to ocular surface. A 30 gauge needle was used.   Injection:  2.5 mg Bevacizumab (AVASTIN) 2.5mg /0.74mL SOSY   NDC: 91478-295-62, Lot: [id number]   Route: Intravitreal, Site: Left Eye  Post-op Post injection exam found visual acuity of at least counting fingers. The patient tolerated the procedure well. There were no complications. The patient received written and verbal post procedure care education. Post injection medications were not given.                 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  Controlled CME.  We  will repeat injection today as full effect biologic of the laser continues to take hold OS  Today at 6 weeks post injection Avastin      ICD-10-CM   1. 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  Persistent CME from BRVO OS, 6 weeks post recent Avastin in 2 months post focal.   Repeat injection today and examination again in 6 weeks  2.  3.  Ophthalmic Meds Ordered this visit:  Meds ordered this encounter  Medications  . bevacizumab (AVASTIN) SOSY 2.5 mg       Return in about 6 weeks (around 05/03/2020) for dilate, OS, AVASTIN OCT.  There are no Patient Instructions on file for this visit.   Explained the diagnoses, plan, and follow up with the patient and they expressed understanding.  Patient expressed understanding of the importance of proper follow up care.   Clent Demark Rhone Ozaki M.D. Diseases & Surgery of the Retina and Vitreous Retina & Diabetic [place] 03/22/20     Abbreviations: M myopia (nearsighted); A astigmatism; H hyperopia (farsighted); P presbyopia; Mrx spectacle prescription;  CTL contact lenses; OD right eye; OS left eye; OU both eyes  XT exotropia; ET esotropia; PEK punctate epithelial keratitis; PEE punctate epithelial erosions; DES dry eye syndrome; MGD meibomian gland dysfunction; ATs artificial tears; PFAT's preservative free artificial tears; [place] nuclear sclerotic cataract; PSC posterior subcapsular cataract; ERM epi-retinal membrane; PVD posterior vitreous detachment; RD retinal detachment; DM diabetes mellitus; DR diabetic retinopathy; NPDR non-proliferative diabetic retinopathy; PDR proliferative diabetic retinopathy; CSME clinically significant macular edema; DME diabetic macular edema; dbh dot blot hemorrhages; CWS cotton wool spot; POAG primary open angle glaucoma; C/D cup-to-disc ratio; HVF humphrey visual field; GVF goldmann visual field; OCT optical coherence tomography; IOP intraocular pressure; BRVO Branch retinal vein occlusion; CRVO central retinal vein occlusion; CRAO central retinal artery occlusion; BRAO branch retinal artery occlusion; RT retinal tear; SB scleral buckle; PPV pars plana vitrectomy; VH Vitreous hemorrhage; PRP panretinal laser photocoagulation; IVK intravitreal kenalog; VMT vitreomacular traction; MH Macular  hole;  NVD neovascularization of the disc; NVE neovascularization elsewhere; AREDS age related eye disease study; ARMD age related macular degeneration; POAG primary open angle glaucoma; EBMD epithelial/anterior basement membrane dystrophy; ACIOL anterior chamber intraocular lens; IOL intraocular lens; PCIOL posterior chamber intraocular lens; Phaco/IOL phacoemulsification with intraocular lens placement; [place] photorefractive keratectomy; LASIK laser assisted in situ keratomileusis; HTN hypertension; DM diabetes mellitus; COPD chronic obstructive pulmonary disease

## 2020-03-22 NOTE — Assessment & Plan Note (Signed)
OS, now 2 months post focal laser treatment for extra foveal portions of leakage.  Controlled CME.  We will repeat injection today as full effect biologic of the laser continues to take hold OS  Today at 6 weeks post injection Avastin

## 2020-04-27 ENCOUNTER — Other Ambulatory Visit: Payer: Self-pay | Admitting: Student

## 2020-04-27 DIAGNOSIS — S32040A Wedge compression fracture of fourth lumbar vertebra, initial encounter for closed fracture: Secondary | ICD-10-CM

## 2020-04-28 ENCOUNTER — Ambulatory Visit
Admission: RE | Admit: 2020-04-28 | Discharge: 2020-04-28 | Disposition: A | Discharge status: Home / Self Care | Payer: Medicare HMO | Source: Ambulatory Visit | Attending: Student | Admitting: Student

## 2020-04-28 DIAGNOSIS — S32040A Wedge compression fracture of fourth lumbar vertebra, initial encounter for closed fracture: Secondary | ICD-10-CM

## 2020-05-03 ENCOUNTER — Encounter (INDEPENDENT_AMBULATORY_CARE_PROVIDER_SITE_OTHER): Payer: Self-pay | Admitting: Ophthalmology

## 2020-05-03 ENCOUNTER — Ambulatory Visit (INDEPENDENT_AMBULATORY_CARE_PROVIDER_SITE_OTHER): Payer: Medicare HMO | Admitting: Ophthalmology

## 2020-05-03 ENCOUNTER — Other Ambulatory Visit: Payer: Self-pay

## 2020-05-03 DIAGNOSIS — H34832 Tributary (branch) retinal vein occlusion, left eye, with macular edema: Secondary | ICD-10-CM

## 2020-05-03 MED ORDER — BEVACIZUMAB 2.5 MG/0.1ML IZ SOSY
2.5000 mg | PREFILLED_SYRINGE | INTRAVITREAL | Status: AC | PRN
  Administered 2020-05-03: 2.5 mg via INTRAVITREAL

## 2020-05-03 NOTE — Assessment & Plan Note (Signed)
CSME OS persists yet center involvement is protected with use of intravitreal Avastin.  We will repeat injection today at 6-week interval examination next in 8 to 9-week

## 2020-05-03 NOTE — Progress Notes (Signed)
05/03/2020     CHIEF COMPLAINT Patient presents for Retina Follow Up (6 Week BRVO f\u OS. Possible avastin OS. OCT/Pt states DVA and NVA is blurry. Pt states vision looks hazy.)   HISTORY OF PRESENT ILLNESS: Jeffery Fernandez is a 75 y.o. male who presents to the clinic today for:   HPI    Retina Follow Up    Patient presents with  CRVO/BRVO.  In left eye.  Severity is moderate.  Duration of 6 weeks.  Since onset it is stable. Additional comments: 6 Week BRVO f\u OS. Possible avastin OS. OCT Pt states DVA and NVA is blurry. Pt states vision looks hazy.       Last edited by Tilda Franco on 05/03/2020  9:57 AM. (History)      Referring physician: Lilian Coma., MD No address on file  HISTORICAL INFORMATION:   Selected notes from the MEDICAL RECORD NUMBER    Lab Results  Component Value Date   HGBA1C 6.1 (H) 01/19/2018     CURRENT MEDICATIONS: No current outpatient medications on file. (Ophthalmic Drugs)   No current facility-administered medications for this visit. (Ophthalmic Drugs)   Current Outpatient Medications (Other)  Medication Sig  . acetaminophen (TYLENOL) 500 MG tablet Take 1,000 mg by mouth every 6 (six) hours as needed (for pain.).  Marland Kitchen amLODipine (NORVASC) 2.5 MG tablet TAKE 1 TABLET BY MOUTH EVERY DAY (Patient not taking: Reported on 05/05/2019)  . amLODipine (NORVASC) 5 MG tablet Take 5 mg by mouth daily.  Marland Kitchen aspirin 325 MG EC tablet Take 325 mg by mouth daily.  Marland Kitchen atorvastatin (LIPITOR) 40 MG tablet Take 20 mg by mouth at bedtime.   . carvedilol (COREG) 25 MG tablet TAKE 1 TABLET BY MOUTH 2 TIMES DAILY WITH A MEAL.  . fluticasone (FLONASE) 50 MCG/ACT nasal spray Place 1 spray into both nostrils daily. (Patient taking differently: Place 1 spray into both nostrils daily as needed for allergies. )  . glipiZIDE (GLUCOTROL XL) 10 MG 24 hr tablet Take 10 mg by mouth 2 (two) times daily.  . hydrochlorothiazide (MICROZIDE) 12.5 MG capsule TAKE 1 CAPSULE BY MOUTH  EVERY DAY  . Insulin Glargine (BASAGLAR KWIKPEN) 100 UNIT/ML SMARTSIG:12 Unit(s) SUB-Q Daily  . lisinopril (ZESTRIL) 40 MG tablet TAKE 1 TABLET BY MOUTH EVERY DAY  . metFORMIN (GLUCOPHAGE) 500 MG tablet Take 1,000 mg by mouth 2 (two) times daily with a meal.   . pantoprazole (PROTONIX) 40 MG tablet Take 40 mg by mouth daily.  . pioglitazone (ACTOS) 30 MG tablet Take 30 mg by mouth daily.  . sertraline (ZOLOFT) 100 MG tablet Take 100 mg daily by mouth.   . sitaGLIPtan (JANUVIA) 100 MG tablet Take 100 mg by mouth daily.    Marland Kitchen terazosin (HYTRIN) 5 MG capsule TAKE 1 CAPSULE BY MOUTH EVERYDAY AT BEDTIME   No current facility-administered medications for this visit. (Other)      REVIEW OF SYSTEMS:    ALLERGIES Allergies  Allergen Reactions  . Penicillins Other (See Comments)    Possibly rash - not sure Has patient had a PCN reaction causing immediate rash, facial/tongue/throat swelling, SOB or lightheadedness with hypotension:unknown Has patient had a PCN reaction causing severe rash involving mucus membranes or skin necrosis: Unknown Has patient had a PCN reaction that required hospitalization: Unknown Has patient had a PCN reaction occurring within the last 10 years: No Childhood reaction If all of the above answers are "NO", then may proceed with Cephalosporin use.  PAST MEDICAL HISTORY Past Medical History:  Diagnosis Date  . Arrhythmia   . Arthritis   . Cataract    bilateral cataract extraction with intraoccular lens implants  . Chest pain, unspecified   . Chronic airway obstruction, not elsewhere classified    PT. DENIES HE HAS COPD  . CKD (chronic kidney disease)   . Colon polyps    Adenomatous Polyps 2007  . Depression   . Diabetes mellitus   . Diverticulosis   . Gout   . Heart murmur   . History of kidney stones   . Hyperlipemia   . Hypertension   . Nephrolithiasis   . Normocytic anemia 04/23/2017  . Obstructive sleep apnea (adult) (pediatric)   . Other  and unspecified hyperlipidemia   . Other left bundle branch block   . Retinal vascular occlusion, unspecified    right  . Shortness of breath    on exertion  . Unspecified sleep apnea   . Vision loss of right eye   . White coat syndrome with diagnosis of hypertension 09/19/2017   Past Surgical History:  Procedure Laterality Date  . ANTERIOR CERVICAL DECOMP/DISCECTOMY FUSION N/A 06/26/2016   Procedure: Re-Exploration of Cervical Wound;  Surgeon: Kary Kos, MD;  Location: [facility];  Service: Neurosurgery;  Laterality: N/A;  . ANTERIOR CERVICAL DECOMP/DISCECTOMY FUSION N/A 06/26/2016   Procedure: ANTERIOR CERVICAL DECOMPRESSION/DISCECTOMY FUSION, INTERBODY PROSTESIS, PLATE, CERVICAL THREE CERVICAL FOUR, CERVICAL FOUR CERVICAL FIVE CERVICAL SIX;  Surgeon: Newman Pies, MD;  Location: [facility];  Service: Neurosurgery;  Laterality: N/A;  . CATARACT EXTRACTION Bilateral   . KIDNEY STONE SURGERY    . KYPHOPLASTY N/A 01/19/2018   Procedure: KYPHOPLASTY LUMBAR THREE;  Surgeon: Newman Pies, MD;  Location: [facility];  Service: Neurosurgery;  Laterality: N/A;  KYPHOPLASTY LUMBAR THREE    FAMILY HISTORY Family History  Problem Relation Age of Onset  . Heart attack Brother 60       heart attack and CHF/ also some form of EP ablation  . Parkinson's disease Brother   . Atrial fibrillation Brother   . Heart disease Father   . Stroke Father   . Uterine cancer Mother   . Arthritis/Rheumatoid Cousin   . Diabetes Other   . Colon cancer Neg Hx   . Stomach cancer Neg Hx   . Liver cancer Neg Hx   . Rectal cancer Neg Hx   . Esophageal cancer Neg Hx     SOCIAL HISTORY Social History   Tobacco Use  . Smoking status: Former Smoker    Packs/day: 1.50    Years: 25.00    Pack years: 37.50    Types: Cigarettes    Quit date: 01/28/1986    Years since quitting: 34.2  . Smokeless tobacco: Never Used  Vaping Use  . Vaping Use: Never used  Substance Use Topics  . Alcohol use: No    Comment: Very rare   . Drug use: No         OPHTHALMIC EXAM: Base Eye Exam    Visual Acuity (Snellen - Linear)      Right Left   Dist cc CF @ 2' 20/40   Dist ph cc NI NI   Correction: Glasses       Tonometry (Tonopen, 10:01 AM)      Right Left   Pressure 8 8       Pupils      Pupils Dark Light Shape React APD   Right PERRL 3 3 Round Minimal None  Left PERRL 3 3 Round Minimal None       Visual Fields (Counting fingers)      Left Right   Restrictions Partial outer inferior nasal deficiency Partial outer superior nasal, inferior nasal deficiencies       Neuro/Psych    Oriented x3: Yes   Mood/Affect: Normal       Dilation    Left eye: 1.0% Mydriacyl, 2.5% Phenylephrine @ 10:01 AM        Slit Lamp and Fundus Exam    External Exam      Right Left   External Normal Normal       Slit Lamp Exam      Right Left   Lids/Lashes Normal Normal   Conjunctiva/Sclera White and quiet White and quiet   Cornea Clear Clear   Anterior Chamber Deep and quiet Deep and quiet   Iris Round and reactive Round and reactive   Lens Posterior chamber intraocular lens Posterior chamber intraocular lens   Anterior Vitreous Normal Normal       Fundus Exam      Right Left   Posterior Vitreous  Posterior vitreous detachment, Central vitreous floaters   Disc  Normal   C/D Ratio  0.4   Macula  Microaneurysms, Macular thickening,, milld CME, Cystoid macular edema superior to the FAZ,, FROM MACULAR BRVO   Vessels   BRVO,,, superior   Periphery  Normal          IMAGING AND PROCEDURES  Imaging and Procedures for 05/03/20  OCT, Retina - OU - Both Eyes       Right Eye Quality was good. Scan locations included subfoveal. Central Foveal Thickness: 238.   Left Eye Quality was good. Scan locations included subfoveal. Central Foveal Thickness: 302. Findings include cystoid macular edema.   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  Repeat injection today  extend interval examination to 8 or 9 weeks as patient preparing for back surgery       Intravitreal Injection, Pharmacologic Agent - OS - Left Eye       Time Out 05/03/2020. 11:11 AM. Confirmed correct patient, procedure, site, and patient consented.   Anesthesia Topical anesthesia was used. Anesthetic medications included Akten 3.5%.   Procedure Preparation included Ofloxacin , Tobramycin 0.3%, 10% betadine to eyelids, 5% betadine to ocular surface. A 30 gauge needle was used.   Injection:  2.5 mg Bevacizumab (AVASTIN) 2.5mg /0.49mL SOSY   NDC: 40981-191-47, Lot: [id number]   Route: Intravitreal, Site: Left Eye  Post-op Post injection exam found visual acuity of at least counting fingers. The patient tolerated the procedure well. There were no complications. The patient received written and verbal post procedure care education. Post injection medications were not given.                 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  We will repeat injection today at 6-week interval examination next in 8 to 9-week      ICD-10-CM   1. 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  Repeat intravitreal Avastin OS today at 6-week interval and extend interval as patient is preparing  for potential back surgery or other interventions first ongoing pain  2.  3.  Ophthalmic Meds Ordered this visit:  Meds ordered this encounter  Medications  . bevacizumab (AVASTIN) SOSY 2.5 mg       Return in about 9 weeks (around 07/05/2020) for dilate, OS, AVASTIN OCT.  There are no Patient Instructions on file for this visit.   Explained the diagnoses, plan, and follow up with the patient and they expressed  understanding.  Patient expressed understanding of the importance of proper follow up care.   Clent Demark Philamena Kramar M.D. Diseases & Surgery of the Retina and Vitreous [facility] [place] 05/03/20     Abbreviations: M myopia (nearsighted); A astigmatism; H hyperopia (farsighted); P presbyopia; Mrx spectacle prescription;  CTL contact lenses; OD right eye; OS left eye; OU both eyes  XT exotropia; ET esotropia; PEK punctate epithelial keratitis; PEE punctate epithelial erosions; DES dry eye syndrome; MGD meibomian gland dysfunction; ATs artificial tears; PFAT's preservative free artificial tears; [place] nuclear sclerotic cataract; PSC posterior subcapsular cataract; ERM epi-retinal membrane; PVD posterior vitreous detachment; RD retinal detachment; DM diabetes mellitus; DR diabetic retinopathy; NPDR non-proliferative diabetic retinopathy; PDR proliferative diabetic retinopathy; CSME clinically significant macular edema; DME diabetic macular edema; dbh dot blot hemorrhages; CWS cotton wool spot; POAG primary open angle glaucoma; C/D cup-to-disc ratio; HVF humphrey visual field; GVF goldmann visual field; OCT optical coherence tomography; IOP intraocular pressure; BRVO Branch retinal vein occlusion; CRVO central retinal vein occlusion; CRAO central retinal artery occlusion; BRAO branch retinal artery occlusion; RT retinal tear; SB scleral buckle; PPV pars plana vitrectomy; VH Vitreous hemorrhage; PRP panretinal laser photocoagulation; IVK intravitreal kenalog; VMT vitreomacular traction; MH Macular hole;  NVD neovascularization of the disc; NVE neovascularization elsewhere; AREDS age related eye disease study; ARMD age related macular degeneration; POAG primary open angle glaucoma; EBMD epithelial/anterior basement membrane dystrophy; ACIOL anterior chamber intraocular lens; IOL intraocular lens; PCIOL posterior chamber intraocular lens; Phaco/IOL phacoemulsification with intraocular lens placement; [place]  photorefractive keratectomy; LASIK laser assisted in situ keratomileusis; HTN hypertension; DM diabetes mellitus; COPD chronic obstructive pulmonary disease

## 2020-05-09 ENCOUNTER — Other Ambulatory Visit: Payer: Self-pay | Admitting: Neurosurgery

## 2020-05-09 DIAGNOSIS — M48061 Spinal stenosis, lumbar region without neurogenic claudication: Secondary | ICD-10-CM | POA: Insufficient documentation

## 2020-05-10 ENCOUNTER — Other Ambulatory Visit: Payer: Self-pay

## 2020-05-10 ENCOUNTER — Encounter (HOSPITAL_COMMUNITY): Payer: Self-pay | Admitting: Neurosurgery

## 2020-05-10 NOTE — Progress Notes (Signed)
PCP - Dr. Valora Piccolo Cardiologist - Dr. Oval Linsey   Chest x-ray -  EKG - DOS Stress Test -  ECHO - 2011 Cardiac Cath -   Sleep Study - yes CPAP - yes  Fasting Blood Sugar:   Checks Blood Sugar:  1x/day  Blood Thinner Instructions:  Aspirin Instructions: per pt wife last dose ASA 05/08/20  ERAS Protcol -   COVID TEST- DOS  Anesthesia review: yes  -------------  SDW INSTRUCTIONS:  Your procedure is scheduled on 05/11/20. Please report to [facility] Main Entrance "A" at [place].M., and check in at the Admitting office. Call this number if you have problems the morning of surgery: [phone]   Remember: Do not eat or drink after midnight the night before your surgery   Medications to take morning of surgery with a sip of water include: acetaminophen (TYLENOL) amLODipine (NORVASC)  carvedilol (COREG)  fluticasone (FLONASE) pantoprazole (PROTONIX) sertraline (ZOLOFT)  ** PLEASE check your blood sugar the morning of your surgery when you wake up and every 2 hours until you get to the Short Stay unit.  If your blood sugar is less than 70 mg/dL, you will need to treat for low blood sugar: - Do not take insulin. - Treat a low blood sugar (less than 70 mg/dL) with  cup of clear juice (cranberry or apple), 4 glucose tablets, OR glucose gel. - Recheck blood sugar in 15 minutes after treatment (to make sure it is greater than 70 mg/dL). If your blood sugar is not greater than 70 mg/dL on recheck, call [phone] for further instructions.    As of today, STOP taking any Aspirin (unless otherwise instructed by your surgeon), Aleve, Naproxen, Ibuprofen, Motrin, Advil, Goody's, BC's, all herbal medications, fish oil, and all vitamins.    The Morning of Surgery Do not wear jewelry Do not wear lotions, powders, colognes, or deodorant  Men may shave face and neck. Do not bring valuables to the hospital. [facility] is not responsible for any belongings or valuables. If you are a  smoker, DO NOT Smoke 24 hours prior to surgery If you wear a CPAP at night please bring your mask the morning of surgery  Remember that you must have someone to transport you home after your surgery, and remain with you for 24 hours if you are discharged the same day. Please bring cases for contacts, glasses, hearing aids, dentures or bridgework because it cannot be worn into surgery.   Patients discharged the day of surgery will not be allowed to drive home.   Please shower the NIGHT BEFORE SURGERY and the MORNING OF SURGERY with DIAL Soap. Wear comfortable clothes the morning of surgery. Oral Hygiene is also important to reduce your risk of infection.  Remember - BRUSH YOUR TEETH THE MORNING OF SURGERY WITH YOUR REGULAR TOOTHPASTE  Patient denies shortness of breath, fever, cough and chest pain.

## 2020-05-10 NOTE — Progress Notes (Signed)
Per pt wife Renardo Cheatum, pt tested positive in September 2021 (see [place]) at [facility]. Pt was exposed again in January and was symptomatic, so he tested himself around the last week of Jan 2022/first week of Feb 2022 and tested positive on a home test. Per pt wife pt was presenting with fever and cough at that time. Pt was at [facility] 04/14/20 d/t fall with 2 sprained ankles. Do not see covid result for that encounter, but wife claims that the nurse at [facility] told them he tested negative then. Pt is currently asymptomatic for covid. Due to no record, pt instructed to come in at [facility]

## 2020-05-11 ENCOUNTER — Inpatient Hospital Stay (HOSPITAL_COMMUNITY)
Admission: RE | Admit: 2020-05-11 | Discharge: 2020-05-19 | DRG: 455 | Disposition: A | Discharge status: Skilled Nursing Facility | Payer: Medicare HMO | Attending: Neurosurgery | Admitting: Neurosurgery

## 2020-05-11 ENCOUNTER — Inpatient Hospital Stay (HOSPITAL_COMMUNITY): Payer: Medicare HMO

## 2020-05-11 ENCOUNTER — Encounter (HOSPITAL_COMMUNITY): Payer: Self-pay | Admitting: Neurosurgery

## 2020-05-11 ENCOUNTER — Inpatient Hospital Stay (HOSPITAL_COMMUNITY): Payer: Medicare HMO | Admitting: Anesthesiology

## 2020-05-11 ENCOUNTER — Inpatient Hospital Stay (HOSPITAL_COMMUNITY)
Admission: RE | Disposition: A | Discharge status: Skilled Nursing Facility | Payer: Self-pay | Source: Home / Self Care | Attending: Neurosurgery

## 2020-05-11 DIAGNOSIS — R471 Dysarthria and anarthria: Secondary | ICD-10-CM | POA: Diagnosis not present

## 2020-05-11 DIAGNOSIS — Z880 Allergy status to penicillin: Secondary | ICD-10-CM | POA: Diagnosis not present

## 2020-05-11 DIAGNOSIS — E785 Hyperlipidemia, unspecified: Secondary | ICD-10-CM | POA: Diagnosis present

## 2020-05-11 DIAGNOSIS — Z419 Encounter for procedure for purposes other than remedying health state, unspecified: Secondary | ICD-10-CM

## 2020-05-11 DIAGNOSIS — M48062 Spinal stenosis, lumbar region with neurogenic claudication: Secondary | ICD-10-CM | POA: Diagnosis present

## 2020-05-11 DIAGNOSIS — Z7984 Long term (current) use of oral hypoglycemic drugs: Secondary | ICD-10-CM | POA: Diagnosis not present

## 2020-05-11 DIAGNOSIS — Z7982 Long term (current) use of aspirin: Secondary | ICD-10-CM

## 2020-05-11 DIAGNOSIS — Z981 Arthrodesis status: Secondary | ICD-10-CM | POA: Diagnosis not present

## 2020-05-11 DIAGNOSIS — R5383 Other fatigue: Secondary | ICD-10-CM | POA: Diagnosis not present

## 2020-05-11 DIAGNOSIS — E1122 Type 2 diabetes mellitus with diabetic chronic kidney disease: Secondary | ICD-10-CM | POA: Diagnosis present

## 2020-05-11 DIAGNOSIS — Z833 Family history of diabetes mellitus: Secondary | ICD-10-CM | POA: Diagnosis not present

## 2020-05-11 DIAGNOSIS — F32A Depression, unspecified: Secondary | ICD-10-CM | POA: Diagnosis present

## 2020-05-11 DIAGNOSIS — Z794 Long term (current) use of insulin: Secondary | ICD-10-CM

## 2020-05-11 DIAGNOSIS — Z87891 Personal history of nicotine dependence: Secondary | ICD-10-CM | POA: Diagnosis not present

## 2020-05-11 DIAGNOSIS — J449 Chronic obstructive pulmonary disease, unspecified: Secondary | ICD-10-CM | POA: Diagnosis present

## 2020-05-11 DIAGNOSIS — Z79899 Other long term (current) drug therapy: Secondary | ICD-10-CM | POA: Diagnosis not present

## 2020-05-11 DIAGNOSIS — M109 Gout, unspecified: Secondary | ICD-10-CM | POA: Diagnosis present

## 2020-05-11 DIAGNOSIS — Z8249 Family history of ischemic heart disease and other diseases of the circulatory system: Secondary | ICD-10-CM | POA: Diagnosis not present

## 2020-05-11 DIAGNOSIS — N189 Chronic kidney disease, unspecified: Secondary | ICD-10-CM | POA: Diagnosis present

## 2020-05-11 DIAGNOSIS — G4733 Obstructive sleep apnea (adult) (pediatric): Secondary | ICD-10-CM | POA: Diagnosis present

## 2020-05-11 DIAGNOSIS — M4856XA Collapsed vertebra, not elsewhere classified, lumbar region, initial encounter for fracture: Principal | ICD-10-CM | POA: Diagnosis present

## 2020-05-11 DIAGNOSIS — I129 Hypertensive chronic kidney disease with stage 1 through stage 4 chronic kidney disease, or unspecified chronic kidney disease: Secondary | ICD-10-CM | POA: Diagnosis present

## 2020-05-11 DIAGNOSIS — M5116 Intervertebral disc disorders with radiculopathy, lumbar region: Secondary | ICD-10-CM | POA: Diagnosis present

## 2020-05-11 DIAGNOSIS — Z20822 Contact with and (suspected) exposure to covid-19: Secondary | ICD-10-CM | POA: Diagnosis present

## 2020-05-11 DIAGNOSIS — S32000A Wedge compression fracture of unspecified lumbar vertebra, initial encounter for closed fracture: Secondary | ICD-10-CM | POA: Diagnosis present

## 2020-05-11 LAB — CBC
HCT: 34.8 % — ABNORMAL LOW (ref 39.0–52.0)
Hemoglobin: 11.7 g/dL — ABNORMAL LOW (ref 13.0–17.0)
MCH: 30.4 pg (ref 26.0–34.0)
MCHC: 33.6 g/dL (ref 30.0–36.0)
MCV: 90.4 fL (ref 80.0–100.0)
Platelets: 163 10*3/uL (ref 150–400)
RBC: 3.85 MIL/uL — ABNORMAL LOW (ref 4.22–5.81)
RDW: 13.2 % (ref 11.5–15.5)
WBC: 8.8 10*3/uL (ref 4.0–10.5)
nRBC: 0 % (ref 0.0–0.2)

## 2020-05-11 LAB — SURGICAL PCR SCREEN
MRSA, PCR: NEGATIVE
Staphylococcus aureus: NEGATIVE

## 2020-05-11 LAB — BASIC METABOLIC PANEL
Anion gap: 9 (ref 5–15)
BUN: 28 mg/dL — ABNORMAL HIGH (ref 8–23)
CO2: 28 mmol/L (ref 22–32)
Calcium: 8.9 mg/dL (ref 8.9–10.3)
Chloride: 104 mmol/L (ref 98–111)
Creatinine, Ser: 2.09 mg/dL — ABNORMAL HIGH (ref 0.61–1.24)
GFR, Estimated: 32 mL/min — ABNORMAL LOW (ref >60)
Glucose, Bld: 76 mg/dL (ref 70–99)
Potassium: 3.4 mmol/L — ABNORMAL LOW (ref 3.5–5.1)
Sodium: 141 mmol/L (ref 135–145)

## 2020-05-11 LAB — SARS CORONAVIRUS 2 BY RT PCR (HOSPITAL ORDER, PERFORMED IN ~~LOC~~ HOSPITAL LAB): SARS Coronavirus 2: NEGATIVE

## 2020-05-11 LAB — GLUCOSE, CAPILLARY
Glucose-Capillary: 107 mg/dL — ABNORMAL HIGH (ref 70–99)
Glucose-Capillary: 114 mg/dL — ABNORMAL HIGH (ref 70–99)
Glucose-Capillary: 120 mg/dL — ABNORMAL HIGH (ref 70–99)
Glucose-Capillary: 234 mg/dL — ABNORMAL HIGH (ref 70–99)
Glucose-Capillary: 250 mg/dL — ABNORMAL HIGH (ref 70–99)
Glucose-Capillary: 72 mg/dL (ref 70–99)

## 2020-05-11 LAB — TYPE AND SCREEN
ABO/RH(D): B POS
Antibody Screen: NEGATIVE

## 2020-05-11 LAB — HEMOGLOBIN A1C
Hgb A1c MFr Bld: 9.3 % — ABNORMAL HIGH (ref 4.8–5.6)
Mean Plasma Glucose: 220.21 mg/dL

## 2020-05-11 SURGERY — POSTERIOR LUMBAR FUSION 1 LEVEL
Anesthesia: General

## 2020-05-11 MED ORDER — CYCLOBENZAPRINE HCL 10 MG PO TABS
10.0000 mg | ORAL_TABLET | Freq: Three times a day (TID) | ORAL | Status: DC | PRN
  Administered 2020-05-11 – 2020-05-18 (×6): 10 mg via ORAL
  Filled 2020-05-11 (×6): qty 1

## 2020-05-11 MED ORDER — LIDOCAINE 2% (20 MG/ML) 5 ML SYRINGE
INTRAMUSCULAR | Status: AC
  Filled 2020-05-11: qty 5

## 2020-05-11 MED ORDER — DEXAMETHASONE SODIUM PHOSPHATE 10 MG/ML IJ SOLN
INTRAMUSCULAR | Status: DC | PRN
  Administered 2020-05-11: 4 mg via INTRAVENOUS

## 2020-05-11 MED ORDER — FLUTICASONE PROPIONATE 50 MCG/ACT NA SUSP
1.0000 | Freq: Every day | NASAL | Status: DC | PRN
  Filled 2020-05-11: qty 16

## 2020-05-11 MED ORDER — DEXAMETHASONE SODIUM PHOSPHATE 10 MG/ML IJ SOLN
INTRAMUSCULAR | Status: AC
  Filled 2020-05-11: qty 1

## 2020-05-11 MED ORDER — PROPOFOL 10 MG/ML IV BOLUS
INTRAVENOUS | Status: DC | PRN
  Administered 2020-05-11: 140 mg via INTRAVENOUS
  Administered 2020-05-11: 20 mg via INTRAVENOUS

## 2020-05-11 MED ORDER — ONDANSETRON HCL 4 MG/2ML IJ SOLN
INTRAMUSCULAR | Status: AC
  Filled 2020-05-11: qty 2

## 2020-05-11 MED ORDER — ORAL CARE MOUTH RINSE: 15.0000 mL | Freq: Once | OROMUCOSAL | Status: AC

## 2020-05-11 MED ORDER — VANCOMYCIN HCL IN DEXTROSE 1-5 GM/200ML-% IV SOLN
1000.0000 mg | INTRAVENOUS | Status: AC
  Administered 2020-05-11: 1000 mg via INTRAVENOUS
  Filled 2020-05-11: qty 200

## 2020-05-11 MED ORDER — PANTOPRAZOLE SODIUM 40 MG PO TBEC
40.0000 mg | DELAYED_RELEASE_TABLET | Freq: Every day | ORAL | Status: DC
  Administered 2020-05-12 – 2020-05-19 (×8): 40 mg via ORAL
  Filled 2020-05-11 (×8): qty 1

## 2020-05-11 MED ORDER — 0.9 % SODIUM CHLORIDE (POUR BTL) OPTIME
TOPICAL | Status: DC | PRN
  Administered 2020-05-11 (×2): 1000 mL

## 2020-05-11 MED ORDER — BUPIVACAINE LIPOSOME 1.3 % IJ SUSP
INTRAMUSCULAR | Status: DC | PRN
  Administered 2020-05-11: 20 mL

## 2020-05-11 MED ORDER — OXYCODONE HCL 5 MG PO TABS
5.0000 mg | ORAL_TABLET | ORAL | Status: DC | PRN
  Administered 2020-05-11 – 2020-05-19 (×14): 5 mg via ORAL
  Filled 2020-05-11 (×14): qty 1

## 2020-05-11 MED ORDER — ACETAMINOPHEN 650 MG RE SUPP: 650.0000 mg | RECTAL | Status: DC | PRN

## 2020-05-11 MED ORDER — CARVEDILOL 12.5 MG PO TABS
25.0000 mg | ORAL_TABLET | Freq: Two times a day (BID) | ORAL | Status: DC
  Administered 2020-05-12 – 2020-05-19 (×15): 25 mg via ORAL
  Filled 2020-05-11 (×15): qty 2

## 2020-05-11 MED ORDER — FENTANYL CITRATE (PF) 100 MCG/2ML IJ SOLN
INTRAMUSCULAR | Status: AC
  Administered 2020-05-11: 25 ug via INTRAVENOUS
  Filled 2020-05-11: qty 2

## 2020-05-11 MED ORDER — ALBUMIN HUMAN 5 % IV SOLN: INTRAVENOUS | Status: DC | PRN

## 2020-05-11 MED ORDER — ACETAMINOPHEN 500 MG PO TABS
1000.0000 mg | ORAL_TABLET | Freq: Four times a day (QID) | ORAL | Status: AC
  Administered 2020-05-11 – 2020-05-12 (×4): 1000 mg via ORAL
  Filled 2020-05-11 (×4): qty 2

## 2020-05-11 MED ORDER — ACETAMINOPHEN 325 MG PO TABS
650.0000 mg | ORAL_TABLET | ORAL | Status: DC | PRN
  Administered 2020-05-18: 650 mg via ORAL
  Filled 2020-05-11: qty 2

## 2020-05-11 MED ORDER — SODIUM CHLORIDE 0.9 % IV SOLN: INTRAVENOUS | Status: DC | PRN

## 2020-05-11 MED ORDER — CHLORHEXIDINE GLUCONATE CLOTH 2 % EX PADS: 6.0000 | MEDICATED_PAD | Freq: Once | CUTANEOUS | Status: DC

## 2020-05-11 MED ORDER — MIDAZOLAM HCL 5 MG/5ML IJ SOLN
INTRAMUSCULAR | Status: DC | PRN
  Administered 2020-05-11: 2 mg via INTRAVENOUS

## 2020-05-11 MED ORDER — PHENYLEPHRINE HCL-NACL 10-0.9 MG/250ML-% IV SOLN
INTRAVENOUS | Status: DC | PRN
  Administered 2020-05-11: 20 ug/min via INTRAVENOUS

## 2020-05-11 MED ORDER — CHLORHEXIDINE GLUCONATE 0.12 % MT SOLN
OROMUCOSAL | Status: AC
  Administered 2020-05-11: 15 mL via OROMUCOSAL
  Filled 2020-05-11: qty 15

## 2020-05-11 MED ORDER — ONDANSETRON HCL 4 MG/2ML IJ SOLN: 4.0000 mg | Freq: Once | INTRAMUSCULAR | Status: DC | PRN

## 2020-05-11 MED ORDER — BUPIVACAINE LIPOSOME 1.3 % IJ SUSP
INTRAMUSCULAR | Status: AC
  Filled 2020-05-11: qty 20

## 2020-05-11 MED ORDER — SODIUM CHLORIDE 0.9% FLUSH
3.0000 mL | Freq: Two times a day (BID) | INTRAVENOUS | Status: DC
  Administered 2020-05-11 – 2020-05-19 (×12): 3 mL via INTRAVENOUS

## 2020-05-11 MED ORDER — BUPIVACAINE HCL (PF) 0.5 % IJ SOLN
INTRAMUSCULAR | Status: AC
  Filled 2020-05-11: qty 30

## 2020-05-11 MED ORDER — ONDANSETRON HCL 4 MG PO TABS: 4.0000 mg | ORAL_TABLET | Freq: Four times a day (QID) | ORAL | Status: DC | PRN

## 2020-05-11 MED ORDER — PIOGLITAZONE HCL 30 MG PO TABS
30.0000 mg | ORAL_TABLET | Freq: Every day | ORAL | Status: DC
  Filled 2020-05-11: qty 1

## 2020-05-11 MED ORDER — THROMBIN 5000 UNITS EX SOLR
CUTANEOUS | Status: AC
  Filled 2020-05-11: qty 5000

## 2020-05-11 MED ORDER — MIDAZOLAM HCL 2 MG/2ML IJ SOLN
INTRAMUSCULAR | Status: AC
  Filled 2020-05-11: qty 2

## 2020-05-11 MED ORDER — EPHEDRINE SULFATE-NACL 50-0.9 MG/10ML-% IV SOSY
PREFILLED_SYRINGE | INTRAVENOUS | Status: DC | PRN
  Administered 2020-05-11: 20 mg via INTRAVENOUS

## 2020-05-11 MED ORDER — PROPOFOL 10 MG/ML IV BOLUS
INTRAVENOUS | Status: AC
  Filled 2020-05-11: qty 20

## 2020-05-11 MED ORDER — BACITRACIN ZINC 500 UNIT/GM EX OINT
TOPICAL_OINTMENT | CUTANEOUS | Status: AC
  Filled 2020-05-11: qty 28.35

## 2020-05-11 MED ORDER — THROMBIN 5000 UNITS EX SOLR
OROMUCOSAL | Status: DC | PRN
  Administered 2020-05-11 (×2): 5 mL via TOPICAL

## 2020-05-11 MED ORDER — LISINOPRIL 20 MG PO TABS
40.0000 mg | ORAL_TABLET | Freq: Every day | ORAL | Status: DC
  Administered 2020-05-11 – 2020-05-19 (×9): 40 mg via ORAL
  Filled 2020-05-11 (×9): qty 2

## 2020-05-11 MED ORDER — DEXTROSE 50 % IV SOLN
INTRAVENOUS | Status: AC
  Administered 2020-05-11: 12.5 g via INTRAVENOUS
  Filled 2020-05-11: qty 50

## 2020-05-11 MED ORDER — GLYCOPYRROLATE PF 0.2 MG/ML IJ SOSY
PREFILLED_SYRINGE | INTRAMUSCULAR | Status: AC
  Filled 2020-05-11: qty 1

## 2020-05-11 MED ORDER — ONDANSETRON HCL 4 MG/2ML IJ SOLN: 4.0000 mg | Freq: Four times a day (QID) | INTRAMUSCULAR | Status: DC | PRN

## 2020-05-11 MED ORDER — VANCOMYCIN HCL 1000 MG/200ML IV SOLN
1000.0000 mg | Freq: Once | INTRAVENOUS | Status: AC
  Administered 2020-05-12: 1000 mg via INTRAVENOUS
  Filled 2020-05-11: qty 200

## 2020-05-11 MED ORDER — BISACODYL 10 MG RE SUPP
10.0000 mg | Freq: Every day | RECTAL | Status: DC | PRN
  Administered 2020-05-15: 10 mg via RECTAL
  Filled 2020-05-11: qty 1

## 2020-05-11 MED ORDER — FENTANYL CITRATE (PF) 100 MCG/2ML IJ SOLN
INTRAMUSCULAR | Status: AC
  Administered 2020-05-11: 50 ug via INTRAVENOUS
  Filled 2020-05-11: qty 2

## 2020-05-11 MED ORDER — AMLODIPINE BESYLATE 2.5 MG PO TABS
2.5000 mg | ORAL_TABLET | Freq: Every day | ORAL | Status: DC
  Filled 2020-05-11: qty 1

## 2020-05-11 MED ORDER — SODIUM CHLORIDE 0.9 % IV SOLN
250.0000 mL | INTRAVENOUS | Status: DC
  Administered 2020-05-12: 250 mL via INTRAVENOUS

## 2020-05-11 MED ORDER — FENTANYL CITRATE (PF) 100 MCG/2ML IJ SOLN
25.0000 ug | INTRAMUSCULAR | Status: DC | PRN
  Administered 2020-05-11: 50 ug via INTRAVENOUS
  Administered 2020-05-11: 25 ug via INTRAVENOUS

## 2020-05-11 MED ORDER — METFORMIN HCL 500 MG PO TABS
1000.0000 mg | ORAL_TABLET | Freq: Two times a day (BID) | ORAL | Status: DC
  Filled 2020-05-11: qty 2

## 2020-05-11 MED ORDER — SERTRALINE HCL 100 MG PO TABS
100.0000 mg | ORAL_TABLET | Freq: Every day | ORAL | Status: DC
  Administered 2020-05-12 – 2020-05-19 (×8): 100 mg via ORAL
  Filled 2020-05-11 (×8): qty 1

## 2020-05-11 MED ORDER — CHLORHEXIDINE GLUCONATE 0.12 % MT SOLN: 15.0000 mL | Freq: Once | OROMUCOSAL | Status: AC

## 2020-05-11 MED ORDER — LIDOCAINE 2% (20 MG/ML) 5 ML SYRINGE
INTRAMUSCULAR | Status: DC | PRN
  Administered 2020-05-11: 40 mg via INTRAVENOUS

## 2020-05-11 MED ORDER — PHENOL 1.4 % MT LIQD: 1.0000 | OROMUCOSAL | Status: DC | PRN

## 2020-05-11 MED ORDER — DOCUSATE SODIUM 100 MG PO CAPS
100.0000 mg | ORAL_CAPSULE | Freq: Two times a day (BID) | ORAL | Status: DC
  Administered 2020-05-11 – 2020-05-19 (×15): 100 mg via ORAL
  Filled 2020-05-11 (×16): qty 1

## 2020-05-11 MED ORDER — EPHEDRINE 5 MG/ML INJ
INTRAVENOUS | Status: AC
  Filled 2020-05-11: qty 10

## 2020-05-11 MED ORDER — ONDANSETRON HCL 4 MG/2ML IJ SOLN
INTRAMUSCULAR | Status: DC | PRN
  Administered 2020-05-11: 4 mg via INTRAVENOUS

## 2020-05-11 MED ORDER — SUGAMMADEX SODIUM 200 MG/2ML IV SOLN
INTRAVENOUS | Status: DC | PRN
  Administered 2020-05-11: 200 mg via INTRAVENOUS

## 2020-05-11 MED ORDER — BACITRACIN ZINC 500 UNIT/GM EX OINT
TOPICAL_OINTMENT | CUTANEOUS | Status: DC | PRN
  Administered 2020-05-11: 1 via TOPICAL

## 2020-05-11 MED ORDER — LINAGLIPTIN 5 MG PO TABS
5.0000 mg | ORAL_TABLET | Freq: Every day | ORAL | Status: DC
  Filled 2020-05-11: qty 1

## 2020-05-11 MED ORDER — AMLODIPINE BESYLATE 5 MG PO TABS
5.0000 mg | ORAL_TABLET | Freq: Every day | ORAL | Status: DC
  Administered 2020-05-12 – 2020-05-19 (×8): 5 mg via ORAL
  Filled 2020-05-11 (×8): qty 1

## 2020-05-11 MED ORDER — PHENYLEPHRINE 40 MCG/ML (10ML) SYRINGE FOR IV PUSH (FOR BLOOD PRESSURE SUPPORT)
PREFILLED_SYRINGE | INTRAVENOUS | Status: DC | PRN
  Administered 2020-05-11: 120 ug via INTRAVENOUS

## 2020-05-11 MED ORDER — BUPIVACAINE-EPINEPHRINE (PF) 0.25% -1:200000 IJ SOLN
INTRAMUSCULAR | Status: AC
  Filled 2020-05-11: qty 30

## 2020-05-11 MED ORDER — INSULIN ASPART 100 UNIT/ML ~~LOC~~ SOLN
0.0000 [IU] | SUBCUTANEOUS | Status: DC
  Administered 2020-05-11: 7 [IU] via SUBCUTANEOUS
  Administered 2020-05-12 (×2): 4 [IU] via SUBCUTANEOUS
  Administered 2020-05-12 (×3): 7 [IU] via SUBCUTANEOUS
  Administered 2020-05-12 – 2020-05-14 (×5): 3 [IU] via SUBCUTANEOUS
  Administered 2020-05-15: 15 [IU] via SUBCUTANEOUS
  Administered 2020-05-15: 4 [IU] via SUBCUTANEOUS
  Administered 2020-05-16: 7 [IU] via SUBCUTANEOUS
  Administered 2020-05-16: 4 [IU] via SUBCUTANEOUS
  Administered 2020-05-16: 7 [IU] via SUBCUTANEOUS
  Administered 2020-05-16: 3 [IU] via SUBCUTANEOUS
  Administered 2020-05-17: 11 [IU] via SUBCUTANEOUS
  Administered 2020-05-17 (×2): 3 [IU] via SUBCUTANEOUS
  Administered 2020-05-17 – 2020-05-18 (×2): 4 [IU] via SUBCUTANEOUS
  Administered 2020-05-18: 11 [IU] via SUBCUTANEOUS
  Administered 2020-05-18 – 2020-05-19 (×2): 7 [IU] via SUBCUTANEOUS

## 2020-05-11 MED ORDER — GLIPIZIDE ER 10 MG PO TB24
10.0000 mg | ORAL_TABLET | Freq: Two times a day (BID) | ORAL | Status: DC
  Administered 2020-05-12 – 2020-05-19 (×15): 10 mg via ORAL
  Filled 2020-05-11 (×17): qty 1

## 2020-05-11 MED ORDER — MENTHOL 3 MG MT LOZG: 1.0000 | LOZENGE | OROMUCOSAL | Status: DC | PRN

## 2020-05-11 MED ORDER — ROCURONIUM BROMIDE 10 MG/ML (PF) SYRINGE
PREFILLED_SYRINGE | INTRAVENOUS | Status: DC | PRN
  Administered 2020-05-11: 60 mg via INTRAVENOUS

## 2020-05-11 MED ORDER — SODIUM CHLORIDE 0.9% FLUSH: 3.0000 mL | INTRAVENOUS | Status: DC | PRN

## 2020-05-11 MED ORDER — ATORVASTATIN CALCIUM 10 MG PO TABS
20.0000 mg | ORAL_TABLET | Freq: Every day | ORAL | Status: DC
  Administered 2020-05-11 – 2020-05-18 (×8): 20 mg via ORAL
  Filled 2020-05-11 (×8): qty 2

## 2020-05-11 MED ORDER — CHLORHEXIDINE GLUCONATE CLOTH 2 % EX PADS: 6.0000 | MEDICATED_PAD | Freq: Every day | CUTANEOUS | Status: DC

## 2020-05-11 MED ORDER — BUPIVACAINE-EPINEPHRINE 0.25% -1:200000 IJ SOLN
INTRAMUSCULAR | Status: DC | PRN
  Administered 2020-05-11: 10 mL

## 2020-05-11 MED ORDER — LACTATED RINGERS IV SOLN: INTRAVENOUS | Status: DC

## 2020-05-11 MED ORDER — HYDROCHLOROTHIAZIDE 12.5 MG PO CAPS
12.5000 mg | ORAL_CAPSULE | Freq: Every day | ORAL | Status: DC
  Administered 2020-05-11 – 2020-05-19 (×9): 12.5 mg via ORAL
  Filled 2020-05-11 (×9): qty 1

## 2020-05-11 MED ORDER — THROMBIN 20000 UNITS EX KIT
PACK | CUTANEOUS | Status: AC
  Filled 2020-05-11: qty 1

## 2020-05-11 MED ORDER — PHENYLEPHRINE HCL (PRESSORS) 10 MG/ML IV SOLN
INTRAVENOUS | Status: AC
  Filled 2020-05-11: qty 1

## 2020-05-11 MED ORDER — TERAZOSIN HCL 5 MG PO CAPS
5.0000 mg | ORAL_CAPSULE | Freq: Every day | ORAL | Status: DC
  Administered 2020-05-11 – 2020-05-18 (×8): 5 mg via ORAL
  Filled 2020-05-11 (×9): qty 1

## 2020-05-11 MED ORDER — SUFENTANIL CITRATE 50 MCG/ML IV SOLN
INTRAVENOUS | Status: AC
  Filled 2020-05-11: qty 1

## 2020-05-11 MED ORDER — SUFENTANIL CITRATE 50 MCG/ML IV SOLN
INTRAVENOUS | Status: DC | PRN
  Administered 2020-05-11: 5 ug via INTRAVENOUS
  Administered 2020-05-11: 10 ug via INTRAVENOUS
  Administered 2020-05-11: 15 ug via INTRAVENOUS

## 2020-05-11 MED ORDER — MORPHINE SULFATE (PF) 4 MG/ML IV SOLN
4.0000 mg | INTRAVENOUS | Status: DC | PRN
Start: 2020-05-11 — End: 2020-05-19

## 2020-05-11 MED ORDER — OXYCODONE HCL 5 MG PO TABS: 5.0000 mg | ORAL_TABLET | Freq: Once | ORAL | Status: DC | PRN

## 2020-05-11 MED ORDER — DEXTROSE 50 % IV SOLN
12.5000 g | INTRAVENOUS | Status: AC
  Filled 2020-05-11: qty 50

## 2020-05-11 MED ORDER — ROCURONIUM BROMIDE 10 MG/ML (PF) SYRINGE
PREFILLED_SYRINGE | INTRAVENOUS | Status: AC
  Filled 2020-05-11: qty 10

## 2020-05-11 MED ORDER — GLYCOPYRROLATE PF 0.2 MG/ML IJ SOSY
PREFILLED_SYRINGE | INTRAMUSCULAR | Status: DC | PRN
  Administered 2020-05-11: .2 mg via INTRAVENOUS

## 2020-05-11 MED ORDER — OXYCODONE HCL 5 MG/5ML PO SOLN: 5.0000 mg | Freq: Once | ORAL | Status: DC | PRN

## 2020-05-11 MED ORDER — OXYCODONE HCL 5 MG PO TABS
10.0000 mg | ORAL_TABLET | ORAL | Status: DC | PRN
  Administered 2020-05-11 – 2020-05-19 (×7): 10 mg via ORAL
  Filled 2020-05-11 (×8): qty 2

## 2020-05-11 SURGICAL SUPPLY — 67 items
BASKET BONE COLLECTION (BASKET) ×2 IMPLANT
BENZOIN TINCTURE PRP APPL 2/3 (GAUZE/BANDAGES/DRESSINGS) ×2 IMPLANT
BLADE CLIPPER SURG (BLADE) ×2 IMPLANT
BUR MATCHSTICK NEURO 3.0 LAGG (BURR) ×2 IMPLANT
BUR PRECISION FLUTE 6.0 (BURR) ×2 IMPLANT
CANISTER SUCT 3000ML PPV (MISCELLANEOUS) ×2 IMPLANT
CAP LOCK CREO FENESTRAT 5.5 (CAP) ×8 IMPLANT
CARTRIDGE OIL MAESTRO DRILL (MISCELLANEOUS) ×1 IMPLANT
CEMENT KYPHON CX01A KIT/MIXER (Cement) ×2 IMPLANT
CNTNR URN SCR LID CUP LEK RST (MISCELLANEOUS) ×1 IMPLANT
CONT SPEC 4OZ STRL OR WHT (MISCELLANEOUS) ×2
COVER BACK TABLE 60X90IN (DRAPES) ×2 IMPLANT
COVER WAND RF STERILE (DRAPES) ×2 IMPLANT
DECANTER SPIKE VIAL GLASS SM (MISCELLANEOUS) ×2 IMPLANT
DIFFUSER DRILL AIR PNEUMATIC (MISCELLANEOUS) ×2 IMPLANT
DRAPE C-ARM 42X72 X-RAY (DRAPES) ×8 IMPLANT
DRAPE HALF SHEET 40X57 (DRAPES) ×6 IMPLANT
DRAPE HALF SHEET 70X43 (DRAPES) ×4 IMPLANT
DRAPE LAPAROTOMY 100X72X124 (DRAPES) ×2 IMPLANT
DRAPE SURG 17X23 STRL (DRAPES) ×8 IMPLANT
DRIVER SHAFT CREO (MISCELLANEOUS) ×8 IMPLANT
DRSG OPSITE POSTOP 4X6 (GAUZE/BANDAGES/DRESSINGS) ×2 IMPLANT
DRSG OPSITE POSTOP 4X8 (GAUZE/BANDAGES/DRESSINGS) ×2 IMPLANT
ELECT BLADE 4.0 EZ CLEAN MEGAD (MISCELLANEOUS) ×2
ELECT REM PT RETURN 9FT ADLT (ELECTROSURGICAL) ×2
ELECTRODE BLDE 4.0 EZ CLN MEGD (MISCELLANEOUS) ×1 IMPLANT
ELECTRODE REM PT RTRN 9FT ADLT (ELECTROSURGICAL) ×1 IMPLANT
EVACUATOR 1/8 PVC DRAIN (DRAIN) IMPLANT
GAUZE 4X4 16PLY RFD (DISPOSABLE) ×2 IMPLANT
GLOVE BIO SURGEON STRL SZ8 (GLOVE) ×4 IMPLANT
GLOVE BIO SURGEON STRL SZ8.5 (GLOVE) ×4 IMPLANT
GLOVE EXAM NITRILE XL STR (GLOVE) IMPLANT
GLOVE SURG UNDER POLY LF SZ6.5 (GLOVE) ×4 IMPLANT
GOWN STRL REUS W/ TWL LRG LVL3 (GOWN DISPOSABLE) ×1 IMPLANT
GOWN STRL REUS W/ TWL XL LVL3 (GOWN DISPOSABLE) ×2 IMPLANT
GOWN STRL REUS W/TWL 2XL LVL3 (GOWN DISPOSABLE) IMPLANT
GOWN STRL REUS W/TWL LRG LVL3 (GOWN DISPOSABLE) ×2
GOWN STRL REUS W/TWL XL LVL3 (GOWN DISPOSABLE) ×4
GRAFT TRIN ELITE MED MUSC TRAN (Graft) ×2 IMPLANT
HEMOSTAT POWDER KIT SURGIFOAM (HEMOSTASIS) ×4 IMPLANT
KIT BASIN OR (CUSTOM PROCEDURE TRAY) ×2 IMPLANT
KIT INFUSE X SMALL 1.4CC (Orthopedic Implant) ×2 IMPLANT
KIT TURNOVER KIT B (KITS) ×2 IMPLANT
MILL MEDIUM DISP (BLADE) ×2 IMPLANT
NEEDLE HYPO 21X1.5 SAFETY (NEEDLE) ×2 IMPLANT
NEEDLE HYPO 22GX1.5 SAFETY (NEEDLE) ×2 IMPLANT
NS IRRIG 1000ML POUR BTL (IV SOLUTION) ×4 IMPLANT
OIL CARTRIDGE MAESTRO DRILL (MISCELLANEOUS) ×2
PACK AFFIRM FILLER DELIVERY (MISCELLANEOUS) ×2 IMPLANT
PACK LAMINECTOMY NEURO (CUSTOM PROCEDURE TRAY) ×2 IMPLANT
PAD ARMBOARD 7.5X6 YLW CONV (MISCELLANEOUS) ×6 IMPLANT
PATTIES SURGICAL .5 X1 (DISPOSABLE) IMPLANT
ROD CREO 45MM SPINAL (Rod) ×4 IMPLANT
SCREW PA FENS CREO 7.5X55 (Screw) ×8 IMPLANT
SCREW PA FENS CREO 8.5X50 (Screw) IMPLANT
SPONGE LAP 4X18 RFD (DISPOSABLE) IMPLANT
SPONGE NEURO XRAY DETECT 1X3 (DISPOSABLE) IMPLANT
SPONGE SURGIFOAM ABS GEL 100 (HEMOSTASIS) IMPLANT
STRIP CLOSURE SKIN 1/2X4 (GAUZE/BANDAGES/DRESSINGS) ×2 IMPLANT
SUT VIC AB 1 CT1 18XBRD ANBCTR (SUTURE) ×2 IMPLANT
SUT VIC AB 1 CT1 8-18 (SUTURE) ×4
SUT VIC AB 2-0 CP2 18 (SUTURE) ×4 IMPLANT
SYR 20ML LL LF (SYRINGE) ×2 IMPLANT
TOWEL GREEN STERILE (TOWEL DISPOSABLE) ×2 IMPLANT
TOWEL GREEN STERILE FF (TOWEL DISPOSABLE) ×2 IMPLANT
TRAY FOLEY MTR SLVR 16FR STAT (SET/KITS/TRAYS/PACK) ×2 IMPLANT
WATER STERILE IRR 1000ML POUR (IV SOLUTION) ×2 IMPLANT

## 2020-05-11 NOTE — Transfer of Care (Signed)
Immediate Anesthesia Transfer of Care Note  Patient: Jeffery Fernandez  Procedure(s) Performed: Lumbar four-five Decompression, instrumentation, Posterior lateral arthrodesis, vertebroplasty (N/A )  Patient Location: PACU  Anesthesia Type:General  Level of Consciousness: drowsy and patient cooperative  Airway & Oxygen Therapy: Patient Spontanous Breathing and Patient connected to nasal cannula oxygen  Post-op Assessment: Report given to RN, Post -op Vital signs reviewed and stable and Patient moving all extremities  Post vital signs: Reviewed and stable  Last Vitals:  Vitals Value Taken Time  BP 120/63 05/11/20 1610  Temp    Pulse 72 05/11/20 1611  Resp 15 05/11/20 1611  SpO2 96 % 05/11/20 1611  Vitals shown include unvalidated device data.  Last Pain:  Vitals:   05/11/20 0855  TempSrc: Oral         Complications: No complications documented.

## 2020-05-11 NOTE — Progress Notes (Signed)
Orthopedic Tech Progress Note Patient Details:  Jeffery Fernandez [birth year] [id number] Patient family stated that patient has brace in car from before. Patient ID: [name], male   DOB: [birth year], 75 y.o.   MRN: [id number]   [name] 05/11/2020, 7:36 PM

## 2020-05-11 NOTE — Anesthesia Postprocedure Evaluation (Signed)
Anesthesia Post Note  Patient: Jeffery Fernandez  Procedure(s) Performed: Lumbar four-five Decompression, instrumentation, Posterior lateral arthrodesis, vertebroplasty (N/A )     Patient location during evaluation: PACU Anesthesia Type: General Level of consciousness: awake and alert Pain management: pain level controlled Vital Signs Assessment: post-procedure vital signs reviewed and stable Respiratory status: spontaneous breathing, nonlabored ventilation, respiratory function stable and patient connected to nasal cannula oxygen Cardiovascular status: blood pressure returned to baseline and stable Postop Assessment: no apparent nausea or vomiting Anesthetic complications: no   No complications documented.  Last Vitals:  Vitals:   05/11/20 1750 05/11/20 1832  BP: 128/65 131/64  Pulse: 70 61  Resp: 15 16  Temp:  36.5 C  SpO2: 95% 98%    Last Pain:  Vitals:   05/11/20 1832  TempSrc: Oral  PainSc: 10-Worst pain ever   Pain Goal:                   [name]

## 2020-05-11 NOTE — Progress Notes (Signed)
Was not notified about CBG 72 @ 0852. Pt was informant, Dr. Linna Caprice notified immediately. Verbal order for half amp of D50. Orders placed and will administer med as soon as IV is placed. Will continue to monitor. CBG to be rechecked 1hr post D50 administration per Dr. Linna Caprice.

## 2020-05-11 NOTE — H&P (Signed)
Subjective: The patient is a 75 year old white male known whom I performed a L3 kyphoplasty years ago.  More recently he took a fall and has increasing back and bilateral leg pain.  He has failed medical management.  He was worked up with lumbar x-rays and lumbar MRI which demonstrated an L4 compression fracture with spinal stenosis.  I discussed the various treatment options with him.  He has decided proceed with surgery.  Past Medical History:  Diagnosis Date  . Arrhythmia   . Arthritis   . Cataract    bilateral cataract extraction with intraoccular lens implants  . Chest pain, unspecified   . Chronic airway obstruction, not elsewhere classified    PT. DENIES HE HAS COPD  . CKD (chronic kidney disease)   . Colon polyps    Adenomatous Polyps 2007  . Depression   . Diabetes mellitus   . Diverticulosis   . Gout   . Heart murmur   . History of kidney stones   . Hyperlipemia   . Hypertension   . Nephrolithiasis   . Normocytic anemia 04/23/2017  . Obstructive sleep apnea (adult) (pediatric)   . Other and unspecified hyperlipidemia   . Other left bundle branch block   . Retinal vascular occlusion, unspecified    right  . Shortness of breath    on exertion  . Unspecified sleep apnea   . Vision loss of right eye   . White coat syndrome with diagnosis of hypertension 09/19/2017    Past Surgical History:  Procedure Laterality Date  . ANTERIOR CERVICAL DECOMP/DISCECTOMY FUSION N/A 06/26/2016   Procedure: Re-Exploration of Cervical Wound;  Surgeon: Kary Kos, MD;  Location: [place];  Service: Neurosurgery;  Laterality: N/A;  . ANTERIOR CERVICAL DECOMP/DISCECTOMY FUSION N/A 06/26/2016   Procedure: ANTERIOR CERVICAL DECOMPRESSION/DISCECTOMY FUSION, INTERBODY PROSTESIS, PLATE, CERVICAL THREE CERVICAL FOUR, CERVICAL FOUR CERVICAL FIVE CERVICAL SIX;  Surgeon: Newman Pies, MD;  Location: [place];  Service: Neurosurgery;  Laterality: N/A;  . CATARACT EXTRACTION Bilateral   . KIDNEY STONE  SURGERY    . KYPHOPLASTY N/A 01/19/2018   Procedure: KYPHOPLASTY LUMBAR THREE;  Surgeon: Newman Pies, MD;  Location: [place];  Service: Neurosurgery;  Laterality: N/A;  KYPHOPLASTY LUMBAR THREE    Allergies  Allergen Reactions  . Penicillins Other (See Comments)    Possibly rash - not sure Has patient had a PCN reaction causing immediate rash, facial/tongue/throat swelling, SOB or lightheadedness with hypotension:unknown Has patient had a PCN reaction causing severe rash involving mucus membranes or skin necrosis: Unknown Has patient had a PCN reaction that required hospitalization: Unknown Has patient had a PCN reaction occurring within the last 10 years: No Childhood reaction If all of the above answers are "NO", then may proceed with Cephalosporin use.     Social History   Tobacco Use  . Smoking status: Former Smoker    Packs/day: 1.50    Years: 25.00    Pack years: 37.50    Types: Cigarettes    Quit date: 01/28/1986    Years since quitting: 34.3  . Smokeless tobacco: Never Used  Substance Use Topics  . Alcohol use: Not Currently    Comment: Very rare    Family History  Problem Relation Age of Onset  . Heart attack Brother 60       heart attack and CHF/ also some form of EP ablation  . Parkinson's disease Brother   . Atrial fibrillation Brother   . Heart disease Father   . Stroke Father   .  Uterine cancer Mother   . Arthritis/Rheumatoid Cousin   . Diabetes Other   . Colon cancer Neg Hx   . Stomach cancer Neg Hx   . Liver cancer Neg Hx   . Rectal cancer Neg Hx   . Esophageal cancer Neg Hx    Prior to Admission medications   Medication Sig Start Date End Date Taking? Authorizing Provider  acetaminophen (TYLENOL) 500 MG tablet Take 1,000 mg by mouth every 6 (six) hours as needed (for pain.).   Yes [provider]  amLODipine (NORVASC) 2.5 MG tablet TAKE 1 TABLET BY MOUTH EVERY DAY 12/25/18  Yes Skeet Latch, MD  amLODipine (NORVASC) 5 MG tablet Take  5 mg by mouth daily. 04/26/19  Yes [provider]  aspirin 325 MG EC tablet Take 325 mg by mouth daily.   Yes [provider]  atorvastatin (LIPITOR) 40 MG tablet Take 20 mg by mouth at bedtime.    Yes [provider]  carvedilol (COREG) 25 MG tablet TAKE 1 TABLET BY MOUTH 2 TIMES DAILY WITH A MEAL. 12/02/18  Yes Skeet Latch, MD  glipiZIDE (GLUCOTROL XL) 10 MG 24 hr tablet Take 10 mg by mouth 2 (two) times daily.   Yes [provider]  hydrochlorothiazide (MICROZIDE) 12.5 MG capsule TAKE 1 CAPSULE BY MOUTH EVERY DAY 12/14/18  Yes Skeet Latch, MD  lisinopril (ZESTRIL) 40 MG tablet TAKE 1 TABLET BY MOUTH EVERY DAY 10/01/19  Yes Skeet Latch, MD  metFORMIN (GLUCOPHAGE) 500 MG tablet Take 1,000 mg by mouth 2 (two) times daily with a meal.   Yes [provider]  pantoprazole (PROTONIX) 40 MG tablet Take 40 mg by mouth daily.   Yes [provider]  pioglitazone (ACTOS) 30 MG tablet Take 30 mg by mouth daily. 04/03/19  Yes [provider]  sertraline (ZOLOFT) 100 MG tablet Take 100 mg by mouth daily.   Yes [provider]  sitaGLIPtin (JANUVIA) 100 MG tablet Take 100 mg by mouth daily.   Yes [provider]  terazosin (HYTRIN) 5 MG capsule TAKE 1 CAPSULE BY MOUTH EVERYDAY AT BEDTIME 06/26/18  Yes Skeet Latch, MD  fluticasone [facility]) 50 MCG/ACT nasal spray Place 1 spray into both nostrils daily. Patient taking differently: Place 1 spray into both nostrils daily as needed for allergies.  09/19/17   Skeet Latch, MD  Insulin Glargine [facility]) 100 UNIT/ML SMARTSIG:12 Unit(s) SUB-Q Daily 04/14/19   [provider]     Review of Systems  Positive ROS: As above  All other systems have been reviewed and were otherwise negative with the exception of those mentioned in the HPI and as above.  Objective: Vital signs in last 24 hours: Temp:  [97.9 F (36.6 C)] 97.9 F (36.6 C) (04/14  0855) Pulse Rate:  [62] 62 (04/14 0855) Resp:  [18] 18 (04/14 0855) BP: (139)/(62) 139/62 (04/14 0855) SpO2:  [99 %] 99 % (04/14 0855) Weight:  [90.7 kg-95.3 kg] 90.7 kg (04/14 0855) Estimated body mass index is 29.53 kg/m as calculated from the following:   Height as of this encounter: 5\' 9"  (1.753 m).   Weight as of this encounter: 90.7 kg.   General Appearance: Alert Head: Normocephalic, without obvious abnormality, atraumatic Eyes: PERRL, conjunctiva/corneas clear, EOM's intact,    Ears: Normal  Throat: Normal  Neck: Supple, Back: The patient's kyphoplasty incisions are well-healed Lungs: Clear to auscultation bilaterally, respirations unlabored Heart: Regular rate and rhythm, no murmur, rub or gallop Abdomen: Soft, non-tender Extremities: Extremities  normal, atraumatic, no cyanosis or edema Skin: unremarkable  NEUROLOGIC:   Mental status: alert and oriented,Motor Exam - grossly normal Sensory Exam - grossly normal Reflexes:  Coordination - grossly normal Gait -he presents in a wheelchair Balance - grossly normal Cranial Nerves: I: smell Not tested  II: visual acuity  OS: Normal  OD: Normal   II: visual fields Full to confrontation  II: pupils Equal, round, reactive to light  III,VII: ptosis None  III,IV,VI: extraocular muscles  Full ROM  V: mastication Normal  V: facial light touch sensation  Normal  V,VII: corneal reflex  Present  VII: facial muscle function - upper  Normal  VII: facial muscle function - lower Normal  VIII: hearing Not tested  IX: soft palate elevation  Normal  IX,X: gag reflex Present  XI: trapezius strength  5/5  XI: sternocleidomastoid strength 5/5  XI: neck flexion strength  5/5  XII: tongue strength  Normal    Data Review Lab Results  Component Value Date   WBC 8.8 05/11/2020   HGB 11.7 (L) 05/11/2020   HCT 34.8 (L) 05/11/2020   MCV 90.4 05/11/2020   PLT 163 05/11/2020   Lab Results  Component Value Date   NA 141 05/11/2020    K 3.4 (L) 05/11/2020   CL 104 05/11/2020   CO2 28 05/11/2020   BUN 28 (H) 05/11/2020   CREATININE 2.09 (H) 05/11/2020   GLUCOSE 76 05/11/2020   Lab Results  Component Value Date   INR 1.1 (H) 06/08/2010    Assessment/Plan: L4 compression fracture, lumbago, lumbar spinal stenosis, lumbar radiculopathy, neurogenic claudication: I have discussed the situation with the patient.  I reviewed his imaging studies with him and pointed out the abnormalities.  We have discussed the various treatment options including surgery.  I have described the surgical treatment option of an L4-5 decompression, instrumentation and fusion.  I have shown him surgical models.  I have given him a surgical pamphlet.  We have discussed the risk, benefits, alternatives, expected postoperative course, and likelihood of achieving our goals with surgery.  I have answered all his questions.  He has decided to proceed with surgery.   Ophelia Charter 05/11/2020 11:46 AM

## 2020-05-11 NOTE — Progress Notes (Addendum)
Pharmacy Antibiotic Note  Jeffery Fernandez is a 75 y.o. male admitted on 05/11/2020 with L4 compression and spinal stenosis.  Pharmacy has been consulted for vancomycin dosing for surgical prophylaxis. No drain noted. Scr 2.09. CrCl ~34 ml/min.   Plan: Vancomycin 1000mg  x1   Height: 5\' 9"  (175.3 cm) Weight: 90.7 kg (200 lb) IBW/kg (Calculated) : 70.7  Temp (24hrs), Avg:98 F (36.7 C), Min:97.7 F (36.5 C), Max:98.2 F (36.8 C)  Recent Labs  Lab 05/11/20 1023  WBC 8.8  CREATININE 2.09*    Estimated Creatinine Clearance: 33.5 mL/min (A) (by C-G formula based on SCr of 2.09 mg/dL (H)).    Allergies  Allergen Reactions  . Penicillins Other (See Comments)    Possibly rash - not sure Has patient had a PCN reaction causing immediate rash, facial/tongue/throat swelling, SOB or lightheadedness with hypotension:unknown Has patient had a PCN reaction causing severe rash involving mucus membranes or skin necrosis: Unknown Has patient had a PCN reaction that required hospitalization: Unknown Has patient had a PCN reaction occurring within the last 10 years: No Childhood reaction If all of the above answers are "NO", then may proceed with Cephalosporin use.     Antimicrobials this admission:  Dose adjustments this admission: n/a  Microbiology results: 4/14 s. Aureus screen: negative   Thank you for allowing pharmacy to be a part of this patient's care.  Cristela Felt, PharmD Clinical Pharmacist  05/11/2020 6:35 PM

## 2020-05-11 NOTE — Op Note (Signed)
Brief history: The patient is a 75 year old white male who took a fall and has severe back and bilateral leg pain.  He failed medical management.  He was worked up with lumbar x-rays and lumbar MRI which demonstrated an L4 compression fracture with spinal stenosis.  I discussed the various treatment options with him.  He has decided proceed with surgery after weighing the risk, benefits and alternatives.  Preoperative diagnosis: L4 compression fracture with delayed healing, lumbar spinal stenosis with neurogenic claudication, lumbago, lumbar radiculopathy  Postoperative diagnosis: The same  Procedure: Bilateral L4-5 laminotomy/foraminotomies/medial facetectomy to decompress the bilateral L5 nerve roots(the work required to do this was in addition to the work required to do the posterior lumbar interbody fusion because of the patient's spinal stenosis requiring a wide decompression of the nerve roots.);posterior nonsegmental instrumentation from L4 to L5 with globus fenestrated titanium pedicle screws and rods; posterior lateral arthrodesis at L4-5 with local morselized autograft bone, bone morphogenic protein-soaked collagen sponges, and globus bone graft extender  Surgeon: Dr. Earle Gell  Asst.: Dr. Kristeen Miss  Anesthesia: Gen. endotracheal  Estimated blood loss: 150 cc  Drains: None  Complications: None  Description of procedure: The patient was brought to the operating room by the anesthesia team. General endotracheal anesthesia was induced. The patient was turned to the prone position on the Wilson frame. The patient's lumbosacral region was then prepared with Betadine scrub and Betadine solution. Sterile drapes were applied.  I then injected the area to be incised with Marcaine with epinephrine solution. I then used the scalpel to make a linear midline incision over the L4-5 interspace. I then used electrocautery to perform a bilateral subperiosteal dissection exposing the spinous  process and lamina of L4 and L5. We then obtained intraoperative radiograph to confirm our location. We then inserted the Verstrac retractor to provide exposure.  I began the decompression by using the high speed drill to perform laminotomies at L4-5 bilaterally. We then used the Kerrison punches to widen the laminotomy and removed the ligamentum flavum at L4-5 bilaterally. We used the Kerrison punches to remove the medial facets at L4-5 bilaterally. We performed wide foraminotomies about the bilateral L5 nerve roots completing the decompression.  We now turned attention to the instrumentation. Under fluoroscopic guidance we cannulated the bilateral L4 and L5 pedicles with the bone probe. We then removed the bone probe. We then tapped the pedicle with a 6.5 millimeter tap. We then removed the tap. We probed inside the tapped pedicle with a ball probe to rule out cortical breaches. We then inserted a 7.5 x 55 millimeter fenestrated pedicle screw into the L4 and L5 pedicles bilaterally under fluoroscopic guidance. We then palpated along the medial aspect of the pedicles to rule out cortical breaches. There were none. The nerve roots were not injured.  We then injected Kyphon bone cement through the towers into the pedicle screws and into the L4 and L5 vertebral bodies under fluoroscopic guidance.  We then connected the unilateral pedicle screws with a lordotic rod.  We then tightened the caps appropriately. This completed the instrumentation from L4-5 bilaterally.  We now turned our attention to the posterior lateral arthrodesis at L4-5 bilaterally. We used the high-speed drill to decorticate the remainder of the facets, pars, transverse process at L4 and L5. We then applied a combination of local morselized autograft bone, bone morphogenic protein soaked collagen sponges and globus bone graft extender over these decorticated posterior lateral structures. This completed the posterior lateral arthrodesis at  L4-5.  We then obtained hemostasis using bipolar electrocautery. We irrigated the wound out with bacitracin solution. We inspected the thecal sac and nerve roots and noted they were well decompressed. We then removed the retractor.  We injected Exparel . We reapproximated patient's thoracolumbar fascia with interrupted #1 Vicryl suture. We reapproximated patient's subcutaneous tissue with interrupted 2-0 Vicryl suture. The reapproximated patient's skin with Steri-Strips and benzoin. The wound was then coated with bacitracin ointment. A sterile dressing was applied. The drapes were removed. The patient was subsequently returned to the supine position where they were extubated by the anesthesia team. He was then transported to the post anesthesia care unit in stable condition. All sponge instrument and needle counts were reportedly correct at the end of this case.

## 2020-05-11 NOTE — Anesthesia Preprocedure Evaluation (Signed)
Anesthesia Evaluation  Patient identified by MRN, date of birth, ID band Patient awake    Reviewed: Allergy & Precautions, NPO status , Patient's Chart, lab work & pertinent test results  Airway Mallampati: II  TM Distance: >3 FB Neck ROM: Full    Dental  (+) Poor Dentition, Dental Advisory Given,    Pulmonary former smoker,    breath sounds clear to auscultation       Cardiovascular hypertension,  Rhythm:Regular Rate:Normal     Neuro/Psych    GI/Hepatic   Endo/Other  diabetes  Renal/GU      Musculoskeletal   Abdominal (+) + obese,   Peds  Hematology   Anesthesia Other Findings   Reproductive/Obstetrics                             Anesthesia Physical Anesthesia Plan  ASA: III  Anesthesia Plan: General   Post-op Pain Management:    Induction: Intravenous  PONV Risk Score and Plan: Ondansetron and Dexamethasone  Airway Management Planned: Oral ETT  Additional Equipment:   Intra-op Plan:   Post-operative Plan: Extubation in OR  Informed Consent: I have reviewed the patients History and Physical, chart, labs and discussed the procedure including the risks, benefits and alternatives for the proposed anesthesia with the patient or authorized representative who has indicated his/her understanding and acceptance.     Dental advisory given  Plan Discussed with: CRNA and Anesthesiologist  Anesthesia Plan Comments:         Anesthesia Quick Evaluation

## 2020-05-11 NOTE — Anesthesia Procedure Notes (Signed)
Procedure Name: Intubation Date/Time: 05/11/2020 12:31 PM Performed by: Moshe Salisbury, CRNA Pre-anesthesia Checklist: Patient identified, Emergency Drugs available, Suction available and Patient being monitored Patient Re-evaluated:Patient Re-evaluated prior to induction Oxygen Delivery Method: Circle System Utilized Preoxygenation: Pre-oxygenation with 100% oxygen Induction Type: IV induction Ventilation: Mask ventilation without difficulty Laryngoscope Size: Mac and 4 Grade View: Grade II Tube type: Oral Tube size: 8.0 mm Number of attempts: 1 Airway Equipment and Method: Stylet Placement Confirmation: ETT inserted through vocal cords under direct vision,  positive ETCO2 and breath sounds checked- equal and bilateral Secured at: 22 cm Tube secured with: Tape Dental Injury: Teeth and Oropharynx as per pre-operative assessment

## 2020-05-12 ENCOUNTER — Other Ambulatory Visit: Payer: Self-pay

## 2020-05-12 LAB — CBC
HCT: 29.3 % — ABNORMAL LOW (ref 39.0–52.0)
Hemoglobin: 10 g/dL — ABNORMAL LOW (ref 13.0–17.0)
MCH: 30.1 pg (ref 26.0–34.0)
MCHC: 34.1 g/dL (ref 30.0–36.0)
MCV: 88.3 fL (ref 80.0–100.0)
Platelets: 138 10*3/uL — ABNORMAL LOW (ref 150–400)
RBC: 3.32 MIL/uL — ABNORMAL LOW (ref 4.22–5.81)
RDW: 13.2 % (ref 11.5–15.5)
WBC: 8.4 10*3/uL (ref 4.0–10.5)
nRBC: 0 % (ref 0.0–0.2)

## 2020-05-12 LAB — BASIC METABOLIC PANEL
Anion gap: 7 (ref 5–15)
BUN: 30 mg/dL — ABNORMAL HIGH (ref 8–23)
CO2: 24 mmol/L (ref 22–32)
Calcium: 8.3 mg/dL — ABNORMAL LOW (ref 8.9–10.3)
Chloride: 103 mmol/L (ref 98–111)
Creatinine, Ser: 2.08 mg/dL — ABNORMAL HIGH (ref 0.61–1.24)
GFR, Estimated: 32 mL/min — ABNORMAL LOW (ref >60)
Glucose, Bld: 234 mg/dL — ABNORMAL HIGH (ref 70–99)
Potassium: 3.6 mmol/L (ref 3.5–5.1)
Sodium: 134 mmol/L — ABNORMAL LOW (ref 135–145)

## 2020-05-12 LAB — GLUCOSE, CAPILLARY
Glucose-Capillary: 206 mg/dL — ABNORMAL HIGH (ref 70–99)
Glucose-Capillary: 199 mg/dL — ABNORMAL HIGH (ref 70–99)
Glucose-Capillary: 208 mg/dL — ABNORMAL HIGH (ref 70–99)
Glucose-Capillary: 152 mg/dL — ABNORMAL HIGH (ref 70–99)
Glucose-Capillary: 130 mg/dL — ABNORMAL HIGH (ref 70–99)

## 2020-05-12 MED ORDER — ALUM & MAG HYDROXIDE-SIMETH 200-200-20 MG/5ML PO SUSP
30.0000 mL | Freq: Four times a day (QID) | ORAL | Status: DC | PRN
  Administered 2020-05-12: 30 mL via ORAL
  Filled 2020-05-12: qty 30

## 2020-05-12 MED FILL — Thrombin For Soln 5000 Unit: CUTANEOUS | Qty: 5000 | Status: AC

## 2020-05-12 NOTE — Progress Notes (Signed)
Patient ID: Jeffery Fernandez, male   DOB: November 13, 1945, 75 y.o.   MRN: [id number] All signs are stable.  Patient notes that he feels reasonably well. Wife and daughter have concerns about his mobility They note he has to get up 4 stairs to walk into the house They are uncertain of his capacity to do that today His incision is clean and dry Motor function appears intact In deference to the concerns of the wife and daughter I have advised that he stay 1 more day we will have physical therapy check him out on stairs.  He will likely be discharged tomorrow.

## 2020-05-12 NOTE — TOC Transition Note (Signed)
Transition of Care [facility]) - CM/SW Discharge Note   Patient Details  Name: Jeffery Fernandez MRN: [id number] Date of Birth: 1945-08-22  Transition of Care [facility]) CM/SW Contact:  Pollie Friar, RN Phone Number: 05/12/2020, 12:45 PM   Clinical Narrative:    Pt with recommendations for [facility] services. CM met with the patient and his spouse. They have no preference for [facility] agency. Pt set up with [facility].  Pt has all needed DME at home.  Wife is able to provide needed supervision.  Pt has transport home when medically ready.   Final next level of care: Home w Home Health Services Barriers to Discharge: No Barriers Identified   Patient Goals and CMS Choice   CMS Medicare.gov Compare Post Acute Care list provided to:: Patient Choice offered to / list presented to : [facility]  Discharge Placement                       Discharge Plan and Services                          HH Arranged: PT [facility] Agency: [place] Date [facility] Agency Contacted: 05/12/20   Representative spoke with at [place]: [place] ([place]) Interventions     Readmission Risk Interventions No flowsheet data found.

## 2020-05-12 NOTE — Progress Notes (Signed)
Spoke with patient's wife on the phone about patient's current diabetes medications. Wife read the list of his current medications, including the diabetes medications. The current orders for diabetes medications do not match what the patient takes at home. Spoke with staff RN about the medications.   Current diabetes medications taken at home: Lantus 40 units daily (has been on insulin for less than a year) Glucotrol 10 mg BID   Recommend discontinuing the Actos and Tradjenta that has been ordered. Recommend discontinuing Glipizide while in the hospital.   Recommend starting Lantus 20 units daily and change Novolog correction scale to SENSITIVE (0-9 units) TID with meals.    Harvel Ricks RN BSN CDE Diabetes Coordinator Pager: [phone]  8am-5pm

## 2020-05-12 NOTE — Plan of Care (Signed)

## 2020-05-12 NOTE — Evaluation (Signed)
Occupational Therapy Evaluation Patient Details Name: Jeffery Fernandez MRN: [id number] DOB: 09/17/1945 Today's Date: 05/12/2020    History of Present Illness 75 y/o male who recently had fall and has had increasing back and bilateral leg pain. MRI found L4 compression fx with spinal stenosis. Patient now s/p L4-5 decompression and fusion on 4/14. PMH: CKD, depression, DM, HLD, HTN, OSA, nephrolithiasis.   Clinical Impression   Pt admitted with above. He demonstrates the below listed deficits and will benefit from continued OT to maximize safety and independence with BADLs.  Pt presents to OT with generalized weakness, decreased activity tolerance, increased pain, decreased knowledge of precautions. He was limited during eval by increased back pain.  He requires set up assist - max A for ADLs and min cues for back precautions.  He lives with his wife, who is very supportive.  He has required assist for LB ADLs since falling 2 weeks ago. Will follow acutely. l      Follow Up Recommendations  No OT follow up;Supervision/Assistance - 24 hour    Equipment Recommendations  None recommended by OT    Recommendations for Other Services       Precautions / Restrictions Precautions Precautions: Fall;Back Precaution Booklet Issued: No Precaution Comments: Pt able to recall 2/3 precautions independently and required min cues from wife to recall "no bending" Required Braces or Orthoses: Spinal Brace Spinal Brace: Lumbar corset;Applied in sitting position Restrictions Weight Bearing Restrictions: No      Mobility Bed Mobility Overal bed mobility: Needs Assistance Bed Mobility: Rolling;Sidelying to Sit;Sit to Sidelying Rolling: Min guard Sidelying to sit: Min assist     Sit to sidelying: Mod assist General bed mobility comments: verbal cues for technique and assist to lift shoulders.  Upon sitting, pt very restless with increased pain and was assisted back to sidelying >supine due to pain  10/10 with sitting    Transfers Overall transfer level: Needs assistance Equipment used: None;Rolling walker (2 wheeled) Transfers: Sit to/from Stand Sit to Stand: Min guard         General transfer comment: min guard for safety. Cues for hand placement but no physical assist required.    Balance Overall balance assessment: Mild deficits observed, not formally tested                                         ADL either performed or assessed with clinical judgement   ADL Overall ADL's : Needs assistance/impaired Eating/Feeding: Independent   Grooming: Wash/dry hands;Wash/dry face;Oral care;Set up;Bed level   Upper Body Bathing: Set up;Bed level   Lower Body Bathing: Maximal assistance;Bed level   Upper Body Dressing : Moderate assistance;Bed level   Lower Body Dressing: Maximal assistance;Bed level Lower Body Dressing Details (indicate cue type and reason): Pt unable to perform figure 4. Pt moved to EOB sitting, but pain increased to 10/10 so he was assisted back to supine Toilet Transfer: Total assistance Toilet Transfer Details (indicate cue type and reason): unable to perform at this time due to increased pain Toileting- Clothing Manipulation and Hygiene: Maximal assistance;Bed level               Vision Baseline Vision/History: Wears glasses Wears Glasses: At all times Patient Visual Report: No change from baseline       Perception     Praxis      Pertinent Vitals/Pain Pain Assessment: 0-10 Faces Pain  Scale: Hurts even more Pain Location: back Pain Descriptors / Indicators: Grimacing;Guarding Pain Intervention(s): Limited activity within patient's tolerance;Monitored during session;Repositioned;Patient requesting pain meds-RN notified;RN gave pain meds during session     Hand Dominance Right   Extremity/Trunk Assessment Upper Extremity Assessment Upper Extremity Assessment: Overall WFL for tasks assessed   Lower Extremity  Assessment Lower Extremity Assessment: Defer to PT evaluation   Cervical / Trunk Assessment Cervical / Trunk Assessment: Other exceptions Cervical / Trunk Exceptions: s/p lumbar fusion   Communication Communication Communication: HOH   Cognition Arousal/Alertness: Awake/alert Behavior During Therapy: WFL for tasks assessed/performed Overall Cognitive Status: Within Functional Limits for tasks assessed                                 General Comments: Pt requires cues to recall precautions   General Comments  wife present and very supportive    Exercises     Shoulder Instructions      Home Living Family/patient expects to be discharged to:: Private residence Living Arrangements: Spouse/significant other Available Help at Discharge: Family;Available 24 hours/day Type of Home: House Home Access: Stairs to enter [employer] of Steps: 5-6 Entrance Stairs-Rails: Right;Left Home Layout: One level     Bathroom Shower/Tub: Walk-in shower;Tub/shower unit   Bathroom Toilet: Handicapped height     Home Equipment: Toilet riser;Walker - 2 wheels;Cane - single point;Grab bars - toilet;Shower seat          Prior Functioning/Environment Level of Independence: Independent;Needs assistance  Gait / Transfers Assistance Needed: Per wife, pt has been using RW of late, when ambulating ADL's / Homemaking Assistance Needed: Wife has been assisting with LB ADLs since his fall ~2 weeks ago. Pt does not drive   Comments: prior to mobility, patient states he did not use RW. Upon ambulation, patient states he used RW        OT Problem List: Decreased activity tolerance;Impaired balance (sitting and/or standing);Decreased safety awareness;Decreased knowledge of use of DME or AE;Decreased knowledge of precautions;Obesity;Pain      OT Treatment/Interventions: Self-care/ADL training;DME and/or AE instruction;Therapeutic activities;Cognitive  remediation/compensation;Patient/family education;Balance training    OT Goals(Current goals can be found in the care plan section) Acute Rehab OT Goals Patient Stated Goal: to go home OT Goal Formulation: With patient/family Time For Goal Achievement: 05/26/20 Potential to Achieve Goals: Good ADL Goals Pt Will Perform Grooming: with supervision;standing Pt Will Perform Upper Body Bathing: with supervision;sitting Pt Will Perform Lower Body Bathing: with min assist;with adaptive equipment;sit to/from stand Pt Will Perform Upper Body Dressing: with supervision;sitting Pt Will Perform Lower Body Dressing: with min assist;with adaptive equipment;sit to/from stand Pt Will Transfer to Toilet: with supervision;ambulating;regular height toilet;bedside commode;grab bars Pt Will Perform Toileting - Clothing Manipulation and hygiene: with supervision;sit to/from stand Pt Will Perform Tub/Shower Transfer: Tub transfer;with min guard assist;ambulating;shower seat  OT Frequency: Min 2X/week   Barriers to D/C:            Co-evaluation              AM-PAC OT "6 Clicks" Daily Activity     Outcome Measure Help from another person eating meals?: None Help from another person taking care of personal grooming?: A Little Help from another person toileting, which includes using toliet, bedpan, or urinal?: A Lot Help from another person bathing (including washing, rinsing, drying)?: A Lot Help from another person to put on and taking off regular upper body clothing?:  A Lot Help from another person to put on and taking off regular lower body clothing?: A Lot 6 Click Score: 15   End of Session Nurse Communication: Mobility status;Patient requests pain meds;Precautions  Activity Tolerance: Patient limited by pain Patient left: in bed;with call bell/phone within reach;with bed alarm set;with family/visitor present  OT Visit Diagnosis: Unsteadiness on feet (R26.81);Pain;Cognitive communication  deficit (R41.841) Pain - part of body:  (back)                Time: 3361-2244 OT Time Calculation (min): 22 min Charges:  OT General Charges $OT Visit: 1 Visit OT Evaluation $OT Eval Moderate Complexity: 1 Mod  Jeffery Fernandez., OTR/L Acute Rehabilitation Services Pager [phone] Office [phone]   Lucille Passy M 05/12/2020, 11:44 AM

## 2020-05-12 NOTE — Evaluation (Signed)
Physical Therapy Evaluation Patient Details Name: Jeffery Fernandez MRN: [id number] DOB: 10-06-1945 Today's Date: 05/12/2020   History of Present Illness  75 y/o male who recently had fall and has had increasing back and bilateral leg pain. MRI found L4 compression fx with spinal stenosis. Patient now s/p L4-5 decompression and fusion on 4/14. PMH: CKD, depression, DM, HLD, HTN, OSA, nephrolithiasis.  Clinical Impression  PTA, patient lives with wife and reports independence with mobility, however states during session that he uses RW after stating he did not use AD. Patient presents with decreased activity tolerance, impaired balance, generalized weakness. Educated patient on back precautions and reinforced throughout. Patient with STM deficits as patient unable to repeat back precautions 5 minutes after this therapist educated him on them. Patient overall min guard for mobility with use of RW. Patient will benefit from skilled PT services during acute stay to address listed deficits. Recommend HHPT following discharge to maximize functional mobility and safety.      Follow Up Recommendations Home health PT;Supervision for mobility/OOB    Equipment Recommendations  None recommended by PT (patient owns necessary DME)    Recommendations for Other Services       Precautions / Restrictions Precautions Precautions: Fall;Back Precaution Booklet Issued: No Precaution Comments: verbally reviewed back precautions x 3 throughout session with patient able to recall at most 2/3 at end of session with cueing Required Braces or Orthoses: Spinal Brace Spinal Brace: Lumbar corset;Applied in sitting position Restrictions Weight Bearing Restrictions: No      Mobility  Bed Mobility Overal bed mobility: Needs Assistance Bed Mobility: Rolling;Sidelying to Sit;Sit to Sidelying Rolling: Min guard Sidelying to sit: Min guard     Sit to sidelying: Min guard General bed mobility comments: min guard for  safety. Step by step instructions on log roll technique. Difficulty maintaining back precautions with return to supine    Transfers Overall transfer level: Needs assistance Equipment used: None;Rolling Terrill Alperin (2 wheeled) Transfers: Sit to/from Stand Sit to Stand: Min guard         General transfer comment: min guard for safety. Cues for hand placement but no physical assist required.  Ambulation/Gait Ambulation/Gait assistance: Min guard Gait Distance (Feet): 200 Feet Assistive device: None;Rolling Doreena Maulden (2 wheeled) Gait Pattern/deviations: Step-through pattern;Decreased stride length;Wide base of support Gait velocity: decreased   General Gait Details: Initially with no AD, patient demos slow guarded gait. Provided RW with patient demos improved gait quality and speed. Cues throughout to maintain close proximity with RW and upright posture.  Stairs Stairs: Yes Stairs assistance: Min guard Stair Management: One rail Right;Step to pattern;Forwards Number of Stairs: 2 General stair comments: instructed patient on safe stair negotiation and use of single hand rail to simulate home environment  Wheelchair Mobility    Modified Rankin (Stroke Patients Only)       Balance Overall balance assessment: Mild deficits observed, not formally tested                                           Pertinent Vitals/Pain Pain Assessment: Faces Faces Pain Scale: Hurts even more Pain Location: back Pain Descriptors / Indicators: Grimacing;Guarding Pain Intervention(s): Monitored during session;Repositioned;Limited activity within patient's tolerance    Home Living Family/patient expects to be discharged to:: Private residence Living Arrangements: Spouse/significant other Available Help at Discharge: Family;Available 24 hours/day Type of Home: House Home Access: Stairs to enter  Entrance Stairs-Rails: Right;Left Entrance Stairs-Number of Steps: 5-6 Home Layout: One  level Home Equipment: Toilet riser;Crystina Borrayo - 2 wheels;Cane - single point;Grab bars - toilet      Prior Function Level of Independence: Independent         Comments: prior to mobility, patient states he did not use RW. Upon ambulation, patient states he used RW     Hand Dominance        Extremity/Trunk Assessment   Upper Extremity Assessment Upper Extremity Assessment: Defer to OT evaluation    Lower Extremity Assessment Lower Extremity Assessment: Generalized weakness (L weaker than R)    Cervical / Trunk Assessment Cervical / Trunk Assessment: Other exceptions Cervical / Trunk Exceptions: s/p lumbar fusion  Communication   Communication: No difficulties  Cognition Arousal/Alertness: Awake/alert Behavior During Therapy: WFL for tasks assessed/performed Overall Cognitive Status: Within Functional Limits for tasks assessed                                 General Comments: STM deficits present at beginning of session with patient unable to repeat all back precautions 5 minutes after this therapist reviewed them.      General Comments      Exercises     Assessment/Plan    PT Assessment Patient needs continued PT services  PT Problem List Decreased strength;Decreased activity tolerance;Decreased balance;Decreased mobility;Decreased knowledge of use of DME       PT Treatment Interventions DME instruction;Gait training;Stair training;Functional mobility training;Balance training;Therapeutic activities;Therapeutic exercise;Patient/family education    PT Goals (Current goals can be found in the Care Plan section)  Acute Rehab PT Goals Patient Stated Goal: to go home PT Goal Formulation: With patient Time For Goal Achievement: 05/26/20 Potential to Achieve Goals: Good    Frequency Min 5X/week   Barriers to discharge        Co-evaluation               AM-PAC PT "6 Clicks" Mobility  Outcome Measure Help needed turning from your back to  your side while in a flat bed without using bedrails?: A Little Help needed moving from lying on your back to sitting on the side of a flat bed without using bedrails?: A Little Help needed moving to and from a bed to a chair (including a wheelchair)?: A Little Help needed standing up from a chair using your arms (e.g., wheelchair or bedside chair)?: A Little Help needed to walk in hospital room?: A Little Help needed climbing 3-5 steps with a railing? : A Little 6 Click Score: 18    End of Session Equipment Utilized During Treatment: Gait belt;Back brace Activity Tolerance: Patient tolerated treatment well Patient left: in bed;with call bell/phone within reach;with bed alarm set;with family/visitor present Nurse Communication: Mobility status PT Visit Diagnosis: Unsteadiness on feet (R26.81);Muscle weakness (generalized) (M62.81);History of falling (Z91.81)    Time: 4827-0786 PT Time Calculation (min) (ACUTE ONLY): 32 min   Charges:   PT Evaluation $PT Eval Low Complexity: 1 Low PT Treatments $Therapeutic Activity: 8-22 mins        Takila Kronberg A. Gilford Rile PT, DPT Acute Rehabilitation Services Pager [phone] Office [phone]   Linna Hoff 05/12/2020, 10:12 AM

## 2020-05-13 ENCOUNTER — Inpatient Hospital Stay (HOSPITAL_COMMUNITY): Payer: Medicare HMO

## 2020-05-13 LAB — GLUCOSE, CAPILLARY
Glucose-Capillary: 105 mg/dL — ABNORMAL HIGH (ref 70–99)
Glucose-Capillary: 106 mg/dL — ABNORMAL HIGH (ref 70–99)
Glucose-Capillary: 118 mg/dL — ABNORMAL HIGH (ref 70–99)
Glucose-Capillary: 142 mg/dL — ABNORMAL HIGH (ref 70–99)
Glucose-Capillary: 87 mg/dL (ref 70–99)
Glucose-Capillary: 89 mg/dL (ref 70–99)
Glucose-Capillary: 97 mg/dL (ref 70–99)

## 2020-05-13 MED ORDER — ENSURE ENLIVE PO LIQD
237.0000 mL | Freq: Two times a day (BID) | ORAL | Status: DC
  Administered 2020-05-13 – 2020-05-19 (×10): 237 mL via ORAL
  Filled 2020-05-13 (×5): qty 237

## 2020-05-13 MED ORDER — POLYETHYLENE GLYCOL 3350 17 G PO PACK
17.0000 g | PACK | Freq: Every day | ORAL | Status: DC | PRN
  Administered 2020-05-13 – 2020-05-14 (×2): 17 g via ORAL
  Filled 2020-05-13 (×2): qty 1

## 2020-05-13 NOTE — Progress Notes (Signed)
PT Cancellation Note  Patient Details Name: Jeffery Fernandez MRN: [id number] DOB: September 05, 1945   Cancelled Treatment:    Reason Eval/Treat Not Completed: Medical issues which prohibited therapy Patient increasingly lethargic and per RN, hold therapy until evaluated by neurosurgery. PT will re-attempt as time allows and as patient is appropriate.   Soniya Ashraf A. Gilford Rile PT, DPT Acute Rehabilitation Services Pager [phone] Office [phone]    Linna Hoff 05/13/2020, 3:32 PM

## 2020-05-13 NOTE — Progress Notes (Signed)
Patient ID: Jeffery Fernandez, male   DOB: 23-Aug-1945, 75 y.o.   MRN: [id number] Head CT is normal. Will monitor.  Awakened easily this evening when I gave him the results.

## 2020-05-13 NOTE — Progress Notes (Signed)
Arrived to start PIV per consult, patient is not in room.  Spoke with RN and requested that request be re entered once he returns to room and if he still needs it.

## 2020-05-13 NOTE — Progress Notes (Signed)
Occupational Therapy Treatment Patient Details Name: Jeffery Fernandez MRN: [id number] DOB: 08-24-1945 Today's Date: 05/13/2020    History of present illness 75 y/o male who recently had fall and has had increasing back and bilateral leg pain. MRI found L4 compression fx with spinal stenosis. Patient now s/p L4-5 decompression and fusion on 4/14. PMH: CKD, depression, DM, HLD, HTN, OSA, nephrolithiasis.   OT comments  Pt. Seen for skilled OT treatment session. Noted to present very differently from previous PT/OT sessions the day before.  Pt. Was max/total for bed mobility and was unable to maintain supported and un supported sitting eob.  Cont. To fall to L side without warning if not supported heavily.  Assisted back to bed for safety and to aide in clean up which had to be performed bed level total a. Following commands inconsistently or not at all.  Not answering questions but would state he did hear the question.  Incontinent of urine x2, with announcement of needing to "pee" but would not initiate asking for bsc or urinal.  Would just state "im going here it comes".  Wife present for session and reports he did not have incontinence issues prior to admission.   Spoke with rn at length regarding pts. Presentation during my session.  She states she will be contacting MD.  Will notify OTR/L of need for possible follow up recommendations to be updated if pt. Remains requiring this level of assistance with mobility and adls.    Follow Up Recommendations  SNF    Equipment Recommendations  Other (comment) (may need, will defer to next venue)    Recommendations for Other Services      Precautions / Restrictions Precautions Precautions: Fall;Back Required Braces or Orthoses: Spinal Brace Spinal Brace: Lumbar corset;Applied in sitting position       Mobility Bed Mobility Overal bed mobility: Needs Assistance Bed Mobility: Rolling;Sidelying to Sit;Sit to Sidelying Rolling: Max assist Sidelying to  sit: Max assist;Total assist     Sit to sidelying: Max assist General bed mobility comments: pt. unable to maintain sitting. cont. to collapse onto left side/arm.  no weight bearing through R side/hip. multiple attempts to assist with positioning.  pt. not directly showing or c/o pain but unable to sit. total a for trunk support and if i moved he would fall to side.  asssited back to bed total a.  max/total a for remaining bed mobility and positioning.  increased time with inconsistency with following cues for log roll in/out of bed.  would partially initiate the movement ie: bending knees but not sustain, or reach to start roll then stop reaching and roll back.    Transfers                      Balance Overall balance assessment: Mild deficits observed, not formally tested                                         ADL either performed or assessed with clinical judgement   ADL Overall ADL's : Needs assistance/impaired     Grooming: Wash/dry face;Set up;Bed level       Lower Body Bathing: Total assistance;Bed level Lower Body Bathing Details (indicate cue type and reason): pt. incontinent of bladder upon arrival, then had 2nd occurance before i could provide clean up from initial episode.  had to provide care bed level  with pt. rolling l/r Upper Body Dressing : Set up;Bed level Upper Body Dressing Details (indicate cue type and reason): able to don clean gown with instructions for arm holes                   General ADL Comments: pt. unable to tolerate particpation in adls secondary to physical limitations ie: un controlable falling to the L side, also following instructions inconsistently. max/total a. placed back in bed to continue care.     Vision       Perception     Praxis      Cognition Arousal/Alertness: Lethargic Behavior During Therapy: Flat affect Overall Cognitive Status: Difficult to assess                                  General Comments: not answering questions on 1st attempt. mumbling or closing eyes. when you ask "did you hear me" he would reply "yes" but then not answer the question. wife would do the same and he would not answer her questions either.  inconsistency or inability to follow one step commands either        Exercises     Shoulder Instructions       General Comments pt. cont. to fall to L side in sitting multiple times, required heavy assistance not to fall to side.  Wife present and very active in his care.  Reports they have a dtr. That will be staying with them that is an rn.  Also states they watch their twin 46 year old great grandchildren 3 days a week.      Pertinent Vitals/ Pain       Pain Assessment: Faces Faces Pain Scale: Hurts little more Pain Location: Lower R abdomen-required asking multiple times. pt. closing eyes, mumbling but not answering the question x3 Pain Intervention(s): Repositioned  Home Living                                          Prior Functioning/Environment              Frequency  Min 2X/week        Progress Toward Goals  OT Goals(current goals can now be found in the care plan section)  Progress towards OT goals: Not progressing toward goals - comment (physical limitations today limited participation/progress)     Plan Discharge plan needs to be updated    Co-evaluation                 AM-PAC OT "6 Clicks" Daily Activity     Outcome Measure   Help from another person eating meals?: None Help from another person taking care of personal grooming?: A Little Help from another person toileting, which includes using toliet, bedpan, or urinal?: A Lot Help from another person bathing (including washing, rinsing, drying)?: A Lot Help from another person to put on and taking off regular upper body clothing?: A Lot Help from another person to put on and taking off regular lower body clothing?: A Lot 6 Click  Score: 15    End of Session    OT Visit Diagnosis: Unsteadiness on feet (R26.81);Pain;Cognitive communication deficit (R41.841)   Activity Tolerance Other (comment) (limited by physical barriers-unable to achieve sitting. requiring more than one person assist. pt. could not tolerate)  Patient Left in bed;with call bell/phone within reach;with bed alarm set;with family/visitor present   Nurse Communication Other (comment) (long conversation with RN via phone after session.  reported physical presentation of pt. in comparison to previous day.  including incontinence that pt. and wife state was not present before sx. she states she will page md)        Time: 9179-2178 OT Time Calculation (min): 31 min  Charges: OT General Charges $OT Visit: 1 Visit OT Treatments $Self Care/Home Management : 23-37 mins  Sonia Baller, COTA/L Acute Rehabilitation [phone]  05/13/2020, 12:59 PM

## 2020-05-13 NOTE — Progress Notes (Signed)
Patient was lethargic this morning upon shift assessment, but still following commands and oriented. Patient worked with PT yesterday and stated that they were tired. Patient was seen by OT this morning and was noted to present differently from yesterday. This nurse was at bedside just after OT finished seeing patient and patient was more alert, and still oriented. Patient has become more lethargic again this afternoon, Neuro assessment was the same otherwise, MD notified.

## 2020-05-13 NOTE — Plan of Care (Signed)
  Problem: Education: Goal: Knowledge of General Education information will improve Description: Including pain rating scale, medication(s)/side effects and non-pharmacologic comfort measures Outcome: Progressing   Problem: Clinical Measurements: Goal: Will remain free from infection Outcome: Progressing Goal: Diagnostic test results will improve Outcome: Progressing Goal: Respiratory complications will improve Outcome: Progressing Goal: Cardiovascular complication will be avoided Outcome: Progressing   Problem: Coping: Goal: Level of anxiety will decrease Outcome: Progressing   Problem: Elimination: Goal: Will not experience complications related to urinary retention Outcome: Progressing   Problem: Pain Managment: Goal: General experience of comfort will improve Outcome: Progressing   Problem: Safety: Goal: Ability to remain free from injury will improve Outcome: Progressing   Problem: Skin Integrity: Goal: Risk for impaired skin integrity will decrease Outcome: Progressing

## 2020-05-13 NOTE — Progress Notes (Signed)
Patient ID: Jeffery Fernandez, male   DOB: [birth year], 75 y.o.   MRN: [id number] BP 123/63 (BP Location: Left Arm)   Pulse 84   Temp 99.3 F (37.4 C) (Oral)   Resp 16   Ht 5\' 9"  (1.753 m)   Wt 90.7 kg   SpO2 93%   BMI 29.53 kg/m  Lethargic, arouses quickly to voice, following all commands Speech is dysarthric Nurse and daughter feel he is more lethargic today than yesterday. Not as active, not eating.  Moving all extremities Will order head CT

## 2020-05-14 LAB — GLUCOSE, CAPILLARY
Glucose-Capillary: 75 mg/dL (ref 70–99)
Glucose-Capillary: 70 mg/dL (ref 70–99)
Glucose-Capillary: 126 mg/dL — ABNORMAL HIGH (ref 70–99)
Glucose-Capillary: 131 mg/dL — ABNORMAL HIGH (ref 70–99)
Glucose-Capillary: 131 mg/dL — ABNORMAL HIGH (ref 70–99)
Glucose-Capillary: 88 mg/dL (ref 70–99)

## 2020-05-14 MED ORDER — ADULT MULTIVITAMIN W/MINERALS CH
1.0000 | ORAL_TABLET | Freq: Every day | ORAL | Status: DC
  Administered 2020-05-15 – 2020-05-19 (×5): 1 via ORAL
  Filled 2020-05-14 (×5): qty 1

## 2020-05-14 NOTE — Progress Notes (Signed)
Physical Therapy Treatment Patient Details Name: Jeffery Fernandez MRN: [id number] DOB: 1945-03-11 Today's Date: 05/14/2020    History of Present Illness 75 y/o male who recently had fall and has had increasing back and bilateral leg pain. MRI found L4 compression fx with spinal stenosis. Patient now s/p L4-5 decompression and fusion on 4/14. 4/16 somnolent, confused; head CT no acute changes  PMH: CKD, depression, DM, HLD, HTN, OSA, nephrolithiasis.    PT Comments    Patient with significant decline in function since evaluation 4/15. Per nursing, his lethargy and confusion are better today than they were 4/16, however he is not back to baseline and remained lethargic with frequent cues to open his eyes during session. He required assist to maintain his sitting and standing balance (posterior bias) and was not safe to ambulate due to degree of imbalance, lethargy, and confusion. Discharge plan updated as wife cannot manage him at current funtional level (2 person up to max assist).     Follow Up Recommendations  SNF;Supervision/Assistance - 24 hour (24/7 due to decr cognition)     Equipment Recommendations  None recommended by PT (patient owns necessary DME)    Recommendations for Other Services       Precautions / Restrictions Precautions Precautions: Fall;Back Precaution Booklet Issued: No Precaution Comments: pt could not recall any back precautions Required Braces or Orthoses: Spinal Brace Spinal Brace: Lumbar corset;Applied in sitting position    Mobility  Bed Mobility Overal bed mobility: Needs Assistance Bed Mobility: Rolling;Sidelying to Sit;Sit to Sidelying Rolling: Max assist Sidelying to sit: Mod assist     Sit to sidelying: Max assist General bed mobility comments: pt. unable to maintain sitting due to posterior imbalance. pt could identify that he was leaning backwards, however even with bil UE support he could not correct for longer than 2-3 seconds. Ultimately  returned to bed and pt began rolling onto his back before his legs could be elevated onto the bed, landing pt in a very diagonal position. incr time to help pt get straight in the bed    Transfers Overall transfer level: Needs assistance Equipment used: Rolling walker (2 wheeled) Transfers: Sit to/from Stand Sit to Stand: Min guard;Mod assist;+2 safety/equipment         General transfer comment: pt with strong posterior bias  Ambulation/Gait             General Gait Details: unsafe to ambulate with +1 assist   Stairs             Wheelchair Mobility    Modified Rankin (Stroke Patients Only)       Balance Overall balance assessment: Needs assistance   Sitting balance-Leahy Scale: Poor       Standing balance-Leahy Scale: Poor                              Cognition Arousal/Alertness: Lethargic (difficulty keeping eyes open) Behavior During Therapy: Flat affect Overall Cognitive Status: Impaired/Different from baseline Area of Impairment: Orientation;Attention;Memory;Following commands;Awareness;Problem solving                 Orientation Level: Place (thought he was in [place]) Current Attention Level: Sustained Memory: Decreased recall of precautions;Decreased short-term memory Following Commands: Follows one step commands inconsistently;Follows one step commands with increased time   Awareness: Intellectual Problem Solving: Slow processing;Decreased initiation;Difficulty sequencing;Requires verbal cues;Requires tactile cues General Comments: per RN, cognition is better than yesterday; requires multi-modal cues and  at times hand-over-hand assist to follow instruction; wife prsent and concerned that he should not go home like this      Exercises      General Comments General comments (skin integrity, edema, etc.): Wife present throughout session. Expressing concern that she cannot manage pt at home like this. In  agreement with recommendation for SNF      Pertinent Vitals/Pain Pain Assessment: Faces Faces Pain Scale: Hurts little more Pain Location: Lower R abdomen Pain Descriptors / Indicators: Grimacing;Guarding Pain Intervention(s): Limited activity within patient's tolerance;Monitored during session;Premedicated before session    Home Living                      Prior Function            PT Goals (current goals can now be found in the care plan section) Acute Rehab PT Goals Patient Stated Goal: to go home Time For Goal Achievement: 05/26/20 Potential to Achieve Goals: Good Progress towards PT goals: Not progressing toward goals - comment    Frequency    Min 5X/week      PT Plan Discharge plan needs to be updated    Co-evaluation              AM-PAC PT "6 Clicks" Mobility   Outcome Measure  Help needed turning from your back to your side while in a flat bed without using bedrails?: A Little Help needed moving from lying on your back to sitting on the side of a flat bed without using bedrails?: Total Help needed moving to and from a bed to a chair (including a wheelchair)?: Total Help needed standing up from a chair using your arms (e.g., wheelchair or bedside chair)?: Total Help needed to walk in hospital room?: Total Help needed climbing 3-5 steps with a railing? : Total 6 Click Score: 8    End of Session Equipment Utilized During Treatment: Gait belt;Back brace Activity Tolerance: Patient limited by lethargy Patient left: in bed;with call bell/phone within reach;with bed alarm set;with family/visitor present (in brace in chair position) Nurse Communication: Mobility status;Other (comment) (RN present during session) PT Visit Diagnosis: Unsteadiness on feet (R26.81);Muscle weakness (generalized) (M62.81);History of falling (Z91.81)     Time: 0349-1791 PT Time Calculation (min) (ACUTE ONLY): 31 min  Charges:  $Therapeutic Activity: 23-37 mins                       Arby Barrette, PT Pager [phone]    Rexanne Mano 05/14/2020, 2:04 PM

## 2020-05-14 NOTE — Plan of Care (Signed)

## 2020-05-14 NOTE — Progress Notes (Signed)
Patient ID: Jeffery Fernandez, male   DOB: 1945/09/10, 75 y.o.   MRN: [id number] BP 120/68   Pulse 75   Temp 97.9 F (36.6 C) (Oral)   Resp 18   Ht 5\' 9"  (1.753 m)   Wt 90.7 kg   SpO2 94%   BMI 29.53 kg/m  Alert oriented x4 Speech is clear and fluent Moving all extremities Doing better today' Await placement

## 2020-05-14 NOTE — Progress Notes (Signed)
Initial Nutrition Assessment  DOCUMENTATION CODES:   Not applicable  INTERVENTION:  MVI with minerals daily  Continue Ensure Enlive po BID, each supplement provides 350 kcal and 20 grams of protein  NUTRITION DIAGNOSIS:   Increased nutrient needs related to wound healing as evidenced by estimated needs.   GOAL:   Patient will meet greater than or equal to 90% of their needs   MONITOR:   PO intake,Supplement acceptance,Labs,I & O's,Weight trends  REASON FOR ASSESSMENT:        ASSESSMENT:   Pt with h/o L3 kyphoplasty admitted for surgery for L4 compression fracture with spinal stenosis s/p fall. PMH also includes HTN, OSA, DM, diverticulosis, depression, gout, HLD, CKD  Per Neurosurgey, pt was doing much better today -- speech clear/fluent, pt A&Ox4, and tolerating po intake (though no PO intake documented). Pt with orders for Ensure BID and is consuming well per RN. Recommend continue with current nutrition plan of care. Pt noted to be pending placement.   Reviewed weight history. No significant weight changes noted.   UOP: 357ml documented today  Medications: colace, glucotrol, SSI Q4H, protonix Labs: Na 134 (L), CBGs 301-601-09  Diet Order:   Diet Order            Diet regular Room service appropriate? Yes; Fluid consistency: Thin  Diet effective now                 EDUCATION NEEDS:   No education needs have been identified at this time  Skin:  Skin Assessment: Skin Integrity Issues: Skin Integrity Issues:: Incisions Incisions: back  Last BM:  4/14  Height:   Ht Readings from Last 1 Encounters:  05/11/20 5\' 9"  (1.753 m)    Weight:   Wt Readings from Last 1 Encounters:  05/11/20 90.7 kg    BMI:  Body mass index is 29.53 kg/m.  Estimated Nutritional Needs:   Kcal:  2000-2200  Protein:  100-115 g  Fluid:  >2L    Larkin Ina, MS, RD, LDN RD pager number and weekend/on-call pager number located in [place].

## 2020-05-15 LAB — GLUCOSE, CAPILLARY
Glucose-Capillary: 127 mg/dL — ABNORMAL HIGH (ref 70–99)
Glucose-Capillary: 166 mg/dL — ABNORMAL HIGH (ref 70–99)
Glucose-Capillary: 305 mg/dL — ABNORMAL HIGH (ref 70–99)
Glucose-Capillary: 87 mg/dL (ref 70–99)
Glucose-Capillary: 88 mg/dL (ref 70–99)
Glucose-Capillary: 95 mg/dL (ref 70–99)

## 2020-05-15 NOTE — Progress Notes (Signed)
Occupational Therapy Treatment Patient Details Name: Jeffery Fernandez MRN: [id number] DOB: 21-Mar-1945 Today's Date: 05/15/2020    History of present illness 75 y/o male who recently had fall and has had increasing back and bilateral leg pain. MRI found L4 compression fx with spinal stenosis. Patient now s/p L4-5 decompression and fusion on 4/14. 4/16 somnolent, confused; head CT no acute changes  PMH: CKD, depression, DM, HLD, HTN, OSA, nephrolithiasis.   OT comments  Patient supine in bed and agreeable to OT session.  Unable to recall back precautions, re-educated and recalled 1/3 at completion of session.  Patient remains pleasantly confused, following simple commands with increased time but with poor awareness, problem solving and recall noted.  He requires mod assist for bed mobility today, sitting EOB for 2-3 minutes with posterior lean and fatigues quickly.  Report R LE (thigh/groin) pain and requests to lay back down, requiring max assist to ensure adherence to precautions.  Believe he will best benefit from CIR level rehab to optimize independence with ADLs and decrease burden of care.  Will follow acutely.    Follow Up Recommendations  CIR;Supervision/Assistance - 24 hour    Equipment Recommendations  Other (comment) (defer to next venue)    Recommendations for Other Services      Precautions / Restrictions Precautions Precautions: Fall;Back Precaution Booklet Issued: No Precaution Comments: reviewed 3/3 back precautions. No recall from previous sessions. Required Braces or Orthoses: Spinal Brace Spinal Brace:  (pt requesting to lay back down before donning brace) Restrictions Weight Bearing Restrictions: No       Mobility Bed Mobility Overal bed mobility: Needs Assistance Bed Mobility: Rolling;Sidelying to Sit;Sit to Sidelying Rolling: Min assist Sidelying to sit: Mod assist;HOB elevated     Sit to sidelying: Max assist General bed mobility comments: +rail, cueing for  sequencing and technique using log roll, requires assist to bring LEs fully off EOB and elevate trunk minimally, min assist to scoot forward.  Returend to sidelying with max assist to ensure log roll technique    Transfers                 General transfer comment: unable, pt requesting to return to bed    Balance Overall balance assessment: Needs assistance Sitting-balance support: No upper extremity supported;Feet supported Sitting balance-Leahy Scale: Poor Sitting balance - Comments: min assist to maintain static sitting balance Postural control: Posterior lean                                 ADL either performed or assessed with clinical judgement   ADL Overall ADL's : Needs assistance/impaired                                     Functional mobility during ADLs: Maximal assistance;Cueing for sequencing;Cueing for safety General ADL Comments: pt transitioned from supine to EOB using log roll technique with mod assist, once sitting EOB for 2-3 minutes patient requested to return back to bed.     Vision       Perception     Praxis      Cognition Arousal/Alertness: Awake/alert Behavior During Therapy: Flat affect Overall Cognitive Status: Impaired/Different from baseline Area of Impairment: Orientation;Attention;Memory;Following commands;Awareness;Problem solving                 Orientation Level: Disoriented to;Place;Time Current Attention Level: Sustained  Memory: Decreased recall of precautions;Decreased short-term memory Following Commands: Follows one step commands inconsistently;Follows one step commands with increased time   Awareness: Intellectual Problem Solving: Slow processing;Decreased initiation;Difficulty sequencing;Requires verbal cues;Requires tactile cues General Comments: continues to require increased time, cueing to attend, able to recall 1/3 back precautions at completion of session        Exercises      Shoulder Instructions       General Comments spouse present    Pertinent Vitals/ Pain       Pain Assessment: Faces Faces Pain Scale: Hurts even more Pain Location: R LE Pain Descriptors / Indicators: Sore;Pressure;Discomfort Pain Intervention(s): Limited activity within patient's tolerance;Monitored during session;Repositioned  Home Living                                          Prior Functioning/Environment              Frequency  Min 2X/week        Progress Toward Goals  OT Goals(current goals can now be found in the care plan section)  Progress towards OT goals: Progressing toward goals  Acute Rehab OT Goals Patient Stated Goal: to get to rehab to get better OT Goal Formulation: With patient/family  Plan Discharge plan needs to be updated;Frequency remains appropriate    Co-evaluation                 AM-PAC OT "6 Clicks" Daily Activity     Outcome Measure   Help from another person eating meals?: None Help from another person taking care of personal grooming?: A Little Help from another person toileting, which includes using toliet, bedpan, or urinal?: A Lot Help from another person bathing (including washing, rinsing, drying)?: A Lot Help from another person to put on and taking off regular upper body clothing?: A Lot Help from another person to put on and taking off regular lower body clothing?: A Lot 6 Click Score: 15    End of Session    OT Visit Diagnosis: Unsteadiness on feet (R26.81);Pain;Cognitive communication deficit (R41.841) Pain - part of body:  (R LE)   Activity Tolerance Patient limited by fatigue;Patient limited by pain   Patient Left in bed;with call bell/phone within reach;with bed alarm set;with family/visitor present   Nurse Communication Mobility status;Other (comment) (pain)        Time: 8850-2774 OT Time Calculation (min): 16 min  Charges: OT General Charges $OT Visit: 1 Visit OT  Treatments $Self Care/Home Management : 8-22 mins  Jolaine Artist, OT Acute Rehabilitation Services Pager [phone] Office [phone]    Delight Stare 05/15/2020, 3:33 PM

## 2020-05-15 NOTE — Progress Notes (Signed)
Inpatient Diabetes Program Recommendations  AACE/ADA: New Consensus Statement on Inpatient Glycemic Control   Target Ranges:  Prepandial:   less than 140 mg/dL      Peak postprandial:   less than 180 mg/dL (1-2 hours)      Critically ill patients:  140 - 180 mg/dL   Results for Jeffery Fernandez, Jeffery Fernandez (MRN [id number]) as of 05/15/2020 09:45  Ref. Range 05/14/2020 08:11 05/14/2020 12:10 05/14/2020 16:00 05/14/2020 19:40 05/14/2020 23:22 05/15/2020 03:38 05/15/2020 07:59  Glucose-Capillary Latest Ref Range: 70 - 99 mg/dL 70 126 (H) 131 (H) 131 (H) 88 88 87   Review of Glycemic Control  Current orders for Inpatient glycemic control: Novolog 0-20 units Q4H, Glipizide XL 10 mg BID  Inpatient Diabetes Program Recommendations:    Insulin: Please consider changing frequency of CBGs to AC&HS and decreasing Novolog to 0-15 units TID with meals and Novolog 0-5 units QHS.  Thanks, Barnie Alderman, RN, MSN, CDE Diabetes Coordinator Inpatient Diabetes Program [phone] (Team Pager from 8am to 5pm)

## 2020-05-15 NOTE — Progress Notes (Addendum)
Physical Therapy Treatment Patient Details Name: Jeffery Fernandez MRN: [id number] DOB: 1945-12-15 Today's Date: 05/15/2020    History of Present Illness 75 y/o male who recently had fall and has had increasing back and bilateral leg pain. MRI found L4 compression fx with spinal stenosis. Patient now s/p L4-5 decompression and fusion on 4/14. 4/16 somnolent, confused; head CT no acute changes  PMH: CKD, depression, DM, HLD, HTN, OSA, nephrolithiasis.    PT Comments    Pt received in bed, agreeable to participation in therapy. Pt remains confused but mentation is improving. He required +2 mod assist bed mobility, +2 mod assist transfers, and +2 mod assist ambulation 3' with RW.  He presents with poor balance in sitting and standing. Pt with c/o R ankle pain, tender to palpation but no c/o increased pain with WB. Reviewed 3/3 back precautions and brace wear schedule. Pt in recliner at end of session.   Follow Up Recommendations  CIR;Supervision/Assistance - 24 hour     Equipment Recommendations  None recommended by PT    Recommendations for Other Services Rehab consult     Precautions / Restrictions Precautions Precautions: Fall;Back Precaution Comments: reviewed 3/3 back precautions. No recall from previous sessions. Required Braces or Orthoses: Spinal Brace Spinal Brace: Lumbar corset;Applied in sitting position    Mobility  Bed Mobility Overal bed mobility: Needs Assistance Bed Mobility: Rolling;Sidelying to Sit Rolling: Mod assist Sidelying to sit: +2 for physical assistance;Mod assist;HOB elevated       General bed mobility comments: +rail, cues for sequencing and precautions, assist with BLE and to elevate trunk, increased time    Transfers Overall transfer level: Needs assistance Equipment used: Rolling walker (2 wheeled) Transfers: Sit to/from Stand Sit to Stand: +2 physical assistance;Mod assist;+2 safety/equipment         General transfer comment: sit to stand x  2 trials with RW from EOB. On second trial, pt able to take pivot steps with RW bed to recliner. Assist to maintain balance and manage RW. Continuous cues for sequencing.  Ambulation/Gait Ambulation/Gait assistance: Mod assist;+2 physical assistance;+2 safety/equipment Gait Distance (Feet): 3 Feet Assistive device: Rolling walker (2 wheeled) Gait Pattern/deviations: Decreased stride length     General Gait Details: pivot steps bed to recliner with RW   Stairs             Wheelchair Mobility    Modified Rankin (Stroke Patients Only)       Balance Overall balance assessment: Needs assistance Sitting-balance support: Feet supported;Bilateral upper extremity supported Sitting balance-Leahy Scale: Poor Sitting balance - Comments: min assist to maintain sitting balance EOB Postural control: Posterior lean Standing balance support: During functional activity;Bilateral upper extremity supported Standing balance-Leahy Scale: Poor Standing balance comment: reliant on external support                            Cognition Arousal/Alertness: Awake/alert Behavior During Therapy: Flat affect Overall Cognitive Status: Impaired/Different from baseline Area of Impairment: Orientation;Attention;Memory;Following commands;Awareness;Problem solving                 Orientation Level: Place;Time;Disoriented to Current Attention Level: Sustained Memory: Decreased recall of precautions;Decreased short-term memory Following Commands: Follows one step commands inconsistently;Follows one step commands with increased time   Awareness: Intellectual Problem Solving: Slow processing;Decreased initiation;Difficulty sequencing;Requires verbal cues;Requires tactile cues General Comments: cues to stay on task, increased time      Exercises      General Comments General comments (  skin integrity, edema, etc.): dependent with donning back brace      Pertinent Vitals/Pain Pain  Assessment: Faces Faces Pain Scale: Hurts even more Pain Location: R ankle Pain Descriptors / Indicators: Tender;Grimacing;Guarding;Sore Pain Intervention(s): Limited activity within patient's tolerance;Monitored during session;Repositioned    Home Living                      Prior Function            PT Goals (current goals can now be found in the care plan section) Acute Rehab PT Goals Patient Stated Goal: home Progress towards PT goals: Progressing toward goals    Frequency    Min 5X/week      PT Plan Discharge plan needs to be updated    Co-evaluation              AM-PAC PT "6 Clicks" Mobility   Outcome Measure  Help needed turning from your back to your side while in a flat bed without using bedrails?: A Lot Help needed moving from lying on your back to sitting on the side of a flat bed without using bedrails?: A Lot Help needed moving to and from a bed to a chair (including a wheelchair)?: A Lot Help needed standing up from a chair using your arms (e.g., wheelchair or bedside chair)?: A Lot Help needed to walk in hospital room?: A Lot Help needed climbing 3-5 steps with a railing? : Total 6 Click Score: 11    End of Session Equipment Utilized During Treatment: Gait belt;Back brace Activity Tolerance: Patient tolerated treatment well Patient left: in chair;with call bell/phone within reach;with chair alarm set Nurse Communication: Mobility status PT Visit Diagnosis: Unsteadiness on feet (R26.81);Muscle weakness (generalized) (M62.81);History of falling (Z91.81)     Time: 7614-7092 PT Time Calculation (min) (ACUTE ONLY): 24 min  Charges:  $Gait Training: 8-22 mins $Therapeutic Activity: 8-22 mins                     Lorrin Goodell, PT  Office # [phone] Pager [phone]    Lorriane Shire 05/15/2020, 11:03 AM

## 2020-05-15 NOTE — Progress Notes (Signed)
Pt is alert to self but disoriented, to place, situation and time, confused but redirectable, neurosurgery on call made aware, no signs of distress noted at the moment.

## 2020-05-15 NOTE — Progress Notes (Signed)
Subjective: The patient is alert and pleasant.  Complains of back and bilateral leg pain.  He is in no apparent distress.  Objective: Vital signs in last 24 hours: Temp:  [97.5 F (36.4 C)-99.9 F (37.7 C)] 97.5 F (36.4 C) (04/18 0803) Pulse Rate:  [72-94] 78 (04/18 0803) Resp:  [16-20] 16 (04/18 0803) BP: (111-134)/(51-95) 130/62 (04/18 0803) SpO2:  [91 %-100 %] 94 % (04/18 0803) Estimated body mass index is 29.53 kg/m as calculated from the following:   Height as of this encounter: 5\' 9"  (1.753 m).   Weight as of this encounter: 90.7 kg.   Intake/Output from previous day: 04/17 0701 - 04/18 0700 In: -  Out: 725 [Urine:725] Intake/Output this shift: Total I/O In: -  Out: 350 [Urine:350]  Physical exam the patient is alert and pleasant.  His gastrocnemius and dorsiflexor strength is grossly normal except he gives away.  Lab Results: No results for input(s): WBC, HGB, HCT, PLT in the last 72 hours. BMET No results for input(s): NA, K, CL, CO2, GLUCOSE, BUN, CREATININE, CALCIUM in the last 72 hours.  Studies/Results: CT HEAD WO CONTRAST  Result Date: 05/13/2020 CLINICAL DATA:  Mental status change, unknown cause; decreased mental status. EXAM: CT HEAD WITHOUT CONTRAST TECHNIQUE: Contiguous axial images were obtained from the base of the skull through the vertex without intravenous contrast. COMPARISON:  Brain MRI 04/10/2016. head CT 03/29/2016. FINDINGS: Brain: Mild cerebral and cerebellar atrophy. Advanced patchy and ill-defined hypoattenuation within the cerebral white matter is nonspecific, but compatible with chronic small vessel ischemic disease. There is no acute intracranial hemorrhage. No demarcated cortical infarct. No extra-axial fluid collection. No evidence of intracranial mass. No midline shift. Vascular: No hyperdense vessel.  Atherosclerotic calcifications Skull: Normal. Negative for fracture or focal lesion. Sinuses/Orbits: Visualized orbits show no acute finding.  Mild bilateral ethmoid sinus mucosal thickening. IMPRESSION: No evidence of acute intracranial abnormality. Mild generalized parenchymal atrophy with advanced cerebral white matter chronic small vessel ischemic disease. Mild bilateral ethmoid sinus mucosal thickening. Electronically Signed   By: Kellie Simmering DO   On: 05/13/2020 17:17    Assessment/Plan: Postop day #4: It looks like the patient may benefit from rehab.  I will asked the rehab team to see him.  LOS: 4 days     Ophelia Charter 05/15/2020, 9:39 AM

## 2020-05-16 LAB — GLUCOSE, CAPILLARY
Glucose-Capillary: 99 mg/dL (ref 70–99)
Glucose-Capillary: 107 mg/dL — ABNORMAL HIGH (ref 70–99)
Glucose-Capillary: 226 mg/dL — ABNORMAL HIGH (ref 70–99)
Glucose-Capillary: 249 mg/dL — ABNORMAL HIGH (ref 70–99)
Glucose-Capillary: 187 mg/dL — ABNORMAL HIGH (ref 70–99)
Glucose-Capillary: 135 mg/dL — ABNORMAL HIGH (ref 70–99)

## 2020-05-16 NOTE — Progress Notes (Signed)
Physical Therapy Treatment Patient Details Name: Jeffery Fernandez MRN: [id number] DOB: 12/07/1945 Today's Date: 05/16/2020    History of Present Illness 75 y/o male who recently had fall and has had increasing back and bilateral leg pain. MRI found L4 compression fx with spinal stenosis. Patient now s/p L4-5 decompression and fusion on 4/14. 4/16 somnolent, confused; head CT no acute changes  PMH: CKD, depression, DM, HLD, HTN, OSA, nephrolithiasis.    PT Comments    Pt making steady progress with mobility and cognition improving. Continue to feel pt could benefit from CIR for further therapy prior to return home.    Follow Up Recommendations  CIR;Supervision/Assistance - 24 hour     Equipment Recommendations  None recommended by PT    Recommendations for Other Services       Precautions / Restrictions Precautions Precautions: Fall;Back Required Braces or Orthoses: Spinal Brace Spinal Brace: Lumbar corset;Applied in sitting position    Mobility  Bed Mobility Overal bed mobility: Needs Assistance Bed Mobility: Rolling;Sidelying to Sit Rolling: Min assist Sidelying to sit: Mod assist;HOB elevated       General bed mobility comments: Verbal cues for technique. Mod assist to initiate elevation of trunk into sitting with min assist to complete elevation into sitting.    Transfers Overall transfer level: Needs assistance Equipment used: Rolling walker (2 wheeled) Transfers: Sit to/from Stand Sit to Stand: Mod assist;Min assist;+2 safety/equipment         General transfer comment: Assist to bring hips up and for balance. Verbal cues for hand placement. Mod assist to rise from bed and min assist to rise from recliner.  Ambulation/Gait Ambulation/Gait assistance: +2 safety/equipment;Min assist Gait Distance (Feet): 10 Feet (10' x 1, 3' x 1) Assistive device: Rolling walker (2 wheeled) Gait Pattern/deviations: Step-through pattern;Decreased step length - right;Decreased step  length - left;Shuffle;Trunk flexed Gait velocity: decr Gait velocity interpretation: <1.31 ft/sec, indicative of household ambulator General Gait Details: Assist for balance and support and moderate verbal cues for gait sequence and management of walker.   Stairs             Wheelchair Mobility    Modified Rankin (Stroke Patients Only)       Balance Overall balance assessment: Needs assistance Sitting-balance support: Feet supported;No upper extremity supported Sitting balance-Leahy Scale: Fair     Standing balance support: During functional activity;Bilateral upper extremity supported Standing balance-Leahy Scale: Poor Standing balance comment: walker and min assist for static standing                            Cognition Arousal/Alertness: Awake/alert Behavior During Therapy: WFL for tasks assessed/performed Overall Cognitive Status: Impaired/Different from baseline Area of Impairment: Attention;Memory;Following commands;Awareness;Problem solving                   Current Attention Level: Sustained Memory: Decreased recall of precautions;Decreased short-term memory Following Commands: Follows one step commands consistently;Follows multi-step commands with increased time   Awareness: Intellectual;Emergent Problem Solving: Slow processing;Decreased initiation;Requires verbal cues        Exercises      General Comments General comments (skin integrity, edema, etc.): SpO2 91% on RA after amb. Left O2 off and nurse aware      Pertinent Vitals/Pain Pain Assessment: Faces Faces Pain Scale: Hurts little more Pain Location: back Pain Descriptors / Indicators: Grimacing;Operative site guarding Pain Intervention(s): Limited activity within patient's tolerance;Repositioned    Home Living  Prior Function            PT Goals (current goals can now be found in the care plan section) Acute Rehab PT Goals Patient  Stated Goal: home Progress towards PT goals: Progressing toward goals    Frequency           PT Plan Current plan remains appropriate    Co-evaluation              AM-PAC PT "6 Clicks" Mobility   Outcome Measure  Help needed turning from your back to your side while in a flat bed without using bedrails?: A Little Help needed moving from lying on your back to sitting on the side of a flat bed without using bedrails?: A Lot Help needed moving to and from a bed to a chair (including a wheelchair)?: A Lot Help needed standing up from a chair using your arms (e.g., wheelchair or bedside chair)?: A Little Help needed to walk in hospital room?: A Little Help needed climbing 3-5 steps with a railing? : Total 6 Click Score: 14    End of Session Equipment Utilized During Treatment: Gait belt;Back brace Activity Tolerance: Patient tolerated treatment well Patient left: in chair;with call bell/phone within reach;with chair alarm set;with family/visitor present Nurse Communication: Mobility status PT Visit Diagnosis: Unsteadiness on feet (R26.81);Muscle weakness (generalized) (M62.81);History of falling (Z91.81)     Time: 4888-9169 PT Time Calculation (min) (ACUTE ONLY): 21 min  Charges:  $Gait Training: 8-22 mins                     [place] Pager [phone] [place] 05/16/2020, 1:36 PM

## 2020-05-16 NOTE — Plan of Care (Signed)
  Problem: Education: Goal: Knowledge of General Education information will improve Description: Including pain rating scale, medication(s)/side effects and non-pharmacologic comfort measures 05/16/2020 1129 by Drucie Ip I, RN Outcome: Progressing 05/16/2020 1129 by Drucie Ip I, RN Outcome: Progressing   Problem: Health Behavior/Discharge Planning: Goal: Ability to manage health-related needs will improve 05/16/2020 1129 by Drucie Ip I, RN Outcome: Progressing 05/16/2020 1129 by Drucie Ip I, RN Outcome: Progressing   Problem: Clinical Measurements: Goal: Ability to maintain clinical measurements within normal limits will improve 05/16/2020 1129 by Drucie Ip I, RN Outcome: Progressing 05/16/2020 1129 by Drucie Ip I, RN Outcome: Progressing Goal: Will remain free from infection 05/16/2020 1129 by Drucie Ip I, RN Outcome: Progressing 05/16/2020 1129 by Drucie Ip I, RN Outcome: Progressing Goal: Diagnostic test results will improve 05/16/2020 1129 by Drucie Ip I, RN Outcome: Progressing 05/16/2020 1129 by Drucie Ip I, RN Outcome: Progressing Goal: Respiratory complications will improve 05/16/2020 1129 by Drucie Ip I, RN Outcome: Progressing 05/16/2020 1129 by Drucie Ip I, RN Outcome: Progressing Goal: Cardiovascular complication will be avoided 05/16/2020 1129 by Drucie Ip I, RN Outcome: Progressing 05/16/2020 1129 by Drucie Ip I, RN Outcome: Progressing   Problem: Activity: Goal: Risk for activity intolerance will decrease 05/16/2020 1129 by Drucie Ip I, RN Outcome: Progressing 05/16/2020 1129 by Drucie Ip I, RN Outcome: Progressing   Problem: Nutrition: Goal: Adequate nutrition will be maintained 05/16/2020 1129 by Drucie Ip I, RN Outcome: Progressing 05/16/2020 1129 by Drucie Ip I, RN Outcome: Progressing   Problem: Coping: Goal: Level  of anxiety will decrease 05/16/2020 1129 by Drucie Ip I, RN Outcome: Progressing 05/16/2020 1129 by Drucie Ip I, RN Outcome: Progressing   Problem: Elimination: Goal: Will not experience complications related to bowel motility 05/16/2020 1129 by Drucie Ip I, RN Outcome: Progressing 05/16/2020 1129 by Drucie Ip I, RN Outcome: Progressing Goal: Will not experience complications related to urinary retention 05/16/2020 1129 by Drucie Ip I, RN Outcome: Progressing 05/16/2020 1129 by Drucie Ip I, RN Outcome: Progressing   Problem: Pain Managment: Goal: General experience of comfort will improve 05/16/2020 1129 by Drucie Ip I, RN Outcome: Progressing 05/16/2020 1129 by Drucie Ip I, RN Outcome: Progressing   Problem: Safety: Goal: Ability to remain free from injury will improve 05/16/2020 1129 by Drucie Ip I, RN Outcome: Progressing 05/16/2020 1129 by Drucie Ip I, RN Outcome: Progressing   Problem: Skin Integrity: Goal: Risk for impaired skin integrity will decrease 05/16/2020 1129 by Drucie Ip I, RN Outcome: Progressing 05/16/2020 1129 by Drucie Ip I, RN Outcome: Progressing

## 2020-05-16 NOTE — Progress Notes (Signed)
Inpatient Rehab Admissions Coordinator:   I met with pt. And wife for ongoing discussion regarding CIR admission. I do not have insurance authorization at this time, but opened a case this morning. I will continue to follow for potential admission pending insurance authorization and bed availability.   Clemens Catholic, [place], [place] Admissions Coordinator  [phone] ([place]) [phone] (office)

## 2020-05-16 NOTE — Plan of Care (Signed)

## 2020-05-16 NOTE — Progress Notes (Signed)
Inpatient Rehab Admissions Coordinator:   Met with patient at bedside to discuss potential CIR admission. Pt. Stated interest. Will pursue for potential admit this week, pending bed availability and insurance authorization.  Clemens Catholic, [place], [place] Admissions Coordinator  [phone] ([place]) [phone] (office)

## 2020-05-16 NOTE — Progress Notes (Signed)
Subjective: The patient is alert and pleasant.  He is in no apparent distress.  Objective: Vital signs in last 24 hours: Temp:  [98.2 F (36.8 C)-98.5 F (36.9 C)] 98.5 F (36.9 C) (04/19 0353) Pulse Rate:  [58-103] 58 (04/19 0747) Resp:  [16-18] 16 (04/19 0747) BP: (103-141)/(58-69) 121/58 (04/19 0747) SpO2:  [92 %-98 %] 94 % (04/19 0747) Estimated body mass index is 29.53 kg/m as calculated from the following:   Height as of this encounter: 5\' 9"  (1.753 m).   Weight as of this encounter: 90.7 kg.   Intake/Output from previous day: 04/18 0701 - 04/19 0700 In: -  Out: 500 [Urine:500] Intake/Output this shift: No intake/output data recorded.  Physical exam the patient is alert and pleasant.  He is moving his lower extremities well.  His lumbar incision is healing well.  Lab Results: No results for input(s): WBC, HGB, HCT, PLT in the last 72 hours. BMET No results for input(s): NA, K, CL, CO2, GLUCOSE, BUN, CREATININE, CALCIUM in the last 72 hours.  Studies/Results: No results found.  Assessment/Plan: Postop day #5: We are awaiting rehab placement.  LOS: 5 days     Ophelia Charter 05/16/2020, 8:07 AM

## 2020-05-16 NOTE — PMR Pre-admission (Shared)
PMR Admission Coordinator Pre-Admission Assessment  Patient: Jeffery Fernandez is an 75 y.o., male MRN: [id number] DOB: Jun 27, 1945 Height: '5\' 9"'  (175.3 cm) Weight: 90.7 kg  Insurance Information HMO: ***    PPO: ***     PCP: ***     IPA: ***     80/20: ***     OTHER: *** PRIMARY: [employer]     Policy#: [id number]    Subscriber: *** CM Name: ***      Phone#: ***     Fax#: *** Pre-Cert#: ***      Employer: *** Benefits:  Phone #: ***     Name: *** [name]. Date: ***     Deduct: ***      Out of Pocket Max: ***      Life Max: *** CIR: ***      SNF: *** Outpatient: ***     Co-Pay: *** Home Health: ***      Co-Pay: *** DME: ***     Co-Pay: *** Providers: *** SECONDARY: ***      Policy#: ***     Phone#: ***  Financial Counselor:       Phone#:   The "Data Collection Information Summary" for patients in Inpatient Rehabilitation Facilities with attached "Privacy Act [place] Records" was provided and verbally reviewed with: Patient  Emergency Contact Information Contact Information    Name Relation Home Work [place] Spouse   [phone]   Linda Hedges Daughter   [phone]      Current Medical History  Patient Admitting Diagnosis: Lumbar Myelopathy History of Present Illness:  The patient is a 75 year old white male with past medical history of CKD, depression, DM, HLD, HTN, OSA, nephrolithiasis who took a fall and has severe back and bilateral leg pain.  He failed medical management.  He was worked up with lumbar x-rays and lumbar MRI which demonstrated an L4 compression fracture with spinal stenosis. Pt. Underwent Bilateral L4-5 laminotomy/foraminotomies/medial facetectomy to decompress the bilateral L5 nerve roots with Dr. Arnoldo Morale on 4/14. Pt. With some post-op encephalopathy, resolving.     Patient's medical record from [facility] has been reviewed by the rehabilitation admission coordinator and physician.  Past Medical History  Past  Medical History:  Diagnosis Date  . Arrhythmia   . Arthritis   . Cataract    bilateral cataract extraction with intraoccular lens implants  . Chest pain, unspecified   . Chronic airway obstruction, not elsewhere classified    PT. DENIES HE HAS COPD  . CKD (chronic kidney disease)   . Colon polyps    Adenomatous Polyps 2007  . Depression   . Diabetes mellitus   . Diverticulosis   . Gout   . Heart murmur   . History of kidney stones   . Hyperlipemia   . Hypertension   . Nephrolithiasis   . Normocytic anemia 04/23/2017  . Obstructive sleep apnea (adult) (pediatric)   . Other and unspecified hyperlipidemia   . Other left bundle branch block   . Retinal vascular occlusion, unspecified    right  . Shortness of breath    on exertion  . Unspecified sleep apnea   . Vision loss of right eye   . White coat syndrome with diagnosis of hypertension 09/19/2017    Family History   family history includes Arthritis/Rheumatoid in his cousin; Atrial fibrillation in his brother; Diabetes in an other family member; Heart attack (age of onset: 34) in his brother; Heart disease in his  father; Parkinson's disease in his brother; Stroke in his father; Uterine cancer in his mother.  Prior Rehab/Hospitalizations Has the patient had prior rehab or hospitalizations prior to admission? Yes  Has the patient had major surgery during 100 days prior to admission? Yes   Current Medications  Current Facility-Administered Medications:  .  0.9 %  sodium chloride infusion, 250 mL, Intravenous, Continuous, Newman Pies, MD, Last Rate: 1 mL/hr at 05/12/20 0449, 250 mL at 05/12/20 0449 .  acetaminophen (TYLENOL) tablet 650 mg, 650 mg, Oral, Q4H PRN **OR** acetaminophen (TYLENOL) suppository 650 mg, 650 mg, Rectal, Q4H PRN, Newman Pies, MD .  alum & mag hydroxide-simeth (MAALOX/MYLANTA) 200-200-20 MG/5ML suspension 30 mL, 30 mL, Oral, Q6H PRN, Newman Pies, MD, 30 mL at 05/12/20 1303 .  amLODipine  (NORVASC) tablet 5 mg, 5 mg, Oral, Daily, Newman Pies, MD, 5 mg at 05/16/20 0853 .  atorvastatin (LIPITOR) tablet 20 mg, 20 mg, Oral, QHS, Newman Pies, MD, 20 mg at 05/15/20 2127 .  bisacodyl (DULCOLAX) suppository 10 mg, 10 mg, Rectal, Daily PRN, Newman Pies, MD, 10 mg at 05/15/20 1305 .  carvedilol (COREG) tablet 25 mg, 25 mg, Oral, BID WC, Newman Pies, MD, 25 mg at 05/16/20 0853 .  cyclobenzaprine (FLEXERIL) tablet 10 mg, 10 mg, Oral, TID PRN, Newman Pies, MD, 10 mg at 05/14/20 1222 .  docusate sodium (COLACE) capsule 100 mg, 100 mg, Oral, BID, Newman Pies, MD, 100 mg at 05/16/20 0852 .  feeding supplement (ENSURE ENLIVE / ENSURE PLUS) liquid 237 mL, 237 mL, Oral, BID BM, Newman Pies, MD, 237 mL at 05/16/20 0856 .  fluticasone (FLONASE) 50 MCG/ACT nasal spray 1 spray, 1 spray, Each Nare, Daily PRN, Newman Pies, MD .  glipiZIDE (GLUCOTROL XL) 24 hr tablet 10 mg, 10 mg, Oral, BID, Newman Pies, MD, 10 mg at 05/16/20 0854 .  hydrochlorothiazide (MICROZIDE) capsule 12.5 mg, 12.5 mg, Oral, Daily, Newman Pies, MD, 12.5 mg at 05/16/20 0853 .  insulin aspart (novoLOG) injection 0-20 Units, 0-20 Units, Subcutaneous, Q4H, Newman Pies, MD, 3 Units at 05/16/20 0008 .  lisinopril (ZESTRIL) tablet 40 mg, 40 mg, Oral, Daily, Newman Pies, MD, 40 mg at 05/16/20 0853 .  menthol-cetylpyridinium (CEPACOL) lozenge 3 mg, 1 lozenge, Oral, PRN **OR** phenol (CHLORASEPTIC) mouth spray 1 spray, 1 spray, Mouth/Throat, PRN, Newman Pies, MD .  morphine 4 MG/ML injection 4 mg, 4 mg, Intravenous, Q2H PRN, Newman Pies, MD .  multivitamin with minerals tablet 1 tablet, 1 tablet, Oral, Daily, Newman Pies, MD, 1 tablet at 05/16/20 0854 .  ondansetron (ZOFRAN) tablet 4 mg, 4 mg, Oral, Q6H PRN **OR** ondansetron (ZOFRAN) injection 4 mg, 4 mg, Intravenous, Q6H PRN, Newman Pies, MD .  oxyCODONE (Oxy IR/ROXICODONE) immediate release tablet 10 mg, 10 mg, Oral,  Q3H PRN, Newman Pies, MD, 10 mg at 05/15/20 2127 .  oxyCODONE (Oxy IR/ROXICODONE) immediate release tablet 5 mg, 5 mg, Oral, Q3H PRN, Newman Pies, MD, 5 mg at 05/15/20 1743 .  pantoprazole (PROTONIX) EC tablet 40 mg, 40 mg, Oral, Daily, Newman Pies, MD, 40 mg at 05/16/20 0855 .  polyethylene glycol (MIRALAX / GLYCOLAX) packet 17 g, 17 g, Oral, Daily PRN, Newman Pies, MD, 17 g at 05/14/20 2059 .  sertraline (ZOLOFT) tablet 100 mg, 100 mg, Oral, Daily, Newman Pies, MD, 100 mg at 05/16/20 0853 .  sodium chloride flush (NS) 0.9 % injection 3 mL, 3 mL, Intravenous, Q12H, Newman Pies, MD, 3 mL at 05/16/20 0855 .  sodium chloride flush (NS) 0.9 %  injection 3 mL, 3 mL, Intravenous, PRN, Newman Pies, MD .  terazosin (HYTRIN) capsule 5 mg, 5 mg, Oral, QHS, Newman Pies, MD, 5 mg at 05/15/20 2127  Patients Current Diet:  Diet Order            Diet regular Room service appropriate? Yes; Fluid consistency: Thin  Diet effective now                 Precautions / Restrictions Precautions Precautions: Fall,Back Precaution Booklet Issued: No Precaution Comments: reviewed 3/3 back precautions. No recall from previous sessions. Spinal Brace:  (pt requesting to lay back down before donning brace) Restrictions Weight Bearing Restrictions: No   Has the patient had 2 or more falls or a fall with injury in the past year? Yes  Prior Activity Level Community (5-7x/wk): Pt. active in the community PTA  Prior Functional Level Self Care: Did the patient need help bathing, dressing, using the toilet or eating? Independent  Indoor Mobility: Did the patient need assistance with walking from room to room (with or without device)? Independent  Stairs: Did the patient need assistance with internal or external stairs (with or without device)? Independent  Functional Cognition: Did the patient need help planning regular tasks such as shopping or remembering to take  medications? Needed some help  Home Assistive Devices / [place] Devices/Equipment: Eyeglasses,Dentures (specify type),Walker (specify type) Home Equipment: Toilet riser,Walker - 2 wheels,Cane - single point,Grab bars - toilet,Shower seat  Prior Device Use: Indicate devices/aids used by the patient prior to current illness, exacerbation or injury? None of the above  Current Functional Level Cognition  Overall Cognitive Status: Impaired/Different from baseline Current Attention Level: Sustained Orientation Level: Oriented to person,Disoriented to situation,Disoriented to time,Disoriented to place Following Commands: Follows one step commands inconsistently,Follows one step commands with increased time General Comments: continues to require increased time, cueing to attend, able to recall 1/3 back precautions at completion of session    Extremity Assessment (includes Sensation/Coordination)  Upper Extremity Assessment: Overall WFL for tasks assessed  Lower Extremity Assessment: Defer to PT evaluation    ADLs  Overall ADL's : Needs assistance/impaired Eating/Feeding: Independent Grooming: Wash/dry face,Set up,Bed level Upper Body Bathing: Set up,Bed level Lower Body Bathing: Total assistance,Bed level Lower Body Bathing Details (indicate cue type and reason): pt. incontinent of bladder upon arrival, then had 2nd occurance before i could provide clean up from initial episode.  had to provide care bed level with pt. rolling l/r Upper Body Dressing : Set up,Bed level Upper Body Dressing Details (indicate cue type and reason): able to don clean gown with instructions for arm holes Lower Body Dressing: Maximal assistance,Bed level Lower Body Dressing Details (indicate cue type and reason): Pt unable to perform figure 4. Pt moved to EOB sitting, but pain increased to 10/10 so he was assisted back to supine Toilet Transfer: Total assistance Toilet Transfer Details (indicate cue  type and reason): unable to perform at this time due to increased pain Toileting- Clothing Manipulation and Hygiene: Maximal assistance,Bed level Functional mobility during ADLs: Maximal assistance,Cueing for sequencing,Cueing for safety General ADL Comments: pt transitioned from supine to EOB using log roll technique with mod assist, once sitting EOB for 2-3 minutes patient requested to return back to bed.    Mobility  Overal bed mobility: Needs Assistance Bed Mobility: Rolling,Sidelying to Sit,Sit to Sidelying Rolling: Min assist Sidelying to sit: Mod assist,HOB elevated Sit to sidelying: Max assist General bed mobility comments: +rail, cueing for sequencing and technique  using log roll, requires assist to bring LEs fully off EOB and elevate trunk minimally, min assist to scoot forward.  Returend to sidelying with max assist to ensure log roll technique    Transfers  Overall transfer level: Needs assistance Equipment used: Rolling walker (2 wheeled) Transfers: Sit to/from Stand Sit to Stand: +2 physical assistance,Mod assist,+2 safety/equipment General transfer comment: unable, pt requesting to return to bed    Ambulation / Gait / Stairs / Wheelchair Mobility  Ambulation/Gait Ambulation/Gait assistance: Mod assist,+2 physical assistance,+2 safety/equipment Gait Distance (Feet): 3 Feet Assistive device: Rolling walker (2 wheeled) Gait Pattern/deviations: Decreased stride length General Gait Details: pivot steps bed to recliner with RW Gait velocity: decreased Stairs: Yes Stairs assistance: Min guard Stair Management: One rail Right,Step to pattern,Forwards Number of Stairs: 2 General stair comments: instructed patient on safe stair negotiation and use of single hand rail to simulate home environment    Posture / Balance Dynamic Sitting Balance Sitting balance - Comments: min assist to maintain static sitting balance Balance Overall balance assessment: Needs  assistance Sitting-balance support: No upper extremity supported,Feet supported Sitting balance-Leahy Scale: Poor Sitting balance - Comments: min assist to maintain static sitting balance Postural control: Posterior lean Standing balance support: During functional activity,Bilateral upper extremity supported Standing balance-Leahy Scale: Poor Standing balance comment: reliant on external support    Special needs/care consideration Skin *** and Special service needs ***   Previous Home Environment (from acute therapy documentation) Living Arrangements: Spouse/significant other  Lives With: Spouse Available Help at Discharge: Family,Available 24 hours/day Type of Home: House Home Layout: One level Home Access: Stairs to enter Entrance Stairs-Rails: Surveyor, mining of Steps: 5-6 Bathroom Shower/Tub: Ambulance person: Handicapped height Bathroom Accessibility: Yes Home Care Services: No  Discharge Living Setting Plans for Discharge Living Setting: Patient's home Type of Home at Discharge: House Discharge Home Layout: One level Discharge Home Access: Stairs to enter Entrance Stairs-Rails: Field seismologist of Steps: 5 Discharge Bathroom Shower/Tub: Tub/shower unit Discharge Bathroom Toilet: Standard Discharge Bathroom Accessibility: Yes How Accessible: Accessible via walker Does the patient have any problems obtaining your medications?: No  Social/Family/Support Systems Patient Roles: Spouse Contact Information: [phone] Anticipated Caregiver: Hal Neer Anticipated Caregiver's Contact Information: [phone] Caregiver Availability: 24/7 Discharge Plan Discussed with Primary Caregiver: Yes Is Caregiver In Agreement with Plan?: Yes Does Caregiver/Family have Issues with Lodging/Transportation while Pt is in Rehab?: Yes  Goals Patient/Family Goal for Rehab: PT/OT/SLP Min A Expected length of stay: 14-16  days Pt/Family Agrees to Admission and willing to participate: Yes Program Orientation Provided & Reviewed with Pt/Caregiver Including Roles  & Responsibilities: Yes  Decrease burden of Care through IP rehab admission: Specialzed equipment needs, Decrease number of caregivers, Bowel and bladder program and Patient/family education  Possible need for SNF placement upon discharge: not anticipated   Patient Condition: I have reviewed medical records from [facility] , spoken with CM, patient, and family.  I met with patient at the bedside and discussed via phone for inpatient rehabilitation assessment.  Patient will benefit from ongoing PT, OT and SLP, can actively participate in 3 hours of therapy a day 5 days of the week, and can make measurable gains during the admission.  Patient will also benefit from the coordinated team approach during an Inpatient Acute Rehabilitation admission.  The patient will receive intensive therapy as well as Rehabilitation physician, nursing, social worker, and care management interventions.  Due to safety, skin/wound care, disease management, medication administration and  patient education the patient requires 24 hour a day rehabilitation nursing.  The patient is currently *** with mobility and basic ADLs.  Discharge setting and therapy post discharge at [facility] IP discharge location:[id number]} is anticipated.  Patient has agreed to participate in the Acute Inpatient Rehabilitation Program and will admit {Time; today/tomorrow:10263}.  Preadmission Screen Completed By:  Genella Mech, 05/16/2020 9:12 AM ______________________________________________________________________   Discussed status with Dr. Marland Kitchen on *** at *** and received approval for admission today.  Admission Coordinator:  Genella Mech, CCC-SLP, time ***Sudie Grumbling ***   Assessment/Plan: Diagnosis: 1. Does the need for close, 24 hr/day Medical supervision in concert with the patient's rehab needs  make it unreasonable for this patient to be served in a less intensive setting? {yes_no_potentially:3041433} 2. Co-Morbidities requiring supervision/potential complications: *** 3. Due to {due DP:2415516}, does the patient require 24 hr/day rehab nursing? {yes_no_potentially:3041433} 4. Does the patient require coordinated care of a physician, rehab nurse, PT, OT, and SLP to address physical and functional deficits in the context of the above medical diagnosis(es)? {yes_no_potentially:3041433} Addressing deficits in the following areas: {deficits:3041436} 5. Can the patient actively participate in an intensive therapy program of at least 3 hrs of therapy 5 days a week? {yes_no_potentially:3041433} 6. The potential for patient to make measurable gains while on inpatient rehab is {potential:3041437} 7. Anticipated functional outcomes upon discharge from inpatient rehab: {functional outcomes:304600100} PT, {functional outcomes:304600100} OT, {functional outcomes:304600100} SLP 8. Estimated rehab length of stay to reach the above functional goals is: *** 9. Anticipated discharge destination: {anticipated dc setting:21604} 10. Overall Rehab/Functional Prognosis: {potential:3041437}   MD Signature: ***

## 2020-05-16 NOTE — Care Management Important Message (Signed)
**Note Jeffery-Identified via Obfuscation** Important Message  Patient Details  Name: Jeffery Fernandez MRN: [id number] Date of Birth: Jul 14, 1945   Medicare Important Message Given:  Yes     Ashyla Luth [address] 05/16/2020, 4:30 PM

## 2020-05-17 ENCOUNTER — Encounter (HOSPITAL_COMMUNITY): Payer: Self-pay | Admitting: Neurosurgery

## 2020-05-17 LAB — GLUCOSE, CAPILLARY
Glucose-Capillary: 112 mg/dL — ABNORMAL HIGH (ref 70–99)
Glucose-Capillary: 189 mg/dL — ABNORMAL HIGH (ref 70–99)
Glucose-Capillary: 279 mg/dL — ABNORMAL HIGH (ref 70–99)
Glucose-Capillary: 135 mg/dL — ABNORMAL HIGH (ref 70–99)
Glucose-Capillary: 63 mg/dL — ABNORMAL LOW (ref 70–99)

## 2020-05-17 NOTE — Progress Notes (Signed)
Inpatient Diabetes Program Recommendations  AACE/ADA: New Consensus Statement on Inpatient Glycemic Control   Target Ranges:  Prepandial:   less than 140 mg/dL      Peak postprandial:   less than 180 mg/dL (1-2 hours)      Critically ill patients:  140 - 180 mg/dL   Results for KIMO, BANCROFT (MRN [id number]) as of 05/17/2020 10:57  Ref. Range 05/16/2020 07:49 05/16/2020 12:32 05/16/2020 16:12 05/16/2020 19:39 05/16/2020 23:22 05/17/2020 08:02  Glucose-Capillary Latest Ref Range: 70 - 99 mg/dL 107 (H) 226 (H) 249 (H) 187 (H) 135 (H) 112 (H)   Review of Glycemic Control  Diabetes history: DM2 Outpatient Diabetes medications: Lantus 40 units daily, Glipizide XL 10 mg BID Current orders for Inpatient glycemic control: Novolog 0-20 units Q4H, Glipizide XL 10 mg BID  Inpatient Diabetes Program Recommendations:    Insulin: Please consider ordering Novolog 3 units TID with meals for meal coverage if patient eats at least 50% of meals.  Diet: Currently ordered Regular diet. If appropriate, please discontinue Regular diet and order Carb Modified diet.  Thanks, Barnie Alderman, RN, MSN, CDE Diabetes Coordinator Inpatient Diabetes Program [phone] (Team Pager from 8am to 5pm)

## 2020-05-17 NOTE — Progress Notes (Signed)
Occupational Therapy Treatment Patient Details Name: Jeffery Fernandez MRN: [id number] DOB: Oct 07, 1945 Today's Date: 05/17/2020    History of present illness 75 y/o male who recently had fall and has had increasing back and bilateral leg pain. MRI found L4 compression fx with spinal stenosis. Patient now s/p L4-5 decompression and fusion on 4/14. 4/16 somnolent, confused; head CT no acute changes  PMH: CKD, depression, DM, HLD, HTN, OSA, nephrolithiasis.   OT comments  Patient supine in bed and agreeable to OT/PT session.  Patient completing transitioning to EOB with step by step cueing and min guard, once sitting EOB donned back brace with min assist and requires max assist +2 for LB dressing (safety in standing).  Completing transfers with mod assist +2 from EOB and min assist +2 from chair at sink.  Grooming at sink, demonstrating poor tolerance for static standing and difficulty maintaining upright posture without BUE support- completed handwashing sitting.  Disoriented to place today, difficult to reorient and pt masking deficits during session by joking. Requires cueing for safety, problem solving throughout session.  Will follow.    Follow Up Recommendations  CIR;Supervision/Assistance - 24 hour    Equipment Recommendations  Other (comment) (TBD)    Recommendations for Other Services      Precautions / Restrictions Precautions Precautions: Fall;Back Precaution Booklet Issued: No Precaution Comments: reviewed 3/3 back precautions. Able to recall 2/3 precautions with increased time Required Braces or Orthoses: Spinal Brace Spinal Brace: Lumbar corset;Applied in sitting position Restrictions Weight Bearing Restrictions: No       Mobility Bed Mobility Overal bed mobility: Needs Assistance Bed Mobility: Rolling;Sidelying to Sit;Sit to Sidelying Rolling: Min guard Sidelying to sit: Min guard     Sit to sidelying: Mod assist General bed mobility comments: modA to return to  sidelying to bring LEs onto bed and prevent trunk from rolling prior to LEs bring on bed    Transfers Overall transfer level: Needs assistance Equipment used: Rolling walker (2 wheeled) Transfers: Sit to/from Stand Sit to Stand: Mod assist;+2 physical assistance;+2 safety/equipment;Min assist         General transfer comment: from EOB required modA+2. MinA from chair at sink. hand placement and posture cueing required    Balance Overall balance assessment: Needs assistance Sitting-balance support: Feet supported;No upper extremity supported Sitting balance-Leahy Scale: Fair Sitting balance - Comments: close supervision-min guard for static sitting balance   Standing balance support: During functional activity;Bilateral upper extremity supported Standing balance-Leahy Scale: Poor Standing balance comment: reliant on UE support and external assist                           ADL either performed or assessed with clinical judgement   ADL Overall ADL's : Needs assistance/impaired     Grooming: Wash/dry hands;Minimal assistance;Standing Grooming Details (indicate cue type and reason): min assist to stand to wash hands, completed in sitting as poor balance/tolerance to standing without B UE support         Upper Body Dressing : Minimal assistance;Sitting Upper Body Dressing Details (indicate cue type and reason): requires min assist to don brace Lower Body Dressing: Maximal assistance;Sit to/from stand;+2 for safety/equipment Lower Body Dressing Details (indicate cue type and reason): requires assist for socks, requires +2 in standing as relies on BUE support Toilet Transfer: Moderate assistance;+2 for physical assistance;+2 for safety/equipment;Ambulation Toilet Transfer Details (indicate cue type and reason): simulated in room         Functional mobility  during ADLs: Minimal assistance;Moderate assistance;+2 for physical assistance;+2 for safety/equipment;Rolling  walker;Cueing for sequencing;Cueing for safety       Vision   Additional Comments: 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, minimal peripheral vision only   Perception     Praxis      Cognition Arousal/Alertness: Awake/alert Behavior During Therapy: WFL for tasks assessed/performed Overall Cognitive Status: Impaired/Different from baseline Area of Impairment: Orientation;Attention;Memory;Following commands;Awareness;Problem solving                 Orientation Level: Disoriented to;Place Current Attention Level: Sustained Memory: Decreased recall of precautions;Decreased short-term memory Following Commands: Follows one step commands consistently;Follows multi-step commands with increased time   Awareness: Intellectual Problem Solving: Slow processing;Difficulty sequencing;Requires verbal cues General Comments: Disoriented to place stating "home". Attempts to reorient with difficulty remembering place--attempting to mask deficits by joking. Requires increased time for processing commands, poor awareness to safety.        Exercises     Shoulder Instructions       General Comments family present and supportive    Pertinent Vitals/ Pain       Pain Assessment: Faces Faces Pain Scale: Hurts little more Pain Location: back Pain Descriptors / Indicators: Grimacing;Operative site guarding Pain Intervention(s): Limited activity within patient's tolerance;Monitored during session;Repositioned  Home Living                                          Prior Functioning/Environment              Frequency  Min 2X/week        Progress Toward Goals  OT Goals(current goals can now be found in the care plan section)  Progress towards OT goals: Progressing toward goals  Acute Rehab OT Goals Patient Stated Goal: to go home OT Goal Formulation: With patient/family  Plan Discharge plan remains appropriate;Frequency remains appropriate    Co-evaluation     PT/OT/SLP Co-Evaluation/Treatment: Yes Reason for Co-Treatment: For patient/therapist safety;To address functional/ADL transfers PT goals addressed during session: Mobility/safety with mobility;Balance;Proper use of DME OT goals addressed during session: ADL's and self-care      AM-PAC OT "6 Clicks" Daily Activity     Outcome Measure   Help from another person eating meals?: None Help from another person taking care of personal grooming?: A Little Help from another person toileting, which includes using toliet, bedpan, or urinal?: A Lot Help from another person bathing (including washing, rinsing, drying)?: A Lot Help from another person to put on and taking off regular upper body clothing?: A Little Help from another person to put on and taking off regular lower body clothing?: A Lot 6 Click Score: 16    End of Session Equipment Utilized During Treatment: Rolling walker;Gait belt;Back brace  OT Visit Diagnosis: Unsteadiness on feet (R26.81);Pain;Other symptoms and signs involving cognitive function Pain - part of body:  (back)   Activity Tolerance Patient tolerated treatment well   Patient Left in bed;with call bell/phone within reach;with bed alarm set;with family/visitor present   Nurse Communication Mobility status        Time: 1245-8099 OT Time Calculation (min): 34 min  Charges: OT General Charges $OT Visit: 1 Visit OT Treatments $Self Care/Home Management : 8-22 mins  Jolaine Artist, OT [place] Pager [phone] Office [place] 05/17/2020, 4:12 PM

## 2020-05-17 NOTE — Progress Notes (Signed)
Inpatient Rehab Admissions Coordinator:   Insurance has denied case for inpatient rehab (CIR), but state they will approve lower level of care (SNF) for rehab. I will notify the Pt. Of this plan. CIR to sign off.  Clemens Catholic, [place], [place] Admissions Coordinator  [phone] ([place]) [phone] (office)

## 2020-05-17 NOTE — Progress Notes (Signed)
Physical Therapy Treatment Patient Details Name: Jeffery Fernandez MRN: [id number] DOB: 03-23-1945 Today's Date: 05/17/2020    History of Present Illness 75 y/o male who recently had fall and has had increasing back and bilateral leg pain. MRI found L4 compression fx with spinal stenosis. Patient now s/p L4-5 decompression and fusion on 4/14. 4/16 somnolent, confused; head CT no acute changes  PMH: CKD, depression, DM, HLD, HTN, OSA, nephrolithiasis.    PT Comments    Patient continues to demonstrate impaired cognition with disoriented to place. Attempts were made to reorient patient but demos STM deficits. Patient requires +2 for safety with OOB mobility with RW. Patient continues to be limited by impaired balance and weakness. Updated discharge recommendation to SNF as insurance denied CIR.     Follow Up Recommendations  SNF;Supervision/Assistance - 24 hour (insurance denied CIR)     Equipment Recommendations  None recommended by PT    Recommendations for Other Services       Precautions / Restrictions Precautions Precautions: Fall;Back Precaution Booklet Issued: No Precaution Comments: reviewed 3/3 back precautions. Able to recall 2/3 precautions with increased time Required Braces or Orthoses: Spinal Brace Spinal Brace: Lumbar corset;Applied in sitting position Restrictions Weight Bearing Restrictions: No    Mobility  Bed Mobility Overal bed mobility: Needs Assistance Bed Mobility: Rolling;Sidelying to Sit;Sit to Sidelying Rolling: Min guard Sidelying to sit: Min guard     Sit to sidelying: Mod assist General bed mobility comments: modA to return to sidelying to bring LEs onto bed and prevent trunk from rolling prior to LEs bring on bed    Transfers Overall transfer level: Needs assistance Equipment used: Rolling Tiaira Arambula (2 wheeled) Transfers: Sit to/from Stand Sit to Stand: Mod assist;+2 physical assistance;+2 safety/equipment;Min assist         General transfer  comment: initial sit to stand from EOB required modA+2. MinA from chair at sink. Cues required for hand placement each time  Ambulation/Gait Ambulation/Gait assistance: Min assist;+2 safety/equipment Gait Distance (Feet): 5 Feet (x5') Assistive device: Rolling Valery Chance (2 wheeled) Gait Pattern/deviations: Step-through pattern;Decreased step length - right;Decreased step length - left;Shuffle;Trunk flexed Gait velocity: decr   General Gait Details: minA+2 for balance, RW management, and directions/sequencing   Stairs             Wheelchair Mobility    Modified Rankin (Stroke Patients Only)       Balance Overall balance assessment: Needs assistance Sitting-balance support: Feet supported;No upper extremity supported Sitting balance-Leahy Scale: Fair Sitting balance - Comments: close supervision-min guard for static sitting balance   Standing balance support: During functional activity;Bilateral upper extremity supported Standing balance-Leahy Scale: Poor Standing balance comment: reliant on UE support and external assist                            Cognition Arousal/Alertness: Awake/alert Behavior During Therapy: WFL for tasks assessed/performed Overall Cognitive Status: Impaired/Different from baseline Area of Impairment: Orientation;Attention;Memory;Following commands;Awareness;Problem solving                 Orientation Level: Disoriented to;Place Current Attention Level: Sustained Memory: Decreased recall of precautions;Decreased short-term memory Following Commands: Follows one step commands consistently;Follows multi-step commands with increased time   Awareness: Intellectual Problem Solving: Slow processing;Difficulty sequencing;Requires verbal cues General Comments: Disoriented to place stating "home". Attempts to reorient with difficulty remembering place. Requires increased time for processing commands      Exercises      General  Comments  Pertinent Vitals/Pain Pain Assessment: Faces Faces Pain Scale: Hurts little more Pain Location: back Pain Descriptors / Indicators: Grimacing;Operative site guarding Pain Intervention(s): Monitored during session;Repositioned;Limited activity within patient's tolerance    Home Living                      Prior Function            PT Goals (current goals can now be found in the care plan section) Acute Rehab PT Goals Patient Stated Goal: to go home PT Goal Formulation: With patient Time For Goal Achievement: 05/26/20 Potential to Achieve Goals: Good Progress towards PT goals: Progressing toward goals    Frequency    Min 5X/week      PT Plan Current plan remains appropriate    Co-evaluation PT/OT/SLP Co-Evaluation/Treatment: Yes Reason for Co-Treatment: For patient/therapist safety;To address functional/ADL transfers PT goals addressed during session: Mobility/safety with mobility;Balance;Proper use of DME        AM-PAC PT "6 Clicks" Mobility   Outcome Measure  Help needed turning from your back to your side while in a flat bed without using bedrails?: A Little Help needed moving from lying on your back to sitting on the side of a flat bed without using bedrails?: A Little Help needed moving to and from a bed to a chair (including a wheelchair)?: A Little Help needed standing up from a chair using your arms (e.g., wheelchair or bedside chair)?: A Little Help needed to walk in hospital room?: A Little Help needed climbing 3-5 steps with a railing? : A Lot 6 Click Score: 17    End of Session Equipment Utilized During Treatment: Gait belt;Back brace Activity Tolerance: Patient tolerated treatment well Patient left: in bed;with call bell/phone within reach;with bed alarm set;with family/visitor present Nurse Communication: Mobility status PT Visit Diagnosis: Unsteadiness on feet (R26.81);Muscle weakness (generalized) (M62.81);History of  falling (Z91.81)     Time: 4163-8453 PT Time Calculation (min) (ACUTE ONLY): 34 min  Charges:  $Therapeutic Activity: 8-22 mins                     Ayelet Gruenewald A. Gilford Rile PT, DPT Acute Rehabilitation Services Pager [phone] Office [phone]    Linna Hoff 05/17/2020, 3:44 PM

## 2020-05-17 NOTE — Progress Notes (Signed)
   Providing Compassionate, Quality Care - Together   Subjective: Patient reports no issues overnight. His wife is at the bedside.  Objective: Vital signs in last 24 hours: Temp:  [97.8 F (36.6 C)-99.7 F (37.6 C)] 97.8 F (36.6 C) (04/20 0804) Pulse Rate:  [68-76] 75 (04/20 0804) Resp:  [18-20] 18 (04/20 0804) BP: (81-140)/(37-76) 140/76 (04/20 0804) SpO2:  [87 %-94 %] 91 % (04/20 0804)  Intake/Output from previous day: 04/19 0701 - 04/20 0700 In: -  Out: 1300 [Urine:1300] Intake/Output this shift: Total I/O In: -  Out: 250 [Urine:250]  Alert and oriented Speech clear, fluent MAE, Strength and sensation are intact Incision dry and intact, few SteriStrips remaining   Assessment/Plan: Patient is six days status post L4-5 fusion by Dr. Arnoldo Morale. The hope is for [facility] CIR if approved by insurance. If not approved, the plan will be for SNF.   LOS: 6 days      Viona Gilmore, DNP, AGNP-C Nurse Practitioner  [facility] [place]. [address] [place], [place], [place] [address] P: [phone]    F: [phone]  05/17/2020, 10:56 AM

## 2020-05-17 NOTE — NC FL2 (Signed)
[place] MEDICAID FL2 LEVEL OF CARE SCREENING TOOL     IDENTIFICATION  Patient Name: Jeffery Fernandez Birthdate: Dec 17, 1945 Sex: male Admission Date (Current Location): 05/11/2020  [facility] and [place] Number:  Herbalist and Address:  [facility] [place]. [facility], [place] [address], [place], [place] [address]      Provider Number: [id number]  Attending Physician Name and Address:  Newman Pies, MD  Relative Name and Phone Number:       Current Level of Care: Hospital Recommended Level of Care: [place] Prior Approval Number:    Date Approved/Denied:   PASRR Number: [id number]  Discharge Plan: SNF    Current Diagnoses: Patient Active Problem List   Diagnosis Date Noted  . 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 Lumbar compression fracture ([place]) 01/19/2018  . White coat syndrome with diagnosis of hypertension 09/19/2017  . Normocytic anemia 04/23/2017  . Heme positive stool 04/23/2017  . Cervical spondylosis with myelopathy and radiculopathy 06/26/2016  . Spinal stenosis of cervical region 06/26/2016  . Personal history of colonic polyps 06/14/2011  . CAD (coronary artery disease) 07/10/2010  . OSA (obstructive sleep apnea) 06/10/2010  . Hypertension 06/10/2010  . COPD (chronic obstructive pulmonary disease) ([place]) 06/10/2010  . Hyperlipidemia 06/10/2010  . Retinal artery occlusion 06/10/2010    Orientation RESPIRATION BLADDER Height & Weight     Self,Time,Place  Normal Incontinent Weight: 200 lb (90.7 kg) Height:  5\' 9"  (175.3 cm)  BEHAVIORAL SYMPTOMS/MOOD NEUROLOGICAL BOWEL NUTRITION STATUS      Continent Diet (see DC summary)  AMBULATORY STATUS COMMUNICATION OF NEEDS Skin   Extensive Assist Verbally Surgical wounds (closed back, honeycomb dressing; change PRN)                       Personal Care Assistance Level of  Assistance  Bathing,Feeding,Dressing Bathing Assistance: Limited assistance Feeding assistance: Independent Dressing Assistance: Limited assistance     Functional Limitations Info  Sight,Hearing Sight Info: Impaired Hearing Info: Impaired      SPECIAL CARE FACTORS FREQUENCY  PT (By licensed PT),OT (By licensed OT)     PT Frequency: 5x/wk OT Frequency: 5x/wk            Contractures Contractures Info: Not present    Additional Factors Info  Code Status,Allergies,Psychotropic,Insulin Sliding Scale Code Status Info: Full Allergies Info: Penicillins Psychotropic Info: Zoloft 100mg  daily Insulin Sliding Scale Info: see DC summary       Current Medications (05/17/2020):  This is the current hospital active medication list Current Facility-Administered Medications  Medication Dose Route Frequency Provider Last Rate Last Admin  . 0.9 %  sodium chloride infusion  250 mL Intravenous Continuous Newman Pies, MD 1 mL/hr at 05/12/20 0449 250 mL at 05/12/20 0449  . acetaminophen (TYLENOL) tablet 650 mg  650 mg Oral Q4H PRN Newman Pies, MD       Or  . acetaminophen (TYLENOL) suppository 650 mg  650 mg Rectal Q4H PRN Newman Pies, MD      . alum & mag hydroxide-simeth (MAALOX/MYLANTA) 200-200-20 MG/5ML suspension 30 mL  30 mL Oral Q6H PRN Newman Pies, MD   30 mL at 05/12/20 1303  . amLODipine (NORVASC) tablet 5 mg  5 mg Oral Daily Newman Pies, MD   5 mg at 05/17/20 0847  . atorvastatin (LIPITOR) tablet 20 mg  20 mg Oral QHS Newman Pies,  MD   20 mg at 05/16/20 2115  . bisacodyl (DULCOLAX) suppository 10 mg  10 mg Rectal Daily PRN Newman Pies, MD   10 mg at 05/15/20 1305  . carvedilol (COREG) tablet 25 mg  25 mg Oral BID WC Newman Pies, MD   25 mg at 05/17/20 0847  . cyclobenzaprine (FLEXERIL) tablet 10 mg  10 mg Oral TID PRN Newman Pies, MD   10 mg at 05/14/20 1222  . docusate sodium (COLACE) capsule 100 mg  100 mg Oral BID Newman Pies, MD    100 mg at 05/17/20 0847  . feeding supplement (ENSURE ENLIVE / ENSURE PLUS) liquid 237 mL  237 mL Oral BID BM Newman Pies, MD   237 mL at 05/17/20 0848  . fluticasone (FLONASE) 50 MCG/ACT nasal spray 1 spray  1 spray Each Nare Daily PRN Newman Pies, MD      . glipiZIDE (GLUCOTROL XL) 24 hr tablet 10 mg  10 mg Oral BID Newman Pies, MD   10 mg at 05/17/20 0846  . hydrochlorothiazide (MICROZIDE) capsule 12.5 mg  12.5 mg Oral Daily Newman Pies, MD   12.5 mg at 05/17/20 0846  . insulin aspart (novoLOG) injection 0-20 Units  0-20 Units Subcutaneous Q4H Newman Pies, MD   4 Units at 05/17/20 1304  . lisinopril (ZESTRIL) tablet 40 mg  40 mg Oral Daily Newman Pies, MD   40 mg at 05/17/20 0846  . menthol-cetylpyridinium (CEPACOL) lozenge 3 mg  1 lozenge Oral PRN Newman Pies, MD       Or  . phenol [facility]) mouth spray 1 spray  1 spray Mouth/Throat PRN Newman Pies, MD      . morphine 4 MG/ML injection 4 mg  4 mg Intravenous Q2H PRN Newman Pies, MD      . multivitamin with minerals tablet 1 tablet  1 tablet Oral Daily Newman Pies, MD   1 tablet at 05/17/20 [phone]  . ondansetron (ZOFRAN) tablet 4 mg  4 mg Oral Q6H PRN Newman Pies, MD       Or  . ondansetron [facility]) injection 4 mg  4 mg Intravenous Q6H PRN Newman Pies, MD      . oxyCODONE (Oxy IR/ROXICODONE) immediate release tablet 10 mg  10 mg Oral Q3H PRN Newman Pies, MD   10 mg at 05/15/20 2127  . oxyCODONE (Oxy IR/ROXICODONE) immediate release tablet 5 mg  5 mg Oral Q3H PRN Newman Pies, MD   5 mg at 05/17/20 0847  . pantoprazole (PROTONIX) EC tablet 40 mg  40 mg Oral Daily Newman Pies, MD   40 mg at 05/17/20 0845  . polyethylene glycol (MIRALAX / GLYCOLAX) packet 17 g  17 g Oral Daily PRN Newman Pies, MD   17 g at 05/14/20 2059  . sertraline (ZOLOFT) tablet 100 mg  100 mg Oral Daily Newman Pies, MD   100 mg at 05/17/20 0846  . sodium chloride flush (NS) 0.9 % injection  3 mL  3 mL Intravenous Q12H Newman Pies, MD   3 mL at 05/17/20 0855  . sodium chloride flush (NS) 0.9 % injection 3 mL  3 mL Intravenous PRN Newman Pies, MD      . terazosin (HYTRIN) capsule 5 mg  5 mg Oral QHS Newman Pies, MD   5 mg at 05/16/20 2118     Discharge Medications: Please see discharge summary for a list of discharge medications.  Relevant Imaging Results:  Relevant Lab Results:   Additional Information SS#: [id number]  Geralynn Ochs, LCSW

## 2020-05-17 NOTE — Progress Notes (Signed)
Inpatient Rehab Admissions Coordinator:   I received notification from Pt.'s insurance that they have issued a tentative denial for admission to inpatient rehab, with the option to complete a peer-to-peer by end of business today. I have  Reached out to treatment team to see if MD or NP wishes to complete that and will set it up if I hear back.   Clemens Catholic, [place], [place] Admissions Coordinator  [phone] ([place]) [phone] (office)

## 2020-05-17 NOTE — Plan of Care (Signed)

## 2020-05-18 LAB — GLUCOSE, CAPILLARY
Glucose-Capillary: 103 mg/dL — ABNORMAL HIGH (ref 70–99)
Glucose-Capillary: 170 mg/dL — ABNORMAL HIGH (ref 70–99)
Glucose-Capillary: 217 mg/dL — ABNORMAL HIGH (ref 70–99)
Glucose-Capillary: 256 mg/dL — ABNORMAL HIGH (ref 70–99)
Glucose-Capillary: 44 mg/dL — CL (ref 70–99)
Glucose-Capillary: 92 mg/dL (ref 70–99)
Glucose-Capillary: 99 mg/dL (ref 70–99)

## 2020-05-18 NOTE — TOC Progression Note (Signed)
Transition of Care [facility]) - Progression Note    Patient Details  Name: Jeffery Fernandez MRN: [id number] Date of Birth: 03/15/1945  [facility]) CM/SW Contact  Pollie Friar, RN Phone Number: 05/18/2020, 2:03 PM  Clinical Narrative:    Recommendations have changed to SNF rehab since pt denied for CIR.  CM provided the patient, wife and daughter with SNF choices. They have selected [place]. Claiborne Billings at [facility] verified acceptance. Cm has initiated [facility], clinical through [employer]. Covid ordered for tomorrow admit.  TOC following.     Barriers to Discharge: No Barriers Identified  Expected Discharge Plan and Services                                     HH Arranged: PT [place] Agency: [place] Date [facility] Agency Contacted: 05/12/20   Representative spoke with at [place]: Jenny Reichmann   Social Determinants of Health ([place]) Interventions    Readmission Risk Interventions No flowsheet data found.

## 2020-05-18 NOTE — Progress Notes (Signed)
   Providing Compassionate, Quality Care - Together   Subjective: Patient resting comfortably. His wife is at the bedside.  Objective: Vital signs in last 24 hours: Temp:  [98 F (36.7 C)-99.3 F (37.4 C)] 98.1 F (36.7 C) (04/21 0801) Pulse Rate:  [63-71] 71 (04/21 0801) Resp:  [16-20] 19 (04/21 0801) BP: (103-132)/(57-64) 126/64 (04/21 0801) SpO2:  [90 %-99 %] 94 % (04/21 0801)  Intake/Output from previous day: 04/20 0701 - 04/21 0700 In: -  Out: 250 [Urine:250] Intake/Output this shift: Total I/O In: -  Out: 750 [Urine:750]  Alert and oriented Speech clear, fluent MAE, Strength and sensation are intact Incision clean, dry and intact  Lab Results: No results for input(s): WBC, HGB, HCT, PLT in the last 72 hours. BMET No results for input(s): NA, K, CL, CO2, GLUCOSE, BUN, CREATININE, CALCIUM in the last 72 hours.  Studies/Results: No results found.  Assessment/Plan: Patient is seven days status post L4-5 fusion by Dr. Arnoldo Morale. [place] CIR was denied by insurance. Mrs. Vonderhaar and her daughter have been discussing taking Mr. Jeffery Fernandez home. They have concerns about their ability to help him mobilize at home. They are leaning more towards skilled nursing facility. They are narrowing down the available options and will let CSW know their decision.   LOS: 7 days    Viona Gilmore, DNP, AGNP-C Nurse Practitioner  [facility] [place] [address], [place], [place], [place] [address] P: [phone]    F: [phone]  05/18/2020, 10:11 AM

## 2020-05-18 NOTE — Progress Notes (Signed)
Physical Therapy Treatment Patient Details Name: Jeffery Fernandez MRN: [id number] DOB: 1945/11/07 Today's Date: 05/18/2020    History of Present Illness 75 y/o male who recently had fall and has had increasing back and bilateral leg pain. MRI found L4 compression fx with spinal stenosis. Patient now s/p L4-5 decompression and fusion on 4/14. 4/16 somnolent, confused; head CT no acute changes  PMH: CKD, depression, DM, HLD, HTN, OSA, nephrolithiasis.    PT Comments    Patient continues be confused at times and disoriented to place. Reviewed back precautions with patient able to recall 2/3 at beginning of session and 0/3 at end of session. Patient required modA to stand from low bed surface and toilet. Patient ambulating short distances with RW and minA for balance and RW management. Continues to require constant cueing for maintaining back precautions. Continue to recommend SNF for ongoing Physical Therapy.       Follow Up Recommendations  SNF;Supervision/Assistance - 24 hour (insurance denied CIR)     Equipment Recommendations  None recommended by PT    Recommendations for Other Services       Precautions / Restrictions Precautions Precautions: Fall;Back Precaution Booklet Issued: No Precaution Comments: reviewed 3/3 back precautions. Able to recall 2/3 precautions with increased time Required Braces or Orthoses: Spinal Brace Spinal Brace: Lumbar corset;Applied in sitting position Restrictions Weight Bearing Restrictions: No    Mobility  Bed Mobility Overal bed mobility: Needs Assistance Bed Mobility: Rolling;Sidelying to Sit Rolling: Min guard Sidelying to sit: Min guard       General bed mobility comments: min guard for safety. Good recall of log roll technique    Transfers Overall transfer level: Needs assistance Equipment used: Rolling Mariza Bourget (2 wheeled) Transfers: Sit to/from Stand Sit to Stand: Mod assist         General transfer comment: modA to rise from low  EOB and toilet with cues for hand placement  Ambulation/Gait Ambulation/Gait assistance: Min assist Gait Distance (Feet): 12 Feet (4') Assistive device: Rolling Omaree Fuqua (2 wheeled) Gait Pattern/deviations: Step-through pattern;Decreased step length - right;Decreased step length - left;Shuffle;Trunk flexed Gait velocity: decr   General Gait Details: minA for balance and RW management. Constant cues for RW proximity. Ambulated to bathroom and out of bathroom door but reports weakness. Chair brought to patient to sit   Stairs             Wheelchair Mobility    Modified Rankin (Stroke Patients Only)       Balance Overall balance assessment: Needs assistance Sitting-balance support: Feet supported;No upper extremity supported Sitting balance-Leahy Scale: Fair Sitting balance - Comments: close supervision-min guard for static sitting balance   Standing balance support: During functional activity;Bilateral upper extremity supported Standing balance-Leahy Scale: Poor Standing balance comment: reliant on UE support and external assist                            Cognition Arousal/Alertness: Awake/alert Behavior During Therapy: WFL for tasks assessed/performed Overall Cognitive Status: Impaired/Different from baseline Area of Impairment: Orientation;Attention;Memory;Following commands;Awareness;Problem solving                 Orientation Level: Disoriented to;Place Current Attention Level: Sustained Memory: Decreased recall of precautions;Decreased short-term memory Following Commands: Follows one step commands consistently;Follows multi-step commands with increased time     Problem Solving: Slow processing;Difficulty sequencing;Requires verbal cues General Comments: Disoriented to place stating "hospital" but believes in [place]. Attempt to reorient with patient stating "yeah  I know that". Poor awareness of safety and increased time for processing  commands      Exercises      General Comments        Pertinent Vitals/Pain Pain Assessment: Faces Faces Pain Scale: Hurts little more Pain Location: back Pain Descriptors / Indicators: Grimacing;Operative site guarding Pain Intervention(s): Monitored during session;Repositioned    Home Living                      Prior Function            PT Goals (current goals can now be found in the care plan section) Acute Rehab PT Goals Patient Stated Goal: to go home PT Goal Formulation: With patient Time For Goal Achievement: 05/26/20 Potential to Achieve Goals: Good Progress towards PT goals: Progressing toward goals    Frequency    Min 5X/week      PT Plan Current plan remains appropriate    Co-evaluation              AM-PAC PT "6 Clicks" Mobility   Outcome Measure  Help needed turning from your back to your side while in a flat bed without using bedrails?: A Little Help needed moving from lying on your back to sitting on the side of a flat bed without using bedrails?: A Little Help needed moving to and from a bed to a chair (including a wheelchair)?: A Little Help needed standing up from a chair using your arms (e.g., wheelchair or bedside chair)?: A Lot Help needed to walk in hospital room?: A Little Help needed climbing 3-5 steps with a railing? : A Lot 6 Click Score: 16    End of Session Equipment Utilized During Treatment: Gait belt;Back brace Activity Tolerance: Patient tolerated treatment well Patient left: in chair;with call bell/phone within reach;with nursing/sitter in room (in care of NT) Nurse Communication: Mobility status PT Visit Diagnosis: Unsteadiness on feet (R26.81);Muscle weakness (generalized) (M62.81);History of falling (Z91.81)     Time: 8403-7543 PT Time Calculation (min) (ACUTE ONLY): 33 min  Charges:  $Gait Training: 8-22 mins $Therapeutic Activity: 8-22 mins                     Jeneal Vogl A. Gilford Rile PT, DPT Acute  Rehabilitation Services Pager [phone] Office [phone]    Linna Hoff 05/18/2020, 1:13 PM

## 2020-05-19 LAB — GLUCOSE, CAPILLARY
Glucose-Capillary: 112 mg/dL — ABNORMAL HIGH (ref 70–99)
Glucose-Capillary: 119 mg/dL — ABNORMAL HIGH (ref 70–99)
Glucose-Capillary: 205 mg/dL — ABNORMAL HIGH (ref 70–99)
Glucose-Capillary: 97 mg/dL (ref 70–99)

## 2020-05-19 LAB — SARS CORONAVIRUS 2 (TAT 6-24 HRS): SARS Coronavirus 2: NEGATIVE

## 2020-05-19 MED ORDER — OXYCODONE HCL 5 MG PO TABS: 5.0000 mg | ORAL_TABLET | ORAL | 0 refills | Status: DC | PRN

## 2020-05-19 MED ORDER — CYCLOBENZAPRINE HCL 10 MG PO TABS: 10.0000 mg | ORAL_TABLET | Freq: Three times a day (TID) | ORAL | 0 refills | Status: DC | PRN

## 2020-05-19 MED ORDER — DOCUSATE SODIUM 100 MG PO CAPS: 100.0000 mg | ORAL_CAPSULE | Freq: Two times a day (BID) | ORAL | 0 refills | Status: DC

## 2020-05-19 NOTE — Progress Notes (Signed)
Attempted report at 1628 with no success. Will call back.

## 2020-05-19 NOTE — TOC Transition Note (Addendum)
Transition of Care [facility]) - CM/SW Discharge Note   Patient Details  Name: Jeffery Fernandez MRN: [id number] Date of Birth: 06-28-1945  [facility] of [facility]) CM/SW Contact:  Pollie Friar, RN Phone Number: 05/19/2020, 1:02 PM   Clinical Narrative:    Pt is discharging to [facility] SNF today. Pt will transport via [facility]. Bedside RN updated and d/c packet at the desk.   Room:303A Number for report: [phone]   Final next level of care: Skilled Nursing Facility Barriers to Discharge: No Barriers Identified   Patient Goals and CMS Choice   CMS Medicare.gov Compare Post Acute Care list provided to:: Patient Represenative (must comment) Choice offered to / list presented to : Adult Children,Spouse  Discharge Placement              Patient chooses bed at: [facility] Patient to be transferred to facility by: [place] Name of family member notified: wife is at the bedside and updated. Patient and family notified of of transfer: 05/19/20  Discharge Plan and Services                          HH Arranged: PT [facility] Agency: [place] Date [place]: 05/12/20   Representative spoke with at [place]: [place]) Interventions     Readmission Risk Interventions No flowsheet data found.

## 2020-05-19 NOTE — Progress Notes (Signed)
Physical Therapy Treatment Patient Details Name: Jeffery Fernandez MRN: [id number] DOB: Jul 11, 1945 Today's Date: 05/19/2020    History of Present Illness 75 y/o male who recently had fall and has had increasing back and bilateral leg pain. MRI found L4 compression fx with spinal stenosis. Patient now s/p L4-5 decompression and fusion on 4/14. 4/16 somnolent, confused; head CT no acute changes  PMH: CKD, depression, DM, HLD, HTN, OSA, nephrolithiasis.    PT Comments    Patient progressing towards physical therapy goals. Patient ambulated 100' with RW and minA + chair follow. Patient continues to demo impaired cognition but masks with humor. Reviewed back precautions with patient able to recall 1/3 precautions. MinA to don back brace. Continue to recommend SNF for ongoing Physical Therapy.       Follow Up Recommendations  SNF;Supervision/Assistance - 24 hour     Equipment Recommendations  None recommended by PT    Recommendations for Other Services       Precautions / Restrictions Precautions Precautions: Fall;Back Precaution Booklet Issued: No Precaution Comments: reviewed 3/3 back precautions. Able to recall 1/3 precautions with increased time Required Braces or Orthoses: Spinal Brace Spinal Brace: Lumbar corset;Applied in sitting position Restrictions Weight Bearing Restrictions: No    Mobility  Bed Mobility Overal bed mobility: Needs Assistance Bed Mobility: Rolling;Sidelying to Sit;Sit to Sidelying Rolling: Min guard Sidelying to sit: Min guard     Sit to sidelying: Min guard General bed mobility comments: min guard for safety and cues for log roll technique    Transfers Overall transfer level: Needs assistance Equipment used: Rolling [name] (2 wheeled) Transfers: Sit to/from Stand Sit to Stand: Min assist         General transfer comment: minA x 3 from varying surfaces  Ambulation/Gait Ambulation/Gait assistance: Min assist Gait Distance (Feet): 100  Feet Assistive device: Rolling [name] (2 wheeled) Gait Pattern/deviations: Step-through pattern;Decreased stride length;Trunk flexed;Wide base of support Gait velocity: decr   General Gait Details: minA for balance and RW management. Cues required for upright posture and RW proximity   Stairs             Wheelchair Mobility    Modified Rankin (Stroke Patients Only)       Balance Overall balance assessment: Needs assistance Sitting-balance support: Feet supported;No upper extremity supported Sitting balance-Leahy Scale: Fair Sitting balance - Comments: close supervision-min guard for static sitting balance Postural control: Posterior lean Standing balance support: During functional activity;Bilateral upper extremity supported Standing balance-Leahy Scale: Poor Standing balance comment: reliant on UE support and external assist                            Cognition Arousal/Alertness: Awake/alert Behavior During Therapy: WFL for tasks assessed/performed Overall Cognitive Status: Impaired/Different from baseline Area of Impairment: Attention;Memory;Following commands;Awareness;Problem solving;Safety/judgement                   Current Attention Level: Sustained Memory: Decreased recall of precautions;Decreased short-term memory Following Commands: Follows one step commands consistently;Follows multi-step commands with increased time Safety/Judgement: Decreased awareness of safety;Decreased awareness of deficits Awareness: Intellectual Problem Solving: Slow processing;Difficulty sequencing;Requires verbal cues;Requires tactile cues General Comments: oriented, but signficant confusion that he attempts to mask with joking, increased incoherent mumbling at times, difficulty motor planning requiring short phrases with multi-modal cueing in order to complete      Exercises      General Comments        Pertinent Vitals/Pain Pain  Assessment: Faces Faces  Pain Scale: Hurts little more Pain Location: back Pain Descriptors / Indicators: Grimacing;Operative site guarding Pain Intervention(s): Monitored during session;Repositioned;Limited activity within patient's tolerance    Home Living                      Prior Function            PT Goals (current goals can now be found in the care plan section) Acute Rehab PT Goals Patient Stated Goal: to go home PT Goal Formulation: With patient Time For Goal Achievement: 05/26/20 Potential to Achieve Goals: Good Progress towards PT goals: Progressing toward goals    Frequency    Min 5X/week      PT Plan Current plan remains appropriate    Co-evaluation              AM-PAC PT "6 Clicks" Mobility   Outcome Measure  Help needed turning from your back to your side while in a flat bed without using bedrails?: A Little Help needed moving from lying on your back to sitting on the side of a flat bed without using bedrails?: A Little Help needed moving to and from a bed to a chair (including a wheelchair)?: A Little Help needed standing up from a chair using your arms (e.g., wheelchair or bedside chair)?: A Little Help needed to walk in hospital room?: A Little Help needed climbing 3-5 steps with a railing? : A Lot 6 Click Score: 17    End of Session Equipment Utilized During Treatment: Gait belt;Back brace Activity Tolerance: Patient tolerated treatment well Patient left: in bed;with call bell/phone within reach;with bed alarm set;with family/visitor present Nurse Communication: Mobility status PT Visit Diagnosis: Unsteadiness on feet (R26.81);Muscle weakness (generalized) (M62.81);History of falling (Z91.81)     Time: 6468-0321 PT Time Calculation (min) (ACUTE ONLY): 25 min  Charges:  $Gait Training: 8-22 mins $Therapeutic Activity: 8-22 mins                     [name] A. Gilford Rile PT, DPT Acute Rehabilitation Services Pager [phone] Office  [phone]    Linna Hoff 05/19/2020, 5:15 PM

## 2020-05-19 NOTE — Progress Notes (Signed)
Occupational Therapy Treatment Patient Details Name: Jeffery Fernandez MRN: [id number] DOB: [birth year] Today's Date: 05/19/2020    History of present illness 75 y/o male who recently had fall and has had increasing back and bilateral leg pain. MRI found L4 compression fx with spinal stenosis. Patient now s/p L4-5 decompression and fusion on 4/14. 4/16 somnolent, confused; head CT no acute changes  PMH: CKD, depression, DM, HLD, HTN, OSA, nephrolithiasis.   OT comments  Patient continues to make progress towards goals in skilled OT session. Patient's session encompassed functional mobility in order to increase independence in ADLs and IADLs. Pt continues to demonstrate significant confusion, often speaking tangentially or mumbling and deflecting with humor. When engaging in functional mobility, pt requires max multi-modal cues in order to complete walker management and to complete safe transitions into sitting. Pt with increased fatigue completing functional mobility less than household distances requiring seated rest breaks, and due to need for less intensive therapy recommendations now updated to SNF. Therapy will continue to follow while in house.    Follow Up Recommendations  Supervision/Assistance - 24 hour;SNF    Equipment Recommendations  Other (comment) (defer to next venue)    Recommendations for Other Services      Precautions / Restrictions Precautions Precautions: Fall;Back Precaution Booklet Issued: No Precaution Comments: reviewed 3/3 back precautions. Able to recall 2/3 precautions with increased time Required Braces or Orthoses: Spinal Brace Spinal Brace: Lumbar corset;Applied in sitting position Restrictions Weight Bearing Restrictions: No       Mobility Bed Mobility               General bed mobility comments: in recliner upon arrival    Transfers Overall transfer level: Needs assistance Equipment used: Rolling walker (2 wheeled) Transfers: Sit to/from  Stand Sit to Stand: Mod assist         General transfer comment: mod A x3, signficant difficulty in transitioning to sitting without max multi-modal cues    Balance Overall balance assessment: Needs assistance Sitting-balance support: Feet supported;No upper extremity supported Sitting balance-Leahy Scale: Fair Sitting balance - Comments: close supervision-min guard for static sitting balance Postural control: Posterior lean Standing balance support: During functional activity;Bilateral upper extremity supported Standing balance-Leahy Scale: Poor Standing balance comment: reliant on UE support and external assist, short shuffled steps with close chair follow needed                           ADL either performed or assessed with clinical judgement   ADL Overall ADL's : Needs assistance/impaired                                     Functional mobility during ADLs: Minimal assistance;Moderate assistance;+2 for physical assistance;+2 for safety/equipment;Rolling walker;Cueing for sequencing;Cueing for safety General ADL Comments: session focused on functional mobility in order increase ADL/IADL independence     Vision       Perception     Praxis      Cognition Arousal/Alertness: Awake/alert Behavior During Therapy: WFL for tasks assessed/performed Overall Cognitive Status: Impaired/Different from baseline Area of Impairment: Attention;Memory;Following commands;Awareness;Problem solving;Safety/judgement                   Current Attention Level: Sustained Memory: Decreased recall of precautions;Decreased short-term memory Following Commands: Follows one step commands consistently;Follows multi-step commands with increased time Safety/Judgement: Decreased awareness of safety;Decreased awareness of  deficits Awareness: Intellectual Problem Solving: Slow processing;Difficulty sequencing;Requires verbal cues;Requires tactile cues General  Comments: oriented, but signficant confusion that he attempts to mask with joking, increased incoherent mumbling at times, difficulty motor planning requiring short phrases with multi-modal cueing in order to complete        Exercises     Shoulder Instructions       General Comments      Pertinent Vitals/ Pain       Pain Assessment: Faces Faces Pain Scale: Hurts little more Pain Location: back Pain Descriptors / Indicators: Grimacing;Operative site guarding Pain Intervention(s): Repositioned;Monitored during session;Limited activity within patient's tolerance  Home Living                                          Prior Functioning/Environment              Frequency  Min 2X/week        Progress Toward Goals  OT Goals(current goals can now be found in the care plan section)  Progress towards OT goals: Progressing toward goals  Acute Rehab OT Goals Patient Stated Goal: to go home OT Goal Formulation: With patient/family Time For Goal Achievement: 05/26/20 Potential to Achieve Goals: [place] Frequency remains appropriate;Discharge plan needs to be updated    Co-evaluation                 AM-PAC OT "6 Clicks" Daily Activity     Outcome Measure   Help from another person eating meals?: None Help from another person taking care of personal grooming?: A Little Help from another person toileting, which includes using toliet, bedpan, or urinal?: A Lot Help from another person bathing (including washing, rinsing, drying)?: A Lot Help from another person to put on and taking off regular upper body clothing?: A Little Help from another person to put on and taking off regular lower body clothing?: A Lot 6 Click Score: 16    End of Session Equipment Utilized During Treatment: Rolling walker;Gait belt;Back brace  OT Visit Diagnosis: Unsteadiness on feet (R26.81);Pain;Other symptoms and signs involving cognitive function Pain - part of body:   (back)   Activity Tolerance Patient limited by fatigue;Patient tolerated treatment well;Patient limited by lethargy   Patient Left in bed;with call bell/phone within reach;with bed alarm set;with family/visitor present   Nurse Communication Mobility status        Time: 1235-1300 OT Time Calculation (min): 25 min  Charges: OT General Charges $OT Visit: 1 Visit OT Treatments $Therapeutic Activity: 23-37 mins  [place]. Dauna Ziska, COTA/L Acute Rehabilitation Services [phone] [place] 05/19/2020, 2:24 PM

## 2020-05-19 NOTE — Discharge Summary (Signed)
Physician Discharge Summary     Providing Compassionate, Quality Care - Together   Patient ID: Jeffery Fernandez MRN: [id number] DOB/AGE: [birth year] 75 y.o.  Admit date: 05/11/2020 Discharge date: 05/19/2020  Admission Diagnoses: Lumbar compression fracture  Discharge Diagnoses:  Active Problems:   Lumbar compression fracture [facility])   Discharged Condition: good  Hospital Course: Patient underwent an L4-5 fusion by Dr. Arnoldo Morale on 05/11/2020. He was admitted to [phone] following recovery from anesthesia in the PACU. His postoperative course was complicated by pain control issues. This has improved. He has worked with both physical and occupational therapies who feel the patient is ready for discharge to a skilled nursing facility. He is ambulating with the aid of a walker. He is tolerating a normal diet. He is not having any bowel dysfunction. He has a history of urinary incontinence. His pain is well-controlled with oral pain medication. He is ready for discharge to the skilled nursing facility.   Consults: rehabilitation medicine  Significant Diagnostic Studies: DG Lumbar Spine 2-3 Views  Result Date: 05/11/2020 CLINICAL DATA:  Surgical fusion of L4-5. EXAM: LUMBAR SPINE - 2-3 VIEW; DG C-ARM 1-60 MIN FLUOROSCOPY TIME:  58 seconds. COMPARISON:  MRI of April 28, 2020. FINDINGS: Two intraoperative fluoroscopic images were obtained of the lower lumbar spine. These images demonstrate placement of bilateral intrapedicular screws at L4 and L5. IMPRESSION: Fluoroscopic guidance provided during lumbar surgery. Electronically Signed   By: Marijo Conception M.D.   On: 05/11/2020 16:18   CT HEAD WO CONTRAST  Result Date: 05/13/2020 CLINICAL DATA:  Mental status change, unknown cause; decreased mental status. EXAM: CT HEAD WITHOUT CONTRAST TECHNIQUE: Contiguous axial images were obtained from the base of the skull through the vertex without intravenous contrast. COMPARISON:  Brain MRI 04/10/2016. head CT  03/29/2016. FINDINGS: Brain: Mild cerebral and cerebellar atrophy. Advanced patchy and ill-defined hypoattenuation within the cerebral white matter is nonspecific, but compatible with chronic small vessel ischemic disease. There is no acute intracranial hemorrhage. No demarcated cortical infarct. No extra-axial fluid collection. No evidence of intracranial mass. No midline shift. Vascular: No hyperdense vessel.  Atherosclerotic calcifications Skull: Normal. Negative for fracture or focal lesion. Sinuses/Orbits: Visualized orbits show no acute finding. Mild bilateral ethmoid sinus mucosal thickening. IMPRESSION: No evidence of acute intracranial abnormality. Mild generalized parenchymal atrophy with advanced cerebral white matter chronic small vessel ischemic disease. Mild bilateral ethmoid sinus mucosal thickening. Electronically Signed   By: Kellie Simmering DO   On: 05/13/2020 17:17   MR LUMBAR SPINE WO CONTRAST  Result Date: 04/29/2020 CLINICAL DATA:  Compression fracture. Recent fall 2 weeks ago. Tingling in both legs. EXAM: MRI LUMBAR SPINE WITHOUT CONTRAST TECHNIQUE: Multiplanar, multisequence MR imaging of the lumbar spine was performed. No intravenous contrast was administered. COMPARISON:  Lumbar spine MRI 12/05/2017 FINDINGS: Segmentation:  Standard. Alignment:  Physiologic. Vertebrae: Incomplete burst fracture of L4 with approximately 50% height loss and diffuse bone marrow edema. There may be a small amount of epidural blood ventral to the spinal canal. The lumbar venous plexus is engorged at this level. There is an old compression deformity of L3 with vertebroplasty changes. Conus medullaris and cauda equina: Conus extends to the L1 level. Conus and cauda equina appear normal. Paraspinal and other soft tissues: Negative Disc levels: L1-L2: Normal disc space and facet joints. No spinal canal stenosis. No neural foraminal stenosis. L2-L3: Small disc bulge. No spinal canal stenosis. No neural foraminal  stenosis. L3-L4: Disc desiccation with intermediate sized bulge. Mild spinal canal stenosis.  Mild bilateral neural foraminal stenosis. L4-L5: Disc bulging combination with venous plexus engorgement and possible small amount of ventral epidural blood at L4 resulting in severe spinal canal stenosis. Severe bilateral neural foraminal stenosis. There is mass effect on the cauda equina at this level. L5-S1: Small disc bulge. No spinal canal stenosis. Mild left neural foraminal stenosis. Visualized sacrum: Normal. IMPRESSION: 1. Incomplete burst fracture of L4 with approximately 50% height loss and diffuse bone marrow edema. Possible small amount of ventral epidural blood. 2. 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 Mass effect on the cauda equina. 3. Old L3 compression fracture with vertebroplasty changes. These results will be called to the ordering clinician or representative by the Radiologist Assistant, and communication documented in the PACS or [employer]. Electronically Signed   By: Ulyses Jarred M.D.   On: 04/29/2020 00:07   DG Lumbar Spine 1 View  Result Date: 05/11/2020 CLINICAL DATA:  L4-5 decompression EXAM: LUMBAR SPINE - 1 VIEW COMPARISON:  Lumbar radiographs April 20, 2020; lumbar MRI April 28, 9145 FINDINGS: Metallic probe tip is posterior to the superior most aspect of the S1 vertebral body. There is chronic anterior wedging at L3, status post kyphoplasty. There is slight anterior wedging at L4, stable. No spondylolisthesis. No appreciable disc space narrowing. There is aortic atherosclerosis. IMPRESSION: Metallic probe tip is posterior to the proximal aspect of S1. Previous kyphoplasty at L3. Anterior wedging at L3 and L4 is stable. Aortic Atherosclerosis (ICD10-I70.0). Electronically Signed   By: Lowella Grip III M.D.   On: 05/11/2020 15:08   DG C-Arm 1-60 Min  Result Date: 05/11/2020 CLINICAL DATA:  Surgical fusion of L4-5. EXAM: LUMBAR SPINE - 2-3 VIEW; DG  C-ARM 1-60 MIN FLUOROSCOPY TIME:  58 seconds. COMPARISON:  MRI of April 28, 2020. FINDINGS: Two intraoperative fluoroscopic images were obtained of the lower lumbar spine. These images demonstrate placement of bilateral intrapedicular screws at L4 and L5. IMPRESSION: Fluoroscopic guidance provided during lumbar surgery. Electronically Signed   By: Marijo Conception M.D.   On: 05/11/2020 16:18   Intravitreal Injection, Pharmacologic Agent - OS - Left Eye  Result Date: 05/03/2020 Time Out 05/03/2020. 11:11 AM. Confirmed correct patient, procedure, site, and patient consented. Anesthesia Topical anesthesia was used. Anesthetic medications included Akten 3.5%. Procedure Preparation included Ofloxacin , Tobramycin 0.3%, 10% betadine to eyelids, 5% betadine to ocular surface. A 30 gauge needle was used. Injection: 2.5 mg Bevacizumab (AVASTIN) 2.5mg /0.24mL SOSY   NDC: 82956-213-08, Lot: [id number]   Route: Intravitreal, Site: Left Eye Post-op Post injection exam found visual acuity of at least counting fingers. The patient tolerated the procedure well. There were no complications. The patient received written and verbal post procedure care education. Post injection medications were not given.   Intravitreal Injection, Pharmacologic Agent - OS - Left Eye  Result Date: 03/22/2020 Time Out 03/22/2020. 10:40 AM. Confirmed correct patient, procedure, site, and patient consented. Anesthesia Topical anesthesia was used. Anesthetic medications included Akten 3.5%. Procedure Preparation included Ofloxacin , Tobramycin 0.3%, 10% betadine to eyelids, 5% betadine to ocular surface. A 30 gauge needle was used. Injection: 2.5 mg Bevacizumab (AVASTIN) 2.5mg /0.28mL SOSY   NDC: 62952-841-32, Lot: [id number]   Route: Intravitreal, Site: Left Eye Post-op Post injection exam found visual acuity of at least counting fingers. The patient tolerated the procedure well. There were no complications. The patient received written and verbal post procedure  care education. Post injection medications were not given.   OCT, Retina - OU - Both Eyes  Result Date: 05/03/2020 Right Eye Quality was good. Scan locations included subfoveal. Central Foveal Thickness: 238. Left Eye Quality was good. Scan locations included subfoveal. Central Foveal Thickness: 302. Findings include cystoid macular edema. 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  Repeat injection today extend interval examination to 8 or 9 weeks as patient preparing for back surgery  OCT, Retina - OU - Both Eyes  Result Date: 03/22/2020 Right Eye Quality was good. Scan locations included subfoveal. Central Foveal Thickness: 234. Progression has been stable. Findings include normal foveal contour. Left Eye Quality was good. Scan locations included subfoveal. Central Foveal Thickness: 311. Progression has been stable. Findings include abnormal foveal contour, cystoid macular edema. Notes CME nasal and superonasal to the fovea left eye, overall stable yet still active,post focal laser treatment will continue Avastin today as the final biologic effects of laser photocoagulation develop And now for 6 weeks post intravitreal Avastin, will repeat injection today for controlled CME secondary to BRVO    Treatments: surgery: Bilateral L4-5 laminotomy/foraminotomies/medial facetectomy to decompress the bilateral L5 nerve roots(the work required to do this was in addition to the work required to do the posterior lumbar interbody fusion because of the patient's spinal stenosis requiring a wide decompression of the nerve roots.);posterior nonsegmental instrumentation from L4 to L5 with globus fenestrated titanium pedicle screws and rods; posterior lateral arthrodesis at L4-5 with local morselized autograft bone, bone morphogenic protein-soaked collagen sponges, and globus bone graft extender.   Discharge Exam: Blood pressure 120/63, pulse 77, temperature 98.2 F (36.8  C), temperature source Oral, resp. rate 19, height 5\' 9"  (1.753 m), weight 90.7 kg, SpO2 97 %.   Alert and oriented Speech clear, fluent MAE, Strength and sensation are intact Incision clean, dry and intact   Disposition:    Allergies as of 05/19/2020      Reactions   Penicillins Other (See Comments)   Possibly rash - not sure Has patient had a PCN reaction causing immediate rash, facial/tongue/throat swelling, SOB or lightheadedness with hypotension:unknown Has patient had a PCN reaction causing severe rash involving mucus membranes or skin necrosis: Unknown Has patient had a PCN reaction that required hospitalization: Unknown Has patient had a PCN reaction occurring within the last 10 years: No Childhood reaction If all of the above answers are "NO", then may proceed with Cephalosporin use.      Medication List    STOP taking these medications   oxyCODONE-acetaminophen 5-325 MG tablet Commonly known as: PERCOCET/ROXICET     TAKE these medications   acetaminophen 500 MG tablet Commonly known as: TYLENOL Take 1,000 mg by mouth every 6 (six) hours as needed (for pain.).   amLODipine 5 MG tablet Commonly known as: NORVASC Take 5 mg by mouth daily. What changed: Another medication with the same name was removed. Continue taking this medication, and follow the directions you see here.   aspirin 325 MG EC tablet Take 325 mg by mouth daily.   atorvastatin 40 MG tablet Commonly known as: LIPITOR Take 20 mg by mouth at bedtime.   carvedilol 25 MG tablet Commonly known as: COREG TAKE 1 TABLET BY MOUTH 2 TIMES DAILY WITH A MEAL.   cyclobenzaprine 10 MG tablet Commonly known as: FLEXERIL Take 1 tablet (10 mg total) by mouth 3 (three) times daily as needed for muscle spasms.   docusate sodium 100 MG capsule Commonly known as: COLACE Take 1 capsule (100 mg total) by mouth 2 (two) times  daily.   fluticasone 50 MCG/ACT nasal spray Commonly known as: FLONASE Place 1  spray into both nostrils daily.   glipiZIDE 10 MG 24 hr tablet Commonly known as: GLUCOTROL XL Take 10 mg by mouth 2 (two) times daily.   hydrochlorothiazide 12.5 MG capsule Commonly known as: MICROZIDE TAKE 1 CAPSULE BY MOUTH EVERY DAY What changed:   how much to take  when to take this   insulin glargine 100 UNIT/ML injection Commonly known as: LANTUS Inject 40 Units into the skin daily.   lisinopril 40 MG tablet Commonly known as: ZESTRIL TAKE 1 TABLET BY MOUTH EVERY DAY   oxyCODONE 5 MG immediate release tablet Commonly known as: Oxy IR/ROXICODONE Take 1 tablet (5 mg total) by mouth every 4 (four) hours as needed for moderate pain ((score 4 to 6)).   sertraline 100 MG tablet Commonly known as: ZOLOFT Take 100 mg by mouth daily.   terazosin 5 MG capsule Commonly known as: HYTRIN TAKE 1 CAPSULE BY MOUTH EVERYDAY AT BEDTIME What changed: See the new instructions.   TURMERIC PO Take 1 tablet by mouth daily.       Contact information for follow-up providers    Care, [facility] Follow up.   Specialty: Home Health Services Why: The home health agency will contact you for the first home visit. Contact information: [place] [address] [phone]        Newman Pies, MD. Schedule an appointment as soon as possible for a visit in 3 week(s).   Specialty: Neurosurgery Contact information: [address] [place] [address] [phone]            Contact information for after-discharge care    Destination    [facility] .   Service: Skilled Nursing Contact information: [address] [address] [phone]                  Signed: Viona Gilmore, DNP, AGNP-C Nurse Practitioner  [facility] [place] [address], [place], [place] [address] P: [phone]    F: [phone]  05/19/2020, 9:40 AM

## 2020-05-19 NOTE — Discharge Instructions (Signed)
Wound Care Keep incision covered and dry for two days.    Do not put any creams, lotions, or ointments on incision. Leave steri-strips on back.  They will fall off by themselves.  Activity Walk each and every day, increasing distance each day. No lifting greater than 5 lbs.  Avoid excessive back motion. No driving for 2 weeks; may ride as a passenger locally.  Diet Resume your normal diet.   Return to Work Will be discussed at your follow up appointment.  Call Your Doctor If Any of These Occur Redness, drainage, or swelling at the wound.  Temperature greater than 101 degrees. Severe pain not relieved by pain medication. Incision starts to come apart.  Follow Up Appt Call today for appointment in 1-2 weeks [phone]) or for problems.  If you have any hardware placed in your spine, you will need an x-ray before your appointment.

## 2020-05-19 NOTE — Progress Notes (Signed)
Subjective: The patient is alert and pleasant.  His back is "sore".  I encouraged him to mobilize.  Objective: Vital signs in last 24 hours: Temp:  [97.7 F (36.5 C)-98.1 F (36.7 C)] 97.9 F (36.6 C) (04/22 0349) Pulse Rate:  [64-75] 73 (04/22 0349) Resp:  [18-20] 18 (04/22 0349) BP: (107-138)/(54-74) 132/66 (04/22 0349) SpO2:  [92 %-100 %] 100 % (04/22 0349) Estimated body mass index is 29.53 kg/m as calculated from the following:   Height as of this encounter: 5\' 9"  (1.753 m).   Weight as of this encounter: 90.7 kg.   Intake/Output from previous day: 04/21 0701 - 04/22 0700 In: -  Out: 1800 [Urine:1800] Intake/Output this shift: Total I/O In: -  Out: 1050 [Urine:1050]  Physical exam the patient is alert and oriented.  His strength is normal.  Lab Results: No results for input(s): WBC, HGB, HCT, PLT in the last 72 hours. BMET No results for input(s): NA, K, CL, CO2, GLUCOSE, BUN, CREATININE, CALCIUM in the last 72 hours.  Studies/Results: No results found.  Assessment/Plan: Postop day #8: We are awaiting skilled nursing facility placement.  LOS: 8 days     Ophelia Charter 05/19/2020, 6:44 AM

## 2020-05-19 NOTE — Plan of Care (Signed)

## 2020-05-30 ENCOUNTER — Emergency Department (HOSPITAL_COMMUNITY): Payer: Medicare HMO

## 2020-05-30 ENCOUNTER — Other Ambulatory Visit: Payer: Self-pay

## 2020-05-30 ENCOUNTER — Encounter (HOSPITAL_COMMUNITY): Payer: Self-pay | Admitting: Emergency Medicine

## 2020-05-30 ENCOUNTER — Inpatient Hospital Stay (HOSPITAL_COMMUNITY)
Admission: EM | Admit: 2020-05-30 | Discharge: 2020-06-09 | DRG: 863 | Disposition: A | Discharge status: Home Health | Payer: Medicare HMO | Attending: Neurosurgery | Admitting: Neurosurgery

## 2020-05-30 DIAGNOSIS — Z87891 Personal history of nicotine dependence: Secondary | ICD-10-CM | POA: Diagnosis not present

## 2020-05-30 DIAGNOSIS — M109 Gout, unspecified: Secondary | ICD-10-CM | POA: Diagnosis present

## 2020-05-30 DIAGNOSIS — E1122 Type 2 diabetes mellitus with diabetic chronic kidney disease: Secondary | ICD-10-CM | POA: Diagnosis present

## 2020-05-30 DIAGNOSIS — Z880 Allergy status to penicillin: Secondary | ICD-10-CM

## 2020-05-30 DIAGNOSIS — Z794 Long term (current) use of insulin: Secondary | ICD-10-CM | POA: Diagnosis not present

## 2020-05-30 DIAGNOSIS — T8130XA Disruption of wound, unspecified, initial encounter: Secondary | ICD-10-CM | POA: Diagnosis present

## 2020-05-30 DIAGNOSIS — Z8049 Family history of malignant neoplasm of other genital organs: Secondary | ICD-10-CM

## 2020-05-30 DIAGNOSIS — Z823 Family history of stroke: Secondary | ICD-10-CM

## 2020-05-30 DIAGNOSIS — Z8249 Family history of ischemic heart disease and other diseases of the circulatory system: Secondary | ICD-10-CM

## 2020-05-30 DIAGNOSIS — E1169 Type 2 diabetes mellitus with other specified complication: Secondary | ICD-10-CM | POA: Diagnosis not present

## 2020-05-30 DIAGNOSIS — I2510 Atherosclerotic heart disease of native coronary artery without angina pectoris: Secondary | ICD-10-CM | POA: Diagnosis present

## 2020-05-30 DIAGNOSIS — N179 Acute kidney failure, unspecified: Secondary | ICD-10-CM | POA: Diagnosis present

## 2020-05-30 DIAGNOSIS — Z79899 Other long term (current) drug therapy: Secondary | ICD-10-CM

## 2020-05-30 DIAGNOSIS — E785 Hyperlipidemia, unspecified: Secondary | ICD-10-CM | POA: Diagnosis present

## 2020-05-30 DIAGNOSIS — M1990 Unspecified osteoarthritis, unspecified site: Secondary | ICD-10-CM | POA: Diagnosis present

## 2020-05-30 DIAGNOSIS — N184 Chronic kidney disease, stage 4 (severe): Secondary | ICD-10-CM | POA: Diagnosis present

## 2020-05-30 DIAGNOSIS — Z820 Family history of epilepsy and other diseases of the nervous system: Secondary | ICD-10-CM | POA: Diagnosis not present

## 2020-05-30 DIAGNOSIS — Z20822 Contact with and (suspected) exposure to covid-19: Secondary | ICD-10-CM | POA: Diagnosis present

## 2020-05-30 DIAGNOSIS — Z833 Family history of diabetes mellitus: Secondary | ICD-10-CM

## 2020-05-30 DIAGNOSIS — Z7984 Long term (current) use of oral hypoglycemic drugs: Secondary | ICD-10-CM

## 2020-05-30 DIAGNOSIS — I129 Hypertensive chronic kidney disease with stage 1 through stage 4 chronic kidney disease, or unspecified chronic kidney disease: Secondary | ICD-10-CM | POA: Diagnosis present

## 2020-05-30 DIAGNOSIS — Z7982 Long term (current) use of aspirin: Secondary | ICD-10-CM

## 2020-05-30 DIAGNOSIS — J449 Chronic obstructive pulmonary disease, unspecified: Secondary | ICD-10-CM | POA: Diagnosis present

## 2020-05-30 DIAGNOSIS — Z981 Arthrodesis status: Secondary | ICD-10-CM | POA: Diagnosis not present

## 2020-05-30 DIAGNOSIS — T8149XA Infection following a procedure, other surgical site, initial encounter: Secondary | ICD-10-CM

## 2020-05-30 DIAGNOSIS — T8141XA Infection following a procedure, superficial incisional surgical site, initial encounter: Principal | ICD-10-CM | POA: Diagnosis present

## 2020-05-30 DIAGNOSIS — G4733 Obstructive sleep apnea (adult) (pediatric): Secondary | ICD-10-CM | POA: Diagnosis present

## 2020-05-30 DIAGNOSIS — T8189XA Other complications of procedures, not elsewhere classified, initial encounter: Secondary | ICD-10-CM | POA: Diagnosis not present

## 2020-05-30 LAB — COMPREHENSIVE METABOLIC PANEL
ALT: 47 U/L — ABNORMAL HIGH (ref 0–44)
AST: 54 U/L — ABNORMAL HIGH (ref 15–41)
Albumin: 2.3 g/dL — ABNORMAL LOW (ref 3.5–5.0)
Alkaline Phosphatase: 90 U/L (ref 38–126)
Anion gap: 12 (ref 5–15)
BUN: 59 mg/dL — ABNORMAL HIGH (ref 8–23)
CO2: 21 mmol/L — ABNORMAL LOW (ref 22–32)
Calcium: 8.1 mg/dL — ABNORMAL LOW (ref 8.9–10.3)
Chloride: 101 mmol/L (ref 98–111)
Creatinine, Ser: 3.09 mg/dL — ABNORMAL HIGH (ref 0.61–1.24)
GFR, Estimated: 20 mL/min — ABNORMAL LOW (ref >60)
Glucose, Bld: 85 mg/dL (ref 70–99)
Potassium: 3.5 mmol/L (ref 3.5–5.1)
Sodium: 134 mmol/L — ABNORMAL LOW (ref 135–145)
Total Bilirubin: 0.5 mg/dL (ref 0.3–1.2)
Total Protein: 6.3 g/dL — ABNORMAL LOW (ref 6.5–8.1)

## 2020-05-30 LAB — CBC WITH DIFFERENTIAL/PLATELET
Abs Immature Granulocytes: 0.08 10*3/uL — ABNORMAL HIGH (ref 0.00–0.07)
Basophils Absolute: 0 10*3/uL (ref 0.0–0.1)
Basophils Relative: 0 %
Eosinophils Absolute: 0 10*3/uL (ref 0.0–0.5)
Eosinophils Relative: 0 %
HCT: 31.6 % — ABNORMAL LOW (ref 39.0–52.0)
Hemoglobin: 10.2 g/dL — ABNORMAL LOW (ref 13.0–17.0)
Immature Granulocytes: 1 %
Lymphocytes Relative: 22 %
Lymphs Abs: 1.8 10*3/uL (ref 0.7–4.0)
MCH: 29.3 pg (ref 26.0–34.0)
MCHC: 32.3 g/dL (ref 30.0–36.0)
MCV: 90.8 fL (ref 80.0–100.0)
Monocytes Absolute: 1 10*3/uL (ref 0.1–1.0)
Monocytes Relative: 12 %
Neutro Abs: 5.5 10*3/uL (ref 1.7–7.7)
Neutrophils Relative %: 65 %
Platelets: 274 10*3/uL (ref 150–400)
RBC: 3.48 MIL/uL — ABNORMAL LOW (ref 4.22–5.81)
RDW: 14 % (ref 11.5–15.5)
WBC: 8.5 10*3/uL (ref 4.0–10.5)
nRBC: 0 % (ref 0.0–0.2)

## 2020-05-30 LAB — URINALYSIS, ROUTINE W REFLEX MICROSCOPIC
Bilirubin Urine: NEGATIVE
Glucose, UA: NEGATIVE mg/dL
Ketones, ur: NEGATIVE mg/dL
Leukocytes,Ua: NEGATIVE
Nitrite: NEGATIVE
Protein, ur: NEGATIVE mg/dL
Specific Gravity, Urine: 1.014 (ref 1.005–1.030)
pH: 5 (ref 5.0–8.0)

## 2020-05-30 LAB — CBG MONITORING, ED
Glucose-Capillary: 72 mg/dL (ref 70–99)
Glucose-Capillary: 72 mg/dL (ref 70–99)

## 2020-05-30 LAB — C-REACTIVE PROTEIN: CRP: 3.4 mg/dL — ABNORMAL HIGH (ref <1.0)

## 2020-05-30 LAB — PROTIME-INR
INR: 1.2 (ref 0.8–1.2)
Prothrombin Time: 15.5 seconds — ABNORMAL HIGH (ref 11.4–15.2)

## 2020-05-30 LAB — C DIFFICILE QUICK SCREEN W PCR REFLEX
C Diff antigen: NEGATIVE
C Diff interpretation: NOT DETECTED
C Diff toxin: NEGATIVE

## 2020-05-30 LAB — RESP PANEL BY RT-PCR (FLU A&B, COVID) ARPGX2
Influenza A by PCR: NEGATIVE
Influenza B by PCR: NEGATIVE
SARS Coronavirus 2 by RT PCR: NEGATIVE

## 2020-05-30 LAB — LACTIC ACID, PLASMA: Lactic Acid, Venous: 1.3 mmol/L (ref 0.5–1.9)

## 2020-05-30 LAB — GLUCOSE, CAPILLARY
Glucose-Capillary: 131 mg/dL — ABNORMAL HIGH (ref 70–99)
Glucose-Capillary: 132 mg/dL — ABNORMAL HIGH (ref 70–99)
Glucose-Capillary: 149 mg/dL — ABNORMAL HIGH (ref 70–99)
Glucose-Capillary: 159 mg/dL — ABNORMAL HIGH (ref 70–99)
Glucose-Capillary: 39 mg/dL — CL (ref 70–99)
Glucose-Capillary: 43 mg/dL — CL (ref 70–99)

## 2020-05-30 LAB — SEDIMENTATION RATE: Sed Rate: 72 mm/hr — ABNORMAL HIGH (ref 0–16)

## 2020-05-30 MED ORDER — ATORVASTATIN CALCIUM 10 MG PO TABS
20.0000 mg | ORAL_TABLET | Freq: Every day | ORAL | Status: DC
  Administered 2020-05-30 – 2020-06-08 (×10): 20 mg via ORAL
  Filled 2020-05-30 (×10): qty 2

## 2020-05-30 MED ORDER — LACTATED RINGERS IV SOLN: INTRAVENOUS | Status: DC

## 2020-05-30 MED ORDER — FLEET ENEMA 7-19 GM/118ML RE ENEM: 1.0000 | ENEMA | Freq: Once | RECTAL | Status: DC | PRN

## 2020-05-30 MED ORDER — CYCLOBENZAPRINE HCL 10 MG PO TABS
10.0000 mg | ORAL_TABLET | Freq: Three times a day (TID) | ORAL | Status: DC | PRN
  Administered 2020-05-31 – 2020-06-07 (×3): 10 mg via ORAL
  Filled 2020-05-30 (×3): qty 1

## 2020-05-30 MED ORDER — ONDANSETRON HCL 4 MG/2ML IJ SOLN
4.0000 mg | Freq: Once | INTRAMUSCULAR | Status: AC
  Administered 2020-05-30: 4 mg via INTRAVENOUS
  Filled 2020-05-30: qty 2

## 2020-05-30 MED ORDER — METOPROLOL TARTRATE 5 MG/5ML IV SOLN
5.0000 mg | Freq: Four times a day (QID) | INTRAVENOUS | Status: DC | PRN
  Administered 2020-06-08: 5 mg via INTRAVENOUS
  Filled 2020-05-30: qty 5

## 2020-05-30 MED ORDER — ACETAMINOPHEN 650 MG RE SUPP: 650.0000 mg | Freq: Four times a day (QID) | RECTAL | Status: DC | PRN

## 2020-05-30 MED ORDER — GLIPIZIDE ER 10 MG PO TB24
10.0000 mg | ORAL_TABLET | Freq: Two times a day (BID) | ORAL | Status: DC
  Administered 2020-05-31 – 2020-06-03 (×7): 10 mg via ORAL
  Filled 2020-05-30 (×9): qty 1

## 2020-05-30 MED ORDER — DEXTROSE 50 % IV SOLN
1.0000 | Freq: Once | INTRAVENOUS | Status: AC
  Administered 2020-05-30: 50 mL via INTRAVENOUS

## 2020-05-30 MED ORDER — CARVEDILOL 25 MG PO TABS
25.0000 mg | ORAL_TABLET | Freq: Two times a day (BID) | ORAL | Status: DC
  Administered 2020-05-31 – 2020-06-09 (×18): 25 mg via ORAL
  Filled 2020-05-30 (×17): qty 1

## 2020-05-30 MED ORDER — LISINOPRIL 20 MG PO TABS
40.0000 mg | ORAL_TABLET | Freq: Every day | ORAL | Status: DC
  Administered 2020-05-31 – 2020-06-09 (×10): 40 mg via ORAL
  Filled 2020-05-30 (×10): qty 2

## 2020-05-30 MED ORDER — ONDANSETRON HCL 4 MG/2ML IJ SOLN
4.0000 mg | Freq: Four times a day (QID) | INTRAMUSCULAR | Status: DC | PRN
  Administered 2020-05-30 – 2020-06-09 (×11): 4 mg via INTRAVENOUS
  Filled 2020-05-30 (×12): qty 2

## 2020-05-30 MED ORDER — AMLODIPINE BESYLATE 5 MG PO TABS
5.0000 mg | ORAL_TABLET | Freq: Every day | ORAL | Status: DC
  Administered 2020-05-31 – 2020-06-09 (×10): 5 mg via ORAL
  Filled 2020-05-30 (×10): qty 1

## 2020-05-30 MED ORDER — SODIUM CHLORIDE 0.9 % IV BOLUS
1000.0000 mL | Freq: Once | INTRAVENOUS | Status: AC
  Administered 2020-05-30: 1000 mL via INTRAVENOUS

## 2020-05-30 MED ORDER — ONDANSETRON HCL 4 MG PO TABS
4.0000 mg | ORAL_TABLET | Freq: Four times a day (QID) | ORAL | Status: DC | PRN
  Administered 2020-06-01 – 2020-06-03 (×2): 4 mg via ORAL
  Filled 2020-05-30 (×2): qty 1

## 2020-05-30 MED ORDER — KCL IN DEXTROSE-NACL 20-5-0.9 MEQ/L-%-% IV SOLN
INTRAVENOUS | Status: DC
  Filled 2020-05-30 (×16): qty 1000

## 2020-05-30 MED ORDER — HYDROMORPHONE HCL 1 MG/ML IJ SOLN: 0.5000 mg | INTRAMUSCULAR | Status: DC | PRN

## 2020-05-30 MED ORDER — BISACODYL 10 MG RE SUPP: 10.0000 mg | Freq: Every day | RECTAL | Status: DC | PRN

## 2020-05-30 MED ORDER — INSULIN GLARGINE 100 UNIT/ML ~~LOC~~ SOLN
40.0000 [IU] | Freq: Every day | SUBCUTANEOUS | Status: DC
  Administered 2020-05-31 – 2020-06-03 (×4): 40 [IU] via SUBCUTANEOUS
  Filled 2020-05-30 (×5): qty 0.4

## 2020-05-30 MED ORDER — OXYCODONE HCL 5 MG PO TABS
5.0000 mg | ORAL_TABLET | ORAL | Status: DC | PRN
  Administered 2020-05-31: 5 mg via ORAL
  Filled 2020-05-30: qty 1

## 2020-05-30 MED ORDER — SERTRALINE HCL 100 MG PO TABS
100.0000 mg | ORAL_TABLET | Freq: Every day | ORAL | Status: DC
  Administered 2020-05-31 – 2020-06-09 (×10): 100 mg via ORAL
  Filled 2020-05-30 (×10): qty 1

## 2020-05-30 MED ORDER — ZOLPIDEM TARTRATE 5 MG PO TABS: 5.0000 mg | ORAL_TABLET | Freq: Every evening | ORAL | Status: DC | PRN

## 2020-05-30 MED ORDER — ACETAMINOPHEN 325 MG PO TABS
650.0000 mg | ORAL_TABLET | Freq: Four times a day (QID) | ORAL | Status: DC | PRN
  Administered 2020-06-01 – 2020-06-03 (×2): 650 mg via ORAL
  Filled 2020-05-30 (×2): qty 2

## 2020-05-30 MED ORDER — HYDROCHLOROTHIAZIDE 12.5 MG PO CAPS
12.5000 mg | ORAL_CAPSULE | Freq: Two times a day (BID) | ORAL | Status: DC
  Administered 2020-05-30 – 2020-06-09 (×20): 12.5 mg via ORAL
  Filled 2020-05-30 (×20): qty 1

## 2020-05-30 MED ORDER — POLYETHYLENE GLYCOL 3350 17 G PO PACK: 17.0000 g | PACK | Freq: Every day | ORAL | Status: DC | PRN

## 2020-05-30 MED ORDER — DOCUSATE SODIUM 100 MG PO CAPS
100.0000 mg | ORAL_CAPSULE | Freq: Two times a day (BID) | ORAL | Status: DC
  Administered 2020-06-01 – 2020-06-09 (×10): 100 mg via ORAL
  Filled 2020-05-30 (×16): qty 1

## 2020-05-30 NOTE — ED Provider Notes (Signed)
[facility] EMERGENCY DEPARTMENT Provider Note   CSN: [id number] Arrival date & time: 05/30/20  0818     History Chief Complaint  Patient presents with  . Wound Infection    Jeffery Fernandez is a 75 y.o. male.  75 yo M with a chief complaints of drainage from a surgical wound.  The patient had a neurosurgical procedure done on the 14th of the last month.  He has been doing well as far as his back pain goes but noticed some drainage from his surgical wound about a week ago and saw his neurosurgeon in the office.  Was started on doxycycline.  Since then the patient has had some weakness and nausea.  Having some trouble getting up and moving around.  He denies any numbness or weakness to his legs denies any back pain denies any urinary symptoms.  Denies cough congestion or fever.  Has an appointment with his neurosurgeon at 58 today.  The history is provided by the patient.  Illness Severity:  Moderate Onset quality:  Gradual Duration:  2 days Timing:  Constant Progression:  Worsening Chronicity:  New Associated symptoms: fatigue   Associated symptoms: no abdominal pain, no chest pain, no congestion, no diarrhea, no fever, no headaches, no myalgias, no rash, no shortness of breath and no vomiting        Past Medical History:  Diagnosis Date  . Arrhythmia   . Arthritis   . Cataract    bilateral cataract extraction with intraoccular lens implants  . Chest pain, unspecified   . Chronic airway obstruction, not elsewhere classified    PT. DENIES HE HAS COPD  . CKD (chronic kidney disease)   . Colon polyps    Adenomatous Polyps 2007  . Depression   . Diabetes mellitus   . Diverticulosis   . Gout   . Heart murmur   . History of kidney stones   . Hyperlipemia   . Hypertension   . Nephrolithiasis   . Normocytic anemia 04/23/2017  . Obstructive sleep apnea (adult) (pediatric)   . Other and unspecified hyperlipidemia   . Other left bundle branch block   .  Retinal vascular occlusion, unspecified    right  . Shortness of breath    on exertion  . Unspecified sleep apnea   . Vision loss of right eye   . White coat syndrome with diagnosis of hypertension 09/19/2017    Patient Active Problem List   Diagnosis Date Noted  . 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 Lumbar compression fracture ([place]) 01/19/2018  . White coat syndrome with diagnosis of hypertension 09/19/2017  . Normocytic anemia 04/23/2017  . Heme positive stool 04/23/2017  . Cervical spondylosis with myelopathy and radiculopathy 06/26/2016  . Spinal stenosis of cervical region 06/26/2016  . Personal history of colonic polyps 06/14/2011  . CAD (coronary artery disease) 07/10/2010  . OSA (obstructive sleep apnea) 06/10/2010  . Hypertension 06/10/2010  . COPD (chronic obstructive pulmonary disease) ([place]) 06/10/2010  . Hyperlipidemia 06/10/2010  . Retinal artery occlusion 06/10/2010    Past Surgical History:  Procedure Laterality Date  . ANTERIOR CERVICAL DECOMP/DISCECTOMY FUSION N/A 06/26/2016   Procedure: Re-Exploration of Cervical Wound;  Surgeon: Kary Kos, MD;  Location: [facility];  Service: Neurosurgery;  Laterality: N/A;  . ANTERIOR CERVICAL DECOMP/DISCECTOMY FUSION N/A 06/26/2016   Procedure: ANTERIOR CERVICAL DECOMPRESSION/DISCECTOMY FUSION, INTERBODY PROSTESIS, PLATE, CERVICAL THREE CERVICAL  FOUR, CERVICAL FOUR CERVICAL FIVE CERVICAL SIX;  Surgeon: Newman Pies, MD;  Location: [facility];  Service: Neurosurgery;  Laterality: N/A;  . BACK SURGERY    . CATARACT EXTRACTION Bilateral   . KIDNEY STONE SURGERY    . KYPHOPLASTY N/A 01/19/2018   Procedure: KYPHOPLASTY LUMBAR THREE;  Surgeon: Newman Pies, MD;  Location: [facility];  Service: Neurosurgery;  Laterality: N/A;  KYPHOPLASTY LUMBAR THREE       Family History  Problem Relation Age of Onset  . Heart attack Brother 60        heart attack and CHF/ also some form of EP ablation  . Parkinson's disease Brother   . Atrial fibrillation Brother   . Heart disease Father   . Stroke Father   . Uterine cancer Mother   . Arthritis/Rheumatoid Cousin   . Diabetes Other   . Colon cancer Neg Hx   . Stomach cancer Neg Hx   . Liver cancer Neg Hx   . Rectal cancer Neg Hx   . Esophageal cancer Neg Hx     Social History   Tobacco Use  . Smoking status: Former Smoker    Packs/day: 1.50    Years: 25.00    Pack years: 37.50    Types: Cigarettes    Quit date: 01/28/1986    Years since quitting: 34.3  . Smokeless tobacco: Never Used  Vaping Use  . Vaping Use: Never used  Substance Use Topics  . Alcohol use: Not Currently    Comment: Very rare  . Drug use: No    Home Medications Prior to Admission medications   Medication Sig Start Date End Date Taking? Authorizing Provider  acetaminophen (TYLENOL) 500 MG tablet Take 1,000 mg by mouth every 6 (six) hours as needed (for pain.).    [provider]  amLODipine (NORVASC) 5 MG tablet Take 5 mg by mouth daily.    [provider]  aspirin 325 MG EC tablet Take 325 mg by mouth daily.    [provider]  atorvastatin (LIPITOR) 40 MG tablet Take 20 mg by mouth at bedtime.     [provider]  carvedilol (COREG) 25 MG tablet TAKE 1 TABLET BY MOUTH 2 TIMES DAILY WITH A MEAL. Patient taking differently: Take 25 mg by mouth 2 (two) times daily with a meal. 12/02/18   Skeet Latch, MD  cyclobenzaprine (FLEXERIL) 10 MG tablet Take 1 tablet (10 mg total) by mouth 3 (three) times daily as needed for muscle spasms. 05/19/20   Viona Gilmore D, NP  docusate sodium (COLACE) 100 MG capsule Take 1 capsule (100 mg total) by mouth 2 (two) times daily. 05/19/20   Viona Gilmore D, NP  fluticasone (FLONASE) 50 MCG/ACT nasal spray Place 1 spray into both nostrils daily. Patient not taking: Reported on 05/11/2020 09/19/17   Skeet Latch, MD  glipiZIDE  (GLUCOTROL XL) 10 MG 24 hr tablet Take 10 mg by mouth 2 (two) times daily.    [provider]  hydrochlorothiazide (MICROZIDE) 12.5 MG capsule TAKE 1 CAPSULE BY MOUTH EVERY DAY Patient taking differently: Take 12.5 mg by mouth 2 (two) times daily. 12/14/18   Skeet Latch, MD  insulin glargine (LANTUS) 100 UNIT/ML injection Inject 40 Units into the skin daily.    [provider]  lisinopril (ZESTRIL) 40 MG tablet TAKE 1 TABLET BY MOUTH EVERY DAY Patient taking differently: Take 40 mg by mouth daily. 10/01/19   Skeet Latch, MD  oxyCODONE (OXY IR/ROXICODONE) 5 MG immediate release  tablet Take 1 tablet (5 mg total) by mouth every 4 (four) hours as needed for moderate pain ((score 4 to 6)). 05/19/20   Viona Gilmore D, NP  sertraline (ZOLOFT) 100 MG tablet Take 100 mg by mouth daily.    [provider]  terazosin (HYTRIN) 5 MG capsule TAKE 1 CAPSULE BY MOUTH EVERYDAY AT BEDTIME Patient taking differently: Take 5 mg by mouth at bedtime. 06/26/18   Skeet Latch, MD  TURMERIC PO Take 1 tablet by mouth daily.    [provider]    Allergies    Penicillins  Review of Systems   Review of Systems  Constitutional: Positive for fatigue. Negative for chills and fever.  HENT: Negative for congestion and facial swelling.   Eyes: Negative for discharge and visual disturbance.  Respiratory: Negative for shortness of breath.   Cardiovascular: Negative for chest pain and palpitations.  Gastrointestinal: Negative for abdominal pain, diarrhea and vomiting.  Musculoskeletal: Negative for arthralgias and myalgias.  Skin: Positive for wound. Negative for color change and rash.  Neurological: Negative for tremors, syncope and headaches.  Psychiatric/Behavioral: Negative for confusion and dysphoric mood.    Physical Exam Updated Vital Signs BP (!) 97/58 (BP Location: Right Arm)   Pulse 71   Temp 97.6 F (36.4 C) (Oral)   Resp 19   Ht 5' 9.5" (1.765 m)   Wt  91.2 kg   SpO2 97%   BMI 29.26 kg/m   Physical Exam Vitals and nursing note reviewed.  Constitutional:      Appearance: He is well-developed.  HENT:     Head: Normocephalic and atraumatic.  Eyes:     Pupils: Pupils are equal, round, and reactive to light.  Neck:     Vascular: No JVD.  Cardiovascular:     Rate and Rhythm: Normal rate and regular rhythm.     Heart sounds: No murmur heard. No friction rub. No gallop.   Pulmonary:     Effort: No respiratory distress.     Breath sounds: No wheezing.  Abdominal:     General: There is no distension.     Tenderness: There is no abdominal tenderness. There is no guarding or rebound.  Musculoskeletal:        General: Normal range of motion.     Cervical back: Normal range of motion and neck supple.     Comments: Midline lumbar wound, dehisced.  No obvious drainage on exam.  Active drainage noted in the dressing.  Nontender no obvious fluctuance or induration.  Skin:    Coloration: Skin is not pale.     Findings: No rash.  Neurological:     Mental Status: He is alert and oriented to person, place, and time.  Psychiatric:        Behavior: Behavior normal.     ED Results / Procedures / Treatments   Labs (all labs ordered are listed, but only abnormal results are displayed) Labs Reviewed  CULTURE, BLOOD (ROUTINE X 2)  CULTURE, BLOOD (ROUTINE X 2)  COMPREHENSIVE METABOLIC PANEL  LACTIC ACID, PLASMA  LACTIC ACID, PLASMA  CBC WITH DIFFERENTIAL/PLATELET  PROTIME-INR  URINALYSIS, ROUTINE W REFLEX MICROSCOPIC  CBG MONITORING, ED    EKG None  Radiology No results found.  Procedures Procedures   Medications Ordered in ED Medications  ondansetron (ZOFRAN) injection 4 mg (has no administration in time range)    ED Course  I have reviewed the triage vital signs and the nursing notes.  Pertinent labs & imaging results  that were available during my care of the patient were reviewed by me and considered in my medical  decision making (see chart for details).    MDM Rules/Calculators/A&P                          75 yo M with a chief complaints of fatigue nausea and drainage from surgical wound.  Patient had seen his neurosurgeon about a week ago and was started on antibiotics.  Has had some worsening failure to thrive since then.  Has an appointment with his neurosurgeon this afternoon in the office but felt like he was not doing well at home and so came for evaluation here.  I discussed the case with Dr. Arnoldo Morale, neurosurgery plan for them to evaluate at bedside for likely admission.  The patients results and plan were reviewed and discussed.   Any x-rays performed were independently reviewed by myself.   Differential diagnosis were considered with the presenting HPI.  Medications  amLODipine (NORVASC) tablet 5 mg (5 mg Oral Not Given 05/30/20 1227)  atorvastatin (LIPITOR) tablet 20 mg (has no administration in time range)  carvedilol (COREG) tablet 25 mg (has no administration in time range)  hydrochlorothiazide (MICROZIDE) capsule 12.5 mg (12.5 mg Oral Not Given 05/30/20 1228)  lisinopril (ZESTRIL) tablet 40 mg (40 mg Oral Not Given 05/30/20 1228)  sertraline (ZOLOFT) tablet 100 mg (has no administration in time range)  glipiZIDE (GLUCOTROL XL) 24 hr tablet 10 mg (has no administration in time range)  insulin glargine (LANTUS) injection 40 Units (has no administration in time range)  cyclobenzaprine (FLEXERIL) tablet 10 mg (has no administration in time range)  acetaminophen (TYLENOL) tablet 650 mg (has no administration in time range)    Or  acetaminophen (TYLENOL) suppository 650 mg (has no administration in time range)  oxyCODONE (Oxy IR/ROXICODONE) immediate release tablet 5 mg (has no administration in time range)  HYDROmorphone (DILAUDID) injection 0.5-1 mg (has no administration in time range)  zolpidem (AMBIEN) tablet 5 mg (has no administration in time range)  docusate sodium (COLACE) capsule 100  mg (has no administration in time range)  polyethylene glycol (MIRALAX / GLYCOLAX) packet 17 g (has no administration in time range)  bisacodyl (DULCOLAX) suppository 10 mg (has no administration in time range)  sodium phosphate (FLEET) 7-19 GM/118ML enema 1 enema (has no administration in time range)  ondansetron (ZOFRAN) tablet 4 mg (has no administration in time range)    Or  ondansetron (ZOFRAN) injection 4 mg (has no administration in time range)  metoprolol tartrate (LOPRESSOR) injection 5 mg (has no administration in time range)  ondansetron (ZOFRAN) injection 4 mg (4 mg Intravenous Given 05/30/20 0902)  sodium chloride 0.9 % bolus 1,000 mL (0 mLs Intravenous Stopped 05/30/20 1104)    Vitals:   05/30/20 1100 05/30/20 1145 05/30/20 1200 05/30/20 1215  BP: (!) 107/57 (!) 95/53 110/68 93/60  Pulse: 74 77 77 81  Resp: 16 20 (!) 23 17  Temp:      TempSrc:      SpO2: 98% 93% 93% 92%  Weight:      Height:        Final diagnoses:  Wound infection after surgery  AKI (acute kidney injury) ([place])    Admission/ observation were discussed with the admitting physician, patient and/or family and they are comfortable with the plan.   Final Clinical Impression(s) / ED Diagnoses Final diagnoses:  None    Rx / DC Orders  ED Discharge Orders    None       Deno Etienne, [place] 05/30/20 1437

## 2020-05-30 NOTE — Progress Notes (Signed)
Patient came to my floor around 4 pm on stretcher from ER. He is having diarrhea. I collected stool but I have not been able to collect urine because he urinated in bedpan with diarrhea and also got on bed. His incision is draining so I removed gauze and tape and put on a honeycomb dressing. His blood sugar was a 43 so we gave him 1 ampule of D50. Then 30 minutes later his blood sugar was 159. I called the dr on call and he verbally ordered D5NS and KCl 18meq 110 mL/hour. Insulin may have to be held tonight again.

## 2020-05-30 NOTE — ED Notes (Signed)
ED Provider at bedside. 

## 2020-05-30 NOTE — ED Notes (Signed)
X RAY at bedside 

## 2020-05-30 NOTE — ED Triage Notes (Signed)
Per ems, pt from home. Pt had back surgery x2 weeks, family started noticing surgical wound infection with drainage on 4/25. Pt started on Doxycycline, currently taking. Pt has had intermittent confusion, weakness, and n/v/d x24 hours. Upon ems arrival, pt CBG 63, ems gave D10 and CBG to 93. CBG 72 in triage. EMS initial BP 80/52, gave 1191ml of fluids en route, BP up to 115/64. Pt currently A&Ox4. Pt denies back pain at this time.

## 2020-05-30 NOTE — H&P (Signed)
Jeffery Fernandez is an 75 y.o. male.   Chief Complaint: Generalized weakness HPI: Jeffery Fernandez is a 75 y.o. male with a past medical history significant for uncontrolled diabetes, CKD, hypertension, and left BBB.  Jeffery Fernandez  Had an L3 kyphoplasty in December of 2019.  He suffered a fall in March of this year and was treated at a [facility] for a sprained ankle and pneumonia. Following discharge from the hospital, he was seen in the [place] office for back pain. Imaging revealed an L4 compression fracture with stenosis. He underwent an L4-5 decompression, instrumentation and fusion on 05/11/2020 with Dr. Arnoldo Morale.  Jeffery Fernandez was slow to mobilize and was transferred to a skilled nursing facility on 05/19/2020.  He and his daughter were unsatisfied with the care there and he was discharged on 05/23/2020. The patient noted some drainage from his wound when he was discharged home. He was advised to change his dressing b.i.d. and p.r.n. He was also started on doxycycline on 05/25/2020. He has not had a dose per his wife since 05/28/2020. At present, he denies any significant back pain. He has been having trouble mobilizing, but this has been an issue and was one of the reasons continued therapies were recommended at discharge following surgery. He denies fever. He has had a decreased appetite, nausea, diarrhea, generalized weakness, and serosanguinous drainage from his surgical wound.   Past Medical History:  Diagnosis Date  . Arrhythmia   . Arthritis   . Cataract    bilateral cataract extraction with intraoccular lens implants  . Chest pain, unspecified   . Chronic airway obstruction, not elsewhere classified    PT. DENIES HE HAS COPD  . CKD (chronic kidney disease)   . Colon polyps    Adenomatous Polyps 2007  . Depression   . Diabetes mellitus   . Diverticulosis   . Gout   . Heart murmur   . History of kidney stones   . Hyperlipemia   . Hypertension   . Nephrolithiasis   . Normocytic anemia 04/23/2017   . Obstructive sleep apnea (adult) (pediatric)   . Other and unspecified hyperlipidemia   . Other left bundle branch block   . Retinal vascular occlusion, unspecified    right  . Shortness of breath    on exertion  . Unspecified sleep apnea   . Vision loss of right eye   . White coat syndrome with diagnosis of hypertension 09/19/2017    Past Surgical History:  Procedure Laterality Date  . ANTERIOR CERVICAL DECOMP/DISCECTOMY FUSION N/A 06/26/2016   Procedure: Re-Exploration of Cervical Wound;  Surgeon: Kary Kos, MD;  Location: [facility];  Service: Neurosurgery;  Laterality: N/A;  . ANTERIOR CERVICAL DECOMP/DISCECTOMY FUSION N/A 06/26/2016   Procedure: ANTERIOR CERVICAL DECOMPRESSION/DISCECTOMY FUSION, INTERBODY PROSTESIS, PLATE, CERVICAL THREE CERVICAL FOUR, CERVICAL FOUR CERVICAL FIVE CERVICAL SIX;  Surgeon: Newman Pies, MD;  Location: [facility];  Service: Neurosurgery;  Laterality: N/A;  . BACK SURGERY    . CATARACT EXTRACTION Bilateral   . KIDNEY STONE SURGERY    . KYPHOPLASTY N/A 01/19/2018   Procedure: KYPHOPLASTY LUMBAR THREE;  Surgeon: Newman Pies, MD;  Location: [facility];  Service: Neurosurgery;  Laterality: N/A;  KYPHOPLASTY LUMBAR THREE    Family History  Problem Relation Age of Onset  . Heart attack Brother 60       heart attack and CHF/ also some form of EP ablation  . Parkinson's disease Brother   . Atrial fibrillation Brother   . Heart disease  Father   . Stroke Father   . Uterine cancer Mother   . Arthritis/Rheumatoid Cousin   . Diabetes Other   . Colon cancer Neg Hx   . Stomach cancer Neg Hx   . Liver cancer Neg Hx   . Rectal cancer Neg Hx   . Esophageal cancer Neg Hx    Social History:  reports that he quit smoking about 34 years ago. His smoking use included cigarettes. He has a 37.50 pack-year smoking history. He has never used smokeless tobacco. He reports previous alcohol use. He reports that he does not use drugs.  Allergies:  Allergies  Allergen  Reactions  . Penicillins Other (See Comments)    Possibly rash - not sure Has patient had a PCN reaction causing immediate rash, facial/tongue/throat swelling, SOB or lightheadedness with hypotension:unknown Has patient had a PCN reaction causing severe rash involving mucus membranes or skin necrosis: Unknown Has patient had a PCN reaction that required hospitalization: Unknown Has patient had a PCN reaction occurring within the last 10 years: No Childhood reaction If all of the above answers are "NO", then may proceed with Cephalosporin use.     (Not in a hospital admission)   Results for orders placed or performed during the hospital encounter of 05/30/20 (from the past 48 hour(s))  CBG monitoring, ED     Status: None   Collection Time: 05/30/20  8:20 AM  Result Value Ref Range   Glucose-Capillary 72 70 - 99 mg/dL    Comment: Glucose reference range applies only to samples taken after fasting for at least 8 hours.  Comprehensive metabolic panel     Status: Abnormal   Collection Time: 05/30/20  8:37 AM  Result Value Ref Range   Sodium 134 (L) 135 - 145 mmol/L   Potassium 3.5 3.5 - 5.1 mmol/L   Chloride 101 98 - 111 mmol/L   CO2 21 (L) 22 - 32 mmol/L   Glucose, Bld 85 70 - 99 mg/dL    Comment: Glucose reference range applies only to samples taken after fasting for at least 8 hours.   BUN 59 (H) 8 - 23 mg/dL   Creatinine, Ser 3.09 (H) 0.61 - 1.24 mg/dL   Calcium 8.1 (L) 8.9 - 10.3 mg/dL   Total Protein 6.3 (L) 6.5 - 8.1 g/dL   Albumin 2.3 (L) 3.5 - 5.0 g/dL   AST 54 (H) 15 - 41 U/L   ALT 47 (H) 0 - 44 U/L   Alkaline Phosphatase 90 38 - 126 U/L   Total Bilirubin 0.5 0.3 - 1.2 mg/dL   GFR, Estimated 20 (L) >60 mL/min    Comment: (NOTE) Calculated using the CKD-EPI Creatinine Equation (2021)    Anion gap 12 5 - 15    Comment: Performed at [facility], [place] [address], [place], [place] [address]  Lactic acid, plasma     Status: None   Collection Time: 05/30/20  8:37 AM   Result Value Ref Range   Lactic Acid, Venous 1.3 0.5 - 1.9 mmol/L    Comment: Performed at [facility] [address] [place], [place] [address]  CBC with Differential     Status: Abnormal   Collection Time: 05/30/20  8:37 AM  Result Value Ref Range   WBC 8.5 4.0 - 10.5 K/uL   RBC 3.48 (L) 4.22 - 5.81 MIL/uL   Hemoglobin 10.2 (L) 13.0 - 17.0 g/dL   HCT 31.6 (L) 39.0 - 52.0 %   MCV  90.8 80.0 - 100.0 fL   MCH 29.3 26.0 - 34.0 pg   MCHC 32.3 30.0 - 36.0 g/dL   RDW 14.0 11.5 - 15.5 %   Platelets 274 150 - 400 K/uL   nRBC 0.0 0.0 - 0.2 %   Neutrophils Relative % 65 %   Neutro Abs 5.5 1.7 - 7.7 K/uL   Lymphocytes Relative 22 %   Lymphs Abs 1.8 0.7 - 4.0 K/uL   Monocytes Relative 12 %   Monocytes Absolute 1.0 0.1 - 1.0 K/uL   Eosinophils Relative 0 %   Eosinophils Absolute 0.0 0.0 - 0.5 K/uL   Basophils Relative 0 %   Basophils Absolute 0.0 0.0 - 0.1 K/uL   Immature Granulocytes 1 %   Abs Immature Granulocytes 0.08 (H) 0.00 - 0.07 K/uL    Comment: Performed at [facility] [address], [place], [place] [address]  Protime-INR     Status: Abnormal   Collection Time: 05/30/20  8:37 AM  Result Value Ref Range   Prothrombin Time 15.5 (H) 11.4 - 15.2 seconds   INR 1.2 0.8 - 1.2    Comment: (NOTE) INR goal varies based on device and disease states. Performed at [facility], [place] [address] [place], [place] [address]   Resp Panel by RT-PCR (Flu A&B, Covid) Nasopharyngeal Swab     Status: None   Collection Time: 05/30/20  9:34 AM   Specimen: Nasopharyngeal Swab; Nasopharyngeal(NP) swabs in vial transport medium  Result Value Ref Range   SARS Coronavirus 2 by RT PCR NEGATIVE NEGATIVE    Comment: (NOTE) SARS-CoV-2 target nucleic acids are NOT DETECTED.  The SARS-CoV-2 RNA is generally detectable in upper respiratory specimens during the acute phase of infection. The lowest concentration of SARS-CoV-2 viral copies this assay can detect is 138 copies/mL. A  negative result does not preclude SARS-Cov-2 infection and should not be used as the sole basis for treatment or other patient management decisions. A negative result may occur with  improper specimen collection/handling, submission of specimen other than nasopharyngeal swab, presence of viral mutation(s) within the areas targeted by this assay, and inadequate number of viral copies(<138 copies/mL). A negative result must be combined with clinical observations, patient history, and epidemiological information. The expected result is Negative.  Fact Sheet for Patients:  EntrepreneurPulse.com.au  Fact Sheet for Healthcare Providers:  IncredibleEmployment.be  This test is no t yet approved or cleared by the [place] FDA and  has been authorized for detection and/or diagnosis of SARS-CoV-2 by FDA under an Emergency Use Authorization (EUA). This EUA will remain  in effect (meaning this test can be used) for the duration of the COVID-19 declaration under Section 564(b)(1) of the Act, 21 U.S.C.section 360bbb-3(b)(1), unless the authorization is terminated  or revoked sooner.       Influenza A by PCR NEGATIVE NEGATIVE   Influenza B by PCR NEGATIVE NEGATIVE    Comment: (NOTE) The Xpert Xpress SARS-CoV-2/FLU/RSV plus assay is intended as an aid in the diagnosis of influenza from Nasopharyngeal swab specimens and should not be used as a sole basis for treatment. Nasal washings and aspirates are unacceptable for Xpert Xpress SARS-CoV-2/FLU/RSV testing.  Fact Sheet for Patients: EntrepreneurPulse.com.au  Fact Sheet for Healthcare Providers: IncredibleEmployment.be  This test is not yet approved or cleared by the [place] FDA and has been authorized for detection and/or diagnosis of SARS-CoV-2 by FDA under an Emergency Use Authorization (EUA). This EUA will remain in effect (meaning this  test can be used)  for the duration of the COVID-19 declaration under Section 564(b)(1) of the Act, 21 U.S.C. section 360bbb-3(b)(1), unless the authorization is terminated or revoked.  Performed at [facility], [place] [address], [place], [place] [address]   CBG monitoring, ED     Status: None   Collection Time: 05/30/20 10:18 AM  Result Value Ref Range   Glucose-Capillary 72 70 - 99 mg/dL    Comment: Glucose reference range applies only to samples taken after fasting for at least 8 hours.   DG Chest Port 1 View  Result Date: 05/30/2020 CLINICAL DATA:  Fatigue. EXAM: PORTABLE CHEST 1 VIEW COMPARISON:  11/12/2014. FINDINGS: Mediastinum and hilar structures normal. Cardiomegaly. No pulmonary venous congestion. Mild bibasilar atelectasis and or scarring. No pleural effusion or pneumothorax. Degenerative change thoracic spine. IMPRESSION: Mild bibasilar atelectasis and or scarring. Cardiomegaly. No pulmonary venous congestion. Electronically Signed   By: Marcello Moores  Register   On: 05/30/2020 09:14    Review of Systems  Constitutional: Positive for activity change and appetite change. Negative for chills and fever.  HENT: Negative.   Eyes: Negative.   Respiratory: Negative.   Cardiovascular: Negative.   Gastrointestinal: Positive for abdominal distention, diarrhea and nausea.  Endocrine: Negative.   Genitourinary: Negative.   Musculoskeletal: Positive for arthralgias, back pain and gait problem.  Skin: Negative.   Allergic/Immunologic: Negative.   Neurological: Positive for weakness. Negative for dizziness, syncope, speech difficulty and headaches.  Hematological: Negative.   Psychiatric/Behavioral: Negative.     Blood pressure (!) 107/57, pulse 74, temperature 97.6 F (36.4 C), temperature source Oral, resp. rate 16, height 5' 9.5" (1.765 m), weight 91.2 kg, SpO2 98 %. Physical Exam Constitutional:      Appearance: He is obese.  HENT:     Head: Normocephalic and atraumatic.     Nose: Nose normal.      Mouth/Throat:     Mouth: Mucous membranes are moist.     Pharynx: Oropharynx is clear.  Eyes:     Extraocular Movements: Extraocular movements intact.     Pupils: Pupils are equal, round, and reactive to light.  Cardiovascular:     Rate and Rhythm: Normal rate and regular rhythm.     Pulses: Normal pulses.  Pulmonary:     Effort: Pulmonary effort is normal. No respiratory distress.  Abdominal:     General: There is distension.     Palpations: Abdomen is soft.  Musculoskeletal:        General: Normal range of motion.     Cervical back: Normal range of motion and neck supple.  Skin:    General: Skin is warm and dry.     Capillary Refill: Capillary refill takes less than 2 seconds.       Neurological:     General: No focal deficit present.     Mental Status: He is alert and oriented to person, place, and time.     GCS: GCS eye subscore is 4. GCS verbal subscore is 5. GCS motor subscore is 6.     Cranial Nerves: No cranial nerve deficit.     Sensory: Sensation is intact.     Motor: Motor function is intact.     Coordination: Coordination is intact.  Psychiatric:        Mood and Affect: Mood normal.        Behavior: Behavior normal.      Assessment/Plan Patient will be admitted for further work up to include blood cultures, urine  culture, CXR, ESR, and CRP. His family has concern for c. Diff and request the patient be tested since he's been on antibiotics and at a skilled nursing facility recently. The patient will be kept NPO after midnight in the event that a wound wash out is deemed necessary by Dr. Arnoldo Morale.  Patricia Nettle, NP 05/30/2020, 11:48 AM

## 2020-05-31 LAB — GLUCOSE, CAPILLARY
Glucose-Capillary: 207 mg/dL — ABNORMAL HIGH (ref 70–99)
Glucose-Capillary: 218 mg/dL — ABNORMAL HIGH (ref 70–99)
Glucose-Capillary: 260 mg/dL — ABNORMAL HIGH (ref 70–99)
Glucose-Capillary: 261 mg/dL — ABNORMAL HIGH (ref 70–99)
Glucose-Capillary: 257 mg/dL — ABNORMAL HIGH (ref 70–99)

## 2020-05-31 MED ORDER — VANCOMYCIN HCL 2000 MG/400ML IV SOLN
2000.0000 mg | Freq: Once | INTRAVENOUS | Status: AC
  Administered 2020-05-31: 2000 mg via INTRAVENOUS
  Filled 2020-05-31: qty 400

## 2020-05-31 MED ORDER — LOPERAMIDE HCL 2 MG PO CAPS
2.0000 mg | ORAL_CAPSULE | ORAL | Status: AC | PRN
  Administered 2020-05-31: 2 mg via ORAL
  Filled 2020-05-31: qty 1

## 2020-05-31 MED ORDER — ENSURE ENLIVE PO LIQD
237.0000 mL | Freq: Three times a day (TID) | ORAL | Status: DC
  Administered 2020-05-31 – 2020-06-07 (×13): 237 mL via ORAL

## 2020-05-31 MED ORDER — VANCOMYCIN VARIABLE DOSE PER UNSTABLE RENAL FUNCTION (PHARMACIST DOSING): Status: DC

## 2020-05-31 NOTE — Progress Notes (Signed)
Initial Nutrition Assessment  DOCUMENTATION CODES:   Not applicable  INTERVENTION:  Provide Ensure Enlive po TID, each supplement provides 350 kcal and 20 grams of protein  Encourage adequate PO intake.   NUTRITION DIAGNOSIS:   Increased nutrient needs related to wound healing as evidenced by estimated needs.  GOAL:   Patient will meet greater than or equal to 90% of their needs  MONITOR:   PO intake,Supplement acceptance,Skin,Weight trends,Labs,I & O's  REASON FOR ASSESSMENT:   Malnutrition Screening Tool    ASSESSMENT:   75 y.o. male with a past medical history significant for uncontrolled diabetes, CKD, hypertension, and left BBB. Pt underwent an L4-5 decompression, instrumentation and fusion on 05/11/2020. Pt presents weakness, diarrhea and serosanguinous drainage from his surgical wound.   Family at bedside. She reports pt with a decreased appetite over the past 2-3 weeks. Meal completion has been 25% which family reports is similar to meal completion at home. Family has been encouraging pt po intake. Pt consumes nutritional supplements (Ensure/Glucerna) at home at least once daily. Pt with no significant weight loss per weight records. RD to order Ensure to aid in caloric and protein needs. Per MD, if pt with continued drainage from wound then will plan for I&D tomorrow.   Unable to complete Nutrition-Focused physical exam at this time. Pt requesting rest at this time.   Labs and medications reviewed.   Diet Order:   Diet Order            Diet NPO time specified  Diet effective midnight           Diet Carb Modified Fluid consistency: Thin; Room service appropriate? Yes  Diet effective now                 EDUCATION NEEDS:   Not appropriate for education at this time  Skin:  Skin Integrity Issues:: Other (Comment) Other: surgial wound on back  Last BM:  5/4  Height:   Ht Readings from Last 1 Encounters:  05/30/20 5' 9.5" (1.765 m)    Weight:   Wt  Readings from Last 1 Encounters:  05/30/20 91.2 kg    BMI:  Body mass index is 29.26 kg/m.  Estimated Nutritional Needs:   Kcal:  2000-2200  Protein:  100-115 grams  Fluid:  >/= 2 L/day  Corrin Parker, MS, RD, LDN RD pager number/after hours weekend pager number on Amion.

## 2020-05-31 NOTE — Progress Notes (Signed)
Subjective: The patient is alert and pleasant.  His daughter is at the bedside.  He complains of diarrhea.  He has been very slow to mobilize.  Objective: Vital signs in last 24 hours: Temp:  [97.7 F (36.5 C)-98.3 F (36.8 C)] 97.8 F (36.6 C) (05/04 0730) Pulse Rate:  [67-81] 69 (05/04 0730) Resp:  [12-27] 12 (05/04 0730) BP: (93-137)/(52-95) 137/68 (05/04 0730) SpO2:  [91 %-98 %] 97 % (05/04 0730) Estimated body mass index is 29.26 kg/m as calculated from the following:   Height as of this encounter: 5' 9.5" (1.765 m).   Weight as of this encounter: 91.2 kg.   Intake/Output from previous day: 05/03 0701 - 05/04 0700 In: 2100 [P.O.:240; I.V.:860; IV Piggyback:1000] Out: 450 [Urine:450] Intake/Output this shift: No intake/output data recorded.  Physical exam patient is alert and oriented.  His lower extremity strength is grossly normal.  The patient's lumbar wound has an occlusive dressing on there from yesterday.  There is a small amount of purulent drainage.  He has a small superficial dehiscence.  I cannot express any purulent matter.  Lab Results: Recent Labs    05/30/20 0837  WBC 8.5  HGB 10.2*  HCT 31.6*  PLT 274   BMET Recent Labs    05/30/20 0837  NA 134*  K 3.5  CL 101  CO2 21*  GLUCOSE 85  BUN 59*  CREATININE 3.09*  CALCIUM 8.1*    Studies/Results: DG Chest Port 1 View  Result Date: 05/30/2020 CLINICAL DATA:  Fatigue. EXAM: PORTABLE CHEST 1 VIEW COMPARISON:  11/12/2014. FINDINGS: Mediastinum and hilar structures normal. Cardiomegaly. No pulmonary venous congestion. Mild bibasilar atelectasis and or scarring. No pleural effusion or pneumothorax. Degenerative change thoracic spine. IMPRESSION: Mild bibasilar atelectasis and or scarring. Cardiomegaly. No pulmonary venous congestion. Electronically Signed   By: Marcello Moores  Register   On: 05/30/2020 09:14    Assessment/Plan: Wound drainage: I am going to observe the wound for now and start empiric  vancomycin.  I will discontinue his occlusive dressing and do twice daily gauze dressing changes.  If his drainage continues we will plan I&D for tomorrow.  We are awaiting his blood cultures.  I will add Imodium for his diarrhea as he is C. difficile negative.  I will ask PT to mobilize the patient as he has been quite slow to mobilize.  I have answered all their questions.  LOS: 1 day     Ophelia Charter 05/31/2020, 9:08 AM

## 2020-05-31 NOTE — Progress Notes (Signed)
Pharmacy Antibiotic Note  Jeffery Fernandez is a 75 y.o. male admitted on 05/30/2020 with wound infection.  Pharmacy has been consulted for vancomycin dosing. WBC wnl. AF, LA 1.3. SCr 3.09 (BL ~ 1.6-2).   Plan: -Vancomycin 2 gm IV load followed by dosing per levels in the setting of AKI -Monitor CBC, renal fx, cultures and clinical progress  Height: 5' 9.5" (176.5 cm) Weight: 91.2 kg (201 lb) IBW/kg (Calculated) : 71.85  Temp (24hrs), Avg:97.9 F (36.6 C), Min:97.7 F (36.5 C), Max:98.3 F (36.8 C)  Recent Labs  Lab 05/30/20 0837  WBC 8.5  CREATININE 3.09*  LATICACIDVEN 1.3    Estimated Creatinine Clearance: 22.5 mL/min (A) (by C-G formula based on SCr of 3.09 mg/dL (H)).    Allergies  Allergen Reactions  . Penicillins Other (See Comments)    Possibly rash - not sure Has patient had a PCN reaction causing immediate rash, facial/tongue/throat swelling, SOB or lightheadedness with hypotension:unknown Has patient had a PCN reaction causing severe rash involving mucus membranes or skin necrosis: Unknown Has patient had a PCN reaction that required hospitalization: Unknown Has patient had a PCN reaction occurring within the last 10 years: No Childhood reaction If all of the above answers are "NO", then may proceed with Cephalosporin use.     Antimicrobials this admission: Vancomycin 5/4 >>  Dose adjustments this admission:  Microbiology results: 5/3 BCx:  5/3 UCx:     Thank you for allowing pharmacy to be a part of this patient's care.  Albertina Parr, PharmD., BCPS, BCCCP Clinical Pharmacist Please refer to [facility] for unit-specific pharmacist

## 2020-05-31 NOTE — Evaluation (Signed)
Physical Therapy Evaluation Patient Details Name: Jeffery Fernandez MRN: [id number] DOB: 04/21/1945 Today's Date: 05/31/2020   History of Present Illness  Pt is a 75 y.o. M admitted with decreased appetite nausea, diarrhea, generalized weakness and seosanguineous drainage from his surgical wound. Significant PMH: uncontrolled diabetes, CKD, HTN, L BBB, L3 kyphoplasty, recent L4-5 decompression and fusion 05/11/2020. Discharged to [facility] 05/19/2020 and returned home 05/23/2020.  Clinical Impression  Pt presents with above. Presents with decreased functional mobility secondary to generalized weakness in setting of decreased oral intake, nausea, balance deficits, decreased cognition. Pt requiring min guard assist for taking pivotal steps from bed to chair using walker. Able participate in seated exercises for BLE strengthening. Further ambulation deferred in setting of pt persistent nausea (RN notified). BP supine 99/60, sitting 104/63, SpO2 95-96% on RA. Discussed with pt wife/daughter via phone; pt family goals are to increase his appetite/oral intake and return home with [facility] services.     Follow Up Recommendations Home health PT;Supervision/Assistance - 24 hour (pt family declining SNF)    Equipment Recommendations  None recommended by PT    Recommendations for Other Services       Precautions / Restrictions Precautions Precautions: Back;Fall Required Braces or Orthoses: Spinal Brace Spinal Brace: Lumbar corset;Applied in sitting position (Lumbar corset after sx 4/14, not present in room) Restrictions Weight Bearing Restrictions: No      Mobility  Bed Mobility Overal bed mobility: Needs Assistance Bed Mobility: Rolling;Sidelying to Sit Rolling: Supervision Sidelying to sit: Supervision       General bed mobility comments: No physical assist required, HOB elevated    Transfers Overall transfer level: Needs assistance Equipment used: Rolling walker (2 wheeled) Transfers: Sit to/from  Stand Sit to Stand: Min guard         General transfer comment: Min guard for safety  Ambulation/Gait Ambulation/Gait assistance: Min guard Gait Distance (Feet): 3 Feet Assistive device: Rolling walker (2 wheeled) Gait Pattern/deviations: Step-through pattern;Decreased stride length Gait velocity: decreased   General Gait Details: Pivotal steps from bed to chair, cues for sequencing/direction  Stairs            Wheelchair Mobility    Modified Rankin (Stroke Patients Only)       Balance Overall balance assessment: Needs assistance Sitting-balance support: Feet supported;No upper extremity supported Sitting balance-Leahy Scale: Good     Standing balance support: Bilateral upper extremity supported Standing balance-Leahy Scale: Poor Standing balance comment: reliant on UE support and external assist                             Pertinent Vitals/Pain Pain Assessment: No/denies pain    Home Living Family/patient expects to be discharged to:: Private residence Living Arrangements: Spouse/significant other Available Help at Discharge: Family;Available 24 hours/day Type of Home: House Home Access: Stairs to enter Entrance Stairs-Rails: Psychiatric nurse of Steps: 5-6 Home Layout: One level Home Equipment: Toilet riser;Walker - 2 wheels;Cane - single point;Grab bars - toilet;Shower seat      Prior Function Level of Independence: Needs assistance   Gait / Transfers Assistance Needed: Limited household ambulator using walker  ADL's / Homemaking Assistance Needed: Increased difficulty with ADL's due to weakness, decreased appetite        Hand Dominance   Dominant Hand: Right    Extremity/Trunk Assessment   Upper Extremity Assessment Upper Extremity Assessment: Defer to OT evaluation    Lower Extremity Assessment Lower Extremity Assessment: Overall WFL for  tasks assessed    Cervical / Trunk Assessment Cervical / Trunk  Assessment: Other exceptions Cervical / Trunk Exceptions: s/p lumbar fusion 4/14  Communication   Communication: HOH  Cognition Arousal/Alertness: Awake/alert Behavior During Therapy: Flat affect Overall Cognitive Status: Impaired/Different from baseline Area of Impairment: Memory;Safety/judgement                     Memory: Decreased recall of precautions;Decreased short-term memory   Safety/Judgement: Decreased awareness of deficits     General Comments: Difficulty verbalizing symptoms, STM deficits noted      General Comments      Exercises General Exercises - Lower Extremity Ankle Circles/Pumps: Both;10 reps;Seated Long Arc Quad: Both;10 reps;Seated Hip Flexion/Marching: Both;10 reps;Seated   Assessment/Plan    PT Assessment Patient needs continued PT services  PT Problem List Decreased strength;Decreased activity tolerance;Decreased balance;Decreased mobility;Decreased knowledge of use of DME       PT Treatment Interventions DME instruction;Gait training;Stair training;Functional mobility training;Balance training;Therapeutic activities;Therapeutic exercise;Patient/family education    PT Goals (Current goals can be found in the Care Plan section)  Acute Rehab PT Goals Patient Stated Goal: improve appetite, oral intake PT Goal Formulation: With patient/family Time For Goal Achievement: 06/14/20 Potential to Achieve Goals: Good    Frequency Min 3X/week   Barriers to discharge        Co-evaluation               AM-PAC PT "6 Clicks" Mobility  Outcome Measure Help needed turning from your back to your side while in a flat bed without using bedrails?: A Little Help needed moving from lying on your back to sitting on the side of a flat bed without using bedrails?: A Little Help needed moving to and from a bed to a chair (including a wheelchair)?: A Little Help needed standing up from a chair using your arms (e.g., wheelchair or bedside chair)?: A  Little Help needed to walk in hospital room?: A Little Help needed climbing 3-5 steps with a railing? : A Lot 6 Click Score: 17    End of Session Equipment Utilized During Treatment: Gait belt Activity Tolerance: Other (comment) (limited by nausea) Patient left: in chair;with call bell/phone within reach;with chair alarm set Nurse Communication: Mobility status PT Visit Diagnosis: Unsteadiness on feet (R26.81);Muscle weakness (generalized) (M62.81);History of falling (Z91.81)    Time: 8588-5027 PT Time Calculation (min) (ACUTE ONLY): 28 min   Charges:   PT Evaluation $PT Eval Moderate Complexity: 1 Mod PT Treatments $Therapeutic Activity: 8-22 mins        Wyona Almas, PT, DPT Acute Rehabilitation Services Pager [phone] Office [place] 05/31/2020, 5:14 PM

## 2020-05-31 NOTE — Plan of Care (Signed)

## 2020-06-01 LAB — BASIC METABOLIC PANEL
Anion gap: 9 (ref 5–15)
BUN: 47 mg/dL — ABNORMAL HIGH (ref 8–23)
CO2: 18 mmol/L — ABNORMAL LOW (ref 22–32)
Calcium: 8.1 mg/dL — ABNORMAL LOW (ref 8.9–10.3)
Chloride: 113 mmol/L — ABNORMAL HIGH (ref 98–111)
Creatinine, Ser: 1.85 mg/dL — ABNORMAL HIGH (ref 0.61–1.24)
GFR, Estimated: 37 mL/min — ABNORMAL LOW (ref >60)
Glucose, Bld: 168 mg/dL — ABNORMAL HIGH (ref 70–99)
Potassium: 3.9 mmol/L (ref 3.5–5.1)
Sodium: 140 mmol/L (ref 135–145)

## 2020-06-01 LAB — GLUCOSE, CAPILLARY
Glucose-Capillary: 142 mg/dL — ABNORMAL HIGH (ref 70–99)
Glucose-Capillary: 146 mg/dL — ABNORMAL HIGH (ref 70–99)
Glucose-Capillary: 148 mg/dL — ABNORMAL HIGH (ref 70–99)
Glucose-Capillary: 159 mg/dL — ABNORMAL HIGH (ref 70–99)
Glucose-Capillary: 171 mg/dL — ABNORMAL HIGH (ref 70–99)
Glucose-Capillary: 175 mg/dL — ABNORMAL HIGH (ref 70–99)
Glucose-Capillary: 234 mg/dL — ABNORMAL HIGH (ref 70–99)

## 2020-06-01 LAB — URINE CULTURE: Culture: NO GROWTH

## 2020-06-01 MED ORDER — VANCOMYCIN HCL 750 MG/150ML IV SOLN
750.0000 mg | INTRAVENOUS | Status: DC
  Administered 2020-06-01 – 2020-06-02 (×2): 750 mg via INTRAVENOUS
  Filled 2020-06-01 (×2): qty 150

## 2020-06-01 MED ORDER — HYDROCODONE-ACETAMINOPHEN 5-325 MG PO TABS
1.0000 | ORAL_TABLET | ORAL | Status: DC | PRN
  Administered 2020-06-01 – 2020-06-09 (×8): 1 via ORAL
  Administered 2020-06-09: 2 via ORAL
  Filled 2020-06-01: qty 2
  Filled 2020-06-01 (×4): qty 1
  Filled 2020-06-01: qty 2
  Filled 2020-06-01 (×3): qty 1

## 2020-06-01 MED ORDER — LIVING WELL WITH DIABETES BOOK
Freq: Once | Status: AC
  Filled 2020-06-01: qty 1

## 2020-06-01 NOTE — Progress Notes (Signed)
Subjective: The patient is alert and pleasant.  He is in no apparent distress.  He has ambulated with PT.  His wife is at the bedside.  His daughter is on the phone.  He complains of loss of appetite and wants to change over to hydrocodone.  Objective: Vital signs in last 24 hours: Temp:  [97.7 F (36.5 C)-98.6 F (37 C)] 98 F (36.7 C) (05/05 0758) Pulse Rate:  [54-70] 70 (05/05 0758) Resp:  [8-20] 20 (05/05 0758) BP: (101-139)/(59-73) 128/69 (05/05 0758) SpO2:  [91 %-98 %] 95 % (05/05 0758) Estimated body mass index is 29.26 kg/m as calculated from the following:   Height as of this encounter: 5' 9.5" (1.765 m).   Weight as of this encounter: 91.2 kg.   Intake/Output from previous day: 05/04 0701 - 05/05 0700 In: 2277.8 [I.V.:2277.8] Out: 940 [Urine:940] Intake/Output this shift: Total I/O In: -  Out: 200 [Urine:200]  Physical exam the patient is alert and oriented.  He is in no apparent distress.  His lower extremity strength is grossly normal.  His lumbar wound dressing has a moderate amount of drainage.  The wound edges are about erythrematous.  I cannot express any purulent material.  Lab Results: Recent Labs    05/30/20 0837  WBC 8.5  HGB 10.2*  HCT 31.6*  PLT 274   BMET Recent Labs    05/30/20 0837  NA 134*  K 3.5  CL 101  CO2 21*  GLUCOSE 85  BUN 59*  CREATININE 3.09*  CALCIUM 8.1*    Studies/Results: No results found.  Assessment/Plan: Wound infection: We discussed an incision and drainage versus continued antibiotics and observation.  He is not septic or febrile.  We will continue with empiric antibiotics and give it another day or 2.  I have answered all their questions.  LOS: 2 days     Ophelia Charter 06/01/2020, 10:41 AM

## 2020-06-01 NOTE — Plan of Care (Signed)

## 2020-06-01 NOTE — Progress Notes (Signed)
Pharmacy Antibiotic Note  Jeffery Fernandez is a 75 y.o. male admitted on 05/30/2020 with wound infection.  Pharmacy has been consulted for vancomycin dosing. WBC wnl. AF, LA 1.3. SCr 3.09 > 1.85 (BL ~ 1.6-2) Plan: -Vancomycin 2 gm IV load followed by vancomycin 750 mg IV Q 24 hours  -Monitor CBC, renal fx, cultures and clinical progress -If no improvement after abx, then I&D. Continue abx for now   Height: 5' 9.5" (176.5 cm) Weight: 91.2 kg (201 lb) IBW/kg (Calculated) : 71.85  Temp (24hrs), Avg:97.9 F (36.6 C), Min:97.6 F (36.4 C), Max:98.3 F (36.8 C)  Recent Labs  Lab 05/30/20 0837 06/01/20 1217  WBC 8.5  --   CREATININE 3.09* 1.85*  LATICACIDVEN 1.3  --     Estimated Creatinine Clearance: 37.6 mL/min (A) (by C-G formula based on SCr of 1.85 mg/dL (H)).    Allergies  Allergen Reactions  . Penicillins Other (See Comments)    Possibly rash - not sure Has patient had a PCN reaction causing immediate rash, facial/tongue/throat swelling, SOB or lightheadedness with hypotension:unknown Has patient had a PCN reaction causing severe rash involving mucus membranes or skin necrosis: Unknown Has patient had a PCN reaction that required hospitalization: Unknown Has patient had a PCN reaction occurring within the last 10 years: No Childhood reaction If all of the above answers are "NO", then may proceed with Cephalosporin use.     Antimicrobials this admission: Vancomycin 5/4 >>  Dose adjustments this admission:  Microbiology results: 5/3 BCx: ngtd 5/3 UCx:  negF   Thank you for allowing pharmacy to be a part of this patient's care.  Albertina Parr, PharmD., BCPS, BCCCP Clinical Pharmacist Please refer to [facility] for unit-specific pharmacist

## 2020-06-01 NOTE — Evaluation (Signed)
Occupational Therapy Evaluation Patient Details Name: Jeffery Fernandez MRN: [id number] DOB: 29-Aug-1945 Today's Date: 06/01/2020    History of Present Illness Pt is a 75 y.o. M admitted with decreased appetite nausea, diarrhea, generalized weakness and seosanguineous drainage from his surgical wound. Significant PMH: uncontrolled diabetes, CKD, HTN, L BBB, L3 kyphoplasty, recent L4-5 decompression and fusion 05/11/2020. Discharged to [facility] 05/19/2020 and returned home 05/23/2020.   Clinical Impression   Pt admitted with the above diagnosis and has the deficits listed below. Pt has been home since surgery and initially did well until he stopped eating and wound became infected. Feel pt would benefit from cont OT to increase independence with basic adls and adl transfers and get back into the routine of doing own adls so he can dc home safely with wife.  Pt was wearing pajamas each day at home and spending a lot of time in bed. Encouraged pt to get up and dressed daily and increase mobility and what he does for himself. Will continue to see and focus on these adls.     Follow Up Recommendations  Supervision/Assistance - 24 hour;Home health OT    Equipment Recommendations  None recommended by OT    Recommendations for Other Services       Precautions / Restrictions Precautions Precautions: Back;Fall Precaution Booklet Issued: No Precaution Comments: reviewed 3/3 back precautions. Able to recall 1/3 precautions with increased time Required Braces or Orthoses: Spinal Brace (for comfort only) Spinal Brace: Thoracolumbosacral orthotic;Applied in sitting position;Other (comment) (comfort only) Spinal Brace Comments: Pt does not have to wear the spinal brace per Dr. Arnoldo Morale. Restrictions Weight Bearing Restrictions: No      Mobility Bed Mobility Overal bed mobility: Needs Assistance Bed Mobility: Rolling;Sidelying to Sit Rolling: Supervision Sidelying to sit: Supervision       General bed  mobility comments: No physical assist required, HOB elevated    Transfers Overall transfer level: Needs assistance Equipment used: Rolling walker (2 wheeled) Transfers: Sit to/from [employer] Sit to Stand: Min guard Stand pivot transfers: Min guard       General transfer comment: Min guard for safety    Balance Overall balance assessment: Needs assistance Sitting-balance support: Feet supported;No upper extremity supported Sitting balance-Leahy Scale: Good Sitting balance - Comments: close supervision-min guard for static sitting balance   Standing balance support: Bilateral upper extremity supported Standing balance-Leahy Scale: Poor Standing balance comment: reliant on walker but appears steadier than 5/4.                           ADL either performed or assessed with clinical judgement   ADL Overall ADL's : Needs assistance/impaired Eating/Feeding: Independent   Grooming: Wash/dry hands;Wash/dry face;Oral care;Min guard;Standing Grooming Details (indicate cue type and reason): pt stood at sink to quickly wash hands. Upper Body Bathing: Set up;Sitting   Lower Body Bathing: Minimal assistance;Sit to/from stand;Cueing for compensatory techniques Lower Body Bathing Details (indicate cue type and reason): Pt crossed legs up to knees to wash feet and LEs using proper body mechanics. Upper Body Dressing : Set up;Sitting Upper Body Dressing Details (indicate cue type and reason): no brace needed Lower Body Dressing: Minimal assistance;Sit to/from stand;Cueing for compensatory techniques Lower Body Dressing Details (indicate cue type and reason): Pt able to sit in figure 4 position to donn socks and dhoes. Toilet Transfer: Minimal assistance;RW;Ambulation Armed forces technical officer Details (indicate cue type and reason): walked to toilet.  cues for hand placement Toileting-  Clothing Manipulation and Hygiene: Minimal assistance;Sit to/from stand;Cueing for  compensatory techniques Toileting - Clothing Manipulation Details (indicate cue type and reason): min assist to clean self well.     Functional mobility during ADLs: Minimal assistance;Rolling walker General ADL Comments: Pt overall doing well with adls. Pt can sit in figure 4 to dress self and bathe LEs. Pt has all equipment at home needed. Pt is very weak from not eating and fatigues quickly.     Vision Baseline Vision/History: Wears glasses Wears Glasses: At all times Patient Visual Report: No change from baseline Vision Assessment?: No apparent visual deficits     Perception Perception Perception Tested?: No   Praxis Praxis Praxis tested?: Within functional limits    Pertinent Vitals/Pain Pain Assessment: Faces Faces Pain Scale: Hurts little more Pain Location: back Pain Descriptors / Indicators: Grimacing;Operative site guarding Pain Intervention(s): Limited activity within patient's tolerance;Monitored during session;Premedicated before session;Repositioned     Hand Dominance Right   Extremity/Trunk Assessment Upper Extremity Assessment Upper Extremity Assessment: Overall WFL for tasks assessed   Lower Extremity Assessment Lower Extremity Assessment: Defer to PT evaluation   Cervical / Trunk Assessment Cervical / Trunk Assessment: Other exceptions Cervical / Trunk Exceptions: s/p lumbar fusion 4/14   Communication Communication Communication: HOH   Cognition Arousal/Alertness: Awake/alert Behavior During Therapy: Flat affect Overall Cognitive Status: Impaired/Different from baseline Area of Impairment: Safety/judgement;Problem solving;Memory                     Memory: Decreased recall of precautions;Decreased short-term memory   Safety/Judgement: Decreased awareness of deficits   Problem Solving: Slow processing General Comments: Pt with minor memory deficits. Pt has been in hospital twice, SNF once and home all in last month.   General  Comments  Feel pt will progress quickly once pt can eat and get strength back.  Wife wants to take him home and feel this is reasonable from OT standpoint.    Exercises     Shoulder Instructions      Home Living Family/patient expects to be discharged to:: Private residence Living Arrangements: Spouse/significant other Available Help at Discharge: Family;Available 24 hours/day Type of Home: House Home Access: Stairs to enter [employer] of Steps: 5-6 Entrance Stairs-Rails: Right;Left Home Layout: One level     Bathroom Shower/Tub: Walk-in shower;Tub/shower unit   Bathroom Toilet: Handicapped height Bathroom Accessibility: Yes   Home Equipment: Toilet riser;Walker - 2 wheels;Cane - single point;Grab bars - toilet;Shower seat      Lives With: Spouse    Prior Functioning/Environment Level of Independence: Needs assistance  Gait / Transfers Assistance Needed: Limited household ambulator using walker ADL's / Homemaking Assistance Needed: Increased difficulty with ADL's due to weakness, decreased appetite            OT Problem List: Impaired balance (sitting and/or standing);Decreased knowledge of use of DME or AE;Decreased knowledge of precautions;Pain      OT Treatment/Interventions: Self-care/ADL training;DME and/or AE instruction;Therapeutic activities;Cognitive remediation/compensation;Patient/family education;Balance training    OT Goals(Current goals can be found in the care plan section) Acute Rehab OT Goals Patient Stated Goal: improve appetite, oral intake OT Goal Formulation: With patient/family Time For Goal Achievement: 06/15/20 Potential to Achieve Goals: Good ADL Goals Pt Will Perform Lower Body Bathing: with modified independence;sit to/from stand Pt Will Perform Lower Body Dressing: with modified independence;sit to/from stand Pt Will Perform Tub/Shower Transfer: Shower transfer;shower seat;ambulating;rolling walker;with supervision Additional  ADL Goal #1: Pt will walk to bathroom with walker and toilet  with 3:1 over commode with mod I. Additional ADL Goal #2: Pt will gather all clothes prior to dressing from closet with walker with mod I.  OT Frequency: Min 2X/week   Barriers to D/C:    wife there to assist.       Co-evaluation              AM-PAC OT "6 Clicks" Daily Activity     Outcome Measure Help from another person eating meals?: None Help from another person taking care of personal grooming?: None Help from another person toileting, which includes using toliet, bedpan, or urinal?: A Little Help from another person bathing (including washing, rinsing, drying)?: A Little Help from another person to put on and taking off regular upper body clothing?: None Help from another person to put on and taking off regular lower body clothing?: A Little 6 Click Score: 21   End of Session Equipment Utilized During Treatment: Rolling walker Nurse Communication: Mobility status  Activity Tolerance: Patient tolerated treatment well Patient left: in chair;with call bell/phone within reach;with chair alarm set;with family/visitor present  OT Visit Diagnosis: Unsteadiness on feet (R26.81);Pain;Other symptoms and signs involving cognitive function Pain - part of body:  (back)                Time: 0935-1001 OT Time Calculation (min): 26 min Charges:  OT General Charges $OT Visit: 1 Visit OT Evaluation $OT Eval Moderate Complexity: 1 Mod OT Treatments $Self Care/Home Management : 8-22 mins  Glenford Peers 06/01/2020, 10:51 AM

## 2020-06-01 NOTE — Progress Notes (Signed)
Physical Therapy Treatment Patient Details Name: Jeffery Fernandez MRN: [id number] DOB: 1945-09-18 Today's Date: 06/01/2020    History of Present Illness Pt is a 75 y.o. M admitted with decreased appetite nausea, diarrhea, generalized weakness and seosanguineous drainage from his surgical wound. Significant PMH: uncontrolled diabetes, CKD, HTN, L BBB, L3 kyphoplasty, recent L4-5 decompression and fusion 05/11/2020. Discharged to [facility] 05/19/2020 and returned home 05/23/2020.    PT Comments    The pt was able to make good progress with ambulation and dynamic stability this session. He was able to complete >150 ft of hallway ambulation with balance challenges such as changes in gait speed, direction, quick turning, and walking marches without assist, but does require BUE support on RW. The pt continues to be challenged by recall of spinal precautions and requires verbal cues for technique and safety through session. Will benefit from continued skilled PT to practice stairs and further dynamic stability prior to anticipated return home.     Follow Up Recommendations  Home health PT;Supervision/Assistance - 24 hour     Equipment Recommendations  None recommended by PT    Recommendations for Other Services       Precautions / Restrictions Precautions Precautions: Back;Fall Precaution Booklet Issued: Yes (comment) Precaution Comments: pt able to recall 2/3 with cues for "BLT" from wife Required Braces or Orthoses: Spinal Brace (for comfort) Spinal Brace: Lumbar corset;Applied in sitting position Spinal Brace Comments: Pt does not have to wear the spinal brace per Dr. Arnoldo Morale. Restrictions Weight Bearing Restrictions: No    Mobility  Bed Mobility Overal bed mobility: Needs Assistance Bed Mobility: Rolling;Sidelying to Sit Rolling: Supervision Sidelying to sit: Supervision       General bed mobility comments: No physical assist required, verbal cues for log roll HOB elevated     Transfers Overall transfer level: Needs assistance Equipment used: Rolling walker (2 wheeled) Transfers: Sit to/from Stand Sit to Stand: Min guard Stand pivot transfers: Min guard       General transfer comment: Min guard for safety  Ambulation/Gait Ambulation/Gait assistance: Min guard Gait Distance (Feet): 150 Feet Assistive device: Rolling walker (2 wheeled) Gait Pattern/deviations: Step-through pattern;Decreased stride length Gait velocity: 0.4 m/s Gait velocity interpretation: 1.31 - 2.62 ft/sec, indicative of limited community ambulator General Gait Details: slow but steady initially, x1 minor LOB with leaning to R requiring minA to steady. pt able to demo good change in speed, direction, and turning without LOB. heavy reliance on RW         Balance Overall balance assessment: Needs assistance Sitting-balance support: Feet supported;No upper extremity supported Sitting balance-Leahy Scale: Good Sitting balance - Comments: close supervision-min guard for static sitting balance   Standing balance support: Bilateral upper extremity supported Standing balance-Leahy Scale: Poor Standing balance comment: reliant on walker but appears steadier than 5/4.             High level balance activites: Backward walking;Direction changes;Turns;Sudden stops;Other (comment) High Level Balance Comments: pt tolerated well with BUE support but minG only, no physical assist. slowed movements at times to increase safety, but no LOB.            Cognition Arousal/Alertness: Awake/alert Behavior During Therapy: Flat affect Overall Cognitive Status: Impaired/Different from baseline Area of Impairment: Safety/judgement;Problem solving;Memory                     Memory: Decreased recall of precautions;Decreased short-term memory Following Commands: Follows one step commands consistently;Follows multi-step commands with increased time Safety/Judgement: Decreased  awareness  of deficits   Problem Solving: Slow processing General Comments: Pt slightly HOH, asking for cues or questions to be repeated. Pt benefits from cues for spinal precuations with movements, needs cues to remember spinal precautions. motivated to return home. following most commands with increased time or repeated cues      Exercises Other Exercises Other Exercises: walking marches 2 x 15    General Comments General comments (skin integrity, edema, etc.): wife present and supportive, pt motivated to return home      Pertinent Vitals/Pain Pain Assessment: Faces Faces Pain Scale: Hurts a little bit Pain Location: back Pain Descriptors / Indicators: Grimacing;Operative site guarding Pain Intervention(s): Limited activity within patient's tolerance;Monitored during session;Repositioned           PT Goals (current goals can now be found in the care plan section) Acute Rehab PT Goals Patient Stated Goal: improve appetite, oral intake PT Goal Formulation: With patient/family Time For Goal Achievement: 06/14/20 Potential to Achieve Goals: Good Progress towards PT goals: Progressing toward goals    Frequency    Min 3X/week      PT Plan Current plan remains appropriate       AM-PAC PT "6 Clicks" Mobility   Outcome Measure  Help needed turning from your back to your side while in a flat bed without using bedrails?: A Little Help needed moving from lying on your back to sitting on the side of a flat bed without using bedrails?: A Little Help needed moving to and from a bed to a chair (including a wheelchair)?: A Little Help needed standing up from a chair using your arms (e.g., wheelchair or bedside chair)?: A Little Help needed to walk in hospital room?: A Little Help needed climbing 3-5 steps with a railing? : A Lot 6 Click Score: 17    End of Session Equipment Utilized During Treatment: Gait belt;Back brace Activity Tolerance: Patient tolerated treatment well;No increased  pain Patient left: in chair;with call bell/phone within reach;with chair alarm set Nurse Communication: Mobility status PT Visit Diagnosis: Unsteadiness on feet (R26.81);Muscle weakness (generalized) (M62.81);History of falling (Z91.81)     Time: 8413-2440 PT Time Calculation (min) (ACUTE ONLY): 25 min  Charges:  $Gait Training: 23-37 mins                     Karma Ganja, PT, DPT   Acute Rehabilitation Department Pager #: [phone]   Otho Bellows 06/01/2020, 2:28 PM

## 2020-06-01 NOTE — Progress Notes (Signed)
Inpatient Diabetes Program Recommendations  AACE/ADA: New Consensus Statement on Inpatient Glycemic Control (2015)  Target Ranges:  Prepandial:   less than 140 mg/dL      Peak postprandial:   less than 180 mg/dL (1-2 hours)      Critically ill patients:  140 - 180 mg/dL   Lab Results  Component Value Date   GLUCAP 146 (H) 06/01/2020   HGBA1C 9.3 (H) 05/11/2020    Review of Glycemic Control Results for ZURICH, CARRENO (MRN [id number]) as of 06/01/2020 11:46  Ref. Range 05/31/2020 21:39 06/01/2020 00:10 06/01/2020 03:14 06/01/2020 07:57 06/01/2020 11:25  Glucose-Capillary Latest Ref Range: 70 - 99 mg/dL 257 (H) 234 (H) 175 (H) 148 (H) 146 (H)   Diabetes history: DM2 Outpatient Diabetes medications: Lantus 40 units + Glucotrol 10 mg bid Current orders for Inpatient glycemic control: Lantus 40 units + Glucotrol 10 mg bid  Inpatient Diabetes Program Recommendations:   -Add Novolog 0-9 units tid + hs 0-5 units Ordered Living Well With Diabetes for patient to review.  Thank you, Nani Gasser. Dejana Pugsley, RN, MSN, CDE  Diabetes Coordinator Inpatient Glycemic Control Team Team Pager [phone] (8am-5pm) 06/01/2020 11:49 AM

## 2020-06-02 LAB — GLUCOSE, CAPILLARY
Glucose-Capillary: 105 mg/dL — ABNORMAL HIGH (ref 70–99)
Glucose-Capillary: 94 mg/dL (ref 70–99)
Glucose-Capillary: 113 mg/dL — ABNORMAL HIGH (ref 70–99)
Glucose-Capillary: 88 mg/dL (ref 70–99)
Glucose-Capillary: 89 mg/dL (ref 70–99)

## 2020-06-02 NOTE — Progress Notes (Signed)
Physical Therapy Treatment Patient Details Name: Jeffery Fernandez MRN: [id number] DOB: Jan 07, 1946 Today's Date: 06/02/2020    History of Present Illness Pt is a 75 y.o. M admitted with decreased appetite nausea, diarrhea, generalized weakness and seosanguineous drainage from his surgical wound. Significant PMH: uncontrolled diabetes, CKD, HTN, L BBB, L3 kyphoplasty, recent L4-5 decompression and fusion 05/11/2020. Discharged to [facility] 05/19/2020 and returned home 05/23/2020.    PT Comments    The pt was able to demo good progress with mobility and progressed to complete stairs this afternoon. The pt reports he is generally not feeling as good as he was yesterday, but was able to complete hallway ambulation with RW and minG without overt LOB, and completed x5 stairs with minA through HHA. The pt continues to use slow but generally steady gait, but demos increased instability and need for assistance with navigation in tight spaces, needing assist and increased cues for maintaining position in RW as well as for application of spinal precautions to mobility. The pt broke multiple precautions during session when allowed to attempt movement without direct PT cues, will need supervision with mobility to maintain safe mobility and movements at home. The pt will continue to benefit from skilled PT acutely to progress endurance, strength, dynamic stability, and safety awareness prior to d/c home.    Follow Up Recommendations  Home health PT;Supervision/Assistance - 24 hour     Equipment Recommendations  None recommended by PT    Recommendations for Other Services       Precautions / Restrictions Precautions Precautions: Back;Fall Precaution Booklet Issued: Yes (comment) Precaution Comments: pt required significant cues for precautions and application of precautions to mobility Required Braces or Orthoses: Spinal Brace (for comfort) Spinal Brace: Lumbar corset;Applied in sitting position Spinal Brace  Comments: Pt does not have to wear the spinal brace per Dr. Arnoldo Morale. Restrictions Weight Bearing Restrictions: No    Mobility  Bed Mobility Overal bed mobility: Needs Assistance Bed Mobility: Sit to Supine;Rolling;Sidelying to Sit Rolling: Supervision Sidelying to sit: Supervision   Sit to supine: Supervision   General bed mobility comments: pt able to complete rolling with VC only, reaching for bed rail to complete rolling for log roll out of bed. pt cued to use log roll to return to supine, quickly twisted back into bed without log roll and asked "did I do it right?" after completing movement, discussed how his movement breaks precautions    Transfers Overall transfer level: Needs assistance Equipment used: Rolling walker (2 wheeled) Transfers: Sit to/from Stand Sit to Stand: Min guard         General transfer comment: Min guard for safety. significant cues for keeping RW close as he backs up to sit.  Ambulation/Gait Ambulation/Gait assistance: Min guard Gait Distance (Feet): 200 Feet Assistive device: Rolling walker (2 wheeled) Gait Pattern/deviations: Step-through pattern;Decreased stride length Gait velocity: 0.47 m/s Gait velocity interpretation: <1.8 ft/sec, indicate of risk for recurrent falls General Gait Details: slow but steady. no overt LOB today. pt reports feeling slightly more pain after stairs   Stairs Stairs: Yes Stairs assistance: Min assist Stair Management: One rail Left;Step to pattern;Forwards Number of Stairs: 5 General stair comments: instructed patient on safe stair negotiation and use of single hand rail to simulate home environment       Balance Overall balance assessment: Needs assistance Sitting-balance support: Feet supported;No upper extremity supported Sitting balance-Leahy Scale: Good Sitting balance - Comments: close supervision-min guard for static sitting balance   Standing balance support: Bilateral  upper extremity  supported Standing balance-Leahy Scale: Poor Standing balance comment: reliant on walker but appears steadier than 5/4.                            Cognition Arousal/Alertness: Awake/alert Behavior During Therapy: Flat affect Overall Cognitive Status: Impaired/Different from baseline Area of Impairment: Safety/judgement;Problem solving;Memory                     Memory: Decreased recall of precautions;Decreased short-term memory Following Commands: Follows one step commands consistently;Follows multi-step commands with increased time Safety/Judgement: Decreased awareness of deficits   Problem Solving: Slow processing General Comments: pt with noted deficits in STM with regards to spinal precautions. needed constant cues. unable to don/doff brace without assist. frequent cues for safety with RW with minimal carry over      Exercises Other Exercises Other Exercises: walking marches 2 x 15    General Comments General comments (skin integrity, edema, etc.): pt reports pain in L hip after stairs, RN alerted      Pertinent Vitals/Pain Pain Assessment: Faces Faces Pain Scale: Hurts little more Pain Location: L hip Pain Descriptors / Indicators: Grimacing;Operative site guarding Pain Intervention(s): Limited activity within patient's tolerance;Monitored during session;Repositioned           PT Goals (current goals can now be found in the care plan section) Acute Rehab PT Goals Patient Stated Goal: improve appetite, oral intake PT Goal Formulation: With patient/family Time For Goal Achievement: 06/14/20 Potential to Achieve Goals: Good Progress towards PT goals: Progressing toward goals    Frequency    Min 3X/week      PT Plan Current plan remains appropriate       AM-PAC PT "6 Clicks" Mobility   Outcome Measure  Help needed turning from your back to your side while in a flat bed without using bedrails?: A Little Help needed moving from lying on  your back to sitting on the side of a flat bed without using bedrails?: A Little Help needed moving to and from a bed to a chair (including a wheelchair)?: A Little Help needed standing up from a chair using your arms (e.g., wheelchair or bedside chair)?: A Little Help needed to walk in hospital room?: A Little Help needed climbing 3-5 steps with a railing? : A Little 6 Click Score: 18    End of Session Equipment Utilized During Treatment: Gait belt;Back brace Activity Tolerance: Patient tolerated treatment well;No increased pain Patient left: in bed;with bed alarm set;with call bell/phone within reach;with nursing/sitter in room Nurse Communication: Mobility status PT Visit Diagnosis: Unsteadiness on feet (R26.81);Muscle weakness (generalized) (M62.81);History of falling (Z91.81)     Time: 1962-2297 PT Time Calculation (min) (ACUTE ONLY): 31 min  Charges:  $Gait Training: 23-37 mins                     Karma Ganja, PT, DPT   Acute Rehabilitation Department Pager #: [phone]   Otho Bellows 06/02/2020, 3:55 PM

## 2020-06-02 NOTE — Progress Notes (Signed)
Subjective: The patient is alert and pleasant.  He is in no apparent distress.  He has no complaints.  Objective: Vital signs in last 24 hours: Temp:  [97.5 F (36.4 C)-98.2 F (36.8 C)] 98 F (36.7 C) (05/06 0257) Pulse Rate:  [59-73] 59 (05/06 0257) Resp:  [14-20] 14 (05/06 0257) BP: (111-172)/(54-89) 132/64 (05/06 0257) SpO2:  [95 %-99 %] 97 % (05/06 0257) Estimated body mass index is 29.26 kg/m as calculated from the following:   Height as of this encounter: 5' 9.5" (1.765 m).   Weight as of this encounter: 91.2 kg.   Intake/Output from previous day: 05/05 0701 - 05/06 0700 In: 1881.4 [I.V.:1730.7; IV Piggyback:150.7] Out: 950 [Urine:950] Intake/Output this shift: No intake/output data recorded.  Physical exam the patient is alert and pleasant.  His strength is normal.  His lumbar wound dressing has a small amount of discharge.  Lab Results: Recent Labs    05/30/20 0837  WBC 8.5  HGB 10.2*  HCT 31.6*  PLT 274   BMET Recent Labs    05/30/20 0837 06/01/20 1217  NA 134* 140  K 3.5 3.9  CL 101 113*  CO2 21* 18*  GLUCOSE 85 168*  BUN 59* 47*  CREATININE 3.09* 1.85*  CALCIUM 8.1* 8.1*    Studies/Results: No results found.  Assessment/Plan: Wound drainage: We will continue with observation and empiric vancomycin.  If he continues to drain throughout the weekend I may do an I&D on Monday.  Have answered all his questions.  LOS: 3 days     Ophelia Charter 06/02/2020, 7:01 AM

## 2020-06-03 LAB — GLUCOSE, CAPILLARY
Glucose-Capillary: 104 mg/dL — ABNORMAL HIGH (ref 70–99)
Glucose-Capillary: 134 mg/dL — ABNORMAL HIGH (ref 70–99)
Glucose-Capillary: 36 mg/dL — CL (ref 70–99)
Glucose-Capillary: 52 mg/dL — ABNORMAL LOW (ref 70–99)
Glucose-Capillary: 60 mg/dL — ABNORMAL LOW (ref 70–99)
Glucose-Capillary: 62 mg/dL — ABNORMAL LOW (ref 70–99)
Glucose-Capillary: 70 mg/dL (ref 70–99)
Glucose-Capillary: 83 mg/dL (ref 70–99)
Glucose-Capillary: 87 mg/dL (ref 70–99)
Glucose-Capillary: 91 mg/dL (ref 70–99)
Glucose-Capillary: 92 mg/dL (ref 70–99)

## 2020-06-03 LAB — BASIC METABOLIC PANEL
Anion gap: 5 (ref 5–15)
BUN: 24 mg/dL — ABNORMAL HIGH (ref 8–23)
CO2: 22 mmol/L (ref 22–32)
Calcium: 8.1 mg/dL — ABNORMAL LOW (ref 8.9–10.3)
Chloride: 114 mmol/L — ABNORMAL HIGH (ref 98–111)
Creatinine, Ser: 1.5 mg/dL — ABNORMAL HIGH (ref 0.61–1.24)
GFR, Estimated: 48 mL/min — ABNORMAL LOW (ref >60)
Glucose, Bld: 91 mg/dL (ref 70–99)
Potassium: 4 mmol/L (ref 3.5–5.1)
Sodium: 141 mmol/L (ref 135–145)

## 2020-06-03 MED ORDER — INSULIN GLARGINE 100 UNIT/ML ~~LOC~~ SOLN
30.0000 [IU] | Freq: Every day | SUBCUTANEOUS | Status: DC
  Administered 2020-06-04 – 2020-06-09 (×6): 30 [IU] via SUBCUTANEOUS
  Filled 2020-06-03 (×6): qty 0.3

## 2020-06-03 MED ORDER — VANCOMYCIN HCL 1250 MG/250ML IV SOLN
1250.0000 mg | INTRAVENOUS | Status: DC
  Administered 2020-06-03 – 2020-06-05 (×3): 1250 mg via INTRAVENOUS
  Filled 2020-06-03 (×3): qty 250

## 2020-06-03 MED ORDER — LOPERAMIDE HCL 2 MG PO CAPS
2.0000 mg | ORAL_CAPSULE | ORAL | Status: DC | PRN
  Administered 2020-06-04 – 2020-06-06 (×3): 2 mg via ORAL
  Filled 2020-06-03 (×3): qty 1

## 2020-06-03 MED ORDER — DEXTROSE 50 % IV SOLN
INTRAVENOUS | Status: AC
  Filled 2020-06-03: qty 50

## 2020-06-03 NOTE — Progress Notes (Signed)
PT Cancellation Note  Patient Details Name: Jeffery Fernandez MRN: [id number] DOB: 01/02/1946   Cancelled Treatment:    Reason Eval/Treat Not Completed: Medical issues which prohibited therapy at this time as blood glucose recorded as 36 this afternoon. PT will hold and continue with established plan of care as appropriate.   Karma Ganja, PT, DPT   Acute Rehabilitation Department Pager #: [phone]  Otho Bellows 06/03/2020, 4:05 PM

## 2020-06-03 NOTE — Progress Notes (Signed)
Pharmacy Antibiotic Note  Jeffery Fernandez is a 75 y.o. male admitted on 05/30/2020 with wound infection. Pharmacy has been consulted for vancomycin dosing - day #4. SCr trend down to 1.5 today (BL ~ 1.6-2).  Plan: Adjust vancomycin to 1250mg  IV q24h for improving renal function. Goal AUC 400-550. Expected AUC: 486 SCr used: 1.5 Monitor clinical progress, c/s, renal function F/u de-escalation plan/LOT, vancomycin levels as indicated Planning I&D on Monday per NSGY   Height: 5' 9.5" (176.5 cm) Weight: 91.2 kg (201 lb) IBW/kg (Calculated) : 71.85  Temp (24hrs), Avg:97.9 F (36.6 C), Min:97.7 F (36.5 C), Max:98.5 F (36.9 C)  Recent Labs  Lab 05/30/20 0837 06/01/20 1217 06/03/20 0208  WBC 8.5  --   --   CREATININE 3.09* 1.85* 1.50*  LATICACIDVEN 1.3  --   --     Estimated Creatinine Clearance: 46.4 mL/min (A) (by C-G formula based on SCr of 1.5 mg/dL (H)).    Allergies  Allergen Reactions  . Penicillins Other (See Comments)    Possibly rash - not sure Has patient had a PCN reaction causing immediate rash, facial/tongue/throat swelling, SOB or lightheadedness with hypotension:unknown Has patient had a PCN reaction causing severe rash involving mucus membranes or skin necrosis: Unknown Has patient had a PCN reaction that required hospitalization: Unknown Has patient had a PCN reaction occurring within the last 10 years: No Childhood reaction If all of the above answers are "NO", then may proceed with Cephalosporin use.     Antimicrobials this admission: Vancomycin 5/4 >>  Microbiology results: 5/3 BCx: ngtd 5/3 UCx: neg 5/3 cdiff: neg   Arturo Morton, PharmD, BCPS Please check AMION for all [facility] contact numbers Clinical Pharmacist 06/03/2020 10:46 AM

## 2020-06-03 NOTE — Progress Notes (Signed)
Subjective: The patient is alert and pleasant.  His wife is at the bedside.  Objective: Vital signs in last 24 hours: Temp:  [97.7 F (36.5 C)-98.5 F (36.9 C)] 97.9 F (36.6 C) (05/07 0815) Pulse Rate:  [60-68] 63 (05/07 0815) Resp:  [13-20] 19 (05/07 0815) BP: (137-157)/(64-73) 153/68 (05/07 0815) SpO2:  [94 %-97 %] 96 % (05/07 0815) Estimated body mass index is 29.26 kg/m as calculated from the following:   Height as of this encounter: 5' 9.5" (1.765 m).   Weight as of this encounter: 91.2 kg.   Intake/Output from previous day: 05/06 0701 - 05/07 0700 In: 1942.6 [P.O.:550; I.V.:1392.6] Out: 400 [Urine:400] Intake/Output this shift: No intake/output data recorded.  Physical exam patient is alert and pleasant.  He looks well.  His lower extremity strength is grossly normal.  The patient's lumbar wound still has a superficial dehiscence and some drainage on the dressing.  I cannot express any purulent material.  Lab Results: No results for input(s): WBC, HGB, HCT, PLT in the last 72 hours. BMET Recent Labs    06/01/20 1217 06/03/20 0208  NA 140 141  K 3.9 4.0  CL 113* 114*  CO2 18* 22  GLUCOSE 168* 91  BUN 47* 24*  CREATININE 1.85* 1.50*  CALCIUM 8.1* 8.1*    Studies/Results: No results found.  Assessment/Plan: Wound drainage: I am not sure make any progress.  We will tentatively plan an I&D on Monday.  We discussed the surgery and the risk.  I have answered all their questions.  LOS: 4 days     Ophelia Charter 06/03/2020, 9:54 AM

## 2020-06-03 NOTE — Progress Notes (Signed)
.  Hypoglycemic Event  CBG: 36  Treatment: no available IV, attempt x2, waiting on IV team. Given juice, icecream, strawberries  Symptoms: asymptomatic  Follow-up CBG: Time:1616 CBG Result:52  Possible Reasons for Event: Pt not eating.  Follow-up CBG: Time:1631 CBG Result:60  Given peanut butter crackers, and glucerna. Still awaiting IV team consult.  Follow-up CBG: [id number] CBG Result:92  Follow-up CBG: Time:1734 CBG Result:104  Pt encouraged to eat and educated on diabetic management.   Jeffery Fernandez

## 2020-06-04 LAB — CULTURE, BLOOD (ROUTINE X 2)
Culture: NO GROWTH
Culture: NO GROWTH
Special Requests: ADEQUATE

## 2020-06-04 LAB — GLUCOSE, CAPILLARY
Glucose-Capillary: 127 mg/dL — ABNORMAL HIGH (ref 70–99)
Glucose-Capillary: 145 mg/dL — ABNORMAL HIGH (ref 70–99)
Glucose-Capillary: 150 mg/dL — ABNORMAL HIGH (ref 70–99)
Glucose-Capillary: 151 mg/dL — ABNORMAL HIGH (ref 70–99)
Glucose-Capillary: 157 mg/dL — ABNORMAL HIGH (ref 70–99)
Glucose-Capillary: 160 mg/dL — ABNORMAL HIGH (ref 70–99)
Glucose-Capillary: 53 mg/dL — ABNORMAL LOW (ref 70–99)
Glucose-Capillary: 63 mg/dL — ABNORMAL LOW (ref 70–99)
Glucose-Capillary: 92 mg/dL (ref 70–99)

## 2020-06-04 MED ORDER — DEXTROSE 50 % IV SOLN
INTRAVENOUS | Status: AC
  Administered 2020-06-04: 25 mL
  Filled 2020-06-04: qty 50

## 2020-06-04 NOTE — Progress Notes (Addendum)
Hypoglycemic Event  CBG: 53  Treatment: 77ml Dextrose amp  Symptoms: none; pt was asleep  Follow-up CBG: Time: 4:38 CBG Result:127  Possible Reasons for Event: poor oral intake Comments/MD notified: on call notified    Jeffery Fernandez L

## 2020-06-04 NOTE — Progress Notes (Addendum)
Hypoglycemic Event  CBG: 63  Treatment: 98ml dextrose Symptoms: none; routine check Follow-up CBG: Time: 00:16              CBG Result:160  Possible Reasons for Event: pt has had poor apetite due to nausea  Comments/MD notified: on call notified    Jeffery Fernandez L

## 2020-06-04 NOTE — Progress Notes (Signed)
Occupational Therapy Treatment Patient Details Name: Jeffery Fernandez MRN: [id number] DOB: [birth year] Today's Date: 06/04/2020    History of present illness Pt is a 75 y.o. M admitted with decreased appetite nausea, diarrhea, generalized weakness and seosanguineous drainage from his surgical wound. Significant PMH: uncontrolled diabetes, CKD, HTN, L BBB, L3 kyphoplasty, recent L4-5 decompression and fusion 05/11/2020. Discharged to [facility] 05/19/2020 and returned home 05/23/2020.   OT comments  Pt progressing to minguardA for mobility and minA overall for ADL. Pt able to abide by back precautions. Pt ambulating x40' x3 times in room with RW with minguardA. Pt using figure 4 technique for LB ADL in bed. Pt pulling pants to waist with minA. Family in room supportive. Pt still requires cues for back precautions and for log roll technique. Pt safe to go home with 24/7 assist with [facility] therapies. Pt would benefit from continued OT skilled services. OT following acutely. VSS. (chair alarm activated, daughter and RN in room).    Follow Up Recommendations  Home health OT;Supervision/Assistance - 24 hour    Equipment Recommendations  None recommended by OT    Recommendations for Other Services      Precautions / Restrictions Precautions Precautions: Back;Fall Precaution Comments: Pt aware of precautions today Spinal Brace Comments: Pt does not have to wear the spinal brace per Dr. Arnoldo Morale. Chose not to wear Restrictions Weight Bearing Restrictions: No       Mobility Bed Mobility Overal bed mobility: Needs Assistance Bed Mobility: Rolling;Sidelying to Sit Rolling: Supervision Sidelying to sit: Supervision       General bed mobility comments: Cues for log roll and for hand placement; poor carry over skills from PT session(2 days ago)    Transfers Overall transfer level: Needs assistance Equipment used: Rolling walker (2 wheeled) Transfers: Sit to/from [facility] Sit to Stand:  Min guard Stand pivot transfers: Min guard       General transfer comment: MinguardA for mobility with RW and cues for safety    Balance Overall balance assessment: Needs assistance Sitting-balance support: Feet supported;No upper extremity supported Sitting balance-Leahy Scale: Good     Standing balance support: Bilateral upper extremity supported Standing balance-Leahy Scale: Poor Standing balance comment: RW for support;                           ADL either performed or assessed with clinical judgement   ADL Overall ADL's : Needs assistance/impaired Eating/Feeding: Independent   Grooming: Set up;Sitting               Lower Body Dressing: Minimal assistance;Sitting/lateral leans;Sit to/from stand Lower Body Dressing Details (indicate cue type and reason): Fiure 4 in bed supported sitting; pulling briefs to waist with minA in standing. Toilet Transfer: Min guard;Ambulation;BSC;RW Toilet Transfer Details (indicate cue type and reason): simulated to recliner         Functional mobility during ADLs: Min guard;Rolling walker;Cueing for safety General ADL Comments: Pt able to abide by back precautions. Pt ambulating x40' x3 times in room with RW with minguardA.     Vision   Vision Assessment?: No apparent visual deficits Additional Comments: wears glasses   Perception     Praxis      Cognition Arousal/Alertness: Awake/alert Behavior During Therapy: WFL for tasks assessed/performed Overall Cognitive Status: Impaired/Different from baseline Area of Impairment: Memory;Following commands                     Memory:  Decreased recall of precautions;Decreased short-term memory Following Commands: Follows one step commands with increased time     Problem Solving: Slow processing General Comments: Pt noted to have short term memory deficits with decreased ability to recall precautions.        Exercises     Shoulder Instructions        General Comments Pt reports belly pain/gassy. Pt's daughter in room.    Pertinent Vitals/ Pain       Pain Assessment: 0-10 Pain Score: 4  Pain Location: stomach Pain Descriptors / Indicators: Discomfort Pain Intervention(s): Monitored during session  Home Living                                          Prior Functioning/Environment              Frequency  Min 2X/week        Progress Toward Goals  OT Goals(current goals can now be found in the care plan section)  Progress towards OT goals: Progressing toward goals  Acute Rehab OT Goals Patient Stated Goal: improve appetite, oral intake OT Goal Formulation: With patient/family Time For Goal Achievement: 06/15/20 Potential to Achieve Goals: Good ADL Goals Pt Will Perform Grooming: with supervision;standing Pt Will Perform Upper Body Bathing: with supervision;sitting Pt Will Perform Lower Body Bathing: with modified independence;sit to/from stand Pt Will Perform Upper Body Dressing: with supervision;sitting Pt Will Perform Lower Body Dressing: with modified independence;sit to/from stand Pt Will Transfer to Toilet: with supervision;ambulating;regular height toilet;bedside commode;grab bars Pt Will Perform Toileting - Clothing Manipulation and hygiene: with supervision;sit to/from stand Pt Will Perform Tub/Shower Transfer: Shower transfer;shower seat;ambulating;rolling walker;with supervision Additional ADL Goal #1: Pt will walk to bathroom with walker and toilet with 3:1 over commode with mod I. Additional ADL Goal #2: Pt will gather all clothes prior to dressing from closet with walker with mod I.  Plan Discharge plan remains appropriate    Co-evaluation                 AM-PAC OT "6 Clicks" Daily Activity     Outcome Measure   Help from another person eating meals?: None Help from another person taking care of personal grooming?: None Help from another person toileting, which includes  using toliet, bedpan, or urinal?: A Little Help from another person bathing (including washing, rinsing, drying)?: A Little Help from another person to put on and taking off regular upper body clothing?: None Help from another person to put on and taking off regular lower body clothing?: A Little 6 Click Score: 21    End of Session Equipment Utilized During Treatment: Gait belt;Rolling walker  OT Visit Diagnosis: Unsteadiness on feet (R26.81);Muscle weakness (generalized) (M62.81);Pain Pain - part of body:  (back)   Activity Tolerance Patient tolerated treatment well   Patient Left in chair;with call bell/phone within reach;with chair alarm set;with nursing/sitter in room;with family/visitor present   Nurse Communication Mobility status;Precautions        Time: 1310-1340 OT Time Calculation (min): 30 min  Charges: OT General Charges $OT Visit: 1 Visit OT Treatments $Self Care/Home Management : 8-22 mins $Therapeutic Activity: 8-22 mins  Jefferey Pica, OTR/L Acute Rehabilitation Services Pager: [phone] Office: [phone]   Ahleah Simko C 06/04/2020, 2:13 PM

## 2020-06-04 NOTE — Progress Notes (Signed)
Patient ID: Jeffery Fernandez, male   DOB: 09/23/1945, 75 y.o.   MRN: [id number] BP (!) 154/71   Pulse (!) 54   Temp 98.7 F (37.1 C) (Oral)   Resp (!) 23   Ht 5' 9.5" (1.765 m)   Wt 91.2 kg   SpO2 97%   BMI 29.26 kg/m  Alert and oriented x 4. CBG dropped to 50's last evening. Glucose control medications were reduced drastically yesterday. Will make no changes today, and observe his pattern.  Moving all extremities well OR tomorrow

## 2020-06-05 ENCOUNTER — Inpatient Hospital Stay (HOSPITAL_COMMUNITY): Payer: Medicare HMO | Admitting: Anesthesiology

## 2020-06-05 ENCOUNTER — Encounter (HOSPITAL_COMMUNITY): Payer: Self-pay | Admitting: Neurosurgery

## 2020-06-05 ENCOUNTER — Encounter (HOSPITAL_COMMUNITY)
Admission: EM | Disposition: A | Discharge status: Home Health | Payer: Self-pay | Source: Home / Self Care | Attending: Neurosurgery

## 2020-06-05 HISTORY — PX: LUMBAR WOUND DEBRIDEMENT: SHX1988

## 2020-06-05 HISTORY — PX: APPLICATION OF WOUND VAC: SHX5189

## 2020-06-05 LAB — GLUCOSE, CAPILLARY
Glucose-Capillary: 133 mg/dL — ABNORMAL HIGH (ref 70–99)
Glucose-Capillary: 153 mg/dL — ABNORMAL HIGH (ref 70–99)
Glucose-Capillary: 138 mg/dL — ABNORMAL HIGH (ref 70–99)
Glucose-Capillary: 90 mg/dL (ref 70–99)
Glucose-Capillary: 104 mg/dL — ABNORMAL HIGH (ref 70–99)
Glucose-Capillary: 165 mg/dL — ABNORMAL HIGH (ref 70–99)
Glucose-Capillary: 380 mg/dL — ABNORMAL HIGH (ref 70–99)

## 2020-06-05 LAB — BASIC METABOLIC PANEL
Anion gap: 5 (ref 5–15)
BUN: 12 mg/dL (ref 8–23)
CO2: 21 mmol/L — ABNORMAL LOW (ref 22–32)
Calcium: 7.8 mg/dL — ABNORMAL LOW (ref 8.9–10.3)
Chloride: 110 mmol/L (ref 98–111)
Creatinine, Ser: 1.27 mg/dL — ABNORMAL HIGH (ref 0.61–1.24)
GFR, Estimated: 58 mL/min — ABNORMAL LOW (ref >60)
Glucose, Bld: 142 mg/dL — ABNORMAL HIGH (ref 70–99)
Potassium: 3.8 mmol/L (ref 3.5–5.1)
Sodium: 136 mmol/L (ref 135–145)

## 2020-06-05 SURGERY — LUMBAR WOUND DEBRIDEMENT
Anesthesia: General

## 2020-06-05 MED ORDER — EPHEDRINE SULFATE-NACL 50-0.9 MG/10ML-% IV SOSY
PREFILLED_SYRINGE | INTRAVENOUS | Status: DC | PRN
  Administered 2020-06-05 (×3): 10 mg via INTRAVENOUS

## 2020-06-05 MED ORDER — PROPOFOL 10 MG/ML IV BOLUS
INTRAVENOUS | Status: DC | PRN
  Administered 2020-06-05: 100 mg via INTRAVENOUS

## 2020-06-05 MED ORDER — LACTATED RINGERS IV SOLN: INTRAVENOUS | Status: DC

## 2020-06-05 MED ORDER — ACETAMINOPHEN 500 MG PO TABS
1000.0000 mg | ORAL_TABLET | ORAL | Status: AC
  Administered 2020-06-05: 1000 mg via ORAL

## 2020-06-05 MED ORDER — FENTANYL CITRATE (PF) 250 MCG/5ML IJ SOLN
INTRAMUSCULAR | Status: DC | PRN
  Administered 2020-06-05 (×3): 50 ug via INTRAVENOUS

## 2020-06-05 MED ORDER — THROMBIN 5000 UNITS EX SOLR
CUTANEOUS | Status: AC
  Filled 2020-06-05: qty 10000

## 2020-06-05 MED ORDER — FENTANYL CITRATE (PF) 250 MCG/5ML IJ SOLN
INTRAMUSCULAR | Status: AC
  Filled 2020-06-05: qty 5

## 2020-06-05 MED ORDER — PROPOFOL 10 MG/ML IV BOLUS
INTRAVENOUS | Status: AC
  Filled 2020-06-05: qty 20

## 2020-06-05 MED ORDER — HYDROMORPHONE HCL 1 MG/ML IJ SOLN: 0.2500 mg | INTRAMUSCULAR | Status: DC | PRN

## 2020-06-05 MED ORDER — ROCURONIUM BROMIDE 10 MG/ML (PF) SYRINGE
PREFILLED_SYRINGE | INTRAVENOUS | Status: DC | PRN
  Administered 2020-06-05: 50 mg via INTRAVENOUS

## 2020-06-05 MED ORDER — LIDOCAINE-EPINEPHRINE 1 %-1:100000 IJ SOLN
INTRAMUSCULAR | Status: AC
  Filled 2020-06-05: qty 1

## 2020-06-05 MED ORDER — 0.9 % SODIUM CHLORIDE (POUR BTL) OPTIME
TOPICAL | Status: DC | PRN
  Administered 2020-06-05: 1000 mL

## 2020-06-05 MED ORDER — ROCURONIUM BROMIDE 10 MG/ML (PF) SYRINGE
PREFILLED_SYRINGE | INTRAVENOUS | Status: AC
  Filled 2020-06-05: qty 10

## 2020-06-05 MED ORDER — ONDANSETRON HCL 4 MG/2ML IJ SOLN
INTRAMUSCULAR | Status: AC
  Filled 2020-06-05: qty 2

## 2020-06-05 MED ORDER — ORAL CARE MOUTH RINSE: 15.0000 mL | Freq: Once | OROMUCOSAL | Status: AC

## 2020-06-05 MED ORDER — ONDANSETRON HCL 4 MG/2ML IJ SOLN: 4.0000 mg | Freq: Once | INTRAMUSCULAR | Status: DC | PRN

## 2020-06-05 MED ORDER — SODIUM CHLORIDE 0.9 % IV SOLN
2.0000 g | Freq: Two times a day (BID) | INTRAVENOUS | Status: DC
  Administered 2020-06-05 – 2020-06-09 (×8): 2 g via INTRAVENOUS
  Filled 2020-06-05 (×9): qty 2

## 2020-06-05 MED ORDER — EPHEDRINE 5 MG/ML INJ
INTRAVENOUS | Status: AC
  Filled 2020-06-05: qty 10

## 2020-06-05 MED ORDER — BUPIVACAINE-EPINEPHRINE (PF) 0.25% -1:200000 IJ SOLN
INTRAMUSCULAR | Status: AC
  Filled 2020-06-05: qty 30

## 2020-06-05 MED ORDER — DEXAMETHASONE SODIUM PHOSPHATE 10 MG/ML IJ SOLN
INTRAMUSCULAR | Status: DC | PRN
  Administered 2020-06-05: 5 mg via INTRAVENOUS

## 2020-06-05 MED ORDER — ONDANSETRON HCL 4 MG/2ML IJ SOLN
INTRAMUSCULAR | Status: DC | PRN
  Administered 2020-06-05: 4 mg via INTRAVENOUS

## 2020-06-05 MED ORDER — CHLORHEXIDINE GLUCONATE 0.12 % MT SOLN
15.0000 mL | Freq: Once | OROMUCOSAL | Status: AC
  Administered 2020-06-05: 15 mL via OROMUCOSAL
  Filled 2020-06-05: qty 15

## 2020-06-05 MED ORDER — BACITRACIN ZINC 500 UNIT/GM EX OINT
TOPICAL_OINTMENT | CUTANEOUS | Status: AC
  Filled 2020-06-05: qty 28.35

## 2020-06-05 MED ORDER — DEXAMETHASONE SODIUM PHOSPHATE 10 MG/ML IJ SOLN
INTRAMUSCULAR | Status: AC
  Filled 2020-06-05: qty 1

## 2020-06-05 MED ORDER — PHENYLEPHRINE HCL-NACL 10-0.9 MG/250ML-% IV SOLN
INTRAVENOUS | Status: DC | PRN
  Administered 2020-06-05: 40 ug/min via INTRAVENOUS

## 2020-06-05 MED ORDER — CHLORHEXIDINE GLUCONATE 0.12 % MT SOLN: 15.0000 mL | Freq: Once | OROMUCOSAL | Status: DC

## 2020-06-05 MED ORDER — ACETAMINOPHEN 500 MG PO TABS
ORAL_TABLET | ORAL | Status: AC
  Filled 2020-06-05: qty 2

## 2020-06-05 MED ORDER — VANCOMYCIN HCL 1000 MG IV SOLR
INTRAVENOUS | Status: AC
  Filled 2020-06-05: qty 1000

## 2020-06-05 MED ORDER — LIDOCAINE 2% (20 MG/ML) 5 ML SYRINGE
INTRAMUSCULAR | Status: DC | PRN
  Administered 2020-06-05: 80 mg via INTRAVENOUS

## 2020-06-05 MED ORDER — LIDOCAINE 2% (20 MG/ML) 5 ML SYRINGE
INTRAMUSCULAR | Status: AC
  Filled 2020-06-05: qty 5

## 2020-06-05 MED ORDER — ORAL CARE MOUTH RINSE: 15.0000 mL | Freq: Once | OROMUCOSAL | Status: DC

## 2020-06-05 MED ORDER — ACETAMINOPHEN 10 MG/ML IV SOLN: 1000.0000 mg | Freq: Once | INTRAVENOUS | Status: DC | PRN

## 2020-06-05 MED ORDER — SUGAMMADEX SODIUM 200 MG/2ML IV SOLN
INTRAVENOUS | Status: DC | PRN
  Administered 2020-06-05: 200 mg via INTRAVENOUS

## 2020-06-05 SURGICAL SUPPLY — 45 items
BENZOIN TINCTURE PRP APPL 2/3 (GAUZE/BANDAGES/DRESSINGS) ×4 IMPLANT
BLADE CLIPPER SURG (BLADE) IMPLANT
CANISTER SUCT 3000ML PPV (MISCELLANEOUS) ×4 IMPLANT
CANISTER WOUNDNEG PRESSURE 500 (CANNISTER) ×4 IMPLANT
CARTRIDGE OIL MAESTRO DRILL (MISCELLANEOUS) ×2 IMPLANT
CLOSURE WOUND 1/2 X4 (GAUZE/BANDAGES/DRESSINGS) ×1
COVER WAND RF STERILE (DRAPES) ×4 IMPLANT
DIFFUSER DRILL AIR PNEUMATIC (MISCELLANEOUS) ×4 IMPLANT
DRAPE INCISE IOBAN 66X45 STRL (DRAPES) ×4 IMPLANT
DRAPE LAPAROTOMY 100X72X124 (DRAPES) ×4 IMPLANT
DRAPE SURG 17X23 STRL (DRAPES) ×16 IMPLANT
DRSG VAC ATS SM SENSATRAC (GAUZE/BANDAGES/DRESSINGS) ×4 IMPLANT
ELECT REM PT RETURN 9FT ADLT (ELECTROSURGICAL) ×4
ELECTRODE REM PT RTRN 9FT ADLT (ELECTROSURGICAL) ×2 IMPLANT
GAUZE 4X4 16PLY RFD (DISPOSABLE) IMPLANT
GAUZE SPONGE 4X4 12PLY STRL (GAUZE/BANDAGES/DRESSINGS) ×4 IMPLANT
GLOVE BIO SURGEON STRL SZ8 (GLOVE) ×4 IMPLANT
GLOVE BIO SURGEON STRL SZ8.5 (GLOVE) ×4 IMPLANT
GLOVE EXAM NITRILE XL STR (GLOVE) IMPLANT
GLOVE SURG UNDER POLY LF SZ6.5 (GLOVE) ×4 IMPLANT
GOWN STRL REUS W/ TWL LRG LVL3 (GOWN DISPOSABLE) IMPLANT
GOWN STRL REUS W/ TWL XL LVL3 (GOWN DISPOSABLE) IMPLANT
GOWN STRL REUS W/TWL LRG LVL3 (GOWN DISPOSABLE)
GOWN STRL REUS W/TWL XL LVL3 (GOWN DISPOSABLE)
KIT BASIN OR (CUSTOM PROCEDURE TRAY) ×4 IMPLANT
KIT TURNOVER KIT B (KITS) ×4 IMPLANT
NEEDLE HYPO 22GX1.5 SAFETY (NEEDLE) IMPLANT
NS IRRIG 1000ML POUR BTL (IV SOLUTION) ×4 IMPLANT
OIL CARTRIDGE MAESTRO DRILL (MISCELLANEOUS) ×4
PACK LAMINECTOMY NEURO (CUSTOM PROCEDURE TRAY) ×4 IMPLANT
PAD ARMBOARD 7.5X6 YLW CONV (MISCELLANEOUS) ×12 IMPLANT
PAD NEG PRESSURE SENSATRAC (MISCELLANEOUS) IMPLANT
SPONGE SURGIFOAM ABS GEL SZ50 (HEMOSTASIS) ×4 IMPLANT
STRIP CLOSURE SKIN 1/2X4 (GAUZE/BANDAGES/DRESSINGS) ×3 IMPLANT
SUT ETHILON 2 0 PSLX (SUTURE) IMPLANT
SUT VIC AB 1 CT1 18XBRD ANBCTR (SUTURE) ×2 IMPLANT
SUT VIC AB 1 CT1 8-18 (SUTURE) ×4
SUT VIC AB 2-0 CP2 18 (SUTURE) ×4 IMPLANT
SWAB COLLECTION DEVICE MRSA (MISCELLANEOUS) IMPLANT
SWAB CULTURE ESWAB REG 1ML (MISCELLANEOUS) IMPLANT
TOWEL GREEN STERILE (TOWEL DISPOSABLE) ×4 IMPLANT
TOWEL GREEN STERILE FF (TOWEL DISPOSABLE) ×4 IMPLANT
TUBE CONNECTING 12'X1/4 (SUCTIONS) ×1
TUBE CONNECTING 12X1/4 (SUCTIONS) ×3 IMPLANT
WATER STERILE IRR 1000ML POUR (IV SOLUTION) ×4 IMPLANT

## 2020-06-05 NOTE — Transfer of Care (Signed)
Immediate Anesthesia Transfer of Care Note  Patient: Jeffery Fernandez  Procedure(s) Performed: LUMBAR WOUND DEBRIDEMENT (N/A ) APPLICATION OF WOUND VAC  Patient Location: PACU  Anesthesia Type:General  Level of Consciousness: awake and alert   Airway & Oxygen Therapy: Patient Spontanous Breathing and Patient connected to face mask oxygen  Post-op Assessment: Report given to RN and Post -op Vital signs reviewed and stable  Post vital signs: Reviewed and stable  Last Vitals:  Vitals Value Taken Time  BP 155/67 06/05/20 1649  Temp    Pulse 63 06/05/20 1651  Resp 18 06/05/20 1651  SpO2 100 % 06/05/20 1651  Vitals shown include unvalidated device data.  Last Pain:  Vitals:   06/05/20 1503  TempSrc:   PainSc: 0-No pain      Patients Stated Pain Goal: 2 (18/29/93 7169)  Complications: No complications documented.

## 2020-06-05 NOTE — Anesthesia Procedure Notes (Signed)
Procedure Name: Intubation Date/Time: 06/05/2020 3:38 PM Performed by: Reece Agar, CRNA Pre-anesthesia Checklist: Patient identified, Emergency Drugs available, Suction available and Patient being monitored Patient Re-evaluated:Patient Re-evaluated prior to induction Oxygen Delivery Method: Circle System Utilized Preoxygenation: Pre-oxygenation with 100% oxygen Induction Type: IV induction Ventilation: Mask ventilation without difficulty Laryngoscope Size: Mac and 4 Grade View: Grade I Tube type: Oral Tube size: 7.5 mm Number of attempts: 1 Airway Equipment and Method: Stylet and Oral airway Placement Confirmation: ETT inserted through vocal cords under direct vision,  positive ETCO2 and breath sounds checked- equal and bilateral Secured at: 22 cm Tube secured with: Tape Dental Injury: Teeth and Oropharynx as per pre-operative assessment

## 2020-06-05 NOTE — Anesthesia Preprocedure Evaluation (Addendum)
Anesthesia Evaluation  Patient identified by MRN, date of birth, ID band Patient awake    Reviewed: Allergy & Precautions, NPO status , Patient's Chart, lab work & pertinent test results  Airway Mallampati: II  TM Distance: >3 FB Neck ROM: Full    Dental  (+) Edentulous Upper, Poor Dentition, Missing   Pulmonary sleep apnea , former smoker,    Pulmonary exam normal breath sounds clear to auscultation       Cardiovascular hypertension, Normal cardiovascular exam Rhythm:Regular Rate:Normal     Neuro/Psych negative neurological ROS  negative psych ROS   GI/Hepatic negative GI ROS, Neg liver ROS,   Endo/Other  diabetes  Renal/GU Renal InsufficiencyRenal disease  negative genitourinary   Musculoskeletal negative musculoskeletal ROS (+)   Abdominal   Peds negative pediatric ROS (+)  Hematology  (+) anemia ,   Anesthesia Other Findings   Reproductive/Obstetrics negative OB ROS                            Anesthesia Physical Anesthesia Plan  ASA: III  Anesthesia Plan: General   Post-op Pain Management:    Induction: Intravenous  PONV Risk Score and Plan: 2 and Ondansetron, Dexamethasone and Treatment may vary due to age or medical condition  Airway Management Planned: Oral ETT  Additional Equipment:   Intra-op Plan:   Post-operative Plan: Extubation in OR  Informed Consent: I have reviewed the patients History and Physical, chart, labs and discussed the procedure including the risks, benefits and alternatives for the proposed anesthesia with the patient or authorized representative who has indicated his/her understanding and acceptance.     Dental advisory given  Plan Discussed with: CRNA and Surgeon  Anesthesia Plan Comments:         Anesthesia Quick Evaluation

## 2020-06-05 NOTE — Progress Notes (Signed)
Physical Therapy Treatment Patient Details Name: Jeffery Fernandez MRN: [id number] DOB: [birth year] Today's Date: 06/05/2020    History of Present Illness Pt is a 75 y.o. M admitted with decreased appetite nausea, diarrhea, generalized weakness and seosanguineous drainage from his surgical wound. Significant PMH: uncontrolled diabetes, CKD, HTN, L BBB, L3 kyphoplasty, recent L4-5 decompression and fusion 05/11/2020. Discharged to [facility] 05/19/2020 and returned home 05/23/2020.    PT Comments    The pt was agreeable to session despite nausea, and was able to demo good independence with mobility and initial transfers at this time. The pt continues to benefit from minG and intermittent verbal cues for spinal precautions and application to movements. The pt was then able to complete 29ft hallway ambulation with RW, but required standing rest breaks due to exertional fatigue, and increased standing rest breaks on return to room (additional 75 ft broken up into 3 bouts of walking) due to fatigue. Pt given cues for self-monitoring of exertion and guided breathing. No LOB this session, but minG for safety. The pt will continue to benefit from skilled PT to progress functional independence, dynamic stability, and activity endurance, but will likely be safe to return home with family assist when medically cleared.     Follow Up Recommendations  Home health PT;Supervision/Assistance - 24 hour     Equipment Recommendations  None recommended by PT    Recommendations for Other Services       Precautions / Restrictions Precautions Precautions: Back;Fall Precaution Booklet Issued: Yes (comment) Precaution Comments: pt able to recall precautions, apply to bed mobility Required Braces or Orthoses: Spinal Brace (for comfort) Spinal Brace: Lumbar corset;Applied in sitting position Spinal Brace Comments: pt does not have to wear the spinal brace. pt reports it is loose since recent wt loss. able to adjust to be  supportive this session Restrictions Weight Bearing Restrictions: No    Mobility  Bed Mobility Overal bed mobility: Needs Assistance Bed Mobility: Rolling;Sidelying to Sit;Sit to Sidelying Rolling: Supervision Sidelying to sit: Supervision     Sit to sidelying: Min guard General bed mobility comments: cues for log roll, intermittent cues to avoid twisting    Transfers Overall transfer level: Needs assistance Equipment used: Rolling walker (2 wheeled) Transfers: Sit to/from Stand Sit to Stand: Min guard         General transfer comment: MinguardA for mobility with RW and cues for safety  Ambulation/Gait Ambulation/Gait assistance: Min guard Gait Distance (Feet): 75 Feet (+ 35 ft + 35 ft) Assistive device: Rolling walker (2 wheeled) Gait Pattern/deviations: Step-through pattern;Decreased stride length Gait velocity: 0.28 m/s Gait velocity interpretation: <1.31 ft/sec, indicative of household ambulator General Gait Details: slow but steady, no overt LOB. Pt able to walk 75 ft with cues for posture and positioning in RW, but then needing increased standing rest breaks due to fatigue       Balance Overall balance assessment: Needs assistance Sitting-balance support: Feet supported;No upper extremity supported Sitting balance-Leahy Scale: Good     Standing balance support: Bilateral upper extremity supported Standing balance-Leahy Scale: Poor Standing balance comment: able to static stand without UE support, BUE support for gait                            Cognition Arousal/Alertness: Awake/alert Behavior During Therapy: WFL for tasks assessed/performed Overall Cognitive Status: Impaired/Different from baseline Area of Impairment: Memory;Following commands  Memory: Decreased short-term memory Following Commands: Follows one step commands with increased time       General Comments: pt with difficulty at times applying  precautions to movement. needing cues for self-monitoring of exertion      Exercises      General Comments General comments (skin integrity, edema, etc.): pt reports nausea, but also states he was nauseous at home prior to admission, nausea is unchanged with medicine or changes in position      Pertinent Vitals/Pain Pain Assessment: Faces Faces Pain Scale: No hurt Pain Intervention(s): Monitored during session           PT Goals (current goals can now be found in the care plan section) Acute Rehab PT Goals Patient Stated Goal: improve appetite, oral intake PT Goal Formulation: With patient/family Time For Goal Achievement: 06/14/20 Potential to Achieve Goals: Good Progress towards PT goals: Progressing toward goals    Frequency    Min 3X/week      PT Plan Current plan remains appropriate       AM-PAC PT "6 Clicks" Mobility   Outcome Measure  Help needed turning from your back to your side while in a flat bed without using bedrails?: A Little Help needed moving from lying on your back to sitting on the side of a flat bed without using bedrails?: A Little Help needed moving to and from a bed to a chair (including a wheelchair)?: A Little Help needed standing up from a chair using your arms (e.g., wheelchair or bedside chair)?: A Little Help needed to walk in hospital room?: A Little Help needed climbing 3-5 steps with a railing? : A Little 6 Click Score: 18    End of Session Equipment Utilized During Treatment: Gait belt;Back brace Activity Tolerance: Patient tolerated treatment well;No increased pain Patient left: in bed;with bed alarm set;with call bell/phone within reach;with nursing/sitter in room Nurse Communication: Mobility status PT Visit Diagnosis: Unsteadiness on feet (R26.81);Muscle weakness (generalized) (M62.81);History of falling (Z91.81)     Time: 6629-4765 PT Time Calculation (min) (ACUTE ONLY): 27 min  Charges:  $Gait Training: 23-37  mins                     Karma Ganja, PT, DPT   Acute Rehabilitation Department Pager #: [phone]   Otho Bellows 06/05/2020, 1:52 PM

## 2020-06-05 NOTE — Care Management Important Message (Signed)
Important Message  Patient Details  Name: Jeffery Fernandez MRN: [id number] Date of Birth: 12-16-1945   Medicare Important Message Given:  Yes     Arshan Jabs Montine Circle 06/05/2020, 3:28 PM

## 2020-06-05 NOTE — Progress Notes (Signed)
Subjective: The patient is alert and pleasant.  His wife is at the bedside.  Objective: Vital signs in last 24 hours: Temp:  [97.7 F (36.5 C)-98.5 F (36.9 C)] 97.9 F (36.6 C) (05/09 0824) Pulse Rate:  [68-74] 74 (05/09 0824) Resp:  [15-21] 19 (05/09 0824) BP: (142-156)/(67-79) 156/79 (05/09 0824) SpO2:  [92 %-95 %] 95 % (05/09 0824) Estimated body mass index is 29.26 kg/m as calculated from the following:   Height as of this encounter: 5' 9.5" (1.765 m).   Weight as of this encounter: 91.2 kg.   Intake/Output from previous day: 05/08 0701 - 05/09 0700 In: 355.7 [I.V.:355.7] Out: 400 [Urine:400] Intake/Output this shift: Total I/O In: -  Out: 100 [Urine:100]  Physical exam the patient is alert and pleasant.  His strength is grossly normal.  The patient's wound is without change.  Has a superficial dehiscence and some purulent drainage on the bandage.  Lab Results: No results for input(s): WBC, HGB, HCT, PLT in the last 72 hours. BMET Recent Labs    06/03/20 0208 06/05/20 0420  NA 141 136  K 4.0 3.8  CL 114* 110  CO2 22 21*  GLUCOSE 91 142*  BUN 24* 12  CREATININE 1.50* 1.27*  CALCIUM 8.1* 7.8*    Studies/Results: No results found.  Assessment/Plan: Wound infection: I have again recommended an incision and drainage of the wound plus placement of a wound VAC.  I have answered all her questions.  The patient has decided proceed with surgery.  LOS: 6 days     Ophelia Charter 06/05/2020, 12:01 PM

## 2020-06-05 NOTE — Op Note (Signed)
Brief history: The patient is a 75 year old white male on whom I performed a L4-5 decompression, instrumentation and fusion on 05/11/2020.  The patient was slow to progress after surgery and was discharged to a skilled nursing facility.  Follow-up in the office with some wound drainage.  We started him on empiric Keflex and then doxycycline.  His wound drainage continued and he was readmitted with mental status changes.  He was started on empiric vancomycin.  His wound drainage persisted and I recommended an incision and drainage of his wound.  He has decided proceed with surgery after weighing the risk, benefits and alternatives.  Preop diagnosis: Wound drainage, wound infection, wound dehiscence  Postop diagnosis: The same  Procedure: Incision and drainage of wound, wound cultures, placement of wound VAC  Surgeon: Dr. Earle Gell  Assistant: Arnetha Massy, NP  Anesthesia: General tracheal  Estimated blood loss: Minimal  Specimens: Cultures  Drains: Wound VAC  Complications: None  Description of procedure: The patient was brought to the operating room by the anesthesia team.  General endotracheal anesthesia was induced.  He was carefully turned to the prone position on the Wilson frame.  His lumbosacral region was prepared with Betadine scrub and Betadine solution.  Sterile drapes were applied.  I then used a scalpel to incise through the patient's fresh surgical scar.  We used the Geologist, engineering for exposure.  We encountered some thin liquid and exudate in the subcutaneous space which appeared to be fat necrosis.  We obtain cultures.  We irrigated the wound out with saline solution.  The purulent material did not seem to go below the muscle/ fascia.  I debrided the exit off the tissue with the Metzenbaum scissors.  We obtained hemostasis with electrocautery.  We then placed a wound VAC in the wound and did not approximate the patient's subcutaneous tissue.  By report all sponge,  instrument and needle counts were correct at the end of this case.

## 2020-06-05 NOTE — Progress Notes (Signed)
Pharmacy Antibiotic Note  Jeffery Fernandez is a 75 y.o. male admitted on 05/30/2020 from a SNF with a wound infection.  Pharmacy was consulted for vancomycin on 5/4 (pt rec'd vancomycin from 5/4-5/9; vancomycin now discontinued). Pharmacy now has been consulted for cefepime dosing for wound infection.  WBC 10.2, afebrile; Scr 1.27, CrCl 54.8 ml/min (Scr continuing to improve)  Plan: Cefepime 2 gm IV Q 12 hrs Monitor WBC, temp, clinical improvement, cultures, renal function  Height: 5' 9.5" (176.5 cm) Weight: 91.2 kg (201 lb) IBW/kg (Calculated) : 71.85  Temp (24hrs), Avg:98 F (36.7 C), Min:97.7 F (36.5 C), Max:98.3 F (36.8 C)  Recent Labs  Lab 05/30/20 0837 06/01/20 1217 06/03/20 0208 06/05/20 0420  WBC 8.5  --   --   --   CREATININE 3.09* 1.85* 1.50* 1.27*  LATICACIDVEN 1.3  --   --   --     Estimated Creatinine Clearance: 54.8 mL/min (A) (by C-G formula based on SCr of 1.27 mg/dL (H)).    Allergies  Allergen Reactions  . Penicillins Other (See Comments)    Possibly rash - not sure Has patient had a PCN reaction causing immediate rash, facial/tongue/throat swelling, SOB or lightheadedness with hypotension:unknown Has patient had a PCN reaction causing severe rash involving mucus membranes or skin necrosis: Unknown Has patient had a PCN reaction that required hospitalization: Unknown Has patient had a PCN reaction occurring within the last 10 years: No Childhood reaction If all of the above answers are "NO", then may proceed with Cephalosporin use.     Antimicrobials this admission: Vancomycin:  5/4-5/9 Cefepime:  5/9 >>  Microbiology results: 5/3 BCx X 2: NG/final 5/3 UCx: NG/final 5/3 COVID, flu A, flu B: negative 5/3 C diff: negative  Thank you for allowing pharmacy to be a part of this patient's care.  Gillermina Hu, PharmD, BCPS, [facility] Clinical Pharmacist 06/05/2020 4:46 PM

## 2020-06-06 ENCOUNTER — Encounter (HOSPITAL_COMMUNITY): Payer: Self-pay | Admitting: Neurosurgery

## 2020-06-06 ENCOUNTER — Inpatient Hospital Stay: Payer: Self-pay

## 2020-06-06 DIAGNOSIS — T8189XA Other complications of procedures, not elsewhere classified, initial encounter: Secondary | ICD-10-CM

## 2020-06-06 DIAGNOSIS — T8149XA Infection following a procedure, other surgical site, initial encounter: Secondary | ICD-10-CM

## 2020-06-06 LAB — GLUCOSE, CAPILLARY
Glucose-Capillary: 136 mg/dL — ABNORMAL HIGH (ref 70–99)
Glucose-Capillary: 139 mg/dL — ABNORMAL HIGH (ref 70–99)
Glucose-Capillary: 199 mg/dL — ABNORMAL HIGH (ref 70–99)
Glucose-Capillary: 207 mg/dL — ABNORMAL HIGH (ref 70–99)
Glucose-Capillary: 276 mg/dL — ABNORMAL HIGH (ref 70–99)
Glucose-Capillary: 297 mg/dL — ABNORMAL HIGH (ref 70–99)

## 2020-06-06 MED ORDER — VANCOMYCIN HCL 1500 MG/300ML IV SOLN
1500.0000 mg | INTRAVENOUS | Status: DC
  Administered 2020-06-06: 1500 mg via INTRAVENOUS
  Filled 2020-06-06 (×2): qty 300

## 2020-06-06 NOTE — Progress Notes (Signed)
Subjective: The patient is alert and pleasant.  He looks and feels better.  He wants to go home.  Objective: Vital signs in last 24 hours: Temp:  [97.3 F (36.3 C)-98.5 F (36.9 C)] 97.5 F (36.4 C) (05/10 0725) Pulse Rate:  [57-74] 68 (05/10 0725) Resp:  [14-19] 17 (05/10 0725) BP: (134-171)/(64-84) 171/77 (05/10 0725) SpO2:  [92 %-100 %] 94 % (05/10 0725) Estimated body mass index is 29.26 kg/m as calculated from the following:   Height as of this encounter: 5' 9.5" (1.765 m).   Weight as of this encounter: 91.2 kg.   Intake/Output from previous day: 05/09 0701 - 05/10 0700 In: 4018.6 [P.O.:237; I.V.:3681.6; IV Piggyback:100] Out: 2992 [Urine:1350; Drains:50; Stool:1; Blood:20] Intake/Output this shift: Total I/O In: -  Out: 375 [Urine:375]  Physical exam the patient is alert and oriented.  His strength is normal.  His wound VAC is in place and functioning well.  Lab Results: No results for input(s): WBC, HGB, HCT, PLT in the last 72 hours. BMET Recent Labs    06/05/20 0420  NA 136  K 3.8  CL 110  CO2 21*  GLUCOSE 142*  BUN 12  CREATININE 1.27*  CALCIUM 7.8*    Studies/Results: No results found.  Assessment/Plan: Postop day #1: The Gram stain is negative.  We will await the culture results.  I will ask ID to help [place] with this complicated case regarding antibiotic choice and length of therapy.  Hopefully we can get him home in a day or 2.  LOS: 7 days     Ophelia Charter 06/06/2020, 7:49 AM

## 2020-06-06 NOTE — Anesthesia Postprocedure Evaluation (Signed)
Anesthesia Post Note  Patient: Jeffery Fernandez  Procedure(s) Performed: LUMBAR WOUND DEBRIDEMENT (N/A ) APPLICATION OF WOUND VAC     Patient location during evaluation: PACU Anesthesia Type: General Level of consciousness: awake and alert Pain management: pain level controlled Vital Signs Assessment: post-procedure vital signs reviewed and stable Respiratory status: spontaneous breathing, nonlabored ventilation, respiratory function stable and patient connected to nasal cannula oxygen Cardiovascular status: blood pressure returned to baseline and stable Postop Assessment: no apparent nausea or vomiting Anesthetic complications: no   No complications documented.  Last Vitals:  Vitals:   06/06/20 0328 06/06/20 0725  BP: 139/73 (!) 171/77  Pulse: 61 68  Resp: 19 17  Temp: (!) 36.4 C (!) 36.4 C  SpO2: 97% 94%    Last Pain:  Vitals:   06/06/20 0725  TempSrc: Oral  PainSc: 0-No pain                 Catalina Gravel

## 2020-06-06 NOTE — Consult Note (Signed)
Jeffery Fernandez for Infectious Disease    Date of Admission:  05/30/2020     Total days of antibiotics 7               Reason for Consult: Postoperative Wound Infection  Referring Provider: Reinaldo Meeker, NP Primary Care Provider: Lilian Coma., MD   ASSESSMENT:  Jeffery Fernandez is a 75 y/o caucasian male s/p L3/L4 decompression with hardware placement admitted with postoperative wound infection and s/p I&D with wound VAC placement. Surgical specimens with no growth on gram stain and cultures pending. He has been on vancomycin the past several days which may reduce chances of culture growth. Will continue on broad spectrum coverage with vancomycin and cefepime. Continue to monitor vancomycin levels and renal function while on vancomycin. PICC line placement. Wound care per Neurosurgery. Most recent A1c of 9.3 which may complicate healing and increase risk for infection.   PLAN:  1. Continue with vancomycin and cefepime. 2. Monitor renal function and vancomycin levels per protocol. 3. PICC line placement.  4. Monitor surgical specimens for culture growth. 5. Wound care per Neurosurgery. 6. Optimize nutrition and blood sugar control.    Principal Problem:   Postoperative wound infection Active Problems:   Delayed surgical wound healing   . amLODipine  5 mg Oral Daily  . atorvastatin  20 mg Oral QHS  . carvedilol  25 mg Oral BID WC  . docusate sodium  100 mg Oral BID  . feeding supplement  237 mL Oral TID BM  . hydrochlorothiazide  12.5 mg Oral BID  . insulin glargine  30 Units Subcutaneous Daily  . lisinopril  40 mg Oral Daily  . sertraline  100 mg Oral Daily     HPI: Jeffery Fernandez is a 75 y.o. male poorly controlled diabetes, hypertension, and chronic kidney disease (baseline 1.4-1.6) admitted with drainage from his surgical wound.   Jeffery Fernandez initially sustained a compression fracture following a fall on 04/14/20 and underwent L4-L5 decompression with instrumentation  placement on 05/11/20. Transferred to a skilled care facility on 4/22 and left on 4/26 as he and family were unsatisfied with the care. Noted new onset drainage from his wound when he was discharged home. Dressing changes were initiated and started on doxycycline on 4/28. He stopped taking the medication from 05/28/20. Started on vancomycin on 5/4 and currently on day 7 of antibiotics. Brought to the OR on 5/9 for I&D and placement of wound VAC. Encountered thin liquid and exudate. Drainage did not seem to go below the muscle/fascia.   Jeffery Fernandez has been afebrile since admission. Surgical specimens with no organisms on gram stain and cultures currently too young to read. Antibiotics now with vancomycin and cefepime. ID consulted for antibiotic recommendations.    Review of Systems: Review of Systems  Constitutional: Negative for chills, fever and weight loss.  Respiratory: Negative for cough, shortness of breath and wheezing.   Cardiovascular: Negative for chest pain and leg swelling.  Gastrointestinal: Negative for abdominal pain, constipation, diarrhea, nausea and vomiting.  Skin: Negative for rash.     Past Medical History:  Diagnosis Date  . Arrhythmia   . Arthritis   . Cataract    bilateral cataract extraction with intraoccular lens implants  . Chest pain, unspecified   . Chronic airway obstruction, not elsewhere classified    PT. DENIES HE HAS COPD  . CKD (chronic kidney disease)   . Colon polyps    Adenomatous Polyps 2007  . Depression   .  Diabetes mellitus   . Diverticulosis   . Gout   . Heart murmur   . History of kidney stones   . Hyperlipemia   . Hypertension   . Nephrolithiasis   . Normocytic anemia 04/23/2017  . Obstructive sleep apnea (adult) (pediatric)   . Other and unspecified hyperlipidemia   . Other left bundle branch block   . Retinal vascular occlusion, unspecified    right  . Shortness of breath    on exertion  . Unspecified sleep apnea   . Vision loss  of right eye   . White coat syndrome with diagnosis of hypertension 09/19/2017    Social History   Tobacco Use  . Smoking status: Former Smoker    Packs/day: 1.50    Years: 25.00    Pack years: 37.50    Types: Cigarettes    Quit date: 01/28/1986    Years since quitting: 34.3  . Smokeless tobacco: Never Used  Vaping Use  . Vaping Use: Never used  Substance Use Topics  . Alcohol use: Not Currently    Comment: Very rare  . Drug use: No    Family History  Problem Relation Age of Onset  . Heart attack Brother 60       heart attack and CHF/ also some form of EP ablation  . Parkinson's disease Brother   . Atrial fibrillation Brother   . Heart disease Father   . Stroke Father   . Uterine cancer Mother   . Arthritis/Rheumatoid Cousin   . Diabetes Other   . Colon cancer Neg Hx   . Stomach cancer Neg Hx   . Liver cancer Neg Hx   . Rectal cancer Neg Hx   . Esophageal cancer Neg Hx     Allergies  Allergen Reactions  . Penicillins Other (See Comments)    Possibly rash - not sure Has patient had a PCN reaction causing immediate rash, facial/tongue/throat swelling, SOB or lightheadedness with hypotension:unknown Has patient had a PCN reaction causing severe rash involving mucus membranes or skin necrosis: Unknown Has patient had a PCN reaction that required hospitalization: Unknown Has patient had a PCN reaction occurring within the last 10 years: No Childhood reaction If all of the above answers are "NO", then may proceed with Cephalosporin use.     OBJECTIVE: Blood pressure (!) 171/77, pulse 68, temperature (!) 97.5 F (36.4 C), temperature source Oral, resp. rate 17, height 5' 9.5" (1.765 m), weight 91.2 kg, SpO2 94 %.  Physical Exam Constitutional:      General: He is not in acute distress.    Appearance: He is well-developed.  Cardiovascular:     Rate and Rhythm: Normal rate and regular rhythm.     Heart sounds: Normal heart sounds.  Pulmonary:     Effort:  Pulmonary effort is normal.     Breath sounds: Normal breath sounds.  Skin:    General: Skin is warm and dry.  Neurological:     Mental Status: He is alert and oriented to person, place, and time.  Psychiatric:        Behavior: Behavior normal.        Thought Content: Thought content normal.        Judgment: Judgment normal.     Lab Results Lab Results  Component Value Date   WBC 8.5 05/30/2020   HGB 10.2 (L) 05/30/2020   HCT 31.6 (L) 05/30/2020   MCV 90.8 05/30/2020   PLT 274 05/30/2020    Lab Results  Component Value Date   CREATININE 1.27 (H) 06/05/2020   BUN 12 06/05/2020   NA 136 06/05/2020   K 3.8 06/05/2020   CL 110 06/05/2020   CO2 21 (L) 06/05/2020    Lab Results  Component Value Date   ALT 47 (H) 05/30/2020   AST 54 (H) 05/30/2020   ALKPHOS 90 05/30/2020   BILITOT 0.5 05/30/2020     Microbiology: Recent Results (from the past 240 hour(s))  Culture, blood (Routine x 2)     Status: None   Collection Time: 05/30/20  8:38 AM   Specimen: BLOOD LEFT FOREARM  Result Value Ref Range Status   Specimen Description BLOOD LEFT FOREARM  Final   Special Requests   Final    BOTTLES DRAWN AEROBIC AND ANAEROBIC Blood Culture results may not be optimal due to an inadequate volume of blood received in culture bottles   Culture   Final    NO GROWTH 5 DAYS Performed at [facility], [place] [address] [place], [place] [address]    Report Status 06/04/2020 FINAL  Final  Resp Panel by RT-PCR (Flu A&B, Covid) Nasopharyngeal Swab     Status: None   Collection Time: 05/30/20  9:34 AM   Specimen: Nasopharyngeal Swab; Nasopharyngeal(NP) swabs in vial transport medium  Result Value Ref Range Status   SARS Coronavirus 2 by RT PCR NEGATIVE NEGATIVE Final    Comment: (NOTE) SARS-CoV-2 target nucleic acids are NOT DETECTED.  The SARS-CoV-2 RNA is generally detectable in upper respiratory specimens during the acute phase of infection. The lowest concentration of SARS-CoV-2  viral copies this assay can detect is 138 copies/mL. A negative result does not preclude SARS-Cov-2 infection and should not be used as the sole basis for treatment or other patient management decisions. A negative result may occur with  improper specimen collection/handling, submission of specimen other than nasopharyngeal swab, presence of viral mutation(s) within the areas targeted by this assay, and inadequate number of viral copies(<138 copies/mL). A negative result must be combined with clinical observations, patient history, and epidemiological information. The expected result is Negative.  Fact Sheet for Patients:  EntrepreneurPulse.com.au  Fact Sheet for Healthcare Providers:  IncredibleEmployment.be  This test is no t yet approved or cleared by the [place] FDA and  has been authorized for detection and/or diagnosis of SARS-CoV-2 by FDA under an Emergency Use Authorization (EUA). This EUA will remain  in effect (meaning this test can be used) for the duration of the COVID-19 declaration under Section 564(b)(1) of the Act, 21 U.S.C.section 360bbb-3(b)(1), unless the authorization is terminated  or revoked sooner.       Influenza A by PCR NEGATIVE NEGATIVE Final   Influenza B by PCR NEGATIVE NEGATIVE Final    Comment: (NOTE) The Xpert Xpress SARS-CoV-2/FLU/RSV plus assay is intended as an aid in the diagnosis of influenza from Nasopharyngeal swab specimens and should not be used as a sole basis for treatment. Nasal washings and aspirates are unacceptable for Xpert Xpress SARS-CoV-2/FLU/RSV testing.  Fact Sheet for Patients: EntrepreneurPulse.com.au  Fact Sheet for Healthcare Providers: IncredibleEmployment.be  This test is not yet approved or cleared by the [place] FDA and has been authorized for detection and/or diagnosis of SARS-CoV-2 by FDA under an Emergency Use Authorization  (EUA). This EUA will remain in effect (meaning this test can be used) for the duration of the COVID-19 declaration under Section 564(b)(1) of the Act, 21 U.S.C. section 360bbb-3(b)(1), unless the authorization is terminated or revoked.  Performed at [facility], [place] [address] [place], [place] [address]   Culture, blood (Routine x 2)     Status: None   Collection Time: 05/30/20 12:42 PM   Specimen: BLOOD LEFT FOREARM  Result Value Ref Range Status   Specimen Description BLOOD LEFT FOREARM  Final   Special Requests   Final    BOTTLES DRAWN AEROBIC AND ANAEROBIC Blood Culture adequate volume   Culture   Final    NO GROWTH 5 DAYS Performed at [facility], [place] [address] [place], [place] [address]    Report Status 06/04/2020 FINAL  Final  C Difficile Quick Screen w PCR reflex     Status: None   Collection Time: 05/30/20  5:03 PM   Specimen: STOOL  Result Value Ref Range Status   C Diff antigen NEGATIVE NEGATIVE Final   C Diff toxin NEGATIVE NEGATIVE Final   C Diff interpretation No C. difficile detected.  Final    Comment: Performed at [facility], [place] [address] [place], [place] [address]  Culture, Urine     Status: None   Collection Time: 05/30/20 10:13 PM   Specimen: Urine, Catheterized  Result Value Ref Range Status   Specimen Description URINE, CATHETERIZED  Final   Special Requests Doxycycline last dose 05/28/2020  Final   Culture   Final    NO GROWTH Performed at [facility], [place] [address] [place], [place] [address]    Report Status 06/01/2020 FINAL  Final  Aerobic/Anaerobic Culture w Gram Stain (surgical/deep wound)     Status: None (Preliminary result)   Collection Time: 06/05/20  4:24 PM   Specimen: PATH Other; Tissue  Result Value Ref Range Status   Specimen Description WOUND LUMBAR  Final   Special Requests NONE  Final   Gram Stain NO WBC SEEN NO ORGANISMS SEEN   Final   Culture   Final    TOO YOUNG TO READ Performed at [facility], [address], [place], [place] [address]    Report Status PENDING  Incomplete     Terri Piedra, NP [place] [facility]  06/06/2020  11:24 AM

## 2020-06-06 NOTE — Progress Notes (Signed)
Inpatient Diabetes Program Recommendations  AACE/ADA: New Consensus Statement on Inpatient Glycemic Control (2015)  Target Ranges:  Prepandial:   less than 140 mg/dL      Peak postprandial:   less than 180 mg/dL (1-2 hours)      Critically ill patients:  140 - 180 mg/dL   Lab Results  Component Value Date   GLUCAP 207 (H) 06/06/2020   HGBA1C 9.3 (H) 05/11/2020    Review of Glycemic Control Results for Jeffery Fernandez, Jeffery Fernandez (MRN [id number]) as of 06/06/2020 11:28  Ref. Range 06/05/2020 17:43 06/05/2020 19:42 06/05/2020 23:19 06/06/2020 03:27 06/06/2020 07:18  Glucose-Capillary Latest Ref Range: 70 - 99 mg/dL 104 (H) 165 (H) 380 (H) 276 (H) 207 (H)   Diabetes history: DM 2 Outpatient Diabetes medications: Glipizide 10 mg bid, Lantus 40 units Daily Current orders for Inpatient glycemic control:  Lantus 30 units Daily  Ensure Enlive tid between meals  Inpatient Diabetes Program Recommendations:    - Add Novolog 0-9 units tid + hs - May consider diet change to  carb modified  Thanks,  Tama Headings RN, MSN, BC-ADM Inpatient Diabetes Coordinator Team Pager [phone] (8a-5p)

## 2020-06-06 NOTE — Progress Notes (Signed)
Occupational Therapy Treatment Patient Details Name: Jeffery Fernandez MRN: [id number] DOB: April 07, 1945 Today's Date: 06/06/2020    History of present illness Pt is a 75 y.o. M admitted with decreased appetite nausea, diarrhea, generalized weakness and seosanguineous drainage from his surgical wound. Significant PMH: uncontrolled diabetes, CKD, HTN, L BBB, L3 kyphoplasty, recent L4-5 decompression and fusion 05/11/2020. Discharged to [facility] 05/19/2020 and returned home 05/23/2020.   OT comments  Pt exhibiting correct posture and maneuvering with ability to maintain precautions with challenges. Pt standing for grooming and minguardA for mobility with RW in room. Pt with increased good safety awareness. Family encouraging pt in the room. VSS. Next sessions to focus on ADL routine and LB dress. Pt would benefit from continued OT skilled services. OT following acutely.   Follow Up Recommendations  Home health OT;Supervision/Assistance - 24 hour    Equipment Recommendations  None recommended by OT    Recommendations for Other Services      Precautions / Restrictions Precautions Precautions: Back;Fall Precaution Booklet Issued: Yes (comment) Precaution Comments: pt able to recall precautions Spinal Brace Comments: Pt does not have to wear brace       Mobility Bed Mobility               General bed mobility comments: in recliner pre and post session    Transfers Overall transfer level: Needs assistance Equipment used: Rolling walker (2 wheeled) Transfers: Sit to/from [facility] Sit to Stand: Min guard Stand pivot transfers: Min guard       General transfer comment: MinguardA for mobility with RW and cues for safety    Balance Overall balance assessment: Needs assistance Sitting-balance support: Feet supported;No upper extremity supported Sitting balance-Leahy Scale: Good     Standing balance support: Bilateral upper extremity supported;Single extremity  supported;During functional activity Standing balance-Leahy Scale: Poor Standing balance comment: performing grooming with poor dynamic standing balance                           ADL either performed or assessed with clinical judgement   ADL Overall ADL's : Needs assistance/impaired     Grooming: Supervision/safety;Standing;Wash/dry face;Oral care;Brushing hair Grooming Details (indicate cue type and reason): standing x4 mins                 Toilet Transfer: Min guard;Ambulation;BSC;RW           Functional mobility during ADLs: Min guard;Rolling walker;Cueing for safety General ADL Comments: Pt exhibiting correct posture and maneuvering with ability to maintain precautions with challenges. Pt grooming at sink and more pain today in R side so additional movement did not happen today.     Vision       Perception     Praxis      Cognition Arousal/Alertness: Awake/alert Behavior During Therapy: WFL for tasks assessed/performed Overall Cognitive Status: Impaired/Different from baseline Area of Impairment: Memory;Following commands                     Memory: Decreased short-term memory Following Commands: Follows one step commands consistently       General Comments: Pt exhibiting correct posture and maneuvering with ability to maintain precautions with challenges.        Exercises     Shoulder Instructions       General Comments VSS on RA    Pertinent Vitals/ Pain       Pain Assessment: 0-10 Pain Score: 8  Pain  Location: R flank Pain Descriptors / Indicators: Discomfort Pain Intervention(s): Monitored during session  Home Living                                          Prior Functioning/Environment              Frequency  Min 2X/week        Progress Toward Goals  OT Goals(current goals can now be found in the care plan section)  Progress towards OT goals: Progressing toward goals  Acute Rehab OT  Goals Patient Stated Goal: to go home OT Goal Formulation: With patient/family Time For Goal Achievement: 06/15/20 Potential to Achieve Goals: Good ADL Goals Pt Will Perform Grooming: with supervision;standing Pt Will Perform Upper Body Bathing: with supervision;sitting Pt Will Perform Lower Body Bathing: with modified independence;sit to/from stand Pt Will Perform Upper Body Dressing: with supervision;sitting Pt Will Perform Lower Body Dressing: with modified independence;sit to/from stand Pt Will Transfer to Toilet: with supervision;ambulating;regular height toilet;bedside commode;grab bars Pt Will Perform Toileting - Clothing Manipulation and hygiene: with supervision;sit to/from stand Pt Will Perform Tub/Shower Transfer: Shower transfer;shower seat;ambulating;rolling walker;with supervision Additional ADL Goal #1: Pt will walk to bathroom with walker and toilet with 3:1 over commode with mod I. Additional ADL Goal #2: Pt will gather all clothes prior to dressing from closet with walker with mod I.  Plan Discharge plan remains appropriate    Co-evaluation                 AM-PAC OT "6 Clicks" Daily Activity     Outcome Measure   Help from another person eating meals?: None Help from another person taking care of personal grooming?: None Help from another person toileting, which includes using toliet, bedpan, or urinal?: A Little Help from another person bathing (including washing, rinsing, drying)?: A Little Help from another person to put on and taking off regular upper body clothing?: None Help from another person to put on and taking off regular lower body clothing?: A Little 6 Click Score: 21    End of Session Equipment Utilized During Treatment: Gait belt;Rolling walker  OT Visit Diagnosis: Unsteadiness on feet (R26.81);Muscle weakness (generalized) (M62.81);Pain Pain - Right/Left: Right Pain - part of body:  (back/leg)   Activity Tolerance Patient limited by  pain   Patient Left in chair;with call bell/phone within reach;with chair alarm set;with family/visitor present   Nurse Communication Mobility status;Precautions        Time: 0388-8280 OT Time Calculation (min): 28 min  Charges: OT General Charges $OT Visit: 1 Visit OT Treatments $Self Care/Home Management : 23-37 mins  Jefferey Pica, OTR/L Acute Rehabilitation Services Pager: [phone] Office: [place] 06/06/2020, 8:00 PM

## 2020-06-06 NOTE — Progress Notes (Signed)
Pharmacy Antibiotic Note  Jeffery Fernandez is a 75 y.o. male admitted on 05/30/2020 from a SNF with a wound infection.  Pharmacy has been consulted for vancomycin and cefepime dosing.   Patient is s/p debridement 5/9. ID is consulted. Waiting on cultures from 5/9. Scr trend down to 1.27, appears to be around his baseline.   Plan: Vancomycin 1500 mg IV q24h (eAUC 502, Scr used 1.27) Cefepime 2 gm IV Q 12 hrs Monitor cultures, renal function, ID plan  Height: 5' 9.5" (176.5 cm) Weight: 91.2 kg (201 lb) IBW/kg (Calculated) : 71.85  Temp (24hrs), Avg:97.7 F (36.5 C), Min:97.3 F (36.3 C), Max:98.5 F (36.9 C)  Recent Labs  Lab 06/01/20 1217 06/03/20 0208 06/05/20 0420  CREATININE 1.85* 1.50* 1.27*    Estimated Creatinine Clearance: 54.8 mL/min (A) (by C-G formula based on SCr of 1.27 mg/dL (H)).    Allergies  Allergen Reactions  . Penicillins Other (See Comments)    Possibly rash - not sure Has patient had a PCN reaction causing immediate rash, facial/tongue/throat swelling, SOB or lightheadedness with hypotension:unknown Has patient had a PCN reaction causing severe rash involving mucus membranes or skin necrosis: Unknown Has patient had a PCN reaction that required hospitalization: Unknown Has patient had a PCN reaction occurring within the last 10 years: No Childhood reaction If all of the above answers are "NO", then may proceed with Cephalosporin use.     Antimicrobials this admission: Vancomycin:  5/4 >> Cefepime:  5/9 >>  Microbiology results: 5/3 BCx X 2: NG/final 5/3 UCx: NG/final 5/3 COVID, flu A, flu B: negative 5/3 C diff: negative  Thank you for allowing pharmacy to be a part of this patient's care.  Rebbeca Paul, PharmD PGY1 Pharmacy Resident 06/06/2020 11:25 AM  Please check [email or web address] for unit-specific pharmacy phone numbers.

## 2020-06-07 ENCOUNTER — Inpatient Hospital Stay: Payer: Self-pay

## 2020-06-07 DIAGNOSIS — E1169 Type 2 diabetes mellitus with other specified complication: Secondary | ICD-10-CM

## 2020-06-07 DIAGNOSIS — N179 Acute kidney failure, unspecified: Secondary | ICD-10-CM

## 2020-06-07 LAB — BASIC METABOLIC PANEL
Anion gap: 8 (ref 5–15)
BUN: 16 mg/dL (ref 8–23)
CO2: 23 mmol/L (ref 22–32)
Calcium: 8 mg/dL — ABNORMAL LOW (ref 8.9–10.3)
Chloride: 105 mmol/L (ref 98–111)
Creatinine, Ser: 1.47 mg/dL — ABNORMAL HIGH (ref 0.61–1.24)
GFR, Estimated: 49 mL/min — ABNORMAL LOW (ref >60)
Glucose, Bld: 96 mg/dL (ref 70–99)
Potassium: 3.6 mmol/L (ref 3.5–5.1)
Sodium: 136 mmol/L (ref 135–145)

## 2020-06-07 LAB — GLUCOSE, CAPILLARY
Glucose-Capillary: 100 mg/dL — ABNORMAL HIGH (ref 70–99)
Glucose-Capillary: 123 mg/dL — ABNORMAL HIGH (ref 70–99)
Glucose-Capillary: 138 mg/dL — ABNORMAL HIGH (ref 70–99)
Glucose-Capillary: 144 mg/dL — ABNORMAL HIGH (ref 70–99)
Glucose-Capillary: 162 mg/dL — ABNORMAL HIGH (ref 70–99)
Glucose-Capillary: 223 mg/dL — ABNORMAL HIGH (ref 70–99)

## 2020-06-07 MED ORDER — SODIUM CHLORIDE 0.9% FLUSH
10.0000 mL | Freq: Two times a day (BID) | INTRAVENOUS | Status: DC
  Administered 2020-06-07 – 2020-06-08 (×3): 10 mL

## 2020-06-07 MED ORDER — SODIUM CHLORIDE 0.9% FLUSH: 10.0000 mL | INTRAVENOUS | Status: DC | PRN

## 2020-06-07 MED ORDER — ADULT MULTIVITAMIN W/MINERALS CH
1.0000 | ORAL_TABLET | Freq: Every day | ORAL | Status: DC
  Administered 2020-06-08 – 2020-06-09 (×2): 1 via ORAL
  Filled 2020-06-07 (×2): qty 1

## 2020-06-07 MED ORDER — VANCOMYCIN HCL 1250 MG/250ML IV SOLN
1250.0000 mg | INTRAVENOUS | Status: DC
  Administered 2020-06-07: 1250 mg via INTRAVENOUS
  Filled 2020-06-07 (×2): qty 250

## 2020-06-07 MED ORDER — CHLORHEXIDINE GLUCONATE CLOTH 2 % EX PADS
6.0000 | MEDICATED_PAD | Freq: Every day | CUTANEOUS | Status: DC
  Administered 2020-06-07 – 2020-06-08 (×2): 6 via TOPICAL

## 2020-06-07 MED ORDER — GLUCERNA SHAKE PO LIQD
237.0000 mL | Freq: Three times a day (TID) | ORAL | Status: DC
  Administered 2020-06-07 – 2020-06-08 (×5): 237 mL via ORAL

## 2020-06-07 NOTE — Progress Notes (Signed)
[facility] Infectious Disease  Date of Admission:  05/30/2020           Reason for visit: Follow up on postoperative wound infection  Current antibiotics: Cefepime Vancomycin  ASSESSMENT:    1. Postoperative wound infection: Status post I&D with wound VAC placement 06/05/2020 after initial L3/L4 decompression with hardware placement 05/11/2020 with neurosurgery.  OR cultures with Providencia rettgeri and further susceptibilities pending.  Operative note reviewed eventually encountered thin liquid and exudate with drainage that did not seem to go below the muscle/fascia. 2. CKD: Creatinine today 1.47 3. Type 2 diabetes: Recent A1c 9.3  PLAN:    . Continue cefepime per pharmacy guidance . Pharmacy has been adjusting his vancomycin dosing and considering daptomycin as an outpatient.  He received a dose of vancomycin earlier this afternoon prior to culture availability but can likely just discontinue at this point . Monitor renal function and glycemic control and wound care . PICC line ordered  . Hopefully will have susceptibilities back tomorrow for final recommendations   Principal Problem:   Postoperative wound infection Active Problems:   Delayed surgical wound healing    MEDICATIONS:    Scheduled Meds: . amLODipine  5 mg Oral Daily  . atorvastatin  20 mg Oral QHS  . carvedilol  25 mg Oral BID WC  . docusate sodium  100 mg Oral BID  . feeding supplement (GLUCERNA SHAKE)  237 mL Oral TID BM  . hydrochlorothiazide  12.5 mg Oral BID  . insulin glargine  30 Units Subcutaneous Daily  . lisinopril  40 mg Oral Daily  . [START ON 06/08/2020] multivitamin with minerals  1 tablet Oral Daily  . sertraline  100 mg Oral Daily   Continuous Infusions: . ceFEPime (MAXIPIME) IV 2 g (06/07/20 0449)  . dextrose 5 % and 0.9 % NaCl with KCl 20 mEq/L 110 mL/hr at 06/06/20 0603  . vancomycin 1,250 mg (06/07/20 1219)   PRN Meds:.acetaminophen **OR** acetaminophen, bisacodyl,  cyclobenzaprine, HYDROcodone-acetaminophen, HYDROmorphone (DILAUDID) injection, loperamide, metoprolol tartrate, ondansetron **OR** ondansetron (ZOFRAN) IV, polyethylene glycol, sodium phosphate, zolpidem  SUBJECTIVE:   24 hour events:  No acute events  Patient without any acute complaints this morning.  He has no fevers or chills.  He is tolerating antibiotics.  His pain is controlled and he has no other complaints.    Review of Systems  All other systems reviewed and are negative.     OBJECTIVE:   Blood pressure 116/64, pulse 70, temperature 97.8 F (36.6 C), temperature source Oral, resp. rate 19, height 5' 9.5" (1.765 m), weight 91.2 kg, SpO2 97 %. Body mass index is 29.26 kg/m.  Physical Exam Constitutional:      General: He is not in acute distress.    Appearance: Normal appearance.  HENT:     Head: Normocephalic and atraumatic.  Pulmonary:     Effort: Pulmonary effort is normal. No respiratory distress.  Neurological:     General: No focal deficit present.     Mental Status: He is alert and oriented to person, place, and time.  Psychiatric:        Mood and Affect: Mood normal.        Behavior: Behavior normal.      Lab Results: Lab Results  Component Value Date   WBC 8.5 05/30/2020   HGB 10.2 (L) 05/30/2020   HCT 31.6 (L) 05/30/2020   MCV 90.8 05/30/2020   PLT 274 05/30/2020    Lab Results  Component Value Date   NA 136 06/07/2020   K 3.6 06/07/2020   CO2 23 06/07/2020   GLUCOSE 96 06/07/2020   BUN 16 06/07/2020   CREATININE 1.47 (H) 06/07/2020   CALCIUM 8.0 (L) 06/07/2020   GFRNONAA 49 (L) 06/07/2020   GFRAA 47 (L) 01/14/2018    Lab Results  Component Value Date   ALT 47 (H) 05/30/2020   AST 54 (H) 05/30/2020   ALKPHOS 90 05/30/2020   BILITOT 0.5 05/30/2020       Component Value Date/Time   CRP 3.4 (H) 05/30/2020 1242       Component Value Date/Time   ESRSEDRATE 72 (H) 05/30/2020 1242     I have reviewed the micro and lab results  in Epic.  Imaging: [place] EKG SITE RITE  Result Date: 06/07/2020 If Site Rite image not attached, placement could not be confirmed due to current cardiac rhythm.  [place] EKG SITE RITE  Result Date: 06/06/2020 If Site Rite image not attached, placement could not be confirmed due to current cardiac rhythm.    Imaging independently reviewed in Epic.    Raynelle Highland for Infectious Disease [facility] [phone] pager 06/07/2020, 2:39 PM

## 2020-06-07 NOTE — Progress Notes (Signed)
Inpatient Diabetes Program Recommendations  AACE/ADA: New Consensus Statement on Inpatient Glycemic Control (2015)  Target Ranges:  Prepandial:   less than 140 mg/dL      Peak postprandial:   less than 180 mg/dL (1-2 hours)      Critically ill patients:  140 - 180 mg/dL   Lab Results  Component Value Date   GLUCAP 138 (H) 06/07/2020   HGBA1C 9.3 (H) 05/11/2020    Review of Glycemic Control Results for NATALE, THOMA (MRN [id number]) as of 06/07/2020 13:52  Ref. Range 06/06/2020 07:18 06/06/2020 11:36 06/06/2020 15:53 06/06/2020 20:09 06/06/2020 23:32 06/07/2020 04:06 06/07/2020 08:39 06/07/2020 11:55  Glucose-Capillary Latest Ref Range: 70 - 99 mg/dL 207 (H) 297 (H) 139 (H) 136 (H) 199 (H) 100 (H) 123 (H) 138 (H)    Diabetes history: DM 2 Outpatient Diabetes medications: Glipizide 10 mg bid, Lantus 40 units Daily Current orders for Inpatient glycemic control:  Lantus 30 units Daily  Glucerna tid between meals, glucose trends better since switching from Ensure Enlive supplement  Consult for DM management  -   Could add Novolog 0-9 units tid + hs while inpatient  Controlled inpatient with lower doses of medications than prescribed outpatient. Pt most likely not following Diabetic diet at home.  Outpatient: -  lower Lantus dose to 30 units -  d/c glipizide dose  Follow up with PCP if glucose trends increase for resumption of glipizide and Lantus doses.  Thanks,  Tama Headings RN, MSN, BC-ADM Inpatient Diabetes Coordinator Team Pager [phone] (8a-5p)

## 2020-06-07 NOTE — Progress Notes (Signed)
Peripherally Inserted Central Catheter Placement  The IV Nurse has discussed with the patient and/or persons authorized to consent for the patient, the purpose of this procedure and the potential benefits and risks involved with this procedure.  The benefits include less needle sticks, lab draws from the catheter, and the patient may be discharged home with the catheter. Risks include, but not limited to, infection, bleeding, blood clot (thrombus formation), and puncture of an artery; nerve damage and irregular heartbeat and possibility to perform a PICC exchange if needed/ordered by physician.  Alternatives to this procedure were also discussed.  Bard Power PICC patient education guide, fact sheet on infection prevention and patient information card has been provided to patient /or left at bedside.    PICC Placement Documentation  PICC Single Lumen 06/07/20 Right Brachial 42 cm 0 cm (Active)  Indication for Insertion or Continuance of Line Home intravenous therapies (PICC only) 06/07/20 1700  Exposed Catheter (cm) 0 cm 06/07/20 1700  Site Assessment Clean;Dry;Intact 06/07/20 1700  Line Status Flushed;Saline locked;Blood return noted 06/07/20 1700  Dressing Type Transparent;Securing device 06/07/20 1700  Dressing Status Clean;Dry;Intact 06/07/20 1700  Antimicrobial disc in place? Yes 06/07/20 1700  Safety Lock Not Applicable 07/61/51 8343  Line Care Connections checked and tightened 06/07/20 1700  Dressing Intervention New dressing 06/07/20 1700  Dressing Change Due 06/14/20 06/07/20 [place], [place] 06/07/2020, 5:02 PM

## 2020-06-07 NOTE — Progress Notes (Signed)
Nutrition Follow-up  DOCUMENTATION CODES:   Not applicable  INTERVENTION:   - Glucerna Shake po TID, each supplement provides 220 kcal and 10 grams of protein  - MVI with minerals daily  NUTRITION DIAGNOSIS:   Increased nutrient needs related to wound healing as evidenced by estimated needs.  Ongoing, being addressed via supplements  GOAL:   Patient will meet greater than or equal to 90% of their needs  Progressing  MONITOR:   PO intake,Supplement acceptance,Skin,Weight trends,Labs,I & O's  REASON FOR ASSESSMENT:   Malnutrition Screening Tool    ASSESSMENT:   75 y.o. male with a past medical history significant for uncontrolled diabetes, CKD, hypertension, and left BBB. Pt underwent an L4-5 decompression, instrumentation and fusion on 05/11/2020. Pt presents weakness, diarrhea and serosanguinous drainage from his surgical wound.  5/09 - s/p I&D of wound, placement of wound VAC  Spoke with pt and daughter at bedside. Pt and daughter report greatly improved PO intake over the last few days. Pt's family has been bringing in food for pt to consume in addition to hospital food. Pt has been consuming some Glucerna supplements and prefers these over the Ensure Enlive. Will change supplement to Glucerna.  Discussed importance of adequate PO intake with a focus on protein-rich foods to promote wound healing. Pt and family express understanding. They do not think that pt will have an issue with getting enough protein in.  Meal Completion: 75-90% x last 3 documented meals  Medications reviewed and include: colace, Ensure Enlive TID, lantus 30 units daily, IV abx  Labs reviewed: creatinine 1.47 CBG's: 100-297 x 24 hours  UOP: 675 ml x 24 hours VAC: 100 ml x 24 hours I/O's: +9.7 L since admit  Diet Order:   Diet Order            Diet Carb Modified Fluid consistency: Thin; Room service appropriate? Yes  Diet effective now                 EDUCATION NEEDS:   Not  appropriate for education at this time  Skin:  Skin Assessment: Skin Integrity Issues: Wound VAC: back Other: surgial wound on back  Last BM:  06/06/20  Height:   Ht Readings from Last 1 Encounters:  05/30/20 5' 9.5" (1.765 m)    Weight:   Wt Readings from Last 1 Encounters:  05/30/20 91.2 kg    BMI:  Body mass index is 29.26 kg/m.  Estimated Nutritional Needs:   Kcal:  2000-2200  Protein:  100-115 grams  Fluid:  >/= 2 L/day    Gustavus Bryant, MS, RD, LDN Inpatient Clinical Dietitian Please see AMiON for contact information.

## 2020-06-07 NOTE — Progress Notes (Addendum)
Pharmacy Antibiotic Note  Jeffery Fernandez is a 76 y.o. male admitted on 05/30/2020 from a SNF with a wound infection.  Pharmacy has been consulted for vancomycin and cefepime dosing.   Patient is s/p debridement 5/9. ID is consulted. Waiting on cultures from 5/9. Scr bumped back up to 1.47 today. Will reduce dose and check levels. Considering switching to daptomycin given fluctuating renal function. Cost of daptomycin after discharge would be $34 per day.   Plan: Vancomycin 1250 mg IV q24h  5/11 1500 vanc peak, 5/12 1200 vanc trough Cefepime 2 gm IV Q 12 hrs  Monitor cultures, renal function, ID plan Will discuss cost of daptomycin with patient/family tomorrow  Height: 5' 9.5" (176.5 cm) Weight: 91.2 kg (201 lb) IBW/kg (Calculated) : 71.85  Temp (24hrs), Avg:98.1 F (36.7 C), Min:97.8 F (36.6 C), Max:98.5 F (36.9 C)  Recent Labs  Lab 06/01/20 1217 06/03/20 0208 06/05/20 0420 06/07/20 0452  CREATININE 1.85* 1.50* 1.27* 1.47*    Estimated Creatinine Clearance: 47.4 mL/min (A) (by C-G formula based on SCr of 1.47 mg/dL (H)).    Allergies  Allergen Reactions  . Penicillins Other (See Comments)    Possibly rash - not sure Has patient had a PCN reaction causing immediate rash, facial/tongue/throat swelling, SOB or lightheadedness with hypotension:unknown Has patient had a PCN reaction causing severe rash involving mucus membranes or skin necrosis: Unknown Has patient had a PCN reaction that required hospitalization: Unknown Has patient had a PCN reaction occurring within the last 10 years: No Childhood reaction If all of the above answers are "NO", then may proceed with Cephalosporin use.     Antimicrobials this admission: Vancomycin:  5/4 >> Cefepime:  5/9 >>  Microbiology results: 5/3 BCx X 2: NG/final 5/3 UCx: NG/final 5/3 COVID, flu A, flu B: negative 5/3 C diff: negative 5/9 BCx: Few Providencia rettgeri  Thank you for allowing pharmacy to be a part of this  patient's care.  Rebbeca Paul, PharmD PGY1 Pharmacy Resident 06/07/2020 2:07 PM  Please check [email or web address] for unit-specific pharmacy phone numbers.

## 2020-06-07 NOTE — TOC Initial Note (Signed)
Transition of Care (TOC) - Initial/Assessment Note  Marvetta Gibbons RN,BSN Transitions of Care Unit 4NP (non trauma) - RN Case Manager See Treatment Team for direct Phone #   Patient Details  Name: Jeffery Fernandez MRN: [id number] Date of Birth: August 20, 1945  Transition of Care [facility]) CM/SW Contact:    Dawayne Patricia, RN Phone Number: 06/07/2020, 5:13 PM  Clinical Narrative:                 Noted pt will need home wound VAC and IV abx for transition home, spoke with NP- Meghan with Dr. Arnoldo Morale regarding home [place] will send escribe script for her to sign later today to begin process for home wound VAC approval.  Called and spoke with Olivia Mackie at [facility] who will work on home wound VAC order and approval.   CM spoke with pt and daughter at the bedside- per discussion- daughter states they had [place] come out to the home one time prior to pt being re-admitted. She is not sure what agency came out. List provided for choice Per CMS guidelines from medicare.gov website with star ratings (copy placed in shadow chart), daughter thinks maybe [facility] or [facility] ??- will make some phone calls and see if we can find out agency- Per pt and daughter they are agreeable to whoever was coming out to the home to continue services. Also discussed home IV abx needs and infusion company- they do not have a preference for this and are agreeable to using [facility]- will reach out to [facility] to connect with them for education prior to discharge.   Call made to [facility] with [facility]- pt is not active with them, per Patient [place] system- looks like pt might be active with [facility]. - call made to [facility] with [place] and confirmed pt is active with them for RN/PT/SLP. - updated Stacie on pt's needs- for RN staffing needs would be best if they could resume care after Friday- so will plan for pt to have wound VAC drsg change here on Friday prior to discharge home.   Spoke with Pam at [facility]- she will plan to  meet with family tomorrow for IV abx education, pt is still pending PICC line placement and final plan for home IV abx needs.   1530- received call from [place] with [facility]- pt has [facility] and will need to pay copay upfront- Olivia Mackie to contact family regarding this, home wound VAC approval may take up to 48 hr for approval.   TOC will continue to follow for transition of care coordination, [place] orders have been placed for HHRN/PT/OT  Expected Discharge Plan: [place] Barriers to Discharge: Continued Medical Work up   Patient Goals and CMS Choice Patient states their goals for this hospitalization and ongoing recovery are:: return home CMS Medicare.gov Compare Post Acute Care list provided to:: Patient Represenative (must comment) (daughter) Choice offered to / list presented to : [place]  Expected Discharge Plan and Services Expected Discharge Plan: [place]   Discharge Planning Services: CM Consult Post Acute Care Choice: Durable Medical Equipment,Home Health,Resumption of Svcs/PTA Provider Living arrangements for the past 2 months: Single Family Home                 DME Arranged: Vac DME Agency: [facility] Date DME Agency Contacted: 06/07/20 Time DME Agency Contacted: 1200 Representative spoke with at DME Agency: [place]: [place] Agency: [facility] Date [facility] Agency Contacted: 06/07/20 Time HH Agency Contacted: 1430 Representative spoke with at [place] Arrangements/Services Living arrangements for the past 2 months: [place] with:: Self,Spouse Patient language and need for interpreter reviewed:: Yes        Need for Family Participation in Patient Care: Yes (Comment) Care giver support system in place?: Yes (comment) Current home services: DME,Home RN,Home PT Criminal Activity/Legal Involvement Pertinent to Current  Situation/Hospitalization: No - Comment as needed  Activities of Daily Living Home Assistive Devices/Equipment: Dentures (specify type),Eyeglasses,Walker (specify type),CBG Meter,CPAP,Grab bars around toilet,Grab bars in shower,Shower chair with back ADL Screening (condition at time of admission) Patient's cognitive ability adequate to safely complete daily activities?: Yes Is the patient deaf or have difficulty hearing?: Yes Does the patient have difficulty seeing, even when wearing glasses/contacts?: Yes Does the patient have difficulty concentrating, remembering, or making decisions?: Yes Patient able to express need for assistance with ADLs?: Yes Does the patient have difficulty dressing or bathing?: Yes Independently performs ADLs?: No Dressing (OT): Needs assistance Feeding: Independent Bathing: Needs assistance Is this a change from baseline?: Change from baseline, expected to last >3 days Toileting: Dependent Is this a change from baseline?: Change from baseline, expected to last >3days In/Out Bed: Dependent Is this a change from baseline?: Change from baseline, expected to last >3 days Walks in Home: Independent Does the patient have difficulty walking or climbing stairs?: No Weakness of Legs: Both Weakness of Arms/Hands: None  Permission Sought/Granted Permission sought to share information with : Case Manager Permission granted to share information with : Yes, Verbal Permission Granted     Permission granted to share info w AGENCY: HH/DME        Emotional Assessment Appearance:: Appears stated age Attitude/Demeanor/Rapport: Engaged Affect (typically observed): Accepting Orientation: : Oriented to Self,Oriented to Place,Oriented to  Time,Oriented to Situation Alcohol / Substance Use: Not Applicable Psych Involvement: No (comment)  Admission diagnosis:  AKI (acute kidney injury) ([place]) [N17.9] Wound infection after surgery [T81.49XA] Delayed surgical wound healing  [T81.89XA] Patient Active Problem List   Diagnosis Date Noted  . Postoperative wound infection 06/06/2020  . Delayed surgical wound healing 05/30/2020  . 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 Lumbar compression fracture ([place]) 01/19/2018  . White coat syndrome with diagnosis of hypertension 09/19/2017  . Normocytic anemia 04/23/2017  . Heme positive stool 04/23/2017  . Cervical spondylosis with myelopathy and radiculopathy 06/26/2016  . Spinal stenosis of cervical region 06/26/2016  . Personal history of colonic polyps 06/14/2011  . CAD (coronary artery disease) 07/10/2010  . OSA (obstructive sleep apnea) 06/10/2010  . Hypertension 06/10/2010  . COPD (chronic obstructive pulmonary disease) ([place]) 06/10/2010  . Hyperlipidemia 06/10/2010  . Retinal artery occlusion 06/10/2010   PCP:  Lilian Coma., MD Pharmacy:   [id number] - [place], [address] [place] [address] Phone: [phone] Fax: [phone]     Social Determinants of Health (SDOH) Interventions    Readmission Risk Interventions No flowsheet data found.

## 2020-06-07 NOTE — Progress Notes (Signed)
Physical Therapy Treatment Patient Details Name: Jeffery Fernandez MRN: [id number] DOB: May 21, 1945 Today's Date: 06/07/2020    History of Present Illness Pt is a 75 y.o. M admitted 5/3 with decreased appetite nausea, diarrhea, generalized weakness and seosanguineous drainage from his surgical wound. Significant PMH: uncontrolled diabetes, CKD, HTN, L BBB, L3 kyphoplasty, recent L4-5 decompression and fusion 05/11/2020. Discharged to [facility] 05/19/2020 and returned home 05/23/2020.    PT Comments    Pt is demonstrating improved activity tolerance and stability with gait, ambulating up to ~200 ft with a RW and min guard assist and no need for a standing rest break this date. However, pt was fatigued by the end of the gait bout. Pt with no overt LOB but displaying poor RW management when turning, needing cues to remain proximal and inside RW with fair carryover noted. Will continue to follow acutely. Current recommendations remain appropriate.   Follow Up Recommendations  Home health PT;Supervision/Assistance - 24 hour     Equipment Recommendations  None recommended by PT    Recommendations for Other Services       Precautions / Restrictions Precautions Precautions: Back;Fall Precaution Booklet Issued: Yes (comment) Precaution Comments: wound vac back; cues to maintain Required Braces or Orthoses: Spinal Brace (for comfort) Spinal Brace: Lumbar corset;Applied in sitting position Spinal Brace Comments: pt does not have to wear the spinal brace. pt reports it is loose since recent wt loss. able to adjust to be supportive previous session Restrictions Weight Bearing Restrictions: No    Mobility  Bed Mobility               General bed mobility comments: Pt sitting up in recliner upon arrival and returned to recliner end of session, per pt request.    Transfers Overall transfer level: Needs assistance Equipment used: Rolling walker (2 wheeled) Transfers: Sit to/from Stand Sit to Stand:  Min guard         General transfer comment: Min guard for safety, no overt LOB. Pt able to recall one hand on sitting surface to push up to stand without cues. Pt reaches back for chair to sit without cues.  Ambulation/Gait Ambulation/Gait assistance: Min guard Gait Distance (Feet): 200 Feet Assistive device: Rolling walker (2 wheeled) Gait Pattern/deviations: Step-through pattern;Decreased stride length;Decreased step length - right;Trunk flexed Gait velocity: reduced Gait velocity interpretation: <1.31 ft/sec, indicative of household ambulator General Gait Details: Pt ambulates at slow pace, tendency to look inferiorly at ground, and slight trunk flexion. Initially, demonstrated decreased R step length, VCs provided to correct with success. Cues to improve posture too. No overt LOB. Poor RW management when turning, needing cues to remain within RW rather than push it distal or step a foot lateral to it, fair carryover with subsequent reps and verbal and visual cues. No standing rest break needed today.   Stairs             Wheelchair Mobility    Modified Rankin (Stroke Patients Only)       Balance Overall balance assessment: Needs assistance         Standing balance support: Bilateral upper extremity supported;During functional activity Standing balance-Leahy Scale: Poor Standing balance comment: Bil UE support for mobility.                            Cognition Arousal/Alertness: Awake/alert Behavior During Therapy: WFL for tasks assessed/performed Overall Cognitive Status: Impaired/Different from baseline Area of Impairment: Memory;Following commands  Memory: Decreased short-term memory Following Commands: Follows one step commands with increased time       General Comments: Pt with difficulty sequencing and following directions with RW management when turning, but fair carryover with subsequest reps.      Exercises       General Comments        Pertinent Vitals/Pain Pain Assessment: Faces Faces Pain Scale: Hurts even more Pain Location: back, surgical site Pain Descriptors / Indicators: Discomfort;Grimacing;Guarding Pain Intervention(s): Limited activity within patient's tolerance;Monitored during session;Repositioned    Home Living                      Prior Function            PT Goals (current goals can now be found in the care plan section) Acute Rehab PT Goals Patient Stated Goal: to improve with mobility PT Goal Formulation: With patient/family Time For Goal Achievement: 06/14/20 Potential to Achieve Goals: Good Progress towards PT goals: Progressing toward goals    Frequency    Min 3X/week      PT Plan Current plan remains appropriate    Co-evaluation              AM-PAC PT "6 Clicks" Mobility   Outcome Measure  Help needed turning from your back to your side while in a flat bed without using bedrails?: A Little Help needed moving from lying on your back to sitting on the side of a flat bed without using bedrails?: A Little Help needed moving to and from a bed to a chair (including a wheelchair)?: A Little Help needed standing up from a chair using your arms (e.g., wheelchair or bedside chair)?: A Little Help needed to walk in hospital room?: A Little Help needed climbing 3-5 steps with a railing? : A Little 6 Click Score: 18    End of Session Equipment Utilized During Treatment: Gait belt Activity Tolerance: Patient tolerated treatment well Patient left: with call bell/phone within reach;in chair;with chair alarm set;with family/visitor present Nurse Communication: Mobility status PT Visit Diagnosis: Unsteadiness on feet (R26.81);Muscle weakness (generalized) (M62.81);History of falling (Z91.81);Other abnormalities of gait and mobility (R26.89);Difficulty in walking, not elsewhere classified (R26.2)     Time: 1859-0931 PT Time Calculation (min)  (ACUTE ONLY): 19 min  Charges:  $Gait Training: 8-22 mins                     Moishe Spice, PT, DPT Acute Rehabilitation Services  Pager: [phone] Office: [place] 06/07/2020, 3:07 PM

## 2020-06-07 NOTE — Progress Notes (Signed)
Subjective: The patient is alert and pleasant.  His daughter is at the bedside.  He looks well.  Objective: Vital signs in last 24 hours: Temp:  [97.8 F (36.6 C)-98.5 F (36.9 C)] 97.8 F (36.6 C) (05/11 1157) Pulse Rate:  [59-70] 70 (05/11 1157) Resp:  [15-19] 19 (05/11 1157) BP: (116-149)/(64-81) 116/64 (05/11 1157) SpO2:  [97 %-98 %] 97 % (05/11 1157) Estimated body mass index is 29.26 kg/m as calculated from the following:   Height as of this encounter: 5' 9.5" (1.765 m).   Weight as of this encounter: 91.2 kg.   Intake/Output from previous day: 05/10 0701 - 05/11 0700 In: 1883 [P.O.:480; I.V.:1038.6; IV Piggyback:364.5] Out: 775 [Urine:675; Drains:100] Intake/Output this shift: Total I/O In: 300 [P.O.:300] Out: 200 [Urine:200]  Physical exam the patient is alert and oriented.  His strength is normal.  Lab Results: No results for input(s): WBC, HGB, HCT, PLT in the last 72 hours. BMET Recent Labs    06/05/20 0420 06/07/20 0452  NA 136 136  K 3.8 3.6  CL 110 105  CO2 21* 23  GLUCOSE 142* 96  BUN 12 16  CREATININE 1.27* 1.47*  CALCIUM 7.8* 8.0*    Studies/Results: [place] EKG SITE RITE  Result Date: 06/06/2020 If Site Rite image not attached, placement could not be confirmed due to current cardiac rhythm.   Assessment/Plan: Postop day #2: I will asked the wound care team to change his wound VAC.  I will await final recommendations for antibiotic treatment and length of therapy.  We will place a PICC line.  We will arrange for home IV antibiotics and dressing changes.  Perhaps he can go home tomorrow.  Diabetes mellitus: I will asked the hospitalist to help manage his diabetes.  LOS: 8 days     Ophelia Charter 06/07/2020, 1:45 PM

## 2020-06-07 NOTE — Consult Note (Signed)
[place] Nurse Consult Note: Patient receiving care in 4NP07 Reason for Consult: NPWT back Wound type: Surgical  Pressure Injury POA: NA Measurement: 5 cm x 3 cm x 2 cm Wound bed: Yellow,pink with no bleeding upon removal of foam dressing.  Periwound: Erythematous pink Dressing procedure/placement/frequency: One piece of black foam removed from wound. One piece of black foam removed from bridging site. One piece of black foam placed into the wound with one narrow piece of foam bridged to the right side. Drape applied, immediate seal obtained at 125 mmHg. MWF vac change. Patient to be discharged tomorrow or Friday with [facility] services. WOC will not follow at this time  Monitor the wound area(s) for worsening of condition such as: Signs/symptoms of infection, increase in size, development of or worsening of odor, development of pain, or increased pain at the affected locations.   Notify the medical team if any of these develop.  Thank you for the consult. [place] nurse will not follow at this time.   Please re-consult the [place] team if needed.  Cathlean Marseilles Tamala Julian, MSN, RN, [place], Lysle Pearl, [facility] Wound Treatment Associate Pager [phone]

## 2020-06-08 LAB — BASIC METABOLIC PANEL
Anion gap: 7 (ref 5–15)
BUN: 20 mg/dL (ref 8–23)
CO2: 23 mmol/L (ref 22–32)
Calcium: 8 mg/dL — ABNORMAL LOW (ref 8.9–10.3)
Chloride: 105 mmol/L (ref 98–111)
Creatinine, Ser: 1.53 mg/dL — ABNORMAL HIGH (ref 0.61–1.24)
GFR, Estimated: 47 mL/min — ABNORMAL LOW (ref >60)
Glucose, Bld: 141 mg/dL — ABNORMAL HIGH (ref 70–99)
Potassium: 3.4 mmol/L — ABNORMAL LOW (ref 3.5–5.1)
Sodium: 135 mmol/L (ref 135–145)

## 2020-06-08 LAB — GLUCOSE, CAPILLARY
Glucose-Capillary: 133 mg/dL — ABNORMAL HIGH (ref 70–99)
Glucose-Capillary: 163 mg/dL — ABNORMAL HIGH (ref 70–99)
Glucose-Capillary: 173 mg/dL — ABNORMAL HIGH (ref 70–99)
Glucose-Capillary: 170 mg/dL — ABNORMAL HIGH (ref 70–99)
Glucose-Capillary: 207 mg/dL — ABNORMAL HIGH (ref 70–99)

## 2020-06-08 NOTE — TOC Progression Note (Addendum)
Transition of Care (TOC) - Progression Note  Marvetta Gibbons RN,BSN Transitions of Care Unit 4NP (non trauma) - RN Case Manager See Treatment Team for direct Phone #   Patient Details  Name: Jeffery Fernandez MRN: [id number] Date of Birth: Jul 04, 1945  Transition of Care [facility] CM/SW Contact  Dahlia Client Romeo Rabon, RN Phone Number: 06/08/2020, 3:00 PM  Clinical Narrative:    [facility] still working on home wound [facility] approval with pt's [employer] plan- hoping that approval will come by tomorrow.  Pt was active with [place] for [facility] services and they can resume services post discharge- pt would need to have wound VAC drsg changed here on 5/13 for best transition home with home wound VAC start of care for first of next week.   Home IV abx have been set up with [facility]-OP[facility] orders in and Pam has done bedside education with family and are ready for when pt is transition home.   Transition is pending home wound VAC approval.   Expected Discharge Plan: [place] Barriers to Discharge: Continued Medical Work up  Expected Discharge Plan and Services Expected Discharge Plan: [place]   Discharge Planning Services: CM Consult Post Acute Care Choice: Durable Medical Equipment,Home Health,Resumption of Svcs/PTA Provider Living arrangements for the past 2 months: Single Family Home                 DME Arranged: Vac DME Agency: [facility] Date DME Agency Contacted: 06/07/20 Time DME Agency Contacted: 1200 Representative spoke with at DME Agency: Olivia Mackie HH Arranged: RN,PT,OT,Speech Therapy [place] Agency: [facility] Date [place]: 06/07/20 Time [place]: [place] spoke with at [place]: Stacie/Pam   Social Determinants of Health ([place]) Interventions    Readmission Risk Interventions No flowsheet data found.

## 2020-06-08 NOTE — Progress Notes (Signed)
[facility] for Infectious Disease  Date of Admission:  05/30/2020     Total days of antibiotics 9         ASSESSMENT:  Mr. Bondy surgical specimen is growing Providencia rettgeri with sensitivities remaining pending. Change antibiotics to Cefepime with plan for 2 weeks of treatment and re-evaluation in the office to determine any need for extended treatment. Home health and OPAT orders placed. Have contacted Home Health for discharge teaching. Ok from [place] standpoint for discharge after [place] teaching. Follow up in the ID office in 2 weeks or sooner if needed.   PLAN:  1. Change antibiotics to Cefepime.  2. Home Health / OPAT orders.  3. Continue wound care per Neurosurgery 4. Follow up in the ID office if 2 weeks.   Diagnosis: Post-operative Wound Infection  Culture Result: Providencia rettgeri  Allergies  Allergen Reactions  . Penicillins Other (See Comments)    Possibly rash - not sure Has patient had a PCN reaction causing immediate rash, facial/tongue/throat swelling, SOB or lightheadedness with hypotension:unknown Has patient had a PCN reaction causing severe rash involving mucus membranes or skin necrosis: Unknown Has patient had a PCN reaction that required hospitalization: Unknown Has patient had a PCN reaction occurring within the last 10 years: No Childhood reaction If all of the above answers are "NO", then may proceed with Cephalosporin use.     OPAT Orders Discharge antibiotics to be given via PICC line Discharge antibiotics: Cefepime Per pharmacy protocol  Aim for Vancomycin trough 15-20 or AUC 400-550 (unless otherwise indicated) Duration: 2 weeks  End Date: 06/19/20   [facility] Care Per Protocol:  Home health RN for IV administration and teaching; PICC line care and labs.    Labs weekly while on IV antibiotics: _X_ CBC with differential _X_ BMP __ CMP _X_ CRP _X_ ESR __ Vancomycin trough __ CK  __ Please pull PIC at completion of IV  antibiotics _X_ Please leave PIC in place until doctor has seen patient or been notified  Fax weekly labs to [phone]  Clinic Follow Up Appt:  06/19/20 at 2:30pm with Dr. Juleen China    Principal Problem:   Postoperative wound infection Active Problems:   Delayed surgical wound healing   . amLODipine  5 mg Oral Daily  . atorvastatin  20 mg Oral QHS  . carvedilol  25 mg Oral BID WC  . Chlorhexidine Gluconate Cloth  6 each Topical Daily  . docusate sodium  100 mg Oral BID  . feeding supplement (GLUCERNA SHAKE)  237 mL Oral TID BM  . hydrochlorothiazide  12.5 mg Oral BID  . insulin glargine  30 Units Subcutaneous Daily  . lisinopril  40 mg Oral Daily  . multivitamin with minerals  1 tablet Oral Daily  . sertraline  100 mg Oral Daily  . sodium chloride flush  10-40 mL Intracatheter Q12H    SUBJECTIVE:  Afebrile overnight with no acute events. Denies fevers/chills. Ready to go home.   Allergies  Allergen Reactions  . Penicillins Other (See Comments)    Possibly rash - not sure Has patient had a PCN reaction causing immediate rash, facial/tongue/throat swelling, SOB or lightheadedness with hypotension:unknown Has patient had a PCN reaction causing severe rash involving mucus membranes or skin necrosis: Unknown Has patient had a PCN reaction that required hospitalization: Unknown Has patient had a PCN reaction occurring within the last 10 years: No Childhood reaction If all of the above answers are "NO", then may proceed with  Cephalosporin use.      Review of Systems: Review of Systems  Constitutional: Negative for chills, fever and weight loss.  Respiratory: Negative for cough, shortness of breath and wheezing.   Cardiovascular: Negative for chest pain and leg swelling.  Gastrointestinal: Negative for abdominal pain, constipation, diarrhea, nausea and vomiting.  Skin: Negative for rash.      OBJECTIVE: Vitals:   06/07/20 2026 06/08/20 0315 06/08/20 0841  06/08/20 1119  BP: (!) 143/79 (!) 162/65 (!) 149/63 (!) 147/84  Pulse: 70  90 70  Resp: (!) '21 16 19 18  ' Temp: 97.8 F (36.6 C) 98 F (36.7 C) 97.8 F (36.6 C) 97.9 F (36.6 C)  TempSrc: Oral Oral Oral Oral  SpO2: 96% 95% 93% 96%  Weight:      Height:       Body mass index is 29.26 kg/m.  Physical Exam Constitutional:      General: He is not in acute distress.    Appearance: He is well-developed.  Cardiovascular:     Rate and Rhythm: Normal rate and regular rhythm.     Heart sounds: Normal heart sounds.  Pulmonary:     Effort: Pulmonary effort is normal.     Breath sounds: Normal breath sounds.  Skin:    General: Skin is warm and dry.  Neurological:     Mental Status: He is alert and oriented to person, place, and time.  Psychiatric:        Behavior: Behavior normal.        Thought Content: Thought content normal.        Judgment: Judgment normal.     Lab Results Lab Results  Component Value Date   WBC 8.5 05/30/2020   HGB 10.2 (L) 05/30/2020   HCT 31.6 (L) 05/30/2020   MCV 90.8 05/30/2020   PLT 274 05/30/2020    Lab Results  Component Value Date   CREATININE 1.53 (H) 06/08/2020   BUN 20 06/08/2020   NA 135 06/08/2020   K 3.4 (L) 06/08/2020   CL 105 06/08/2020   CO2 23 06/08/2020    Lab Results  Component Value Date   ALT 47 (H) 05/30/2020   AST 54 (H) 05/30/2020   ALKPHOS 90 05/30/2020   BILITOT 0.5 05/30/2020     Microbiology: Recent Results (from the past 240 hour(s))  Culture, blood (Routine x 2)     Status: None   Collection Time: 05/30/20  8:38 AM   Specimen: BLOOD LEFT FOREARM  Result Value Ref Range Status   Specimen Description BLOOD LEFT FOREARM  Final   Special Requests   Final    BOTTLES DRAWN AEROBIC AND ANAEROBIC Blood Culture results may not be optimal due to an inadequate volume of blood received in culture bottles   Culture   Final    NO GROWTH 5 DAYS Performed at [facility], [place] [address], [place], [place]  [address]    Report Status 06/04/2020 FINAL  Final  Resp Panel by RT-PCR (Flu A&B, Covid) Nasopharyngeal Swab     Status: None   Collection Time: 05/30/20  9:34 AM   Specimen: Nasopharyngeal Swab; Nasopharyngeal(NP) swabs in vial transport medium  Result Value Ref Range Status   SARS Coronavirus 2 by RT PCR NEGATIVE NEGATIVE Final    Comment: (NOTE) SARS-CoV-2 target nucleic acids are NOT DETECTED.  The SARS-CoV-2 RNA is generally detectable in upper respiratory specimens during the acute phase of infection. The lowest concentration of SARS-CoV-2 viral copies this  assay can detect is 138 copies/mL. A negative result does not preclude SARS-Cov-2 infection and should not be used as the sole basis for treatment or other patient management decisions. A negative result may occur with  improper specimen collection/handling, submission of specimen other than nasopharyngeal swab, presence of viral mutation(s) within the areas targeted by this assay, and inadequate number of viral copies(<138 copies/mL). A negative result must be combined with clinical observations, patient history, and epidemiological information. The expected result is Negative.  Fact Sheet for Patients:  EntrepreneurPulse.com.au  Fact Sheet for Healthcare Providers:  IncredibleEmployment.be  This test is no t yet approved or cleared by the [place] FDA and  has been authorized for detection and/or diagnosis of SARS-CoV-2 by FDA under an Emergency Use Authorization (EUA). This EUA will remain  in effect (meaning this test can be used) for the duration of the COVID-19 declaration under Section 564(b)(1) of the Act, 21 U.S.C.section 360bbb-3(b)(1), unless the authorization is terminated  or revoked sooner.       Influenza A by PCR NEGATIVE NEGATIVE Final   Influenza B by PCR NEGATIVE NEGATIVE Final    Comment: (NOTE) The Xpert Xpress SARS-CoV-2/FLU/RSV plus assay is intended as an  aid in the diagnosis of influenza from Nasopharyngeal swab specimens and should not be used as a sole basis for treatment. Nasal washings and aspirates are unacceptable for Xpert Xpress SARS-CoV-2/FLU/RSV testing.  Fact Sheet for Patients: EntrepreneurPulse.com.au  Fact Sheet for Healthcare Providers: IncredibleEmployment.be  This test is not yet approved or cleared by the [place] FDA and has been authorized for detection and/or diagnosis of SARS-CoV-2 by FDA under an Emergency Use Authorization (EUA). This EUA will remain in effect (meaning this test can be used) for the duration of the COVID-19 declaration under Section 564(b)(1) of the Act, 21 U.S.C. section 360bbb-3(b)(1), unless the authorization is terminated or revoked.  Performed at [facility], [place] [address], [place], [place] [address]   Culture, blood (Routine x 2)     Status: None   Collection Time: 05/30/20 12:42 PM   Specimen: BLOOD LEFT FOREARM  Result Value Ref Range Status   Specimen Description BLOOD LEFT FOREARM  Final   Special Requests   Final    BOTTLES DRAWN AEROBIC AND ANAEROBIC Blood Culture adequate volume   Culture   Final    NO GROWTH 5 DAYS Performed at [facility], [place] [address], [place], [place] [address]    Report Status 06/04/2020 FINAL  Final  C Difficile Quick Screen w PCR reflex     Status: None   Collection Time: 05/30/20  5:03 PM   Specimen: STOOL  Result Value Ref Range Status   C Diff antigen NEGATIVE NEGATIVE Final   C Diff toxin NEGATIVE NEGATIVE Final   C Diff interpretation No C. difficile detected.  Final    Comment: Performed at [facility], [place] [address] [place], [place] [address]  Culture, Urine     Status: None   Collection Time: 05/30/20 10:13 PM   Specimen: Urine, Catheterized  Result Value Ref Range Status   Specimen Description URINE, CATHETERIZED  Final   Special Requests Doxycycline last dose 05/28/2020   Final   Culture   Final    NO GROWTH Performed at [facility], [place] [address], [place], [place] [address]    Report Status 06/01/2020 FINAL  Final  Aerobic/Anaerobic Culture w Gram Stain (surgical/deep wound)     Status: None (  Preliminary result)   Collection Time: 06/05/20  4:24 PM   Specimen: PATH Other; Tissue  Result Value Ref Range Status   Specimen Description WOUND LUMBAR  Final   Special Requests NONE  Final   Gram Stain NO WBC SEEN NO ORGANISMS SEEN   Final   Culture   Final    FEW PROVIDENCIA RETTGERI SUSCEPTIBILITIES TO FOLLOW Performed at [facility], [place] [address] [place], [place] [address]    Report Status PENDING  Incomplete     Terri Piedra, NP [place]  06/08/2020  11:33 AM

## 2020-06-08 NOTE — Progress Notes (Signed)
PHARMACY CONSULT NOTE FOR:  OUTPATIENT  PARENTERAL ANTIBIOTIC THERAPY (OPAT)  Indication: Wound infection Regimen: Cefepime 2g IV q12h End date: 06/19/20  IV antibiotic discharge orders are pended. To discharging provider:  please sign these orders via discharge navigator,  Select New Orders & click on the button choice - Manage This Unsigned Work.     Thank you for allowing pharmacy to be a part of this patient's care.  Rebbeca Paul, PharmD PGY1 Pharmacy Resident 06/08/2020 10:38 AM  Please check [email or web address] for unit-specific pharmacy phone numbers.

## 2020-06-08 NOTE — Progress Notes (Signed)
   Providing Compassionate, Quality Care - Together   Subjective: Patient reports he is feeling much better. His wife is at the bedside. She reports is appetite is improved.  Objective: Vital signs in last 24 hours: Temp:  [97.8 F (36.6 C)-98 F (36.7 C)] 97.8 F (36.6 C) (05/12 0841) Pulse Rate:  [70-90] 90 (05/12 0841) Resp:  [16-21] 19 (05/12 0841) BP: (116-162)/(63-79) 149/63 (05/12 0841) SpO2:  [93 %-97 %] 93 % (05/12 0841)  Intake/Output from previous day: 05/11 0701 - 05/12 0700 In: 950 [P.O.:500; IV Piggyback:450] Out: 1225 [Urine:1100; Drains:125] Intake/Output this shift: No intake/output data recorded.  Alert and oriented x 4 PERRLA CN II-XII grossly intact MAE, Strength and sensation intact Incision with Wound VAC in place, small amount of sanguinous drainage in canister   Lab Results: No results for input(s): WBC, HGB, HCT, PLT in the last 72 hours. BMET Recent Labs    06/07/20 0452 06/08/20 0122  NA 136 135  K 3.6 3.4*  CL 105 105  CO2 23 23  GLUCOSE 96 141*  BUN 16 20  CREATININE 1.47* 1.53*  CALCIUM 8.0* 8.0*     Assessment/Plan: Patient admitted on 05/30/2020 for surgical wound infection following brief stay in SNF. He is three days s/p I & D by Dr. Arnoldo Morale with placement of Wound VAC. Cultures grew Providencia rettgeri. Still awaiting sensitivity, but this should not hold up patient's discharge. PICC line placed yesterday. Once Home Health is in place, patient should be good to discharge home.   LOS: 9 days     Viona Gilmore, DNP, AGNP-C Nurse Practitioner  [facility] [place] [address], [place], [place] [address] P: [phone]    F: [phone]  06/08/2020, 10:36 AM

## 2020-06-09 LAB — GLUCOSE, CAPILLARY
Glucose-Capillary: 125 mg/dL — ABNORMAL HIGH (ref 70–99)
Glucose-Capillary: 138 mg/dL — ABNORMAL HIGH (ref 70–99)

## 2020-06-09 MED ORDER — CYCLOBENZAPRINE HCL 10 MG PO TABS: 10.0000 mg | ORAL_TABLET | Freq: Three times a day (TID) | ORAL | 0 refills | Status: DC | PRN

## 2020-06-09 MED ORDER — CEFEPIME IV (FOR PTA / DISCHARGE USE ONLY): 2.0000 g | Freq: Two times a day (BID) | INTRAVENOUS | 0 refills | Status: AC

## 2020-06-09 MED ORDER — HYDROCODONE-ACETAMINOPHEN 5-325 MG PO TABS: 1.0000 | ORAL_TABLET | ORAL | 0 refills | Status: DC | PRN

## 2020-06-09 NOTE — Discharge Summary (Signed)
Physician Discharge Summary     Providing Compassionate, Quality Care - Together   Patient ID: Jeffery Fernandez MRN: [id number] DOB/AGE: 29-Oct-1945 75 y.o.  Admit date: 05/30/2020 Discharge date: 06/09/2020  Admission Diagnoses: Postoperative wound infection  Discharge Diagnoses:  Principal Problem:   Postoperative wound infection Active Problems:   Delayed surgical wound healing   Discharged Condition: good  Hospital Course: Patient admitted on 05/30/2020 for surgical wound infection following brief stay in SNF. He underwent an I & D by Dr. Arnoldo Morale with placement of Wound VAC on 06/05/2020. Cultures grew Providencia rettgeri, with sensitivity to Cefepime. PICC line placed on 06/08/2020. Home health has been arranged for management of the patient's wound VAC and administration of IV antibiotics. The patient is ready for discharge home.  Consults: ID and rehabilitation medicine  Significant Diagnostic Studies: microbiology: wound culture: positive for Providencia rettgeri  Treatments: surgery: Incision and drainage of wound, wound cultures, placement of wound VAC  Discharge Exam: Blood pressure 125/73, pulse 86, temperature 97.7 F (36.5 C), temperature source Oral, resp. rate 15, height 5' 9.5" (1.765 m), weight 91.2 kg, SpO2 96 %.   Alert and oriented x 4 PERRLA CN II-XII grossly intact MAE, Strength and sensation intact Incision with Wound VAC in place   Disposition: Discharge disposition: 01-Home or Self Care       Discharge Instructions    [facility] pharmacist to adjust dose for Vancomycin, Aminoglycosides and other anti-infective therapies as requested by physician.   Complete by: As directed    [facility] to provide Cath Flo 104m   Complete by: As directed    Administer for PICC line occlusion and as ordered by physician for other access device issues.   Anaphylaxis Kit: Provided to treat any anaphylactic reaction to the medication being  provided to the patient if First Dose or when requested by physician   Complete by: As directed    Epinephrine 141mml vial / amp: Administer 0.71m5m0.71ml671mubcutaneously once for moderate to severe anaphylaxis, nurse to call physician and pharmacy when reaction occurs and call 911 if needed for immediate care   Diphenhydramine 50mg671mIV vial: Administer 25-50mg 37mM PRN for first dose reaction, rash, itching, mild reaction, nurse to call physician and pharmacy when reaction occurs   Sodium Chloride 0.9% NS 500ml I36mdminister if needed for hypovolemic blood pressure drop or as ordered by physician after call to physician with anaphylactic reaction   Change dressing on IV access line weekly and PRN   Complete by: As directed    Flush IV access with Sodium Chloride 0.9% and Heparin 10 units/ml or 100 units/ml   Complete by: As directed    Home infusion instructions - [facility]   Complete by: As directed    Instructions: Flush IV access with Sodium Chloride 0.9% and Heparin 10units/ml or 100units/ml   Change dressing on IV access line: Weekly and PRN   Instructions Cath Flo 2mg: Ad36mister for PICC Line occlusion and as ordered by physician for other access device   [facility] pharmacist to adjust dose for: Vancomycin, Aminoglycosides and other anti-infective therapies as requested by physician   Method of administration may be changed at the discretion of home infusion pharmacist based upon assessment of the patient and/or caregiver's ability to self-administer the medication ordered   Complete by: As directed      Allergies as of 06/09/2020      Reactions   Penicillins Other (See Comments)   Possibly  rash - not sure Has patient had a PCN reaction causing immediate rash, facial/tongue/throat swelling, SOB or lightheadedness with hypotension:unknown Has patient had a PCN reaction causing severe rash involving mucus membranes or skin necrosis: Unknown Has patient had a  PCN reaction that required hospitalization: Unknown Has patient had a PCN reaction occurring within the last 10 years: No Childhood reaction If all of the above answers are "NO", then may proceed with Cephalosporin use.      Medication List    STOP taking these medications   doxycycline 100 MG tablet Commonly known as: ADOXA   oxyCODONE 5 MG immediate release tablet Commonly known as: Oxy IR/ROXICODONE     TAKE these medications   acetaminophen 500 MG tablet Commonly known as: TYLENOL Take 1,000 mg by mouth every 6 (six) hours as needed (for pain.).   amLODipine 5 MG tablet Commonly known as: NORVASC Take 5 mg by mouth daily.   atorvastatin 40 MG tablet Commonly known as: LIPITOR Take 20 mg by mouth at bedtime.   carvedilol 25 MG tablet Commonly known as: COREG TAKE 1 TABLET BY MOUTH 2 TIMES DAILY WITH A MEAL.   ceFEPime  IVPB Commonly known as: MAXIPIME Inject 2 g into the vein every 12 (twelve) hours for 11 days. Indication:  Wound infection First Dose: No Last Day of Therapy:  06/19/20 Labs - Once weekly:  CBC/D and BMP, Labs - Every other week:  ESR and CRP Method of administration: IV Push Method of administration may be changed at the discretion of home infusion pharmacist based upon assessment of the patient and/or caregiver's ability to self-administer the medication ordered.   cyclobenzaprine 10 MG tablet Commonly known as: FLEXERIL Take 1 tablet (10 mg total) by mouth 3 (three) times daily as needed for muscle spasms.   glipiZIDE 10 MG 24 hr tablet Commonly known as: GLUCOTROL XL Take 10 mg by mouth 2 (two) times daily.   hydrochlorothiazide 12.5 MG capsule Commonly known as: MICROZIDE TAKE 1 CAPSULE BY MOUTH EVERY DAY What changed:   how much to take  when to take this   HYDROcodone-acetaminophen 5-325 MG tablet Commonly known as: NORCO/VICODIN Take 1 tablet by mouth every 4 (four) hours as needed for moderate pain.   insulin glargine 100  UNIT/ML injection Commonly known as: LANTUS Inject 40 Units into the skin daily.   lisinopril 40 MG tablet Commonly known as: ZESTRIL TAKE 1 TABLET BY MOUTH EVERY DAY   sertraline 100 MG tablet Commonly known as: ZOLOFT Take 100 mg by mouth daily.   terazosin 5 MG capsule Commonly known as: HYTRIN TAKE 1 CAPSULE BY MOUTH EVERYDAY AT BEDTIME What changed: See the new instructions.            Discharge Care Instructions  (From admission, onward)         Start     Ordered   06/09/20 0000  Change dressing on IV access line weekly and PRN  (Home infusion instructions - [facility] )        06/09/20 1132          Follow-up Information    [facility] [place] Follow up.   Specialty: [place] Why: HHRN/PT/OT arranged for home wound VAC needs and home IV abx (coordination with [facility] pharmacy for IV abx- and [facility] home wound VAC arranged) Contact information: [place] [address] [phone]        Mignon Pine, DO Follow  up.   Specialties: Infectious Diseases, Internal Medicine Why: 06/19/20 at 2:30pm. Please call to reschedule if you are unable to make this appointment.  Contact information: [address] [place] [address] [phone]        Newman Pies, MD. Schedule an appointment as soon as possible for a visit in 3 week(s).   Specialty: Neurosurgery Contact information: [address]  [place] [address] [phone]               Signed: Viona Gilmore, DNP, AGNP-C Nurse Practitioner  [facility] [place] [address], [place], [place] [address] P: [phone]    F: [phone]  06/09/2020, 11:32 AM

## 2020-06-09 NOTE — Progress Notes (Signed)
Pt seen with daughter present for most of session for reinforcement of back precautions related to ADL and IADL. Pt is eager to go home later today.    06/09/20 1400  OT Visit Information  Last OT Received On 06/09/20  Assistance Needed +1  History of Present Illness Pt is a 75 y.o. M admitted 5/3 with decreased appetite nausea, diarrhea, generalized weakness and seosanguineous drainage from his surgical wound. Significant PMH: uncontrolled diabetes, CKD, HTN, L BBB, L3 kyphoplasty, recent L4-5 decompression and fusion 05/11/2020. Discharged to [facility] 05/19/2020 and returned home 05/23/2020.  Precautions  Precautions Back;Fall  Precaution Comments wound vac back, reinforced back precautions during ADL  Required Braces or Orthoses Spinal Brace (for comfort)  Spinal Brace Lumbar corset;Applied in sitting position  Spinal Brace Comments pt choosing not to wear brace  Pain Assessment  Pain Assessment No/denies pain  Pain Intervention(s) Premedicated before session  Cognition  Arousal/Alertness Awake/alert  Behavior During Therapy WFL for tasks assessed/performed  Overall Cognitive Status Impaired/Different from baseline  General Comments some difficulty generalizing back precautions, daughter in room and knowledgeable  ADL  General ADL Comments Per daughter, pt sponge bathed himself today. Pt demonstrated ability to perform LB dressing adhering to back precautions. Reinforced two cup method for oral care.  Bed Mobility  Overal bed mobility Modified Independent  General bed mobility comments no assist or cues to use log roll technique  Balance  Overall balance assessment Needs assistance  Sitting balance-Leahy Scale Good  Sitting balance - Comments no LOB with donning socks  Standing balance-Leahy Scale Fair  Standing balance comment statically  Transfers  Overall transfer level Needs assistance  Equipment used Rolling walker (2 wheeled)  Transfers Sit to/from Stand  Sit to Stand  Supervision  OT - End of Session  Equipment Utilized During Treatment Rolling walker  Activity Tolerance Patient tolerated treatment well  Patient left in bed;with call bell/phone within reach;with family/visitor present  OT Assessment/Plan  OT Plan Discharge plan remains appropriate  OT Visit Diagnosis Unsteadiness on feet (R26.81);Muscle weakness (generalized) (M62.81);Pain  OT Frequency (ACUTE ONLY) Min 2X/week  Follow Up Recommendations Home health OT;Supervision/Assistance - 24 hour  OT Equipment None recommended by OT  AM-PAC OT "6 Clicks" Daily Activity Outcome Measure (Version 2)  Help from another person eating meals? 4  Help from another person taking care of personal grooming? 3  Help from another person toileting, which includes using toliet, bedpan, or urinal? 3  Help from another person bathing (including washing, rinsing, drying)? 4  Help from another person to put on and taking off regular upper body clothing? 4  Help from another person to put on and taking off regular lower body clothing? 4  6 Click Score 22  OT Goal Progression  Progress towards OT goals Progressing toward goals  Acute Rehab OT Goals  Patient Stated Goal to go home today  OT Goal Formulation With patient/family  Time For Goal Achievement 06/15/20  Potential to Achieve Goals Good  OT Time Calculation  OT Start Time (ACUTE ONLY) 1327  OT Stop Time (ACUTE ONLY) 1340  OT Time Calculation (min) 13 min  OT General Charges  $OT Visit 1 Visit  OT Treatments  $Self Care/Home Management  8-22 mins  Nestor Lewandowsky, OTR/L Acute Rehabilitation Services Pager: [phone] Office: [phone]

## 2020-06-09 NOTE — TOC Transition Note (Addendum)
Transition of Care (TOC) - CM/SW Discharge Note Marvetta Gibbons RN,BSN Transitions of Care Unit 4NP (non trauma) - RN Case Manager See Treatment Team for direct Phone #   Patient Details  Name: Jeffery Fernandez MRN: [id number] Date of Birth: 1945/08/02  Transition of Care [facility]) CM/SW Contact:  Dawayne Patricia, RN Phone Number: 06/09/2020, 2:08 PM   Clinical Narrative:    Pt stable for transition home today, per Olivia Mackie with [facility]- they are reaching out to pt/family to speak with them regarding copay and once they have done that [facility] should be approved and will be released for delivery.   14- per daughter she has spoken with [facility] and paid the copay for home VAC- VAC has been approved and should be released soon for delivery- expect VAC to be delivered by this afternoon- trying to confirm ETA with Olivia Mackie at [facility]. Have updated Pam with [facility] as well as Stacie with [facility] to confirm discharge for today. Pam has completed IV abx education and will have PM dose of IV abx delivered to home this evening. Per [facility] will plan on visit -Monday for first home VAC drsg change. Daughter is at bedside and has been updated on [place].   Bedside RN will plan to do Wound VAC drsg change prior to discharge.   1400- confirmed with bedside RN- home wound VAC has been delivered to the room.    Final next level of care: [place] Barriers to Discharge: Barriers Resolved   Patient Goals and CMS Choice Patient states their goals for this hospitalization and ongoing recovery are:: return home CMS Medicare.gov Compare Post Acute Care list provided to:: Patient Represenative (must comment) (daughter) Choice offered to / list presented to : [place]  Discharge Placement                 Home with [facility]      Discharge Plan and Services   Discharge Planning Services: CM Consult Post Acute Care Choice: Durable Medical Equipment,Home Health,Resumption of  Svcs/PTA Provider          DME Arranged: Vac DME Agency: [facility] Date DME Agency Contacted: 06/07/20 Time DME Agency Contacted: 1200 Representative spoke with at DME Agency: Olivia Mackie HH Arranged: RN,PT,OT,Speech Therapy [place] Agency: [facility] Date [place]: 06/07/20 Time [place]: [place] spoke with at [place] Interventions     Readmission Risk Interventions Readmission Risk Prevention Plan 06/09/2020  Post Dischage Appt Complete  Medication Screening Complete  Transportation Screening Complete  Some recent data might be hidden

## 2020-06-09 NOTE — Progress Notes (Signed)
Pharmacy Antibiotic Note  Jeffery Fernandez is a 75 y.o. male admitted on 05/30/2020 from a SNF with a wound infection.  Pharmacy has been consulted for Cefepime dosing.   Renal function stable - current Cefepime dosing remains appropriate. Per ID to continue antibiotics thru 5/23, OPAT done.   Plan: - Continue Cefepime 2g IV every 12 hours  - OPAT done - duration thru 5/23 per ID - Will continue to follow renal function, culture results, LOT, and antibiotic de-escalation plans   Height: 5' 9.5" (176.5 cm) Weight: 91.2 kg (201 lb) IBW/kg (Calculated) : 71.85  Temp (24hrs), Avg:98 F (36.7 C), Min:97.7 F (36.5 C), Max:98.2 F (36.8 C)  Recent Labs  Lab 06/03/20 0208 06/05/20 0420 06/07/20 0452 06/08/20 0122  CREATININE 1.50* 1.27* 1.47* 1.53*    Estimated Creatinine Clearance: 45.5 mL/min (A) (by C-G formula based on SCr of 1.53 mg/dL (H)).    Allergies  Allergen Reactions  . Penicillins Other (See Comments)    Possibly rash - not sure Has patient had a PCN reaction causing immediate rash, facial/tongue/throat swelling, SOB or lightheadedness with hypotension:unknown Has patient had a PCN reaction causing severe rash involving mucus membranes or skin necrosis: Unknown Has patient had a PCN reaction that required hospitalization: Unknown Has patient had a PCN reaction occurring within the last 10 years: No Childhood reaction If all of the above answers are "NO", then may proceed with Cephalosporin use.     Vanc 5/4 >> 5/11 Cefepime 5/10 >> (5/23)  5/3 UC - neg 5/3 cdiff - neg 5/3 BCx - ngtd 5/9 Lumbar wound >> few providencia rettgeri (S-Cefepime)  Thank you for allowing pharmacy to be a part of this patient's care.  Alycia Rossetti, PharmD, BCPS Clinical Pharmacist Clinical phone for 06/09/2020: [phone] 06/09/2020 12:50 PM   **Pharmacist phone directory can now be found on [email or web address] (PW [email or web address]).  Listed under [place].

## 2020-06-09 NOTE — Progress Notes (Signed)
Physical Therapy Treatment Patient Details Name: Jeffery Fernandez MRN: [id number] DOB: 01-09-1946 Today's Date: 06/09/2020    History of Present Illness Pt is a 75 y.o. M admitted 5/3 with decreased appetite nausea, diarrhea, generalized weakness and seosanguineous drainage from his surgical wound. Significant PMH: uncontrolled diabetes, CKD, HTN, L BBB, L3 kyphoplasty, recent L4-5 decompression and fusion 05/11/2020. Discharged to [facility] 05/19/2020 and returned home 05/23/2020.    PT Comments    Pt making good progress with mobility, only needing min guard-supervision for safety with all functional mobility this date. Pt capable of ambulating household distances and entering/exiting his home with min guard-supervision, as he did not display any LOB with these tasks this date. Pt needs cues to remain within RW during turns, utilize hands to push up or reach back for sitting surface with sit <> stand transfers, and cues to maintain spinal precautions with mobility. Will continue to follow acutely. Current recommendations remain appropriate.   Follow Up Recommendations  Home health PT;Supervision/Assistance - 24 hour     Equipment Recommendations  None recommended by PT    Recommendations for Other Services       Precautions / Restrictions Precautions Precautions: Back;Fall Precaution Booklet Issued: Yes (comment) Precaution Comments: wound vac back; cues to maintain, able to recall 3/3 Required Braces or Orthoses: Spinal Brace (for comfort) Spinal Brace: Lumbar corset;Applied in sitting position Spinal Brace Comments: pt does not have to wear the spinal brace. pt reports it is loose since recent wt loss. able to adjust to be supportive previous session Restrictions Weight Bearing Restrictions: No    Mobility  Bed Mobility Overal bed mobility: Needs Assistance Bed Mobility: Rolling;Sidelying to Sit Rolling: Supervision Sidelying to sit: Supervision       General bed mobility comments:  Pt able to come to sit maintaining spinal precautions with supervision.    Transfers Overall transfer level: Needs assistance Equipment used: Rolling walker (2 wheeled) Transfers: Sit to/from Stand Sit to Stand: Supervision         General transfer comment: Supervision for safety, no overt LOB. Cues to push up and reach back for sitting surface with sit <> stand for safety, poor carryover.  Ambulation/Gait Ambulation/Gait assistance: Min guard;Supervision Gait Distance (Feet): 150 Feet (x2 bouts of ~150 ft > ~125 ft) Assistive device: Rolling walker (2 wheeled) Gait Pattern/deviations: Step-through pattern;Decreased stride length;Trunk flexed Gait velocity: reduced Gait velocity interpretation: 1.31 - 2.62 ft/sec, indicative of limited community ambulator General Gait Details: Pt ambulating at increased speed with ability to look around at his surroundings without LOB this date, min guard-supervision. Cues for placement within RW when turning, success.   Stairs Stairs: Yes Stairs assistance: Min guard Stair Management: One rail Right;One rail Left;Step to pattern;Forwards;Sideways Number of Stairs: 4 General stair comments: Ascending with R rail and descending with L, demonstrating step-to pattern, alternating forwards and sideways approaches. No LOB, min guard for safety. Educated pt's daughter on necessity to guard pt on stairs.   Wheelchair Mobility    Modified Rankin (Stroke Patients Only)       Balance Overall balance assessment: Needs assistance Sitting-balance support: Feet supported;No upper extremity supported Sitting balance-Leahy Scale: Good     Standing balance support: Bilateral upper extremity supported;During functional activity;No upper extremity supported Standing balance-Leahy Scale: Fair Standing balance comment: Able to release RW for brief period for static standing, otherwise 1-2 UE support for mobility.  Cognition Arousal/Alertness: Awake/alert Behavior During Therapy: WFL for tasks assessed/performed Overall Cognitive Status: Impaired/Different from baseline Area of Impairment: Memory;Following commands                     Memory: Decreased short-term memory;Decreased recall of precautions Following Commands: Follows one step commands with increased time       General Comments: Pt forgetting cues for placement in RW, particularly with turning. Needs cues on occasion to maintain spinal precautions.      Exercises      General Comments        Pertinent Vitals/Pain Pain Assessment: Faces Faces Pain Scale: Hurts little more Pain Location: back, surgical site Pain Descriptors / Indicators: Discomfort;Grimacing;Guarding Pain Intervention(s): Limited activity within patient's tolerance;Monitored during session;Repositioned    Home Living                      Prior Function            PT Goals (current goals can now be found in the care plan section) Acute Rehab PT Goals Patient Stated Goal: to go home PT Goal Formulation: With patient/family Time For Goal Achievement: 06/14/20 Potential to Achieve Goals: Good Progress towards PT goals: Progressing toward goals    Frequency    Min 3X/week      PT Plan Current plan remains appropriate    Co-evaluation              AM-PAC PT "6 Clicks" Mobility   Outcome Measure  Help needed turning from your back to your side while in a flat bed without using bedrails?: A Little Help needed moving from lying on your back to sitting on the side of a flat bed without using bedrails?: A Little Help needed moving to and from a bed to a chair (including a wheelchair)?: A Little Help needed standing up from a chair using your arms (e.g., wheelchair or bedside chair)?: A Little Help needed to walk in hospital room?: A Little Help needed climbing 3-5 steps with a railing? : A Little 6 Click Score: 18    End  of Session Equipment Utilized During Treatment: Gait belt Activity Tolerance: Patient tolerated treatment well Patient left: with call bell/phone within reach;with family/visitor present;in bed;with bed alarm set Nurse Communication: Mobility status PT Visit Diagnosis: Unsteadiness on feet (R26.81);Muscle weakness (generalized) (M62.81);History of falling (Z91.81);Other abnormalities of gait and mobility (R26.89);Difficulty in walking, not elsewhere classified (R26.2)     Time: 0211-1735 PT Time Calculation (min) (ACUTE ONLY): 22 min  Charges:  $Gait Training: 8-22 mins                     Moishe Spice, PT, DPT Acute Rehabilitation Services  Pager: [phone] Office: [place] 06/09/2020, 12:12 PM

## 2020-06-11 LAB — AEROBIC/ANAEROBIC CULTURE W GRAM STAIN (SURGICAL/DEEP WOUND): Gram Stain: NONE SEEN

## 2020-06-19 ENCOUNTER — Ambulatory Visit: Payer: Medicare HMO | Admitting: Internal Medicine

## 2020-06-19 ENCOUNTER — Telehealth: Payer: Self-pay

## 2020-06-19 ENCOUNTER — Encounter: Payer: Self-pay | Admitting: Internal Medicine

## 2020-06-19 ENCOUNTER — Other Ambulatory Visit: Payer: Self-pay

## 2020-06-19 VITALS — BP 133/70 | HR 70 | Temp 98.4°F | Wt 196.0 lb

## 2020-06-19 DIAGNOSIS — T8149XA Infection following a procedure, other surgical site, initial encounter: Secondary | ICD-10-CM | POA: Diagnosis not present

## 2020-06-19 NOTE — Progress Notes (Signed)
[place] for Infectious Disease  Reason for Consult: Hospital follow up post-op wound infection  Referring Provider: NSGY   HPI:    Jeffery Fernandez is a 75 y.o. male with PMHx as below who presents to the clinic for hospital follow-up of postoperative wound infection.    Patient was recently hospitalized 05/30/2020 through 06/09/2020 for surgical wound infection following brief stay at [facility]  He had initial L3-L4 decompression with hardware placement on 05/11/2020 with neurosurgery.  He was transferred to a skilled nursing facility on 4/22 but ultimately left on 4/26 after he and his family were unsatisfied with the care.  He noted new onset drainage from his wound when he was discharged home.  Dressing changes were initiated and started on oral doxycycline 4/28.  He stopped taking the medication and was subsequently admitted on 5/3 with vancomycin started 5/4.  He received 6 days of empiric antibiotics before being brought to the OR on 5/9 for incision and drainage with wound VAC placement.  Operative report states they encountered thin liquid and exudate with the drainage that did not appear to go below the muscle/fascia.  His surgical PCR screen was negative for MRSA.  Post incision and drainage he was continued on vancomycin and cefepime was added.  NSGY proceedwith PICC line placement.  His OR cultures subsequently grew out Providencia rettgeri.  He was discharged on cefepime 2 g every 12 hours via PICC line through today 06/19/2020.  Since hospital discharge patient reports that he has been doing well.  He has been taking cefepime every 12 hours via his PICC line which has been functioning without issues.  He is scheduled to see his neurosurgeon next week for follow-up.  He has had no fevers or chills.  His wound VAC dressing was changed earlier today and has had minimal output recently.  He has a little bit of skin irritation around his dressing adhesive but no significant pain regarding  his surgical site.  He otherwise has noted mild nausea in the morning which seems to improve with breakfast as well as a little bit of swelling in his left foot compared to the right but no significant lower extremity edema or calf tenderness.  Recent OP AT labs reviewed from 5/16 with creatinine 1.43 (consistent with baseline)  Patient's Medications  New Prescriptions   No medications on file  Previous Medications   ACETAMINOPHEN (TYLENOL) 500 MG TABLET    Take 1,000 mg by mouth every 6 (six) hours as needed (for pain.).   AMLODIPINE (NORVASC) 5 MG TABLET    Take 5 mg by mouth daily.   ATORVASTATIN (LIPITOR) 40 MG TABLET    Take 20 mg by mouth at bedtime.    CARVEDILOL (COREG) 25 MG TABLET    TAKE 1 TABLET BY MOUTH 2 TIMES DAILY WITH A MEAL.   CEFEPIME (MAXIPIME) IVPB    Inject 2 g into the vein every 12 (twelve) hours for 11 days. Indication:  Wound infection First Dose: No Last Day of Therapy:  06/19/20 Labs - Once weekly:  CBC/D and BMP, Labs - Every other week:  ESR and CRP Method of administration: IV Push Method of administration may be changed at the discretion of home infusion pharmacist based upon assessment of the patient and/or caregiver's ability to self-administer the medication ordered.   CYCLOBENZAPRINE (FLEXERIL) 10 MG TABLET    Take 1 tablet (10 mg total) by mouth 3 (three) times daily as needed for muscle spasms.  GLIPIZIDE (GLUCOTROL XL) 10 MG 24 HR TABLET    Take 10 mg by mouth 2 (two) times daily.   HYDROCHLOROTHIAZIDE (MICROZIDE) 12.5 MG CAPSULE    TAKE 1 CAPSULE BY MOUTH EVERY DAY   HYDROCODONE-ACETAMINOPHEN (NORCO/VICODIN) 5-325 MG TABLET    Take 1 tablet by mouth every 4 (four) hours as needed for moderate pain.   INSULIN GLARGINE (LANTUS) 100 UNIT/ML INJECTION    Inject 40 Units into the skin daily.   LISINOPRIL (ZESTRIL) 40 MG TABLET    TAKE 1 TABLET BY MOUTH EVERY DAY   SERTRALINE (ZOLOFT) 100 MG TABLET    Take 100 mg by mouth daily.   TERAZOSIN (HYTRIN) 5 MG  CAPSULE    TAKE 1 CAPSULE BY MOUTH EVERYDAY AT BEDTIME  Modified Medications   No medications on file  Discontinued Medications   No medications on file      Past Medical History:  Diagnosis Date  . Arrhythmia   . Arthritis   . Cataract    bilateral cataract extraction with intraoccular lens implants  . Chest pain, unspecified   . Chronic airway obstruction, not elsewhere classified    PT. DENIES HE HAS COPD  . CKD (chronic kidney disease)   . Colon polyps    Adenomatous Polyps 2007  . Depression   . Diabetes mellitus   . Diverticulosis   . Gout   . Heart murmur   . History of kidney stones   . Hyperlipemia   . Hypertension   . Nephrolithiasis   . Normocytic anemia 04/23/2017  . Obstructive sleep apnea (adult) (pediatric)   . Other and unspecified hyperlipidemia   . Other left bundle branch block   . Retinal vascular occlusion, unspecified    right  . Shortness of breath    on exertion  . Unspecified sleep apnea   . Vision loss of right eye   . White coat syndrome with diagnosis of hypertension 09/19/2017    Social History   Tobacco Use  . Smoking status: Former Smoker    Packs/day: 1.50    Years: 25.00    Pack years: 37.50    Types: Cigarettes    Quit date: 01/28/1986    Years since quitting: 34.4  . Smokeless tobacco: Never Used  Vaping Use  . Vaping Use: Never used  Substance Use Topics  . Alcohol use: Not Currently    Comment: Very rare  . Drug use: No    Family History  Problem Relation Age of Onset  . Heart attack Brother 60       heart attack and CHF/ also some form of EP ablation  . Parkinson's disease Brother   . Atrial fibrillation Brother   . Heart disease Father   . Stroke Father   . Uterine cancer Mother   . Arthritis/Rheumatoid Cousin   . Diabetes Other   . Colon cancer Neg Hx   . Stomach cancer Neg Hx   . Liver cancer Neg Hx   . Rectal cancer Neg Hx   . Esophageal cancer Neg Hx     Allergies  Allergen Reactions  . Penicillins  Other (See Comments)    Possibly rash - not sure Has patient had a PCN reaction causing immediate rash, facial/tongue/throat swelling, SOB or lightheadedness with hypotension:unknown Has patient had a PCN reaction causing severe rash involving mucus membranes or skin necrosis: Unknown Has patient had a PCN reaction that required hospitalization: Unknown Has patient had a PCN reaction occurring within the last 10  years: No Childhood reaction If all of the above answers are "NO", then may proceed with Cephalosporin use.     Review of Systems  All other systems reviewed and are negative. except as otherwise noted in HPI.    OBJECTIVE:    Vitals:   06/19/20 1416 06/19/20 1418  BP:  133/70  Pulse:  70  Temp:  98.4 F (36.9 C)  SpO2:  98%  Weight: 196 lb (88.9 kg)      Body mass index is 28.53 kg/m.  Physical Exam Constitutional:      General: He is not in acute distress.    Appearance: Normal appearance.  Pulmonary:     Effort: Pulmonary effort is normal. No respiratory distress.  Musculoskeletal:     Comments: Trace left pedal edema. Right UE PICC in place. Wound vac in place without drainage in cannister.  No TTP of back surrounding his VAC site and no erythema.   Neurological:     General: No focal deficit present.     Mental Status: He is alert and oriented to person, place, and time.  Psychiatric:        Mood and Affect: Mood normal.        Behavior: Behavior normal.      Labs and Microbiology:  CBC Latest Ref Rng & Units 05/30/2020 05/12/2020 05/11/2020  WBC 4.0 - 10.5 K/uL 8.5 8.4 8.8  Hemoglobin 13.0 - 17.0 g/dL 10.2(L) 10.0(L) 11.7(L)  Hematocrit 39.0 - 52.0 % 31.6(L) 29.3(L) 34.8(L)  Platelets 150 - 400 K/uL 274 138(L) 163   CMP Latest Ref Rng & Units 06/08/2020 06/07/2020 06/05/2020  Glucose 70 - 99 mg/dL 141(H) 96 142(H)  BUN 8 - 23 mg/dL '20 16 12  ' Creatinine 0.61 - 1.24 mg/dL 1.53(H) 1.47(H) 1.27(H)  Sodium 135 - 145 mmol/L 135 136 136  Potassium 3.5 -  5.1 mmol/L 3.4(L) 3.6 3.8  Chloride 98 - 111 mmol/L 105 105 110  CO2 22 - 32 mmol/L 23 23 21(L)  Calcium 8.9 - 10.3 mg/dL 8.0(L) 8.0(L) 7.8(L)  Total Protein 6.5 - 8.1 g/dL - - -  Total Bilirubin 0.3 - 1.2 mg/dL - - -  Alkaline Phos 38 - 126 U/L - - -  AST 15 - 41 U/L - - -  ALT 0 - 44 U/L - - -      ASSESSMENT & PLAN:    Postoperative wound infection Patient is status post incision and drainage with wound VAC placement of post surgical wound infection following L3-L4 decompression in April 2022.  Per neurosurgery operative report the infection did not appear to go below the muscle and fascia and was thought to be more superficial in nature.  Cultures grew Providencia rettgeri.  He has been recovering since hospitalization and will complete 2 weeks of IV cefepime from date of his I&D on 06/19/2020 at which time his PICC line will be removed.  He will continue to follow-up with neurosurgery and wound care as needed.  He can follow-up with our clinic as needed at this point.    Raynelle [facility] for Infectious Disease [facility] 06/19/2020, 2:46 PM

## 2020-06-19 NOTE — Patient Instructions (Signed)
Thank you for coming to see me today. It was a pleasure seeing you.  To Do: Marland Kitchen Continue cefepime via picc line through today then home health will have it removed . Follow up with your neurosurgeon as planned . See me as needed  If you have any questions or concerns, please do not hesitate to call the office at [phone].  Take Care,   Jule Ser, DO

## 2020-06-19 NOTE — Assessment & Plan Note (Signed)
Patient is status post incision and drainage with wound VAC placement of post surgical wound infection following L3-L4 decompression in April 2022.  Per neurosurgery operative report the infection did not appear to go below the muscle and fascia and was thought to be more superficial in nature.  Cultures grew Providencia rettgeri.  He has been recovering since hospitalization and will complete 2 weeks of IV cefepime from date of his I&D on 06/19/2020 at which time his PICC line will be removed.  He will continue to follow-up with neurosurgery and wound care as needed.  He can follow-up with our clinic as needed at this point.

## 2020-06-19 NOTE — Telephone Encounter (Signed)
Called and spoke with Stanton Kidney at [facility] , D/C PICC line and antibiotic today 06/19/2020 per Dr.Wallace. Gave verbal okay to pull PICC line on 06/21/20 when [place] nurse is able to go back out.

## 2020-07-05 ENCOUNTER — Encounter (INDEPENDENT_AMBULATORY_CARE_PROVIDER_SITE_OTHER): Payer: Self-pay | Admitting: Ophthalmology

## 2020-07-05 ENCOUNTER — Ambulatory Visit (INDEPENDENT_AMBULATORY_CARE_PROVIDER_SITE_OTHER): Payer: Medicare HMO | Admitting: Ophthalmology

## 2020-07-05 ENCOUNTER — Other Ambulatory Visit: Payer: Self-pay

## 2020-07-05 DIAGNOSIS — H34832 Tributary (branch) retinal vein occlusion, left eye, with macular edema: Secondary | ICD-10-CM | POA: Diagnosis not present

## 2020-07-05 MED ORDER — BEVACIZUMAB 2.5 MG/0.1ML IZ SOSY
2.5000 mg | PREFILLED_SYRINGE | INTRAVITREAL | Status: AC | PRN
  Administered 2020-07-05: 2.5 mg via INTRAVITREAL

## 2020-07-05 NOTE — Progress Notes (Signed)
07/05/2020     CHIEF COMPLAINT Patient presents for Retina Follow Up (9 week fu OS and Avastin OS/Pt states, "I feel like my va is a little more cloudy and overcast OS."/A1C:unknown/LBS:83)   HISTORY OF PRESENT ILLNESS: Jeffery Fernandez is a 75 y.o. male who presents to the clinic today for:   HPI    Retina Follow Up    Diagnosis: CRVO/BRVO   Laterality: left eye   Onset: 9 weeks ago   Severity: mild   Duration: 9 weeks   Course: gradually worsening   Comments: 9 week fu OS and Avastin OS Pt states, "I feel like my va is a little more cloudy and overcast OS." A1C:unknown LBS:83       Last edited by Kendra Opitz, COA on 07/05/2020  9:44 AM. (History)      Referring physician: Lilian Coma., MD No address on file  HISTORICAL INFORMATION:   Selected notes from the [facility]    Lab Results  Component Value Date   HGBA1C 9.3 (H) 05/11/2020     CURRENT MEDICATIONS: No current outpatient medications on file. (Ophthalmic Drugs)   No current facility-administered medications for this visit. (Ophthalmic Drugs)   Current Outpatient Medications (Other)  Medication Sig  . acetaminophen (TYLENOL) 500 MG tablet Take 1,000 mg by mouth every 6 (six) hours as needed (for pain.).  Marland Kitchen amLODipine (NORVASC) 5 MG tablet Take 5 mg by mouth daily.  Marland Kitchen atorvastatin (LIPITOR) 40 MG tablet Take 20 mg by mouth at bedtime.   . carvedilol (COREG) 25 MG tablet TAKE 1 TABLET BY MOUTH 2 TIMES DAILY WITH A MEAL. (Patient taking differently: Take 25 mg by mouth 2 (two) times daily with a meal.)  . cyclobenzaprine (FLEXERIL) 10 MG tablet Take 1 tablet (10 mg total) by mouth 3 (three) times daily as needed for muscle spasms.  Marland Kitchen glipiZIDE (GLUCOTROL XL) 10 MG 24 hr tablet Take 10 mg by mouth 2 (two) times daily.  . hydrochlorothiazide (MICROZIDE) 12.5 MG capsule TAKE 1 CAPSULE BY MOUTH EVERY DAY (Patient taking differently: Take 12.5 mg by mouth 2 (two) times daily.)  .  HYDROcodone-acetaminophen (NORCO/VICODIN) 5-325 MG tablet Take 1 tablet by mouth every 4 (four) hours as needed for moderate pain.  Marland Kitchen insulin glargine (LANTUS) 100 UNIT/ML injection Inject 40 Units into the skin daily.  Marland Kitchen lisinopril (ZESTRIL) 40 MG tablet TAKE 1 TABLET BY MOUTH EVERY DAY (Patient taking differently: Take 40 mg by mouth daily.)  . sertraline (ZOLOFT) 100 MG tablet Take 100 mg by mouth daily.  Marland Kitchen terazosin (HYTRIN) 5 MG capsule TAKE 1 CAPSULE BY MOUTH EVERYDAY AT BEDTIME (Patient taking differently: Take 5 mg by mouth at bedtime.)   No current facility-administered medications for this visit. (Other)      REVIEW OF SYSTEMS:    ALLERGIES Allergies  Allergen Reactions  . Penicillins Other (See Comments)    Possibly rash - not sure Has patient had a PCN reaction causing immediate rash, facial/tongue/throat swelling, SOB or lightheadedness with hypotension:unknown Has patient had a PCN reaction causing severe rash involving mucus membranes or skin necrosis: Unknown Has patient had a PCN reaction that required hospitalization: Unknown Has patient had a PCN reaction occurring within the last 10 years: No Childhood reaction If all of the above answers are "NO", then may proceed with Cephalosporin use.     PAST MEDICAL HISTORY Past Medical History:  Diagnosis Date  . Arrhythmia   . Arthritis   .  Cataract    bilateral cataract extraction with intraoccular lens implants  . Chest pain, unspecified   . Chronic airway obstruction, not elsewhere classified    PT. DENIES HE HAS COPD  . CKD (chronic kidney disease)   . Colon polyps    Adenomatous Polyps 2007  . Depression   . Diabetes mellitus   . Diverticulosis   . Gout   . Heart murmur   . History of kidney stones   . Hyperlipemia   . Hypertension   . Nephrolithiasis   . Normocytic anemia 04/23/2017  . Obstructive sleep apnea (adult) (pediatric)   . Other and unspecified hyperlipidemia   . Other left bundle branch  block   . Retinal vascular occlusion, unspecified    right  . Shortness of breath    on exertion  . Unspecified sleep apnea   . Vision loss of right eye   . White coat syndrome with diagnosis of hypertension 09/19/2017   Past Surgical History:  Procedure Laterality Date  . ANTERIOR CERVICAL DECOMP/DISCECTOMY FUSION N/A 06/26/2016   Procedure: Re-Exploration of Cervical Wound;  Surgeon: Kary Kos, MD;  Location: [place];  Service: Neurosurgery;  Laterality: N/A;  . ANTERIOR CERVICAL DECOMP/DISCECTOMY FUSION N/A 06/26/2016   Procedure: ANTERIOR CERVICAL DECOMPRESSION/DISCECTOMY FUSION, INTERBODY PROSTESIS, PLATE, CERVICAL THREE CERVICAL FOUR, CERVICAL FOUR CERVICAL FIVE CERVICAL SIX;  Surgeon: Newman Pies, MD;  Location: [place];  Service: Neurosurgery;  Laterality: N/A;  . APPLICATION OF WOUND VAC  06/05/2020   Procedure: APPLICATION OF WOUND VAC;  Surgeon: Newman Pies, MD;  Location: [place];  Service: Neurosurgery;;  . BACK SURGERY    . CATARACT EXTRACTION Bilateral   . KIDNEY STONE SURGERY    . KYPHOPLASTY N/A 01/19/2018   Procedure: KYPHOPLASTY LUMBAR THREE;  Surgeon: Newman Pies, MD;  Location: [place];  Service: Neurosurgery;  Laterality: N/A;  KYPHOPLASTY LUMBAR THREE  . LUMBAR WOUND DEBRIDEMENT N/A 06/05/2020   Procedure: LUMBAR WOUND DEBRIDEMENT;  Surgeon: Newman Pies, MD;  Location: [place];  Service: Neurosurgery;  Laterality: N/A;    FAMILY HISTORY Family History  Problem Relation Age of Onset  . Heart attack Brother 60       heart attack and CHF/ also some form of EP ablation  . Parkinson's disease Brother   . Atrial fibrillation Brother   . Heart disease Father   . Stroke Father   . Uterine cancer Mother   . Arthritis/Rheumatoid Cousin   . Diabetes Other   . Colon cancer Neg Hx   . Stomach cancer Neg Hx   . Liver cancer Neg Hx   . Rectal cancer Neg Hx   . Esophageal cancer Neg Hx     SOCIAL HISTORY Social History   Tobacco Use  . Smoking status:  Former Smoker    Packs/day: 1.50    Years: 25.00    Pack years: 37.50    Types: Cigarettes    Quit date: 01/28/1986    Years since quitting: 34.4  . Smokeless tobacco: Never Used  Vaping Use  . Vaping Use: Never used  Substance Use Topics  . Alcohol use: Not Currently    Comment: Very rare  . Drug use: No         OPHTHALMIC EXAM:  Base Eye Exam    Visual Acuity (ETDRS)      Right Left   Dist cc HM 20/60 -2   Dist ph cc  20/50   Correction: Glasses       Tonometry (Tonopen,  9:49 AM)      Right Left   Pressure 12 10       Pupils      Pupils Dark Light Shape React APD   Right PERRL 3 3 Round Minimal None   Left PERRL 3 3 Round Minimal None       Visual Fields      Left Right   Restrictions Partial outer inferior nasal deficiency Partial outer superior nasal, inferior nasal deficiencies       Neuro/Psych    Oriented x3: Yes   Mood/Affect: Normal       Dilation    Left eye: 1.0% Mydriacyl, 2.5% Phenylephrine @ 9:49 AM        Slit Lamp and Fundus Exam    External Exam      Right Left   External Normal Normal       Slit Lamp Exam      Right Left   Lids/Lashes Normal Normal   Conjunctiva/Sclera White and quiet White and quiet   Cornea Clear Clear   Anterior Chamber Deep and quiet Deep and quiet   Iris Round and reactive Round and reactive   Lens Posterior chamber intraocular lens Posterior chamber intraocular lens   Anterior Vitreous Normal Normal       Fundus Exam      Right Left   Posterior Vitreous  Posterior vitreous detachment, Central vitreous floaters   Disc  Normal   C/D Ratio  0.4   Macula  Microaneurysms, Macular thickening,, milld CME, Cystoid macular edema superior to the FAZ,, FROM MACULAR BRVO   Vessels   BRVO,,, superior   Periphery  Normal          IMAGING AND PROCEDURES  Imaging and Procedures for 07/05/20  OCT, Retina - OU - Both Eyes       Right Eye Quality was good. Scan locations included subfoveal. Central Foveal  Thickness: 238.   Left Eye Quality was good. Scan locations included subfoveal. Central Foveal Thickness: 403. Findings include cystoid macular edema.   Notes Superior to the fovea OS, CME still active from macular BRVO at 9-week interval.  Repeat injection today extend interval examination to 8  weeks as patient preparing for back surgery       Intravitreal Injection, Pharmacologic Agent - OS - Left Eye       Time Out 07/05/2020. 10:26 AM. Confirmed correct patient, procedure, site, and patient consented.   Anesthesia Topical anesthesia was used. Anesthetic medications included Akten 3.5%.   Procedure Preparation included Ofloxacin , Tobramycin 0.3%, 10% betadine to eyelids, 5% betadine to ocular surface. A 30 gauge needle was used.   Injection:  2.5 mg Bevacizumab (AVASTIN) 2.5mg /0.90mL SOSY   NDC: 16109-604-54, Lot: [id number]   Route: Intravitreal, Site: Left Eye  Post-op Post injection exam found visual acuity of at least counting fingers. The patient tolerated the procedure well. There were no complications. The patient received written and verbal post procedure care education. Post injection medications were not given.                 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  We will need to repeat injection today Avastin and follow-up next in 8 weeks      ICD-10-CM   1. 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  2.  3.  Ophthalmic Meds Ordered this visit:  Meds ordered this encounter  Medications  . bevacizumab (AVASTIN) SOSY 2.5 mg       Return in about 8 weeks (around 08/30/2020) for dilate, OS, AVASTIN OCT.  There are no Patient Instructions on file for this  visit.   Explained the diagnoses, plan, and follow up with the patient and they expressed understanding.  Patient expressed understanding of the importance of proper follow up care.   Clent Demark Miia Blanks M.D. Diseases & Surgery of the Retina and Vitreous Retina & Diabetic [place] 07/05/20     Abbreviations: M myopia (nearsighted); A astigmatism; H hyperopia (farsighted); P presbyopia; Mrx spectacle prescription;  CTL contact lenses; OD right eye; OS left eye; OU both eyes  XT exotropia; ET esotropia; PEK punctate epithelial keratitis; PEE punctate epithelial erosions; DES dry eye syndrome; MGD meibomian gland dysfunction; ATs artificial tears; PFAT's preservative free artificial tears; [place] nuclear sclerotic cataract; PSC posterior subcapsular cataract; ERM epi-retinal membrane; PVD posterior vitreous detachment; RD retinal detachment; DM diabetes mellitus; DR diabetic retinopathy; NPDR non-proliferative diabetic retinopathy; PDR proliferative diabetic retinopathy; CSME clinically significant macular edema; DME diabetic macular edema; dbh dot blot hemorrhages; CWS cotton wool spot; POAG primary open angle glaucoma; C/D cup-to-disc ratio; HVF humphrey visual field; GVF goldmann visual field; OCT optical coherence tomography; IOP intraocular pressure; BRVO Branch retinal vein occlusion; CRVO central retinal vein occlusion; CRAO central retinal artery occlusion; BRAO branch retinal artery occlusion; RT retinal tear; SB scleral buckle; PPV pars plana vitrectomy; VH Vitreous hemorrhage; PRP panretinal laser photocoagulation; IVK intravitreal kenalog; VMT vitreomacular traction; MH Macular hole;  NVD neovascularization of the disc; NVE neovascularization elsewhere; AREDS age related eye disease study; ARMD age related macular degeneration; POAG primary open angle glaucoma; EBMD epithelial/anterior basement membrane dystrophy; ACIOL anterior chamber intraocular lens; IOL intraocular lens; PCIOL posterior chamber  intraocular lens; Phaco/IOL phacoemulsification with intraocular lens placement; [place] photorefractive keratectomy; LASIK laser assisted in situ keratomileusis; HTN hypertension; DM diabetes mellitus; COPD chronic obstructive pulmonary disease

## 2020-07-05 NOTE — Assessment & Plan Note (Signed)
At 9-week follow-up interval today, CME from macular BRVO has increased.  We will need to repeat injection today Avastin and follow-up next in 8 weeks

## 2020-07-12 DIAGNOSIS — F325 Major depressive disorder, single episode, in full remission: Secondary | ICD-10-CM | POA: Insufficient documentation

## 2020-07-12 DIAGNOSIS — Z8739 Personal history of other diseases of the musculoskeletal system and connective tissue: Secondary | ICD-10-CM | POA: Insufficient documentation

## 2020-07-12 DIAGNOSIS — E1169 Type 2 diabetes mellitus with other specified complication: Secondary | ICD-10-CM | POA: Insufficient documentation

## 2020-07-12 DIAGNOSIS — E119 Type 2 diabetes mellitus without complications: Secondary | ICD-10-CM | POA: Insufficient documentation

## 2020-08-23 ENCOUNTER — Ambulatory Visit (INDEPENDENT_AMBULATORY_CARE_PROVIDER_SITE_OTHER): Payer: Medicare HMO | Admitting: Ophthalmology

## 2020-08-23 ENCOUNTER — Other Ambulatory Visit: Payer: Self-pay

## 2020-08-23 ENCOUNTER — Encounter (INDEPENDENT_AMBULATORY_CARE_PROVIDER_SITE_OTHER): Payer: Self-pay | Admitting: Ophthalmology

## 2020-08-23 DIAGNOSIS — H34832 Tributary (branch) retinal vein occlusion, left eye, with macular edema: Secondary | ICD-10-CM | POA: Diagnosis not present

## 2020-08-23 MED ORDER — BEVACIZUMAB 2.5 MG/0.1ML IZ SOSY
2.5000 mg | PREFILLED_SYRINGE | INTRAVITREAL | Status: AC | PRN
  Administered 2020-08-23: 2.5 mg via INTRAVITREAL

## 2020-08-23 NOTE — Progress Notes (Signed)
08/23/2020     CHIEF COMPLAINT Patient presents for Retina Follow Up (8 week fu OS and Avastin OS/Pt states, "My vision did not do as well this time. It is overall kind of blurry."/A1C:6.0/LbS:191/)   HISTORY OF PRESENT ILLNESS: Jeffery Fernandez is a 75 y.o. male who presents to the clinic today for:   HPI     Retina Follow Up           Diagnosis: CRVO/BRVO   Laterality: left eye   Onset: 8 weeks ago   Severity: mild   Duration: 8 weeks   Course: gradually worsening   Comments: 8 week fu OS and Avastin OS Pt states, "My vision did not do as well this time. It is overall kind of blurry." A1C:6.0 LbS:191        Last edited by Kendra Opitz, COA on 08/23/2020 10:18 AM.      Referring physician: Bernerd Limbo, MD [address] [place],  [place] [address]  HISTORICAL INFORMATION:   Selected notes from the MEDICAL RECORD NUMBER    Lab Results  Component Value Date   HGBA1C 9.3 (H) 05/11/2020     CURRENT MEDICATIONS: No current outpatient medications on file. (Ophthalmic Drugs)   No current facility-administered medications for this visit. (Ophthalmic Drugs)   Current Outpatient Medications (Other)  Medication Sig   acetaminophen (TYLENOL) 500 MG tablet Take 1,000 mg by mouth every 6 (six) hours as needed (for pain.).   amLODipine (NORVASC) 5 MG tablet Take 5 mg by mouth daily.   atorvastatin (LIPITOR) 40 MG tablet Take 20 mg by mouth at bedtime.    carvedilol (COREG) 25 MG tablet TAKE 1 TABLET BY MOUTH 2 TIMES DAILY WITH A MEAL. (Patient taking differently: Take 25 mg by mouth 2 (two) times daily with a meal.)   cyclobenzaprine (FLEXERIL) 10 MG tablet Take 1 tablet (10 mg total) by mouth 3 (three) times daily as needed for muscle spasms.   glipiZIDE (GLUCOTROL XL) 10 MG 24 hr tablet Take 10 mg by mouth 2 (two) times daily.   hydrochlorothiazide (MICROZIDE) 12.5 MG capsule TAKE 1 CAPSULE BY MOUTH EVERY DAY (Patient taking differently: Take 12.5 mg  by mouth 2 (two) times daily.)   HYDROcodone-acetaminophen (NORCO/VICODIN) 5-325 MG tablet Take 1 tablet by mouth every 4 (four) hours as needed for moderate pain.   insulin glargine (LANTUS) 100 UNIT/ML injection Inject 40 Units into the skin daily.   lisinopril (ZESTRIL) 40 MG tablet TAKE 1 TABLET BY MOUTH EVERY DAY (Patient taking differently: Take 40 mg by mouth daily.)   sertraline (ZOLOFT) 100 MG tablet Take 100 mg by mouth daily.   terazosin (HYTRIN) 5 MG capsule TAKE 1 CAPSULE BY MOUTH EVERYDAY AT BEDTIME (Patient taking differently: Take 5 mg by mouth at bedtime.)   No current facility-administered medications for this visit. (Other)      REVIEW OF SYSTEMS:    ALLERGIES Allergies  Allergen Reactions   Penicillins Other (See Comments)    Possibly rash - not sure Has patient had a PCN reaction causing immediate rash, facial/tongue/throat swelling, SOB or lightheadedness with hypotension:unknown Has patient had a PCN reaction causing severe rash involving mucus membranes or skin necrosis: Unknown Has patient had a PCN reaction that required hospitalization: Unknown Has patient had a PCN reaction occurring within the last 10 years: No Childhood reaction If all of the above answers are "NO", then may proceed with Cephalosporin use.     PAST MEDICAL HISTORY  Past Medical History:  Diagnosis Date   Arrhythmia    Arthritis    Cataract    bilateral cataract extraction with intraoccular lens implants   Chest pain, unspecified    Chronic airway obstruction, not elsewhere classified    PT. DENIES HE HAS COPD   CKD (chronic kidney disease)    Colon polyps    Adenomatous Polyps 2007   Depression    Diabetes mellitus    Diverticulosis    Gout    Heart murmur    History of kidney stones    Hyperlipemia    Hypertension    Nephrolithiasis    Normocytic anemia 04/23/2017   Obstructive sleep apnea (adult) (pediatric)    Other and unspecified hyperlipidemia    Other left  bundle branch block    Retinal vascular occlusion, unspecified    right   Shortness of breath    on exertion   Unspecified sleep apnea    Vision loss of right eye    White coat syndrome with diagnosis of hypertension 09/19/2017   Past Surgical History:  Procedure Laterality Date   ANTERIOR CERVICAL DECOMP/DISCECTOMY FUSION N/A 06/26/2016   Procedure: Re-Exploration of Cervical Wound;  Surgeon: Kary Kos, MD;  Location: [facility];  Service: Neurosurgery;  Laterality: N/A;   ANTERIOR CERVICAL DECOMP/DISCECTOMY FUSION N/A 06/26/2016   Procedure: ANTERIOR CERVICAL DECOMPRESSION/DISCECTOMY FUSION, INTERBODY PROSTESIS, PLATE, CERVICAL THREE CERVICAL FOUR, CERVICAL FOUR CERVICAL FIVE CERVICAL SIX;  Surgeon: Newman Pies, MD;  Location: [facility];  Service: Neurosurgery;  Laterality: N/A;   APPLICATION OF WOUND VAC  06/05/2020   Procedure: APPLICATION OF WOUND VAC;  Surgeon: Newman Pies, MD;  Location: [facility];  Service: Neurosurgery;;   BACK SURGERY     CATARACT EXTRACTION Bilateral    KIDNEY STONE SURGERY     KYPHOPLASTY N/A 01/19/2018   Procedure: KYPHOPLASTY LUMBAR THREE;  Surgeon: Newman Pies, MD;  Location: [facility];  Service: Neurosurgery;  Laterality: N/A;  KYPHOPLASTY LUMBAR THREE   LUMBAR WOUND DEBRIDEMENT N/A 06/05/2020   Procedure: LUMBAR WOUND DEBRIDEMENT;  Surgeon: Newman Pies, MD;  Location: [facility];  Service: Neurosurgery;  Laterality: N/A;    FAMILY HISTORY Family History  Problem Relation Age of Onset   Heart attack Brother 7       heart attack and CHF/ also some form of EP ablation   Parkinson's disease Brother    Atrial fibrillation Brother    Heart disease Father    Stroke Father    Uterine cancer Mother    Arthritis/Rheumatoid Cousin    Diabetes Other    Colon cancer Neg Hx    Stomach cancer Neg Hx    Liver cancer Neg Hx    Rectal cancer Neg Hx    Esophageal cancer Neg Hx     SOCIAL HISTORY Social History   Tobacco Use   Smoking status: Former     Packs/day: 1.50    Years: 25.00    Pack years: 37.50    Types: Cigarettes    Quit date: 01/28/1986    Years since quitting: 34.5   Smokeless tobacco: Never  Vaping Use   Vaping Use: Never used  Substance Use Topics   Alcohol use: Not Currently    Comment: Very rare   Drug use: No         OPHTHALMIC EXAM:  Base Eye Exam     Visual Acuity (ETDRS)       Right Left   Dist cc HM 20/40 +2   Dist  ph cc  NI    Correction: Glasses         Tonometry (Tonopen, 10:22 AM)       Right Left   Pressure 13 13         Pupils       Pupils Dark Light Shape React APD   Right PERRL 3 3 Round Minimal None   Left PERRL 3 3 Round Minimal None         Visual Fields       Left Right   Restrictions Partial outer superior nasal, inferior nasal deficiencies Partial outer superior nasal, inferior nasal deficiencies         Extraocular Movement       Right Left    Full Full         Neuro/Psych     Oriented x3: Yes   Mood/Affect: Normal         Dilation     Left eye: 1.0% Mydriacyl, 2.5% Phenylephrine @ 10:22 AM           Slit Lamp and Fundus Exam     External Exam       Right Left   External Normal Normal         Slit Lamp Exam       Right Left   Lids/Lashes Normal Normal   Conjunctiva/Sclera White and quiet White and quiet   Cornea Clear Clear   Anterior Chamber Deep and quiet Deep and quiet   Iris Round and reactive Round and reactive   Lens Posterior chamber intraocular lens Posterior chamber intraocular lens   Anterior Vitreous Normal Normal         Fundus Exam       Right Left   Posterior Vitreous  Posterior vitreous detachment, Central vitreous floaters   Disc  Normal   C/D Ratio  0.4   Macula  Microaneurysms, , milld CME, Cystoid macular edema superior to the FAZ,, FROM MACULAR BRVO, no macular thickening   Vessels   BRVO,,, superior   Periphery  Normal            IMAGING AND PROCEDURES  Imaging and Procedures for  08/23/20  OCT, Retina - OU - Both Eyes       Right Eye Quality was good. Scan locations included subfoveal. Central Foveal Thickness: 234.   Left Eye Quality was good. Scan locations included subfoveal. Central Foveal Thickness: 291. Findings include cystoid macular edema.   Notes Superior to the fovea OS, much less active CME at 7 weeks postinjection much less active at current interval at 7 weeks.       Intravitreal Injection, Pharmacologic Agent - OS - Left Eye       Time Out 08/23/2020. 11:02 AM. Confirmed correct patient, procedure, site, and patient consented.   Anesthesia Topical anesthesia was used. Anesthetic medications included Akten 3.5%.   Procedure Preparation included Ofloxacin , Tobramycin 0.3%, 10% betadine to eyelids, 5% betadine to ocular surface. A 30 gauge needle was used.   Injection: 2.5 mg bevacizumab 2.5 MG/0.1ML   Route: Intravitreal, Site: Left Eye   NDC: [phone]   Post-op Post injection exam found visual acuity of at least counting fingers. The patient tolerated the procedure well. There were no complications. The patient received written and verbal post procedure care education. Post injection medications were not given.              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  Repeat injection today and examination next again in 7 weeks.      ICD-10-CM   1. 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  Preserved and improved acuity.  Will repeat injection of Avastin injection OS today and follow-up next in 7 weeks.  2.  3.  Ophthalmic Meds Ordered this visit:  Meds  ordered this encounter  Medications   bevacizumab (AVASTIN) SOSY 2.5 mg       Return in about 7 weeks (around 10/11/2020) for dilate, OS, AVASTIN OCT.  There are no Patient Instructions on file for this visit.   Explained the diagnoses, plan, and follow up with the patient and they expressed understanding.  Patient expressed understanding of the importance of proper follow up care.   Clent Demark Chea Malan M.D. Diseases & Surgery of the Retina and Vitreous [facility] 08/23/20     Abbreviations: M myopia (nearsighted); A astigmatism; H hyperopia (farsighted); P presbyopia; Mrx spectacle prescription;  CTL contact lenses; OD right eye; OS left eye; OU both eyes  XT exotropia; ET esotropia; PEK punctate epithelial keratitis; PEE punctate epithelial erosions; DES dry eye syndrome; MGD meibomian gland dysfunction; ATs artificial tears; PFAT's preservative free artificial tears; White nuclear sclerotic cataract; PSC posterior subcapsular cataract; ERM epi-retinal membrane; PVD posterior vitreous detachment; RD retinal detachment; DM diabetes mellitus; DR diabetic retinopathy; NPDR non-proliferative diabetic retinopathy; PDR proliferative diabetic retinopathy; CSME clinically significant macular edema; DME diabetic macular edema; dbh dot blot hemorrhages; CWS cotton wool spot; POAG primary open angle glaucoma; C/D cup-to-disc ratio; HVF humphrey visual field; GVF goldmann visual field; OCT optical coherence tomography; IOP intraocular pressure; BRVO Branch retinal vein occlusion; CRVO central retinal vein occlusion; CRAO central retinal artery occlusion; BRAO branch retinal artery occlusion; RT retinal tear; SB scleral buckle; PPV pars plana vitrectomy; VH Vitreous hemorrhage; PRP panretinal laser photocoagulation; IVK intravitreal kenalog; VMT vitreomacular traction; MH Macular hole;  NVD neovascularization of the disc; NVE neovascularization elsewhere; AREDS age related eye disease study;  ARMD age related macular degeneration; POAG primary open angle glaucoma; EBMD epithelial/anterior basement membrane dystrophy; ACIOL anterior chamber intraocular lens; IOL intraocular lens; PCIOL posterior chamber intraocular lens; Phaco/IOL phacoemulsification with intraocular lens placement; [place] photorefractive keratectomy; LASIK laser assisted in situ keratomileusis; HTN hypertension; DM diabetes mellitus; COPD chronic obstructive pulmonary disease

## 2020-08-23 NOTE — Assessment & Plan Note (Signed)
Much less CME OS superior to the fovea post Avastin at 7-week interval.  Repeat injection today and examination next again in 7 weeks.

## 2020-08-28 ENCOUNTER — Encounter: Payer: Self-pay | Admitting: Gastroenterology

## 2020-08-30 ENCOUNTER — Encounter (INDEPENDENT_AMBULATORY_CARE_PROVIDER_SITE_OTHER): Payer: Medicare HMO | Admitting: Ophthalmology

## 2020-10-08 DIAGNOSIS — M542 Cervicalgia: Secondary | ICD-10-CM | POA: Insufficient documentation

## 2020-10-11 ENCOUNTER — Encounter (INDEPENDENT_AMBULATORY_CARE_PROVIDER_SITE_OTHER): Payer: Medicare HMO | Admitting: Ophthalmology

## 2020-10-11 DIAGNOSIS — H5440 Blindness, one eye, unspecified eye: Secondary | ICD-10-CM | POA: Insufficient documentation

## 2020-10-12 ENCOUNTER — Encounter (INDEPENDENT_AMBULATORY_CARE_PROVIDER_SITE_OTHER): Payer: Self-pay | Admitting: Ophthalmology

## 2020-10-12 ENCOUNTER — Ambulatory Visit (INDEPENDENT_AMBULATORY_CARE_PROVIDER_SITE_OTHER): Payer: Medicare HMO | Admitting: Ophthalmology

## 2020-10-12 ENCOUNTER — Other Ambulatory Visit: Payer: Self-pay

## 2020-10-12 DIAGNOSIS — H34832 Tributary (branch) retinal vein occlusion, left eye, with macular edema: Secondary | ICD-10-CM | POA: Diagnosis not present

## 2020-10-12 MED ORDER — BEVACIZUMAB 2.5 MG/0.1ML IZ SOSY
2.5000 mg | PREFILLED_SYRINGE | INTRAVITREAL | Status: AC | PRN
  Administered 2020-10-12: 2.5 mg via INTRAVITREAL

## 2020-10-12 NOTE — Assessment & Plan Note (Signed)
OS with CME much improved at 7-week interval post injection Avastin for BRVO.  Repeat injection today and again evaluate in 8 weeks

## 2020-10-12 NOTE — Progress Notes (Signed)
10/12/2020     CHIEF COMPLAINT Patient presents for  Chief Complaint  Patient presents with   Retina Follow Up    8 week fu OS and Avastin OS Pt states, "My vision did not do as well this time. It is overall kind of blurry." A1C:6.0 LbS:191       HISTORY OF PRESENT ILLNESS: Jeffery Fernandez is a 75 y.o. male who presents to the clinic today for:   HPI     Retina Follow Up   Patient presents with  CRVO/BRVO.  In left eye.  This started 7 weeks ago.  Severity is mild.  Duration of 7 weeks.  Since onset it is gradually worsening. Additional comments: 8 week fu OS and Avastin OS Pt states, "My vision did not do as well this time. It is overall kind of blurry." A1C:6.0 LbS:191         Comments   7 weeks fu os oct avastin os. Patient states vision is stable and unchanged since last visit. Denies any new floaters or FOL.        Last edited by Laurin Coder on 10/12/2020 10:34 AM.      Referring physician: Bernerd Limbo, MD [address] [place],  [place] [address]  HISTORICAL INFORMATION:   Selected notes from the MEDICAL RECORD NUMBER    Lab Results  Component Value Date   HGBA1C 9.3 (H) 05/11/2020     CURRENT MEDICATIONS: No current outpatient medications on file. (Ophthalmic Drugs)   No current facility-administered medications for this visit. (Ophthalmic Drugs)   Current Outpatient Medications (Other)  Medication Sig   acetaminophen (TYLENOL) 500 MG tablet Take 1,000 mg by mouth every 6 (six) hours as needed (for pain.).   amLODipine (NORVASC) 5 MG tablet Take 5 mg by mouth daily.   atorvastatin (LIPITOR) 40 MG tablet Take 20 mg by mouth at bedtime.    carvedilol (COREG) 25 MG tablet TAKE 1 TABLET BY MOUTH 2 TIMES DAILY WITH A MEAL. (Patient taking differently: Take 25 mg by mouth 2 (two) times daily with a meal.)   cyclobenzaprine (FLEXERIL) 10 MG tablet Take 1 tablet (10 mg total) by mouth 3 (three) times daily as needed for muscle  spasms.   glipiZIDE (GLUCOTROL XL) 10 MG 24 hr tablet Take 10 mg by mouth 2 (two) times daily.   hydrochlorothiazide (MICROZIDE) 12.5 MG capsule TAKE 1 CAPSULE BY MOUTH EVERY DAY (Patient taking differently: Take 12.5 mg by mouth 2 (two) times daily.)   HYDROcodone-acetaminophen (NORCO/VICODIN) 5-325 MG tablet Take 1 tablet by mouth every 4 (four) hours as needed for moderate pain.   insulin glargine (LANTUS) 100 UNIT/ML injection Inject 40 Units into the skin daily.   lisinopril (ZESTRIL) 40 MG tablet TAKE 1 TABLET BY MOUTH EVERY DAY (Patient taking differently: Take 40 mg by mouth daily.)   sertraline (ZOLOFT) 100 MG tablet Take 100 mg by mouth daily.   terazosin (HYTRIN) 5 MG capsule TAKE 1 CAPSULE BY MOUTH EVERYDAY AT BEDTIME (Patient taking differently: Take 5 mg by mouth at bedtime.)   No current facility-administered medications for this visit. (Other)      REVIEW OF SYSTEMS:    ALLERGIES Allergies  Allergen Reactions   Penicillins Other (See Comments)    Possibly rash - not sure Has patient had a PCN reaction causing immediate rash, facial/tongue/throat swelling, SOB or lightheadedness with hypotension:unknown Has patient had a PCN reaction causing severe rash involving mucus membranes or skin necrosis:  Unknown Has patient had a PCN reaction that required hospitalization: Unknown Has patient had a PCN reaction occurring within the last 10 years: No Childhood reaction If all of the above answers are "NO", then may proceed with Cephalosporin use.     PAST MEDICAL HISTORY Past Medical History:  Diagnosis Date   Arrhythmia    Arthritis    Cataract    bilateral cataract extraction with intraoccular lens implants   Chest pain, unspecified    Chronic airway obstruction, not elsewhere classified    PT. DENIES HE HAS COPD   CKD (chronic kidney disease)    Colon polyps    Adenomatous Polyps 2007   Depression    Diabetes mellitus    Diverticulosis    Gout    Heart murmur     History of kidney stones    Hyperlipemia    Hypertension    Nephrolithiasis    Normocytic anemia 04/23/2017   Obstructive sleep apnea (adult) (pediatric)    Other and unspecified hyperlipidemia    Other left bundle branch block    Retinal vascular occlusion, unspecified    right   Shortness of breath    on exertion   Unspecified sleep apnea    Vision loss of right eye    White coat syndrome with diagnosis of hypertension 09/19/2017   Past Surgical History:  Procedure Laterality Date   ANTERIOR CERVICAL DECOMP/DISCECTOMY FUSION N/A 06/26/2016   Procedure: Re-Exploration of Cervical Wound;  Surgeon: Kary Kos, MD;  Location: [facility];  Service: Neurosurgery;  Laterality: N/A;   ANTERIOR CERVICAL DECOMP/DISCECTOMY FUSION N/A 06/26/2016   Procedure: ANTERIOR CERVICAL DECOMPRESSION/DISCECTOMY FUSION, INTERBODY PROSTESIS, PLATE, CERVICAL THREE CERVICAL FOUR, CERVICAL FOUR CERVICAL FIVE CERVICAL SIX;  Surgeon: Newman Pies, MD;  Location: [facility];  Service: Neurosurgery;  Laterality: N/A;   APPLICATION OF WOUND VAC  06/05/2020   Procedure: APPLICATION OF WOUND VAC;  Surgeon: Newman Pies, MD;  Location: [facility];  Service: Neurosurgery;;   BACK SURGERY     CATARACT EXTRACTION Bilateral    KIDNEY STONE SURGERY     KYPHOPLASTY N/A 01/19/2018   Procedure: KYPHOPLASTY LUMBAR THREE;  Surgeon: Newman Pies, MD;  Location: [facility];  Service: Neurosurgery;  Laterality: N/A;  KYPHOPLASTY LUMBAR THREE   LUMBAR WOUND DEBRIDEMENT N/A 06/05/2020   Procedure: LUMBAR WOUND DEBRIDEMENT;  Surgeon: Newman Pies, MD;  Location: [facility];  Service: Neurosurgery;  Laterality: N/A;    FAMILY HISTORY Family History  Problem Relation Age of Onset   Heart attack Brother 91       heart attack and CHF/ also some form of EP ablation   Parkinson's disease Brother    Atrial fibrillation Brother    Heart disease Father    Stroke Father    Uterine cancer Mother    Arthritis/Rheumatoid Cousin    Diabetes Other     Colon cancer Neg Hx    Stomach cancer Neg Hx    Liver cancer Neg Hx    Rectal cancer Neg Hx    Esophageal cancer Neg Hx     SOCIAL HISTORY Social History   Tobacco Use   Smoking status: Former    Packs/day: 1.50    Years: 25.00    Pack years: 37.50    Types: Cigarettes    Quit date: 01/28/1986    Years since quitting: 34.7   Smokeless tobacco: Never  Vaping Use   Vaping Use: Never used  Substance Use Topics   Alcohol use: Not Currently  Comment: Very rare   Drug use: No         OPHTHALMIC EXAM:  Base Eye Exam     Visual Acuity (ETDRS)       Right Left   Dist cc HM 20/30 -2         Tonometry (Tonopen, 10:38 AM)       Right Left   Pressure 10 8         Pupils       Pupils Dark Light React APD   Right PERRL 4 3 Slow None   Left PERRL 3 3 Minimal None         Visual Fields (Counting fingers)       Left Right   Restrictions Partial outer inferior nasal deficiency Partial outer inferior temporal, inferior nasal deficiencies         Extraocular Movement       Right Left    Full Full         Neuro/Psych     Oriented x3: Yes   Mood/Affect: Normal         Dilation     Left eye: 1.0% Mydriacyl, 2.5% Phenylephrine @ 10:38 AM           Slit Lamp and Fundus Exam     External Exam       Right Left   External Normal Normal         Slit Lamp Exam       Right Left   Lids/Lashes Normal Normal   Conjunctiva/Sclera White and quiet White and quiet   Cornea Clear Clear   Anterior Chamber Deep and quiet Deep and quiet   Iris Round and reactive Round and reactive   Lens Posterior chamber intraocular lens Posterior chamber intraocular lens   Anterior Vitreous Normal Normal         Fundus Exam       Right Left   Posterior Vitreous  Posterior vitreous detachment, Central vitreous floaters   Disc  Normal   C/D Ratio  0.4   Macula  Microaneurysms, , milld CME, Cystoid macular edema superior to the FAZ,, FROM MACULAR BRVO,  no macular thickening   Vessels   BRVO,,, superior   Periphery  Normal            IMAGING AND PROCEDURES  Imaging and Procedures for 10/12/20  OCT, Retina - OU - Both Eyes       Right Eye Quality was good. Scan locations included subfoveal. Central Foveal Thickness: 234. Progression has been stable.   Left Eye Quality was good. Scan locations included subfoveal. Central Foveal Thickness: 275. Progression has improved. Findings include cystoid macular edema, abnormal foveal contour.   Notes Superior to the fovea OS, much less active CME at 7 weeks postinjection much less active at current interval at 7 weeks.       Intravitreal Injection, Pharmacologic Agent - OS - Left Eye       Time Out 10/12/2020. 10:59 AM. Confirmed correct patient, procedure, site, and patient consented.   Anesthesia Topical anesthesia was used. Anesthetic medications included [place] 3.5%.   Procedure Preparation included Ofloxacin , Tobramycin 0.3%, 10% betadine to eyelids, 5% betadine to ocular surface. A 30 gauge needle was used.   Injection: 2.5 mg bevacizumab 2.5 MG/0.1ML   Route: Intravitreal, Site: Left Eye   NDC: [phone]   Post-op Post injection exam found visual acuity of at least counting fingers. The patient tolerated the procedure well. There were  no complications. The patient received written and verbal post procedure care education. Post injection medications were not given.              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  Repeat injection today and again evaluate in 8 weeks      ICD-10-CM   1. 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  2.  3.  Ophthalmic Meds Ordered this  visit:  Meds ordered this encounter  Medications   bevacizumab (AVASTIN) SOSY 2.5 mg       Return in about 8 weeks (around 12/07/2020) for dilate, OS, AVASTIN OCT.  There are no Patient Instructions on file for this visit.   Explained the diagnoses, plan, and follow up with the patient and they expressed understanding.  Patient expressed understanding of the importance of proper follow up care.   Clent Demark Teretha Chalupa M.D. Diseases & Surgery of the Retina and Vitreous Retina & Diabetic [facility] 10/12/20     Abbreviations: M myopia (nearsighted); A astigmatism; H hyperopia (farsighted); P presbyopia; Mrx spectacle prescription;  CTL contact lenses; OD right eye; OS left eye; OU both eyes  XT exotropia; ET esotropia; PEK punctate epithelial keratitis; PEE punctate epithelial erosions; DES dry eye syndrome; MGD meibomian gland dysfunction; ATs artificial tears; PFAT's preservative free artificial tears; [place] nuclear sclerotic cataract; PSC posterior subcapsular cataract; ERM epi-retinal membrane; PVD posterior vitreous detachment; RD retinal detachment; DM diabetes mellitus; DR diabetic retinopathy; NPDR non-proliferative diabetic retinopathy; PDR proliferative diabetic retinopathy; CSME clinically significant macular edema; DME diabetic macular edema; dbh dot blot hemorrhages; CWS cotton wool spot; POAG primary open angle glaucoma; C/D cup-to-disc ratio; HVF humphrey visual field; GVF goldmann visual field; OCT optical coherence tomography; IOP intraocular pressure; BRVO Branch retinal vein occlusion; CRVO central retinal vein occlusion; CRAO central retinal artery occlusion; BRAO branch retinal artery occlusion; RT retinal tear; SB scleral buckle; PPV pars plana vitrectomy; VH Vitreous hemorrhage; PRP panretinal laser photocoagulation; IVK intravitreal kenalog; VMT vitreomacular traction; MH Macular hole;  NVD neovascularization of the disc; NVE neovascularization elsewhere; AREDS age related eye  disease study; ARMD age related macular degeneration; POAG primary open angle glaucoma; EBMD epithelial/anterior basement membrane dystrophy; ACIOL anterior chamber intraocular lens; IOL intraocular lens; PCIOL posterior chamber intraocular lens; Phaco/IOL phacoemulsification with intraocular lens placement; [place] photorefractive keratectomy; LASIK laser assisted in situ keratomileusis; HTN hypertension; DM diabetes mellitus; COPD chronic obstructive pulmonary disease

## 2020-12-07 ENCOUNTER — Ambulatory Visit (INDEPENDENT_AMBULATORY_CARE_PROVIDER_SITE_OTHER): Payer: Medicare HMO | Admitting: Ophthalmology

## 2020-12-07 ENCOUNTER — Other Ambulatory Visit: Payer: Self-pay

## 2020-12-07 ENCOUNTER — Encounter (INDEPENDENT_AMBULATORY_CARE_PROVIDER_SITE_OTHER): Payer: Self-pay | Admitting: Ophthalmology

## 2020-12-07 DIAGNOSIS — H34832 Tributary (branch) retinal vein occlusion, left eye, with macular edema: Secondary | ICD-10-CM | POA: Diagnosis not present

## 2020-12-07 MED ORDER — BEVACIZUMAB 2.5 MG/0.1ML IZ SOSY
2.5000 mg | PREFILLED_SYRINGE | INTRAVITREAL | Status: AC | PRN
  Administered 2020-12-07: 2.5 mg via INTRAVITREAL

## 2020-12-07 NOTE — Progress Notes (Signed)
12/07/2020     CHIEF COMPLAINT Patient presents for  Chief Complaint  Patient presents with   Retina Follow Up      HISTORY OF PRESENT ILLNESS: Jeffery Fernandez is a 75 y.o. male who presents to the clinic today for:   HPI     Retina Follow Up   Patient presents with  CRVO/BRVO.  In left eye.  This started 8 weeks ago.  Duration of 8 weeks.  Since onset it is gradually worsening.        Comments   8 week f/u OS with OCT and possible Avastin OS  Pt c/o vision becoming "foggy," states things seem darker.      Last edited by [name], COA on 12/07/2020  9:19 AM.      Referring physician: Bernerd Limbo, MD [address] [place],  [place] [address]  HISTORICAL INFORMATION:   Selected notes from the MEDICAL RECORD NUMBER    Lab Results  Component Value Date   HGBA1C 9.3 (H) 05/11/2020     CURRENT MEDICATIONS: No current outpatient medications on file. (Ophthalmic Drugs)   No current facility-administered medications for this visit. (Ophthalmic Drugs)   Current Outpatient Medications (Other)  Medication Sig   acetaminophen (TYLENOL) 500 MG tablet Take 1,000 mg by mouth every 6 (six) hours as needed (for pain.).   amLODipine (NORVASC) 5 MG tablet Take 5 mg by mouth daily.   atorvastatin (LIPITOR) 40 MG tablet Take 20 mg by mouth at bedtime.    carvedilol (COREG) 25 MG tablet TAKE 1 TABLET BY MOUTH 2 TIMES DAILY WITH A MEAL. (Patient taking differently: Take 25 mg by mouth 2 (two) times daily with a meal.)   cyclobenzaprine (FLEXERIL) 10 MG tablet Take 1 tablet (10 mg total) by mouth 3 (three) times daily as needed for muscle spasms.   glipiZIDE (GLUCOTROL XL) 10 MG 24 hr tablet Take 10 mg by mouth 2 (two) times daily.   hydrochlorothiazide (MICROZIDE) 12.5 MG capsule TAKE 1 CAPSULE BY MOUTH EVERY DAY (Patient taking differently: Take 12.5 mg by mouth 2 (two) times daily.)   HYDROcodone-acetaminophen (NORCO/VICODIN) 5-325 MG tablet Take 1  tablet by mouth every 4 (four) hours as needed for moderate pain.   insulin glargine (LANTUS) 100 UNIT/ML injection Inject 40 Units into the skin daily.   lisinopril (ZESTRIL) 40 MG tablet TAKE 1 TABLET BY MOUTH EVERY DAY (Patient taking differently: Take 40 mg by mouth daily.)   sertraline (ZOLOFT) 100 MG tablet Take 100 mg by mouth daily.   terazosin (HYTRIN) 5 MG capsule TAKE 1 CAPSULE BY MOUTH EVERYDAY AT BEDTIME (Patient taking differently: Take 5 mg by mouth at bedtime.)   No current facility-administered medications for this visit. (Other)      REVIEW OF SYSTEMS:    ALLERGIES Allergies  Allergen Reactions   Penicillins Other (See Comments)    Possibly rash - not sure Has patient had a PCN reaction causing immediate rash, facial/tongue/throat swelling, SOB or lightheadedness with hypotension:unknown Has patient had a PCN reaction causing severe rash involving mucus membranes or skin necrosis: Unknown Has patient had a PCN reaction that required hospitalization: Unknown Has patient had a PCN reaction occurring within the last 10 years: No Childhood reaction If all of the above answers are "NO", then may proceed with Cephalosporin use.     PAST MEDICAL HISTORY Past Medical History:  Diagnosis Date   Arrhythmia    Arthritis    Cataract  bilateral cataract extraction with intraoccular lens implants   Chest pain, unspecified    Chronic airway obstruction, not elsewhere classified    PT. DENIES HE HAS COPD   CKD (chronic kidney disease)    Colon polyps    Adenomatous Polyps 2007   Depression    Diabetes mellitus    Diverticulosis    Gout    Heart murmur    History of kidney stones    Hyperlipemia    Hypertension    Nephrolithiasis    Normocytic anemia 04/23/2017   Obstructive sleep apnea (adult) (pediatric)    Other and unspecified hyperlipidemia    Other left bundle branch block    Retinal vascular occlusion, unspecified    right   Shortness of breath    on  exertion   Unspecified sleep apnea    Vision loss of right eye    White coat syndrome with diagnosis of hypertension 09/19/2017   Past Surgical History:  Procedure Laterality Date   ANTERIOR CERVICAL DECOMP/DISCECTOMY FUSION N/A 06/26/2016   Procedure: Re-Exploration of Cervical Wound;  Surgeon: Kary Kos, MD;  Location: [facility];  Service: Neurosurgery;  Laterality: N/A;   ANTERIOR CERVICAL DECOMP/DISCECTOMY FUSION N/A 06/26/2016   Procedure: ANTERIOR CERVICAL DECOMPRESSION/DISCECTOMY FUSION, INTERBODY PROSTESIS, PLATE, CERVICAL THREE CERVICAL FOUR, CERVICAL FOUR CERVICAL FIVE CERVICAL SIX;  Surgeon: Newman Pies, MD;  Location: [facility];  Service: Neurosurgery;  Laterality: N/A;   APPLICATION OF WOUND VAC  06/05/2020   Procedure: APPLICATION OF WOUND VAC;  Surgeon: Newman Pies, MD;  Location: [facility];  Service: Neurosurgery;;   BACK SURGERY     CATARACT EXTRACTION Bilateral    KIDNEY STONE SURGERY     KYPHOPLASTY N/A 01/19/2018   Procedure: KYPHOPLASTY LUMBAR THREE;  Surgeon: Newman Pies, MD;  Location: [facility];  Service: Neurosurgery;  Laterality: N/A;  KYPHOPLASTY LUMBAR THREE   LUMBAR WOUND DEBRIDEMENT N/A 06/05/2020   Procedure: LUMBAR WOUND DEBRIDEMENT;  Surgeon: Newman Pies, MD;  Location: [facility];  Service: Neurosurgery;  Laterality: N/A;    FAMILY HISTORY Family History  Problem Relation Age of Onset   Heart attack Brother 76       heart attack and CHF/ also some form of EP ablation   Parkinson's disease Brother    Atrial fibrillation Brother    Heart disease Father    Stroke Father    Uterine cancer Mother    Arthritis/Rheumatoid Cousin    Diabetes Other    Colon cancer Neg Hx    Stomach cancer Neg Hx    Liver cancer Neg Hx    Rectal cancer Neg Hx    Esophageal cancer Neg Hx     SOCIAL HISTORY Social History   Tobacco Use   Smoking status: Former    Packs/day: 1.50    Years: 25.00    Pack years: 37.50    Types: Cigarettes    Quit date: 01/28/1986    Years  since quitting: 34.8   Smokeless tobacco: Never  Vaping Use   Vaping Use: Never used  Substance Use Topics   Alcohol use: Not Currently    Comment: Very rare   Drug use: No         OPHTHALMIC EXAM:  Base Eye Exam     Visual Acuity (ETDRS)       Right Left   Dist cc CF@face  20/32 -2   Dist ph cc  NI    Correction: Glasses         Tonometry (Tonopen, 9:27  AM)       Right Left   Pressure 9 7         Pupils       Dark Light Shape React APD   Right 4 3.5 Round Minimal None   Left 4 3 Round Brisk None         Visual Fields       Left Right    Full Full  OD full to HM        Extraocular Movement       Right Left    Full, Ortho Full, Ortho         Neuro/Psych     Oriented x3: Yes   Mood/Affect: Normal         Dilation     Left eye: 1.0% Mydriacyl, 2.5% Phenylephrine @ 9:27 AM           Slit Lamp and Fundus Exam     External Exam       Right Left   External Normal Normal         Slit Lamp Exam       Right Left   Lids/Lashes Normal Normal   Conjunctiva/Sclera White and quiet White and quiet   Cornea Clear Clear   Anterior Chamber Deep and quiet Deep and quiet   Iris Round and reactive Round and reactive   Lens Posterior chamber intraocular lens Posterior chamber intraocular lens   Anterior Vitreous Normal Normal         Fundus Exam       Right Left   Posterior Vitreous  Posterior vitreous detachment, Central vitreous floaters   Disc  Normal   C/D Ratio  0.4   Macula  Microaneurysms, , mild CME, Cystoid macular edema superior to the FAZ,, FROM MACULAR BRVO, no macular thickening   Vessels   BRVO,,, superior   Periphery  Normal            IMAGING AND PROCEDURES  Imaging and Procedures for 12/07/20  OCT, Retina - OU - Both Eyes       Right Eye Quality was good. Scan locations included subfoveal. Central Foveal Thickness: 237. Progression has been stable.   Left Eye Quality was good. Scan locations  included subfoveal. Central Foveal Thickness: 274. Progression has improved. Findings include cystoid macular edema, abnormal foveal contour.   Notes Superior to the fovea OS, much less active CME at 7 weeks postinjection much less active at current interval at 8 weeks.       Intravitreal Injection, Pharmacologic Agent - OS - Left Eye       Time Out 12/07/2020. 9:48 AM. Confirmed correct patient, procedure, site, and patient consented.   Anesthesia Topical anesthesia was used. Anesthetic medications included Lidocaine 4%.   Procedure Preparation included Ofloxacin , Tobramycin 0.3%, 10% betadine to eyelids, 5% betadine to ocular surface. A 30 gauge needle was used.   Injection: 2.5 mg bevacizumab 2.5 MG/0.1ML   Route: Intravitreal, Site: Left Eye   NDC: [phone], Lot: [id number]   Post-op Post injection exam found visual acuity of at least counting fingers. The patient tolerated the procedure well. There were no complications. The patient received written and verbal post procedure care education. Post injection medications included ocuflox.              ASSESSMENT/PLAN:  No problem-specific Assessment & Plan notes found for this encounter.      ICD-10-CM   1. Branch retinal vein occlusion with macular edema of  left eye  H34.8320 OCT, Retina - OU - Both Eyes    Intravitreal Injection, Pharmacologic Agent - OS - Left Eye    bevacizumab (AVASTIN) SOSY 2.5 mg      1.  2.  3.  Ophthalmic Meds Ordered this visit:  Meds ordered this encounter  Medications   bevacizumab (AVASTIN) SOSY 2.5 mg       Return in about 8 weeks (around 02/01/2021) for DILATE OU, AVASTIN OCT, OS.  There are no Patient Instructions on file for this visit.   Explained the diagnoses, plan, and follow up with the patient and they expressed understanding.  Patient expressed understanding of the importance of proper follow up care.   Clent Demark Priscilla Finklea M.D. Diseases & Surgery of the Retina  and Vitreous Retina & Diabetic [facility] 12/07/20     Abbreviations: M myopia (nearsighted); A astigmatism; H hyperopia (farsighted); P presbyopia; Mrx spectacle prescription;  CTL contact lenses; OD right eye; OS left eye; OU both eyes  XT exotropia; ET esotropia; PEK punctate epithelial keratitis; PEE punctate epithelial erosions; DES dry eye syndrome; MGD meibomian gland dysfunction; ATs artificial tears; PFAT's preservative free artificial tears; [place] nuclear sclerotic cataract; PSC posterior subcapsular cataract; ERM epi-retinal membrane; PVD posterior vitreous detachment; RD retinal detachment; DM diabetes mellitus; DR diabetic retinopathy; NPDR non-proliferative diabetic retinopathy; PDR proliferative diabetic retinopathy; CSME clinically significant macular edema; DME diabetic macular edema; dbh dot blot hemorrhages; CWS cotton wool spot; POAG primary open angle glaucoma; C/D cup-to-disc ratio; HVF humphrey visual field; GVF goldmann visual field; OCT optical coherence tomography; IOP intraocular pressure; BRVO Branch retinal vein occlusion; CRVO central retinal vein occlusion; CRAO central retinal artery occlusion; BRAO branch retinal artery occlusion; RT retinal tear; SB scleral buckle; PPV pars plana vitrectomy; VH Vitreous hemorrhage; PRP panretinal laser photocoagulation; IVK intravitreal kenalog; VMT vitreomacular traction; MH Macular hole;  NVD neovascularization of the disc; NVE neovascularization elsewhere; AREDS age related eye disease study; ARMD age related macular degeneration; POAG primary open angle glaucoma; EBMD epithelial/anterior basement membrane dystrophy; ACIOL anterior chamber intraocular lens; IOL intraocular lens; PCIOL posterior chamber intraocular lens; Phaco/IOL phacoemulsification with intraocular lens placement; [place] photorefractive keratectomy; LASIK laser assisted in situ keratomileusis; HTN hypertension; DM diabetes mellitus; COPD chronic obstructive pulmonary  disease

## 2021-01-07 DIAGNOSIS — S32030A Wedge compression fracture of third lumbar vertebra, initial encounter for closed fracture: Secondary | ICD-10-CM | POA: Insufficient documentation

## 2021-01-10 DIAGNOSIS — N184 Chronic kidney disease, stage 4 (severe): Secondary | ICD-10-CM | POA: Insufficient documentation

## 2021-02-01 ENCOUNTER — Encounter (INDEPENDENT_AMBULATORY_CARE_PROVIDER_SITE_OTHER): Payer: Medicare HMO | Admitting: Ophthalmology

## 2021-02-05 ENCOUNTER — Encounter (INDEPENDENT_AMBULATORY_CARE_PROVIDER_SITE_OTHER): Payer: Self-pay | Admitting: Ophthalmology

## 2021-02-05 ENCOUNTER — Ambulatory Visit (INDEPENDENT_AMBULATORY_CARE_PROVIDER_SITE_OTHER): Payer: Medicare HMO | Admitting: Ophthalmology

## 2021-02-05 ENCOUNTER — Other Ambulatory Visit: Payer: Self-pay

## 2021-02-05 DIAGNOSIS — H34832 Tributary (branch) retinal vein occlusion, left eye, with macular edema: Secondary | ICD-10-CM

## 2021-02-05 DIAGNOSIS — H4720 Unspecified optic atrophy: Secondary | ICD-10-CM | POA: Insufficient documentation

## 2021-02-05 MED ORDER — BEVACIZUMAB 2.5 MG/0.1ML IZ SOSY
2.5000 mg | PREFILLED_SYRINGE | INTRAVITREAL | Status: AC | PRN
  Administered 2021-02-05: 2.5 mg via INTRAVITREAL

## 2021-02-05 NOTE — Assessment & Plan Note (Signed)
Stable no progression 

## 2021-02-05 NOTE — Progress Notes (Signed)
02/05/2021     CHIEF COMPLAINT Patient presents for  Chief Complaint  Patient presents with   Retina Follow Up      HISTORY OF PRESENT ILLNESS: Jeffery Fernandez is a 76 y.o. male who presents to the clinic today for:   HPI     Retina Follow Up           Diagnosis: CRVO/BRVO   Laterality: both eyes   Onset: 8 weeks ago   Severity: mild   Duration: 8 weeks   Course: gradually worsening         Comments   8 week fu OU and OCT and Avastin OS  Pt states, "I feel like everything is cloudy and overcast over my left eye." Pt denies any new FOL or new floaters        Last edited by Kendra Opitz, COA on 02/05/2021  1:33 PM.      Referring physician: Bernerd Limbo, MD [address] [place] [address],  [place] [address]  HISTORICAL INFORMATION:   Selected notes from the MEDICAL RECORD NUMBER    Lab Results  Component Value Date   HGBA1C 9.3 (H) 05/11/2020     CURRENT MEDICATIONS: No current outpatient medications on file. (Ophthalmic Drugs)   No current facility-administered medications for this visit. (Ophthalmic Drugs)   Current Outpatient Medications (Other)  Medication Sig   acetaminophen (TYLENOL) 500 MG tablet Take 1,000 mg by mouth every 6 (six) hours as needed (for pain.).   amLODipine (NORVASC) 5 MG tablet Take 5 mg by mouth daily.   atorvastatin (LIPITOR) 40 MG tablet Take 20 mg by mouth at bedtime.    carvedilol (COREG) 25 MG tablet TAKE 1 TABLET BY MOUTH 2 TIMES DAILY WITH A MEAL. (Patient taking differently: Take 25 mg by mouth 2 (two) times daily with a meal.)   cyclobenzaprine (FLEXERIL) 10 MG tablet Take 1 tablet (10 mg total) by mouth 3 (three) times daily as needed for muscle spasms.   glipiZIDE (GLUCOTROL XL) 10 MG 24 hr tablet Take 10 mg by mouth 2 (two) times daily.   hydrochlorothiazide (MICROZIDE) 12.5 MG capsule TAKE 1 CAPSULE BY MOUTH EVERY DAY (Patient taking differently: Take 12.5 mg by mouth 2 (two) times daily.)    HYDROcodone-acetaminophen (NORCO/VICODIN) 5-325 MG tablet Take 1 tablet by mouth every 4 (four) hours as needed for moderate pain.   insulin glargine (LANTUS) 100 UNIT/ML injection Inject 40 Units into the skin daily.   lisinopril (ZESTRIL) 40 MG tablet TAKE 1 TABLET BY MOUTH EVERY DAY (Patient taking differently: Take 40 mg by mouth daily.)   sertraline (ZOLOFT) 100 MG tablet Take 100 mg by mouth daily.   terazosin (HYTRIN) 5 MG capsule TAKE 1 CAPSULE BY MOUTH EVERYDAY AT BEDTIME (Patient taking differently: Take 5 mg by mouth at bedtime.)   No current facility-administered medications for this visit. (Other)      REVIEW OF SYSTEMS: ROS   Negative for: Constitutional, Gastrointestinal, Neurological, Skin, Genitourinary, Musculoskeletal, HENT, Endocrine, Cardiovascular, Eyes, Respiratory, Psychiatric, Allergic/Imm, Heme/Lymph Last edited by Hurman Horn, MD on 02/05/2021  2:14 PM.       ALLERGIES Allergies  Allergen Reactions   Penicillins Other (See Comments)    Possibly rash - not sure Has patient had a PCN reaction causing immediate rash, facial/tongue/throat swelling, SOB or lightheadedness with hypotension:unknown Has patient had a PCN reaction causing severe rash involving mucus membranes or skin necrosis: Unknown Has patient had a PCN reaction that required  hospitalization: Unknown Has patient had a PCN reaction occurring within the last 10 years: No Childhood reaction If all of the above answers are "NO", then may proceed with Cephalosporin use.     PAST MEDICAL HISTORY Past Medical History:  Diagnosis Date   Arrhythmia    Arthritis    Cataract    bilateral cataract extraction with intraoccular lens implants   Chest pain, unspecified    Chronic airway obstruction, not elsewhere classified    PT. DENIES HE HAS COPD   CKD (chronic kidney disease)    Colon polyps    Adenomatous Polyps 2007   Depression    Diabetes mellitus    Diverticulosis    Gout    Heart  murmur    History of kidney stones    Hyperlipemia    Hypertension    Nephrolithiasis    Normocytic anemia 04/23/2017   Obstructive sleep apnea (adult) (pediatric)    Other and unspecified hyperlipidemia    Other left bundle branch block    Retinal vascular occlusion, unspecified    right   Shortness of breath    on exertion   Unspecified sleep apnea    Vision loss of right eye    White coat syndrome with diagnosis of hypertension 09/19/2017   Past Surgical History:  Procedure Laterality Date   ANTERIOR CERVICAL DECOMP/DISCECTOMY FUSION N/A 06/26/2016   Procedure: Re-Exploration of Cervical Wound;  Surgeon: Kary Kos, MD;  Location: [facility];  Service: Neurosurgery;  Laterality: N/A;   ANTERIOR CERVICAL DECOMP/DISCECTOMY FUSION N/A 06/26/2016   Procedure: ANTERIOR CERVICAL DECOMPRESSION/DISCECTOMY FUSION, INTERBODY PROSTESIS, PLATE, CERVICAL THREE CERVICAL FOUR, CERVICAL FOUR CERVICAL FIVE CERVICAL SIX;  Surgeon: Newman Pies, MD;  Location: [facility];  Service: Neurosurgery;  Laterality: N/A;   APPLICATION OF WOUND VAC  06/05/2020   Procedure: APPLICATION OF WOUND VAC;  Surgeon: Newman Pies, MD;  Location: [facility];  Service: Neurosurgery;;   BACK SURGERY     CATARACT EXTRACTION Bilateral    KIDNEY STONE SURGERY     KYPHOPLASTY N/A 01/19/2018   Procedure: KYPHOPLASTY LUMBAR THREE;  Surgeon: Newman Pies, MD;  Location: [facility];  Service: Neurosurgery;  Laterality: N/A;  KYPHOPLASTY LUMBAR THREE   LUMBAR WOUND DEBRIDEMENT N/A 06/05/2020   Procedure: LUMBAR WOUND DEBRIDEMENT;  Surgeon: Newman Pies, MD;  Location: [facility];  Service: Neurosurgery;  Laterality: N/A;    FAMILY HISTORY Family History  Problem Relation Age of Onset   Heart attack Brother 34       heart attack and CHF/ also some form of EP ablation   Parkinson's disease Brother    Atrial fibrillation Brother    Heart disease Father    Stroke Father    Uterine cancer Mother    Arthritis/Rheumatoid Cousin    Diabetes  Other    Colon cancer Neg Hx    Stomach cancer Neg Hx    Liver cancer Neg Hx    Rectal cancer Neg Hx    Esophageal cancer Neg Hx     SOCIAL HISTORY Social History   Tobacco Use   Smoking status: Former    Packs/day: 1.50    Years: 25.00    Pack years: 37.50    Types: Cigarettes    Quit date: 01/28/1986    Years since quitting: 35.0   Smokeless tobacco: Never  Vaping Use   Vaping Use: Never used  Substance Use Topics   Alcohol use: Not Currently    Comment: Very rare   Drug use: No  OPHTHALMIC EXAM:  Base Eye Exam     Visual Acuity (ETDRS)       Right Left   Dist cc CF at Face 20/40   Dist ph cc NI 20/30 -2    Correction: Glasses         Tonometry (Tonopen, 1:37 PM)       Right Left   Pressure 12 14         Pupils       Pupils Dark Light Shape React APD   Right PERRL 4 4 Round Minimal None   Left PERRL 4 3 Round Brisk None         Visual Fields       Left Right    Full    Restrictions  Partial inner superior temporal, inferior temporal, superior nasal, inferior nasal deficiencies         Extraocular Movement       Right Left    Full Full         Neuro/Psych     Oriented x3: Yes   Mood/Affect: Normal         Dilation     Both eyes: 1.0% Mydriacyl, 2.5% Phenylephrine @ 1:37 PM           Slit Lamp and Fundus Exam     External Exam       Right Left   External Normal Normal         Slit Lamp Exam       Right Left   Lids/Lashes Normal Normal   Conjunctiva/Sclera White and quiet White and quiet   Cornea Clear Clear   Anterior Chamber Deep and quiet Deep and quiet   Iris Round and reactive Round and reactive   Lens Posterior chamber intraocular lens Posterior chamber intraocular lens   Anterior Vitreous Normal Normal         Fundus Exam       Right Left   Posterior Vitreous Posterior vitreous detachment Posterior vitreous detachment, Central vitreous floaters   Disc 1+ Optic disc atrophy, 1+  Pallor 1+ Optic disc atrophy, 1+ Pallor   C/D Ratio 0.4 0.4   Macula Normal Microaneurysms, , mild CME, Cystoid macular edema superior to the FAZ,, FROM MACULAR BRVO, no macular thickening   Vessels Normal BRVO, superior, looks compensated   Periphery Normal Normal            IMAGING AND PROCEDURES  Imaging and Procedures for 02/05/21  OCT, Retina - OU - Both Eyes       Right Eye Quality was good. Scan locations included subfoveal. Central Foveal Thickness: 238. Progression has been stable. Findings include normal foveal contour.   Left Eye Quality was good. Scan locations included subfoveal. Central Foveal Thickness: 270. Progression has improved. Findings include cystoid macular edema, abnormal foveal contour.   Notes Superior to the fovea OS, much less active CME at 8 weeks postinjection much less active at current interval at 8 weeks.       Intravitreal Injection, Pharmacologic Agent - OS - Left Eye       Time Out 02/05/2021. 2:15 PM. Confirmed correct patient, procedure, site, and patient consented.   Anesthesia Topical anesthesia was used. Anesthetic medications included Lidocaine 4%.   Procedure Preparation included Ofloxacin , Tobramycin 0.3%, 10% betadine to eyelids, 5% betadine to ocular surface. A 30 gauge needle was used.   Injection: 2.5 mg bevacizumab 2.5 MG/0.1ML   Route: Intravitreal, Site: Left Eye   NDC: [phone],  Lot: [id number]   Post-op Post injection exam found visual acuity of at least counting fingers. The patient tolerated the procedure well. There were no complications. The patient received written and verbal post procedure care education. Post injection medications included ocuflox.              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  Repeat injection today to maintain in  this monocular patient and extend interval next 10 weeks  Optic disc atrophy, right Stable no progression     ICD-10-CM   1. 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 Optic disc atrophy, right  H47.20       1.  OS, much improved center involved CME from BRVO.  Repeat injection today intravitreal Avastin follow-up with extended interval next of 10 weeks  2.  3.  Ophthalmic Meds Ordered this visit:  Meds ordered this encounter  Medications   bevacizumab (AVASTIN) SOSY 2.5 mg       Return in about 10 weeks (around 04/16/2021) for dilate, OS, AVASTIN OCT.  There are no Patient Instructions on file for this visit.   Explained the diagnoses, plan, and follow up with the patient and they expressed understanding.  Patient expressed understanding of the importance of proper follow up care.   Clent Demark Clelia Trabucco M.D. Diseases & Surgery of the Retina and Vitreous [facility] 02/05/21     Abbreviations: M myopia (nearsighted); A astigmatism; H hyperopia (farsighted); P presbyopia; Mrx spectacle prescription;  CTL contact lenses; OD right eye; OS left eye; OU both eyes  XT exotropia; ET esotropia; PEK punctate epithelial keratitis; PEE punctate epithelial erosions; DES dry eye syndrome; MGD meibomian gland dysfunction; ATs artificial tears; PFAT's preservative free artificial tears; [place] nuclear sclerotic cataract; PSC posterior subcapsular cataract; ERM epi-retinal membrane; PVD posterior vitreous detachment; RD retinal detachment; DM diabetes mellitus; DR diabetic retinopathy; NPDR non-proliferative diabetic retinopathy; PDR proliferative diabetic retinopathy; CSME clinically significant macular edema; DME diabetic macular edema; dbh dot blot hemorrhages; CWS cotton wool spot; POAG primary open angle glaucoma;  C/D cup-to-disc ratio; HVF humphrey visual field; GVF goldmann visual field; OCT optical coherence tomography; IOP intraocular pressure; BRVO Branch retinal vein occlusion; CRVO central retinal vein occlusion; CRAO central retinal artery occlusion; BRAO branch retinal artery occlusion; RT retinal tear; SB scleral buckle; PPV pars plana vitrectomy; VH Vitreous hemorrhage; PRP panretinal laser photocoagulation; IVK intravitreal kenalog; VMT vitreomacular traction; MH Macular hole;  NVD neovascularization of the disc; NVE neovascularization elsewhere; AREDS age related eye disease study; ARMD age related macular degeneration; POAG primary open angle glaucoma; EBMD epithelial/anterior basement membrane dystrophy; ACIOL anterior chamber intraocular lens; IOL intraocular lens; PCIOL posterior chamber intraocular lens; Phaco/IOL phacoemulsification with intraocular lens placement; [place] photorefractive keratectomy; LASIK laser assisted in situ keratomileusis; HTN hypertension; DM diabetes mellitus; COPD chronic obstructive pulmonary disease

## 2021-02-05 NOTE — Assessment & Plan Note (Signed)
OS, compensated BRVO with less sent toward macular edema centrally post Avastin currently at 8-week follow-up interval.  Repeat injection today to maintain in this monocular patient and extend interval next 10 weeks

## 2021-04-16 ENCOUNTER — Encounter (INDEPENDENT_AMBULATORY_CARE_PROVIDER_SITE_OTHER): Payer: Self-pay | Admitting: Ophthalmology

## 2021-04-16 ENCOUNTER — Encounter (INDEPENDENT_AMBULATORY_CARE_PROVIDER_SITE_OTHER): Payer: Medicare HMO | Admitting: Ophthalmology

## 2021-04-16 ENCOUNTER — Other Ambulatory Visit: Payer: Self-pay

## 2021-04-16 ENCOUNTER — Ambulatory Visit (INDEPENDENT_AMBULATORY_CARE_PROVIDER_SITE_OTHER): Payer: Medicare HMO | Admitting: Ophthalmology

## 2021-04-16 DIAGNOSIS — G4733 Obstructive sleep apnea (adult) (pediatric): Secondary | ICD-10-CM

## 2021-04-16 DIAGNOSIS — H34832 Tributary (branch) retinal vein occlusion, left eye, with macular edema: Secondary | ICD-10-CM | POA: Diagnosis not present

## 2021-04-16 DIAGNOSIS — E113392 Type 2 diabetes mellitus with moderate nonproliferative diabetic retinopathy without macular edema, left eye: Secondary | ICD-10-CM | POA: Diagnosis not present

## 2021-04-16 MED ORDER — BEVACIZUMAB 2.5 MG/0.1ML IZ SOSY
2.5000 mg | PREFILLED_SYRINGE | INTRAVITREAL | Status: AC | PRN
  Administered 2021-04-16: 2.5 mg via INTRAVITREAL

## 2021-04-16 NOTE — Assessment & Plan Note (Signed)
Off of CPAP ?

## 2021-04-16 NOTE — Assessment & Plan Note (Signed)
Overall much improved CME from BRVO yet failed extension interval of 10 weeks today with recurrence of macular edema adjacent to FAZ superiorly.  We will repeat injection today follow-up again in 8 weeks. ?

## 2021-04-16 NOTE — Progress Notes (Signed)
? ? ?04/16/2021 ? ?  ? ?CHIEF COMPLAINT ?Patient presents for  ?Chief Complaint  ?Patient presents with  ? Branch Retinal Vein Occlusion  ? ? ? ? ?HISTORY OF PRESENT ILLNESS: ?Jeffery Fernandez is a 76 y.o. male who presents to the clinic today for:  ? ?HPI   ?10 weeks for dilate, OS AVASTIN OCT. ?PT states an overcast on left eye. ?Pt is not on trulicity to maintain blood sugar. ? ?  ? ?Last edited by Silvestre Moment on 04/16/2021  1:30 PM.  ?  ? ? ?Referring physician: ?Bernerd Limbo, MD ?[address] ?[address] ?[place],  [place] [address] ? ?HISTORICAL INFORMATION:  ? ?Selected notes from the [facility] ?  ? ?Lab Results  ?Component Value Date  ? HGBA1C 9.3 (H) 05/11/2020  ?  ? ?CURRENT MEDICATIONS: ?No current outpatient medications on file. (Ophthalmic Drugs)  ? ?No current facility-administered medications for this visit. (Ophthalmic Drugs)  ? ?Current Outpatient Medications (Other)  ?Medication Sig  ? acetaminophen (TYLENOL) 500 MG tablet Take 1,000 mg by mouth every 6 (six) hours as needed (for pain.).  ? amLODipine (NORVASC) 5 MG tablet Take 5 mg by mouth daily.  ? atorvastatin (LIPITOR) 40 MG tablet Take 20 mg by mouth at bedtime.   ? carvedilol (COREG) 25 MG tablet TAKE 1 TABLET BY MOUTH 2 TIMES DAILY WITH A MEAL. (Patient taking differently: Take 25 mg by mouth 2 (two) times daily with a meal.)  ? cyclobenzaprine (FLEXERIL) 10 MG tablet Take 1 tablet (10 mg total) by mouth 3 (three) times daily as needed for muscle spasms.  ? glipiZIDE (GLUCOTROL XL) 10 MG 24 hr tablet Take 10 mg by mouth 2 (two) times daily.  ? hydrochlorothiazide (MICROZIDE) 12.5 MG capsule TAKE 1 CAPSULE BY MOUTH EVERY DAY (Patient taking differently: Take 12.5 mg by mouth 2 (two) times daily.)  ? HYDROcodone-acetaminophen (NORCO/VICODIN) 5-325 MG tablet Take 1 tablet by mouth every 4 (four) hours as needed for moderate pain.  ? insulin glargine (LANTUS) 100 UNIT/ML injection Inject 40 Units into the skin daily.  ? lisinopril (ZESTRIL)  40 MG tablet TAKE 1 TABLET BY MOUTH EVERY DAY (Patient taking differently: Take 40 mg by mouth daily.)  ? sertraline (ZOLOFT) 100 MG tablet Take 100 mg by mouth daily.  ? terazosin (HYTRIN) 5 MG capsule TAKE 1 CAPSULE BY MOUTH EVERYDAY AT BEDTIME (Patient taking differently: Take 5 mg by mouth at bedtime.)  ? ?No current facility-administered medications for this visit. (Other)  ? ? ? ? ?REVIEW OF SYSTEMS: ?ROS   ?Negative for: Constitutional, Gastrointestinal, Neurological, Skin, Genitourinary, Musculoskeletal, HENT, Endocrine, Cardiovascular, Eyes, Respiratory, Psychiatric, Allergic/Imm, Heme/Lymph ?Last edited by Silvestre Moment on 04/16/2021  1:30 PM.  ?  ? ? ? ?ALLERGIES ?Allergies  ?Allergen Reactions  ? Penicillins Other (See Comments)  ?  Possibly rash - not sure ?Has patient had a PCN reaction causing immediate rash, facial/tongue/throat swelling, SOB or lightheadedness with hypotension:unknown ?Has patient had a PCN reaction causing severe rash involving mucus membranes or skin necrosis: Unknown ?Has patient had a PCN reaction that required hospitalization: Unknown ?Has patient had a PCN reaction occurring within the last 10 years: No ?Childhood reaction ?If all of the above answers are "NO", then may proceed with Cephalosporin use. ?  ? ? ?PAST MEDICAL HISTORY ?Past Medical History:  ?Diagnosis Date  ? Arrhythmia   ? Arthritis   ? Cataract   ? bilateral cataract extraction with intraoccular lens implants  ? Chest  pain, unspecified   ? Chronic airway obstruction, not elsewhere classified   ? PT. DENIES HE HAS COPD  ? CKD (chronic kidney disease)   ? Colon polyps   ? Adenomatous Polyps 2007  ? Depression   ? Diabetes mellitus   ? Diverticulosis   ? Gout   ? Heart murmur   ? History of kidney stones   ? Hyperlipemia   ? Hypertension   ? Nephrolithiasis   ? Normocytic anemia 04/23/2017  ? Obstructive sleep apnea (adult) (pediatric)   ? Other and unspecified hyperlipidemia   ? Other left bundle branch block   ? Retinal  vascular occlusion, unspecified   ? right  ? Shortness of breath   ? on exertion  ? Unspecified sleep apnea   ? Vision loss of right eye   ? White coat syndrome with diagnosis of hypertension 09/19/2017  ? ?Past Surgical History:  ?Procedure Laterality Date  ? ANTERIOR CERVICAL DECOMP/DISCECTOMY FUSION N/A 06/26/2016  ? Procedure: Re-Exploration of Cervical Wound;  Surgeon: Kary Kos, MD;  Location: [facility];  Service: Neurosurgery;  Laterality: N/A;  ? ANTERIOR CERVICAL DECOMP/DISCECTOMY FUSION N/A 06/26/2016  ? Procedure: ANTERIOR CERVICAL DECOMPRESSION/DISCECTOMY FUSION, INTERBODY PROSTESIS, PLATE, CERVICAL THREE CERVICAL FOUR, CERVICAL FOUR CERVICAL FIVE CERVICAL SIX;  Surgeon: Newman Pies, MD;  Location: [facility];  Service: Neurosurgery;  Laterality: N/A;  ? APPLICATION OF WOUND VAC  06/05/2020  ? Procedure: APPLICATION OF WOUND VAC;  Surgeon: Newman Pies, MD;  Location: [facility];  Service: Neurosurgery;;  ? BACK SURGERY    ? CATARACT EXTRACTION Bilateral   ? KIDNEY STONE SURGERY    ? KYPHOPLASTY N/A 01/19/2018  ? Procedure: KYPHOPLASTY LUMBAR THREE;  Surgeon: Newman Pies, MD;  Location: [facility];  Service: Neurosurgery;  Laterality: N/A;  KYPHOPLASTY LUMBAR THREE  ? LUMBAR WOUND DEBRIDEMENT N/A 06/05/2020  ? Procedure: LUMBAR WOUND DEBRIDEMENT;  Surgeon: Newman Pies, MD;  Location: [facility];  Service: Neurosurgery;  Laterality: N/A;  ? ? ?FAMILY HISTORY ?Family History  ?Problem Relation Age of Onset  ? Heart attack Brother 65  ?     heart attack and CHF/ also some form of EP ablation  ? Parkinson's disease Brother   ? Atrial fibrillation Brother   ? Heart disease Father   ? Stroke Father   ? Uterine cancer Mother   ? Arthritis/Rheumatoid Cousin   ? Diabetes Other   ? Colon cancer Neg Hx   ? Stomach cancer Neg Hx   ? Liver cancer Neg Hx   ? Rectal cancer Neg Hx   ? Esophageal cancer Neg Hx   ? ? ?SOCIAL HISTORY ?Social History  ? ?Tobacco Use  ? Smoking status: Former  ?  Packs/day: 1.50  ?  Years: 25.00  ?   Pack years: 37.50  ?  Types: Cigarettes  ?  Quit date: 01/28/1986  ?  Years since quitting: 35.2  ? Smokeless tobacco: Never  ?Vaping Use  ? Vaping Use: Never used  ?Substance Use Topics  ? Alcohol use: Not Currently  ?  Comment: Very rare  ? Drug use: No  ? ?  ? ?  ? ?OPHTHALMIC EXAM: ? ?Base Eye Exam   ? ? Visual Acuity (ETDRS)   ? ?   Right Left  ? Dist cc HM 20/30 -2  ? Dist ph cc  20/25 -2  ? ? Correction: Glasses  ? ?  ?  ? ? Tonometry (Tonopen, 1:36 PM)   ? ?   Right Left  ?  Pressure 15 14  ? ?  ?  ? ? Pupils   ? ?   Pupils APD  ? Right PERRL None  ? Left PERRL None  ? ?  ?  ? ? Visual Fields   ? ?   Left Right  ?  Full Full  ? ?  ?  ? ? Extraocular Movement   ? ?   Right Left  ?  Full Full  ? ?  ?  ? ? Neuro/Psych   ? ? Oriented x3: Yes  ? Mood/Affect: Normal  ? ?  ?  ? ? Dilation   ? ? Left eye: 1.0% Mydriacyl, 2.5% Phenylephrine @ 1:36 PM  ? ?  ?  ? ?  ? ?Slit Lamp and Fundus Exam   ? ? External Exam   ? ?   Right Left  ? External Normal Normal  ? ?  ?  ? ? Slit Lamp Exam   ? ?   Right Left  ? Lids/Lashes Normal Normal  ? Conjunctiva/Sclera White and quiet White and quiet  ? Cornea Clear Clear  ? Anterior Chamber Deep and quiet Deep and quiet  ? Iris Round and reactive Round and reactive  ? Lens Posterior chamber intraocular lens Posterior chamber intraocular lens  ? Anterior Vitreous Normal Normal  ? ?  ?  ? ? Fundus Exam   ? ?   Right Left  ? Posterior Vitreous  Posterior vitreous detachment, Central vitreous floaters  ? Disc  Normal  ? C/D Ratio  0.4  ? Macula  Microaneurysms, , mild CME, Cystoid macular edema superior to the FAZ,, FROM MACULAR BRVO, no macular thickening  ? Vessels   BRVO,,, superior  ? Periphery  Normal  ? ?  ?  ? ?  ? ? ?IMAGING AND PROCEDURES  ?Imaging and Procedures for 04/16/21 ? ?OCT, Retina - OU - Both Eyes   ? ?   ?Right Eye ?Quality was good. Scan locations included subfoveal. Central Foveal Thickness: 236. Progression has been stable. Findings include normal foveal contour.   ? ?Left Eye ?Quality was good. Scan locations included subfoveal. Central Foveal Thickness: 296. Progression has improved. Findings include cystoid macular edema, abnormal foveal contour.  ? ?Notes ?[place]

## 2021-04-16 NOTE — Assessment & Plan Note (Signed)
I explained to the patient that noncompliance with the need for use of CPAP is likely to make this condition worse ?

## 2021-05-12 NOTE — Progress Notes (Signed)
? ?  Subjective:  ? ? Patient ID: Jeffery Fernandez, male    DOB: 1945-04-08, 76 y.o.   MRN: [id number] ? ?HPI . ?male followed for OSA, complicated by COPD, CAD, HBP, DM ?NPSG 06/14/10   AHI 57/ hr, desaturation to 83% ? ?============================================================================= ? ?05/11/2015-76 year old male followed for OSA, complicated by COPD, CAD, HBP, DM ?CPAP 10/[facility] ?FOLLOWS FOR: DME is [facility]. Pt states mask is bothering him therefore he has not been using CPAP as he should.  ?Wife is here ?Based on download, he has hardly worn his mask in months because he says it hurts his face. He has not called his DME company about it. Admits he snores more and feels less rested without CPAP. ? ?05/15/21- 75YO M  followed for OSA, complicated by COPD, CAD, HBP, DM, Branch Retinal Vein Occlusion, Spinal Stenosis,  ?CPAP 10/ [facility] ?Download compliance ?Epworth score-9 ?Body weight today-201 lbs ?Covid vax-4 Phizer ?Flu vax-had ?-----Patient is currently using CPAP machine that he has had for several years. Would like a new mask does not like the one he currently has.  ?Machine is old and due for replacement.  He is uncomfortable with mask that leaks considerably.  We discussed replacement of both. ?Daughter is here and they deny other issues.  He has considerable low back pain after complicated surgery. ? ?Review of Systems  ?Constitutional: Negative for fever and unexpected weight change.  ?HENT: Negative for congestion, dental problem, ear pain, nosebleeds, postnasal drip, rhinorrhea, sinus pressure, sneezing, sore throat and trouble swallowing.   ?Eyes: Negative for redness and itching.  ?Respiratory: Negative for cough, chest tightness, shortness of breath and wheezing.   ?Cardiovascular: Negative for palpitations and leg swelling.  ?Gastrointestinal: Negative for nausea and vomiting.  ?Genitourinary: Negative for dysuria.  ?Musculoskeletal: Negative for joint swelling.  ?Skin: Negative for rash.   ?Neurological: Negative for headaches.  ?Hematological: Does not bruise/bleed easily.  ?Psychiatric/Behavioral: Negative for dysphoric mood. The patient is not nervous/anxious.   ?   ?Objective:  ? OBJ- Physical Exam ?General- Alert, Oriented, Affect-appropriate, Distress- none acute,  ?Skin- rash-none, lesions- none, excoriation- none ?Lymphadenopathy- none ?Head- atraumatic ?           Eyes- Gross vision intact, PERRLA, conjunctivae and secretions clear ?           Ears- Hearing, canals-normal ?           Nose- Clear, no-Septal dev, mucus, polyps, erosion, perforation  ?           Throat- Mallampati III-IV , mucosa clear , drainage- none, tonsils- atrophic, + dental repair/many missing teeth/upper plate ?Neck- flexible , trachea midline, no stridor , thyroid nl, carotid no bruit ?Chest - symmetrical excursion , unlabored ?          Heart/CV- RRR , no murmur , no gallop  , no rub, nl s1 s2 ?                          - JVD- none , edema- none, stasis changes- none, varices- none ?          Lung- clear to P&A, wheeze- none, cough- none , dullness-none, rub- none ?          Chest wall-  ?Abd-  ?Br/ Gen/ Rectal- Not done, not indicated ?Extrem- cyanosis- none, clubbing, none, atrophy- none, strength- nl ?Neuro- grossly intact to observation ? ? ? ? ? ?   ?Assessment & Plan:  ? ?

## 2021-05-15 ENCOUNTER — Encounter: Payer: Self-pay | Admitting: Internal Medicine

## 2021-05-15 ENCOUNTER — Ambulatory Visit: Payer: Medicare HMO | Admitting: Internal Medicine

## 2021-05-15 DIAGNOSIS — G4733 Obstructive sleep apnea (adult) (pediatric): Secondary | ICD-10-CM | POA: Diagnosis not present

## 2021-05-15 NOTE — Assessment & Plan Note (Signed)
He needs help restoring functional comfort.  Has benefited from CPAP. ?Plan-replace old CPAP machine, changing to auto 5-15.  Refer for mask fitting. ?

## 2021-05-15 NOTE — Assessment & Plan Note (Signed)
Chronic low back pain and history of complicated surgery.  Followed by appropriate specialists. ?

## 2021-05-15 NOTE — Patient Instructions (Signed)
Order DME [facility]- please replace old CPAP machine change to auto 5-15, mask of choice, humidifier, supplies, AirView/ card ? ?Order- refer to sleep center for mask fitting  ? ? ?

## 2021-06-04 ENCOUNTER — Telehealth: Payer: Self-pay | Admitting: Internal Medicine

## 2021-06-04 DIAGNOSIS — G4733 Obstructive sleep apnea (adult) (pediatric): Secondary | ICD-10-CM

## 2021-06-04 NOTE — Telephone Encounter (Signed)
Orders never placed for replacement CPAP or mask fitting  ?I placed orders  ?Spoke with the pt's daughter and notified that this was done ?Nothing further needed ?

## 2021-06-04 NOTE — Telephone Encounter (Signed)
Patient's daughter called and states patient was suppose to get a new CPAP machine- but they have not heard anything. Call daughter back at [phone] ?

## 2021-06-11 ENCOUNTER — Encounter (INDEPENDENT_AMBULATORY_CARE_PROVIDER_SITE_OTHER): Payer: Medicare HMO | Admitting: Ophthalmology

## 2021-06-13 ENCOUNTER — Ambulatory Visit (INDEPENDENT_AMBULATORY_CARE_PROVIDER_SITE_OTHER): Payer: Medicare HMO | Admitting: Ophthalmology

## 2021-06-13 ENCOUNTER — Encounter (INDEPENDENT_AMBULATORY_CARE_PROVIDER_SITE_OTHER): Payer: Medicare HMO | Admitting: Ophthalmology

## 2021-06-13 ENCOUNTER — Encounter (INDEPENDENT_AMBULATORY_CARE_PROVIDER_SITE_OTHER): Payer: Self-pay | Admitting: Ophthalmology

## 2021-06-13 DIAGNOSIS — H34832 Tributary (branch) retinal vein occlusion, left eye, with macular edema: Secondary | ICD-10-CM | POA: Diagnosis not present

## 2021-06-13 DIAGNOSIS — H47012 Ischemic optic neuropathy, left eye: Secondary | ICD-10-CM

## 2021-06-13 DIAGNOSIS — G4733 Obstructive sleep apnea (adult) (pediatric): Secondary | ICD-10-CM | POA: Diagnosis not present

## 2021-06-13 MED ORDER — BEVACIZUMAB 2.5 MG/0.1ML IZ SOSY
2.5000 mg | PREFILLED_SYRINGE | INTRAVITREAL | Status: AC | PRN
  Administered 2021-06-13: 2.5 mg via INTRAVITREAL

## 2021-06-13 NOTE — Progress Notes (Signed)
? ? ?06/13/2021 ? ?  ? ?CHIEF COMPLAINT ?Patient presents for  ?Chief Complaint  ?Patient presents with  ? Branch Retinal Vein Occlusion  ? ? ? ? ?HISTORY OF PRESENT ILLNESS: ?Jeffery Fernandez is a 76 y.o. male who presents to the clinic today for:  ? ?HPI   ?8 weeks dilate OS, Avastin OS, OCT.  ?Patient states his vision seems more blurred than last visit, and his floaters seem a little bigger. He noticed a change "a few weeks ago." ?Patient wife states "he is doing better wearing his CPAP but I am having an issue with the company." ?Last edited by Laurin Coder on 06/13/2021  1:03 PM.  ?  ? ? ?Referring physician: ?Bernerd Limbo, MD ?[address] ?[address] ?[place],  [place] [address] ? ?HISTORICAL INFORMATION:  ? ?Selected notes from the [facility] ?  ? ?Lab Results  ?Component Value Date  ? HGBA1C 9.3 (H) 05/11/2020  ?  ? ?CURRENT MEDICATIONS: ?No current outpatient medications on file. (Ophthalmic Drugs)  ? ?No current facility-administered medications for this visit. (Ophthalmic Drugs)  ? ?Current Outpatient Medications (Other)  ?Medication Sig  ? acetaminophen (TYLENOL) 500 MG tablet Take 1,000 mg by mouth every 6 (six) hours as needed (for pain.).  ? amLODipine (NORVASC) 5 MG tablet Take 5 mg by mouth daily.  ? aspirin EC 81 MG tablet Take 325 mg by mouth daily. Swallow whole.  ? atorvastatin (LIPITOR) 40 MG tablet Take 20 mg by mouth at bedtime.   ? carvedilol (COREG) 25 MG tablet TAKE 1 TABLET BY MOUTH 2 TIMES DAILY WITH A MEAL. (Patient taking differently: Take 25 mg by mouth 2 (two) times daily with a meal.)  ? Cholecalciferol (D3-50 PO) Take by mouth.  ? glipiZIDE (GLUCOTROL XL) 10 MG 24 hr tablet Take 10 mg by mouth 2 (two) times daily.  ? HYDROcodone-acetaminophen (NORCO/VICODIN) 5-325 MG tablet Take 1 tablet by mouth every 4 (four) hours as needed for moderate pain.  ? insulin glargine (LANTUS) 100 UNIT/ML injection Inject 40 Units into the skin daily.  ? lisinopril (ZESTRIL) 40 MG tablet  TAKE 1 TABLET BY MOUTH EVERY DAY (Patient taking differently: Take 40 mg by mouth daily.)  ? sertraline (ZOLOFT) 100 MG tablet Take 100 mg by mouth daily.  ? terazosin (HYTRIN) 5 MG capsule TAKE 1 CAPSULE BY MOUTH EVERYDAY AT BEDTIME (Patient taking differently: Take 5 mg by mouth at bedtime.)  ? Turmeric (QC TUMERIC COMPLEX) 500 MG CAPS Take by mouth.  ? ?No current facility-administered medications for this visit. (Other)  ? ? ? ? ?REVIEW OF SYSTEMS: ?ROS   ?Negative for: Constitutional, Gastrointestinal, Neurological, Skin, Genitourinary, Musculoskeletal, HENT, Endocrine, Cardiovascular, Eyes, Respiratory, Psychiatric, Allergic/Imm, Heme/Lymph ?Last edited by Hurman Horn, MD on 06/13/2021  1:42 PM.  ?  ? ? ? ?ALLERGIES ?Allergies  ?Allergen Reactions  ? Penicillins Other (See Comments)  ?  Possibly rash - not sure ?Has patient had a PCN reaction causing immediate rash, facial/tongue/throat swelling, SOB or lightheadedness with hypotension:unknown ?Has patient had a PCN reaction causing severe rash involving mucus membranes or skin necrosis: Unknown ?Has patient had a PCN reaction that required hospitalization: Unknown ?Has patient had a PCN reaction occurring within the last 10 years: No ?Childhood reaction ?If all of the above answers are "NO", then may proceed with Cephalosporin use. ?  ? ? ?PAST MEDICAL HISTORY ?Past Medical History:  ?Diagnosis Date  ? Arrhythmia   ? Arthritis   ? Cataract   ?  bilateral cataract extraction with intraoccular lens implants  ? Chest pain, unspecified   ? Chronic airway obstruction, not elsewhere classified   ? PT. DENIES HE HAS COPD  ? CKD (chronic kidney disease)   ? Colon polyps   ? Adenomatous Polyps 2007  ? Depression   ? Diabetes mellitus   ? Diverticulosis   ? Gout   ? Heart murmur   ? History of kidney stones   ? Hyperlipemia   ? Hypertension   ? Nephrolithiasis   ? Normocytic anemia 04/23/2017  ? Obstructive sleep apnea (adult) (pediatric)   ? Other and unspecified  hyperlipidemia   ? Other left bundle branch block   ? Retinal vascular occlusion, unspecified   ? right  ? Shortness of breath   ? on exertion  ? Unspecified sleep apnea   ? Vision loss of right eye   ? White coat syndrome with diagnosis of hypertension 09/19/2017  ? ?Past Surgical History:  ?Procedure Laterality Date  ? ANTERIOR CERVICAL DECOMP/DISCECTOMY FUSION N/A 06/26/2016  ? Procedure: Re-Exploration of Cervical Wound;  Surgeon: Kary Kos, MD;  Location: [facility];  Service: Neurosurgery;  Laterality: N/A;  ? ANTERIOR CERVICAL DECOMP/DISCECTOMY FUSION N/A 06/26/2016  ? Procedure: ANTERIOR CERVICAL DECOMPRESSION/DISCECTOMY FUSION, INTERBODY PROSTESIS, PLATE, CERVICAL THREE CERVICAL FOUR, CERVICAL FOUR CERVICAL FIVE CERVICAL SIX;  Surgeon: Newman Pies, MD;  Location: [facility];  Service: Neurosurgery;  Laterality: N/A;  ? APPLICATION OF WOUND VAC  06/05/2020  ? Procedure: APPLICATION OF WOUND VAC;  Surgeon: Newman Pies, MD;  Location: [facility];  Service: Neurosurgery;;  ? BACK SURGERY    ? CATARACT EXTRACTION Bilateral   ? KIDNEY STONE SURGERY    ? KYPHOPLASTY N/A 01/19/2018  ? Procedure: KYPHOPLASTY LUMBAR THREE;  Surgeon: Newman Pies, MD;  Location: [facility];  Service: Neurosurgery;  Laterality: N/A;  KYPHOPLASTY LUMBAR THREE  ? LUMBAR WOUND DEBRIDEMENT N/A 06/05/2020  ? Procedure: LUMBAR WOUND DEBRIDEMENT;  Surgeon: Newman Pies, MD;  Location: [facility];  Service: Neurosurgery;  Laterality: N/A;  ? ? ?FAMILY HISTORY ?Family History  ?Problem Relation Age of Onset  ? Heart attack Brother 49  ?     heart attack and CHF/ also some form of EP ablation  ? Parkinson's disease Brother   ? Atrial fibrillation Brother   ? Heart disease Father   ? Stroke Father   ? Uterine cancer Mother   ? Arthritis/Rheumatoid Cousin   ? Diabetes Other   ? Colon cancer Neg Hx   ? Stomach cancer Neg Hx   ? Liver cancer Neg Hx   ? Rectal cancer Neg Hx   ? Esophageal cancer Neg Hx   ? ? ?SOCIAL HISTORY ?Social History  ? ?Tobacco Use  ?  Smoking status: Former  ?  Packs/day: 1.50  ?  Years: 25.00  ?  Pack years: 37.50  ?  Types: Cigarettes  ?  Quit date: 01/28/1986  ?  Years since quitting: 35.3  ? Smokeless tobacco: Never  ?Vaping Use  ? Vaping Use: Never used  ?Substance Use Topics  ? Alcohol use: Not Currently  ?  Comment: Very rare  ? Drug use: No  ? ?  ? ?  ? ?OPHTHALMIC EXAM: ? ?Base Eye Exam   ? ? Visual Acuity (ETDRS)   ? ?   Right Left  ? Dist cc HM 20/30  ? Dist ph cc  NI  ? ? Correction: Glasses  ? ?  ?  ? ? Tonometry (Tonopen, 1:05 PM)   ? ?  Right Left  ? Pressure 9 9  ? ?  ?  ? ? Pupils   ? ?   Pupils Dark Light APD  ? Right PERRL 4 3 None  ? Left PERRL 4 3 None  ? ?  ?  ? ? Extraocular Movement   ? ?   Right Left  ?  Full Full  ? ?  ?  ? ? Neuro/Psych   ? ? Oriented x3: Yes  ? Mood/Affect: Normal  ? ?  ?  ? ? Dilation   ? ? Left eye: 1.0% Mydriacyl, 2.5% Phenylephrine @ 1:05 PM  ? ?  ?  ? ?  ? ?Slit Lamp and Fundus Exam   ? ? External Exam   ? ?   Right Left  ? External Normal Normal  ? ?  ?  ? ? Slit Lamp Exam   ? ?   Right Left  ? Lids/Lashes Normal Normal  ? Conjunctiva/Sclera White and quiet White and quiet  ? Cornea Clear Clear  ? Anterior Chamber Deep and quiet Deep and quiet  ? Iris Round and reactive Round and reactive  ? Lens Posterior chamber intraocular lens Posterior chamber intraocular lens  ? Anterior Vitreous Normal Normal  ? ?  ?  ? ? Fundus Exam   ? ?   Right Left  ? Posterior Vitreous  Posterior vitreous detachment, Central vitreous floaters  ? Disc  Normal  ? C/D Ratio  0.4  ? Macula  Microaneurysms, , mild CME, Cystoid macular edema superior to the FAZ,, FROM MACULAR BRVO, no macular thickening  ? Vessels   BRVO,,, superior  ? Periphery  Normal  ? ?  ?  ? ?  ? ? ?IMAGING AND PROCEDURES  ?Imaging and Procedures for 06/13/21 ? ?Intravitreal Injection, Pharmacologic Agent - OS - Left Eye   ? ?   ?Time Out ?06/13/2021. 1:44 PM. Confirmed correct patient, procedure, site, and patient consented.  ? ?Anesthesia ?Topical  anesthesia was used. Anesthetic medications included Lidocaine 4%.  ? ?Procedure ?Preparation included Ofloxacin , Tobramycin 0.3%, 10% betadine to eyelids, 5% betadine to ocular surface. A 30 gauge needle was

## 2021-06-13 NOTE — Assessment & Plan Note (Signed)
Persistent optic pallor left eye.  Ongoing untreated sleep apnea, with restoration of therapy would likely preserve maximize optic nerve perfusion and minimize nightly hypoxic damage from untreated sleep apnea ?

## 2021-06-13 NOTE — Assessment & Plan Note (Signed)
Recent evaluation with Dr. Annamaria Boots, regarding CPAP usage.  Awaiting proper mask fitting and usage to begin. ?

## 2021-06-14 ENCOUNTER — Encounter (INDEPENDENT_AMBULATORY_CARE_PROVIDER_SITE_OTHER): Payer: Medicare HMO | Admitting: Ophthalmology

## 2021-07-24 ENCOUNTER — Ambulatory Visit (HOSPITAL_BASED_OUTPATIENT_CLINIC_OR_DEPARTMENT_OTHER): Payer: Medicare HMO | Attending: Internal Medicine | Admitting: Internal Medicine

## 2021-07-24 DIAGNOSIS — G4733 Obstructive sleep apnea (adult) (pediatric): Secondary | ICD-10-CM

## 2021-08-08 ENCOUNTER — Encounter (INDEPENDENT_AMBULATORY_CARE_PROVIDER_SITE_OTHER): Payer: Self-pay | Admitting: Ophthalmology

## 2021-08-08 ENCOUNTER — Ambulatory Visit (INDEPENDENT_AMBULATORY_CARE_PROVIDER_SITE_OTHER): Payer: Medicare HMO | Admitting: Ophthalmology

## 2021-08-08 DIAGNOSIS — G4733 Obstructive sleep apnea (adult) (pediatric): Secondary | ICD-10-CM | POA: Diagnosis not present

## 2021-08-08 DIAGNOSIS — H34832 Tributary (branch) retinal vein occlusion, left eye, with macular edema: Secondary | ICD-10-CM | POA: Diagnosis not present

## 2021-08-08 MED ORDER — BEVACIZUMAB 2.5 MG/0.1ML IZ SOSY
2.5000 mg | PREFILLED_SYRINGE | INTRAVITREAL | Status: AC | PRN
  Administered 2021-08-08: 2.5 mg via INTRAVITREAL

## 2021-08-08 NOTE — Assessment & Plan Note (Signed)
Patient has restarted CPAP with excellent compliance and reports improved sense of wellbeing, improved energy and most importantly his wife reports he is much more energetic and " nicer"

## 2021-08-08 NOTE — Progress Notes (Signed)
08/08/2021     CHIEF COMPLAINT Patient presents for No chief complaint on file.     HISTORY OF PRESENT ILLNESS: Jeffery Fernandez is a 76 y.o. male who presents to the clinic today for:   HPI   8 weeks for dilate OS avastin oct Pt states his vision has not been stable Pt admits to new floaters Pt states his vision been blurry  Pt states his vision up close has gotten worse. Last edited by Morene Rankins, CMA on 08/08/2021 10:23 AM.      Referring physician: Bernerd Limbo, MD [address] [place],  [place] [address]  HISTORICAL INFORMATION:   Selected notes from the MEDICAL RECORD NUMBER    Lab Results  Component Value Date   HGBA1C 9.3 (H) 05/11/2020     CURRENT MEDICATIONS: No current outpatient medications on file. (Ophthalmic Drugs)   No current facility-administered medications for this visit. (Ophthalmic Drugs)   Current Outpatient Medications (Other)  Medication Sig   acetaminophen (TYLENOL) 500 MG tablet Take 1,000 mg by mouth every 6 (six) hours as needed (for pain.).   amLODipine (NORVASC) 5 MG tablet Take 5 mg by mouth daily.   aspirin EC 81 MG tablet Take 325 mg by mouth daily. Swallow whole.   atorvastatin (LIPITOR) 40 MG tablet Take 20 mg by mouth at bedtime.    carvedilol (COREG) 25 MG tablet TAKE 1 TABLET BY MOUTH 2 TIMES DAILY WITH A MEAL. (Patient taking differently: Take 25 mg by mouth 2 (two) times daily with a meal.)   Cholecalciferol (D3-50 PO) Take by mouth.   glipiZIDE (GLUCOTROL XL) 10 MG 24 hr tablet Take 10 mg by mouth 2 (two) times daily.   HYDROcodone-acetaminophen (NORCO/VICODIN) 5-325 MG tablet Take 1 tablet by mouth every 4 (four) hours as needed for moderate pain.   insulin glargine (LANTUS) 100 UNIT/ML injection Inject 40 Units into the skin daily.   lisinopril (ZESTRIL) 40 MG tablet TAKE 1 TABLET BY MOUTH EVERY DAY (Patient taking differently: Take 40 mg by mouth daily.)   sertraline (ZOLOFT) 100 MG tablet Take 100  mg by mouth daily.   terazosin (HYTRIN) 5 MG capsule TAKE 1 CAPSULE BY MOUTH EVERYDAY AT BEDTIME (Patient taking differently: Take 5 mg by mouth at bedtime.)   Turmeric (QC TUMERIC COMPLEX) 500 MG CAPS Take by mouth.   No current facility-administered medications for this visit. (Other)      REVIEW OF SYSTEMS: ROS   Negative for: Constitutional, Gastrointestinal, Neurological, Skin, Genitourinary, Musculoskeletal, HENT, Endocrine, Cardiovascular, Eyes, Respiratory, Psychiatric, Allergic/Imm, Heme/Lymph Last edited by Morene Rankins, CMA on 08/08/2021 10:23 AM.       ALLERGIES Allergies  Allergen Reactions   Penicillins Other (See Comments)    Possibly rash - not sure Has patient had a PCN reaction causing immediate rash, facial/tongue/throat swelling, SOB or lightheadedness with hypotension:unknown Has patient had a PCN reaction causing severe rash involving mucus membranes or skin necrosis: Unknown Has patient had a PCN reaction that required hospitalization: Unknown Has patient had a PCN reaction occurring within the last 10 years: No Childhood reaction If all of the above answers are "NO", then may proceed with Cephalosporin use.     PAST MEDICAL HISTORY Past Medical History:  Diagnosis Date   Arrhythmia    Arthritis    Cataract    bilateral cataract extraction with intraoccular lens implants   Chest pain, unspecified    Chronic airway obstruction, not elsewhere classified  PT. DENIES HE HAS COPD   CKD (chronic kidney disease)    Colon polyps    Adenomatous Polyps 2007   Depression    Diabetes mellitus    Diverticulosis    Gout    Heart murmur    History of kidney stones    Hyperlipemia    Hypertension    Nephrolithiasis    Normocytic anemia 04/23/2017   Obstructive sleep apnea (adult) (pediatric)    Other and unspecified hyperlipidemia    Other left bundle branch block    Retinal vascular occlusion, unspecified    right   Shortness of breath    on  exertion   Unspecified sleep apnea    Vision loss of right eye    White coat syndrome with diagnosis of hypertension 09/19/2017   Past Surgical History:  Procedure Laterality Date   ANTERIOR CERVICAL DECOMP/DISCECTOMY FUSION N/A 06/26/2016   Procedure: Re-Exploration of Cervical Wound;  Surgeon: Kary Kos, MD;  Location: [facility];  Service: Neurosurgery;  Laterality: N/A;   ANTERIOR CERVICAL DECOMP/DISCECTOMY FUSION N/A 06/26/2016   Procedure: ANTERIOR CERVICAL DECOMPRESSION/DISCECTOMY FUSION, INTERBODY PROSTESIS, PLATE, CERVICAL THREE CERVICAL FOUR, CERVICAL FOUR CERVICAL FIVE CERVICAL SIX;  Surgeon: Newman Pies, MD;  Location: [facility];  Service: Neurosurgery;  Laterality: N/A;   APPLICATION OF WOUND VAC  06/05/2020   Procedure: APPLICATION OF WOUND VAC;  Surgeon: Newman Pies, MD;  Location: [facility];  Service: Neurosurgery;;   BACK SURGERY     CATARACT EXTRACTION Bilateral    KIDNEY STONE SURGERY     KYPHOPLASTY N/A 01/19/2018   Procedure: KYPHOPLASTY LUMBAR THREE;  Surgeon: Newman Pies, MD;  Location: [facility];  Service: Neurosurgery;  Laterality: N/A;  KYPHOPLASTY LUMBAR THREE   LUMBAR WOUND DEBRIDEMENT N/A 06/05/2020   Procedure: LUMBAR WOUND DEBRIDEMENT;  Surgeon: Newman Pies, MD;  Location: [facility];  Service: Neurosurgery;  Laterality: N/A;    FAMILY HISTORY Family History  Problem Relation Age of Onset   Heart attack Brother 51       heart attack and CHF/ also some form of EP ablation   Parkinson's disease Brother    Atrial fibrillation Brother    Heart disease Father    Stroke Father    Uterine cancer Mother    Arthritis/Rheumatoid Cousin    Diabetes Other    Colon cancer Neg Hx    Stomach cancer Neg Hx    Liver cancer Neg Hx    Rectal cancer Neg Hx    Esophageal cancer Neg Hx     SOCIAL HISTORY Social History   Tobacco Use   Smoking status: Former    Packs/day: 1.50    Years: 25.00    Total pack years: 37.50    Types: Cigarettes    Quit date: 01/28/1986     Years since quitting: 35.5   Smokeless tobacco: Never  Vaping Use   Vaping Use: Never used  Substance Use Topics   Alcohol use: Not Currently    Comment: Very rare   Drug use: No         OPHTHALMIC EXAM:  Base Eye Exam     Visual Acuity (ETDRS)       Right Left   Dist cc HM 20/30 +2         Tonometry (Tonopen, 10:28 AM)       Right Left   Pressure 8 8         Pupils       Pupils APD   Right PERRL  None   Left PERRL None         Neuro/Psych     Oriented x3: Yes   Mood/Affect: Normal         Dilation     Left eye: 2.5% Phenylephrine, 1.0% Mydriacyl @ 10:24 AM           Slit Lamp and Fundus Exam     External Exam       Right Left   External Normal Normal         Slit Lamp Exam       Right Left   Lids/Lashes Normal Normal   Conjunctiva/Sclera White and quiet White and quiet   Cornea Clear Clear   Anterior Chamber Deep and quiet Deep and quiet   Iris Round and reactive Round and reactive   Lens Posterior chamber intraocular lens Posterior chamber intraocular lens   Anterior Vitreous Normal Normal         Fundus Exam       Right Left   Posterior Vitreous  Posterior vitreous detachment, Central vitreous floaters   Disc  Normal   C/D Ratio  0.4   Macula  Microaneurysms, , mild CME, Cystoid macular edema superior to the FAZ,, FROM MACULAR BRVO, no macular thickening   Vessels   BRVO,,, superior   Periphery  Normal            IMAGING AND PROCEDURES  Imaging and Procedures for 08/08/21  OCT, Retina - OU - Both Eyes       Left Eye Central Foveal Thickness: 300. Progression has worsened. Findings include cystoid macular edema.   Notes OD no view  OS watch region superior to FAZ from macular BRVO     Intravitreal Injection, Pharmacologic Agent - OS - Left Eye       Time Out 08/08/2021. 11:09 AM. Confirmed correct patient, procedure, site, and patient consented.   Anesthesia Topical anesthesia was used.  Anesthetic medications included Lidocaine 4%.   Procedure Preparation included 5% betadine to ocular surface, 10% betadine to eyelids, Tobramycin 0.3%, Ofloxacin . A 30 gauge needle was used.   Injection: 2.5 mg bevacizumab 2.5 MG/0.1ML   Route: Intravitreal, Site: Left Eye   NDC: [phone], Lot: [id number], Expiration date: 09/28/2021   Post-op Post injection exam found visual acuity of at least counting fingers. The patient tolerated the procedure well. There were no complications. The patient received written and verbal post procedure care education. Post injection medications included ocuflox.              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  The patient was given access to printed information.  The treatment options including continued observation looking for spontaneous resolution versus grid laser versus intravitreal Kenalog injection were discussed.  PRIMARY THERAPY CONSISTS of Anti-VEGF Therapies, AVASTIN, LUCENTIS AND EYLEA.  Their usage was discussed to assist in halting the progression of Macular Edema, in order to preserve, protect or improve acuity.  Additionally, at times, limited focal laser therapy is used in the management.  The risks and benefits of all these options were discussed with the patient.  The patient's questions were answered. Stable OS overall post Avastin.  Preserved acuity currently at 8-week follow-up interval.  Repeat Avastin today and reevaluate again in 8 weeks  OSA (obstructive sleep apnea) Patient has restarted CPAP with excellent compliance and reports improved sense of wellbeing, improved energy  and most importantly his wife reports he is much more energetic and " nicer"     ICD-10-CM   1. 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 OSA (obstructive sleep apnea)  G47.33       1.  OS with macular branch retinal vein occlusion and CME stable overall.  Repeat injection today at 8-week interval.  We will maintain  2.  Patient continues on CPAP use now resumes it at my suggestion so as to maximize nightly oxygenation CNS and eyes  3.  Ophthalmic Meds Ordered this visit:  Meds ordered this encounter  Medications   bevacizumab (AVASTIN) SOSY 2.5 mg       Return in about 8 weeks (around 10/03/2021) for DILATE OU, AVASTIN OCT, OS.  There are no Patient Instructions on file for this visit.   Explained the diagnoses, plan, and follow up with the patient and they expressed understanding.  Patient expressed understanding of the importance of proper follow up care.   Clent Demark Shunda Rabadi M.D. Diseases & Surgery of the Retina and Vitreous [facility] [place] 08/08/21     Abbreviations: M myopia (nearsighted); A astigmatism; H hyperopia (farsighted); P presbyopia; Mrx spectacle prescription;  CTL contact lenses; OD right eye; OS left eye; OU both eyes  XT exotropia; ET esotropia; PEK punctate epithelial keratitis; PEE punctate epithelial erosions; DES dry eye syndrome; MGD meibomian gland dysfunction; ATs artificial tears; PFAT's preservative free artificial tears; [place] nuclear sclerotic cataract; PSC posterior subcapsular cataract; ERM epi-retinal membrane; PVD posterior vitreous detachment; RD retinal detachment; DM diabetes mellitus; DR diabetic retinopathy; NPDR non-proliferative diabetic retinopathy; PDR proliferative diabetic retinopathy; CSME clinically significant macular edema; DME diabetic macular edema; dbh dot blot hemorrhages; CWS cotton wool spot; POAG primary open angle glaucoma; C/D cup-to-disc ratio; HVF humphrey visual field; GVF goldmann visual field; OCT optical coherence tomography; IOP intraocular pressure; BRVO Branch retinal vein  occlusion; CRVO central retinal vein occlusion; CRAO central retinal artery occlusion; BRAO branch retinal artery occlusion; RT retinal tear; SB scleral buckle; PPV pars plana vitrectomy; VH Vitreous hemorrhage; PRP panretinal laser photocoagulation; IVK intravitreal kenalog; VMT vitreomacular traction; MH Macular hole;  NVD neovascularization of the disc; NVE neovascularization elsewhere; AREDS age related eye disease study; ARMD age related macular degeneration; POAG primary open angle glaucoma; EBMD epithelial/anterior basement membrane dystrophy; ACIOL anterior chamber intraocular lens; IOL intraocular lens; PCIOL posterior chamber intraocular lens; Phaco/IOL phacoemulsification with intraocular lens placement; [place] photorefractive keratectomy; LASIK laser assisted in situ keratomileusis; HTN hypertension; DM diabetes mellitus; COPD chronic obstructive pulmonary disease

## 2021-08-08 NOTE — Assessment & Plan Note (Signed)
The nature of branch retinal vein occlusion with macular edema was discussed.  The patient was given access to printed information.  The treatment options including continued observation looking for spontaneous resolution versus grid laser versus intravitreal Kenalog injection were discussed.  PRIMARY THERAPY CONSISTS of Anti-VEGF Therapies, AVASTIN, LUCENTIS AND EYLEA.  Their usage was discussed to assist in halting the progression of Macular Edema, in order to preserve, protect or improve acuity.  Additionally, at times, limited focal laser therapy is used in the management.  The risks and benefits of all these options were discussed with the patient.  The patient's questions were answered. Stable OS overall post Avastin.  Preserved acuity currently at 8-week follow-up interval.  Repeat Avastin today and reevaluate again in 8 weeks

## 2021-09-12 NOTE — Progress Notes (Deleted)
   Subjective:    Patient ID: Jeffery Fernandez, male    DOB: 19-Jun-1945, 76 y.o.   MRN: [id number]  HPI . male followed for OSA, complicated by COPD, CAD, HBP, DM NPSG 06/14/10   AHI 57/ hr, desaturation to 83%  =============================================================================  05/15/21- 52YO M  followed for OSA, complicated by COPD, CAD, HBP, DM, Branch Retinal Vein Occlusion, Spinal Stenosis,  CPAP 10/ [employer] Download compliance Epworth score-9 Body weight today-201 lbs Covid vax-4 Phizer Flu vax-had -----Patient is currently using CPAP machine that he has had for several years. Would like a new mask does not like the one he currently has.  Machine is old and due for replacement.  He is uncomfortable with mask that leaks considerably.  We discussed replacement of both. Daughter is here and they deny other issues.  He has considerable low back pain after complicated surgery.  09/13/21- 34YO M  followed for OSA, complicated by COPD, CAD, HBP, DM, Branch Retinal Vein Occlusion, Spinal Stenosis,  CPAP 5-15/ [employer] replacement ordered 06/04/21 Download compliance Body weight today Covid vax-4 Phizer  Review of Systems  Constitutional: Negative for fever and unexpected weight change.  HENT: Negative for congestion, dental problem, ear pain, nosebleeds, postnasal drip, rhinorrhea, sinus pressure, sneezing, sore throat and trouble swallowing.   Eyes: Negative for redness and itching.  Respiratory: Negative for cough, chest tightness, shortness of breath and wheezing.   Cardiovascular: Negative for palpitations and leg swelling.  Gastrointestinal: Negative for nausea and vomiting.  Genitourinary: Negative for dysuria.  Musculoskeletal: Negative for joint swelling.  Skin: Negative for rash.  Neurological: Negative for headaches.  Hematological: Does not bruise/bleed easily.  Psychiatric/Behavioral: Negative for dysphoric mood. The patient is not nervous/anxious.      Objective:    OBJ- Physical Exam General- Alert, Oriented, Affect-appropriate, Distress- none acute,  Skin- rash-none, lesions- none, excoriation- none Lymphadenopathy- none Head- atraumatic            Eyes- Gross vision intact, PERRLA, conjunctivae and secretions clear            Ears- Hearing, canals-normal            Nose- Clear, no-Septal dev, mucus, polyps, erosion, perforation             Throat- Mallampati III-IV , mucosa clear , drainage- none, tonsils- atrophic, + dental repair/many missing teeth/upper plate Neck- flexible , trachea midline, no stridor , thyroid nl, carotid no bruit Chest - symmetrical excursion , unlabored           Heart/CV- RRR , no murmur , no gallop  , no rub, nl s1 s2                           - JVD- none , edema- none, stasis changes- none, varices- none           Lung- clear to P&A, wheeze- none, cough- none , dullness-none, rub- none           Chest wall-  Abd-  Br/ Gen/ Rectal- Not done, not indicated Extrem- cyanosis- none, clubbing, none, atrophy- none, strength- nl Neuro- grossly intact to observation         Assessment & Plan:

## 2021-09-13 ENCOUNTER — Ambulatory Visit: Payer: Medicare HMO | Admitting: Internal Medicine

## 2021-10-03 ENCOUNTER — Ambulatory Visit (INDEPENDENT_AMBULATORY_CARE_PROVIDER_SITE_OTHER): Payer: Medicare HMO | Admitting: Ophthalmology

## 2021-10-03 ENCOUNTER — Encounter (INDEPENDENT_AMBULATORY_CARE_PROVIDER_SITE_OTHER): Payer: Self-pay | Admitting: Ophthalmology

## 2021-10-03 DIAGNOSIS — H34832 Tributary (branch) retinal vein occlusion, left eye, with macular edema: Secondary | ICD-10-CM

## 2021-10-03 DIAGNOSIS — H47012 Ischemic optic neuropathy, left eye: Secondary | ICD-10-CM | POA: Diagnosis not present

## 2021-10-03 DIAGNOSIS — H4720 Unspecified optic atrophy: Secondary | ICD-10-CM

## 2021-10-03 DIAGNOSIS — G4733 Obstructive sleep apnea (adult) (pediatric): Secondary | ICD-10-CM | POA: Diagnosis not present

## 2021-10-03 MED ORDER — BEVACIZUMAB CHEMO INJECTION 1.25MG/0.05ML SYRINGE FOR KALEIDOSCOPE
1.2500 mg | INTRAVITREAL | Status: AC | PRN
  Administered 2021-10-03: 1.25 mg via INTRAVITREAL

## 2021-10-03 NOTE — Progress Notes (Signed)
10/03/2021     CHIEF COMPLAINT Patient presents for  Chief Complaint  Patient presents with   Branch Retinal Vein Occlusion      HISTORY OF PRESENT ILLNESS: Jeffery Fernandez is a 76 y.o. male who presents to the clinic today for:   HPI   8 weeks for DILATE OU, AVASTIN OCT, OS. Pt stated no changes in the left eye however pt noticed a "slight overcast."  Last edited by Silvestre Moment on 10/03/2021 10:54 AM.      Referring physician: Bernerd Limbo, MD [place],  [place] [id number]  HISTORICAL INFORMATION:   Selected notes from the MEDICAL RECORD NUMBER    Lab Results  Component Value Date   HGBA1C 9.3 (H) 05/11/2020     CURRENT MEDICATIONS: No current outpatient medications on file. (Ophthalmic Drugs)   No current facility-administered medications for this visit. (Ophthalmic Drugs)   Current Outpatient Medications (Other)  Medication Sig   acetaminophen (TYLENOL) 500 MG tablet Take 1,000 mg by mouth every 6 (six) hours as needed (for pain.).   amLODipine (NORVASC) 5 MG tablet Take 5 mg by mouth daily.   aspirin EC 81 MG tablet Take 325 mg by mouth daily. Swallow whole.   atorvastatin (LIPITOR) 40 MG tablet Take 20 mg by mouth at bedtime.    carvedilol (COREG) 25 MG tablet TAKE 1 TABLET BY MOUTH 2 TIMES DAILY WITH A MEAL. (Patient taking differently: Take 25 mg by mouth 2 (two) times daily with a meal.)   Cholecalciferol (D3-50 PO) Take by mouth.   glipiZIDE (GLUCOTROL XL) 10 MG 24 hr tablet Take 10 mg by mouth 2 (two) times daily.   HYDROcodone-acetaminophen (NORCO/VICODIN) 5-325 MG tablet Take 1 tablet by mouth every 4 (four) hours as needed for moderate pain.   insulin glargine (LANTUS) 100 UNIT/ML injection Inject 40 Units into the skin daily.   lisinopril (ZESTRIL) 40 MG tablet TAKE 1 TABLET BY MOUTH EVERY DAY (Patient taking differently: Take 40 mg by mouth daily.)   sertraline (ZOLOFT) 100 MG tablet Take 100 mg by mouth daily.   terazosin  (HYTRIN) 5 MG capsule TAKE 1 CAPSULE BY MOUTH EVERYDAY AT BEDTIME (Patient taking differently: Take 5 mg by mouth at bedtime.)   Turmeric (QC TUMERIC COMPLEX) 500 MG CAPS Take by mouth.   No current facility-administered medications for this visit. (Other)      REVIEW OF SYSTEMS: ROS   Negative for: Constitutional, Gastrointestinal, Neurological, Skin, Genitourinary, Musculoskeletal, HENT, Endocrine, Cardiovascular, Eyes, Respiratory, Psychiatric, Allergic/Imm, Heme/Lymph Last edited by Silvestre Moment on 10/03/2021 10:54 AM.       ALLERGIES Allergies  Allergen Reactions   Penicillins Other (See Comments)    Possibly rash - not sure Has patient had a PCN reaction causing immediate rash, facial/tongue/throat swelling, SOB or lightheadedness with hypotension:unknown Has patient had a PCN reaction causing severe rash involving mucus membranes or skin necrosis: Unknown Has patient had a PCN reaction that required hospitalization: Unknown Has patient had a PCN reaction occurring within the last 10 years: No Childhood reaction If all of the above answers are "NO", then may proceed with Cephalosporin use.     PAST MEDICAL HISTORY Past Medical History:  Diagnosis Date   Arrhythmia    Arthritis    Cataract    bilateral cataract extraction with intraoccular lens implants   Chest pain, unspecified    Chronic airway obstruction, not elsewhere classified    PT. DENIES HE HAS COPD  CKD (chronic kidney disease)    Colon polyps    Adenomatous Polyps 2007   Depression    Diabetes mellitus    Diverticulosis    Gout    Heart murmur    History of kidney stones    Hyperlipemia    Hypertension    Nephrolithiasis    Normocytic anemia 04/23/2017   Obstructive sleep apnea (adult) (pediatric)    Other and unspecified hyperlipidemia    Other left bundle branch block    Retinal vascular occlusion, unspecified    right   Shortness of breath    on exertion   Unspecified sleep apnea    Vision  loss of right eye    White coat syndrome with diagnosis of hypertension 09/19/2017   Past Surgical History:  Procedure Laterality Date   ANTERIOR CERVICAL DECOMP/DISCECTOMY FUSION N/A 06/26/2016   Procedure: Re-Exploration of Cervical Wound;  Surgeon: Kary Kos, MD;  Location: [facility];  Service: Neurosurgery;  Laterality: N/A;   ANTERIOR CERVICAL DECOMP/DISCECTOMY FUSION N/A 06/26/2016   Procedure: ANTERIOR CERVICAL DECOMPRESSION/DISCECTOMY FUSION, INTERBODY PROSTESIS, PLATE, CERVICAL THREE CERVICAL FOUR, CERVICAL FOUR CERVICAL FIVE CERVICAL SIX;  Surgeon: Newman Pies, MD;  Location: [facility];  Service: Neurosurgery;  Laterality: N/A;   APPLICATION OF WOUND VAC  06/05/2020   Procedure: APPLICATION OF WOUND VAC;  Surgeon: Newman Pies, MD;  Location: [facility];  Service: Neurosurgery;;   BACK SURGERY     CATARACT EXTRACTION Bilateral    KIDNEY STONE SURGERY     KYPHOPLASTY N/A 01/19/2018   Procedure: KYPHOPLASTY LUMBAR THREE;  Surgeon: Newman Pies, MD;  Location: [facility];  Service: Neurosurgery;  Laterality: N/A;  KYPHOPLASTY LUMBAR THREE   LUMBAR WOUND DEBRIDEMENT N/A 06/05/2020   Procedure: LUMBAR WOUND DEBRIDEMENT;  Surgeon: Newman Pies, MD;  Location: [facility];  Service: Neurosurgery;  Laterality: N/A;    FAMILY HISTORY Family History  Problem Relation Age of Onset   Heart attack Brother 32       heart attack and CHF/ also some form of EP ablation   Parkinson's disease Brother    Atrial fibrillation Brother    Heart disease Father    Stroke Father    Uterine cancer Mother    Arthritis/Rheumatoid Cousin    Diabetes Other    Colon cancer Neg Hx    Stomach cancer Neg Hx    Liver cancer Neg Hx    Rectal cancer Neg Hx    Esophageal cancer Neg Hx     SOCIAL HISTORY Social History   Tobacco Use   Smoking status: Former    Packs/day: 1.50    Years: 25.00    Total pack years: 37.50    Types: Cigarettes    Quit date: 01/28/1986    Years since quitting: 35.7   Smokeless  tobacco: Never  Vaping Use   Vaping Use: Never used  Substance Use Topics   Alcohol use: Not Currently    Comment: Very rare   Drug use: No         OPHTHALMIC EXAM:  Base Eye Exam     Visual Acuity (ETDRS)       Right Left   Dist cc HM 20/30 -1   Dist ph cc  NI    Correction: Glasses         Tonometry (Tonopen, 10:59 AM)       Right Left   Pressure 13 14         Pupils       Pupils  APD   Right PERRL None   Left PERRL None         Visual Fields       Left Right   Restrictions  Partial inner superior temporal, inferior temporal, superior nasal, inferior nasal deficiencies         Extraocular Movement       Right Left    Full, Ortho Full, Ortho         Neuro/Psych     Oriented x3: Yes   Mood/Affect: Normal         Dilation     Both eyes: 1.0% Mydriacyl, 2.5% Phenylephrine @ 10:59 AM           Slit Lamp and Fundus Exam     External Exam       Right Left   External Normal Normal         Slit Lamp Exam       Right Left   Lids/Lashes Normal Normal   Conjunctiva/Sclera White and quiet White and quiet   Cornea Clear Clear   Anterior Chamber Deep and quiet Deep and quiet   Iris Round and reactive Round and reactive   Lens Posterior chamber intraocular lens Posterior chamber intraocular lens   Anterior Vitreous Normal Normal         Fundus Exam       Right Left   Posterior Vitreous  Posterior vitreous detachment, Central vitreous floaters   Disc  Normal   C/D Ratio  0.4   Macula  Microaneurysms, , mild CME, Cystoid macular edema superior to the FAZ,, FROM MACULAR BRVO, no macular thickening   Vessels   BRVO,,, superior   Periphery  Normal            IMAGING AND PROCEDURES  Imaging and Procedures for 10/03/21  OCT, Retina - OU - Both Eyes       Right Eye Quality was good. Scan locations included subfoveal. Central Foveal Thickness: 240. Findings include normal foveal contour.   Left Eye Quality was  good. Scan locations included subfoveal. Central Foveal Thickness: 285. Progression has worsened. Findings include cystoid macular edema.   Notes OD no view  OS watch region superior to FAZ from macular BRVO     Intravitreal Injection, Pharmacologic Agent - OS - Left Eye       Time Out 10/03/2021. 11:54 AM. Confirmed correct patient, procedure, site, and patient consented.   Anesthesia Topical anesthesia was used. Anesthetic medications included Lidocaine 4%.   Procedure Preparation included 5% betadine to ocular surface, 10% betadine to eyelids, Tobramycin 0.3%, Ofloxacin . A 30 gauge needle was used.   Injection: 1.25 mg Bevacizumab 1.'25mg'$ /0.67m   Route: Intravitreal, Site: Left Eye   NDC: [id number] Lot: [id number] Expiration date: 12/12/2021   Post-op Post injection exam found visual acuity of at least counting fingers. The patient tolerated the procedure well. There were no complications. The patient received written and verbal post procedure care education. Post injection medications included ocuflox.              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 OSA (obstructive sleep apnea)  G47.33     3. Optic disc atrophy, right  H47.20     4. 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  Repeat injection intravitreal Avastin OS at 8-week interval and extend interval examination to 10 weeks 2.  3.  Ophthalmic Meds Ordered this visit:   Meds ordered this encounter  Medications   Bevacizumab (AVASTIN) SOLN 1.25 mg       Return in about 10 weeks (around 12/12/2021) for dilate, OS, AVASTIN OCT.  There are no Patient Instructions on file for this visit.   Explained the diagnoses, plan, and follow up with the patient and they expressed understanding.  Patient expressed understanding of the importance of proper follow up care.   Clent Demark Cruise Baumgardner M.D. Diseases & Surgery of the Retina and Vitreous [facility] 10/03/21     Abbreviations: M myopia (nearsighted); A astigmatism; H hyperopia (farsighted); P presbyopia; Mrx spectacle prescription;  CTL contact lenses; OD right eye; OS left eye; OU both eyes  XT exotropia; ET esotropia; PEK punctate epithelial keratitis; PEE punctate epithelial erosions; DES dry eye syndrome; MGD meibomian gland dysfunction; ATs artificial tears; PFAT's preservative free artificial tears; [place] nuclear sclerotic cataract; PSC posterior subcapsular cataract; ERM epi-retinal membrane; PVD posterior vitreous detachment; RD retinal detachment; DM diabetes mellitus; DR diabetic retinopathy; NPDR non-proliferative diabetic retinopathy; PDR proliferative diabetic retinopathy; CSME clinically significant macular edema; DME diabetic macular edema; dbh dot blot hemorrhages; CWS cotton wool spot; POAG primary open angle glaucoma; C/D cup-to-disc ratio; HVF humphrey visual field; GVF goldmann visual field; OCT optical coherence tomography; IOP intraocular pressure; BRVO Branch retinal vein occlusion; CRVO central retinal vein occlusion; CRAO central retinal artery occlusion; BRAO branch retinal artery occlusion; RT retinal tear; SB scleral buckle; PPV pars plana vitrectomy; VH Vitreous hemorrhage; PRP panretinal laser photocoagulation; IVK intravitreal kenalog; VMT vitreomacular traction; MH Macular hole;  NVD neovascularization of the disc; NVE neovascularization elsewhere; AREDS age related eye disease  study; ARMD age related macular degeneration; POAG primary open angle glaucoma; EBMD epithelial/anterior basement membrane dystrophy; ACIOL anterior chamber intraocular lens; IOL intraocular lens; PCIOL posterior chamber intraocular lens; Phaco/IOL phacoemulsification with intraocular lens placement; [place] photorefractive keratectomy; LASIK laser assisted in situ keratomileusis; HTN hypertension; DM diabetes mellitus; COPD chronic obstructive pulmonary disease

## 2021-10-03 NOTE — Assessment & Plan Note (Signed)
Much improved now coincident with an eye vegF at 8-week interval and ongoing use of CPAP

## 2021-10-03 NOTE — Assessment & Plan Note (Signed)
Compliant 

## 2021-10-03 NOTE — Assessment & Plan Note (Signed)
Minor in the past

## 2021-10-03 NOTE — Assessment & Plan Note (Signed)
Stable over time. 

## 2021-12-12 ENCOUNTER — Encounter (INDEPENDENT_AMBULATORY_CARE_PROVIDER_SITE_OTHER): Payer: Medicare HMO | Admitting: Ophthalmology

## 2021-12-18 IMAGING — CT CT HEAD W/O CM
4 series · 16 of 47 positions shown, 18 images · non-contrast
Comparison: Brain MRI 04/10/2016. head CT 03/29/2016.

CLINICAL DATA: Mental status change, unknown cause; decreased
mental status.

EXAM:
CT HEAD WITHOUT CONTRAST
TECHNIQUE: Contiguous axial images were obtained from the base of the skull
through the vertex without intravenous contrast.

[Series 3: head bone · axial · 0.48mm/px · z∈[-80,-48]mm · 3 of 82 slices shown]
[im 9/82  bone]
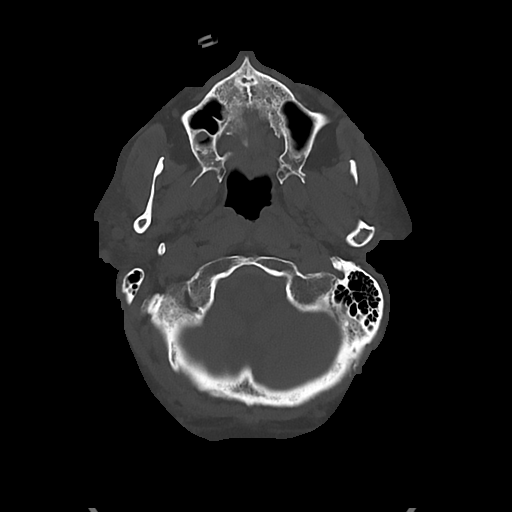
[im 17/82  bone]
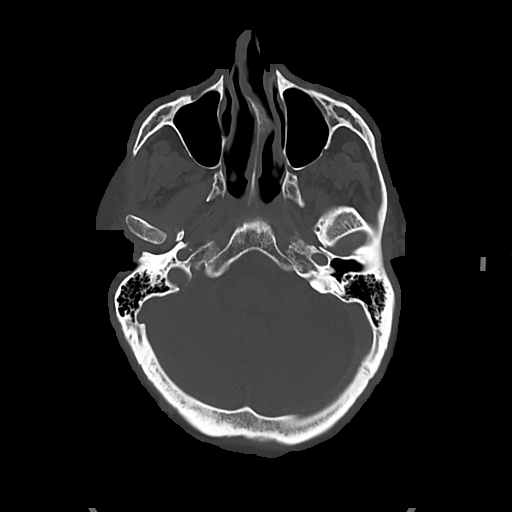
[im 25/82  bone]
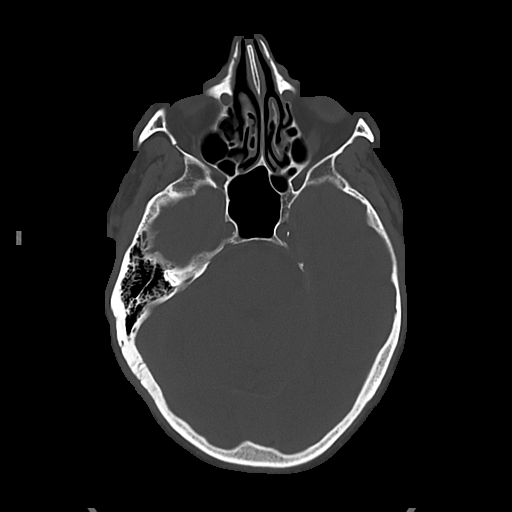

[Series 4: head wo · axial · 0.48mm/px · z∈[-76,+44]mm · 7 of 33 slices shown, 9 images]
[im 5/33  brain]
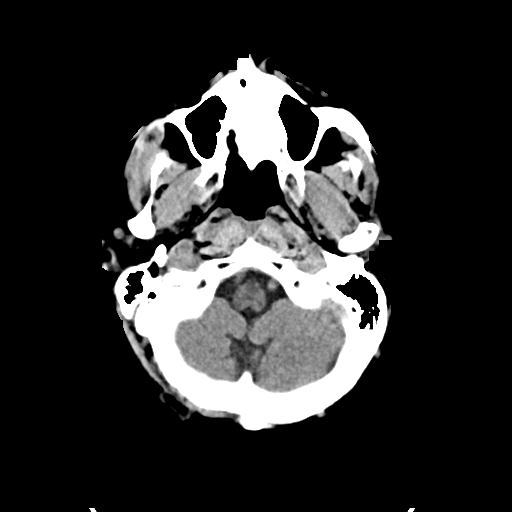
[im 5/33  bone]
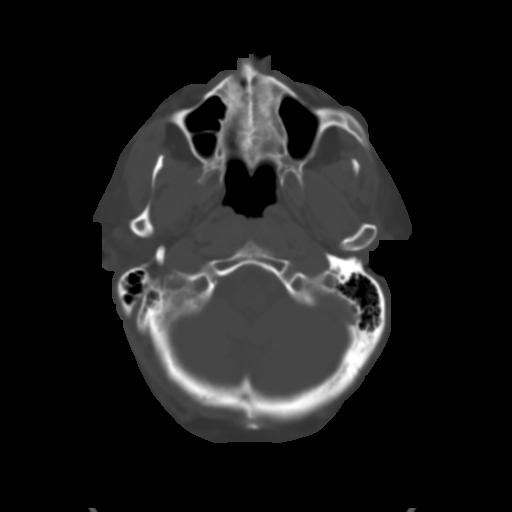
[im 9/33  brain]
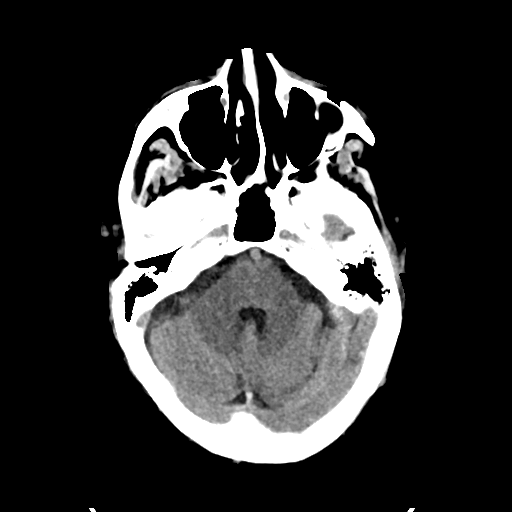
[im 13/33  brain]
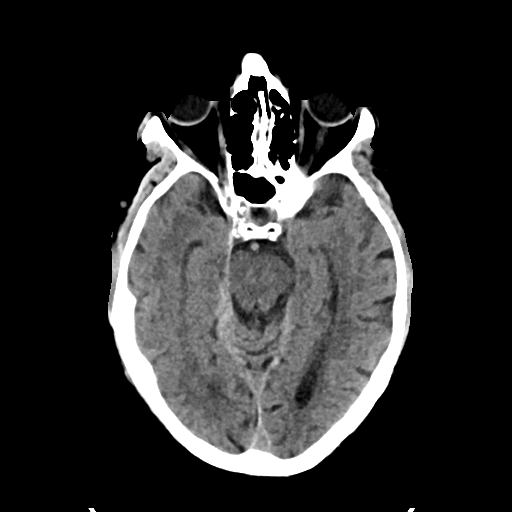
[im 17/33  brain]
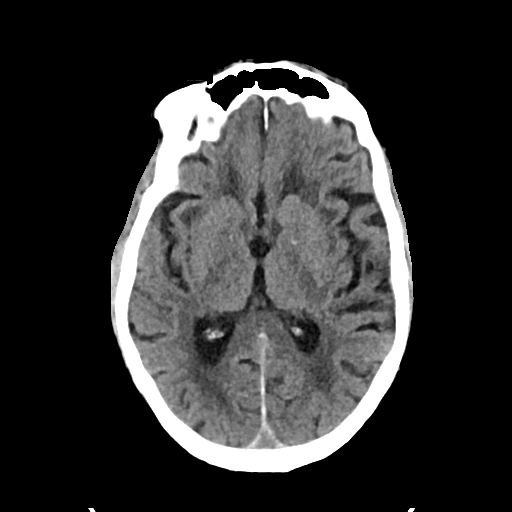
[im 21/33  brain]
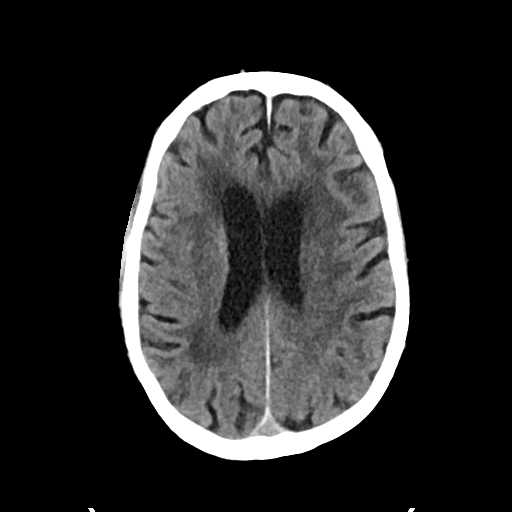
[im 21/33  bone]
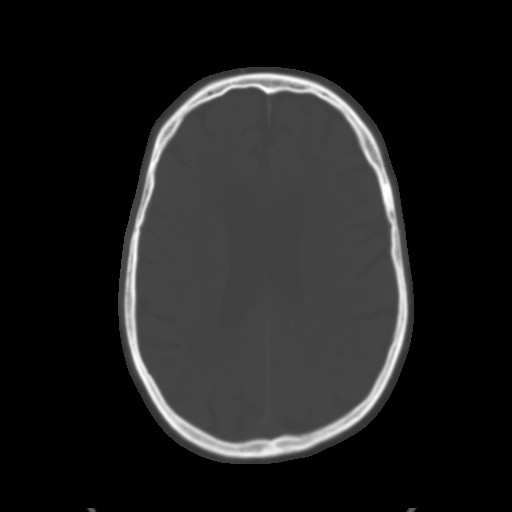
[im 25/33  brain]
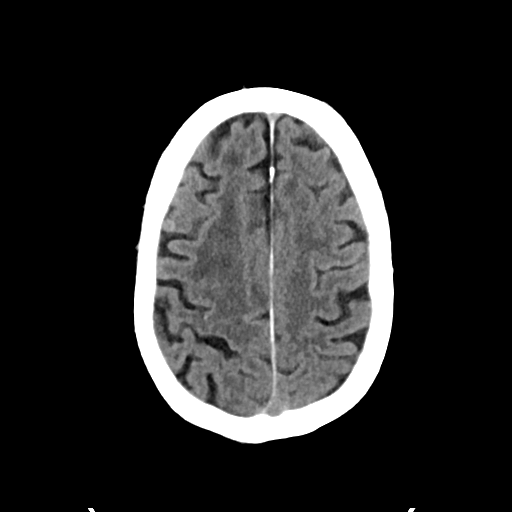
[im 29/33  brain]
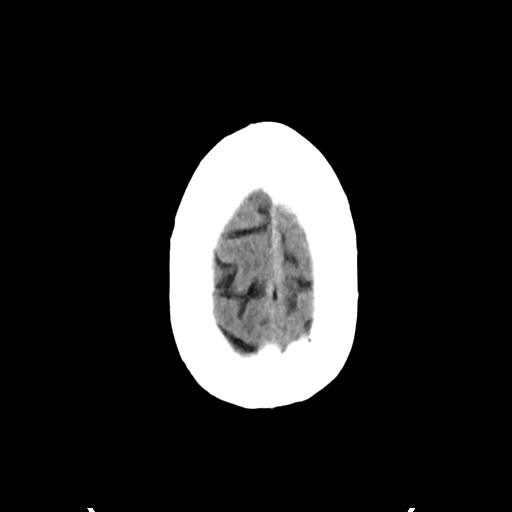

[Series 5: cor soft · coronal · 0.33mm/px · 3 of 73 slices shown]
[im 25/73  brain]
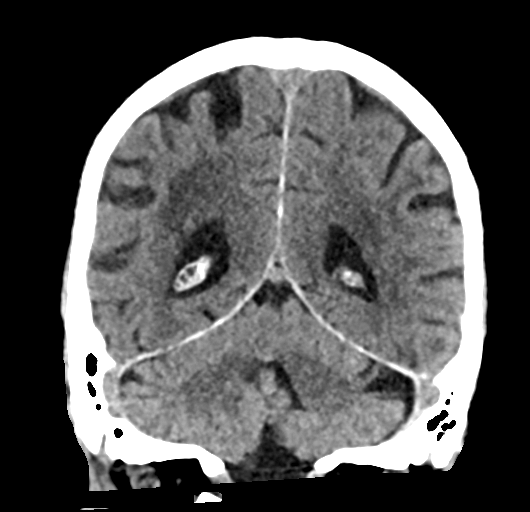
[im 33/73  brain]
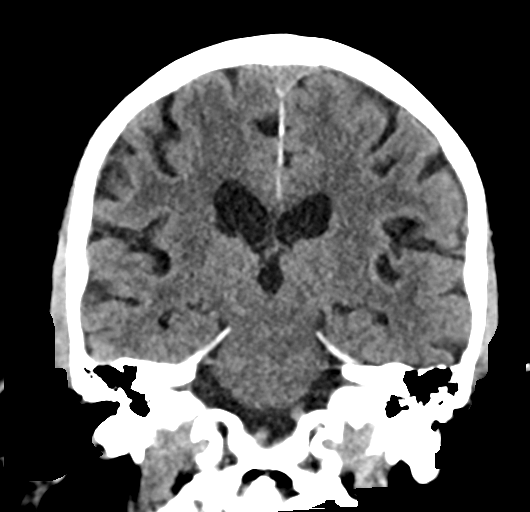
[im 41/73  brain]
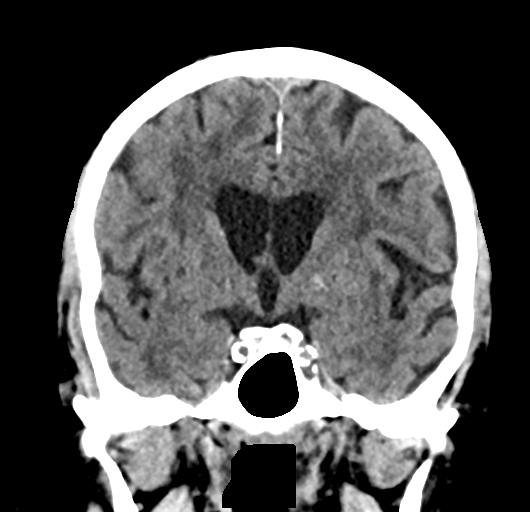

[Series 6: sag soft · sagittal · 0.34mm/px · 3 of 56 slices shown]
[im 19/56  brain]
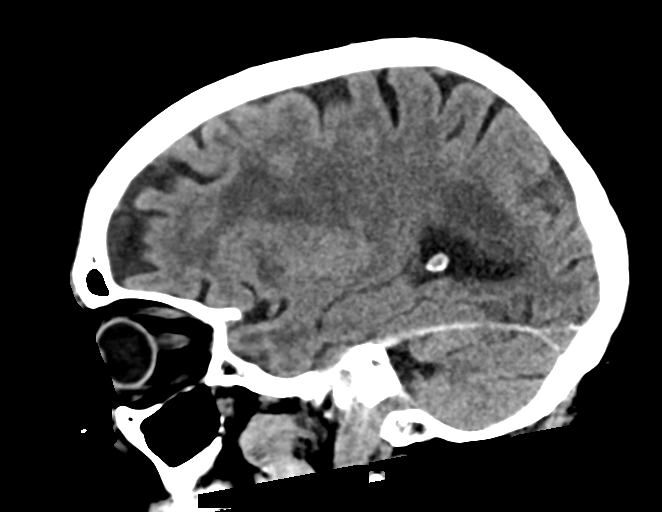
[im 28/56  brain]
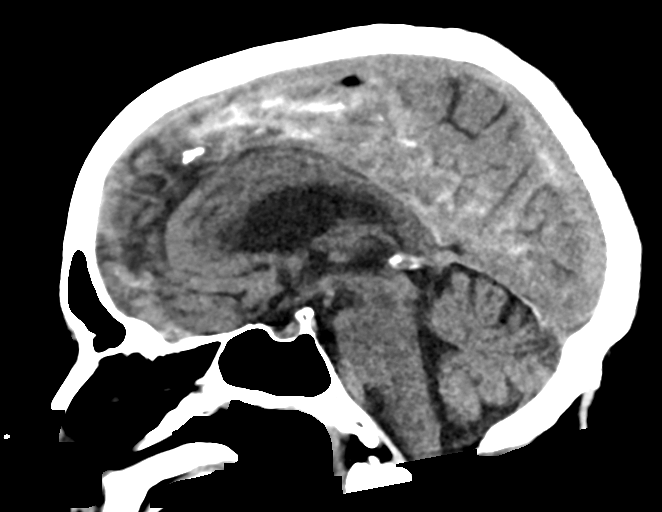
[im 37/56  brain]
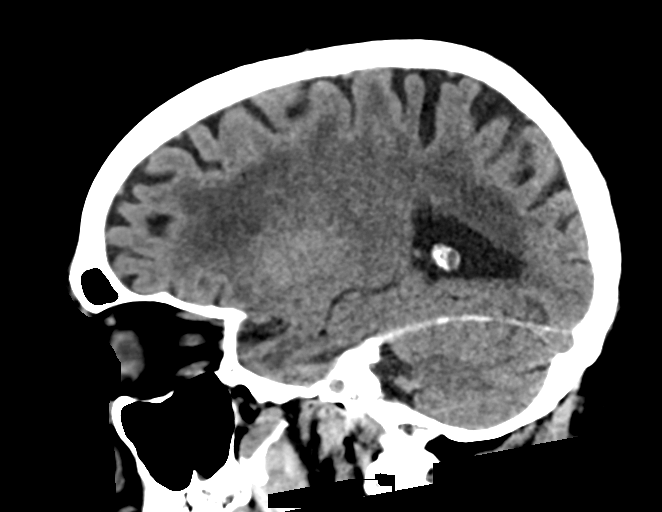

[16 of 47 positions shown; findings below may reference images not displayed]

FINDINGS: Brain:

Mild cerebral and cerebellar atrophy.

Advanced patchy and ill-defined hypoattenuation within the cerebral
white matter is nonspecific, but compatible with chronic small
vessel ischemic disease.

There is no acute intracranial hemorrhage.

No demarcated cortical infarct.

No extra-axial fluid collection.

No evidence of intracranial mass.

No midline shift.

Vascular: No hyperdense vessel.  Atherosclerotic calcifications

Skull: Normal. Negative for fracture or focal lesion.

Sinuses/Orbits: Visualized orbits show no acute finding. Mild
bilateral ethmoid sinus mucosal thickening.
IMPRESSION: No evidence of acute intracranial abnormality.

Mild generalized parenchymal atrophy with advanced cerebral white
matter chronic small vessel ischemic disease.

Mild bilateral ethmoid sinus mucosal thickening.

## 2022-07-02 DIAGNOSIS — T148XXD Other injury of unspecified body region, subsequent encounter: Secondary | ICD-10-CM | POA: Insufficient documentation

## 2022-07-02 DIAGNOSIS — M461 Sacroiliitis, not elsewhere classified: Secondary | ICD-10-CM | POA: Insufficient documentation

## 2022-07-02 DIAGNOSIS — G8929 Other chronic pain: Secondary | ICD-10-CM | POA: Insufficient documentation

## 2022-07-02 DIAGNOSIS — L820 Inflamed seborrheic keratosis: Secondary | ICD-10-CM | POA: Insufficient documentation

## 2022-07-02 DIAGNOSIS — M545 Low back pain, unspecified: Secondary | ICD-10-CM | POA: Insufficient documentation

## 2022-07-02 DIAGNOSIS — M4690 Unspecified inflammatory spondylopathy, site unspecified: Secondary | ICD-10-CM | POA: Insufficient documentation

## 2022-07-02 NOTE — Progress Notes (Unsigned)
Patient: Jeffery Fernandez  Service Category: E/M  Provider: Oswaldo Done, MD  DOB: 06-13-1945  DOS: 07/03/2022  Referring Provider: [name], MD  MRN: [id number]  Setting: Ambulatory outpatient  PCP: [name], MD  Type: New Patient  Specialty: Interventional Pain Management    Location: Office  Delivery: Face-to-face     Primary Reason(s) for Visit: Encounter for initial evaluation of one or more chronic problems (new to examiner) potentially causing chronic pain, and posing a threat to normal musculoskeletal function. (Level of risk: High) CC: No chief complaint on file.  HPI  Jeffery Fernandez is a 77 y.o. year old, male patient, who comes for the first time to our practice referred by [name], MD for our initial evaluation of his chronic pain. 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; Delayed surgical wound healing; Postoperative wound infection; Optic disc atrophy, right; 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); Cervical spondylosis with myelopathy; Compression fracture of lumbar vertebra (HCC); Wedge compression fracture of third lumbar vertebra, init (HCC); Type 2 diabetes mellitus, with long-term current use of insulin (HCC); Dyslipidemia associated with type 2 diabetes mellitus (HCC); Spondylitis (HCC); Chronic neck pain; Chronic low back pain; Neck pain; Major depressive disorder with single episode, in full remission (HCC); Inflamed seborrheic keratosis; Hypogonadism in male;  History of gout; Chronic kidney disease (CKD), stage IV (severe) (HCC); Bunion, left; Blind right eye; Bilateral sacroiliitis (HCC); OSA on CPAP; Delayed wound healing; Spinal stenosis of lumbar region; and Hypertension associated with diabetes (HCC) on their problem list. Today he comes in for evaluation of his No chief complaint on file.  Pain Assessment: Location:     Radiating:   Onset:   Duration:   Quality:   Severity:  /10 (subjective, self-reported pain score)  Effect on ADL:   Timing:   Modifying factors:   BP:    HR:    Onset and Duration: {Hx; Onset and Duration:210120511} Cause of pain: {Hx; Cause:210120521} Severity: {Pain Severity:210120502} Timing: {Symptoms; Timing:210120501} Aggravating Factors: {Causes; Aggravating pain factors:210120507} Alleviating Factors: {Causes; Alleviating Factors:210120500} Associated Problems: {Hx; Associated problems:210120515} Quality of Pain: {Hx; Symptom quality or Descriptor:210120531} Previous Examinations or Tests: {Hx; Previous examinations or test:210120529} Previous Treatments: {Hx; Previous Treatment:210120503}  Jeffery Fernandez is being evaluated for possible interventional pain management therapies for the treatment of his chronic pain.   ***  Jeffery Fernandez has been informed that this initial visit was an evaluation only.  On the follow up appointment I will go over the results, including ordered tests and available interventional therapies. At that time he will have the opportunity to decide whether to proceed with offered therapies or not. In the event that Jeffery Fernandez prefers avoiding interventional options, this will conclude our involvement in the case.  Medication management recommendations may be provided upon request.  Historic Controlled Substance Pharmacotherapy Review  PMP and historical list of controlled substances: ***  Most recently prescribed opioid analgesics:   *** MME/day: *** mg/day  Historical Monitoring: The  patient  reports no history of drug use. List of prior UDS Testing: No results found for: "MDMA", "COCAINSCRNUR", "PCPSCRNUR", "PCPQUANT", "CANNABQUANT", "THCU", "ETH", "  CBDTHCR", "D8THCCBX", "D9THCCBX" Historical Background Evaluation: [place] PMP: PDMP reviewed during this encounter. Review of the past 61-months conducted.             PMP NARX Score Report:  Narcotic: *** Sedative: *** Stimulant: *** [place] Department of public safety, offender search: Engineer, mining Information) Non-contributory Risk Assessment Profile: Aberrant behavior: None observed or detected today Risk factors for fatal opioid overdose: None identified today PMP NARX Overdose Risk Score: *** Fatal overdose hazard ratio (HR): Calculation deferred Non-fatal overdose hazard ratio (HR): Calculation deferred Risk of opioid abuse or dependence: 0.7-3.0% with doses ? 36 MME/day and 6.1-26% with doses ? 120 MME/day. Substance use disorder (SUD) risk level: See below Personal History of Substance Abuse (SUD-Substance use disorder):  Alcohol:    Illegal Drugs:    Rx Drugs:    ORT Risk Level calculation:    ORT Scoring interpretation table:  Score <3 = Low Risk for SUD  Score between 4-7 = Moderate Risk for SUD  Score >8 = High Risk for Opioid Abuse   PHQ-2 Depression Scale:  Total score:    PHQ-2 Scoring interpretation table: (Score and probability of major depressive disorder)  Score 0 = No depression  Score 1 = 15.4% Probability  Score 2 = 21.1% Probability  Score 3 = 38.4% Probability  Score 4 = 45.5% Probability  Score 5 = 56.4% Probability  Score 6 = 78.6% Probability   PHQ-9 Depression Scale:  Total score:    PHQ-9 Scoring interpretation table:  Score 0-4 = No depression  Score 5-9 = Mild depression  Score 10-14 = Moderate depression  Score 15-19 = Moderately severe depression  Score 20-27 = Severe depression (2.4 times higher risk of SUD and 2.89 times higher risk of overuse)   Pharmacologic Plan: As per  protocol, I have not taken over any controlled substance management, pending the results of ordered tests and/or consults.            Initial impression: Pending review of available data and ordered tests.  Meds   Current Outpatient Medications:    acetaminophen (TYLENOL) 500 MG tablet, Take 1,000 mg by mouth every 6 (six) hours as needed (for pain.)., Disp: , Rfl:    amLODipine (NORVASC) 5 MG tablet, Take 5 mg by mouth daily., Disp: , Rfl:    aspirin EC 81 MG tablet, Take 325 mg by mouth daily. Swallow whole., Disp: , Rfl:    atorvastatin (LIPITOR) 40 MG tablet, Take 20 mg by mouth at bedtime. , Disp: , Rfl:    carvedilol (COREG) 25 MG tablet, TAKE 1 TABLET BY MOUTH 2 TIMES DAILY WITH A MEAL. (Patient taking differently: Take 25 mg by mouth 2 (two) times daily with a meal.), Disp: 180 tablet, Rfl: 1   Cholecalciferol (D3-50 PO), Take by mouth., Disp: , Rfl:    glipiZIDE (GLUCOTROL XL) 10 MG 24 hr tablet, Take 10 mg by mouth 2 (two) times daily., Disp: , Rfl:    HYDROcodone-acetaminophen (NORCO/VICODIN) 5-325 MG tablet, Take 1 tablet by mouth every 4 (four) hours as needed for moderate pain., Disp: 30 tablet, Rfl: 0   insulin glargine (LANTUS) 100 UNIT/ML injection, Inject 40 Units into the skin daily., Disp: , Rfl:    lisinopril (ZESTRIL) 40 MG tablet, TAKE 1 TABLET BY MOUTH EVERY DAY (Patient taking differently: Take 40 mg by mouth daily.), Disp: 30 tablet, Rfl: 2   sertraline (ZOLOFT) 100 MG tablet, Take 100 mg by mouth daily., Disp: , Rfl:    terazosin (  HYTRIN) 5 MG capsule, TAKE 1 CAPSULE BY MOUTH EVERYDAY AT BEDTIME (Patient taking differently: Take 5 mg by mouth at bedtime.), Disp: 30 capsule, Rfl: 3   Turmeric (QC TUMERIC COMPLEX) 500 MG CAPS, Take by mouth., Disp: , Rfl:   Imaging Review  Cervical Imaging: Cervical MR wo contrast: Results for orders placed during the hospital encounter of 04/28/16  MR CERVICAL SPINE WO CONTRAST  Narrative [facility] [address] [place], [place] [address] [phone]  NEUROIMAGING REPORT   STUDY DATE: 04/28/2016 PATIENT NAME: SHUNSUKE MASCIARELLI DOB: 02/23/1945 MRN: [id number]  EXAM: MRI of the cervical spine  ORDERING CLINICIAN: Naomie Dean M.D. CLINICAL HISTORY: 77 year old man with right-sided weakness and neck pain COMPARISON FILMS: None  TECHNIQUE: MRI of the cervical spine was obtained utilizing 3 mm sagittal slices from the posterior fossa down to the T3-4 level with T1, T2 and inversion recovery views. In addition 4 mm axial slices from C2-3 down to T1-2 level were included with T2 and gradient echo views. CONTRAST: None IMAGING SITE: [facility], [address] [place], [place], [place]  FINDINGS: :  On sagittal images, the spine is imaged from above the cervicomedullary junction to T2.   There is a small T2 hyperintense focus within the right side of the spinal cord adjacent to the C4-C5 interspace.    There are endplate degenerative changes noted at C3-C4. There is straightening of the cervical curvature.  The discs and interspaces were further evaluated on axial views from C2 to T1 as follows:  C2-C3: There is mild central disc bulging and left facet hypertrophy. The left foramen is mildly narrowed but there does not appear to be any nerve root compression.  C3-C4: There is moderate spinal stenosis due to a central disc protrusion and bilateral uncovertebral spurring. The adjacent spinal cord has normal signal. There is moderate bilateral foraminal narrowing. There is no definite nerve root compression though there is some encroachment upon both of the C4 nerve roots.  C4-C5: There is moderately severe spinal stenosis due to central disc protrusion, uncovertebral spurring and mild right facet hypertrophy. There is a T2 hyperintense focus within the right side of the spinal cord that could represent a myelopathic score. There is moderately severe right and moderate left foraminal narrowing  which could lead to right C5 nerve root compression.  C5-C6: There is moderately severe spinal stenosis due to central disc protrusion, uncovertebral spurring and facet hypertrophy. There is severe right and moderately severe left foraminal narrowing. There is probable right C6 and possible left C6 nerve root compression.  C6-C7: There is mild spinal stenosis due to disc bulging and uncovertebral spurring. There is moderate bilateral foraminal narrowing. There does not appear to be nerve root compression.  C7-T1: The disc and interspace appear normal.  Impression This MRI of the cervical spine shows the following: 1.    There is myelopathic signal within the spinal cord adjacent to the C4-C5 interspace. At this level, there is moderately severe spinal stenosis and moderately severe right foraminal narrowing at could lead to right C5 nerve root compression. 2.    At C5-C6, there is moderately severe spinal stenosis, severe right foraminal narrowing and moderately severe left foraminal narrowing. There is probable right C6 and possible left C6 nerve root compression. 3.   There is also moderate spinal stenosis at C3-C4 and mild spinal stenosis at C6-C7 with less potential for nerve root compression.    INTERPRETING PHYSICIAN: Richard A. Epimenio Foot, MD, PhD Certified  in  Neuroimaging by American Society of Neuroimaging  Cervical MR wo contrast: No valid procedures specified. Cervical MR w/wo contrast: No results found for this or any previous visit.  Cervical MR w contrast: No results found for this or any previous visit.  Cervical CT wo contrast: No results found for this or any previous visit.  Cervical CT w/wo contrast: No results found for this or any previous visit.  Cervical CT w/wo contrast: No results found for this or any previous visit.  Cervical CT w contrast: No results found for this or any previous visit.  Cervical CT outside: No results found for this or any previous  visit.  Cervical DG 1 view: No results found for this or any previous visit.  Cervical DG 2-3 views: Results for orders placed during the hospital encounter of 06/26/16  DG Cervical Spine 2-3 Views  Narrative CLINICAL DATA:  Anterior cervical decompression and discectomy and fusion intraoperative radiographs.  EXAM: CERVICAL SPINE - 2-3 VIEW  COMPARISON:  Cervical spine MRI of April 28, 2016  FINDINGS: The image labeled #1 reveals a metallic needle projecting along the anterior aspect of the C2-3 disc space.  The image labeled #2 reveals the metallic needle projecting along the anterior 8 mm of the C3-4 disc space.  The image labeled #3 reveals an anterior fusion plate extending from C3 through C6 with intradiscal devices at C3-4, C4-5, and C5-6. Surgical sponges are present in the prevertebral soft tissues.  IMPRESSION: Lateral intraoperative radiographs with findings as described above.   Electronically Signed By: David  [place] M.D. On: 06/26/2016 11:21  Cervical DG F/E views: No results found for this or any previous visit.  Cervical DG 2-3 clearing views: No results found for this or any previous visit.  Cervical DG Bending/F/E views: No results found for this or any previous visit.  Cervical DG complete: No results found for this or any previous visit.  Cervical DG Myelogram views: No results found for this or any previous visit.  Cervical DG Myelogram views: No results found for this or any previous visit.  Cervical Discogram views: No results found for this or any previous visit.   Shoulder Imaging: Shoulder-R MR w contrast: No results found for this or any previous visit.  Shoulder-L MR w contrast: No results found for this or any previous visit.  Shoulder-R MR w/wo contrast: No results found for this or any previous visit.  Shoulder-L MR w/wo contrast: No results found for this or any previous visit.  Shoulder-R MR wo contrast: No results found for  this or any previous visit.  Shoulder-L MR wo contrast: No results found for this or any previous visit.  Shoulder-R CT w contrast: No results found for this or any previous visit.  Shoulder-L CT w contrast: No results found for this or any previous visit.  Shoulder-R CT w/wo contrast: No results found for this or any previous visit.  Shoulder-L CT w/wo contrast: No results found for this or any previous visit.  Shoulder-R CT wo contrast: No results found for this or any previous visit.  Shoulder-L CT wo contrast: No results found for this or any previous visit.  Shoulder-R DG Arthrogram: No results found for this or any previous visit.  Shoulder-L DG Arthrogram: No results found for this or any previous visit.  Shoulder-R DG 1 view: No results found for this or any previous visit.  Shoulder-L DG 1 view: No results found for this or any previous visit.  Shoulder-R DG:  No results found for this or any previous visit.  Shoulder-L DG: No results found for this or any previous visit.   Thoracic Imaging: Thoracic MR wo contrast: No results found for this or any previous visit.  Thoracic MR wo contrast: No valid procedures specified. Thoracic MR w/wo contrast: No results found for this or any previous visit.  Thoracic MR w contrast: No results found for this or any previous visit.  Thoracic CT wo contrast: No results found for this or any previous visit.  Thoracic CT w/wo contrast: No results found for this or any previous visit.  Thoracic CT w/wo contrast: No results found for this or any previous visit.  Thoracic CT w contrast: No results found for this or any previous visit.  Thoracic DG 2-3 views: No results found for this or any previous visit.  Thoracic DG 4 views: No results found for this or any previous visit.  Thoracic DG: No results found for this or any previous visit.  Thoracic DG w/swimmers view: No results found for this or any previous visit.  Thoracic  DG Myelogram views: No results found for this or any previous visit.  Thoracic DG Myelogram views: No results found for this or any previous visit.   Lumbosacral Imaging: Lumbar MR wo contrast: Results for orders placed during the hospital encounter of 04/28/20  MR LUMBAR SPINE WO CONTRAST  Narrative CLINICAL DATA:  Compression fracture. Recent fall 2 weeks ago. Tingling in both legs.  EXAM: MRI LUMBAR SPINE WITHOUT CONTRAST  TECHNIQUE: Multiplanar, multisequence MR imaging of the lumbar spine was performed. No intravenous contrast was administered.  COMPARISON:  Lumbar spine MRI 12/05/2017  FINDINGS: Segmentation:  Standard.  Alignment:  Physiologic.  Vertebrae: Incomplete burst fracture of L4 with approximately 50% height loss and diffuse bone marrow edema. There may be a small amount of epidural blood ventral to the spinal canal. The lumbar venous plexus is engorged at this level. There is an old compression deformity of L3 with vertebroplasty changes.  Conus medullaris and cauda equina: Conus extends to the L1 level. Conus and cauda equina appear normal.  Paraspinal and other soft tissues: Negative  Disc levels:  L1-L2: Normal disc space and facet joints. No spinal canal stenosis. No neural foraminal stenosis.  L2-L3: Small disc bulge. No spinal canal stenosis. No neural foraminal stenosis.  L3-L4: Disc desiccation with intermediate sized bulge. Mild spinal canal stenosis. Mild bilateral neural foraminal stenosis.  L4-L5: Disc bulging combination with venous plexus engorgement and possible small amount of ventral epidural blood at L4 resulting in severe spinal canal stenosis. Severe bilateral neural foraminal stenosis. There is mass effect on the cauda equina at this level.  L5-S1: Small disc bulge. No spinal canal stenosis. Mild left neural foraminal stenosis.  Visualized sacrum: Normal.  IMPRESSION: 1. Incomplete burst fracture of L4 with  approximately 50% height loss and diffuse bone marrow edema. Possible small amount of ventral epidural blood. 2. 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 Mass effect on the cauda equina. 3. Old L3 compression fracture with vertebroplasty changes.  These results will be called to the ordering clinician or representative by the Radiologist Assistant, and communication documented in the PACS or [employer].   Electronically Signed By: Deatra Robinson M.D. On: 04/29/2020 00:07  Lumbar MR wo contrast: No valid procedures specified. Lumbar MR w/wo contrast: No results found for this or any previous visit.  Lumbar MR w/wo contrast: No results found for this or any previous visit.  Lumbar MR w contrast: No results found for this or any previous visit.  Lumbar CT wo contrast: No results found for this or any previous visit.  Lumbar CT w/wo contrast: No results found for this or any previous visit.  Lumbar CT w/wo contrast: No results found for this or any previous visit.  Lumbar CT w contrast: No results found for this or any previous visit.  Lumbar DG 1V: Results for orders placed during the hospital encounter of 05/11/20  DG Lumbar Spine 1 View  Narrative CLINICAL DATA:  L4-5 decompression  EXAM: LUMBAR SPINE - 1 VIEW  COMPARISON:  Lumbar radiographs April 20, 2020; lumbar MRI April 28, 2020  FINDINGS: Metallic probe tip is posterior to the superior most aspect of the S1 vertebral body. There is chronic anterior wedging at L3, status post kyphoplasty. There is slight anterior wedging at L4, stable. No spondylolisthesis. No appreciable disc space narrowing. There is aortic atherosclerosis.  IMPRESSION: Metallic probe tip is posterior to the proximal aspect of S1. Previous kyphoplasty at L3. Anterior wedging at L3 and L4 is stable.  Aortic Atherosclerosis (ICD10-I70.0).   Electronically Signed By: Bretta Bang III M.D. On: 05/11/2020  15:08  Lumbar DG 1V (Clearing): No results found for this or any previous visit.  Lumbar DG 2-3V (Clearing): No results found for this or any previous visit.  Lumbar DG 2-3 views: Results for orders placed during the hospital encounter of 05/11/20  DG Lumbar Spine 2-3 Views  Narrative CLINICAL DATA:  Surgical fusion of L4-5.  EXAM: LUMBAR SPINE - 2-3 VIEW; DG C-ARM 1-60 MIN  FLUOROSCOPY TIME:  58 seconds.  COMPARISON:  MRI of April 28, 2020.  FINDINGS: Two intraoperative fluoroscopic images were obtained of the lower lumbar spine. These images demonstrate placement of bilateral intrapedicular screws at L4 and L5.  IMPRESSION: Fluoroscopic guidance provided during lumbar surgery.   Electronically Signed By: Lupita Raider M.D. On: 05/11/2020 16:18  Lumbar DG (Complete) 4+V: No results found for this or any previous visit.        Lumbar DG F/E views: No results found for this or any previous visit.        Lumbar DG Bending views: No results found for this or any previous visit.        Lumbar DG Myelogram views: No results found for this or any previous visit.  Lumbar DG Myelogram: No results found for this or any previous visit.  Lumbar DG Myelogram: No results found for this or any previous visit.  Lumbar DG Myelogram: No results found for this or any previous visit.  Lumbar DG Myelogram Lumbosacral: No results found for this or any previous visit.  Lumbar DG Diskogram views: No results found for this or any previous visit.  Lumbar DG Diskogram views: No results found for this or any previous visit.  Lumbar DG Epidurogram OP: No results found for this or any previous visit.  Lumbar DG Epidurogram IP: No valid procedures specified.  Sacroiliac Joint Imaging: Sacroiliac Joint DG: No results found for this or any previous visit.  Sacroiliac Joint MR w/wo contrast: No results found for this or any previous visit.  Sacroiliac Joint MR wo contrast: No results  found for this or any previous visit.   Spine Imaging: Whole Spine DG Myelogram views: No results found for this or any previous visit.  Whole Spine MR Mets screen: No results found for this or any previous visit.  Whole Spine MR Mets screen: No results  found for this or any previous visit.  Whole Spine MR w/wo: No results found for this or any previous visit.  MRA Spinal Canal w/ cm: No results found for this or any previous visit.  MRA Spinal Canal wo/ cm: No valid procedures specified. MRA Spinal Canal w/wo cm: No results found for this or any previous visit.  Spine Outside MR Films: No results found for this or any previous visit.  Spine Outside CT Films: No results found for this or any previous visit.  CT-Guided Biopsy: No results found for this or any previous visit.  CT-Guided Needle Placement: No results found for this or any previous visit.  DG Spine outside: No results found for this or any previous visit.  IR Spine outside: No results found for this or any previous visit.  NM Spine outside: No results found for this or any previous visit.   Hip Imaging: Hip-R MR w contrast: No results found for this or any previous visit.  Hip-L MR w contrast: No results found for this or any previous visit.  Hip-R MR w/wo contrast: No results found for this or any previous visit.  Hip-L MR w/wo contrast: No results found for this or any previous visit.  Hip-R MR wo contrast: No results found for this or any previous visit.  Hip-L MR wo contrast: No results found for this or any previous visit.  Hip-R CT w contrast: No results found for this or any previous visit.  Hip-L CT w contrast: No results found for this or any previous visit.  Hip-R CT w/wo contrast: No results found for this or any previous visit.  Hip-L CT w/wo contrast: No results found for this or any previous visit.  Hip-R CT wo contrast: No results found for this or any previous visit.  Hip-L CT wo  contrast: No results found for this or any previous visit.  Hip-R DG 2-3 views: No results found for this or any previous visit.  Hip-L DG 2-3 views: No results found for this or any previous visit.  Hip-R DG Arthrogram: No results found for this or any previous visit.  Hip-L DG Arthrogram: No results found for this or any previous visit.  Hip-B DG Bilateral: No results found for this or any previous visit.   Knee Imaging: Knee-R MR w contrast: No results found for this or any previous visit.  Knee-L MR w/o contrast: No results found for this or any previous visit.  Knee-R MR w/wo contrast: No results found for this or any previous visit.  Knee-L MR w/wo contrast: No results found for this or any previous visit.  Knee-R MR wo contrast: No results found for this or any previous visit.  Knee-L MR wo contrast: No results found for this or any previous visit.  Knee-R CT w contrast: No results found for this or any previous visit.  Knee-L CT w contrast: No results found for this or any previous visit.  Knee-R CT w/wo contrast: No results found for this or any previous visit.  Knee-L CT w/wo contrast: No results found for this or any previous visit.  Knee-R CT wo contrast: No results found for this or any previous visit.  Knee-L CT wo contrast: No results found for this or any previous visit.  Knee-R DG 1-2 views: No results found for this or any previous visit.  Knee-L DG 1-2 views: No results found for this or any previous visit.  Knee-R DG 3 views: No results found for this  or any previous visit.  Knee-L DG 3 views: No results found for this or any previous visit.  Knee-R DG 4 views: No results found for this or any previous visit.  Knee-L DG 4 views: No results found for this or any previous visit.  Knee-R DG Arthrogram: No results found for this or any previous visit.  Knee-L DG Arthrogram: No results found for this or any previous visit.   Ankle  Imaging: Ankle-R DG Complete: No results found for this or any previous visit.  Ankle-L DG Complete: No results found for this or any previous visit.   Foot Imaging: Foot-R DG Complete: No results found for this or any previous visit.  Foot-L DG Complete: No results found for this or any previous visit.   Elbow Imaging: Elbow-R DG Complete: No results found for this or any previous visit.  Elbow-L DG Complete: No results found for this or any previous visit.   Wrist Imaging: Wrist-R DG Complete: No results found for this or any previous visit.  Wrist-L DG Complete: No results found for this or any previous visit.   Hand Imaging: Hand-R DG Complete: No results found for this or any previous visit.  Hand-L DG Complete: No results found for this or any previous visit.   Complexity Note: Imaging results reviewed.                         ROS  Cardiovascular: {Hx; Cardiovascular History:210120525} Pulmonary or Respiratory: {Hx; Pumonary and/or Respiratory History:210120523} Neurological: {Hx; Neurological:210120504} Psychological-Psychiatric: {Hx; Psychological-Psychiatric History:210120512} Gastrointestinal: {Hx; Gastrointestinal:210120527} Genitourinary: {Hx; Genitourinary:210120506} Hematological: {Hx; Hematological:210120510} Endocrine: {Hx; Endocrine history:210120509} Rheumatologic: {Hx; Rheumatological:210120530} Musculoskeletal: {Hx; Musculoskeletal:210120528} Work History: {Hx; Work history:210120514}  Allergies  Jeffery Fernandez is allergic to penicillins.  Laboratory Chemistry Profile   Renal Lab Results  Component Value Date   BUN 20 06/08/2020   CREATININE 1.53 (H) 06/08/2020   BCR 16 10/30/2016   GFR 68.10 06/08/2010   GFRAA 47 (L) 01/14/2018   GFRNONAA 47 (L) 06/08/2020   PROTEINUR NEGATIVE 05/30/2020     Electrolytes Lab Results  Component Value Date   NA 135 06/08/2020   K 3.4 (L) 06/08/2020   CL 105 06/08/2020   CALCIUM 8.0 (L) 06/08/2020      Hepatic Lab Results  Component Value Date   AST 54 (H) 05/30/2020   ALT 47 (H) 05/30/2020   ALBUMIN 2.3 (L) 05/30/2020   ALKPHOS 90 05/30/2020     ID Lab Results  Component Value Date   SARSCOV2NAA NEGATIVE 05/30/2020   STAPHAUREUS NEGATIVE 05/11/2020   MRSAPCR NEGATIVE 05/11/2020     Bone No results found for: "VD25OH", "VD125OH2TOT", "LK4401UU7", "OZ3664QI3", "25OHVITD1", "25OHVITD2", "25OHVITD3", "TESTOFREE", "TESTOSTERONE"   Endocrine Lab Results  Component Value Date   GLUCOSE 141 (H) 06/08/2020   GLUCOSEU NEGATIVE 05/30/2020   HGBA1C 9.3 (H) 05/11/2020     Neuropathy Lab Results  Component Value Date   HGBA1C 9.3 (H) 05/11/2020     CNS No results found for: "COLORCSF", "APPEARCSF", "RBCCOUNTCSF", "WBCCSF", "POLYSCSF", "LYMPHSCSF", "EOSCSF", "PROTEINCSF", "GLUCCSF", "JCVIRUS", "CSFOLI", "IGGCSF", "LABACHR", "ACETBL"   Inflammation (CRP: Acute  ESR: Chronic) Lab Results  Component Value Date   CRP 3.4 (H) 05/30/2020   ESRSEDRATE 72 (H) 05/30/2020   LATICACIDVEN 1.3 05/30/2020     Rheumatology No results found for: "RF", "ANA", "LABURIC", "URICUR", "LYMEIGGIGMAB", "LYMEABIGMQN", "HLAB27"   Coagulation Lab Results  Component Value Date   INR 1.2 05/30/2020   LABPROT 15.5 (H) 05/30/2020  PLT 274 05/30/2020     Cardiovascular Lab Results  Component Value Date   TROPONINI <0.03 11/12/2014   HGB 10.2 (L) 05/30/2020   HCT 31.6 (L) 05/30/2020     Screening Lab Results  Component Value Date   SARSCOV2NAA NEGATIVE 05/30/2020   STAPHAUREUS NEGATIVE 05/11/2020   MRSAPCR NEGATIVE 05/11/2020     Cancer No results found for: "CEA", "CA125", "LABCA2"   Allergens No results found for: "ALMOND", "APPLE", "ASPARAGUS", "AVOCADO", "BANANA", "BARLEY", "BASIL", "BAYLEAF", "GREENBEAN", "LIMABEAN", "WHITEBEAN", "BEEFIGE", "REDBEET", "BLUEBERRY", "BROCCOLI", "CABBAGE", "MELON", "CARROT", "CASEIN", "CASHEWNUT", "CAULIFLOWER", "CELERY"     Note: Lab results  reviewed.  PFSH  Drug: Jeffery Fernandez  reports no history of drug use. Alcohol:  reports that he does not currently use alcohol. Tobacco:  reports that he quit smoking about 36 years ago. His smoking use included cigarettes. He has a 37.50 pack-year smoking history. He has never used smokeless tobacco. Medical:  has a past medical history of Arrhythmia, Arthritis, Cataract, Chest pain, unspecified, Chronic airway obstruction, not elsewhere classified, CKD (chronic kidney disease), Colon polyps, Depression, Diabetes mellitus, Diverticulosis, Gout, Heart murmur, History of kidney stones, Hyperlipemia, Hypertension, Nephrolithiasis, Normocytic anemia (04/23/2017), Obstructive sleep apnea (adult) (pediatric), Other and unspecified hyperlipidemia, Other left bundle branch block, Retinal vascular occlusion, unspecified, Shortness of breath, Unspecified sleep apnea, Vision loss of right eye, and White coat syndrome with diagnosis of hypertension (09/19/2017). Family: family history includes Arthritis/Rheumatoid in his cousin; Atrial fibrillation in his brother; Diabetes in an other family member; Heart attack (age of onset: 46) in his brother; Heart disease in his father; Parkinson's disease in his brother; Stroke in his father; Uterine cancer in his mother.  Past Surgical History:  Procedure Laterality Date   ANTERIOR CERVICAL DECOMP/DISCECTOMY FUSION N/A 06/26/2016   Procedure: Re-Exploration of Cervical Wound;  Surgeon: Donalee Citrin, MD;  Location: [facility] OR;  Service: Neurosurgery;  Laterality: N/A;   ANTERIOR CERVICAL DECOMP/DISCECTOMY FUSION N/A 06/26/2016   Procedure: ANTERIOR CERVICAL DECOMPRESSION/DISCECTOMY FUSION, INTERBODY PROSTESIS, PLATE, CERVICAL THREE CERVICAL FOUR, CERVICAL FOUR CERVICAL FIVE CERVICAL SIX;  Surgeon: Tressie Stalker, MD;  Location: [facility] OR;  Service: Neurosurgery;  Laterality: N/A;   APPLICATION OF WOUND VAC  06/05/2020   Procedure: APPLICATION OF WOUND VAC;  Surgeon: Tressie Stalker, MD;   Location: [facility] OR;  Service: Neurosurgery;;   BACK SURGERY     CATARACT EXTRACTION Bilateral    KIDNEY STONE SURGERY     KYPHOPLASTY N/A 01/19/2018   Procedure: KYPHOPLASTY LUMBAR THREE;  Surgeon: Tressie Stalker, MD;  Location: [facility] OR;  Service: Neurosurgery;  Laterality: N/A;  KYPHOPLASTY LUMBAR THREE   LUMBAR WOUND DEBRIDEMENT N/A 06/05/2020   Procedure: LUMBAR WOUND DEBRIDEMENT;  Surgeon: Tressie Stalker, MD;  Location: [facility] OR;  Service: Neurosurgery;  Laterality: N/A;   Active Ambulatory Problems    Diagnosis Date Noted   OSA (obstructive sleep apnea) 06/10/2010   Hypertension 06/10/2010   COPD (chronic obstructive pulmonary disease) (HCC) 06/10/2010   Hyperlipidemia 06/10/2010   Retinal artery occlusion 06/10/2010   Coronary artery disease 07/10/2010   Personal history of colonic polyps 06/14/2011   Cervical spondylosis with myelopathy and radiculopathy 06/26/2016   Spinal stenosis of cervical region 06/26/2016   Normocytic anemia 04/23/2017   Heme positive stool 04/23/2017   White coat syndrome with diagnosis of hypertension 09/19/2017   Lumbar compression fracture (HCC) 01/19/2018   Branch retinal vein occlusion with macular edema of left eye 05/05/2019   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   Delayed surgical  wound healing 05/30/2020   Postoperative wound infection 06/06/2020   Optic disc atrophy, right 02/05/2021   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) 04/16/2021   Cervical spondylosis with myelopathy 05/10/2016   Compression fracture of lumbar vertebra (HCC) 12/08/2017   Wedge compression fracture of third lumbar vertebra, init (HCC) 01/07/2021   Type 2 diabetes mellitus, with long-term current use of insulin (HCC) 07/12/2020   Dyslipidemia associated with type 2 diabetes mellitus (HCC) 07/12/2020   Spondylitis (HCC) 07/02/2022   Chronic neck pain 05/10/2016   Chronic low back pain 07/02/2022   Neck pain 10/08/2020   Major  depressive disorder with single episode, in full remission (HCC) 07/12/2020   Inflamed seborrheic keratosis 07/02/2022   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   History of gout 07/12/2020   Chronic kidney disease (CKD), stage IV (severe) (HCC) 01/10/2021   Bunion, left 01/15/2019   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   Bilateral sacroiliitis (HCC) 07/02/2022   OSA on CPAP 06/10/2010   Delayed wound healing 07/02/2022   Spinal stenosis of lumbar region 05/09/2020   Hypertension associated with diabetes (HCC) 09/13/2015   Resolved Ambulatory Problems    Diagnosis Date Noted   Dyspnea 06/10/2010   Past Medical History:  Diagnosis Date   Arrhythmia    Arthritis    Cataract    Chest pain, unspecified    Chronic airway obstruction, not elsewhere classified    CKD (chronic kidney disease)    Colon polyps    Depression    Diabetes mellitus    Diverticulosis    Gout    Heart murmur    History of kidney stones    Hyperlipemia    Nephrolithiasis    Obstructive sleep apnea (adult) (pediatric)    Other and unspecified hyperlipidemia    Other left bundle branch block    Retinal vascular occlusion, unspecified    Shortness of breath    Unspecified sleep apnea    Vision loss of right eye    Constitutional Exam  General appearance: Well nourished, well developed, and well hydrated. In no apparent acute distress There were no vitals filed for this visit. BMI Assessment: Estimated body mass index is 28.84 kg/m as calculated from the following:   Height as of 07/24/21: 5\' 10"  (1.778 m).   Weight as of 07/24/21: 201 lb (91.2 kg).  BMI interpretation table: BMI level Category Range association with higher incidence of chronic pain  <18 kg/m2 Underweight   18.5-24.9 kg/m2 Ideal body weight   25-29.9 kg/m2 Overweight Increased incidence by 20%  30-34.9 kg/m2 Obese (Class I) Increased incidence by 68%  35-39.9 kg/m2 Severe obesity (Class II) Increased incidence by 136%  >40 kg/m2 Extreme obesity  (Class III) Increased incidence by 254%   Patient's current BMI Ideal Body weight  There is no height or weight on file to calculate BMI. Patient weight not recorded   BMI Readings from Last 4 Encounters:  07/24/21 28.84 kg/m  05/15/21 28.96 kg/m  06/19/20 28.53 kg/m  05/30/20 29.26 kg/m   Wt Readings from Last 4 Encounters:  07/24/21 201 lb (91.2 kg)  05/15/21 201 lb 12.8 oz (91.5 kg)  06/19/20 196 lb (88.9 kg)  05/30/20 201 lb (91.2 kg)    Psych/Mental status: Alert, oriented x 3 (person, place, & time)       Eyes: PERLA Respiratory: No evidence of acute respiratory distress  Assessment  Primary Diagnosis & Pertinent Problem List: There were no encounter diagnoses.  Visit Diagnosis (New problems to examiner):  No diagnosis found. Plan of Care (Initial workup plan)  Note: Jeffery Fernandez was reminded that as per protocol, today's visit has been an evaluation only. We have not taken over the patient's controlled substance management.  Problem-specific plan: No problem-specific Assessment & Plan notes found for this encounter.  Lab Orders  No laboratory test(s) ordered today   Imaging Orders  No imaging studies ordered today   Referral Orders  No referral(s) requested today   Procedure Orders    No procedure(s) ordered today   Pharmacotherapy (current): Medications ordered:  No orders of the defined types were placed in this encounter.  Medications administered during this visit: Theus H. Bornemann had no medications administered during this visit.   Analgesic Pharmacotherapy:  Opioid Analgesics: For patients currently taking or requesting to take opioid analgesics, in accordance with [facility] Guidelines, we will assess their risks and indications for the use of these substances. After completing our evaluation, we may offer recommendations, but we no longer take patients for medication management. The prescribing physician will ultimately decide,  based on his/her training and level of comfort whether to adopt any of the recommendations, including whether or not to prescribe such medicines.  Membrane stabilizer: To be determined at a later time  Muscle relaxant: To be determined at a later time  NSAID: To be determined at a later time  Other analgesic(s): To be determined at a later time   Interventional management options: Jeffery Fernandez was informed that there is no guarantee that he would be a candidate for interventional therapies. The decision will be based on the results of diagnostic studies, as well as Jeffery Fernandez risk profile.  Procedure(s) under consideration:  Pending results of ordered studies      Interventional Therapies  Risk Factors  Considerations:     Planned  Pending:      Under consideration:   Pending   Completed:   None at this time   Therapeutic  Palliative (PRN) options:   None established   Completed by other providers:   None reported       Provider-requested follow-up: No follow-ups on file.  Future Appointments  Date Time Provider Department Center  07/03/2022 11:00 AM Delano Metz, MD [facility] None    Duration of encounter: *** minutes.  Total time on encounter, as per AMA guidelines included both the face-to-face and non-face-to-face time personally spent by the physician and/or other qualified health care professional(s) on the day of the encounter (includes time in activities that require the physician or other qualified health care professional and does not include time in activities normally performed by clinical staff). Physician's time may include the following activities when performed: Preparing to see the patient (e.g., pre-charting review of records, searching for previously ordered imaging, lab work, and nerve conduction tests) Review of prior analgesic pharmacotherapies. Reviewing PMP Interpreting ordered tests (e.g., lab work, imaging, nerve conduction  tests) Performing post-procedure evaluations, including interpretation of diagnostic procedures Obtaining and/or reviewing separately obtained history Performing a medically appropriate examination and/or evaluation Counseling and educating the patient/family/caregiver Ordering medications, tests, or procedures Referring and communicating with other health care professionals (when not separately reported) Documenting clinical information in the electronic or other health record Independently interpreting results (not separately reported) and communicating results to the patient/ family/caregiver Care coordination (not separately reported)  Note by: Oswaldo Done, MD (TTS technology used. I apologize for any typographical errors that were not detected and corrected.) Date: 07/03/2022; Time: 9:49 AM

## 2022-07-03 ENCOUNTER — Ambulatory Visit
Admission: RE | Admit: 2022-07-03 | Discharge: 2022-07-03 | Disposition: A | Discharge status: Home / Self Care | Payer: HMO | Source: Ambulatory Visit | Attending: Pain Medicine | Admitting: Pain Medicine

## 2022-07-03 ENCOUNTER — Encounter: Payer: Self-pay | Admitting: Pain Medicine

## 2022-07-03 ENCOUNTER — Ambulatory Visit (HOSPITAL_BASED_OUTPATIENT_CLINIC_OR_DEPARTMENT_OTHER): Payer: HMO | Admitting: Pain Medicine

## 2022-07-03 VITALS — BP 149/77 | HR 64 | Temp 99.5°F | Ht 70.0 in | Wt 201.0 lb

## 2022-07-03 DIAGNOSIS — Z8781 Personal history of (healed) traumatic fracture: Secondary | ICD-10-CM | POA: Insufficient documentation

## 2022-07-03 DIAGNOSIS — S32040S Wedge compression fracture of fourth lumbar vertebra, sequela: Secondary | ICD-10-CM

## 2022-07-03 DIAGNOSIS — Z79899 Other long term (current) drug therapy: Secondary | ICD-10-CM

## 2022-07-03 DIAGNOSIS — S32030S Wedge compression fracture of third lumbar vertebra, sequela: Secondary | ICD-10-CM

## 2022-07-03 DIAGNOSIS — G894 Chronic pain syndrome: Secondary | ICD-10-CM

## 2022-07-03 DIAGNOSIS — M899 Disorder of bone, unspecified: Secondary | ICD-10-CM | POA: Insufficient documentation

## 2022-07-03 DIAGNOSIS — R937 Abnormal findings on diagnostic imaging of other parts of musculoskeletal system: Secondary | ICD-10-CM | POA: Insufficient documentation

## 2022-07-03 DIAGNOSIS — Z789 Other specified health status: Secondary | ICD-10-CM | POA: Insufficient documentation

## 2022-07-03 NOTE — Progress Notes (Signed)
Safety precautions to be maintained throughout the outpatient stay will include: orient to surroundings, keep bed in low position, maintain call bell within reach at all times, provide assistance with transfer out of bed and ambulation.  

## 2022-07-03 NOTE — Patient Instructions (Signed)
____________________________________________________________________________________________  New Patients  Welcome to [facility].   Initial Visit The first or initial visit consists of an evaluation only.   Interventional pain management.  We offer therapies other than opioid controlled substances to manage chronic pain. These include, but are not limited to, diagnostic, therapeutic, and palliative specialized injection therapies (i.e.: Epidural Steroids, Facet Blocks, etc.). We specialize in a variety of nerve blocks as well as radiofrequency treatments. We offer pain implant evaluations and trials, as well as follow up management. In addition we also provide a variety joint injections, including Viscosupplementation (AKA: Gel Therapy).  Prescription Pain Medication. We specialize in alternatives to opioids. We can provide evaluations and recommendations for/of pharmacologic therapies based on CDC Guidelines.  We no longer take patients for long-term medication management. We will not be taking over your pain medications.  ____________________________________________________________________________________________    ____________________________________________________________________________________________  Patient Information update  To: All of our patients.  Re: Name change.  It has been made official that our current name, "[facility]"   will soon be changed to "[facility]".   The purpose of this change is to eliminate any confusion created by the concept of our practice being a "Medication Management Pain Clinic". In the past this has led to the misconception that we treat pain primarily by the use of prescription medications.  Nothing can be farther from the truth.   Understanding PAIN MANAGEMENT: To  further understand what our practice does, you first have to understand that "Pain Management" is a subspecialty that requires additional training once a physician has completed their specialty training, which can be in either Anesthesia, Neurology, Psychiatry, or Physical Medicine and Rehabilitation (PMR). Each one of these contributes to the final approach taken by each physician to the management of their patient's pain. To be a "Pain Management Specialist" you must have first completed one of the specialty trainings below.  Anesthesiologists - trained in clinical pharmacology and interventional techniques such as nerve blockade and regional as well as central neuroanatomy. They are trained to block pain before, during, and after surgical interventions.  Neurologists - trained in the diagnosis and pharmacological treatment of complex neurological conditions, such as Multiple Sclerosis, Parkinson's, spinal cord injuries, and other systemic conditions that may be associated with symptoms that may include but are not limited to pain. They tend to rely primarily on the treatment of chronic pain using prescription medications.  Psychiatrist - trained in conditions affecting the psychosocial wellbeing of patients including but not limited to depression, anxiety, schizophrenia, personality disorders, addiction, and other substance use disorders that may be associated with chronic pain. They tend to rely primarily on the treatment of chronic pain using prescription medications.   Physical Medicine and Rehabilitation (PMR) physicians, also known as physiatrists - trained to treat a wide variety of medical conditions affecting the brain, spinal cord, nerves, bones, joints, ligaments, muscles, and tendons. Their training is primarily aimed at treating patients that have suffered injuries that have caused severe physical impairment. Their training is primarily aimed at the physical therapy and rehabilitation of those  patients. They may also work alongside orthopedic surgeons or neurosurgeons using their expertise in assisting surgical patients to recover after their surgeries.  INTERVENTIONAL PAIN MANAGEMENT is sub-subspecialty of Pain Management.  Our physicians are Board-certified in Anesthesia, Pain Management, and Interventional Pain Management.  This meaning that not only have they been trained   and Board-certified in their specialty of Anesthesia, and subspecialty of Pain Management, but they have also received further training in the sub-subspecialty of Interventional Pain Management, in order to become Board-certified as INTERVENTIONAL PAIN MANAGEMENT SPECIALIST.    Mission: Our goal is to use our skills in  INTERVENTIONAL PAIN MANAGEMENT as alternatives to the chronic use of prescription opioid medications for the treatment of pain. To make this more clear, we have changed our name to reflect what we do and offer. We will continue to offer medication management assessment and recommendations, but we will not be taking over any patient's medication management.  ____________________________________________________________________________________________     

## 2022-07-04 LAB — COMP. METABOLIC PANEL (12)
Albumin: 4.1 g/dL (ref 3.8–4.8)
BUN/Creatinine Ratio: 8 — ABNORMAL LOW (ref 10–24)
Chloride: 105 mmol/L (ref 96–106)
Globulin, Total: 3.3 g/dL (ref 1.5–4.5)
Potassium: 4.2 mmol/L (ref 3.5–5.2)
Sodium: 142 mmol/L (ref 134–144)

## 2022-07-04 LAB — VITAMIN B12: Vitamin B-12: 517 pg/mL (ref 232–1245)

## 2022-07-04 LAB — 25-HYDROXY VITAMIN D LCMS D2+D3

## 2022-07-06 LAB — COMPLIANCE DRUG ANALYSIS, UR

## 2022-07-10 LAB — COMP. METABOLIC PANEL (12)
AST: 15 IU/L (ref 0–40)
Albumin/Globulin Ratio: 1.2 (ref 1.2–2.2)
Alkaline Phosphatase: 147 IU/L — ABNORMAL HIGH (ref 44–121)
BUN: 22 mg/dL (ref 8–27)
Bilirubin Total: 0.3 mg/dL (ref 0.0–1.2)
Calcium: 9.1 mg/dL (ref 8.6–10.2)
Creatinine, Ser: 2.65 mg/dL — ABNORMAL HIGH (ref 0.76–1.27)
Glucose: 115 mg/dL — ABNORMAL HIGH (ref 70–99)
Total Protein: 7.4 g/dL (ref 6.0–8.5)
eGFR: 24 mL/min/{1.73_m2} — ABNORMAL LOW (ref >59)

## 2022-07-10 LAB — C-REACTIVE PROTEIN: CRP: 5 mg/L (ref 0–10)

## 2022-07-10 LAB — 25-HYDROXY VITAMIN D LCMS D2+D3
25-Hydroxy, Vitamin D-3: 32 ng/mL
25-Hydroxy, Vitamin D: 32 ng/mL

## 2022-07-10 LAB — SEDIMENTATION RATE: Sed Rate: 47 mm/hr — ABNORMAL HIGH (ref 0–30)

## 2022-07-10 LAB — MAGNESIUM: Magnesium: 2.3 mg/dL (ref 1.6–2.3)

## 2022-07-16 DIAGNOSIS — M549 Dorsalgia, unspecified: Secondary | ICD-10-CM | POA: Insufficient documentation

## 2022-07-16 NOTE — Progress Notes (Unsigned)
PROVIDER NOTE: Information contained herein reflects review and annotations entered in association with encounter. Interpretation of such information and data should be left to medically-trained personnel. Information provided to patient can be located elsewhere in the medical record under "Patient Instructions". Document created using STT-dictation technology, any transcriptional errors that may result from process are unintentional.    Patient: Jeffery Fernandez  Service Category: E/M  Provider: Oswaldo Done, MD  DOB: [birth year]  DOS: 07/17/2022  Referring Provider: [name], MD  MRN: [id number]  Specialty: Interventional Pain Management  PCP: [name], MD  Type: Established Patient  Setting: Ambulatory outpatient    Location: Office  Delivery: Face-to-face     Primary Reason(s) for Visit: Encounter for evaluation before starting new chronic pain management plan of care (Level of risk: moderate) CC: No chief complaint on file.  HPI  Jeffery Fernandez is a 77 y.o. year old, male patient, who comes today for a follow-up evaluation to review the test results and decide on a treatment plan. 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); Cervical spondylosis with myelopathy; Wedge compression fracture of third lumbar vertebra, init (HCC); Type 2 diabetes mellitus, with long-term current use of insulin (HCC);  Dyslipidemia associated with type 2 diabetes mellitus (HCC); Spondylitis (HCC); Chronic neck pain; Chronic low back pain (Bilateral) (L>R) w/o sciatica; Neck pain; Major depressive disorder with single episode, in full remission (HCC); Inflamed seborrheic keratosis; Hypogonadism in male; History of gout; Chronic kidney disease (CKD), stage IV (severe) (HCC); Bunion, left; Blind right eye; Bilateral sacroiliitis (HCC); OSA on CPAP; Delayed wound healing; Spinal stenosis of lumbar region; Hypertension associated with diabetes (HCC); Chronic pain syndrome; Pharmacologic therapy; Disorder of skeletal system; Problems influencing health status; Abnormal MRI, lumbar spine (04/29/2020); Compression fracture of L4 lumbar vertebra, sequela; Compression fracture of L3 lumbar vertebra, sequela; History of vertebral fracture; and Spinal column pain on their problem list. His primarily concern today is the No chief complaint on file.  Pain Assessment: Location:     Radiating:   Onset:   Duration:   Quality:   Severity:  /10 (subjective, self-reported pain score)  Effect on ADL:   Timing:   Modifying factors:   BP:    HR:    Jeffery Fernandez comes in today for a follow-up visit after his initial evaluation on 07/03/2022. Today we went over the results of his tests. These were explained in "Layman's terms". During today's appointment we went over my diagnostic impression, as well as the proposed treatment plan.  Review of initial evaluation (07/03/2022): "The patient's primary area of pain is that of the lower back  (Bilateral) (L>R).  According to the patient he fell and broke his back requiring surgery around April 2022.  He had to undergo 2 surgeries with the second one being secondary to a complication with the first where there was an infection.  It took some time for his back to heal but he has a history of an L3 and an L4 vertebral body fractures.  He also indicates having had a fusion.  He does not have any recent  x-rays of the area and he refers that he currently takes Tylenol for the pain secondary to the fact that he has some chronic kidney disease.  He also indicates having received a prescription for hydrocodone by Dr. Lovell Sheehan around May 2023.  At that time he received a total of 40 pills and he still has 3 left.  He refers being scared of the use of this type of medication and he also indicates that he is intolerant to oxycodone refer him that when he took it he had hallucinations.  Currently he takes gabapentin 100 mg p.o. 3 times daily for a total of 300 mg/day and he refers that he has noticed improvement of his pain.  At this point he has no side effects of the medication.  I certainly think that he could benefit from slowly increasing the gabapentin at a rate of 100 mg every 3 weeks, checking for any type of side effects.  This has to be done in conjunction with the physician that is taking care of his kidneys since chronic kidney disease would limit the maximum amount of medicine that he can take.  So far he seems to be an appropriate candidate for the hydrocodone, which he seems to use sparingly.  According to my review of records the patient's last set of x-rays of the lumbar spine were done around 05/11/2020.  He also has an MRI that was done on 04/28/2020.  Today we will be updating some of these x-rays.   He refers having had some surgery in the cervical region, but he is currently not having any problems with that area.   Physical exam today demonstrated the patient to be able to heel walk and toe walk without any problems.  He denies any lower extremity pain, numbness or weakness associated with any type of radiculopathy.  Range of motion of the lumbar spine is somewhat limited, especially on hyperextension.  Hyperextension and rotation maneuver did trigger the pain in his lower back.  This was concordant with the [facility] maneuver which also would suggest bilateral, ipsilateral, facet joint arthralgia.  He  does present with decreased range of motion of the lumbar spine.  This range of motion also seems to be somewhat painful."  ***  Patient presented with interventional treatment options. Mr. Mayotte was informed that I will not be providing medication management. Pharmacotherapy evaluation including recommendations may be offered, if specifically requested.   Controlled Substance Pharmacotherapy Assessment REMS (Risk Evaluation and Mitigation Strategy)  Opioid Analgesic: None MME/day: 0 mg/day  Pill Count: None expected due to no prior prescriptions written by our practice. No notes on file Pharmacokinetics: Liberation and absorption (onset of action): WNL Distribution (time to peak effect): WNL Metabolism and excretion (duration of action): WNL         Pharmacodynamics: Desired effects: Analgesia: Mr. Babar reports >50% benefit. Functional ability: Patient reports that medication allows him to accomplish basic ADLs Clinically meaningful improvement in function (CMIF): Sustained CMIF goals met Perceived effectiveness: Described as relatively effective, allowing for increase in activities of daily living (ADL) Undesirable effects: Side-effects or Adverse reactions: None reported Monitoring: [place] PMP: PDMP reviewed during this encounter. Online review of the past 42-month period previously conducted. Not applicable at this point since we have not taken over the patient's medication management yet. List of other Serum/Urine Drug Screening Test(s):  No results found for: "AMPHSCRSER", "BARBSCRSER", "BENZOSCRSER", "COCAINSCRSER", "COCAINSCRNUR", "PCPSCRSER", "THCSCRSER", "THCU", "CANNABQUANT", "OPIATESCRSER", "OXYSCRSER", "PROPOXSCRSER", "ETH", "CBDTHCR", "  D8THCCBX", "D9THCCBX" List of all UDS test(s) done:  Lab Results  Component Value Date   SUMMARY Note 07/03/2022   Last UDS on record: Summary  Date Value Ref Range Status  07/03/2022 Note  Final    Comment:     ==================================================================== Compliance Drug Analysis, Ur ==================================================================== Test                             Result       Flag       Units  Drug Present and Declared for Prescription Verification   Gabapentin                     PRESENT      EXPECTED   Sertraline                     PRESENT      EXPECTED   Desmethylsertraline            PRESENT      EXPECTED    Desmethylsertraline is an expected metabolite of sertraline.    Acetaminophen                  PRESENT      EXPECTED  Drug Present not Declared for Prescription Verification   Hydroxybupropion               PRESENT      UNEXPECTED    Hydroxybupropion is an expected metabolite of bupropion.    Ibuprofen                      PRESENT      UNEXPECTED   Diphenhydramine                PRESENT      UNEXPECTED  Drug Absent but Declared for Prescription Verification   Hydrocodone                    Not Detected UNEXPECTED ng/mg creat   Salicylate                     Not Detected UNEXPECTED    Aspirin, as indicated in the declared medication list, is not always    detected even when used as directed.  ==================================================================== Test                      Result    Flag   Units      Ref Range   Creatinine              146              mg/dL      >=40 ==================================================================== Declared Medications:  The flagging and interpretation on this report are based on the  following declared medications.  Unexpected results may arise from  inaccuracies in the declared medications.   **Note: The testing scope of this panel includes these medications:   Gabapentin (Neurontin)  Hydrocodone (Norco)  Sertraline (Zoloft)   **Note: The testing scope of this panel does not include small to  moderate amounts of these reported medications:   Acetaminophen (Tylenol)   Acetaminophen (Norco)  Aspirin   **Note: The testing scope of this panel does not include the  following reported medications:   Amlodipine  Atorvastatin  Carvedilol (Coreg)  Insulin (Lantus)  Lisinopril  Terazosin  Turmeric ====================================================================  For clinical consultation, please call [phone]. ====================================================================    UDS interpretation: No unexpected findings.          Medication Assessment Form: Not applicable. No opioids. Treatment compliance: Not applicable Risk Assessment Profile: Aberrant behavior: See initial evaluations. None observed or detected today Comorbid factors increasing risk of overdose: See initial evaluation. No additional risks detected today Opioid risk tool (ORT):      No data to display          ORT Scoring interpretation table:  Score <3 = Low Risk for SUD  Score between 4-7 = Moderate Risk for SUD  Score >8 = High Risk for Opioid Abuse   Risk of substance use disorder (SUD): Low  Risk Mitigation Strategies:  Patient opioid safety counseling: No controlled substances prescribed. Patient-Prescriber Agreement (PPA): No agreement signed.  Controlled substance notification to other providers: None required. No opioid therapy.  Pharmacologic Plan: Non-opioid analgesic therapy offered. Interventional alternatives discussed.             Laboratory Chemistry Profile   Renal Lab Results  Component Value Date   BUN 22 07/03/2022   CREATININE 2.65 (H) 07/03/2022   BCR 8 (L) 07/03/2022   GFR 68.10 06/08/2010   GFRAA 47 (L) 01/14/2018   GFRNONAA 47 (L) 06/08/2020   PROTEINUR NEGATIVE 05/30/2020     Electrolytes Lab Results  Component Value Date   NA 142 07/03/2022   K 4.2 07/03/2022   CL 105 07/03/2022   CALCIUM 9.1 07/03/2022   MG 2.3 07/03/2022     Hepatic Lab Results  Component Value Date   AST 15 07/03/2022   ALT 47 (H)  05/30/2020   ALBUMIN 4.1 07/03/2022   ALKPHOS 147 (H) 07/03/2022     ID Lab Results  Component Value Date   SARSCOV2NAA NEGATIVE 05/30/2020   STAPHAUREUS NEGATIVE 05/11/2020   MRSAPCR NEGATIVE 05/11/2020     Bone Lab Results  Component Value Date   25OHVITD1 32 07/03/2022   25OHVITD2 <1.0 07/03/2022   25OHVITD3 32 07/03/2022     Endocrine Lab Results  Component Value Date   GLUCOSE 115 (H) 07/03/2022   GLUCOSEU NEGATIVE 05/30/2020   HGBA1C 9.3 (H) 05/11/2020     Neuropathy Lab Results  Component Value Date   VITAMINB12 517 07/03/2022   HGBA1C 9.3 (H) 05/11/2020     CNS No results found for: "COLORCSF", "APPEARCSF", "RBCCOUNTCSF", "WBCCSF", "POLYSCSF", "LYMPHSCSF", "EOSCSF", "PROTEINCSF", "GLUCCSF", "JCVIRUS", "CSFOLI", "IGGCSF", "LABACHR", "ACETBL"   Inflammation (CRP: Acute  ESR: Chronic) Lab Results  Component Value Date   CRP 5 07/03/2022   ESRSEDRATE 47 (H) 07/03/2022   LATICACIDVEN 1.3 05/30/2020     Rheumatology No results found for: "RF", "ANA", "LABURIC", "URICUR", "LYMEIGGIGMAB", "LYMEABIGMQN", "HLAB27"   Coagulation Lab Results  Component Value Date   INR 1.2 05/30/2020   LABPROT 15.5 (H) 05/30/2020   PLT 274 05/30/2020     Cardiovascular Lab Results  Component Value Date   TROPONINI <0.03 11/12/2014   HGB 10.2 (L) 05/30/2020   HCT 31.6 (L) 05/30/2020     Screening Lab Results  Component Value Date   SARSCOV2NAA NEGATIVE 05/30/2020   STAPHAUREUS NEGATIVE 05/11/2020   MRSAPCR NEGATIVE 05/11/2020     Cancer No results found for: "CEA", "CA125", "LABCA2"   Allergens No results found for: "ALMOND", "APPLE", "ASPARAGUS", "AVOCADO", "BANANA", "BARLEY", "BASIL", "BAYLEAF", "GREENBEAN", "LIMABEAN", "WHITEBEAN", "BEEFIGE", "REDBEET", "BLUEBERRY", "BROCCOLI", "CABBAGE", "MELON", "CARROT", "CASEIN", "CASHEWNUT", "CAULIFLOWER", "CELERY"     Note: Lab results reviewed.  Recent Diagnostic  Imaging Review  Cervical Imaging: Cervical MR wo  contrast: Results for orders placed during the hospital encounter of 04/28/16 MR CERVICAL SPINE WO CONTRAST  Narrative [facility] [address] [place], [place] [address] [phone]  NEUROIMAGING REPORT   STUDY DATE: 04/28/2016 PATIENT NAME: RANFERI SALMERON DOB: 1946-01-29 MRN: [id number]  EXAM: MRI of the cervical spine  ORDERING CLINICIAN: Naomie Dean M.D. CLINICAL HISTORY: 77 year old man with right-sided weakness and neck pain COMPARISON FILMS: None  TECHNIQUE: MRI of the cervical spine was obtained utilizing 3 mm sagittal slices from the posterior fossa down to the T3-4 level with T1, T2 and inversion recovery views. In addition 4 mm axial slices from C2-3 down to T1-2 level were included with T2 and gradient echo views. CONTRAST: None IMAGING SITE: [facility], [address] [place], [place], [place]  FINDINGS: :  On sagittal images, the spine is imaged from above the cervicomedullary junction to T2.   There is a small T2 hyperintense focus within the right side of the spinal cord adjacent to the C4-C5 interspace.    There are endplate degenerative changes noted at C3-C4. There is straightening of the cervical curvature.  The discs and interspaces were further evaluated on axial views from C2 to T1 as follows:  C2-C3: There is mild central disc bulging and left facet hypertrophy. The left foramen is mildly narrowed but there does not appear to be any nerve root compression.  C3-C4: There is moderate spinal stenosis due to a central disc protrusion and bilateral uncovertebral spurring. The adjacent spinal cord has normal signal. There is moderate bilateral foraminal narrowing. There is no definite nerve root compression though there is some encroachment upon both of the C4 nerve roots.  C4-C5: There is moderately severe spinal stenosis due to central disc protrusion, uncovertebral spurring and mild right facet hypertrophy. There is a T2 hyperintense  focus within the right side of the spinal cord that could represent a myelopathic score. There is moderately severe right and moderate left foraminal narrowing which could lead to right C5 nerve root compression.  C5-C6: There is moderately severe spinal stenosis due to central disc protrusion, uncovertebral spurring and facet hypertrophy. There is severe right and moderately severe left foraminal narrowing. There is probable right C6 and possible left C6 nerve root compression.  C6-C7: There is mild spinal stenosis due to disc bulging and uncovertebral spurring. There is moderate bilateral foraminal narrowing. There does not appear to be nerve root compression.  C7-T1: The disc and interspace appear normal.  Impression This MRI of the cervical spine shows the following: 1.    There is myelopathic signal within the spinal cord adjacent to the C4-C5 interspace. At this level, there is moderately severe spinal stenosis and moderately severe right foraminal narrowing at could lead to right C5 nerve root compression. 2.    At C5-C6, there is moderately severe spinal stenosis, severe right foraminal narrowing and moderately severe left foraminal narrowing. There is probable right C6 and possible left C6 nerve root compression. 3.   There is also moderate spinal stenosis at C3-C4 and mild spinal stenosis at C6-C7 with less potential for nerve root compression.    INTERPRETING PHYSICIAN: Richard A. Epimenio Foot, MD, PhD Certified in  Neuroimaging by American Society of Neuroimaging  Thoracic Imaging: Thoracic DG w/swimmers view: Results for orders placed during the hospital encounter of 07/03/22 [facility] Thoracic Spine W/Swimmers  Narrative CLINICAL DATA:  Chronic upper lower back pain.  EXAM: THORACIC SPINE - 3  VIEWS  COMPARISON:  None Available.  FINDINGS: No fracture, bone lesion or spondylolisthesis.  Slight curvature, convex to the left in the midthoracic spine and to the right at the  thoracolumbar junction.  Disc spaces are relatively well preserved.  Soft tissues are unremarkable.  IMPRESSION: 1. No fracture or acute finding. No significant degenerative change.   Electronically Signed By: Amie Portland M.D. On: 07/10/2022 13:23  Lumbosacral Imaging: Lumbar MR wo contrast: Results for orders placed during the hospital encounter of 04/28/20 MR LUMBAR SPINE WO CONTRAST  Narrative CLINICAL DATA:  Compression fracture. Recent fall 2 weeks ago. Tingling in both legs.  EXAM: MRI LUMBAR SPINE WITHOUT CONTRAST  TECHNIQUE: Multiplanar, multisequence MR imaging of the lumbar spine was performed. No intravenous contrast was administered.  COMPARISON:  Lumbar spine MRI 12/05/2017  FINDINGS: Segmentation:  Standard.  Alignment:  Physiologic.  Vertebrae: Incomplete burst fracture of L4 with approximately 50% height loss and diffuse bone marrow edema. There may be a small amount of epidural blood ventral to the spinal canal. The lumbar venous plexus is engorged at this level. There is an old compression deformity of L3 with vertebroplasty changes.  Conus medullaris and cauda equina: Conus extends to the L1 level. Conus and cauda equina appear normal.  Paraspinal and other soft tissues: Negative  Disc levels:  L1-L2: Normal disc space and facet joints. No spinal canal stenosis. No neural foraminal stenosis.  L2-L3: Small disc bulge. No spinal canal stenosis. No neural foraminal stenosis.  L3-L4: Disc desiccation with intermediate sized bulge. Mild spinal canal stenosis. Mild bilateral neural foraminal stenosis.  L4-L5: Disc bulging combination with venous plexus engorgement and possible small amount of ventral epidural blood at L4 resulting in severe spinal canal stenosis. Severe bilateral neural foraminal stenosis. There is mass effect on the cauda equina at this level.  L5-S1: Small disc bulge. No spinal canal stenosis. Mild left neural foraminal  stenosis.  Visualized sacrum: Normal.  IMPRESSION: 1. Incomplete burst fracture of L4 with approximately 50% height loss and diffuse bone marrow edema. Possible small amount of ventral epidural blood. 2. 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 Mass effect on the cauda equina. 3. Old L3 compression fracture with vertebroplasty changes.  These results will be called to the ordering clinician or representative by the Radiologist Assistant, and communication documented in the PACS or [employer].   Electronically Signed By: Deatra Robinson M.D. On: 04/29/2020 00:07  Lumbar DG Bending views: Results for orders placed during the hospital encounter of 07/03/22 DG Lumbar Spine Complete W/Bend  Narrative CLINICAL DATA:  Chronic low back pain. History of previous compression fracture and previous fusion.  EXAM: LUMBAR SPINE - COMPLETE WITH BENDING VIEWS  COMPARISON:  10/10/2020.  FINDINGS: No acute fracture. Mild compression fractures of L3, L4 and minimally of L5. Vertebroplasty cement present within the central vertebral bodies of L3, L4 and L5.  Minor curvature, convex the left, apex at L2-L3. No spondylolisthesis.  Posterior fusion is been performed at L4 and L5. There are bilateral pedicle screws and intact interconnecting rods. Screws are well positioned and well-seated.  Minor loss of disc height at L4-L5. Remaining disc spaces are well preserved. Small anterior endplate osteophytes noted throughout the lumbar spine. Mild facet joint narrowing bilaterally at L4-L5 and L5-S1.  No subluxation with flexion or extension. No movement of the orthopedic hardware.  Skeletal structures are demineralized.  Scattered aortic atherosclerotic calcifications.  IMPRESSION: 1. No acute fracture or acute finding. 2. Stable changes  from previous vertebroplasty L3, L4 and L5, as well as L4-L5 posterior fusion. 3. Minor degenerative changes. 4. No  subluxation with flexion or extension.   Electronically Signed By: Amie Portland M.D. On: 07/10/2022 13:27  Complexity Note: Imaging results reviewed.                         Meds   Current Outpatient Medications:    acetaminophen (TYLENOL) 500 MG tablet, Take 1,000 mg by mouth every 6 (six) hours as needed (for pain.)., Disp: , Rfl:    amLODipine (NORVASC) 5 MG tablet, Take 5 mg by mouth daily., Disp: , Rfl:    aspirin EC 81 MG tablet, Take 325 mg by mouth daily. Swallow whole., Disp: , Rfl:    atorvastatin (LIPITOR) 40 MG tablet, Take 40 mg by mouth at bedtime., Disp: , Rfl:    carvedilol (COREG) 25 MG tablet, TAKE 1 TABLET BY MOUTH 2 TIMES DAILY WITH A MEAL. (Patient taking differently: Take 25 mg by mouth 2 (two) times daily with a meal.), Disp: 180 tablet, Rfl: 1   gabapentin (NEURONTIN) 100 MG capsule, Take 100 mg by mouth 3 (three) times daily., Disp: , Rfl:    HYDROcodone-acetaminophen (NORCO/VICODIN) 5-325 MG tablet, Take 1 tablet by mouth every 4 (four) hours as needed for moderate pain., Disp: 30 tablet, Rfl: 0   insulin glargine (LANTUS) 100 UNIT/ML injection, Inject 40 Units into the skin daily., Disp: , Rfl:    lisinopril (ZESTRIL) 40 MG tablet, TAKE 1 TABLET BY MOUTH EVERY DAY (Patient taking differently: Take 40 mg by mouth daily.), Disp: 30 tablet, Rfl: 2   sertraline (ZOLOFT) 100 MG tablet, Take 100 mg by mouth daily., Disp: , Rfl:    terazosin (HYTRIN) 5 MG capsule, TAKE 1 CAPSULE BY MOUTH EVERYDAY AT BEDTIME (Patient taking differently: Take 5 mg by mouth at bedtime.), Disp: 30 capsule, Rfl: 3   Turmeric (QC TUMERIC COMPLEX) 500 MG CAPS, Take 1,200 mg by mouth 4 (four) times daily., Disp: , Rfl:   ROS  Constitutional: Denies any fever or chills Gastrointestinal: No reported hemesis, hematochezia, vomiting, or acute GI distress Musculoskeletal: Denies any acute onset joint swelling, redness, loss of ROM, or weakness Neurological: No reported episodes of acute onset  apraxia, aphasia, dysarthria, agnosia, amnesia, paralysis, loss of coordination, or loss of consciousness  Allergies  Mr. Beatley is allergic to penicillins.  PFSH  Drug: Mr. Kabel  reports no history of drug use. Alcohol:  reports that he does not currently use alcohol. Tobacco:  reports that he quit smoking about 36 years ago. His smoking use included cigarettes. He has a 37.50 pack-year smoking history. He has never used smokeless tobacco. Medical:  has a past medical history of Arrhythmia, Arthritis, Cataract, Chest pain, unspecified, Chronic airway obstruction, not elsewhere classified, CKD (chronic kidney disease), Colon polyps, Depression, Diabetes mellitus, Diverticulosis, Gout, Heart murmur, History of kidney stones, Hyperlipemia, Hypertension, Nephrolithiasis, Normocytic anemia (04/23/2017), Obstructive sleep apnea (adult) (pediatric), Other and unspecified hyperlipidemia, Other left bundle branch block, Retinal vascular occlusion, unspecified, Shortness of breath, Unspecified sleep apnea, Vision loss of right eye, and White coat syndrome with diagnosis of hypertension (09/19/2017). Surgical: Mr. Laplume  has a past surgical history that includes Kidney stone surgery; Cataract extraction (Bilateral); Anterior cervical decomp/discectomy fusion (N/A, 06/26/2016); Anterior cervical decomp/discectomy fusion (N/A, 06/26/2016); Kyphoplasty (N/A, 01/19/2018); Back surgery; Lumbar wound debridement (N/A, 06/05/2020); and Application if wound vac (06/05/2020). Family: family history includes Arthritis/Rheumatoid in his cousin;  Atrial fibrillation in his brother; Diabetes in an other family member; Heart attack (age of onset: 49) in his brother; Heart disease in his father; Parkinson's disease in his brother; Stroke in his father; Uterine cancer in his mother.  Constitutional Exam  General appearance: Well nourished, well developed, and well hydrated. In no apparent acute distress There were no vitals filed for  this visit. BMI Assessment: Estimated body mass index is 28.84 kg/m as calculated from the following:   Height as of 07/03/22: 5\' 10"  (1.778 m).   Weight as of 07/03/22: 201 lb (91.2 kg).  BMI interpretation table: BMI level Category Range association with higher incidence of chronic pain  <18 kg/m2 Underweight   18.5-24.9 kg/m2 Ideal body weight   25-29.9 kg/m2 Overweight Increased incidence by 20%  30-34.9 kg/m2 Obese (Class I) Increased incidence by 68%  35-39.9 kg/m2 Severe obesity (Class II) Increased incidence by 136%  >40 kg/m2 Extreme obesity (Class III) Increased incidence by 254%   Patient's current BMI Ideal Body weight  There is no height or weight on file to calculate BMI. Ideal body weight: 73 kg (160 lb 15 oz) Adjusted ideal body weight: 80.3 kg (176 lb 15.4 oz)   BMI Readings from Last 4 Encounters:  07/03/22 28.84 kg/m  07/24/21 28.84 kg/m  05/15/21 28.96 kg/m  06/19/20 28.53 kg/m   Wt Readings from Last 4 Encounters:  07/03/22 201 lb (91.2 kg)  07/24/21 201 lb (91.2 kg)  05/15/21 201 lb 12.8 oz (91.5 kg)  06/19/20 196 lb (88.9 kg)    Psych/Mental status: Alert, oriented x 3 (person, place, & time)       Eyes: PERLA Respiratory: No evidence of acute respiratory distress  Assessment & Plan  Primary Diagnosis & Pertinent Problem List: The primary encounter diagnosis was Chronic pain syndrome. 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  Visit Diagnosis: 1. Chronic pain syndrome   2. 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 Spinal column pain   4. Compression fracture of L3 lumbar vertebra, sequela   5. Compression fracture of L4 lumbar vertebra,  sequela   6. Abnormal MRI, lumbar spine (04/29/2020)   7. Pharmacologic therapy   8. 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  Procedure Orders    No procedure(s) ordered today   Lab Orders  No laboratory test(s) ordered today   Imaging Orders  No imaging studies ordered today   Referral Orders  No referral(s) requested today    Pharmacological management:  Opioid Analgesics: I will not be prescribing any opioids at this time Membrane stabilizer: I will not be prescribing any at this time Muscle relaxant: I will not be prescribing any at this time NSAID: I will not be prescribing any at this time Other analgesic(s): I will not be prescribing any at this time      Interventional Therapies  Risk Factors  Considerations:     Planned  Pending:      Under consideration:  Pending   Completed:   None at this time   Therapeutic  Palliative (PRN) options:   None established   Completed by other providers:   None reported        Provider-requested follow-up: No follow-ups on file. Recent Visits Date Type Provider Dept  07/03/22 Office Visit Delano Metz, MD [facility]  Showing recent visits within past 90 days and meeting all other requirements Future Appointments Date Type Provider Dept  07/17/22 Appointment Delano Metz, MD [facility]  Showing future appointments within next 90 days and meeting all other requirements   Primary Care Physician: [name], MD  Duration of encounter: *** minutes.  Total time on encounter, as per AMA guidelines included both the face-to-face and non-face-to-face time personally spent by the physician and/or other qualified health  care professional(s) on the day of the encounter (includes time in activities that require the physician or other qualified health care professional and does not include time in activities normally performed by clinical staff). Physician's time may include the following activities when performed: Preparing to see the patient (e.g., pre-charting review of records, searching for previously ordered imaging, lab work, and nerve conduction tests) Review of prior analgesic pharmacotherapies. Reviewing PMP Interpreting ordered tests (e.g., lab work, imaging, nerve conduction tests) Performing post-procedure evaluations, including interpretation of diagnostic procedures Obtaining and/or reviewing separately obtained history Performing a medically appropriate examination and/or evaluation Counseling and educating the patient/family/caregiver Ordering medications, tests, or procedures Referring and communicating with other health care professionals (when not separately reported) Documenting clinical information in the electronic or other health record Independently interpreting results (not separately reported) and communicating results to the patient/ family/caregiver Care coordination (not separately reported)  Note by: Oswaldo Done, MD (TTS technology used. I apologize for any typographical errors that were not detected and corrected.) Date: 07/17/2022; Time: 7:25 AM

## 2022-07-17 ENCOUNTER — Encounter: Payer: Self-pay | Admitting: Pain Medicine

## 2022-07-17 ENCOUNTER — Ambulatory Visit: Payer: HMO | Attending: Pain Medicine | Admitting: Pain Medicine

## 2022-07-17 VITALS — BP 154/76 | HR 64 | Temp 97.9°F | Ht 70.0 in | Wt 204.0 lb

## 2022-07-17 DIAGNOSIS — Z8739 Personal history of other diseases of the musculoskeletal system and connective tissue: Secondary | ICD-10-CM | POA: Diagnosis present

## 2022-07-17 DIAGNOSIS — M5386 Other specified dorsopathies, lumbar region: Secondary | ICD-10-CM | POA: Diagnosis present

## 2022-07-17 DIAGNOSIS — G8929 Other chronic pain: Secondary | ICD-10-CM | POA: Diagnosis present

## 2022-07-17 DIAGNOSIS — Z8781 Personal history of (healed) traumatic fracture: Secondary | ICD-10-CM | POA: Diagnosis present

## 2022-07-17 DIAGNOSIS — Z981 Arthrodesis status: Secondary | ICD-10-CM | POA: Insufficient documentation

## 2022-07-17 DIAGNOSIS — S32040S Wedge compression fracture of fourth lumbar vertebra, sequela: Secondary | ICD-10-CM

## 2022-07-17 DIAGNOSIS — M549 Dorsalgia, unspecified: Secondary | ICD-10-CM | POA: Diagnosis present

## 2022-07-17 DIAGNOSIS — N184 Chronic kidney disease, stage 4 (severe): Secondary | ICD-10-CM | POA: Diagnosis present

## 2022-07-17 DIAGNOSIS — Z9889 Other specified postprocedural states: Secondary | ICD-10-CM | POA: Insufficient documentation

## 2022-07-17 DIAGNOSIS — Z87442 Personal history of urinary calculi: Secondary | ICD-10-CM | POA: Insufficient documentation

## 2022-07-17 DIAGNOSIS — S32030S Wedge compression fracture of third lumbar vertebra, sequela: Secondary | ICD-10-CM | POA: Diagnosis present

## 2022-07-17 DIAGNOSIS — M545 Low back pain, unspecified: Secondary | ICD-10-CM | POA: Diagnosis present

## 2022-07-17 DIAGNOSIS — R937 Abnormal findings on diagnostic imaging of other parts of musculoskeletal system: Secondary | ICD-10-CM | POA: Diagnosis present

## 2022-07-17 DIAGNOSIS — S32050S Wedge compression fracture of fifth lumbar vertebra, sequela: Secondary | ICD-10-CM | POA: Diagnosis present

## 2022-07-17 DIAGNOSIS — Z79899 Other long term (current) drug therapy: Secondary | ICD-10-CM | POA: Insufficient documentation

## 2022-07-17 DIAGNOSIS — M961 Postlaminectomy syndrome, not elsewhere classified: Secondary | ICD-10-CM

## 2022-07-17 DIAGNOSIS — R700 Elevated erythrocyte sedimentation rate: Secondary | ICD-10-CM | POA: Diagnosis present

## 2022-07-17 DIAGNOSIS — Z79891 Long term (current) use of opiate analgesic: Secondary | ICD-10-CM | POA: Insufficient documentation

## 2022-07-17 DIAGNOSIS — M5459 Other low back pain: Secondary | ICD-10-CM | POA: Insufficient documentation

## 2022-07-17 DIAGNOSIS — M47817 Spondylosis without myelopathy or radiculopathy, lumbosacral region: Secondary | ICD-10-CM | POA: Diagnosis present

## 2022-07-17 DIAGNOSIS — G894 Chronic pain syndrome: Secondary | ICD-10-CM | POA: Insufficient documentation

## 2022-07-17 DIAGNOSIS — M5137 Other intervertebral disc degeneration, lumbosacral region: Secondary | ICD-10-CM | POA: Diagnosis present

## 2022-07-17 DIAGNOSIS — R944 Abnormal results of kidney function studies: Secondary | ICD-10-CM | POA: Diagnosis present

## 2022-07-17 NOTE — Patient Instructions (Signed)

## 2022-07-17 NOTE — Progress Notes (Signed)
Safety precautions to be maintained throughout the outpatient stay will include: orient to surroundings, keep bed in low position, maintain call bell within reach at all times, provide assistance with transfer out of bed and ambulation.  

## 2022-07-23 ENCOUNTER — Telehealth: Payer: Self-pay | Admitting: Pain Medicine

## 2022-07-23 NOTE — Telephone Encounter (Signed)
PT daughter called states they spoke with both f patient's doctors and they have clear patient to get MRI done and procedure. PT daughter just wanted to give an FYI. Thanks

## 2022-07-26 ENCOUNTER — Ambulatory Visit
Admission: RE | Admit: 2022-07-26 | Discharge: 2022-07-26 | Disposition: A | Discharge status: Home / Self Care | Payer: HMO | Source: Ambulatory Visit | Attending: Pain Medicine | Admitting: Pain Medicine

## 2022-07-26 DIAGNOSIS — M549 Dorsalgia, unspecified: Secondary | ICD-10-CM

## 2022-07-26 DIAGNOSIS — M5386 Other specified dorsopathies, lumbar region: Secondary | ICD-10-CM | POA: Diagnosis present

## 2022-07-26 DIAGNOSIS — Z9889 Other specified postprocedural states: Secondary | ICD-10-CM

## 2022-07-26 DIAGNOSIS — S32050S Wedge compression fracture of fifth lumbar vertebra, sequela: Secondary | ICD-10-CM

## 2022-07-26 DIAGNOSIS — M5459 Other low back pain: Secondary | ICD-10-CM | POA: Diagnosis present

## 2022-07-26 DIAGNOSIS — M51379 Other intervertebral disc degeneration, lumbosacral region without mention of lumbar back pain or lower extremity pain: Secondary | ICD-10-CM

## 2022-07-26 DIAGNOSIS — G8929 Other chronic pain: Secondary | ICD-10-CM

## 2022-07-26 DIAGNOSIS — Z8781 Personal history of (healed) traumatic fracture: Secondary | ICD-10-CM

## 2022-07-26 DIAGNOSIS — M47817 Spondylosis without myelopathy or radiculopathy, lumbosacral region: Secondary | ICD-10-CM | POA: Diagnosis present

## 2022-07-26 DIAGNOSIS — S32040S Wedge compression fracture of fourth lumbar vertebra, sequela: Secondary | ICD-10-CM

## 2022-07-26 DIAGNOSIS — S32030S Wedge compression fracture of third lumbar vertebra, sequela: Secondary | ICD-10-CM

## 2022-07-26 DIAGNOSIS — Z981 Arthrodesis status: Secondary | ICD-10-CM

## 2022-07-26 DIAGNOSIS — M961 Postlaminectomy syndrome, not elsewhere classified: Secondary | ICD-10-CM | POA: Diagnosis present

## 2022-07-26 DIAGNOSIS — M545 Low back pain, unspecified: Secondary | ICD-10-CM | POA: Insufficient documentation

## 2022-07-26 DIAGNOSIS — M5137 Other intervertebral disc degeneration, lumbosacral region: Secondary | ICD-10-CM | POA: Insufficient documentation

## 2022-07-26 DIAGNOSIS — R937 Abnormal findings on diagnostic imaging of other parts of musculoskeletal system: Secondary | ICD-10-CM | POA: Diagnosis present

## 2022-08-08 ENCOUNTER — Encounter: Payer: Self-pay | Admitting: Pain Medicine

## 2022-08-08 ENCOUNTER — Ambulatory Visit: Payer: HMO | Attending: Pain Medicine | Admitting: Pain Medicine

## 2022-08-08 ENCOUNTER — Ambulatory Visit
Admission: RE | Admit: 2022-08-08 | Discharge: 2022-08-08 | Disposition: A | Discharge status: Home / Self Care | Payer: HMO | Source: Ambulatory Visit | Attending: Pain Medicine | Admitting: Pain Medicine

## 2022-08-08 VITALS — BP 121/77 | HR 67 | Temp 98.1°F | Resp 16 | Ht 70.0 in | Wt 207.0 lb

## 2022-08-08 DIAGNOSIS — R937 Abnormal findings on diagnostic imaging of other parts of musculoskeletal system: Secondary | ICD-10-CM | POA: Diagnosis present

## 2022-08-08 DIAGNOSIS — M47817 Spondylosis without myelopathy or radiculopathy, lumbosacral region: Secondary | ICD-10-CM | POA: Insufficient documentation

## 2022-08-08 DIAGNOSIS — G8929 Other chronic pain: Secondary | ICD-10-CM | POA: Diagnosis present

## 2022-08-08 DIAGNOSIS — M47816 Spondylosis without myelopathy or radiculopathy, lumbar region: Secondary | ICD-10-CM

## 2022-08-08 DIAGNOSIS — M5386 Other specified dorsopathies, lumbar region: Secondary | ICD-10-CM | POA: Diagnosis present

## 2022-08-08 DIAGNOSIS — M545 Low back pain, unspecified: Secondary | ICD-10-CM | POA: Diagnosis present

## 2022-08-08 DIAGNOSIS — M5459 Other low back pain: Secondary | ICD-10-CM | POA: Insufficient documentation

## 2022-08-08 DIAGNOSIS — M5137 Other intervertebral disc degeneration, lumbosacral region: Secondary | ICD-10-CM | POA: Diagnosis present

## 2022-08-08 MED ORDER — MIDAZOLAM HCL 5 MG/5ML IJ SOLN
INTRAMUSCULAR | Status: AC
  Filled 2022-08-08: qty 5

## 2022-08-08 MED ORDER — PENTAFLUOROPROP-TETRAFLUOROETH EX AERO
INHALATION_SPRAY | Freq: Once | CUTANEOUS | Status: AC
  Administered 2022-08-08: 30 via TOPICAL
  Filled 2022-08-08: qty 30

## 2022-08-08 MED ORDER — MIDAZOLAM HCL 5 MG/5ML IJ SOLN
0.5000 mg | Freq: Once | INTRAMUSCULAR | Status: AC
  Administered 2022-08-08: 2 mg via INTRAVENOUS

## 2022-08-08 MED ORDER — ROPIVACAINE HCL 2 MG/ML IJ SOLN
18.0000 mL | Freq: Once | INTRAMUSCULAR | Status: AC
  Administered 2022-08-08: 18 mL via PERINEURAL

## 2022-08-08 MED ORDER — LIDOCAINE HCL 2 % IJ SOLN
20.0000 mL | Freq: Once | INTRAMUSCULAR | Status: AC
  Administered 2022-08-08: 400 mg

## 2022-08-08 MED ORDER — TRIAMCINOLONE ACETONIDE 40 MG/ML IJ SUSP
INTRAMUSCULAR | Status: AC
  Filled 2022-08-08: qty 2

## 2022-08-08 MED ORDER — TRIAMCINOLONE ACETONIDE 40 MG/ML IJ SUSP
80.0000 mg | Freq: Once | INTRAMUSCULAR | Status: AC
  Administered 2022-08-08: 80 mg

## 2022-08-08 MED ORDER — FENTANYL CITRATE (PF) 100 MCG/2ML IJ SOLN: 25.0000 ug | INTRAMUSCULAR | Status: DC | PRN

## 2022-08-08 MED ORDER — LIDOCAINE HCL 2 % IJ SOLN
INTRAMUSCULAR | Status: AC
  Filled 2022-08-08: qty 20

## 2022-08-08 MED ORDER — ROPIVACAINE HCL 2 MG/ML IJ SOLN
INTRAMUSCULAR | Status: AC
  Filled 2022-08-08: qty 20

## 2022-08-08 MED ORDER — LACTATED RINGERS IV SOLN: Freq: Once | INTRAVENOUS | Status: AC

## 2022-08-08 MED ORDER — FENTANYL CITRATE (PF) 100 MCG/2ML IJ SOLN
INTRAMUSCULAR | Status: AC
  Filled 2022-08-08: qty 2

## 2022-08-08 NOTE — Progress Notes (Signed)
PROVIDER NOTE: Interpretation of information contained herein should be left to medically-trained personnel. Specific patient instructions are provided elsewhere under "Patient Instructions" section of medical record. This document was created in part using STT-dictation technology, any transcriptional errors that may result from this process are unintentional.  Patient: Jeffery Fernandez Type: Established DOB: 1945/09/11 MRN: [id number] PCP: [name], MD  Service: Procedure DOS: 08/08/2022 Setting: Ambulatory Location: Ambulatory outpatient facility Delivery: Face-to-face Provider: Oswaldo Done, MD Specialty: Interventional Pain Management Specialty designation: 09 Location: Outpatient facility Ref. Prov.: Delano Metz, MD       Interventional Therapy   Procedure: Lumbar Facet, Medial Branch Block(s) #1  Laterality: Bilateral  Level: L3, L4, L5, and S1 Medial Branch Level(s). Injecting these levels blocks the L4-5 and L5-S1 lumbar facet joints. Imaging: Fluoroscopic guidance         Anesthesia: Local anesthesia (1-2% Lidocaine) Anxiolysis: IV Versed 2.0 mg Sedation: Moderate Sedation None required. No Fentanyl administered.         DOS: 08/08/2022 Performed by: Oswaldo Done, MD  Primary Purpose: Diagnostic/Therapeutic Indications: Low back pain severe enough to impact quality of life or function. 1. Chronic low back pain (1ry area of Pain) (Bilateral) (L>R) w/o sciatica   2. Lumbar facet joint pain   3. 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 Spondylosis without myelopathy or radiculopathy, lumbosacral region   5. DDD (degenerative disc disease), lumbosacral   6. Abnormal MRI, lumbar spine (04/29/2020 & 08/02/2022)    NAS-11 Pain score:   Pre-procedure: 3 /10   Post-procedure: 1 /10     Position / Prep / Materials:  Position: Prone  Prep solution: DuraPrep (Iodine Povacrylex [0.7% available iodine] and Isopropyl Alcohol, 74% w/w) Area Prepped:  Posterolateral Lumbosacral Spine (Wide prep: From the lower border of the scapula down to the end of the tailbone and from flank to flank.)  Materials:  Tray: Block Needle(s):  Type: Spinal  Gauge (G): 22  Length: 3.5-in Qty: 4      H&P (Pre-op Assessment):  Jeffery Fernandez is a 77 y.o. (year old), male patient, seen today for interventional treatment. He  has a past surgical history that includes Kidney stone surgery; Cataract extraction (Bilateral); Anterior cervical decomp/discectomy fusion (N/A, 06/26/2016); Anterior cervical decomp/discectomy fusion (N/A, 06/26/2016); Kyphoplasty (N/A, 01/19/2018); Back surgery; Lumbar wound debridement (N/A, 06/05/2020); and Application if wound vac (06/05/2020). Jeffery Fernandez has a current medication list which includes the following prescription(s): acetaminophen, amlodipine, aspirin ec, atorvastatin, carvedilol, gabapentin, hydrocodone-acetaminophen, insulin glargine, lisinopril, sertraline, terazosin, and turmeric, and the following Facility-Administered Medications: fentanyl and lactated ringers. His primarily concern today is the Back Pain (lower)  Initial Vital Signs:  Pulse/HCG Rate: 67ECG Heart Rate: 66 (NSR) Temp: 98.1 F (36.7 C) Resp: 16 BP: 135/87 SpO2: 96 %  BMI: Estimated body mass index is 29.7 kg/m as calculated from the following:   Height as of this encounter: 5\' 10"  (1.778 m).   Weight as of this encounter: 207 lb (93.9 kg).  Risk Assessment: Allergies: Reviewed. He is allergic to penicillins.  Allergy Precautions: None required Coagulopathies: Reviewed. None identified.  Blood-thinner therapy: None at this time Active Infection(s): Reviewed. None identified. Jeffery Fernandez is afebrile  Site Confirmation: Jeffery Fernandez was asked to confirm the procedure and laterality before marking the site Procedure checklist: Completed Consent: Before the procedure and under the influence of no sedative(s), amnesic(s), or anxiolytics, the patient was informed  of the treatment options, risks and possible complications. To fulfill our ethical and  legal obligations, as recommended by the American Medical Association's Code of Ethics, I have informed the patient of my clinical impression; the nature and purpose of the treatment or procedure; the risks, benefits, and possible complications of the intervention; the alternatives, including doing nothing; the risk(s) and benefit(s) of the alternative treatment(s) or procedure(s); and the risk(s) and benefit(s) of doing nothing. The patient was provided information about the general risks and possible complications associated with the procedure. These may include, but are not limited to: failure to achieve desired goals, infection, bleeding, organ or nerve damage, allergic reactions, paralysis, and death. In addition, the patient was informed of those risks and complications associated to Spine-related procedures, such as failure to decrease pain; infection (i.e.: Meningitis, epidural or intraspinal abscess); bleeding (i.e.: epidural hematoma, subarachnoid hemorrhage, or any other type of intraspinal or peri-dural bleeding); organ or nerve damage (i.e.: Any type of peripheral nerve, nerve root, or spinal cord injury) with subsequent damage to sensory, motor, and/or autonomic systems, resulting in permanent pain, numbness, and/or weakness of one or several areas of the body; allergic reactions; (i.e.: anaphylactic reaction); and/or death. Furthermore, the patient was informed of those risks and complications associated with the medications. These include, but are not limited to: allergic reactions (i.e.: anaphylactic or anaphylactoid reaction(s)); adrenal axis suppression; blood sugar elevation that in diabetics may result in ketoacidosis or comma; water retention that in patients with history of congestive heart failure may result in shortness of breath, pulmonary edema, and decompensation with resultant heart failure; weight  gain; swelling or edema; medication-induced neural toxicity; particulate matter embolism and blood vessel occlusion with resultant organ, and/or nervous system infarction; and/or aseptic necrosis of one or more joints. Finally, the patient was informed that Medicine is not an exact science; therefore, there is also the possibility of unforeseen or unpredictable risks and/or possible complications that may result in a catastrophic outcome. The patient indicated having understood very clearly. We have given the patient no guarantees and we have made no promises. Enough time was given to the patient to ask questions, all of which were answered to the patient's satisfaction. Jeffery Fernandez has indicated that he wanted to continue with the procedure. Attestation: I, the ordering provider, attest that I have discussed with the patient the benefits, risks, side-effects, alternatives, likelihood of achieving goals, and potential problems during recovery for the procedure that I have provided informed consent. Date  Time: 08/08/2022  7:54 AM   Pre-Procedure Preparation:  Monitoring: As per clinic protocol. Respiration, ETCO2, SpO2, BP, heart rate and rhythm monitor placed and checked for adequate function Safety Precautions: Patient was assessed for positional comfort and pressure points before starting the procedure. Time-out: I initiated and conducted the "Time-out" before starting the procedure, as per protocol. The patient was asked to participate by confirming the accuracy of the "Time Out" information. Verification of the correct person, site, and procedure were performed and confirmed by me, the nursing staff, and the patient. "Time-out" conducted as per Joint Commission's Universal Protocol (UP.01.01.01). Time: 0835 Start Time: 0835 hrs.  Description of Procedure:          Laterality: (see above) Targeted Levels: (see above)  Safety Precautions: Aspiration looking for blood return was conducted prior to  all injections. At no point did we inject any substances, as a needle was being advanced. Before injecting, the patient was told to immediately notify me if he was experiencing any new onset of "ringing in the ears, or metallic taste in the mouth". No  attempts were made at seeking any paresthesias. Safe injection practices and needle disposal techniques used. Medications properly checked for expiration dates. SDV (single dose vial) medications used. After the completion of the procedure, all disposable equipment used was discarded in the proper designated medical waste containers. Local Anesthesia: Protocol guidelines were followed. The patient was positioned over the fluoroscopy table. The area was prepped in the usual manner. The time-out was completed. The target area was identified using fluoroscopy. A 12-in long, straight, sterile hemostat was used with fluoroscopic guidance to locate the targets for each level blocked. Once located, the skin was marked with an approved surgical skin marker. Once all sites were marked, the skin (epidermis, dermis, and hypodermis), as well as deeper tissues (fat, connective tissue and muscle) were infiltrated with a small amount of a short-acting local anesthetic, loaded on a 10cc syringe with a 25G, 1.5-in  Needle. An appropriate amount of time was allowed for local anesthetics to take effect before proceeding to the next step. Local Anesthetic: Lidocaine 2.0% The unused portion of the local anesthetic was discarded in the proper designated containers. Technical description of process:   L3 Medial Branch Nerve Block (MBB): The target area for the L3 medial branch is at the junction of the postero-lateral aspect of the superior articular process and the superior, posterior, and medial edge of the transverse process of L4. Under fluoroscopic guidance, a Quincke needle was inserted until contact was made with os over the superior postero-lateral aspect of the pedicular shadow  (target area). After negative aspiration for blood, 0.5 mL of the nerve block solution was injected without difficulty or complication. The needle was removed intact. L4 Medial Branch Nerve Block (MBB): The target area for the L4 medial branch is at the junction of the postero-lateral aspect of the superior articular process and the superior, posterior, and medial edge of the transverse process of L5. Under fluoroscopic guidance, a Quincke needle was inserted until contact was made with os over the superior postero-lateral aspect of the pedicular shadow (target area). After negative aspiration for blood, 0.5 mL of the nerve block solution was injected without difficulty or complication. The needle was removed intact. L5 Medial Branch Nerve Block (MBB): The target area for the L5 medial branch is at the junction of the postero-lateral aspect of the superior articular process and the superior, posterior, and medial edge of the sacral ala. Under fluoroscopic guidance, a Quincke needle was inserted until contact was made with os over the superior postero-lateral aspect of the pedicular shadow (target area). After negative aspiration for blood, 0.5 mL of the nerve block solution was injected without difficulty or complication. The needle was removed intact. S1 Medial Branch Nerve Block (MBB): The target area for the S1 medial branch is at the posterior and inferior 6 o'clock position of the L5-S1 facet joint. Under fluoroscopic guidance, the Quincke needle inserted for the L5 MBB was redirected until contact was made with os over the inferior and postero aspect of the sacrum, at the 6 o' clock position under the L5-S1 facet joint (Target area). After negative aspiration for blood, 0.5 mL of the nerve block solution was injected without difficulty or complication. The needle was removed intact.  Once the entire procedure was completed, the treated area was cleaned, making sure to leave some of the prepping solution  back to take advantage of its long term bactericidal properties.         Illustration of the posterior view of the lumbar spine  and the posterior neural structures. Laminae of L2 through S1 are labeled. DPRL5, dorsal primary ramus of L5; DPRS1, dorsal primary ramus of S1; DPR3, dorsal primary ramus of L3; FJ, facet (zygapophyseal) joint L3-L4; I, inferior articular process of L4; LB1, lateral branch of dorsal primary ramus of L1; IAB, inferior articular branches from L3 medial branch (supplies L4-L5 facet joint); IBP, intermediate branch plexus; MB3, medial branch of dorsal primary ramus of L3; NR3, third lumbar nerve root; S, superior articular process of L5; SAB, superior articular branches from L4 (supplies L4-5 facet joint also); TP3, transverse process of L3.   Facet Joint Innervation (* possible contribution)  L1-2 T12, L1 (L2*)  Medial Branch  L2-3 L1, L2 (L3*)         "          "  L3-4 L2, L3 (L4*)         "          "  L4-5 L3, L4 (L5*)         "          "  L5-S1 L4, L5, S1          "          "    Vitals:   08/08/22 0836 08/08/22 0841 08/08/22 0845 08/08/22 0853  BP: (!) 154/97 (!) 134/103 124/88 121/77  Pulse:      Resp: 16 15 18 16   Temp:      SpO2: 99% 97% 98% 94%  Weight:      Height:         End Time: 0844 hrs.  Imaging Guidance (Spinal):          Type of Imaging Technique: Fluoroscopy Guidance (Spinal) Indication(s): Assistance in needle guidance and placement for procedures requiring needle placement in or near specific anatomical locations not easily accessible without such assistance. Exposure Time: Please see nurses notes. Contrast: None used. Fluoroscopic Guidance: I was personally present during the use of fluoroscopy. "Tunnel Vision Technique" used to obtain the best possible view of the target area. Parallax error corrected before commencing the procedure. "Direction-depth-direction" technique used to introduce the needle under continuous pulsed  fluoroscopy. Once target was reached, antero-posterior, oblique, and lateral fluoroscopic projection used confirm needle placement in all planes. Images permanently stored in EMR. Interpretation: No contrast injected. I personally interpreted the imaging intraoperatively. Adequate needle placement confirmed in multiple planes. Permanent images saved into the patient's record.  Post-operative Assessment:  Post-procedure Vital Signs:  Pulse/HCG Rate: 6768 Temp: 98.1 F (36.7 C) Resp: 16 BP: 121/77 SpO2: 94 %  EBL: None  Complications: No immediate post-treatment complications observed by team, or reported by patient.  Note: The patient tolerated the entire procedure well. A repeat set of vitals were taken after the procedure and the patient was kept under observation following institutional policy, for this type of procedure. Post-procedural neurological assessment was performed, showing return to baseline, prior to discharge. The patient was provided with post-procedure discharge instructions, including a section on how to identify potential problems. Should any problems arise concerning this procedure, the patient was given instructions to immediately contact us, at any time, without hesitation. In any case, we plan to contact the patient by telephone for a follow-up status report regarding this interventional procedure.  Comments:  No additional relevant information.  Plan of Care (POC)  Orders:  Orders Placed This Encounter  Procedures   LUMBAR FACET(MEDIAL BRANCH NERVE BLOCK) MBNB    Scheduling  Instructions:     Procedure: Lumbar facet block (AKA.: Lumbosacral medial branch nerve block)     Side: Bilateral     Level: L3-4, L4-5, L5-S1, and TBD Facets (L2, L3, L4, L5, S1, and TBD Medial Branch Nerves)     Sedation: Patient's choice.     Timeframe: Today    Order Specific Question:   Where will this procedure be performed?    Answer:   [facility]   DG [facility] C-ARM  1-60 MIN NO REPORT    Intraoperative interpretation by procedural physician at [facility].    Standing Status:   Standing    Number of Occurrences:   1    Order Specific Question:   Reason for exam:    Answer:   Assistance in needle guidance and placement for procedures requiring needle placement in or near specific anatomical locations not easily accessible without such assistance.   Informed Consent Details: Physician/Practitioner Attestation; Transcribe to consent form and obtain patient signature    Nursing Order: Transcribe to consent form and obtain patient signature. Note: Always confirm laterality of pain with Jeffery Fernandez, before procedure.    Order Specific Question:   Physician/Practitioner attestation of informed consent for procedure/surgical case    Answer:   I, the physician/practitioner, attest that I have discussed with the patient the benefits, risks, side effects, alternatives, likelihood of achieving goals and potential problems during recovery for the procedure that I have provided informed consent.    Order Specific Question:   Procedure    Answer:   Lumbar Facet Block  under fluoroscopic guidance    Order Specific Question:   Physician/Practitioner performing the procedure    Answer:   Athalie Newhard A. Laban Emperor MD    Order Specific Question:   Indication/Reason    Answer:   Low Back Pain, with our without leg pain, due to Facet Joint Arthralgia (Joint Pain) Spondylosis (Arthritis of the Spine), without myelopathy or radiculopathy (Nerve Damage).   Provide equipment / supplies at bedside    Procedure tray: "Block Tray" (Disposable  single use) Skin infiltration needle: Regular 1.5-in, 25-G, (x1) Block Needle type: Spinal Amount/quantity: 4 Size: Regular (3.5-inch) Gauge: 22G    Standing Status:   Standing    Number of Occurrences:   1    Order Specific Question:   Specify    Answer:   Block Tray   Chronic Opioid Analgesic:  None MME/day: 0 mg/day    Medications ordered for procedure: Meds ordered this encounter  Medications   lidocaine (XYLOCAINE) 2 % (with pres) injection 400 mg   pentafluoroprop-tetrafluoroeth (GEBAUERS) aerosol   lactated ringers infusion   midazolam (VERSED) 5 MG/5ML injection 0.5-2 mg    Make sure Flumazenil is available in the pyxis when using this medication. If oversedation occurs, administer 0.2 mg IV over 15 sec. If after 45 sec no response, administer 0.2 mg again over 1 min; may repeat at 1 min intervals; not to exceed 4 doses (1 mg)   fentaNYL (SUBLIMAZE) injection 25-50 mcg    Make sure Narcan is available in the pyxis when using this medication. In the event of respiratory depression (RR< 8/min): Titrate NARCAN (naloxone) in increments of 0.1 to 0.2 mg IV at 2-3 minute intervals, until desired degree of reversal.   ropivacaine (PF) 2 mg/mL (0.2%) (NAROPIN) injection 18 mL   triamcinolone acetonide (KENALOG-40) injection 80 mg   Medications administered: We administered lidocaine, pentafluoroprop-tetrafluoroeth, lactated ringers, midazolam, ropivacaine (PF) 2 mg/mL (0.2%), and  triamcinolone acetonide.  See the medical record for exact dosing, route, and time of administration.  Follow-up plan:   Return in about 2 weeks (around 08/22/2022) for Proc-day (T,Th), (Face2F), (PPE) & MRI review.       Interventional Therapies  Risk Factors  Considerations:  CAD & LBBB  Hx. 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 Renal Artery occlusion  Hx. Nephrolithiasis  Gout     Planned  Pending:   Diagnostic/therapeutic bilateral lumbar facet MBB #1    Under consideration:   Diagnostic/therapeutic bilateral lumbar facet MBB #1    Completed:   None at this time   Therapeutic  Palliative (PRN) options:   None established   Completed by other providers:   Posterior lumbar fusion L4-L5 (05/11/2020) by Tressie Stalker, MD ([facility] - Neurosurgery)  Lumbar wound debridement (06/05/2020) by Tressie Stalker, MD (NS)  Lumbar kyphoplasty (L3, L4, L5) (01/19/2018) by Tressie Stalker, MD (NS)  Cervical ACDF (06/26/2016) by Tressie Stalker, MD (NS)  Reexploration of cervical wound (06/26/2016) by Donalee Citrin, MD (NS)         Recent Visits Date Type Provider Dept  07/17/22 Office Visit Delano Metz, MD [facility]  07/03/22 Office Visit Delano Metz, MD [facility]  Showing recent visits within past 90 days and meeting all other requirements Today's Visits Date Type Provider Dept  08/08/22 Procedure visit Delano Metz, MD [facility]  Showing today's visits and meeting all other requirements Future Appointments Date Type Provider Dept  08/22/22 Appointment Delano Metz, MD [facility]  Showing future appointments within next 90 days and meeting all other requirements  Disposition: Discharge home  Discharge (Date  Time): 08/08/2022; 0900 hrs.   Primary Care Physician: [name], MD Location: [facility] Note by: Oswaldo Done, MD (TTS technology used. I apologize for any typographical errors that were not detected and corrected.) Date: 08/08/2022; Time: 9:04 AM  Disclaimer:  Medicine is not an Visual merchandiser. The only guarantee in medicine is that nothing is guaranteed. It is important to note that the decision to proceed with this intervention was based on the information collected from the patient. The Data and conclusions were drawn from the patient's questionnaire, the interview, and the physical examination. Because the information was provided in large part by the patient, it cannot be guaranteed that it has not been purposely or unconsciously manipulated. Every effort has been made to obtain as much relevant data as possible for this evaluation. It is important to note that the conclusions that lead to  this procedure are derived in large part from the available data. Always take into account that the treatment will also be dependent on availability of resources and existing treatment guidelines, considered by other Pain Management Practitioners as being common knowledge and practice, at the time of the intervention. For Medico-Legal purposes, it is also important to point out that variation in procedural techniques and pharmacological choices are the acceptable norm. The indications, contraindications, technique, and results of the above procedure should only be interpreted and judged by a Board-Certified Interventional Pain Specialist with extensive familiarity and expertise in the same exact procedure and technique.

## 2022-08-08 NOTE — Patient Instructions (Signed)

## 2022-08-09 ENCOUNTER — Telehealth: Payer: Self-pay | Admitting: *Deleted

## 2022-08-09 NOTE — Telephone Encounter (Signed)
Attempted to call for post procedure follow-up. Message left. 

## 2022-08-21 NOTE — Progress Notes (Unsigned)
PROVIDER NOTE: Information contained herein reflects review and annotations entered in association with encounter. Interpretation of such information and data should be left to medically-trained personnel. Information provided to patient can be located elsewhere in the medical record under "Patient Instructions". Document created using STT-dictation technology, any transcriptional errors that may result from process are unintentional.    Patient: Jeffery Fernandez  Service Category: E/M  Provider: Oswaldo Done, MD  DOB: [birth year]  DOS: 08/22/2022  Referring Provider: [name], MD  MRN: [id number]  Specialty: Interventional Pain Management  PCP: [name], MD  Type: Established Patient  Setting: Ambulatory outpatient    Location: Office  Delivery: Face-to-face     HPI  Mr. Jeffery Fernandez, a 77 y.o. year old male, is here today because of his No primary diagnosis found.. Mr. Jeffery Fernandez primary complain today is No chief complaint on file.  Pertinent problems: Mr. Jeffery Fernandez has Cervical spondylosis with myelopathy and radiculopathy; Spinal stenosis of cervical region; Cervical spondylosis with myelopathy; Spondylitis (HCC); Chronic neck pain; Chronic low back pain (1ry area of Pain) (Bilateral) (L>R) w/o sciatica; Neck pain; History of gout; Bilateral sacroiliitis (HCC); Spinal stenosis of lumbar region; Chronic pain syndrome; Abnormal MRI, lumbar spine (04/29/2020 & 08/02/2022); Compression fracture of L4 lumbar vertebra, sequela; Compression fracture of L3 lumbar vertebra, sequela; History of vertebral fracture (L3, L4, L5); Spinal column pain; Compression fracture of L5 lumbar vertebra, sequela; History of vertebroplasty (L3, L4, L5); History of lumbar spinal fusion (posterior L4-L5); Failed back surgical syndrome; DDD (degenerative disc disease), lumbosacral; Lumbosacral facet arthropathy (L4-5, L5-S1) vs; Lumbar facet joint pain; Spondylosis without myelopathy or radiculopathy, lumbosacral region;  Jeffery Fernandez compression fracture of third lumbar vertebra, sequela; Decreased range of motion of lumbar spine; and History of nephrolithiasis on their pertinent problem list. Pain Assessment: Severity of   is reported as a  /10. Location:    / . Onset:  . Quality:  . Timing:  . Modifying factor(s):  Marland Kitchen Vitals:  vitals were not taken for this visit.  BMI: Estimated body mass index is 29.7 kg/m as calculated from the following:   Height as of 08/08/22: 5\' 10"  (1.778 m).   Weight as of 08/08/22: 207 lb (93.9 kg). Last encounter: 07/17/2022. Last procedure: 08/08/2022.  Reason for encounter: post-procedure evaluation and assessment. ***  Post-procedure evaluation   Procedure: Lumbar Facet, Medial Branch Block(s) #1  Laterality: Bilateral  Level: L3, L4, L5, and S1 Medial Branch Level(s). Injecting these levels blocks the L4-5 and L5-S1 lumbar facet joints. Imaging: Fluoroscopic guidance         Anesthesia: Local anesthesia (1-2% Lidocaine) Anxiolysis: IV Versed 2.0 mg Sedation: Moderate Sedation None required. No Fentanyl administered.         DOS: 08/08/2022 Performed by: Oswaldo Done, MD  Primary Purpose: Diagnostic/Therapeutic Indications: Low back pain severe enough to impact quality of life or function. 1. Chronic low back pain (1ry area of Pain) (Bilateral) (L>R) w/o sciatica   2. Lumbar facet joint pain   3. 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 Spondylosis without myelopathy or radiculopathy, lumbosacral region   5. DDD (degenerative disc disease), lumbosacral   6. Abnormal MRI, lumbar spine (04/29/2020 & 08/02/2022)    NAS-11 Pain score:   Pre-procedure: 3 /10   Post-procedure: 1 /10      Effectiveness:  Initial hour after procedure:   ***. Subsequent 4-6 hours post-procedure:   ***. Analgesia past initial 6 hours:   ***. Ongoing improvement:  Analgesic:  ***  Function:    ***    ROM:    ***     Pharmacotherapy Assessment  Analgesic: None MME/day: 0  mg/day   Monitoring: [place] PMP: PDMP reviewed during this encounter.       Pharmacotherapy: No side-effects or adverse reactions reported. Compliance: No problems identified. Effectiveness: Clinically acceptable.  No notes on file  No results found for: "CBDTHCR" No results found for: "D8THCCBX" No results found for: "D9THCCBX"  UDS:  Summary  Date Value Ref Range Status  07/03/2022 Note  Final    Comment:    ==================================================================== Compliance Drug Analysis, Ur ==================================================================== Test                             Result       Flag       Units  Drug Present and Declared for Prescription Verification   Gabapentin                     PRESENT      EXPECTED   Sertraline                     PRESENT      EXPECTED   Desmethylsertraline            PRESENT      EXPECTED    Desmethylsertraline is an expected metabolite of sertraline.    Acetaminophen                  PRESENT      EXPECTED  Drug Present not Declared for Prescription Verification   Hydroxybupropion               PRESENT      UNEXPECTED    Hydroxybupropion is an expected metabolite of bupropion.    Ibuprofen                      PRESENT      UNEXPECTED   Diphenhydramine                PRESENT      UNEXPECTED  Drug Absent but Declared for Prescription Verification   Hydrocodone                    Not Detected UNEXPECTED ng/mg creat   Salicylate                     Not Detected UNEXPECTED    Aspirin, as indicated in the declared medication list, is not always    detected even when used as directed.  ==================================================================== Test                      Result    Flag   Units      Ref Range   Creatinine              146              mg/dL      >=65 ==================================================================== Declared Medications:  The flagging and interpretation on this report are  based on the  following declared medications.  Unexpected results may arise from  inaccuracies in the declared medications.   **Note: The testing scope of this panel includes these medications:   Gabapentin (Neurontin)  Hydrocodone (Norco)  Sertraline (Zoloft)   **Note: The testing scope of this panel does not  include small to  moderate amounts of these reported medications:   Acetaminophen (Tylenol)  Acetaminophen (Norco)  Aspirin   **Note: The testing scope of this panel does not include the  following reported medications:   Amlodipine  Atorvastatin  Carvedilol (Coreg)  Insulin (Lantus)  Lisinopril  Terazosin  Turmeric ==================================================================== For clinical consultation, please call [phone]. ====================================================================       ROS  Constitutional: Denies any fever or chills Gastrointestinal: No reported hemesis, hematochezia, vomiting, or acute GI distress Musculoskeletal: Denies any acute onset joint swelling, redness, loss of ROM, or weakness Neurological: No reported episodes of acute onset apraxia, aphasia, dysarthria, agnosia, amnesia, paralysis, loss of coordination, or loss of consciousness  Medication Review  HYDROcodone-acetaminophen, Turmeric, acetaminophen, amLODipine, aspirin EC, atorvastatin, carvedilol, gabapentin, insulin glargine, lisinopril, sertraline, and terazosin  History Review  Allergy: Mr. Jeffery Fernandez is allergic to penicillins. Drug: Mr. Jeffery Fernandez  reports no history of drug use. Alcohol:  reports that he does not currently use alcohol. Tobacco:  reports that he quit smoking about 36 years ago. His smoking use included cigarettes. He started smoking about 61 years ago. He has a 37.5 pack-year smoking history. He has never used smokeless tobacco. Social: Mr. Jeffery Fernandez  reports that he quit smoking about 36 years ago. His smoking use included cigarettes. He started  smoking about 61 years ago. He has a 37.5 pack-year smoking history. He has never used smokeless tobacco. He reports that he does not currently use alcohol. He reports that he does not use drugs. Medical:  has a past medical history of Arrhythmia, Arthritis, Cataract, Chest pain, unspecified, Chronic airway obstruction, not elsewhere classified, CKD (chronic kidney disease), Colon polyps, Depression, Diabetes mellitus, Diverticulosis, Gout, Heart murmur, History of kidney stones, Hyperlipemia, Hypertension, Nephrolithiasis, Normocytic anemia (04/23/2017), Obstructive sleep apnea (adult) (pediatric), Other and unspecified hyperlipidemia, Other left bundle branch block, Retinal vascular occlusion, unspecified, Shortness of breath, Unspecified sleep apnea, Vision loss of right eye, and White coat syndrome with diagnosis of hypertension (09/19/2017). Surgical: Mr. Jeffery Fernandez  has a past surgical history that includes Kidney stone surgery; Cataract extraction (Bilateral); Anterior cervical decomp/discectomy fusion (N/A, 06/26/2016); Anterior cervical decomp/discectomy fusion (N/A, 06/26/2016); Kyphoplasty (N/A, 01/19/2018); Back surgery; Lumbar wound debridement (N/A, 06/05/2020); and Application if wound vac (06/05/2020). Family: family history includes Arthritis/Rheumatoid in his cousin; Atrial fibrillation in his brother; Diabetes in an other family member; Heart attack (age of onset: 49) in his brother; Heart disease in his father; Parkinson's disease in his brother; Stroke in his father; Uterine cancer in his mother.  Laboratory Chemistry Profile   Renal Lab Results  Component Value Date   BUN 22 07/03/2022   CREATININE 2.65 (H) 07/03/2022   BCR 8 (L) 07/03/2022   GFR 68.10 06/08/2010   GFRAA 47 (L) 01/14/2018   GFRNONAA 47 (L) 06/08/2020    Hepatic Lab Results  Component Value Date   AST 15 07/03/2022   ALT 47 (H) 05/30/2020   ALBUMIN 4.1 07/03/2022   ALKPHOS 147 (H) 07/03/2022    Electrolytes Lab  Results  Component Value Date   NA 142 07/03/2022   K 4.2 07/03/2022   CL 105 07/03/2022   CALCIUM 9.1 07/03/2022   MG 2.3 07/03/2022    Bone Lab Results  Component Value Date   25OHVITD1 32 07/03/2022   25OHVITD2 <1.0 07/03/2022   25OHVITD3 32 07/03/2022    Inflammation (CRP: Acute Phase) (ESR: Chronic Phase) Lab Results  Component Value Date   CRP 5 07/03/2022   ESRSEDRATE 47 (H)  07/03/2022   LATICACIDVEN 1.3 05/30/2020         Note: Above Lab results reviewed.  Recent Imaging Review  DG PAIN CLINIC C-ARM 1-60 MIN NO REPORT Fluoro was used, but no Radiologist interpretation will be provided.  Please refer to "NOTES" tab for provider progress note. Note: Reviewed        Physical Exam  General appearance: Well nourished, well developed, and well hydrated. In no apparent acute distress Mental status: Alert, oriented x 3 (person, place, & time)       Respiratory: No evidence of acute respiratory distress Eyes: PERLA Vitals: There were no vitals taken for this visit. BMI: Estimated body mass index is 29.7 kg/m as calculated from the following:   Height as of 08/08/22: 5\' 10"  (1.778 m).   Weight as of 08/08/22: 207 lb (93.9 kg). Ideal: Ideal body weight: 73 kg (160 lb 15 oz) Adjusted ideal body weight: 81.4 kg (179 lb 5.8 oz)  Assessment   Diagnosis Status  No diagnosis found. Controlled Controlled Controlled   Updated Problems: No problems updated.  Plan of Care  Problem-specific:  No problem-specific Assessment & Plan notes found for this encounter.  Mr. Jeffery Fernandez has a current medication list which includes the following long-term medication(s): amlodipine, atorvastatin, carvedilol, gabapentin, lisinopril, sertraline, and terazosin.  Pharmacotherapy (Medications Ordered): No orders of the defined types were placed in this encounter.  Orders:  No orders of the defined types were placed in this encounter.  Follow-up plan:   No follow-ups on file.       Interventional Therapies  Risk Factors  Considerations:  CAD & LBBB  Hx. 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 Renal Artery occlusion  Hx. Nephrolithiasis  Gout     Planned  Pending:   Diagnostic/therapeutic bilateral lumbar facet MBB #1    Under consideration:   Diagnostic/therapeutic bilateral lumbar facet MBB #1    Completed:   None at this time   Therapeutic  Palliative (PRN) options:   None established   Completed by other providers:   Posterior lumbar fusion L4-L5 (05/11/2020) by Tressie Stalker, MD ([facility] - Neurosurgery)  Lumbar wound debridement (06/05/2020) by Tressie Stalker, MD (NS)  Lumbar kyphoplasty (L3, L4, L5) (01/19/2018) by Tressie Stalker, MD (NS)  Cervical ACDF (06/26/2016) by Tressie Stalker, MD (NS)  Reexploration of cervical wound (06/26/2016) by Donalee Citrin, MD (NS)          Recent Visits Date Type Provider Dept  08/08/22 Procedure visit Delano Metz, MD [facility]  07/17/22 Office Visit Delano Metz, MD [facility]  07/03/22 Office Visit Delano Metz, MD [facility]  Showing recent visits within past 90 days and meeting all other requirements Future Appointments Date Type Provider Dept  08/22/22 Appointment Delano Metz, MD [facility]  Showing future appointments within next 90 days and meeting all other requirements  I discussed the assessment and treatment plan with the patient. The patient was provided an opportunity to ask questions and all were answered. The patient agreed with the plan and demonstrated an understanding of the instructions.  Patient advised to call back or seek an in-person evaluation if the symptoms or condition worsens.  Duration of encounter: *** minutes.  Total time on encounter, as per AMA guidelines included both the face-to-face and  non-face-to-face time personally spent by the physician and/or other  qualified health care professional(s) on the day of the encounter (includes time in activities that require the physician or other qualified health care professional and does not include time in activities normally performed by clinical staff). Physician's time may include the following activities when performed: Preparing to see the patient (e.g., pre-charting review of records, searching for previously ordered imaging, lab work, and nerve conduction tests) Review of prior analgesic pharmacotherapies. Reviewing PMP Interpreting ordered tests (e.g., lab work, imaging, nerve conduction tests) Performing post-procedure evaluations, including interpretation of diagnostic procedures Obtaining and/or reviewing separately obtained history Performing a medically appropriate examination and/or evaluation Counseling and educating the patient/family/caregiver Ordering medications, tests, or procedures Referring and communicating with other health care professionals (when not separately reported) Documenting clinical information in the electronic or other health record Independently interpreting results (not separately reported) and communicating results to the patient/ family/caregiver Care coordination (not separately reported)  Note by: Oswaldo Done, MD Date: 08/22/2022; Time: 11:12 AM

## 2022-08-22 ENCOUNTER — Ambulatory Visit: Payer: HMO | Attending: Pain Medicine | Admitting: Pain Medicine

## 2022-08-22 ENCOUNTER — Encounter: Payer: Self-pay | Admitting: Pain Medicine

## 2022-08-22 VITALS — BP 149/94 | HR 72 | Temp 98.2°F | Resp 18 | Ht 70.0 in | Wt 207.0 lb

## 2022-08-22 DIAGNOSIS — M5459 Other low back pain: Secondary | ICD-10-CM

## 2022-08-22 DIAGNOSIS — Z967 Presence of other bone and tendon implants: Secondary | ICD-10-CM | POA: Diagnosis present

## 2022-08-22 DIAGNOSIS — G8929 Other chronic pain: Secondary | ICD-10-CM | POA: Insufficient documentation

## 2022-08-22 DIAGNOSIS — M961 Postlaminectomy syndrome, not elsewhere classified: Secondary | ICD-10-CM | POA: Diagnosis present

## 2022-08-22 DIAGNOSIS — M47817 Spondylosis without myelopathy or radiculopathy, lumbosacral region: Secondary | ICD-10-CM | POA: Diagnosis present

## 2022-08-22 DIAGNOSIS — Z09 Encounter for follow-up examination after completed treatment for conditions other than malignant neoplasm: Secondary | ICD-10-CM

## 2022-08-22 DIAGNOSIS — M545 Low back pain, unspecified: Secondary | ICD-10-CM

## 2022-08-22 DIAGNOSIS — Z95828 Presence of other vascular implants and grafts: Secondary | ICD-10-CM | POA: Insufficient documentation

## 2022-08-22 NOTE — Patient Instructions (Signed)

## 2022-08-26 DIAGNOSIS — T85848S Pain due to other internal prosthetic devices, implants and grafts, sequela: Secondary | ICD-10-CM | POA: Insufficient documentation

## 2022-08-26 NOTE — Patient Instructions (Signed)

## 2022-08-26 NOTE — Progress Notes (Signed)
PROVIDER NOTE: Interpretation of information contained herein should be left to medically-trained personnel. Specific patient instructions are provided elsewhere under "Patient Instructions" section of medical record. This document was created in part using STT-dictation technology, any transcriptional errors that may result from this process are unintentional.  Patient: Jeffery Fernandez Type: Established DOB: December 14, 1945 MRN: [id number] PCP: [name], MD  Service: Procedure DOS: 08/27/2022 Setting: Ambulatory Location: Ambulatory outpatient facility Delivery: Face-to-face Provider: Oswaldo Done, MD Specialty: Interventional Pain Management Specialty designation: 09 Location: Outpatient facility Ref. Prov.: [name], MD       Interventional Therapy   Procedure: Lumbar Facet, Medial Branch Block(s) #2  Laterality: Bilateral  Level: L3, L4, L5, and S1 Medial Branch Level(s). Injecting these levels blocks the L4-5 and L5-S1 lumbar facet joints. Imaging: Fluoroscopic guidance         Anesthesia: Local anesthesia (1-2% Lidocaine) Anxiolysis: IV Versed 2.0 mg Sedation: No Sedation None required. No Fentanyl administered.         DOS: 08/27/2022 Performed by: Oswaldo Done, MD  Primary Purpose: Diagnostic/Therapeutic Indications: Low back pain severe enough to impact quality of life or function. 1. Chronic low back pain (1ry area of Pain) (Bilateral) (L>R) w/o sciatica   2. Pain due to implanted hardware, sequela   3. DDD (degenerative disc disease), lumbosacral   4. Lumbar facet joint pain   5. 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 Spinal column pain   7. Compression fracture of L3 lumbar vertebra, sequela   8. Compression fracture of L4 lumbar vertebra, sequela   9. 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 Fusion hardware in spine   12. History of vertebral fracture (L3, L4, L5)   13. History of  vertebroplasty (L3, L4, L5)   14. Spondylosis without myelopathy or radiculopathy, lumbosacral region   15. Wedge compression fracture of third lumbar vertebra, sequela    NAS-11 Pain score:   Pre-procedure: 10-Worst pain ever/10   Post-procedure: 0-No pain/10     Position / Prep / Materials:  Position: Prone  Prep solution: DuraPrep (Iodine Povacrylex [0.7% available iodine] and Isopropyl Alcohol, 74% w/w) Area Prepped: Posterolateral Lumbosacral Spine (Wide prep: From the lower border of the scapula down to the end of the tailbone and from flank to flank.)  Materials:  Tray: Block Needle(s):  Type: Spinal  Gauge (G): 22  Length: 3.5-in Qty: 4      H&P (Pre-op Assessment):  Jeffery Fernandez is a 77 y.o. (year old), male patient, seen today for interventional treatment. He  has a past surgical history that includes Kidney stone surgery; Cataract extraction (Bilateral); Anterior cervical decomp/discectomy fusion (N/A, 06/26/2016); Anterior cervical decomp/discectomy fusion (N/A, 06/26/2016); Kyphoplasty (N/A, 01/19/2018); Back surgery; Lumbar wound debridement (N/A, 06/05/2020); and Application if wound vac (06/05/2020). Jeffery Fernandez has a current medication list which includes the following prescription(s): acetaminophen, amlodipine, aspirin ec, atorvastatin, carvedilol, gabapentin, hydrocodone-acetaminophen, insulin glargine, lisinopril, sertraline, terazosin, and turmeric, and the following Facility-Administered Medications: lactated ringers and midazolam. His primarily concern today is the Back Pain  Initial Vital Signs:  Pulse/HCG Rate: 70ECG Heart Rate: 64 (NSR) Temp: 97.9 F (36.6 C) Resp: 16 BP: (!) 142/75 SpO2: 99 %  BMI: Estimated body mass index is 29.7 kg/m as calculated from the following:   Height as of this encounter: 5\' 10"  (1.778 m).   Weight as of this encounter: 207 lb (93.9 kg).  Risk Assessment: Allergies: Reviewed. He is  allergic to penicillins.  Allergy Precautions:  None required Coagulopathies: Reviewed. None identified.  Blood-thinner therapy: None at this time Active Infection(s): Reviewed. None identified. [name] is afebrile  Site Confirmation: Jeffery Fernandez was asked to confirm the procedure and laterality before marking the site Procedure checklist: Completed Consent: Before the procedure and under the influence of no sedative(s), amnesic(s), or anxiolytics, the patient was informed of the treatment options, risks and possible complications. To fulfill our ethical and legal obligations, as recommended by the American Medical Association's Code of Ethics, I have informed the patient of my clinical impression; the nature and purpose of the treatment or procedure; the risks, benefits, and possible complications of the intervention; the alternatives, including doing nothing; the risk(s) and benefit(s) of the alternative treatment(s) or procedure(s); and the risk(s) and benefit(s) of doing nothing. The patient was provided information about the general risks and possible complications associated with the procedure. These may include, but are not limited to: failure to achieve desired goals, infection, bleeding, organ or nerve damage, allergic reactions, paralysis, and death. In addition, the patient was informed of those risks and complications associated to Spine-related procedures, such as failure to decrease pain; infection (i.e.: Meningitis, epidural or intraspinal abscess); bleeding (i.e.: epidural hematoma, subarachnoid hemorrhage, or any other type of intraspinal or peri-dural bleeding); organ or nerve damage (i.e.: Any type of peripheral nerve, nerve root, or spinal cord injury) with subsequent damage to sensory, motor, and/or autonomic systems, resulting in permanent pain, numbness, and/or weakness of one or several areas of the body; allergic reactions; (i.e.: anaphylactic reaction); and/or death. Furthermore, the patient was informed of those risks and  complications associated with the medications. These include, but are not limited to: allergic reactions (i.e.: anaphylactic or anaphylactoid reaction(s)); adrenal axis suppression; blood sugar elevation that in diabetics may result in ketoacidosis or comma; water retention that in patients with history of congestive heart failure may result in shortness of breath, pulmonary edema, and decompensation with resultant heart failure; weight gain; swelling or edema; medication-induced neural toxicity; particulate matter embolism and blood vessel occlusion with resultant organ, and/or nervous system infarction; and/or aseptic necrosis of one or more joints. Finally, the patient was informed that Medicine is not an exact science; therefore, there is also the possibility of unforeseen or unpredictable risks and/or possible complications that may result in a catastrophic outcome. The patient indicated having understood very clearly. We have given the patient no guarantees and we have made no promises. Enough time was given to the patient to ask questions, all of which were answered to the patient's satisfaction. Mr. Helsley has indicated that he wanted to continue with the procedure. Attestation: I, the ordering provider, attest that I have discussed with the patient the benefits, risks, side-effects, alternatives, likelihood of achieving goals, and potential problems during recovery for the procedure that I have provided informed consent. Date  Time: 08/27/2022  9:24 AM   Pre-Procedure Preparation:  Monitoring: As per clinic protocol. Respiration, ETCO2, SpO2, BP, heart rate and rhythm monitor placed and checked for adequate function Safety Precautions: Patient was assessed for positional comfort and pressure points before starting the procedure. Time-out: I initiated and conducted the "Time-out" before starting the procedure, as per protocol. The patient was asked to participate by confirming the accuracy of the  "Time Out" information. Verification of the correct person, site, and procedure were performed and confirmed by me, the nursing staff, and the patient. "Time-out" conducted as per Joint Commission's Universal Protocol (UP.01.01.01). Time: 1010 Start  Time: 1010 hrs.  Description of Procedure:          Laterality: (see above) Targeted Levels: (see above)  Safety Precautions: Aspiration looking for blood return was conducted prior to all injections. At no point did we inject any substances, as a needle was being advanced. Before injecting, the patient was told to immediately notify me if he was experiencing any new onset of "ringing in the ears, or metallic taste in the mouth". No attempts were made at seeking any paresthesias. Safe injection practices and needle disposal techniques used. Medications properly checked for expiration dates. SDV (single dose vial) medications used. After the completion of the procedure, all disposable equipment used was discarded in the proper designated medical waste containers. Local Anesthesia: Protocol guidelines were followed. The patient was positioned over the fluoroscopy table. The area was prepped in the usual manner. The time-out was completed. The target area was identified using fluoroscopy. A 12-in long, straight, sterile hemostat was used with fluoroscopic guidance to locate the targets for each level blocked. Once located, the skin was marked with an approved surgical skin marker. Once all sites were marked, the skin (epidermis, dermis, and hypodermis), as well as deeper tissues (fat, connective tissue and muscle) were infiltrated with a small amount of a short-acting local anesthetic, loaded on a 10cc syringe with a 25G, 1.5-in  Needle. An appropriate amount of time was allowed for local anesthetics to take effect before proceeding to the next step. Local Anesthetic: Lidocaine 2.0% The unused portion of the local anesthetic was discarded in the proper designated  containers. Technical description of process:   L3 Medial Branch Nerve Block (MBB): The target area for the L3 medial branch is at the junction of the postero-lateral aspect of the superior articular process and the superior, posterior, and medial edge of the transverse process of L4. Under fluoroscopic guidance, a Quincke needle was inserted until contact was made with os over the superior postero-lateral aspect of the pedicular shadow (target area). After negative aspiration for blood, 0.5 mL of the nerve block solution was injected without difficulty or complication. The needle was removed intact. L4 Medial Branch Nerve Block (MBB): The target area for the L4 medial branch is at the junction of the postero-lateral aspect of the superior articular process and the superior, posterior, and medial edge of the transverse process of L5. Under fluoroscopic guidance, a Quincke needle was inserted until contact was made with os over the superior postero-lateral aspect of the pedicular shadow (target area). After negative aspiration for blood, 0.5 mL of the nerve block solution was injected without difficulty or complication. The needle was removed intact. L5 Medial Branch Nerve Block (MBB): The target area for the L5 medial branch is at the junction of the postero-lateral aspect of the superior articular process and the superior, posterior, and medial edge of the sacral ala. Under fluoroscopic guidance, a Quincke needle was inserted until contact was made with os over the superior postero-lateral aspect of the pedicular shadow (target area). After negative aspiration for blood, 0.5 mL of the nerve block solution was injected without difficulty or complication. The needle was removed intact. S1 Medial Branch Nerve Block (MBB): The target area for the S1 medial branch is at the posterior and inferior 6 o'clock position of the L5-S1 facet joint. Under fluoroscopic guidance, the Quincke needle inserted for the L5 MBB  was redirected until contact was made with os over the inferior and postero aspect of the sacrum, at the 6 o'  clock position under the L5-S1 facet joint (Target area). After negative aspiration for blood, 0.5 mL of the nerve block solution was injected without difficulty or complication. The needle was removed intact.  Once the entire procedure was completed, the treated area was cleaned, making sure to leave some of the prepping solution back to take advantage of its long term bactericidal properties.         Illustration of the posterior view of the lumbar spine and the posterior neural structures. Laminae of L2 through S1 are labeled. DPRL5, dorsal primary ramus of L5; DPRS1, dorsal primary ramus of S1; DPR3, dorsal primary ramus of L3; FJ, facet (zygapophyseal) joint L3-L4; I, inferior articular process of L4; LB1, lateral branch of dorsal primary ramus of L1; IAB, inferior articular branches from L3 medial branch (supplies L4-L5 facet joint); IBP, intermediate branch plexus; MB3, medial branch of dorsal primary ramus of L3; NR3, third lumbar nerve root; S, superior articular process of L5; SAB, superior articular branches from L4 (supplies L4-5 facet joint also); TP3, transverse process of L3.   Facet Joint Innervation (* possible contribution)  L1-2 T12, L1 (L2*)  Medial Branch  L2-3 L1, L2 (L3*)         "          "  L3-4 L2, L3 (L4*)         "          "  L4-5 L3, L4 (L5*)         "          "  L5-S1 L4, L5, S1          "          "    Vitals:   08/27/22 0923 08/27/22 1010 08/27/22 1017 08/27/22 1025  BP: (!) 142/75 (!) 147/85  125/89  Pulse: 70     Resp: 16 15 17 16   Temp: 97.9 F (36.6 C)     SpO2: 99% 100% 100% 100%  Weight: 207 lb (93.9 kg)     Height: 5\' 10"  (1.778 m)        End Time: 1017 hrs.  Imaging Guidance (Spinal):          Type of Imaging Technique: Fluoroscopy Guidance (Spinal) Indication(s): Assistance in needle guidance and placement for procedures requiring  needle placement in or near specific anatomical locations not easily accessible without such assistance. Exposure Time: Please see nurses notes. Contrast: None used. Fluoroscopic Guidance: I was personally present during the use of fluoroscopy. "Tunnel Vision Technique" used to obtain the best possible view of the target area. Parallax error corrected before commencing the procedure. "Direction-depth-direction" technique used to introduce the needle under continuous pulsed fluoroscopy. Once target was reached, antero-posterior, oblique, and lateral fluoroscopic projection used confirm needle placement in all planes. Images permanently stored in EMR. Interpretation: No contrast injected. I personally interpreted the imaging intraoperatively. Adequate needle placement confirmed in multiple planes. Permanent images saved into the patient's record.  Post-operative Assessment:  Post-procedure Vital Signs:  Pulse/HCG Rate: 7071 Temp: 97.9 F (36.6 C) Resp: 16 BP: 125/89 SpO2: 100 %  EBL: None  Complications: No immediate post-treatment complications observed by team, or reported by patient.  Note: The patient tolerated the entire procedure well. A repeat set of vitals were taken after the procedure and the patient was kept under observation following institutional policy, for this type of procedure. Post-procedural neurological assessment was performed, showing return to baseline, prior to discharge. The patient  was provided with post-procedure discharge instructions, including a section on how to identify potential problems. Should any problems arise concerning this procedure, the patient was given instructions to immediately contact us, at any time, without hesitation. In any case, we plan to contact the patient by telephone for a follow-up status report regarding this interventional procedure.  Comments:  No additional relevant information.  Plan of Care (POC)  Orders:  Orders Placed This  Encounter  Procedures   LUMBAR FACET(MEDIAL BRANCH NERVE BLOCK) MBNB    Scheduling Instructions:     Procedure: Lumbar facet block (AKA.: Lumbosacral medial branch nerve block)     Side: Bilateral     Level: L4-5, L5-S1, and TBD Facets (L3, L4, L5, S1, and TBD Medial Branch Nerves)     Sedation: Patient's choice.     Timeframe: Today    Order Specific Question:   Where will this procedure be performed?    Answer:   [facility]   DG [facility] C-ARM 1-60 MIN NO REPORT    Intraoperative interpretation by procedural physician at [facility].    Standing Status:   Standing    Number of Occurrences:   1    Order Specific Question:   Reason for exam:    Answer:   Assistance in needle guidance and placement for procedures requiring needle placement in or near specific anatomical locations not easily accessible without such assistance.   Informed Consent Details: Physician/Practitioner Attestation; Transcribe to consent form and obtain patient signature    Nursing Order: Transcribe to consent form and obtain patient signature. Note: Always confirm laterality of pain with Mr. Thoma, before procedure.    Order Specific Question:   Physician/Practitioner attestation of informed consent for procedure/surgical case    Answer:   I, the physician/practitioner, attest that I have discussed with the patient the benefits, risks, side effects, alternatives, likelihood of achieving goals and potential problems during recovery for the procedure that I have provided informed consent.    Order Specific Question:   Procedure    Answer:   Lumbar Facet Block  under fluoroscopic guidance    Order Specific Question:   Physician/Practitioner performing the procedure    Answer:   Rylyn Ranganathan A. Laban Emperor MD    Order Specific Question:   Indication/Reason    Answer:   Low Back Pain, with our without leg pain, due to Facet Joint Arthralgia (Joint Pain) Spondylosis (Arthritis of the Spine), without  myelopathy or radiculopathy (Nerve Damage).   Provide equipment / supplies at bedside    Procedure tray: "Block Tray" (Disposable  single use) Skin infiltration needle: Regular 1.5-in, 25-G, (x1) Block Needle type: Spinal Amount/quantity: 4 Size: Regular (3.5-inch) Gauge: 22G    Standing Status:   Standing    Number of Occurrences:   1    Order Specific Question:   Specify    Answer:   Block Tray   Chronic Opioid Analgesic:  No chronic opioid analgesics therapy prescribed by our practice. None MME/day: 0 mg/day   Medications ordered for procedure: Meds ordered this encounter  Medications   lidocaine (XYLOCAINE) 2 % (with pres) injection 400 mg   pentafluoroprop-tetrafluoroeth (GEBAUERS) aerosol   lactated ringers infusion   midazolam (VERSED) injection 0.5-2 mg    Make sure Flumazenil is available in the pyxis when using this medication. If oversedation occurs, administer 0.2 mg IV over 15 sec. If after 45 sec no response, administer 0.2 mg again over 1 min; may repeat at 1 min  intervals; not to exceed 4 doses (1 mg)   ropivacaine (PF) 2 mg/mL (0.2%) (NAROPIN) injection 18 mL   triamcinolone acetonide (KENALOG-40) injection 80 mg   midazolam (VERSED) 5 MG/5ML injection 0.5-2 mg    Make sure Flumazenil is available in the pyxis when using this medication. If oversedation occurs, administer 0.2 mg IV over 15 sec. If after 45 sec no response, administer 0.2 mg again over 1 min; may repeat at 1 min intervals; not to exceed 4 doses (1 mg)   Medications administered: We administered lidocaine, pentafluoroprop-tetrafluoroeth, lactated ringers, midazolam, ropivacaine (PF) 2 mg/mL (0.2%), and triamcinolone acetonide.  See the medical record for exact dosing, route, and time of administration.  Follow-up plan:   Return in about 2 weeks (around 09/10/2022) for Proc-day (T,Th), (Face2F), (PPE).       Interventional Therapies  Risk Factors  Considerations:  CAD & LBBB  Hx. 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 Renal Artery occlusion  Hx. Nephrolithiasis  Gout        Planned  Pending:      Under consideration:   Therapeutic bilateral lumbar facet RFA #1    Completed:   Diagnostic bilateral (L4-5, L5-S1) lumbar facet (L3-S1) MBB x2 (08/27/2022) (1st:100/90/30 x 3 days)    Therapeutic  Palliative (PRN) options:   None established   Completed by other providers:   Posterior lumbar fusion L4-L5 (05/11/2020) by Tressie Stalker, MD ([facility] - Neurosurgery)  Lumbar wound debridement (06/05/2020) by Tressie Stalker, MD (NS)  Lumbar kyphoplasty (L3, L4, L5) (01/19/2018) by Tressie Stalker, MD (NS)  Cervical ACDF (06/26/2016) by Tressie Stalker, MD (NS)  Reexploration of cervical wound (06/26/2016) by Donalee Citrin, MD (NS)       Recent Visits Date Type Provider Dept  08/22/22 Office Visit Delano Metz, MD [facility]  08/08/22 Procedure visit Delano Metz, MD [facility]  07/17/22 Office Visit Delano Metz, MD [facility]  07/03/22 Office Visit Delano Metz, MD [facility]  Showing recent visits within past 90 days and meeting all other requirements Today's Visits Date Type Provider Dept  08/27/22 Procedure visit Delano Metz, MD [facility]  Showing today's visits and meeting all other requirements Future Appointments Date Type Provider Dept  09/17/22 Appointment Delano Metz, MD [facility]  Showing future appointments within next 90 days and meeting all other requirements  Disposition: Discharge home  Discharge (Date  Time): 08/27/2022; 1030 hrs.   Primary Care Physician: [name], MD Location: [facility] Note by: Oswaldo Done, MD (TTS technology used. I apologize for any typographical errors that were not detected and  corrected.) Date: 08/27/2022; Time: 10:27 AM  Disclaimer:  Medicine is not an Visual merchandiser. The only guarantee in medicine is that nothing is guaranteed. It is important to note that the decision to proceed with this intervention was based on the information collected from the patient. The Data and conclusions were drawn from the patient's questionnaire, the interview, and the physical examination. Because the information was provided in large part by the patient, it cannot be guaranteed that it has not been purposely or unconsciously manipulated. Every effort has been made to obtain as much relevant data as possible for this evaluation. It is important to note that the conclusions that lead to this procedure are derived in large part from the available  data. Always take into account that the treatment will also be dependent on availability of resources and existing treatment guidelines, considered by other Pain Management Practitioners as being common knowledge and practice, at the time of the intervention. For Medico-Legal purposes, it is also important to point out that variation in procedural techniques and pharmacological choices are the acceptable norm. The indications, contraindications, technique, and results of the above procedure should only be interpreted and judged by a Board-Certified Interventional Pain Specialist with extensive familiarity and expertise in the same exact procedure and technique.

## 2022-08-27 ENCOUNTER — Ambulatory Visit
Admission: RE | Admit: 2022-08-27 | Discharge: 2022-08-27 | Disposition: A | Discharge status: Home / Self Care | Payer: HMO | Source: Ambulatory Visit | Attending: Pain Medicine | Admitting: Pain Medicine

## 2022-08-27 ENCOUNTER — Ambulatory Visit: Payer: HMO | Attending: Pain Medicine | Admitting: Pain Medicine

## 2022-08-27 ENCOUNTER — Encounter: Payer: Self-pay | Admitting: Pain Medicine

## 2022-08-27 VITALS — BP 125/89 | HR 70 | Temp 97.9°F | Resp 16 | Ht 70.0 in | Wt 207.0 lb

## 2022-08-27 DIAGNOSIS — M549 Dorsalgia, unspecified: Secondary | ICD-10-CM | POA: Diagnosis present

## 2022-08-27 DIAGNOSIS — M5137 Other intervertebral disc degeneration, lumbosacral region: Secondary | ICD-10-CM | POA: Insufficient documentation

## 2022-08-27 DIAGNOSIS — Z9889 Other specified postprocedural states: Secondary | ICD-10-CM | POA: Insufficient documentation

## 2022-08-27 DIAGNOSIS — S32050S Wedge compression fracture of fifth lumbar vertebra, sequela: Secondary | ICD-10-CM | POA: Diagnosis present

## 2022-08-27 DIAGNOSIS — S32040S Wedge compression fracture of fourth lumbar vertebra, sequela: Secondary | ICD-10-CM | POA: Insufficient documentation

## 2022-08-27 DIAGNOSIS — S32030S Wedge compression fracture of third lumbar vertebra, sequela: Secondary | ICD-10-CM | POA: Insufficient documentation

## 2022-08-27 DIAGNOSIS — G8929 Other chronic pain: Secondary | ICD-10-CM | POA: Insufficient documentation

## 2022-08-27 DIAGNOSIS — M545 Low back pain, unspecified: Secondary | ICD-10-CM | POA: Insufficient documentation

## 2022-08-27 DIAGNOSIS — M47817 Spondylosis without myelopathy or radiculopathy, lumbosacral region: Secondary | ICD-10-CM | POA: Insufficient documentation

## 2022-08-27 DIAGNOSIS — T85848S Pain due to other internal prosthetic devices, implants and grafts, sequela: Secondary | ICD-10-CM | POA: Insufficient documentation

## 2022-08-27 DIAGNOSIS — M47816 Spondylosis without myelopathy or radiculopathy, lumbar region: Secondary | ICD-10-CM

## 2022-08-27 DIAGNOSIS — Z967 Presence of other bone and tendon implants: Secondary | ICD-10-CM | POA: Insufficient documentation

## 2022-08-27 DIAGNOSIS — Z8781 Personal history of (healed) traumatic fracture: Secondary | ICD-10-CM | POA: Diagnosis present

## 2022-08-27 DIAGNOSIS — M5459 Other low back pain: Secondary | ICD-10-CM | POA: Diagnosis present

## 2022-08-27 DIAGNOSIS — M961 Postlaminectomy syndrome, not elsewhere classified: Secondary | ICD-10-CM | POA: Insufficient documentation

## 2022-08-27 MED ORDER — LACTATED RINGERS IV SOLN: Freq: Once | INTRAVENOUS | Status: AC

## 2022-08-27 MED ORDER — TRIAMCINOLONE ACETONIDE 40 MG/ML IJ SUSP
INTRAMUSCULAR | Status: AC
  Filled 2022-08-27: qty 2

## 2022-08-27 MED ORDER — MIDAZOLAM HCL 2 MG/2ML IJ SOLN
0.5000 mg | Freq: Once | INTRAMUSCULAR | Status: AC
  Administered 2022-08-27: 2 mg via INTRAVENOUS

## 2022-08-27 MED ORDER — ROPIVACAINE HCL 2 MG/ML IJ SOLN
INTRAMUSCULAR | Status: AC
  Filled 2022-08-27: qty 20

## 2022-08-27 MED ORDER — ROPIVACAINE HCL 2 MG/ML IJ SOLN
18.0000 mL | Freq: Once | INTRAMUSCULAR | Status: AC
  Administered 2022-08-27: 18 mL via PERINEURAL

## 2022-08-27 MED ORDER — LIDOCAINE HCL 2 % IJ SOLN
INTRAMUSCULAR | Status: AC
  Filled 2022-08-27: qty 20

## 2022-08-27 MED ORDER — LIDOCAINE HCL 2 % IJ SOLN
20.0000 mL | Freq: Once | INTRAMUSCULAR | Status: AC
  Administered 2022-08-27: 400 mg

## 2022-08-27 MED ORDER — TRIAMCINOLONE ACETONIDE 40 MG/ML IJ SUSP
80.0000 mg | Freq: Once | INTRAMUSCULAR | Status: AC
  Administered 2022-08-27: 80 mg

## 2022-08-27 MED ORDER — PENTAFLUOROPROP-TETRAFLUOROETH EX AERO
INHALATION_SPRAY | Freq: Once | CUTANEOUS | Status: AC
  Administered 2022-08-27: 30 via TOPICAL
  Filled 2022-08-27: qty 30

## 2022-08-27 MED ORDER — MIDAZOLAM HCL 5 MG/5ML IJ SOLN: 0.5000 mg | Freq: Once | INTRAMUSCULAR | Status: DC

## 2022-08-27 MED ORDER — MIDAZOLAM HCL 5 MG/5ML IJ SOLN
INTRAMUSCULAR | Status: AC
  Filled 2022-08-27: qty 5

## 2022-08-27 NOTE — Progress Notes (Signed)
Safety precautions to be maintained throughout the outpatient stay will include: orient to surroundings, keep bed in low position, maintain call bell within reach at all times, provide assistance with transfer out of bed and ambulation.  

## 2022-08-28 ENCOUNTER — Telehealth: Payer: Self-pay | Admitting: *Deleted

## 2022-08-28 NOTE — Telephone Encounter (Signed)
Attempted to call for post procedure follow-up. Message left. 

## 2022-09-16 NOTE — Progress Notes (Unsigned)
PROVIDER NOTE: Information contained herein reflects review and annotations entered in association with encounter. Interpretation of such information and data should be left to medically-trained personnel. Information provided to patient can be located elsewhere in the medical record under "Patient Instructions". Document created using STT-dictation technology, any transcriptional errors that may result from process are unintentional.    Patient: Jeffery Fernandez  Service Category: E/M  Provider: Oswaldo Done, MD  DOB: 1945-08-27  DOS: 09/17/2022  Referring Provider: [name], MD  MRN: [id number]  Specialty: Interventional Pain Management  PCP: [name], MD  Type: Established Patient  Setting: Ambulatory outpatient    Location: Office  Delivery: Face-to-face     HPI  Mr. KAIVION GODETTE, a 77 y.o. year old male, is here today because of his Chronic bilateral low back pain without sciatica [M54.50, G89.29]. Mr. Ciaburri primary complain today is No chief complaint on file.  Pertinent problems: Mr. Devall has Cervical spondylosis with myelopathy and radiculopathy; Spinal stenosis of cervical region; Cervical spondylosis with myelopathy; Spondylitis (HCC); Chronic neck pain; Chronic low back pain (1ry area of Pain) (Bilateral) (L>R) w/o sciatica; Neck pain; History of gout; Bilateral sacroiliitis (HCC); Spinal stenosis of lumbar region; Chronic pain syndrome; Abnormal MRI, lumbar spine (04/29/2020 & 08/02/2022); Compression fracture of L4 lumbar vertebra, sequela; Compression fracture of L3 lumbar vertebra, sequela; History of vertebral fracture (L3, L4, L5); Spinal column pain; Compression fracture of L5 lumbar vertebra, sequela; History of vertebroplasty (L3, L4, L5); History of lumbar spinal fusion (posterior L4-L5); Failed back surgical syndrome; DDD (degenerative disc disease), lumbosacral; Lumbosacral facet arthropathy (L4-5, L5-S1) vs; Lumbar facet joint pain; Spondylosis without myelopathy or  radiculopathy, lumbosacral region; Wedge compression fracture of third lumbar vertebra, sequela; Decreased range of motion of lumbar spine; History of nephrolithiasis; Fusion hardware in spine; and Pain due to implanted hardware, sequela on their pertinent problem list. Pain Assessment: Severity of   is reported as a  /10. Location:    / . Onset:  . Quality:  . Timing:  . Modifying factor(s):  Marland Kitchen Vitals:  vitals were not taken for this visit.  BMI: Estimated body mass index is 29.7 kg/m as calculated from the following:   Height as of 08/27/22: 5\' 10"  (1.778 m).   Weight as of 08/27/22: 207 lb (93.9 kg). Last encounter: 08/22/2022. Last procedure: 08/27/2022.  Reason for encounter: post-procedure evaluation and assessment. ***  Post-procedure evaluation   Procedure: Lumbar Facet, Medial Branch Block(s) #2  Laterality: Bilateral  Level: L3, L4, L5, and S1 Medial Branch Level(s). Injecting these levels blocks the L4-5 and L5-S1 lumbar facet joints. Imaging: Fluoroscopic guidance         Anesthesia: Local anesthesia (1-2% Lidocaine) Anxiolysis: IV Versed 2.0 mg Sedation: No Sedation None required. No Fentanyl administered.         DOS: 08/27/2022 Performed by: Oswaldo Done, MD  Primary Purpose: Diagnostic/Therapeutic Indications: Low back pain severe enough to impact quality of life or function. 1. Chronic low back pain (1ry area of Pain) (Bilateral) (L>R) w/o sciatica   2. Pain due to implanted hardware, sequela   3. DDD (degenerative disc disease), lumbosacral   4. Lumbar facet joint pain   5. 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 Spinal column pain   7. Compression fracture of L3 lumbar vertebra, sequela   8. Compression fracture of L4 lumbar vertebra, sequela   9. Compression fracture of L5 lumbar vertebra, sequela   10. Failed back surgical syndrome  11. Fusion hardware in spine   12. History of vertebral fracture (L3, L4, L5)   13. History of vertebroplasty  (L3, L4, L5)   14. Spondylosis without myelopathy or radiculopathy, lumbosacral region   15. Wedge compression fracture of third lumbar vertebra, sequela    NAS-11 Pain score:   Pre-procedure: 10-Worst pain ever/10   Post-procedure: 0-No pain/10      Effectiveness:  Initial hour after procedure:   ***. Subsequent 4-6 hours post-procedure:   ***. Analgesia past initial 6 hours:   ***. Ongoing improvement:  Analgesic:  *** Function:    ***    ROM:    ***     Pharmacotherapy Assessment  Analgesic: No chronic opioid analgesics therapy prescribed by our practice. None MME/day: 0 mg/day   Monitoring: [place] PMP: PDMP reviewed during this encounter.       Pharmacotherapy: No side-effects or adverse reactions reported. Compliance: No problems identified. Effectiveness: Clinically acceptable.  No notes on file  No results found for: "CBDTHCR" No results found for: "D8THCCBX" No results found for: "D9THCCBX"  UDS:  Summary  Date Value Ref Range Status  07/03/2022 Note  Final    Comment:    ==================================================================== Compliance Drug Analysis, Ur ==================================================================== Test                             Result       Flag       Units  Drug Present and Declared for Prescription Verification   Gabapentin                     PRESENT      EXPECTED   Sertraline                     PRESENT      EXPECTED   Desmethylsertraline            PRESENT      EXPECTED    Desmethylsertraline is an expected metabolite of sertraline.    Acetaminophen                  PRESENT      EXPECTED  Drug Present not Declared for Prescription Verification   Hydroxybupropion               PRESENT      UNEXPECTED    Hydroxybupropion is an expected metabolite of bupropion.    Ibuprofen                      PRESENT      UNEXPECTED   Diphenhydramine                PRESENT      UNEXPECTED  Drug Absent but Declared for  Prescription Verification   Hydrocodone                    Not Detected UNEXPECTED ng/mg creat   Salicylate                     Not Detected UNEXPECTED    Aspirin, as indicated in the declared medication list, is not always    detected even when used as directed.  ==================================================================== Test                      Result    Flag   Units  Ref Range   Creatinine              146              mg/dL      >=16 ==================================================================== Declared Medications:  The flagging and interpretation on this report are based on the  following declared medications.  Unexpected results may arise from  inaccuracies in the declared medications.   **Note: The testing scope of this panel includes these medications:   Gabapentin (Neurontin)  Hydrocodone (Norco)  Sertraline (Zoloft)   **Note: The testing scope of this panel does not include small to  moderate amounts of these reported medications:   Acetaminophen (Tylenol)  Acetaminophen (Norco)  Aspirin   **Note: The testing scope of this panel does not include the  following reported medications:   Amlodipine  Atorvastatin  Carvedilol (Coreg)  Insulin (Lantus)  Lisinopril  Terazosin  Turmeric ==================================================================== For clinical consultation, please call [phone]. ====================================================================       ROS  Constitutional: Denies any fever or chills Gastrointestinal: No reported hemesis, hematochezia, vomiting, or acute GI distress Musculoskeletal: Denies any acute onset joint swelling, redness, loss of ROM, or weakness Neurological: No reported episodes of acute onset apraxia, aphasia, dysarthria, agnosia, amnesia, paralysis, loss of coordination, or loss of consciousness  Medication Review  HYDROcodone-acetaminophen, Turmeric, acetaminophen, amLODipine,  aspirin EC, atorvastatin, carvedilol, gabapentin, insulin glargine, lisinopril, sertraline, and terazosin  History Review  Allergy: Mr. Sunshine is allergic to penicillins. Drug: Mr. Yanes  reports no history of drug use. Alcohol:  reports that he does not currently use alcohol. Tobacco:  reports that he quit smoking about 36 years ago. His smoking use included cigarettes. He started smoking about 61 years ago. He has a 37.5 pack-year smoking history. He has never used smokeless tobacco. Social: Mr. Lebovits  reports that he quit smoking about 36 years ago. His smoking use included cigarettes. He started smoking about 61 years ago. He has a 37.5 pack-year smoking history. He has never used smokeless tobacco. He reports that he does not currently use alcohol. He reports that he does not use drugs. Medical:  has a past medical history of Arrhythmia, Arthritis, Cataract, Chest pain, unspecified, Chronic airway obstruction, not elsewhere classified, CKD (chronic kidney disease), Colon polyps, Depression, Diabetes mellitus, Diverticulosis, Gout, Heart murmur, History of kidney stones, Hyperlipemia, Hypertension, Nephrolithiasis, Normocytic anemia (04/23/2017), Obstructive sleep apnea (adult) (pediatric), Other and unspecified hyperlipidemia, Other left bundle branch block, Retinal vascular occlusion, unspecified, Shortness of breath, Unspecified sleep apnea, Vision loss of right eye, and White coat syndrome with diagnosis of hypertension (09/19/2017). Surgical: Mr. Letter  has a past surgical history that includes Kidney stone surgery; Cataract extraction (Bilateral); Anterior cervical decomp/discectomy fusion (N/A, 06/26/2016); Anterior cervical decomp/discectomy fusion (N/A, 06/26/2016); Kyphoplasty (N/A, 01/19/2018); Back surgery; Lumbar wound debridement (N/A, 06/05/2020); and Application if wound vac (06/05/2020). Family: family history includes Arthritis/Rheumatoid in his cousin; Atrial fibrillation in his brother;  Diabetes in an other family member; Heart attack (age of onset: 43) in his brother; Heart disease in his father; Parkinson's disease in his brother; Stroke in his father; Uterine cancer in his mother.  Laboratory Chemistry Profile   Renal Lab Results  Component Value Date   BUN 22 07/03/2022   CREATININE 2.65 (H) 07/03/2022   BCR 8 (L) 07/03/2022   GFR 68.10 06/08/2010   GFRAA 47 (L) 01/14/2018   GFRNONAA 47 (L) 06/08/2020    Hepatic Lab Results  Component Value Date   AST 15 07/03/2022  ALT 47 (H) 05/30/2020   ALBUMIN 4.1 07/03/2022   ALKPHOS 147 (H) 07/03/2022    Electrolytes Lab Results  Component Value Date   NA 142 07/03/2022   K 4.2 07/03/2022   CL 105 07/03/2022   CALCIUM 9.1 07/03/2022   MG 2.3 07/03/2022    Bone Lab Results  Component Value Date   25OHVITD1 32 07/03/2022   25OHVITD2 <1.0 07/03/2022   25OHVITD3 32 07/03/2022    Inflammation (CRP: Acute Phase) (ESR: Chronic Phase) Lab Results  Component Value Date   CRP 5 07/03/2022   ESRSEDRATE 47 (H) 07/03/2022   LATICACIDVEN 1.3 05/30/2020         Note: Above Lab results reviewed.  Recent Imaging Review  DG PAIN CLINIC C-ARM 1-60 MIN NO REPORT Fluoro was used, but no Radiologist interpretation will be provided.  Please refer to "NOTES" tab for provider progress note. Note: Reviewed        Physical Exam  General appearance: Well nourished, well developed, and well hydrated. In no apparent acute distress Mental status: Alert, oriented x 3 (person, place, & time)       Respiratory: No evidence of acute respiratory distress Eyes: PERLA Vitals: There were no vitals taken for this visit. BMI: Estimated body mass index is 29.7 kg/m as calculated from the following:   Height as of 08/27/22: 5\' 10"  (1.778 m).   Weight as of 08/27/22: 207 lb (93.9 kg). Ideal: Patient weight not recorded  Assessment   Diagnosis Status  1. Chronic low back pain (1ry area of Pain) (Bilateral) (L>R) w/o sciatica   2.  Lumbar facet joint pain   3. Pain due to implanted hardware, sequela   4. Spinal column pain   5. Postop check    Controlled Controlled Controlled   Updated Problems: No problems updated.  Plan of Care  Problem-specific:  No problem-specific Assessment & Plan notes found for this encounter.  Mr. JOHNATHAN MOUND has a current medication list which includes the following long-term medication(s): amlodipine, atorvastatin, carvedilol, gabapentin, lisinopril, sertraline, and terazosin.  Pharmacotherapy (Medications Ordered): No orders of the defined types were placed in this encounter.  Orders:  No orders of the defined types were placed in this encounter.  Follow-up plan:   No follow-ups on file.      Interventional Therapies  Risk Factors  Considerations:  CAD & LBBB  Hx. 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 Renal Artery occlusion  Hx. Nephrolithiasis  Gout        Planned  Pending:      Under consideration:   Therapeutic bilateral lumbar facet RFA #1    Completed:   Diagnostic bilateral (L4-5, L5-S1) lumbar facet (L3-S1) MBB x2 (08/27/2022) (1st:100/90/30 x 3 days)    Therapeutic  Palliative (PRN) options:   None established   Completed by other providers:   Posterior lumbar fusion L4-L5 (05/11/2020) by Tressie Stalker, MD ([facility])  Lumbar wound debridement (06/05/2020) by Tressie Stalker, MD ([facility])  Lumbar kyphoplasty (L3, L4, L5) (01/19/2018) by Tressie Stalker, MD (NS)  Cervical ACDF (06/26/2016) by Tressie Stalker, MD (NS)  Reexploration of cervical wound (06/26/2016) by Donalee Citrin, MD ([facility])        Recent Visits Date Type Provider Dept  08/27/22 Procedure visit Delano Metz, MD [facility]  08/22/22 Office Visit Delano Metz, MD [facility]  08/08/22 Procedure  visit Delano Metz, MD [facility]   07/17/22 Office Visit Delano Metz, MD [facility]  07/03/22 Office Visit Delano Metz, MD [facility]  Showing recent visits within past 90 days and meeting all other requirements Future Appointments Date Type Provider Dept  09/17/22 Appointment Delano Metz, MD [facility]  Showing future appointments within next 90 days and meeting all other requirements  I discussed the assessment and treatment plan with the patient. The patient was provided an opportunity to ask questions and all were answered. The patient agreed with the plan and demonstrated an understanding of the instructions.  Patient advised to call back or seek an in-person evaluation if the symptoms or condition worsens.  Duration of encounter: *** minutes.  Total time on encounter, as per AMA guidelines included both the face-to-face and non-face-to-face time personally spent by the physician and/or other qualified health care professional(s) on the day of the encounter (includes time in activities that require the physician or other qualified health care professional and does not include time in activities normally performed by clinical staff). Physician's time may include the following activities when performed: Preparing to see the patient (e.g., pre-charting review of records, searching for previously ordered imaging, lab work, and nerve conduction tests) Review of prior analgesic pharmacotherapies. Reviewing PMP Interpreting ordered tests (e.g., lab work, imaging, nerve conduction tests) Performing post-procedure evaluations, including interpretation of diagnostic procedures Obtaining and/or reviewing separately obtained history Performing a medically appropriate examination and/or evaluation Counseling and educating the patient/family/caregiver Ordering medications, tests, or procedures Referring and communicating with other health care professionals (when not separately  reported) Documenting clinical information in the electronic or other health record Independently interpreting results (not separately reported) and communicating results to the patient/ family/caregiver Care coordination (not separately reported)  Note by: Oswaldo Done, MD Date: 09/17/2022; Time: 5:16 AM

## 2022-09-17 ENCOUNTER — Ambulatory Visit: Payer: HMO | Attending: Pain Medicine | Admitting: Pain Medicine

## 2022-09-17 ENCOUNTER — Encounter: Payer: Self-pay | Admitting: Pain Medicine

## 2022-09-17 VITALS — BP 134/72 | HR 68 | Temp 97.8°F | Resp 16 | Ht 70.0 in | Wt 207.0 lb

## 2022-09-17 DIAGNOSIS — M5459 Other low back pain: Secondary | ICD-10-CM | POA: Diagnosis not present

## 2022-09-17 DIAGNOSIS — T85848S Pain due to other internal prosthetic devices, implants and grafts, sequela: Secondary | ICD-10-CM

## 2022-09-17 DIAGNOSIS — M549 Dorsalgia, unspecified: Secondary | ICD-10-CM | POA: Diagnosis present

## 2022-09-17 DIAGNOSIS — G8929 Other chronic pain: Secondary | ICD-10-CM | POA: Diagnosis present

## 2022-09-17 DIAGNOSIS — Z09 Encounter for follow-up examination after completed treatment for conditions other than malignant neoplasm: Secondary | ICD-10-CM

## 2022-09-17 DIAGNOSIS — M545 Low back pain, unspecified: Secondary | ICD-10-CM

## 2022-09-17 NOTE — Patient Instructions (Signed)
OTC Recommendations: Consider taking over-the-counter supplements such as: Turmeric/curcumin*(anti-inflammatory) Glucosamine/chondroitin (triple strength)*(may help prevent loss of articular cartilage) Vitamin D*(may have the ability to suppress release of chemicals associated with inflammation) Moringa*(anti-inflammatory with mild analgesic effects) (*Always use manufacturer's recommended dosage.)   TENS (Device can be purchased "online", without prescription. Search: "TENS 7000".) Transcutaneous electrical nerve stimulation (TENS) is a method of pain relief involving the use of a mild electrical current. A TENS machine is a small, battery-operated device that has leads connected to sticky pads called electrodes.   Rechargeable 9V batteries:     Electrode placement:   TENS UNIT SAFETY WARNING SHEET and INFORMATION INDICATIONS AND CONTRAINDICTIONS Read the operation manual before using the device. Freight forwarder ([place]) restricts this device to sale by or on the order of a physician. Observe your physician's precise instructions and let him show you where to apply the electrodes. For a successful therapy, the correct application of the electrodes is an important factor. Carefully write down the settings your physician recommended. Indications for use This device is a prescription device and only for symptomatic relief of chronic intractable pain. Contraindications:   Any electrode placement that applies current to the carotid sinus (neck) region.   Patients with implanted electronic devices (for example, a pacemaker) or metallic implants should not undertake.   Any electrode placement that causes current to flow transcerebrally (through the head). The use of unit whenever pain symptoms are undiagnosed, unit etiology is determined.   The use of TENS whenever pain syndromes are undiagnosed, until etiology is established.  WARNINGS AND PRECAUTIONS  Warnings:   The device must be kept out of reach of  children.   The safety of device for use during pregnancy or delivery has not been established.   Do not place electrodes on front of the throat. This may result in spasms of the laryngeal and pharyngeal muscles.   Do not place the electrodes over the carotid nerve (side of neck below ear).   The device is not effective for pain of central origin (headaches).   The device may interfere with electronic monitoring equipment (such as ECG monitors and ECG alarms).   Electrodes should not be placed over the eyes, in the mouth, or internally.   These devices have no curative value.   TENS devices should be used only under the continued supervision of a physician.   TENS is a symptomatic treatment and as such suppresses the sensation of pain which would otherwise serve as a protective mechanism. Precautions/Adverse Reactions   Isolated cases of skin irritation may occur at the site of electrode placement following long-term application.   Stimulation should be stopped and electrodes removed until the cause of the irritation can be determined.   Effectiveness is highly dependent upon patient selection by a person qualified in the management of pain patients.   If the device treatment becomes ineffective or unpleasant, stimulation should be discontinued until reevaluation by a physician/clinician.   Always turn the device off before applying or removing electrodes.   Skin irritation and electrode burns are potential adverse reactions.  PURPOSE: A Transcutaneous Electrical Nerve Stimulator, or TENS, unit is designed to relieve post-operative, acute and chronic pain. It is used for pain caused by peripheral nerves and not central. TENS units are prescription-only devices.  OPERATION: TENS units work in a couple of ways. The first way they are thought to work is by a method called the [employer]. The [employer] states that our brains can only  handle one stimulus at a time. When you have chronic pain, this pain signal is  constantly being sent to your brain and recognized as pain. When an electrical stimulus is added to the area of pain the body feels this electrical stimulus, and since the brain can only handle one thing at a time, the pain is not transmitted to the brain. The second method thought to be part of TENS unit's success is by way of stimulating our own bodies to release their own natural painkillers. TENS units do not work for everyone and results may vary. Always follow the instructions and warnings in your user's manual.  USE: One of the most important tasks that must be performed is battery maintenance. If you are using a Engineering geologist, always fully charge it and fully deplete it before charging it again. These batteries can develop memories and by not performing this charging task correctly, your battery's life can be greatly diminished. If your battery does develop a memory you can help expand the memory by charging for 12 - 13 hours and then completely depleting the battery. Always prepare the skin before applying electrodes. Your skin should be clean and free of any lotions or creams. If you are using electrodes that use conductive gel, apply a small, even layer over the electrode. For carbon, self-adhesive electrodes, apply a drop of water to the electrodes before applying to the skin. The electrodes attach to the lead wires and then the TENS unit. Always grasp the connector and not the cord when inserting or removing. When making adjustments, always make sure the unit's channels (1 and 2) are in the OFF position. The actual settings should be recommended and prescribed by your physician. Medical equipment suppliers don'tset or instruct users as to user settings. When you are using the BURST mode, the unit delivers a series of quick pulses followed by a rest. This cycle repeats itself frequently. Always have channels OFF before changing modes.  For MODULATION mode, the stimulation  automatically varies the width of the pulse.  For CONVENTIONAL mode, the stimulation is constant. After the settings have been fine-tuned, set the timer to 30 or 60 minutes. Your physician should also prescribe the use time. When the lights become dim, it means your batteries should be replaced or recharged.  ACCESSORIES: The electrodes and lead wires can be obtained from your medical equipment supplier. Your medical equipment supplier can set up a recurring delivery to accommodate your needs. Electrodes should be replaced once a month and lead wires once every 6 months.  Video Tutorial https://youtu.be/V_quvXRrlQE?[email or web address]

## 2022-09-17 NOTE — Progress Notes (Unsigned)
Safety precautions to be maintained throughout the outpatient stay will include: orient to surroundings, keep bed in low position, maintain call bell within reach at all times, provide assistance with transfer out of bed and ambulation.  

## 2022-10-22 ENCOUNTER — Other Ambulatory Visit: Payer: Self-pay | Admitting: Nephrology

## 2022-10-22 DIAGNOSIS — D631 Anemia in chronic kidney disease: Secondary | ICD-10-CM

## 2022-10-22 DIAGNOSIS — N184 Chronic kidney disease, stage 4 (severe): Secondary | ICD-10-CM

## 2022-10-25 ENCOUNTER — Ambulatory Visit
Admission: RE | Admit: 2022-10-25 | Discharge: 2022-10-25 | Disposition: A | Discharge status: Home / Self Care | Payer: HMO | Source: Ambulatory Visit | Attending: Nephrology

## 2022-10-25 DIAGNOSIS — N189 Chronic kidney disease, unspecified: Secondary | ICD-10-CM

## 2022-10-25 DIAGNOSIS — N184 Chronic kidney disease, stage 4 (severe): Secondary | ICD-10-CM

## 2022-11-24 ENCOUNTER — Inpatient Hospital Stay (HOSPITAL_COMMUNITY)
Admission: EM | Admit: 2022-11-24 | Discharge: 2022-12-03 | DRG: 291 | Disposition: A | Discharge status: Home Health | Payer: HMO | Attending: Internal Medicine | Admitting: Internal Medicine

## 2022-11-24 ENCOUNTER — Emergency Department (HOSPITAL_COMMUNITY): Payer: HMO

## 2022-11-24 ENCOUNTER — Other Ambulatory Visit: Payer: Self-pay

## 2022-11-24 ENCOUNTER — Encounter (HOSPITAL_COMMUNITY): Payer: Self-pay

## 2022-11-24 DIAGNOSIS — J4489 Other specified chronic obstructive pulmonary disease: Secondary | ICD-10-CM | POA: Diagnosis present

## 2022-11-24 DIAGNOSIS — Z961 Presence of intraocular lens: Secondary | ICD-10-CM | POA: Diagnosis present

## 2022-11-24 DIAGNOSIS — I472 Ventricular tachycardia, unspecified: Secondary | ICD-10-CM | POA: Diagnosis not present

## 2022-11-24 DIAGNOSIS — Z87891 Personal history of nicotine dependence: Secondary | ICD-10-CM

## 2022-11-24 DIAGNOSIS — I951 Orthostatic hypotension: Secondary | ICD-10-CM | POA: Diagnosis not present

## 2022-11-24 DIAGNOSIS — Z79899 Other long term (current) drug therapy: Secondary | ICD-10-CM

## 2022-11-24 DIAGNOSIS — R911 Solitary pulmonary nodule: Secondary | ICD-10-CM

## 2022-11-24 DIAGNOSIS — Z833 Family history of diabetes mellitus: Secondary | ICD-10-CM

## 2022-11-24 DIAGNOSIS — M545 Low back pain, unspecified: Secondary | ICD-10-CM | POA: Diagnosis present

## 2022-11-24 DIAGNOSIS — R441 Visual hallucinations: Secondary | ICD-10-CM | POA: Diagnosis present

## 2022-11-24 DIAGNOSIS — Z9841 Cataract extraction status, right eye: Secondary | ICD-10-CM

## 2022-11-24 DIAGNOSIS — E1122 Type 2 diabetes mellitus with diabetic chronic kidney disease: Secondary | ICD-10-CM | POA: Diagnosis present

## 2022-11-24 DIAGNOSIS — J679 Hypersensitivity pneumonitis due to unspecified organic dust: Secondary | ICD-10-CM | POA: Diagnosis present

## 2022-11-24 DIAGNOSIS — Z860100 Personal history of colon polyps, unspecified: Secondary | ICD-10-CM

## 2022-11-24 DIAGNOSIS — Z8049 Family history of malignant neoplasm of other genital organs: Secondary | ICD-10-CM

## 2022-11-24 DIAGNOSIS — Z8261 Family history of arthritis: Secondary | ICD-10-CM

## 2022-11-24 DIAGNOSIS — Z823 Family history of stroke: Secondary | ICD-10-CM

## 2022-11-24 DIAGNOSIS — D631 Anemia in chronic kidney disease: Secondary | ICD-10-CM | POA: Diagnosis present

## 2022-11-24 DIAGNOSIS — Z66 Do not resuscitate: Secondary | ICD-10-CM | POA: Diagnosis present

## 2022-11-24 DIAGNOSIS — F32A Depression, unspecified: Secondary | ICD-10-CM | POA: Diagnosis present

## 2022-11-24 DIAGNOSIS — H5461 Unqualified visual loss, right eye, normal vision left eye: Secondary | ICD-10-CM | POA: Diagnosis present

## 2022-11-24 DIAGNOSIS — N400 Enlarged prostate without lower urinary tract symptoms: Secondary | ICD-10-CM | POA: Diagnosis present

## 2022-11-24 DIAGNOSIS — Z820 Family history of epilepsy and other diseases of the nervous system: Secondary | ICD-10-CM

## 2022-11-24 DIAGNOSIS — R531 Weakness: Secondary | ICD-10-CM | POA: Diagnosis present

## 2022-11-24 DIAGNOSIS — I130 Hypertensive heart and chronic kidney disease with heart failure and stage 1 through stage 4 chronic kidney disease, or unspecified chronic kidney disease: Principal | ICD-10-CM | POA: Diagnosis present

## 2022-11-24 DIAGNOSIS — Z801 Family history of malignant neoplasm of trachea, bronchus and lung: Secondary | ICD-10-CM

## 2022-11-24 DIAGNOSIS — Z888 Allergy status to other drugs, medicaments and biological substances status: Secondary | ICD-10-CM

## 2022-11-24 DIAGNOSIS — R0902 Hypoxemia: Principal | ICD-10-CM

## 2022-11-24 DIAGNOSIS — N179 Acute kidney failure, unspecified: Secondary | ICD-10-CM

## 2022-11-24 DIAGNOSIS — Z8249 Family history of ischemic heart disease and other diseases of the circulatory system: Secondary | ICD-10-CM

## 2022-11-24 DIAGNOSIS — G4733 Obstructive sleep apnea (adult) (pediatric): Secondary | ICD-10-CM | POA: Diagnosis present

## 2022-11-24 DIAGNOSIS — I5031 Acute diastolic (congestive) heart failure: Secondary | ICD-10-CM | POA: Diagnosis present

## 2022-11-24 DIAGNOSIS — N184 Chronic kidney disease, stage 4 (severe): Secondary | ICD-10-CM | POA: Diagnosis present

## 2022-11-24 DIAGNOSIS — I447 Left bundle-branch block, unspecified: Secondary | ICD-10-CM | POA: Diagnosis present

## 2022-11-24 DIAGNOSIS — Z794 Long term (current) use of insulin: Secondary | ICD-10-CM

## 2022-11-24 DIAGNOSIS — I5032 Chronic diastolic (congestive) heart failure: Secondary | ICD-10-CM

## 2022-11-24 DIAGNOSIS — G8929 Other chronic pain: Secondary | ICD-10-CM | POA: Diagnosis present

## 2022-11-24 DIAGNOSIS — I2510 Atherosclerotic heart disease of native coronary artery without angina pectoris: Secondary | ICD-10-CM | POA: Diagnosis present

## 2022-11-24 DIAGNOSIS — Z91199 Patient's noncompliance with other medical treatment and regimen due to unspecified reason: Secondary | ICD-10-CM

## 2022-11-24 DIAGNOSIS — Z7984 Long term (current) use of oral hypoglycemic drugs: Secondary | ICD-10-CM

## 2022-11-24 DIAGNOSIS — J9601 Acute respiratory failure with hypoxia: Secondary | ICD-10-CM | POA: Diagnosis present

## 2022-11-24 DIAGNOSIS — Z87442 Personal history of urinary calculi: Secondary | ICD-10-CM

## 2022-11-24 DIAGNOSIS — E785 Hyperlipidemia, unspecified: Secondary | ICD-10-CM | POA: Diagnosis present

## 2022-11-24 DIAGNOSIS — Z9842 Cataract extraction status, left eye: Secondary | ICD-10-CM

## 2022-11-24 DIAGNOSIS — Z1152 Encounter for screening for COVID-19: Secondary | ICD-10-CM

## 2022-11-24 DIAGNOSIS — Z7982 Long term (current) use of aspirin: Secondary | ICD-10-CM

## 2022-11-24 DIAGNOSIS — J9811 Atelectasis: Secondary | ICD-10-CM | POA: Diagnosis present

## 2022-11-24 DIAGNOSIS — J84112 Idiopathic pulmonary fibrosis: Secondary | ICD-10-CM | POA: Diagnosis present

## 2022-11-24 DIAGNOSIS — Z515 Encounter for palliative care: Secondary | ICD-10-CM

## 2022-11-24 LAB — BLOOD GAS, VENOUS
Acid-base deficit: 1.8 mmol/L (ref 0.0–2.0)
Bicarbonate: 23.1 mmol/L (ref 20.0–28.0)
O2 Saturation: 75.3 %
Patient temperature: 37
pCO2, Ven: 39 mm[Hg] — ABNORMAL LOW (ref 44–60)
pH, Ven: 7.38 (ref 7.25–7.43)
pO2, Ven: 47 mm[Hg] — ABNORMAL HIGH (ref 32–45)

## 2022-11-24 LAB — CBC
HCT: 32.4 % — ABNORMAL LOW (ref 39.0–52.0)
Hemoglobin: 10.5 g/dL — ABNORMAL LOW (ref 13.0–17.0)
MCH: 29.7 pg (ref 26.0–34.0)
MCHC: 32.4 g/dL (ref 30.0–36.0)
MCV: 91.8 fL (ref 80.0–100.0)
Platelets: 246 10*3/uL (ref 150–400)
RBC: 3.53 MIL/uL — ABNORMAL LOW (ref 4.22–5.81)
RDW: 14.2 % (ref 11.5–15.5)
WBC: 9.6 10*3/uL (ref 4.0–10.5)
nRBC: 0 % (ref 0.0–0.2)

## 2022-11-24 LAB — CBC WITH DIFFERENTIAL/PLATELET
Abs Immature Granulocytes: 0.04 10*3/uL (ref 0.00–0.07)
Basophils Absolute: 0.1 10*3/uL (ref 0.0–0.1)
Basophils Relative: 1 %
Eosinophils Absolute: 0.2 10*3/uL (ref 0.0–0.5)
Eosinophils Relative: 2 %
HCT: 31.5 % — ABNORMAL LOW (ref 39.0–52.0)
Hemoglobin: 10.4 g/dL — ABNORMAL LOW (ref 13.0–17.0)
Immature Granulocytes: 0 %
Lymphocytes Relative: 17 %
Lymphs Abs: 1.7 10*3/uL (ref 0.7–4.0)
MCH: 30.9 pg (ref 26.0–34.0)
MCHC: 33 g/dL (ref 30.0–36.0)
MCV: 93.5 fL (ref 80.0–100.0)
Monocytes Absolute: 0.6 10*3/uL (ref 0.1–1.0)
Monocytes Relative: 6 %
Neutro Abs: 7.2 10*3/uL (ref 1.7–7.7)
Neutrophils Relative %: 74 %
Platelets: 237 10*3/uL (ref 150–400)
RBC: 3.37 MIL/uL — ABNORMAL LOW (ref 4.22–5.81)
RDW: 14.4 % (ref 11.5–15.5)
WBC: 9.8 10*3/uL (ref 4.0–10.5)
nRBC: 0 % (ref 0.0–0.2)

## 2022-11-24 LAB — TSH: TSH: 3.366 u[IU]/mL (ref 0.350–4.500)

## 2022-11-24 LAB — RESP PANEL BY RT-PCR (RSV, FLU A&B, COVID)  RVPGX2
Influenza A by PCR: NEGATIVE
Influenza B by PCR: NEGATIVE
Resp Syncytial Virus by PCR: NEGATIVE
SARS Coronavirus 2 by RT PCR: NEGATIVE

## 2022-11-24 LAB — BASIC METABOLIC PANEL
Anion gap: 11 (ref 5–15)
BUN: 24 mg/dL — ABNORMAL HIGH (ref 8–23)
CO2: 23 mmol/L (ref 22–32)
Calcium: 8.4 mg/dL — ABNORMAL LOW (ref 8.9–10.3)
Chloride: 105 mmol/L (ref 98–111)
Creatinine, Ser: 3.02 mg/dL — ABNORMAL HIGH (ref 0.61–1.24)
GFR, Estimated: 20 mL/min — ABNORMAL LOW (ref >60)
Glucose, Bld: 106 mg/dL — ABNORMAL HIGH (ref 70–99)
Potassium: 4.1 mmol/L (ref 3.5–5.1)
Sodium: 139 mmol/L (ref 135–145)

## 2022-11-24 LAB — MRSA NEXT GEN BY PCR, NASAL: MRSA by PCR Next Gen: NOT DETECTED

## 2022-11-24 LAB — BRAIN NATRIURETIC PEPTIDE: B Natriuretic Peptide: 125.5 pg/mL — ABNORMAL HIGH (ref 0.0–100.0)

## 2022-11-24 LAB — HEMOGLOBIN A1C
Hgb A1c MFr Bld: 5.6 % (ref 4.8–5.6)
Mean Plasma Glucose: 114.02 mg/dL

## 2022-11-24 LAB — CREATININE, SERUM
Creatinine, Ser: 3.01 mg/dL — ABNORMAL HIGH (ref 0.61–1.24)
GFR, Estimated: 20 mL/min — ABNORMAL LOW (ref >60)

## 2022-11-24 MED ORDER — ENOXAPARIN SODIUM 30 MG/0.3ML IJ SOSY
30.0000 mg | PREFILLED_SYRINGE | INTRAMUSCULAR | Status: DC
  Administered 2022-11-25 – 2022-11-30 (×6): 30 mg via SUBCUTANEOUS
  Filled 2022-11-24 (×6): qty 0.3

## 2022-11-24 MED ORDER — LISINOPRIL 20 MG PO TABS: 40.0000 mg | ORAL_TABLET | Freq: Every day | ORAL | Status: DC

## 2022-11-24 MED ORDER — IPRATROPIUM-ALBUTEROL 0.5-2.5 (3) MG/3ML IN SOLN
3.0000 mL | Freq: Once | RESPIRATORY_TRACT | Status: AC
  Administered 2022-11-24: 3 mL via RESPIRATORY_TRACT
  Filled 2022-11-24: qty 3

## 2022-11-24 MED ORDER — ATORVASTATIN CALCIUM 40 MG PO TABS
40.0000 mg | ORAL_TABLET | Freq: Every day | ORAL | Status: DC
  Administered 2022-11-24 – 2022-12-02 (×9): 40 mg via ORAL
  Filled 2022-11-24 (×9): qty 1

## 2022-11-24 MED ORDER — TERAZOSIN HCL 5 MG PO CAPS
5.0000 mg | ORAL_CAPSULE | Freq: Every day | ORAL | Status: DC
  Administered 2022-11-25 – 2022-11-26 (×2): 5 mg via ORAL
  Filled 2022-11-24 (×3): qty 1

## 2022-11-24 MED ORDER — AMLODIPINE BESYLATE 5 MG PO TABS
5.0000 mg | ORAL_TABLET | Freq: Every day | ORAL | Status: DC
  Administered 2022-11-25 – 2022-11-26 (×2): 5 mg via ORAL
  Filled 2022-11-24 (×2): qty 1

## 2022-11-24 MED ORDER — FUROSEMIDE 10 MG/ML IJ SOLN
40.0000 mg | Freq: Once | INTRAMUSCULAR | Status: AC
  Administered 2022-11-24: 40 mg via INTRAVENOUS
  Filled 2022-11-24: qty 4

## 2022-11-24 MED ORDER — SERTRALINE HCL 100 MG PO TABS
100.0000 mg | ORAL_TABLET | Freq: Every day | ORAL | Status: DC
  Administered 2022-11-25 – 2022-12-03 (×9): 100 mg via ORAL
  Filled 2022-11-24 (×9): qty 1

## 2022-11-24 MED ORDER — GLUCERNA SHAKE PO LIQD
237.0000 mL | Freq: Three times a day (TID) | ORAL | Status: DC
  Administered 2022-11-24 – 2022-12-03 (×19): 237 mL via ORAL

## 2022-11-24 MED ORDER — ACETAMINOPHEN 500 MG PO TABS: 1000.0000 mg | ORAL_TABLET | Freq: Four times a day (QID) | ORAL | Status: DC | PRN

## 2022-11-24 MED ORDER — GABAPENTIN 100 MG PO CAPS
100.0000 mg | ORAL_CAPSULE | Freq: Three times a day (TID) | ORAL | Status: DC
  Administered 2022-11-24 – 2022-12-03 (×26): 100 mg via ORAL
  Filled 2022-11-24 (×26): qty 1

## 2022-11-24 NOTE — ED Triage Notes (Signed)
Patient BIB EMS today from home. Patient called out with the complaint of shortness of breath. He states that for the past week he has been having issues with new shortness of breath. Patient is also having weakness, SOB with moving and decreased appetite.  Lungs clear. No oxygen at baseline.   EMS had him on 4L nasal cannula.   150/72 CBG 197 71 HR

## 2022-11-24 NOTE — ED Provider Notes (Signed)
[place] EMERGENCY DEPARTMENT AT [facility] Provider Note   CSN: [id number] Arrival date & time: 11/24/22  1235     History  Chief Complaint  Patient presents with   Shortness of Breath    Jeffery Fernandez is a 77 y.o. male.  77 year old male with past medical history of diabetes, hypertension, and greater than 20-pack-year smoking history presenting to the emergency department today with shortness of breath.  The patient has been having cough and shortness of breath over the past few days.  The patient has been afebrile.  He denies any associated chest pain.  Denies any significant leg swelling compared to his baseline.  He was apparently complaining of shortness of breath this morning and his family members checked his oxygen saturations which were low.  He was subsequently sent to the emergency department for further evaluation.  He does report a remote smoking history but denies an official diagnosis of COPD or asthma.  He has had a cough that has been mostly nonproductive.  Denies any hemoptysis.   Shortness of Breath Associated symptoms: cough        Home Medications Prior to Admission medications   Medication Sig Start Date End Date Taking? Authorizing Provider  acetaminophen (TYLENOL) 500 MG tablet Take 1,000 mg by mouth every 6 (six) hours as needed (for pain.).    [provider]  amLODipine (NORVASC) 5 MG tablet Take 5 mg by mouth daily.    [provider]  aspirin EC 81 MG tablet Take 325 mg by mouth daily. Swallow whole.    [provider]  atorvastatin (LIPITOR) 40 MG tablet Take 40 mg by mouth at bedtime.    [provider]  carvedilol (COREG) 25 MG tablet TAKE 1 TABLET BY MOUTH 2 TIMES DAILY WITH A MEAL. Patient taking differently: Take 25 mg by mouth 2 (two) times daily with a meal. 12/02/18   Chilton Si, MD  gabapentin (NEURONTIN) 100 MG capsule Take 100 mg by mouth 3 (three) times daily. 100mg  in the morning  and evening. 200mg  at night    [provider]  insulin glargine (LANTUS) 100 UNIT/ML injection Inject 40 Units into the skin daily.    [provider]  lisinopril (ZESTRIL) 40 MG tablet TAKE 1 TABLET BY MOUTH EVERY DAY Patient taking differently: Take 40 mg by mouth daily. 10/01/19   Chilton Si, MD  sertraline (ZOLOFT) 100 MG tablet Take 100 mg by mouth daily.    [provider]  terazosin (HYTRIN) 5 MG capsule TAKE 1 CAPSULE BY MOUTH EVERYDAY AT BEDTIME Patient taking differently: Take 5 mg by mouth at bedtime. 06/26/18   Chilton Si, MD  Turmeric (QC TUMERIC COMPLEX) 500 MG CAPS Take 1,200 mg by mouth 4 (four) times daily.    [provider]      Allergies    Penicillins    Review of Systems   Review of Systems  Respiratory:  Positive for cough and shortness of breath. Negative for stridor.   All other systems reviewed and are negative.   Physical Exam Updated Vital Signs BP 136/66 (BP Location: Left Arm)   Pulse 80   Temp 98.1 F (36.7 C) (Oral)   Resp 17   Ht 5\' 9"  (1.753 m)   Wt 88.3 kg   SpO2 91%   BMI 28.75 kg/m  Physical Exam Vitals and nursing note reviewed.   Gen: NAD Eyes: PERRL, EOMI HEENT: no oropharyngeal swelling Neck: trachea midline Resp: Diminished bilaterally  with no significant wheezes noted Card: RRR, no murmurs, rubs, or gallops Abd: nontender, nondistended Extremities: no calf tenderness, no edema Vascular: 2+ radial pulses bilaterally, 2+ DP pulses bilaterally Skin: no rashes Psyc: acting appropriately   ED Results / Procedures / Treatments   Labs (all labs ordered are listed, but only abnormal results are displayed) Labs Reviewed  BASIC METABOLIC PANEL - Abnormal; Notable for the following components:      Result Value   Glucose, Bld 106 (*)    BUN 24 (*)    Creatinine, Ser 3.02 (*)    Calcium 8.4 (*)    GFR, Estimated 20 (*)    All other components within normal limits  CBC WITH  DIFFERENTIAL/PLATELET - Abnormal; Notable for the following components:   RBC 3.37 (*)    Hemoglobin 10.4 (*)    HCT 31.5 (*)    All other components within normal limits  BRAIN NATRIURETIC PEPTIDE - Abnormal; Notable for the following components:   B Natriuretic Peptide 125.5 (*)    All other components within normal limits  CBC - Abnormal; Notable for the following components:   RBC 3.53 (*)    Hemoglobin 10.5 (*)    HCT 32.4 (*)    All other components within normal limits  CREATININE, SERUM - Abnormal; Notable for the following components:   Creatinine, Ser 3.01 (*)    GFR, Estimated 20 (*)    All other components within normal limits  BLOOD GAS, VENOUS - Abnormal; Notable for the following components:   pCO2, Ven 39 (*)    pO2, Ven 47 (*)    All other components within normal limits  RESP PANEL BY RT-PCR (RSV, FLU A&B, COVID)  RVPGX2  MRSA NEXT GEN BY PCR, NASAL  TSH  HEMOGLOBIN A1C  COMPREHENSIVE METABOLIC PANEL  CBC    EKG EKG Interpretation Date/Time:  Sunday November 24 2022 12:40:05 EDT Ventricular Rate:  83 PR Interval:  198 QRS Duration:  157 QT Interval:  409 QTC Calculation: 481 R Axis:   -43  Text Interpretation: Multiform ventricular premature complexes Left bundle branch block Confirmed by Beckey Downing [phone]) on 11/24/2022 1:04:54 PM  Radiology DG Chest 2 View  Result Date: 11/24/2022 CLINICAL DATA:  Shortness of breath. EXAM: CHEST - 2 VIEW COMPARISON:  One-view chest x-ray 05/30/2020. FINDINGS: The heart is enlarged. A diffuse interstitial pattern is present with peripheral midlung densities bilaterally. Atherosclerotic changes are present at the aortic arch. The visualized soft tissues and bony thorax are unremarkable. IMPRESSION: 1. Cardiomegaly and diffuse interstitial pattern likely reflects pulmonary edema. 2. Peripheral midlung densities bilaterally likely reflect atelectasis. Electronically Signed   By: Marin Roberts M.D.   On: 11/24/2022  13:55    Procedures Procedures    Medications Ordered in ED Medications  enoxaparin (LOVENOX) injection 30 mg (has no administration in time range)  feeding supplement (GLUCERNA SHAKE) (GLUCERNA SHAKE) liquid 237 mL (237 mLs Oral Given 11/24/22 2019)  atorvastatin (LIPITOR) tablet 40 mg (has no administration in time range)  gabapentin (NEURONTIN) capsule 100 mg (has no administration in time range)  acetaminophen (TYLENOL) tablet 1,000 mg (has no administration in time range)  amLODipine (NORVASC) tablet 5 mg (has no administration in time range)  lisinopril (ZESTRIL) tablet 40 mg (has no administration in time range)  sertraline (ZOLOFT) tablet 100 mg (has no administration in time range)  terazosin (HYTRIN) capsule 5 mg (has no administration in time range)  ipratropium-albuterol (DUONEB) 0.5-2.5 (3) MG/3ML nebulizer solution 3 mL (  3 mLs Nebulization Given 11/24/22 1344)  furosemide (LASIX) injection 40 mg (40 mg Intravenous Given 11/24/22 1430)  furosemide (LASIX) injection 40 mg (40 mg Intravenous Given 11/24/22 1747)    ED Course/ Medical Decision Making/ A&P                                 Medical Decision Making 77 year old male with past medical history of diabetes, hypertension, and remote smoking history presenting to the emergency department today with shortness of breath and hypoxia.  When I do return down the patient's oxygen his pulse ox does drop to the low 80s relatively quickly.  His family reports that it was as low as in the 60s today.  I will further evaluate the patient here with basic labs well as an EKG, BNP, and chest x-ray to evaluate for pulmonary edema, pulmonary infiltrates, or pneumothorax.  The patient is diminished bilaterally.  I do not appreciate much wheezing but he does have a remote smoking history.  Will give the patient a DuoNeb.  See if this helps.  If his initial workup is unremarkable I will obtain a CT angiogram to evaluate for pulmonary embolism.   Will also obtain a COVID and flu swab on the patient.  I will reevaluate for ultimate disposition but he will likely require admission given his new oxygen requirement.  Patient was found to have pulmonary edema on his x-ray.  He is given Lasix here.  We did have to call respiratory therapy because he was becoming hypoxic on 6 L nasal cannula.  He was placed on high flow nasal cannula.  Calls placed to hospitalist service for admission.  Amount and/or Complexity of Data Reviewed Labs: ordered. Radiology: ordered.  Risk Prescription drug management. Decision regarding hospitalization.           Final Clinical Impression(s) / ED Diagnoses Final diagnoses:  Hypoxia    Rx / DC Orders ED Discharge Orders     None         Durwin Glaze, MD 11/24/22 2035

## 2022-11-24 NOTE — Plan of Care (Signed)

## 2022-11-24 NOTE — ED Notes (Signed)
Triturating patient up with oxygen. Have him on 6L nasal cannula and can only get him up to 90%. Called Respiratory and they are at bedside now accessing him.

## 2022-11-24 NOTE — H&P (Cosign Needed Addendum)
Date: 11/24/2022               Patient Name:  Jeffery Fernandez MRN: [id number]  DOB: 12-25-1945 Age / Sex: 77 y.o., male   PCP: [name], MD         Medical Service: Internal Medicine Teaching Service         Attending Physician: Dr. Erlinda Hong ,MD      First Contact: Dr. Kathleen Lime, MD Pager [phone]    Second Contact: Dr. [name], MD Pager [phone]         After Hours (After 5p/  First Contact Pager: [phone]  weekends / holidays): Second Contact Pager: [phone]   SUBJECTIVE   Chief Complaint:   History of Present Illness: Jeffery Fernandez is a 77 year old person with a Hx of HTN OSA ,T2DM on home insulin, CKD III , Chronic low back pain without Sciatica,Depression, presents to the ED due to concerns of progressive shortness of breath.Patient reports he is been having worsening shortness of breath for the past 4-5 days. His wife checked his Oxygen saturations and found to be sating at 67 % on RA. He also reports associated dry cough during this time. Patient added that he has had poor appetite for the past 3 weeks. Patient;s wife at bedside said the patient has also been been having visual hallucinations for the past 3 days. Patient reports lightheadedness  and Left Lower extremity edema Denies any prior history of dyspnea ,long flights or immobilization ,chest pain, dysuria,orthopnea. No hx of arhythmia, CHF,DVT.  Patient smoked 1.5 pack of cigarettes for over 20 years but quit 30 years ago.    ED Course: Patient presented to the ED with Blood pressure (!) 145/77, pulse 77, temperature 98.3 F (36.8 C), temperature source Oral, resp. rate 19, height 5\' 9"  (1.753 m), weight 93.9 kg, SpO2 98%. ED provider ordered a CXR, CBC,BMP,BNP,EKG. Respiratory Panel was ordered and Pt had 40 mg injection Lasix.  IMTS was then consulted to admit patient.   Past Medical History:  Diagnosis Date   Arrhythmia    Arthritis    Cataract    bilateral cataract extraction with  intraoccular lens implants   Chest pain, unspecified    Chronic airway obstruction, not elsewhere classified    PT. DENIES HE HAS COPD   CKD (chronic kidney disease)    Colon polyps    Adenomatous Polyps 2007   Depression    Diabetes mellitus    Diverticulosis    Gout    Heart murmur    History of kidney stones    Hyperlipemia    Hypertension    Nephrolithiasis    Normocytic anemia 04/23/2017   Obstructive sleep apnea (adult) (pediatric)    Other and unspecified hyperlipidemia    Other left bundle branch block    Retinal vascular occlusion, unspecified    right   Shortness of breath    on exertion   Unspecified sleep apnea    Vision loss of right eye    White coat syndrome with diagnosis of hypertension 09/19/2017     Meds:    Current Outpatient Medications on File Prior to Encounter  Medication Sig   acetaminophen (TYLENOL) 500 MG tablet Take 1,000 mg by mouth every 6 (six) hours as needed (for pain.).   amLODipine (NORVASC) 5 MG tablet Take 5 mg by mouth daily.   aspirin EC 81 MG tablet Take 325 mg by mouth daily. Swallow whole.   atorvastatin (LIPITOR) 40  MG tablet Take 40 mg by mouth at bedtime.   carvedilol (COREG) 25 MG tablet TAKE 1 TABLET BY MOUTH 2 TIMES DAILY WITH A MEAL. (Patient taking differently: Take 25 mg by mouth 2 (two) times daily with a meal.)   gabapentin (NEURONTIN) 100 MG capsule Take 100 mg by mouth 3 (three) times daily. 100mg  in the morning and evening. 200mg  at night   insulin glargine (LANTUS) 100 UNIT/ML injection Inject 40 Units into the skin daily.   lisinopril (ZESTRIL) 40 MG tablet TAKE 1 TABLET BY MOUTH EVERY DAY (Patient taking differently: Take 40 mg by mouth daily.)   sertraline (ZOLOFT) 100 MG tablet Take 100 mg by mouth daily.   terazosin (HYTRIN) 5 MG capsule TAKE 1 CAPSULE BY MOUTH EVERYDAY AT BEDTIME (Patient taking differently: Take 5 mg by mouth at bedtime.)   Turmeric (QC TUMERIC COMPLEX) 500 MG CAPS Take 1,200 mg by mouth 4 (four)  times daily.     Past Surgical History:  Procedure Laterality Date   ANTERIOR CERVICAL DECOMP/DISCECTOMY FUSION N/A 06/26/2016   Procedure: Re-Exploration of Cervical Wound;  Surgeon: Donalee Citrin, MD;  Location: [facility] OR;  Service: Neurosurgery;  Laterality: N/A;   ANTERIOR CERVICAL DECOMP/DISCECTOMY FUSION N/A 06/26/2016   Procedure: ANTERIOR CERVICAL DECOMPRESSION/DISCECTOMY FUSION, INTERBODY PROSTESIS, PLATE, CERVICAL THREE CERVICAL FOUR, CERVICAL FOUR CERVICAL FIVE CERVICAL SIX;  Surgeon: Tressie Stalker, MD;  Location: [facility] OR;  Service: Neurosurgery;  Laterality: N/A;   APPLICATION OF WOUND VAC  06/05/2020   Procedure: APPLICATION OF WOUND VAC;  Surgeon: Tressie Stalker, MD;  Location: [facility] OR;  Service: Neurosurgery;;   BACK SURGERY     CATARACT EXTRACTION Bilateral    KIDNEY STONE SURGERY     KYPHOPLASTY N/A 01/19/2018   Procedure: KYPHOPLASTY LUMBAR THREE;  Surgeon: Tressie Stalker, MD;  Location: [facility] OR;  Service: Neurosurgery;  Laterality: N/A;  KYPHOPLASTY LUMBAR THREE   LUMBAR WOUND DEBRIDEMENT N/A 06/05/2020   Procedure: LUMBAR WOUND DEBRIDEMENT;  Surgeon: Tressie Stalker, MD;  Location: [facility] OR;  Service: Neurosurgery;  Laterality: N/A;    Social:  Lives With: Wife at home  Occupation: Retired Dietitian Support: Self  Level of Function: Doesn't drive due to eye problems, Wife helps with ADLs.Patient is independent on iADLs PCP: Dr [name], MD Substances: Previous 30 pack year smoking cigarettes.Quite 30 years ago   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   OBJECTIVE:   Physical Exam: Blood pressure (!) 145/77, pulse 77, temperature 98.3 F  (36.8 C), temperature source Oral, resp. rate 19, height 5\' 9"  (1.753 m), weight 93.9 kg, SpO2 98%.   Constitutional: well-appearing man  sitting in bed, in no acute distress on HFNC 8L HENT: normocephalic atraumatic, mucous membranes moist Cardiovascular: JVD distention present Pulmonary/Chest: Mild crackles on Lower lungs base bilaterally  Abdominal: soft, non-tender, non-distended MSK: normal bulk and tone Neurological: alert & oriented x 3, 5/5 strength in bilateral upper and lower extremities Skin: warm and dry Psych: Great mood and affect  Labs:  BNP 125.5 Resp Panel - Negative  CBC    Component Value Date/Time   WBC 9.8 11/24/2022 1302   RBC 3.37 (L) 11/24/2022 1302  HGB 10.4 (L) 11/24/2022 1302   HCT 31.5 (L) 11/24/2022 1302   PLT 237 11/24/2022 1302   MCV 93.5 11/24/2022 1302   MCH 30.9 11/24/2022 1302   MCHC 33.0 11/24/2022 1302   RDW 14.4 11/24/2022 1302   LYMPHSABS 1.7 11/24/2022 1302   MONOABS 0.6 11/24/2022 1302   EOSABS 0.2 11/24/2022 1302   BASOSABS 0.1 11/24/2022 1302     CMP     Component Value Date/Time   NA 139 11/24/2022 1302   NA 142 07/03/2022 1356   K 4.1 11/24/2022 1302   CL 105 11/24/2022 1302   CO2 23 11/24/2022 1302   GLUCOSE 106 (H) 11/24/2022 1302   BUN 24 (H) 11/24/2022 1302   BUN 22 07/03/2022 1356   CREATININE 3.02 (H) 11/24/2022 1302   CALCIUM 8.4 (L) 11/24/2022 1302   PROT 7.4 07/03/2022 1356   ALBUMIN 4.1 07/03/2022 1356   AST 15 07/03/2022 1356   ALT 47 (H) 05/30/2020 0837   ALKPHOS 147 (H) 07/03/2022 1356   BILITOT 0.3 07/03/2022 1356   GFRNONAA 20 (L) 11/24/2022 1302   GFRAA 47 (L) 01/14/2018 1131    Imaging:  EKG: personally reviewed my interpretation is LBBB. Prior EKG 05/31/2022  EXAM: CHEST - 2 VIEW IMPRESSION: 1. Cardiomegaly and diffuse interstitial pattern likely reflects pulmonary edema. 2. Peripheral midlung densities bilaterally likely reflect atelectasis  ASSESSMENT & PLAN:   Assessment & Plan by  Problem: Principal Problem:   Acute hypoxic respiratory failure (HCC)   Ladarrion H Nero is a 77 y.o. person living with a history of HTN OSA ,T2DM on home insulin, CKD III , Chronic low back pain without Sciatica,Depression, who presented with shortness of breath and admitted for acute hypoxic respiratory failure   #Acute hypoxic respiratory failure Patient presents with a new onset of worsening shortness of breath for the past 4-5 days.No history of HF,COPD or any Alveolar parenchymal disease.This presentation included differentials such Airway obstruction ( Asthma, COPD ) vs pneumonia vs PE vs HF vs neuromuscular disease such as MG vs Anemia.Patient is afebrile , no leucocytosis and CXR didn't show any evidence of infection processes and Resp panel is negative. I have low concerns for pneumonia at this time. Patient has no hx of asthma or COPD. Although adult onset of asthma is not impossible, the absence of wheezing on exam, chest tightness,exercise and seasonal triggers makes me less concern for an adult onset of asthma even though it can't be ruled out completely. The absence of  hyperinflation on chest xray and absence of symptoms after 30 years of quitting smoking also makes COPD less likely in this case This patient's history and a WELL SCORE of 0 makes PE unlikely.His lost of appetite and weakness and Hgb 10.4 ( baseline of 10 -12 ) of  makes me wonder if his decrease PO intake is causing his SOB. BNP was mildly elevated 125.5.CXR showed Cardiomegaly and diffuse interstitial pattern concerning for pulmonary edema which could be due to HF or Kidney disease. This Patient's acute respiratory failure's etiology  could be multifactorial in the setting of Anemia vs Heart failure and Kidney disease. Will get a TTE to evaluate the function of the heart and give continue Laxis and try to wean down Oxygen as tolerated . - TTE  - Wean off oxygen as tolerated  - VBG  - Pulse ox check with vital signs      #Acute on Chronic Kidney Disease  #Hx of BPH Pt has a history a Chronic  kidney disease IV. Patient's GFR dropped from 45 to 23 two years ago. UA done on 10/18/2022 showed proteinuria .He follows with Dr Sheppard Coil at [facility].His presenting Cr today is 3.02 from 2.65 about 4 months ago.I wonder if he is retaining urine. Patient has gotten 40 mg Lasix but hasn't urinated yet. Hx of BPH on terazosin  - Bladder Scan  -Trend Cr - Avoid nephrotoxic agent - Strict in/out  - Continue Terazosin  #Hx of T2DM on Insulin  Patient has a history of type 2 diabetes on home insulin.  Hemoglobin A1c is at goal (5.6) from 9.32 years ago.  Patient denies polyuria or polydipsia at this time foot exam is unremarkable. - Continue home insulin regimen   #History of hypertension #Hx of HLD Patient has a history of hypertension His blood pressure is 144/75.Also has a history of HLD. - Resume home amlodipine and Coreg and lisinopril  - Resume home Lipitor 40 mg  - Resume home aspirin 81 mg   #History of depression -Continue home sertraline 100 mg  # History of chronic low back pain - Tylenol 500 mg PRN  Diet: Carb/Renal VTE: Enoxaparin Code: DNR  Prior to Admission Living Arrangement: Home, living wife Anticipated Discharge Location: Home Barriers to Discharge: Medical work up and treatment   Dispo: Admit patient to Observation with expected length of stay less than 2 midnights.  Signed: Kathleen Lime, MD Internal Medicine Resident PGY-1  11/24/2022, 5:08 PM

## 2022-11-24 NOTE — ED Notes (Signed)
Respiratory placed patient on 7L oxygen high flow nasal cannula.

## 2022-11-24 NOTE — ED Notes (Signed)
ED TO INPATIENT HANDOFF REPORT  ED Nurse Name and Phone #:  Corliss Blacker, RN [phone]  S Name/Age/Gender Jeffery Fernandez 77 y.o. male Room/Bed: 028C/028C  Code Status   Code Status: Limited: Do not attempt resuscitation (DNR) -DNR-LIMITED -Do Not Intubate/DNI   Home/SNF/Other Home Patient oriented to: self, place, time, and situation Is this baseline? Yes   Triage Complete: Triage complete  Chief Complaint Acute hypoxic respiratory failure (HCC) [J96.01]  Triage Note Patient BIB EMS today from home. Patient called out with the complaint of shortness of breath. He states that for the past week he has been having issues with new shortness of breath. Patient is also having weakness, SOB with moving and decreased appetite.  Lungs clear. No oxygen at baseline.   EMS had him on 4L nasal cannula.   150/72 CBG 197 71 HR   Allergies Allergies  Allergen Reactions   Penicillins Other (See Comments)    Possibly rash - not sure Has patient had a PCN reaction causing immediate rash, facial/tongue/throat swelling, SOB or lightheadedness with hypotension:unknown Has patient had a PCN reaction causing severe rash involving mucus membranes or skin necrosis: Unknown Has patient had a PCN reaction that required hospitalization: Unknown Has patient had a PCN reaction occurring within the last 10 years: No Childhood reaction If all of the above answers are "NO", then may proceed with Cephalosporin use.     Level of Care/Admitting Diagnosis ED Disposition     ED Disposition  Admit   Condition  --   Comment  Hospital Area: [facility] [[id number]]  Level of Care: Telemetry Cardiac [[id number]]  May place patient in observation at [facility] or [facility] if equivalent level of care is available:: No  Covid Evaluation: Confirmed COVID Negative  Diagnosis: Acute hypoxic respiratory failure [facility]) [[id number]]  Admitting Physician: Tyson Alias [[id number]]  Attending Physician:  Tyson Alias [phone]          B Medical/Surgery History Past Medical History:  Diagnosis Date   Arrhythmia    Arthritis    Cataract    bilateral cataract extraction with intraoccular lens implants   Chest pain, unspecified    Chronic airway obstruction, not elsewhere classified    PT. DENIES HE HAS COPD   CKD (chronic kidney disease)    Colon polyps    Adenomatous Polyps 2007   Depression    Diabetes mellitus    Diverticulosis    Gout    Heart murmur    History of kidney stones    Hyperlipemia    Hypertension    Nephrolithiasis    Normocytic anemia 04/23/2017   Obstructive sleep apnea (adult) (pediatric)    Other and unspecified hyperlipidemia    Other left bundle branch block    Retinal vascular occlusion, unspecified    right   Shortness of breath    on exertion   Unspecified sleep apnea    Vision loss of right eye    White coat syndrome with diagnosis of hypertension 09/19/2017   Past Surgical History:  Procedure Laterality Date   ANTERIOR CERVICAL DECOMP/DISCECTOMY FUSION N/A 06/26/2016   Procedure: Re-Exploration of Cervical Wound;  Surgeon: Donalee Citrin, MD;  Location: [facility] OR;  Service: Neurosurgery;  Laterality: N/A;   ANTERIOR CERVICAL DECOMP/DISCECTOMY FUSION N/A 06/26/2016   Procedure: ANTERIOR CERVICAL DECOMPRESSION/DISCECTOMY FUSION, INTERBODY PROSTESIS, PLATE, CERVICAL THREE CERVICAL FOUR, CERVICAL FOUR CERVICAL FIVE CERVICAL SIX;  Surgeon: Tressie Stalker, MD;  Location: [facility] OR;  Service: Neurosurgery;  Laterality: N/A;  APPLICATION OF WOUND VAC  06/05/2020   Procedure: APPLICATION OF WOUND VAC;  Surgeon: Tressie Stalker, MD;  Location: [facility] OR;  Service: Neurosurgery;;   BACK SURGERY     CATARACT EXTRACTION Bilateral    KIDNEY STONE SURGERY     KYPHOPLASTY N/A 01/19/2018   Procedure: KYPHOPLASTY LUMBAR THREE;  Surgeon: Tressie Stalker, MD;  Location: [facility] OR;  Service: Neurosurgery;  Laterality: N/A;  KYPHOPLASTY LUMBAR THREE   LUMBAR WOUND  DEBRIDEMENT N/A 06/05/2020   Procedure: LUMBAR WOUND DEBRIDEMENT;  Surgeon: Tressie Stalker, MD;  Location: [facility] OR;  Service: Neurosurgery;  Laterality: N/A;     A IV Location/Drains/Wounds Patient Lines/Drains/Airways Status     Active Line/Drains/Airways     Name Placement date Placement time Site Days   Peripheral IV 06/03/20 Anterior;Left;Upper Arm 06/03/20  1924  Arm  904   Peripheral IV 11/24/22 20 G Left Antecubital 11/24/22  1343  Antecubital  less than 1   PICC Single Lumen 06/07/20 Right Brachial 42 cm 0 cm 06/07/20  1652  Brachial  900   Negative Pressure Wound Therapy Back Lower;Posterior 06/05/20  1625  --  902            Intake/Output Last 24 hours No intake or output data in the 24 hours ending 11/24/22 1632  Labs/Imaging Results for orders placed or performed during the hospital encounter of 11/24/22 (from the past 48 hour(s))  Basic metabolic panel     Status: Abnormal   Collection Time: 11/24/22  1:02 PM  Result Value Ref Range   Sodium 139 135 - 145 mmol/L   Potassium 4.1 3.5 - 5.1 mmol/L   Chloride 105 98 - 111 mmol/L   CO2 23 22 - 32 mmol/L   Glucose, Bld 106 (H) 70 - 99 mg/dL    Comment: Glucose reference range applies only to samples taken after fasting for at least 8 hours.   BUN 24 (H) 8 - 23 mg/dL   Creatinine, Ser 7.61 (H) 0.61 - 1.24 mg/dL   Calcium 8.4 (L) 8.9 - 10.3 mg/dL   GFR, Estimated 20 (L) >60 mL/min    Comment: (NOTE) Calculated using the CKD-EPI Creatinine Equation (2021)    Anion gap 11 5 - 15    Comment: Performed at [facility], [address], [place], [place] [address]  CBC with Differential     Status: Abnormal   Collection Time: 11/24/22  1:02 PM  Result Value Ref Range   WBC 9.8 4.0 - 10.5 K/uL   RBC 3.37 (L) 4.22 - 5.81 MIL/uL   Hemoglobin 10.4 (L) 13.0 - 17.0 g/dL   HCT 10.6 (L) 26.9 - 48.5 %   MCV 93.5 80.0 - 100.0 fL   MCH 30.9 26.0 - 34.0 pg   MCHC 33.0 30.0 - 36.0 g/dL   RDW 46.2 70.3 - 50.0 %   Platelets  237 150 - 400 K/uL   nRBC 0.0 0.0 - 0.2 %   Neutrophils Relative % 74 %   Neutro Abs 7.2 1.7 - 7.7 K/uL   Lymphocytes Relative 17 %   Lymphs Abs 1.7 0.7 - 4.0 K/uL   Monocytes Relative 6 %   Monocytes Absolute 0.6 0.1 - 1.0 K/uL   Eosinophils Relative 2 %   Eosinophils Absolute 0.2 0.0 - 0.5 K/uL   Basophils Relative 1 %   Basophils Absolute 0.1 0.0 - 0.1 K/uL   Immature Granulocytes 0 %   Abs Immature Granulocytes 0.04 0.00 - 0.07 K/uL  Comment: Performed at [facility], [address], [place], [place] [address]  Brain natriuretic peptide     Status: Abnormal   Collection Time: 11/24/22  1:02 PM  Result Value Ref Range   B Natriuretic Peptide 125.5 (H) 0.0 - 100.0 pg/mL    Comment: Performed at [facility], [address], [place], [place] [address]  Resp panel by RT-PCR (RSV, Flu A&B, Covid) Anterior Nasal Swab     Status: None   Collection Time: 11/24/22  1:02 PM   Specimen: Anterior Nasal Swab  Result Value Ref Range   SARS Coronavirus 2 by RT PCR NEGATIVE NEGATIVE   Influenza A by PCR NEGATIVE NEGATIVE   Influenza B by PCR NEGATIVE NEGATIVE    Comment: (NOTE) The Xpert Xpress SARS-CoV-2/FLU/RSV plus assay is intended as an aid in the diagnosis of influenza from Nasopharyngeal swab specimens and should not be used as a sole basis for treatment. Nasal washings and aspirates are unacceptable for Xpert Xpress SARS-CoV-2/FLU/RSV testing.  Fact Sheet for Patients: BloggerCourse.com  Fact Sheet for Healthcare Providers: SeriousBroker.it  This test is not yet approved or cleared by the [place] FDA and has been authorized for detection and/or diagnosis of SARS-CoV-2 by FDA under an Emergency Use Authorization (EUA). This EUA will remain in effect (meaning this test can be used) for the duration of the COVID-19 declaration under Section 564(b)(1) of the Act, 21 U.S.C. section 360bbb-3(b)(1), unless the  authorization is terminated or revoked.     Resp Syncytial Virus by PCR NEGATIVE NEGATIVE    Comment: (NOTE) Fact Sheet for Patients: BloggerCourse.com  Fact Sheet for Healthcare Providers: SeriousBroker.it  This test is not yet approved or cleared by the [place] FDA and has been authorized for detection and/or diagnosis of SARS-CoV-2 by FDA under an Emergency Use Authorization (EUA). This EUA will remain in effect (meaning this test can be used) for the duration of the COVID-19 declaration under Section 564(b)(1) of the Act, 21 U.S.C. section 360bbb-3(b)(1), unless the authorization is terminated or revoked.  Performed at [facility], [address], [place], [place] [address]    DG Chest 2 View  Result Date: 11/24/2022 CLINICAL DATA:  Shortness of breath. EXAM: CHEST - 2 VIEW COMPARISON:  One-view chest x-ray 05/30/2020. FINDINGS: The heart is enlarged. A diffuse interstitial pattern is present with peripheral midlung densities bilaterally. Atherosclerotic changes are present at the aortic arch. The visualized soft tissues and bony thorax are unremarkable. IMPRESSION: 1. Cardiomegaly and diffuse interstitial pattern likely reflects pulmonary edema. 2. Peripheral midlung densities bilaterally likely reflect atelectasis. Electronically Signed   By: Marin Roberts M.D.   On: 11/24/2022 13:55    Pending Labs Unresulted Labs (From admission, onward)     Start     Ordered   12/01/22 0500  Creatinine, serum  (enoxaparin (LOVENOX)    CrCl < 30 ml/min)  Once,   R       Comments: while on enoxaparin therapy.    11/24/22 1617   11/25/22 0500  Comprehensive metabolic panel  Tomorrow morning,   R        11/24/22 1617   11/25/22 0500  CBC  Tomorrow morning,   R        11/24/22 1617   11/24/22 1611  TSH  Add-on,   AD        11/24/22 1617   11/24/22 1611  Hemoglobin A1c  Add-on,   AD  11/24/22 1617   11/24/22  1607  CBC  (enoxaparin (LOVENOX)    CrCl < 30 ml/min)  Once,   R       Comments: Baseline for enoxaparin therapy IF NOT ALREADY DRAWN.  Notify MD if PLT < 100 K.    11/24/22 1617   11/24/22 1607  Creatinine, serum  (enoxaparin (LOVENOX)    CrCl < 30 ml/min)  Once,   R       Comments: Baseline for enoxaparin therapy IF NOT ALREADY DRAWN.    11/24/22 1617            Vitals/Pain Today's Vitals   11/24/22 1439 11/24/22 1513 11/24/22 1515 11/24/22 1530  BP:   (!) 143/88 (!) 142/69  Pulse:   (!) 59 77  Resp:   20 20  Temp: 98.3 F (36.8 C)     TempSrc: Oral     SpO2:  96% 95% 92%  Weight:      Height:      PainSc:        Isolation Precautions No active isolations  Medications Medications  enoxaparin (LOVENOX) injection 30 mg (has no administration in time range)  ipratropium-albuterol (DUONEB) 0.5-2.5 (3) MG/3ML nebulizer solution 3 mL (3 mLs Nebulization Given 11/24/22 1344)  furosemide (LASIX) injection 40 mg (40 mg Intravenous Given 11/24/22 1430)    Mobility walks with person assist     Focused Assessments Pulmonary Assessment Handoff:  Lung sounds: Bilateral Breath Sounds: Diminished O2 Device: High Flow Nasal Cannula O2 Flow Rate (L/min): (S) 8 L/min    R Recommendations: See Admitting Provider Note  Report given to:   Additional Notes:

## 2022-11-24 NOTE — Hospital Course (Addendum)
#Acute Hypoxic Respiratory Failure likely 2/2 HFpEF Excerebration Patient with a history of CKD IV , HTN ,HLD ,Depression and chronic lower back pain presented due to a worsening SOB and was placed on HFNC. CXR on 11/24/2022 showed Cardiomegaly and diffuse interstitial pattern that reflected pulmonary edema and peripheral midlung densities and atelectasis.He was given lasix and albumin that improved his hypoxia difficultly from requiring 11 L to 5 L with 98% saturations.An ECHO done on 11/25/2022 showed showed LVEF of 55-60,mild concentric hypertrophy of LV.Patient receive a diagnosis of heart failure with preserve ejection fraction at this time. .  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  There was also evidence of mildly enlarged mediastinal and right hilar lymph nodes as well as solid pulmonary nodule at the lingula measuring 1.3 cm.  Patient was recommended to follow-up with outpatient pulmonology for this new CT findings.  Patient continued to have positive orthostatics and decompensated requiring increased oxygen required from 2 L to 5 L.The patient was  started on midodrine 5 mg BID for borderline hypotension. He later go weaned down to 3L sating at 91 %. 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 At baseline. Presented for acute execration of HEFpEF.Nephrology was consulted who recommended addition Laxis with albumin.Patient improved significantly after Laxis and albumin. Patient is scheduled to follow up with Dr Ronalee Belts at [facility]    #Hx of BPH Patients terazosin was held during this hospitalization due to positive orthostatic signs .    #Hx of HTN Patients blood pressure was persistently low during this hospitalization especially during orthostatics. He also had a acute  on chronic kidney injury so we stopped his Lisinopril.   #Hx of HLD - Continue Amlodipine. Hold Lisinopril - Continue home Lipitor 40 mg  - Continue ASA 81 mg   #Hx of Depression - Sertraline 100 mg  - Continue Bupropion    #Hx of chronic low back pain -Patient was continued on home Tylenol 500 mg as needed    Dear Mr Marchi, It was a pleasure taking care of you at [facility]. You were admitted for shortness of breath. We are discharging you home now that you are doing better. Please follow the following instructions:   1) You were diagnosed with heart failure with preserved ejection fraction which means that your heart is retaining fluid even though it is pumping adequately. Your breathing improved, but you will still require some supplemental oxygen needs. We removed fluids from your body, which is why your blood pressure was slightly lower when you stand up. For that reason, we have added compression stocking, abdominal binders, and the following changes to your medications:   - Pause using Torsemide 40 mg for the next 48 hours  - then, restart this medication; you can stop it if you feel lightheaded or dizzy.  - Start taking midodrine 5 mg every 12 hours for the feeling of lightheadedness  - Please follow up with your primary care physician in one week and with the lung doctors in the next 2 weeks - You should make a follow up appointment with cardiology at their outpatient clinic in the next few weeks   - Call your doctor if have          You have  any trouble breathing, including waking up at night or needing more pillows than usual to sleep.         You have wheezing or chest tightness when resting.         You have a very fast or irregular heartbeat.         You cough more than normal, or cough up frothy or pink saliva.         You feel more tired than normal.         Your weight goes up by 2 or more pounds (1 kilogram) in 1 day, or 4 or more pounds (2 kilograms) in 1  week.         You have more swelling than usual, especially in your feet and ankles or in your belly.  2)  You were found to have a disease called interstitial lung disease, a mass in your right lung , as well as a nodule in the base on your left lung. Lung doctors who saw your in the hospital are concerned that these new masses are concerning for malignancy and this needs to be evaluated further.    - PLEASE follow up with  Dr. Delton Coombes or [facility] in follow-up for repeat imaging  3) You have a history of Chronic Kidney disease stage 4. We stopped your Lisinopril and amlodipine while your were in the hospital   - Do not take your Lisinopril or amlodipine until your follow up with Dr. Ronalee Belts  at [facility].     - Preferred narcotic agents for pain control are hydromorphone, fentanyl, and methadone. Morphine should not be used.     - Baclofen should be avoided    - Avoid oral sodium phosphate and magnesium citrate based laxatives / bowel preps   4)Please stop your terazosin until you are seen in your primary care doctor's office. Please make an appointment to be seen by your primary care doctor in the next week.  5)Please continue doing physical therapy with the home health agency   Take care,  Dr. Kathleen Lime, MD [name], MD

## 2022-11-25 ENCOUNTER — Observation Stay (HOSPITAL_COMMUNITY): Payer: HMO

## 2022-11-25 DIAGNOSIS — J679 Hypersensitivity pneumonitis due to unspecified organic dust: Secondary | ICD-10-CM | POA: Diagnosis present

## 2022-11-25 DIAGNOSIS — Z794 Long term (current) use of insulin: Secondary | ICD-10-CM | POA: Diagnosis not present

## 2022-11-25 DIAGNOSIS — H5461 Unqualified visual loss, right eye, normal vision left eye: Secondary | ICD-10-CM | POA: Diagnosis present

## 2022-11-25 DIAGNOSIS — Z79899 Other long term (current) drug therapy: Secondary | ICD-10-CM | POA: Diagnosis not present

## 2022-11-25 DIAGNOSIS — E1122 Type 2 diabetes mellitus with diabetic chronic kidney disease: Secondary | ICD-10-CM

## 2022-11-25 DIAGNOSIS — I130 Hypertensive heart and chronic kidney disease with heart failure and stage 1 through stage 4 chronic kidney disease, or unspecified chronic kidney disease: Secondary | ICD-10-CM | POA: Diagnosis present

## 2022-11-25 DIAGNOSIS — N1832 Chronic kidney disease, stage 3b: Secondary | ICD-10-CM

## 2022-11-25 DIAGNOSIS — Z1152 Encounter for screening for COVID-19: Secondary | ICD-10-CM | POA: Diagnosis not present

## 2022-11-25 DIAGNOSIS — E785 Hyperlipidemia, unspecified: Secondary | ICD-10-CM | POA: Diagnosis present

## 2022-11-25 DIAGNOSIS — I5030 Unspecified diastolic (congestive) heart failure: Secondary | ICD-10-CM | POA: Diagnosis not present

## 2022-11-25 DIAGNOSIS — J4489 Other specified chronic obstructive pulmonary disease: Secondary | ICD-10-CM | POA: Diagnosis present

## 2022-11-25 DIAGNOSIS — N184 Chronic kidney disease, stage 4 (severe): Secondary | ICD-10-CM | POA: Diagnosis present

## 2022-11-25 DIAGNOSIS — N400 Enlarged prostate without lower urinary tract symptoms: Secondary | ICD-10-CM | POA: Diagnosis present

## 2022-11-25 DIAGNOSIS — N179 Acute kidney failure, unspecified: Secondary | ICD-10-CM | POA: Diagnosis present

## 2022-11-25 DIAGNOSIS — R0609 Other forms of dyspnea: Secondary | ICD-10-CM

## 2022-11-25 DIAGNOSIS — I158 Other secondary hypertension: Secondary | ICD-10-CM

## 2022-11-25 DIAGNOSIS — D649 Anemia, unspecified: Secondary | ICD-10-CM | POA: Diagnosis not present

## 2022-11-25 DIAGNOSIS — J9601 Acute respiratory failure with hypoxia: Secondary | ICD-10-CM | POA: Diagnosis present

## 2022-11-25 DIAGNOSIS — Z515 Encounter for palliative care: Secondary | ICD-10-CM | POA: Diagnosis not present

## 2022-11-25 DIAGNOSIS — G4733 Obstructive sleep apnea (adult) (pediatric): Secondary | ICD-10-CM | POA: Diagnosis present

## 2022-11-25 DIAGNOSIS — Z87891 Personal history of nicotine dependence: Secondary | ICD-10-CM | POA: Diagnosis not present

## 2022-11-25 DIAGNOSIS — I472 Ventricular tachycardia, unspecified: Secondary | ICD-10-CM | POA: Diagnosis not present

## 2022-11-25 DIAGNOSIS — Z66 Do not resuscitate: Secondary | ICD-10-CM | POA: Diagnosis present

## 2022-11-25 DIAGNOSIS — F32A Depression, unspecified: Secondary | ICD-10-CM | POA: Diagnosis present

## 2022-11-25 DIAGNOSIS — J84112 Idiopathic pulmonary fibrosis: Secondary | ICD-10-CM | POA: Diagnosis present

## 2022-11-25 DIAGNOSIS — R0902 Hypoxemia: Secondary | ICD-10-CM | POA: Diagnosis present

## 2022-11-25 DIAGNOSIS — I5031 Acute diastolic (congestive) heart failure: Secondary | ICD-10-CM | POA: Diagnosis present

## 2022-11-25 DIAGNOSIS — D631 Anemia in chronic kidney disease: Secondary | ICD-10-CM | POA: Diagnosis present

## 2022-11-25 DIAGNOSIS — Z888 Allergy status to other drugs, medicaments and biological substances status: Secondary | ICD-10-CM | POA: Diagnosis not present

## 2022-11-25 DIAGNOSIS — J9811 Atelectasis: Secondary | ICD-10-CM | POA: Diagnosis present

## 2022-11-25 LAB — RENAL FUNCTION PANEL
Albumin: 2.6 g/dL — ABNORMAL LOW (ref 3.5–5.0)
Albumin: 2.5 g/dL — ABNORMAL LOW (ref 3.5–5.0)
Anion gap: 10 (ref 5–15)
Anion gap: 13 (ref 5–15)
BUN: 26 mg/dL — ABNORMAL HIGH (ref 8–23)
BUN: 32 mg/dL — ABNORMAL HIGH (ref 8–23)
CO2: 25 mmol/L (ref 22–32)
CO2: 22 mmol/L (ref 22–32)
Calcium: 8.6 mg/dL — ABNORMAL LOW (ref 8.9–10.3)
Calcium: 8.4 mg/dL — ABNORMAL LOW (ref 8.9–10.3)
Chloride: 105 mmol/L (ref 98–111)
Chloride: 103 mmol/L (ref 98–111)
Creatinine, Ser: 3.21 mg/dL — ABNORMAL HIGH (ref 0.61–1.24)
Creatinine, Ser: 3.15 mg/dL — ABNORMAL HIGH (ref 0.61–1.24)
GFR, Estimated: 19 mL/min — ABNORMAL LOW (ref >60)
GFR, Estimated: 19 mL/min — ABNORMAL LOW (ref >60)
Glucose, Bld: 147 mg/dL — ABNORMAL HIGH (ref 70–99)
Glucose, Bld: 113 mg/dL — ABNORMAL HIGH (ref 70–99)
Phosphorus: 4.2 mg/dL (ref 2.5–4.6)
Phosphorus: 4.6 mg/dL (ref 2.5–4.6)
Potassium: 4.8 mmol/L (ref 3.5–5.1)
Potassium: 3.8 mmol/L (ref 3.5–5.1)
Sodium: 140 mmol/L (ref 135–145)
Sodium: 138 mmol/L (ref 135–145)

## 2022-11-25 LAB — ECHOCARDIOGRAM COMPLETE
AR max vel: 1.84 cm2
AV Area VTI: 1.86 cm2
AV Area mean vel: 1.85 cm2
AV Mean grad: 5 mm[Hg]
AV Peak grad: 10.1 mm[Hg]
Ao pk vel: 1.59 m/s
Area-P 1/2: 3.37 cm2
Calc EF: 63.9 %
Height: 69 in
S' Lateral: 4.2 cm
Single Plane A2C EF: 52.3 %
Single Plane A4C EF: 71.8 %
Weight: 3086.44 [oz_av]

## 2022-11-25 LAB — GLUCOSE, CAPILLARY: Glucose-Capillary: 130 mg/dL — ABNORMAL HIGH (ref 70–99)

## 2022-11-25 LAB — CBC
HCT: 29.8 % — ABNORMAL LOW (ref 39.0–52.0)
Hemoglobin: 9.8 g/dL — ABNORMAL LOW (ref 13.0–17.0)
MCH: 30.4 pg (ref 26.0–34.0)
MCHC: 32.9 g/dL (ref 30.0–36.0)
MCV: 92.5 fL (ref 80.0–100.0)
Platelets: 235 10*3/uL (ref 150–400)
RBC: 3.22 MIL/uL — ABNORMAL LOW (ref 4.22–5.81)
RDW: 14 % (ref 11.5–15.5)
WBC: 8.9 10*3/uL (ref 4.0–10.5)
nRBC: 0 % (ref 0.0–0.2)

## 2022-11-25 LAB — COMPREHENSIVE METABOLIC PANEL
ALT: 9 U/L (ref 0–44)
AST: 10 U/L — ABNORMAL LOW (ref 15–41)
Albumin: 2.5 g/dL — ABNORMAL LOW (ref 3.5–5.0)
Alkaline Phosphatase: 87 U/L (ref 38–126)
Anion gap: 9 (ref 5–15)
BUN: 24 mg/dL — ABNORMAL HIGH (ref 8–23)
CO2: 25 mmol/L (ref 22–32)
Calcium: 8.3 mg/dL — ABNORMAL LOW (ref 8.9–10.3)
Chloride: 103 mmol/L (ref 98–111)
Creatinine, Ser: 3.11 mg/dL — ABNORMAL HIGH (ref 0.61–1.24)
GFR, Estimated: 20 mL/min — ABNORMAL LOW (ref >60)
Glucose, Bld: 105 mg/dL — ABNORMAL HIGH (ref 70–99)
Potassium: 4 mmol/L (ref 3.5–5.1)
Sodium: 137 mmol/L (ref 135–145)
Total Bilirubin: 0.5 mg/dL (ref 0.3–1.2)
Total Protein: 6.5 g/dL (ref 6.5–8.1)

## 2022-11-25 LAB — MAGNESIUM: Magnesium: 2.1 mg/dL (ref 1.7–2.4)

## 2022-11-25 MED ORDER — FUROSEMIDE 10 MG/ML IJ SOLN
80.0000 mg | Freq: Once | INTRAMUSCULAR | Status: AC
  Administered 2022-11-25: 80 mg via INTRAVENOUS
  Filled 2022-11-25: qty 8

## 2022-11-25 MED ORDER — MELATONIN 3 MG PO TABS: 3.0000 mg | ORAL_TABLET | Freq: Every day | ORAL | Status: DC

## 2022-11-25 MED ORDER — FUROSEMIDE 10 MG/ML IJ SOLN
40.0000 mg | Freq: Once | INTRAMUSCULAR | Status: AC
  Administered 2022-11-25: 40 mg via INTRAVENOUS
  Filled 2022-11-25: qty 4

## 2022-11-25 MED ORDER — MELATONIN 5 MG PO TABS
5.0000 mg | ORAL_TABLET | Freq: Every day | ORAL | Status: DC
  Administered 2022-11-25 – 2022-12-02 (×8): 5 mg via ORAL
  Filled 2022-11-25 (×8): qty 1

## 2022-11-25 MED ORDER — BUPROPION HCL ER (XL) 150 MG PO TB24
150.0000 mg | ORAL_TABLET | Freq: Every morning | ORAL | Status: DC
  Administered 2022-11-25 – 2022-12-03 (×9): 150 mg via ORAL
  Filled 2022-11-25 (×9): qty 1

## 2022-11-25 MED ORDER — PERFLUTREN LIPID MICROSPHERE
1.0000 mL | INTRAVENOUS | Status: AC | PRN
  Administered 2022-11-25: 2 mL via INTRAVENOUS

## 2022-11-25 NOTE — Progress Notes (Signed)
Echocardiogram 2D Echocardiogram has been performed.  Jeffery Fernandez 11/25/2022, 9:42 AM

## 2022-11-25 NOTE — Plan of Care (Signed)
  Problem: Education: Goal: Knowledge of General Education information will improve Description Including pain rating scale, medication(s)/side effects and non-pharmacologic comfort measures Outcome: Progressing   

## 2022-11-25 NOTE — Progress Notes (Signed)
Chaplain responded to [facility] consult for Advance Directive. Jeffery Jeffery Fernandez was accompanied by his spouse and daughter. He had already completed AD paperwork. Chaplain reviewed paperwork and answered additional questions regarding artificial nutrition and hydration and the provision to follow the documents or follow the health care agent. Pt is ready for documents to be notarized and chaplain will have team follow up for notarization within 24 hours. Pt plans to be here through tomorrow at least.  Mrs Jeffery Fernandez requested a copy of AD, which I provided as well as flyer for ACPs and information on how to complete them outside of the hospital.  Pt's daughter also had additional questions regarding DNR. She reports they thought the ADs covered some of that and were surprised that the doctor asked them if he was a DNR in the ED. Chaplain explained that many people have strong preferences regarding resuscitation and our physicians want to honor patient wishes in urgent situations. Chaplain encouraged pt and family to discuss this more with the medical team for clarification on Jeffery Fernandez current DNR status.  Jeffery Fernandez. Jeffery Fernandez, M.Div. [facility] Chaplain Pager [phone] Office [phone]

## 2022-11-25 NOTE — Progress Notes (Addendum)
HD#0 SUBJECTIVE:  Patient Summary: Jeffery Fernandez is a 77 y.o. with a pertinent PMH of HTN ,OSA ,T2DM CKD III and Chronic back pain, who presented with SOB  and admitted for Acute hypoxic respiratory failure .   Overnight Events: Patient continued to require increased oxygen requirement mostly on exertion   Interim History: Patient reports unable to sleep due to multiple bathroom trips to urinate. Other than that, he doesn't have any other complaints.   OBJECTIVE:  Vital Signs: Vitals:   11/24/22 2347 11/25/22 0302 11/25/22 0420 11/25/22 0819  BP: 134/71 127/84  (!) 123/94  Pulse: 79 80  92  Resp: 20 20  17   Temp: 98.7 F (37.1 C) 98.1 F (36.7 C)  98.1 F (36.7 C)  TempSrc: Oral Oral  Oral  SpO2: 95% 93%  94%  Weight:   87.5 kg   Height:       Supplemental O2: HFNC SpO2: 94 % O2 Flow Rate (L/min): 10.5 L/min  Filed Weights   11/24/22 1241 11/24/22 1750 11/25/22 0420  Weight: 93.9 kg 88.3 kg 87.5 kg     Intake/Output Summary (Last 24 hours) at 11/25/2022 1050 Last data filed at 11/25/2022 0700 Gross per 24 hour  Intake 680 ml  Output 1300 ml  Net -620 ml   Net IO Since Admission: -620 mL [11/25/22 1050]  Physical Exam: Physical Exam Constitutional:      Comments: Mild acute distress   Cardiovascular:     Rate and Rhythm: Normal rate.     Comments: No S3 appreciated. Normal heart sounds  Pulmonary:     Effort: Tachypnea present.     Comments: Increase tachypnea when laying flat Crackles presents on Lower lobes bilaterally  Abdominal:     Palpations: Abdomen is soft.  Musculoskeletal:     Right lower leg: No edema.     Left lower leg: No edema.  Skin:    General: Skin is warm and dry.  Neurological:     General: No focal deficit present.     Mental Status: He is alert.  Psychiatric:        Mood and Affect: Mood normal.        Behavior: Behavior normal.     Patient Lines/Drains/Airways Status     Active Line/Drains/Airways     Name Placement  date Placement time Site Days   Peripheral IV 11/24/22 20 G Left Antecubital 11/24/22  1343  Antecubital  1             ASSESSMENT/PLAN:  Assessment: Principal Problem:   Acute hypoxic respiratory failure (HCC)   Plan: #Acute Hypoxic Respiratory Failure likely 2/2 HF  On 13 L sating at 93 %. Pending TTE today. Continuing Lasix 80 mg  - Continue IV Lasix  - Follow up on TTE read - Oxygen therapy as needed - Daily weights - PT/OT  #Hx Chronic Kidney Disease IV Hx of CKD IV, GFR of (22-24). Fairly unchanged today - Daily RFP -  Magnesium at 1 PM  #Hx of BPH - Continue home Terazosin 5 mg  #Hx of HTN #Hx of HLD - Continue Amlodipine. Hold Lisinopril - Continue home Lipitor 40 mg  - Continue ASA 81 mg  #Hx of Depression - Sertraline 100 mg  - Continue Bupropion   #Hx of chronic low back pain - Tylenol 500 mg as needed    Best Practice: Diet: Renal diet VTE: enoxaparin (LOVENOX) injection 30 mg Start: 11/25/22 1000 Code: DNR Family Contact: Wife, at  bedside. DISPO: Anticipated discharge tomorrow to Home pending New oxygen.  Signature: Kathleen Lime , MD Internal Medicine Resident, PGY-1 [facility]  Pager: [phone] 10:50 AM, 11/25/2022   Please contact the on call pager after 5 pm and on weekends at [phone].

## 2022-11-26 DIAGNOSIS — I5032 Chronic diastolic (congestive) heart failure: Secondary | ICD-10-CM

## 2022-11-26 DIAGNOSIS — I5031 Acute diastolic (congestive) heart failure: Secondary | ICD-10-CM | POA: Diagnosis not present

## 2022-11-26 DIAGNOSIS — J9601 Acute respiratory failure with hypoxia: Secondary | ICD-10-CM | POA: Diagnosis not present

## 2022-11-26 LAB — RENAL FUNCTION PANEL
Albumin: 2.6 g/dL — ABNORMAL LOW (ref 3.5–5.0)
Albumin: 2.5 g/dL — ABNORMAL LOW (ref 3.5–5.0)
Anion gap: 11 (ref 5–15)
Anion gap: 11 (ref 5–15)
BUN: 30 mg/dL — ABNORMAL HIGH (ref 8–23)
BUN: 37 mg/dL — ABNORMAL HIGH (ref 8–23)
CO2: 23 mmol/L (ref 22–32)
CO2: 26 mmol/L (ref 22–32)
Calcium: 8.5 mg/dL — ABNORMAL LOW (ref 8.9–10.3)
Calcium: 8.4 mg/dL — ABNORMAL LOW (ref 8.9–10.3)
Chloride: 104 mmol/L (ref 98–111)
Chloride: 101 mmol/L (ref 98–111)
Creatinine, Ser: 3.27 mg/dL — ABNORMAL HIGH (ref 0.61–1.24)
Creatinine, Ser: 3.44 mg/dL — ABNORMAL HIGH (ref 0.61–1.24)
GFR, Estimated: 18 mL/min — ABNORMAL LOW (ref >60)
GFR, Estimated: 17 mL/min — ABNORMAL LOW (ref >60)
Glucose, Bld: 119 mg/dL — ABNORMAL HIGH (ref 70–99)
Glucose, Bld: 161 mg/dL — ABNORMAL HIGH (ref 70–99)
Phosphorus: 4.8 mg/dL — ABNORMAL HIGH (ref 2.5–4.6)
Phosphorus: 5.4 mg/dL — ABNORMAL HIGH (ref 2.5–4.6)
Potassium: 3.9 mmol/L (ref 3.5–5.1)
Potassium: 4.1 mmol/L (ref 3.5–5.1)
Sodium: 138 mmol/L (ref 135–145)
Sodium: 138 mmol/L (ref 135–145)

## 2022-11-26 LAB — CBC
HCT: 31.1 % — ABNORMAL LOW (ref 39.0–52.0)
Hemoglobin: 10.1 g/dL — ABNORMAL LOW (ref 13.0–17.0)
MCH: 29.7 pg (ref 26.0–34.0)
MCHC: 32.5 g/dL (ref 30.0–36.0)
MCV: 91.5 fL (ref 80.0–100.0)
Platelets: 237 10*3/uL (ref 150–400)
RBC: 3.4 MIL/uL — ABNORMAL LOW (ref 4.22–5.81)
RDW: 14 % (ref 11.5–15.5)
WBC: 10.4 10*3/uL (ref 4.0–10.5)
nRBC: 0 % (ref 0.0–0.2)

## 2022-11-26 LAB — GLUCOSE, CAPILLARY: Glucose-Capillary: 120 mg/dL — ABNORMAL HIGH (ref 70–99)

## 2022-11-26 MED ORDER — FUROSEMIDE 10 MG/ML IJ SOLN
40.0000 mg | Freq: Once | INTRAMUSCULAR | Status: AC
  Administered 2022-11-26: 40 mg via INTRAVENOUS
  Filled 2022-11-26: qty 4

## 2022-11-26 MED ORDER — FUROSEMIDE 10 MG/ML IJ SOLN
80.0000 mg | Freq: Once | INTRAMUSCULAR | Status: AC
  Administered 2022-11-26: 80 mg via INTRAVENOUS
  Filled 2022-11-26: qty 8

## 2022-11-26 MED ORDER — ASPIRIN 81 MG PO TBEC
81.0000 mg | DELAYED_RELEASE_TABLET | Freq: Every day | ORAL | Status: DC
  Administered 2022-11-26 – 2022-12-03 (×8): 81 mg via ORAL
  Filled 2022-11-26 (×8): qty 1

## 2022-11-26 NOTE — Progress Notes (Signed)
Desats . To 87% on 15 L HFNC. Started to to use IS managed to do it 10x at 1000 ml with good technique. Sats went up to  93% . Encouraged to frequently  use IS while he can. Daughter at bedside trying to help out. Continue to monitor.

## 2022-11-26 NOTE — Progress Notes (Addendum)
     Chaplain follow up visit to complete Advance Directive per request of Mr Beere. In the presence of Dance movement psychotherapist and two witnesses chaplain verified that Mr Goda had completed the documents himself and that the wishes indicated in the documents were his alone. Chaplain verified his identity via his wristband and assisted with the completion of signing and witnessing. Chaplain made 3 photocopies of the documents and emailed them to [email or web address] Chaplain labeled one of the copies and placed it on his patient chart. Chaplain gave the original document and the remaining copies to the patient and reminded him to notify physicians at other medical practices that he has completed these documents. Mr Oetken is aware that completing new documents voids these ACP documents, but that there is no national registry of the documents, so he will still need to update individuals who may have old copies.  Mr Gottshall has named the following as his health care agent:  1: Concha Pyo [place] [phone] 2-Teresa Reineck Ashley Royalty  [phone]    Maryanna Shape. Carley Hammed, M.Div. [facility] Chaplain Pager [phone] Office [phone].   11/26/22 1000  Spiritual Encounters  Type of Visit Follow up  Care provided to: Pt and family  Conversation partners present during Programmer, systems;Other (comment)  Referral source Patient request  Reason for visit Advance directives  Advance Directives (For Healthcare)  Does Patient Have a Medical Advance Directive? Yes  Type of Advance Directive Living will;Healthcare Power of Attorney  Copy of Healthcare Power of Attorney in Chart? Yes - validated most recent copy scanned in chart (See row information)  Copy of Living Will in Chart? Yes - validated most recent copy scanned in chart (See row information)

## 2022-11-26 NOTE — Progress Notes (Addendum)
HD#1 SUBJECTIVE:  Patient Summary: Jeffery Fernandez is a 77 y.o. with a pertinent PMH of HTN ,OSA ,T2DM CKD III and Chronic back pain, who presented with SOB  and admitted for Acute hypoxic respiratory failure found to have HFpEF  Overnight Events:  ECHO  showed LVEF of 55-60,mild concentric hypertrophy OF LV.No other structural abnormalities Remained on 15L HFNC sating at 93%   Interim History: Patient said he slept better last night. Reports ongoing visual hallucinations that he had prior to this hospitalization. Family's concerns regarding CODE STATUS was adequately addressed.  OBJECTIVE:  Vital Signs: Vitals:   11/25/22 2032 11/26/22 0018 11/26/22 0406 11/26/22 0700  BP: (!) 142/74 115/77 118/70 131/73  Pulse: 83 88 88   Resp: 20 18 18    Temp: 98.5 F (36.9 C) 98.4 F (36.9 C) 98.1 F (36.7 C) 98 F (36.7 C)  TempSrc: Oral Oral Oral Oral  SpO2: 95% 90% 90%   Weight:   84.9 kg   Height:       Supplemental O2: HFNC SpO2: 90 % O2 Flow Rate (L/min): 15 L/min  Filed Weights   11/24/22 1750 11/25/22 0420 11/26/22 0406  Weight: 88.3 kg 87.5 kg 84.9 kg     Intake/Output Summary (Last 24 hours) at 11/26/2022 0857 Last data filed at 11/26/2022 0400 Gross per 24 hour  Intake 240 ml  Output 2600 ml  Net -2360 ml   Net IO Since Admission: -2,980 mL [11/26/22 0857]  Physical Exam: Physical Exam Constitutional:      Comments: Sitting on bed ,working with occupational therapy   Cardiovascular:     Rate and Rhythm: Normal rate.     Comments: No S3 appreciated. Normal heart sounds  Pulmonary:     Effort: Tachypnea present.     Comments: Mild crackles still present at Lower lobes bilaterally  Abdominal:     Palpations: Abdomen is soft.  Musculoskeletal:     Right lower leg: No edema.     Left lower leg: No edema.  Skin:    General: Skin is warm and dry.  Neurological:     General: No focal deficit present.     Mental Status: He is alert.  Psychiatric:        Mood  and Affect: Mood normal.        Behavior: Behavior normal.     Patient Lines/Drains/Airways Status     Active Line/Drains/Airways     Name Placement date Placement time Site Days   Peripheral IV 11/24/22 20 G Left Antecubital 11/24/22  1343  Antecubital  1             ASSESSMENT/PLAN:  Assessment: Principal Problem:   Acute hypoxic respiratory failure (HCC)   Plan: #Acute Hypoxic Respiratory Failure  2/2 HFpEF On 15 L sating at 95 %. 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 - Avoid Nephrotoxic agents  - Daily RFP to monitor Kidney function   #Hx of OSA - CPAP at night   #Hx of BPH - Continue home Terazosin 5 mg  #Hx of HTN #Hx of HLD - Continue Amlodipine and monitor vitals  - Continue home Lipitor 40 mg  - Continue ASA 81 mg  #Hx of Depression - Continue home Sertraline  -  Continue Bupropion 150 mg  #Hx of chronic low back pain - Tylenol 500 mg as needed   Best Practice: Diet: Renal diet VTE: enoxaparin (LOVENOX) injection 30 mg Start: 11/25/22 1000 Code: DNR Family Contact: Wife, at bedside. DISPO: Anticipated discharge tomorrow to Home pending New oxygen.  Signature: Kathleen Lime , MD Internal Medicine Resident, PGY-1 [facility]  Pager: [phone] 8:57 AM, 11/26/2022   Please contact the on call pager after 5 pm and on weekends at [phone].

## 2022-11-26 NOTE — Evaluation (Signed)
Physical Therapy Evaluation Patient Details Name: Jeffery Fernandez MRN: [id number] DOB: 1946/01/21 Today's Date: 11/26/2022  History of Present Illness  77 y.o. presented with SOB and admitted for Acute hypoxic respiratory failure on 10/27. Pt with HFpEF. PMH of HTN ,OSA ,T2DM CKD III and Chronic back pain  Clinical Impression  Pt admitted with above diagnosis and presents to PT with functional limitations due to deficits listed below (See PT problem list). Pt needs skilled PT to maximize independence and safety. Pt on 15L HFNC. Poor pleth with sensor on finger. Switched sensor to earlobe with improved pleth. SpO2 100% at rest and >90% with activity when good pleth. Notified nursing of move to ear lobe with improved pleth. Pt requiring assist with mobility due to decr strength and balance. Will need some assist at home and recommend HHPT.           If plan is discharge home, recommend the following: A little help with walking and/or transfers;Help with stairs or ramp for entrance;A little help with bathing/dressing/bathroom;Assistance with cooking/housework   Can travel by private vehicle        Equipment Recommendations None recommended by PT  Recommendations for Other Services       Functional Status Assessment Patient has had a recent decline in their functional status and demonstrates the ability to make significant improvements in function in a reasonable and predictable amount of time.     Precautions / Restrictions Precautions Precautions: Fall;Other (comment) Precaution Comments: Watch SpO2 Restrictions Weight Bearing Restrictions: No      Mobility  Bed Mobility Overal bed mobility: Needs Assistance Bed Mobility: Sit to Supine       Sit to supine: Supervision        Transfers Overall transfer level: Needs assistance Equipment used: Rolling walker (2 wheels), 1 person hand held assist Transfers: Sit to/from Stand, Bed to chair/wheelchair/BSC Sit to Stand: Min  assist   Step pivot transfers: Min assist       General transfer comment: Assist for balance. Pt with posterior lean    Ambulation/Gait Ambulation/Gait assistance: Min assist Gait Distance (Feet): 40 Feet Assistive device: Rolling walker (2 wheels) Gait Pattern/deviations: Step-through pattern, Decreased stride length, Leaning posteriorly Gait velocity: decr Gait velocity interpretation: <1.8 ft/sec, indicate of risk for recurrent falls   General Gait Details: Assist for balance. Pt with slight posterior lean  Stairs            Wheelchair Mobility     Tilt Bed    Modified Rankin (Stroke Patients Only)       Balance Overall balance assessment: Needs assistance Sitting-balance support: No upper extremity supported, Feet supported Sitting balance-Leahy Scale: Good     Standing balance support: Single extremity supported Standing balance-Leahy Scale: Poor Standing balance comment: UE support and min assist due to posterior lean                             Pertinent Vitals/Pain Pain Assessment Pain Assessment: No/denies pain    Home Living Family/patient expects to be discharged to:: Private residence Living Arrangements: Spouse/significant other Available Help at Discharge: Family;Available 24 hours/day Type of Home: House Home Access: Ramped entrance       Home Layout: One level Home Equipment: Agricultural consultant (2 wheels);Cane - single point;Grab bars - toilet;Shower seat      Prior Function Prior Level of Function : Independent/Modified Independent;Driving  Mobility Comments: Household distances without AD, chronic back pain ADLs Comments: Spouse assists with iADL and yard work.  No assist with ADL     Extremity/Trunk Assessment   Upper Extremity Assessment Upper Extremity Assessment: Defer to OT evaluation    Lower Extremity Assessment Lower Extremity Assessment: Generalized weakness    Cervical / Trunk  Assessment Cervical / Trunk Assessment: Kyphotic  Communication   Communication Communication: No apparent difficulties  Cognition Arousal: Alert Behavior During Therapy: WFL for tasks assessed/performed Overall Cognitive Status: Within Functional Limits for tasks assessed                                          General Comments General comments (skin integrity, edema, etc.): Pt on 15L HFNC. Poor pleth with sensor on finger. Switched sensor to earlobe with improved pleth. SpO2 100% at rest and >90% with activity when good pleth. Notified nursing of move to ear lobe with improved pleth.    Exercises     Assessment/Plan    PT Assessment Patient needs continued PT services  PT Problem List Decreased strength;Decreased balance;Decreased mobility;Decreased activity tolerance;Decreased knowledge of use of DME       PT Treatment Interventions DME instruction;Gait training;Therapeutic activities;Therapeutic exercise;Balance training;Functional mobility training;Patient/family education    PT Goals (Current goals can be found in the Care Plan section)  Acute Rehab PT Goals Patient Stated Goal: return home PT Goal Formulation: With patient Time For Goal Achievement: 12/10/22 Potential to Achieve Goals: Good    Frequency Min 1X/week     Co-evaluation               AM-PAC PT "6 Clicks" Mobility  Outcome Measure Help needed turning from your back to your side while in a flat bed without using bedrails?: None Help needed moving from lying on your back to sitting on the side of a flat bed without using bedrails?: A Little Help needed moving to and from a bed to a chair (including a wheelchair)?: A Little Help needed standing up from a chair using your arms (e.g., wheelchair or bedside chair)?: A Little Help needed to walk in hospital room?: A Little Help needed climbing 3-5 steps with a railing? : A Lot 6 Click Score: 18    End of Session Equipment Utilized  During Treatment: Oxygen Activity Tolerance: Patient limited by fatigue Patient left: in bed;with call bell/phone within reach;with bed alarm set Nurse Communication: Mobility status;Other (comment) (move of O2 sensor) PT Visit Diagnosis: Unsteadiness on feet (R26.81);Muscle weakness (generalized) (M62.81)    Time: 0865-7846 PT Time Calculation (min) (ACUTE ONLY): 20 min   Charges:   PT Evaluation $PT Eval Moderate Complexity: 1 Mod   PT General Charges $$ ACUTE PT VISIT: 1 Visit         [facility] PT Acute Rehabilitation Services Office [phone]   Angelina [facility] 11/26/2022, 12:37 PM

## 2022-11-26 NOTE — Plan of Care (Signed)
  Problem: Education: Goal: Knowledge of General Education information will improve Description: Including pain rating scale, medication(s)/side effects and non-pharmacologic comfort measures Outcome: Progressing   Problem: Clinical Measurements: Goal: Ability to maintain clinical measurements within normal limits will improve Outcome: Progressing Goal: Will remain free from infection Outcome: Progressing Goal: Diagnostic test results will improve Outcome: Progressing Goal: Cardiovascular complication will be avoided Outcome: Progressing   Problem: Nutrition: Goal: Adequate nutrition will be maintained Outcome: Progressing

## 2022-11-26 NOTE — Evaluation (Signed)
Occupational Therapy Evaluation Patient Details Name: Jeffery Fernandez MRN: [id number] DOB: [birth year] Today's Date: 11/26/2022   History of Present Illness 77 y.o. with a pertinent PMH of HTN ,OSA ,T2DM CKD III and Chronic back pain, who presented with SOB  and admitted for Acute hypoxic respiratory failure.   Clinical Impression   Patient admitted for the diagnosis above.  PTA he did not require O2, completed his own ADL, and walked without an AD.  Currently he is needing up to CGA for mobility and Min A for lower body ADL from a sit to stand level.  O2 sats and poor activity tolerance are the deficits.  Patient struggles with breathing techniques, and has a tendency to hold his breath during exertion.  OT will continue efforts in the acute setting to address deficits, and no post acute OT is anticipated given assist at home, but can be considered if the patient requests.         If plan is discharge home, recommend the following: Assist for transportation;Assistance with cooking/housework;A little help with bathing/dressing/bathroom;A little help with walking and/or transfers    Functional Status Assessment  Patient has had a recent decline in their functional status and demonstrates the ability to make significant improvements in function in a reasonable and predictable amount of time.  Equipment Recommendations  None recommended by OT    Recommendations for Other Services       Precautions / Restrictions Precautions Precautions: Fall Precaution Comments: O2 sats Restrictions Weight Bearing Restrictions: No      Mobility Bed Mobility Overal bed mobility: Needs Assistance Bed Mobility: Supine to Sit     Supine to sit: Supervision       Patient Response: Cooperative  Transfers Overall transfer level: Needs assistance   Transfers: Sit to/from Stand, Bed to chair/wheelchair/BSC Sit to Stand: Supervision, Contact guard assist     Step pivot transfers: Contact guard  assist            Balance Overall balance assessment: Needs assistance Sitting-balance support: Feet supported Sitting balance-Leahy Scale: Good     Standing balance support: No upper extremity supported Standing balance-Leahy Scale: Fair Standing balance comment: Described being weaker and is unsteady.                           ADL either performed or assessed with clinical judgement   ADL       Grooming: Wash/dry hands;Wash/dry face;Set up;Sitting           Upper Body Dressing : Modified independent;Sitting   Lower Body Dressing: Contact guard assist;Sit to/from stand   Toilet Transfer: Contact guard assist;BSC/3in1;Stand-pivot                   Vision Baseline Vision/History: 1 Wears glasses Patient Visual Report: No change from baseline       Perception Perception: Within Functional Limits       Praxis Praxis: WFL       Pertinent Vitals/Pain Pain Assessment Pain Assessment: No/denies pain     Extremity/Trunk Assessment Upper Extremity Assessment Upper Extremity Assessment: Generalized weakness   Lower Extremity Assessment Lower Extremity Assessment: Defer to PT evaluation   Cervical / Trunk Assessment Cervical / Trunk Assessment: Kyphotic   Communication Communication Communication: No apparent difficulties   Cognition Arousal: Alert Behavior During Therapy: WFL for tasks assessed/performed Overall Cognitive Status: Within Functional Limits for tasks assessed  General Comments   Varying O2 sats, poor pleath at times.      Exercises     Shoulder Instructions      Home Living Family/patient expects to be discharged to:: Private residence Living Arrangements: Spouse/significant other Available Help at Discharge: Family;Available 24 hours/day Type of Home: House Home Access: Ramped entrance     Home Layout: One level     Bathroom Shower/Tub: Walk-in  shower;Tub/shower unit   Bathroom Toilet: Handicapped height Bathroom Accessibility: Yes How Accessible: Accessible via walker Home Equipment: Rolling Walker (2 wheels);Cane - single point;Grab bars - toilet;Shower seat          Prior Functioning/Environment Prior Level of Function : Independent/Modified Independent;Driving             Mobility Comments: Household distances without AD, chronic back pain ADLs Comments: Spouse assists with iADL and yard work.  No assist with ADL        OT Problem List: Decreased activity tolerance;Impaired balance (sitting and/or standing);Decreased strength      OT Treatment/Interventions: Self-care/ADL training;Therapeutic activities;Balance training;Energy conservation;Patient/family education;DME and/or AE instruction    OT Goals(Current goals can be found in the care plan section) Acute Rehab OT Goals Patient Stated Goal: Return home OT Goal Formulation: With patient Time For Goal Achievement: 12/10/22 Potential to Achieve Goals: Good ADL Goals Pt Will Perform Grooming: with modified independence;standing;sitting Pt Will Perform Lower Body Dressing: with modified independence;sit to/from stand Pt Will Transfer to Toilet: with modified independence;ambulating;regular height toilet  OT Frequency: Min 1X/week    Co-evaluation              AM-PAC OT "6 Clicks" Daily Activity     Outcome Measure Help from another person eating meals?: None Help from another person taking care of personal grooming?: A Little Help from another person toileting, which includes using toliet, bedpan, or urinal?: A Little Help from another person bathing (including washing, rinsing, drying)?: A Little Help from another person to put on and taking off regular upper body clothing?: None Help from another person to put on and taking off regular lower body clothing?: A Little 6 Click Score: 20   End of Session Nurse Communication: Mobility  status  Activity Tolerance: Patient tolerated treatment well Patient left: in chair;with call bell/phone within reach;with family/visitor present  OT Visit Diagnosis: Unsteadiness on feet (R26.81);Muscle weakness (generalized) (M62.81)                Time: 2355-7322 OT Time Calculation (min): 25 min Charges:  OT General Charges $OT Visit: 1 Visit OT Evaluation $OT Eval Moderate Complexity: 1 Mod OT Treatments $Self Care/Home Management : 8-22 mins  11/26/2022  RP, OTR/L  Acute Rehabilitation Services  Office:  [phone]   Suzanna Obey 11/26/2022, 9:04 AM

## 2022-11-27 ENCOUNTER — Inpatient Hospital Stay (HOSPITAL_COMMUNITY): Payer: HMO

## 2022-11-27 DIAGNOSIS — I5031 Acute diastolic (congestive) heart failure: Secondary | ICD-10-CM | POA: Diagnosis not present

## 2022-11-27 DIAGNOSIS — N179 Acute kidney failure, unspecified: Secondary | ICD-10-CM

## 2022-11-27 DIAGNOSIS — G4733 Obstructive sleep apnea (adult) (pediatric): Secondary | ICD-10-CM

## 2022-11-27 DIAGNOSIS — J9601 Acute respiratory failure with hypoxia: Secondary | ICD-10-CM

## 2022-11-27 DIAGNOSIS — D649 Anemia, unspecified: Secondary | ICD-10-CM

## 2022-11-27 LAB — RENAL FUNCTION PANEL
Albumin: 2.5 g/dL — ABNORMAL LOW (ref 3.5–5.0)
Albumin: 2.5 g/dL — ABNORMAL LOW (ref 3.5–5.0)
Anion gap: 11 (ref 5–15)
Anion gap: 12 (ref 5–15)
BUN: 41 mg/dL — ABNORMAL HIGH (ref 8–23)
BUN: 42 mg/dL — ABNORMAL HIGH (ref 8–23)
CO2: 26 mmol/L (ref 22–32)
CO2: 27 mmol/L (ref 22–32)
Calcium: 8.5 mg/dL — ABNORMAL LOW (ref 8.9–10.3)
Calcium: 8.3 mg/dL — ABNORMAL LOW (ref 8.9–10.3)
Chloride: 102 mmol/L (ref 98–111)
Chloride: 98 mmol/L (ref 98–111)
Creatinine, Ser: 3.55 mg/dL — ABNORMAL HIGH (ref 0.61–1.24)
Creatinine, Ser: 3.74 mg/dL — ABNORMAL HIGH (ref 0.61–1.24)
GFR, Estimated: 17 mL/min — ABNORMAL LOW (ref >60)
GFR, Estimated: 16 mL/min — ABNORMAL LOW (ref >60)
Glucose, Bld: 117 mg/dL — ABNORMAL HIGH (ref 70–99)
Glucose, Bld: 185 mg/dL — ABNORMAL HIGH (ref 70–99)
Phosphorus: 5.9 mg/dL — ABNORMAL HIGH (ref 2.5–4.6)
Phosphorus: 4.5 mg/dL (ref 2.5–4.6)
Potassium: 3.9 mmol/L (ref 3.5–5.1)
Potassium: 3.8 mmol/L (ref 3.5–5.1)
Sodium: 139 mmol/L (ref 135–145)
Sodium: 137 mmol/L (ref 135–145)

## 2022-11-27 LAB — MAGNESIUM: Magnesium: 2.1 mg/dL (ref 1.7–2.4)

## 2022-11-27 LAB — CBC
HCT: 29.8 % — ABNORMAL LOW (ref 39.0–52.0)
Hemoglobin: 9.5 g/dL — ABNORMAL LOW (ref 13.0–17.0)
MCH: 29.4 pg (ref 26.0–34.0)
MCHC: 31.9 g/dL (ref 30.0–36.0)
MCV: 92.3 fL (ref 80.0–100.0)
Platelets: 230 10*3/uL (ref 150–400)
RBC: 3.23 MIL/uL — ABNORMAL LOW (ref 4.22–5.81)
RDW: 14.1 % (ref 11.5–15.5)
WBC: 10.4 10*3/uL (ref 4.0–10.5)
nRBC: 0 % (ref 0.0–0.2)

## 2022-11-27 LAB — GLUCOSE, CAPILLARY: Glucose-Capillary: 150 mg/dL — ABNORMAL HIGH (ref 70–99)

## 2022-11-27 MED ORDER — FUROSEMIDE 10 MG/ML IJ SOLN
80.0000 mg | Freq: Once | INTRAMUSCULAR | Status: AC
  Administered 2022-11-27: 80 mg via INTRAVENOUS
  Filled 2022-11-27: qty 8

## 2022-11-27 MED ORDER — ALBUMIN HUMAN 25 % IV SOLN
25.0000 g | Freq: Once | INTRAVENOUS | Status: AC
  Administered 2022-11-27: 25 g via INTRAVENOUS
  Filled 2022-11-27: qty 100

## 2022-11-27 NOTE — Plan of Care (Signed)
  Problem: Clinical Measurements: Goal: Will remain free from infection Outcome: Progressing Goal: Respiratory complications will improve Outcome: Progressing   Problem: Nutrition: Goal: Adequate nutrition will be maintained Outcome: Progressing   Problem: Coping: Goal: Level of anxiety will decrease Outcome: Progressing   

## 2022-11-27 NOTE — Progress Notes (Signed)
Mobility Specialist Progress Note:   11/27/22 0857  Orthostatic Lying   BP- Lying 97/61  Orthostatic Sitting  BP- Sitting (!) 85/53  Orthostatic Standing at 0 minutes  BP- Standing at 0 minutes (!) 62/42  Oxygen Therapy  O2 Device HFNC  Mobility  Activity Stood at bedside (STS + Orthostatics)  Level of Assistance Contact guard assist, steadying assist  Assistive Device Front wheel walker  Activity Response Tolerated fair  Mobility Referral Yes  $Mobility charge 1 Mobility  Mobility Specialist Start Time (ACUTE ONLY) [id number]  Mobility Specialist Stop Time (ACUTE ONLY) 0855  Mobility Specialist Time Calculation (min) (ACUTE ONLY) 13 min    Pre Mobility: 87 HR,  97/61 (70) BP,  93% SpO2 During Mobility: 85/53 (63) BP Post Mobility:  94 HR,  62/42 (50) BP,  96% SpO2  Pt received in chair, agreeable to mobility. Positive for orthostatic vitals, though denied any dizziness until seated again. Ambulation deferred. Pt left in chair with call bell and all needs met. Family present. RN notified.  Jeffery Fernandez Mobility Specialist Please contact via Special educational needs teacher or Rehab office at [phone]

## 2022-11-27 NOTE — Consult Note (Signed)
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 Ronalee Belts, hypertension, history of ASCVD, OSA on CPAP, hyperlipidemia, DM2, anemia who originally presented on 10/27 with AHRF and confusion.  He was found to be hypoxic initially requiring 15 L via high flow nasal cannula and down titrated to 4 L with lasix 40mg  IV>   Weights are down approximately 9 kg.  Despite effective diuresis he had persistent hypoxia and cardiology was consulted.  TTE during this admission with preserved LVEF and grade 1 diastolic dysfunction.  Today he had orthostasis when standing at the edge of the bed, verified by vitals.  He had been continued on his lisinopril from home but this has been discontinued today.  Recent ultrasound was without hydronephrosis or other structural abnormalities.  Historical urine analysis is benign.  Creatinine, Ser (mg/dL)  Date Value  36/64/4034 3.74 (H)  11/27/2022 3.55 (H)  11/26/2022 3.44 (H)  11/26/2022 3.27 (H)  11/25/2022 3.15 (H)  11/25/2022 3.21 (H)  11/25/2022 3.11 (H)  11/24/2022 3.01 (H)  11/24/2022 3.02 (H)  07/03/2022 2.65 (H)  ] I/Os: I/O last 3 completed shifts: In: 677 [P.O.:677] Out: 1600 [Urine:1600]   ROS NSAIDS: No exposure IV Contrast no exposure TMP/SMX no exposure Hypotension not present Balance of 12 systems is negative w/ exceptions as above  PMH  Past Medical History:  Diagnosis Date   Arrhythmia    Arthritis    Cataract    bilateral cataract extraction with intraoccular lens implants   Chest pain, unspecified    Chronic airway obstruction, not elsewhere classified    PT. DENIES HE HAS COPD   CKD (chronic kidney disease)    Colon polyps    Adenomatous Polyps 2007   Depression    Diabetes mellitus     Diverticulosis    Gout    Heart murmur    History of kidney stones    Hyperlipemia    Hypertension    Nephrolithiasis    Normocytic anemia 04/23/2017   Obstructive sleep apnea (adult) (pediatric)    Other and unspecified hyperlipidemia    Other left bundle branch block    Retinal vascular occlusion, unspecified    right   Shortness of breath    on exertion   Unspecified sleep apnea    Vision loss of right eye    White coat syndrome with diagnosis of hypertension 09/19/2017   PSH  Past Surgical History:  Procedure Laterality Date   ANTERIOR CERVICAL DECOMP/DISCECTOMY FUSION N/A 06/26/2016   Procedure: Re-Exploration of Cervical Wound;  Surgeon: Donalee Citrin, MD;  Location: [facility] OR;  Service: Neurosurgery;  Laterality: N/A;   ANTERIOR CERVICAL DECOMP/DISCECTOMY FUSION N/A 06/26/2016   Procedure: ANTERIOR CERVICAL DECOMPRESSION/DISCECTOMY FUSION, INTERBODY PROSTESIS, PLATE, CERVICAL THREE CERVICAL FOUR, CERVICAL FOUR CERVICAL FIVE CERVICAL SIX;  Surgeon: Tressie Stalker, MD;  Location: [facility] OR;  Service: Neurosurgery;  Laterality: N/A;   APPLICATION OF WOUND VAC  06/05/2020   Procedure: APPLICATION OF WOUND VAC;  Surgeon: Tressie Stalker, MD;  Location: [facility] OR;  Service: Neurosurgery;;   BACK SURGERY     CATARACT EXTRACTION Bilateral    KIDNEY STONE SURGERY     KYPHOPLASTY N/A 01/19/2018   Procedure: KYPHOPLASTY LUMBAR THREE;  Surgeon: Tressie Stalker, MD;  Location: [facility] OR;  Service: Neurosurgery;  Laterality: N/A;  KYPHOPLASTY LUMBAR  THREE   LUMBAR WOUND DEBRIDEMENT N/A 06/05/2020   Procedure: LUMBAR WOUND DEBRIDEMENT;  Surgeon: Tressie Stalker, MD;  Location: [facility] OR;  Service: Neurosurgery;  Laterality: N/A;   FH  Family History  Problem Relation Age of Onset   Heart attack Brother 75       heart attack and CHF/ also some form of EP ablation   Parkinson's disease Brother    Atrial fibrillation Brother    Heart disease Father    Stroke Father    Uterine cancer Mother     Arthritis/Rheumatoid Cousin    Diabetes Other    Colon cancer Neg Hx    Stomach cancer Neg Hx    Liver cancer Neg Hx    Rectal cancer Neg Hx    Esophageal cancer Neg Hx    SH  reports that he quit smoking about 36 years ago. His smoking use included cigarettes. He started smoking about 61 years ago. He has a 37.5 pack-year smoking history. He has never used smokeless tobacco. He reports that he does not currently use alcohol. He reports that he does not use drugs. Allergies  Allergies  Allergen Reactions   Penicillins Other (See Comments)    Possibly rash - not sure Has patient had a PCN reaction causing immediate rash, facial/tongue/throat swelling, SOB or lightheadedness with hypotension:unknown Has patient had a PCN reaction causing severe rash involving mucus membranes or skin necrosis: Unknown Has patient had a PCN reaction that required hospitalization: Unknown Has patient had a PCN reaction occurring within the last 10 years: No Childhood reaction If all of the above answers are "NO", then may proceed with Cephalosporin use.    Home medications Prior to Admission medications   Medication Sig Start Date End Date Taking? Authorizing Provider  acetaminophen (TYLENOL) 500 MG tablet Take 1,000 mg by mouth every 6 (six) hours as needed (for pain.).   Yes [provider]  amLODipine (NORVASC) 5 MG tablet Take 5 mg by mouth daily.   Yes [provider]  aspirin EC 81 MG tablet Take 81 mg by mouth daily. Swallow whole.   Yes [provider]  atorvastatin (LIPITOR) 40 MG tablet Take 40 mg by mouth at bedtime.   Yes [provider]  buPROPion (WELLBUTRIN XL) 150 MG 24 hr tablet Take 150 mg by mouth every morning.   Yes [provider]  carvedilol (COREG) 25 MG tablet TAKE 1 TABLET BY MOUTH 2 TIMES DAILY WITH A MEAL. Patient taking differently: Take 25 mg by mouth 2 (two) times daily with a meal. 12/02/18  Yes Chilton Si, MD  Dulaglutide  (TRULICITY) 1.5 MG/0.5ML SOAJ Inject 1.5 mg into the skin daily. Takes on Thursdays 10/01/22  Yes [provider]  gabapentin (NEURONTIN) 100 MG capsule Take 100 mg by mouth 3 (three) times daily. 100mg  in the morning and evening. 200mg  at night   Yes [provider]  HYDROcodone-acetaminophen (NORCO/VICODIN) 5-325 MG tablet Take 1 tablet by mouth daily as needed for moderate pain (pain score 4-6). 11/13/22 12/13/22 Yes [provider]  insulin glargine (LANTUS) 100 UNIT/ML injection Inject 40 Units into the skin daily.   Yes [provider]  lisinopril (ZESTRIL) 40 MG tablet TAKE 1 TABLET BY MOUTH EVERY DAY Patient taking differently: Take 40 mg by mouth daily. 10/01/19  Yes Chilton Si, MD  sertraline (ZOLOFT) 100 MG tablet Take 100 mg by mouth daily.   Yes [provider]  terazosin (HYTRIN) 5 MG capsule TAKE 1 CAPSULE  BY MOUTH EVERYDAY AT BEDTIME Patient taking differently: Take 5 mg by mouth at bedtime. 06/26/18  Yes Chilton Si, MD  Turmeric (QC TUMERIC COMPLEX) 500 MG CAPS Take 2,400 mg by mouth in the morning and at bedtime.   Yes [provider]    Current Medications Scheduled Meds:  aspirin EC  81 mg Oral Daily   atorvastatin  40 mg Oral QHS   buPROPion  150 mg Oral q morning   enoxaparin (LOVENOX) injection  30 mg Subcutaneous Q24H   feeding supplement (GLUCERNA SHAKE)  237 mL Oral TID BM   furosemide  80 mg Intravenous Once   gabapentin  100 mg Oral TID   melatonin  5 mg Oral QHS   sertraline  100 mg Oral Daily   Continuous Infusions:  albumin human     PRN Meds:.acetaminophen  CBC Recent Labs  Lab 11/24/22 1302 11/24/22 1830 11/25/22 0222 11/26/22 0241 11/27/22 0237  WBC 9.8   < > 8.9 10.4 10.4  NEUTROABS 7.2  --   --   --   --   HGB 10.4*   < > 9.8* 10.1* 9.5*  HCT 31.5*   < > 29.8* 31.1* 29.8*  MCV 93.5   < > 92.5 91.5 92.3  PLT 237   < > 235 237 230   < > = values in this interval not displayed.    Basic Metabolic Panel Recent Labs  Lab 11/25/22 0222 11/25/22 1230 11/25/22 2229 11/26/22 0241 11/26/22 1520 11/27/22 0237 11/27/22 1116  NA 137 140 138 138 138 139 137  K 4.0 4.8 3.8 3.9 4.1 3.9 3.8  CL 103 105 103 104 101 102 98  CO2 25 25 22 23 26 26 27   GLUCOSE 105* 147* 113* 119* 161* 117* 185*  BUN 24* 26* 32* 30* 37* 41* 42*  CREATININE 3.11* 3.21* 3.15* 3.27* 3.44* 3.55* 3.74*  CALCIUM 8.3* 8.6* 8.4* 8.5* 8.4* 8.5* 8.3*  PHOS  --  4.2 4.6 4.8* 5.4* 5.9* 4.5    Physical Exam  Blood pressure 121/70, pulse 85, temperature 98.2 F (36.8 C), temperature source Oral, resp. rate 19, height 5\' 9"  (1.753 m), weight 84.5 kg, SpO2 92%. GEN: Sitting at edge of bed, elderly male, NAD ENT: NCAT EYES: EOMI CV: Regular, no murmur, no rub PULM: Diminished in the bases but otherwise clear throughout ABD: Soft, nontender SKIN: No rashes or lesions EXT: No significant lower extremity edema  Assessment 21M with AHRF partially improved, progressive CKD with nephrotic proteinuria.  AoCKD4 He has diabetic kidney disease followed by Dr. 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 Anemia, mild, stable  Plan It seems like he is still wet from a pulmonary perspective, will give a single dose of Lasix 80 mg IV with albumin for colloid/intravascular support reassess before another dose is given Discontinue lisinopril, I would not restart it at this time Continue sodium restricted diet Repeat postvoid residual Daily weights, Daily Renal Panel, Strict I/Os, Avoid nephrotoxins (NSAIDs, judicious IV Contrast)  Will follow along closely    Jeffery Fernandez  [phone] pgr 11/27/2022, 4:53 PM

## 2022-11-27 NOTE — Progress Notes (Signed)
Physical Therapy Treatment Patient Details Name: Jeffery Fernandez MRN: [id number] DOB: 11-Jan-1946 Today's Date: 11/27/2022   History of Present Illness 77 y.o. presented with SOB and admitted for Acute hypoxic respiratory failure on 10/27. Pt with HFpEF. PMH of HTN ,OSA ,T2DM CKD III and Chronic back pain    PT Comments  Patient with limited mobility today due to orthostatic BP, though pt denying symptoms.  Still worked on LE strength with sit <> stand.  Wife in the room and supportive.  PT will follow up.    Orthostatic VS for the past 24 hrs (Last 3 readings):  BP- Lying BP- Sitting BP- Standing at 0 minutes Pulse- Standing at 0 minutes  11/27/22 1400 108/64 97/54 (!) 64/49 91       If plan is discharge home, recommend the following: A little help with walking and/or transfers;Help with stairs or ramp for entrance;A little help with bathing/dressing/bathroom;Assistance with cooking/housework   Can travel by private vehicle        Equipment Recommendations  None recommended by PT    Recommendations for Other Services       Precautions / Restrictions Precautions Precautions: Fall Precaution Comments: Watch SpO2 & BP     Mobility  Bed Mobility Overal bed mobility: Needs Assistance Bed Mobility: Supine to Sit, Sit to Supine     Supine to sit: Supervision Sit to supine: Supervision   General bed mobility comments: assist for lines, increased time/effort    Transfers Overall transfer level: Needs assistance Equipment used: Rolling walker (2 wheels) Transfers: Sit to/from Stand Sit to Stand: Contact guard assist           General transfer comment: stood for BP measurement and noted drop so just performed sit<>stand x 5    Ambulation/Gait                   Stairs             Wheelchair Mobility     Tilt Bed    Modified Rankin (Stroke Patients Only)       Balance Overall balance assessment: Needs assistance   Sitting balance-Leahy Scale:  Good     Standing balance support: Single extremity supported Standing balance-Leahy Scale: Poor Standing balance comment: UE support for standing                            Cognition Arousal: Alert Behavior During Therapy: WFL for tasks assessed/performed Overall Cognitive Status: Within Functional Limits for tasks assessed                                          Exercises General Exercises - Lower Extremity Ankle Circles/Pumps: AROM, 10 reps, Both, Supine Heel Slides: AROM, 5 reps, Both, Supine Other Exercises Other Exercises: sit<>stand x 5 with CGA    General Comments General comments (skin integrity, edema, etc.): on 4L O2 with SpO2 90's; BP drop in standing so performed sit<>stand at bedside.      Pertinent Vitals/Pain Pain Assessment Pain Assessment: No/denies pain    Home Living                          Prior Function            PT Goals (current goals can now be found in the  care plan section) Progress towards PT goals: Not progressing toward goals - comment    Frequency    Min 1X/week      PT Plan      Co-evaluation              AM-PAC PT "6 Clicks" Mobility   Outcome Measure  Help needed turning from your back to your side while in a flat bed without using bedrails?: None Help needed moving from lying on your back to sitting on the side of a flat bed without using bedrails?: A Little Help needed moving to and from a bed to a chair (including a wheelchair)?: A Little Help needed standing up from a chair using your arms (e.g., wheelchair or bedside chair)?: A Little Help needed to walk in hospital room?: Total Help needed climbing 3-5 steps with a railing? : Total 6 Click Score: 15    End of Session Equipment Utilized During Treatment: Gait belt;Oxygen Activity Tolerance: Treatment limited secondary to medical complications (Comment) (orthostatic BP) Patient left: in bed;with call bell/phone within  reach;with family/visitor present   PT Visit Diagnosis: Muscle weakness (generalized) (M62.81);Unsteadiness on feet (R26.81)     Time: 5956-3875 PT Time Calculation (min) (ACUTE ONLY): 17 min  Charges:    $Therapeutic Activity: 8-22 mins PT General Charges $$ ACUTE PT VISIT: 1 Visit                     Sheran Lawless, PT Acute Rehabilitation Services Office:[phone] 11/27/2022    Elray Mcgregor 11/27/2022, 3:06 PM

## 2022-11-27 NOTE — Consult Note (Addendum)
Cardiology Consultation   Patient ID: Jeffery Fernandez MRN: [id number]; DOB: Oct 08, 1945  Admit date: 11/24/2022 Date of Consult: 11/27/2022  PCP:  [name], MD   [facility] Providers Cardiologist:  Chilton Si, MD   {  Patient Profile:   Jeffery Fernandez is a 77 y.o. male with a hx of hypertension, left bundle branch block, retinal artery occlusion 2011, nonobstructive CAD noted on catheterization 2012, hyperlipidemia, OSA on CPAP, type 2 diabetes, CKD, anemia who is being seen 11/27/2022 for the evaluation of shortness of breath at the request of Dr. Ninetta Lights.  History of Present Illness:   Jeffery Fernandez was last seen by Dr. Duke Salvia in September 2020.  He has a history of medically managed CAD reported on cath in 2012.  He was reported to have normal right left filling pressures.  He had 50 to 60% mid RCA stenosis and normal EF.  Dr. Duke Salvia was primarily managing him for his hypertension which was uncontrolled at the time.  He did not tolerate multiple medications due to dizziness with hydralazine, fatigue with with both nebivolol and carvedilol however eventually was switched back to carvedilol.  Reported having increased facial swelling with amlodipine at high doses.  Also was a previous smoker however quit 30+ years ago.  Currently patient is being evaluated for acute hypoxic respiratory failure and worsening shortness of breath.  He states that since Sunday he has had increased shortness of breath with minimal ambulation.  His wife had checked his home oxygen and was reported to be in the high 60s to 73s.  Chest x-ray indicating interstitial edema.  He was initially on 15 L via high flow nasal cannula and treated with IV Lasix 40 mg daily but was increased to 80 for the past 2 days.  But today he was not given any IV Lasix with hypotension.  His oxygen has now been titrated down to 4 L via high flow.  Patient states that he does feel improved and close to baseline.  He  reports feeling short of breath since his back surgery years ago but now worsened since Sunday.  Right now, denies any significant shortness of breath, chest pain, orthopnea and feels improved.  Today PT attempted to walk him however he had significant orthostatic hypotension dropping to the 60s systolic after standing today so he still not truly ambulated.  But he denied any dizziness.  Chest x-ray indicating cardiomegaly with pulmonary edema.  Repeat chest x-ray pending.  BNP 125.5.  BUN and creatinine continued to decline.  BUN 42, creatinine 3.74.  GFR 16.   Past Medical History:  Diagnosis Date   Arrhythmia    Arthritis    Cataract    bilateral cataract extraction with intraoccular lens implants   Chest pain, unspecified    Chronic airway obstruction, not elsewhere classified    PT. DENIES HE HAS COPD   CKD (chronic kidney disease)    Colon polyps    Adenomatous Polyps 2007   Depression    Diabetes mellitus    Diverticulosis    Gout    Heart murmur    History of kidney stones    Hyperlipemia    Hypertension    Nephrolithiasis    Normocytic anemia 04/23/2017   Obstructive sleep apnea (adult) (pediatric)    Other and unspecified hyperlipidemia    Other left bundle branch block    Retinal vascular occlusion, unspecified    right   Shortness of breath    on exertion  14.0 14.1  PLT 235 237 230   Thyroid  Recent Labs  Lab 11/24/22 1302  TSH 3.366    BNP Recent Labs  Lab 11/24/22 1302  BNP 125.5*    DDimer No results for input(s): "DDIMER" in the last 168 hours.   Radiology/Studies:  ECHOCARDIOGRAM COMPLETE  Result Date: 11/25/2022    ECHOCARDIOGRAM REPORT   Patient Name:   Jeffery Fernandez Date of Exam: 11/25/2022 Medical Rec #:  [id number]     Height:       69.0 in Accession #:    [id number]    Weight:       192.9 lb Date of Birth:  21-Mar-1945      BSA:          2.035 m Patient Age:    77 years      BP:           127/84 mmHg Patient Gender: M             HR:           85 bpm. Exam Location:  Inpatient Procedure: 2D Echo, Cardiac Doppler, Color Doppler and Intracardiac            Opacification Agent Indications:    Dyspnea  History:        Patient has prior history of Echocardiogram examinations, most                 recent 06/20/2010. Arrythmias:LBBB.  Sonographer:    Karma Ganja Referring Phys: [id number] [facility]  Sonographer Comments: Technically difficult study due to poor echo windows. Image acquisition challenging due to COPD. IMPRESSIONS  1. Technically difficult study.  2. Left ventricular ejection fraction, by estimation, is 55 to 60%. The left ventricle has normal function. The left ventricle has no regional wall motion  abnormalities. There is mild concentric left ventricular hypertrophy. Left ventricular diastolic parameters are consistent with Grade I diastolic dysfunction (impaired relaxation).  3. Right ventricular systolic function is normal. The right ventricular size is normal.  4. The mitral valve is grossly normal. No evidence of mitral valve regurgitation. No evidence of mitral stenosis.  5. The aortic valve is normal in structure. Aortic valve regurgitation is not visualized. No aortic stenosis is present.  6. The inferior vena cava is normal in size with greater than 50% respiratory variability, suggesting right atrial pressure of 3 mmHg. FINDINGS  Left Ventricle: Left ventricular ejection fraction, by estimation, is 55 to 60%. The left ventricle has normal function. The left ventricle has no regional wall motion abnormalities. Definity contrast agent was given IV to delineate the left ventricular  endocardial borders. The left ventricular internal cavity size was normal in size. There is mild concentric left ventricular hypertrophy. Abnormal (paradoxical) septal motion, consistent with left bundle branch block. Left ventricular diastolic parameters are consistent with Grade I diastolic dysfunction (impaired relaxation). Right Ventricle: The right ventricular size is normal. No increase in right ventricular wall thickness. Right ventricular systolic function is normal. Left Atrium: Left atrial size was normal in size. Right Atrium: Right atrial size was normal in size. Pericardium: There is no evidence of pericardial effusion. Mitral Valve: The mitral valve is grossly normal. No evidence of mitral valve regurgitation. No evidence of mitral valve stenosis. Tricuspid Valve: The tricuspid valve is grossly normal. Tricuspid valve regurgitation is not demonstrated. No evidence of tricuspid stenosis. Aortic Valve: The aortic valve is normal in structure. Aortic valve regurgitation is not visualized. No aortic stenosis  Cardiology Consultation   Patient ID: Jeffery Fernandez MRN: [id number]; DOB: Oct 08, 1945  Admit date: 11/24/2022 Date of Consult: 11/27/2022  PCP:  [name], MD   [facility] Providers Cardiologist:  Chilton Si, MD   {  Patient Profile:   Jeffery Fernandez is a 77 y.o. male with a hx of hypertension, left bundle branch block, retinal artery occlusion 2011, nonobstructive CAD noted on catheterization 2012, hyperlipidemia, OSA on CPAP, type 2 diabetes, CKD, anemia who is being seen 11/27/2022 for the evaluation of shortness of breath at the request of Dr. Ninetta Lights.  History of Present Illness:   Jeffery Fernandez was last seen by Dr. Duke Salvia in September 2020.  He has a history of medically managed CAD reported on cath in 2012.  He was reported to have normal right left filling pressures.  He had 50 to 60% mid RCA stenosis and normal EF.  Dr. Duke Salvia was primarily managing him for his hypertension which was uncontrolled at the time.  He did not tolerate multiple medications due to dizziness with hydralazine, fatigue with with both nebivolol and carvedilol however eventually was switched back to carvedilol.  Reported having increased facial swelling with amlodipine at high doses.  Also was a previous smoker however quit 30+ years ago.  Currently patient is being evaluated for acute hypoxic respiratory failure and worsening shortness of breath.  He states that since Sunday he has had increased shortness of breath with minimal ambulation.  His wife had checked his home oxygen and was reported to be in the high 60s to 73s.  Chest x-ray indicating interstitial edema.  He was initially on 15 L via high flow nasal cannula and treated with IV Lasix 40 mg daily but was increased to 80 for the past 2 days.  But today he was not given any IV Lasix with hypotension.  His oxygen has now been titrated down to 4 L via high flow.  Patient states that he does feel improved and close to baseline.  He  reports feeling short of breath since his back surgery years ago but now worsened since Sunday.  Right now, denies any significant shortness of breath, chest pain, orthopnea and feels improved.  Today PT attempted to walk him however he had significant orthostatic hypotension dropping to the 60s systolic after standing today so he still not truly ambulated.  But he denied any dizziness.  Chest x-ray indicating cardiomegaly with pulmonary edema.  Repeat chest x-ray pending.  BNP 125.5.  BUN and creatinine continued to decline.  BUN 42, creatinine 3.74.  GFR 16.   Past Medical History:  Diagnosis Date   Arrhythmia    Arthritis    Cataract    bilateral cataract extraction with intraoccular lens implants   Chest pain, unspecified    Chronic airway obstruction, not elsewhere classified    PT. DENIES HE HAS COPD   CKD (chronic kidney disease)    Colon polyps    Adenomatous Polyps 2007   Depression    Diabetes mellitus    Diverticulosis    Gout    Heart murmur    History of kidney stones    Hyperlipemia    Hypertension    Nephrolithiasis    Normocytic anemia 04/23/2017   Obstructive sleep apnea (adult) (pediatric)    Other and unspecified hyperlipidemia    Other left bundle branch block    Retinal vascular occlusion, unspecified    right   Shortness of breath    on exertion  Cardiology Consultation   Patient ID: Jeffery Fernandez MRN: [id number]; DOB: Oct 08, 1945  Admit date: 11/24/2022 Date of Consult: 11/27/2022  PCP:  [name], MD   [facility] Providers Cardiologist:  Chilton Si, MD   {  Patient Profile:   Jeffery Fernandez is a 77 y.o. male with a hx of hypertension, left bundle branch block, retinal artery occlusion 2011, nonobstructive CAD noted on catheterization 2012, hyperlipidemia, OSA on CPAP, type 2 diabetes, CKD, anemia who is being seen 11/27/2022 for the evaluation of shortness of breath at the request of Dr. Ninetta Lights.  History of Present Illness:   Jeffery Fernandez was last seen by Dr. Duke Salvia in September 2020.  He has a history of medically managed CAD reported on cath in 2012.  He was reported to have normal right left filling pressures.  He had 50 to 60% mid RCA stenosis and normal EF.  Dr. Duke Salvia was primarily managing him for his hypertension which was uncontrolled at the time.  He did not tolerate multiple medications due to dizziness with hydralazine, fatigue with with both nebivolol and carvedilol however eventually was switched back to carvedilol.  Reported having increased facial swelling with amlodipine at high doses.  Also was a previous smoker however quit 30+ years ago.  Currently patient is being evaluated for acute hypoxic respiratory failure and worsening shortness of breath.  He states that since Sunday he has had increased shortness of breath with minimal ambulation.  His wife had checked his home oxygen and was reported to be in the high 60s to 73s.  Chest x-ray indicating interstitial edema.  He was initially on 15 L via high flow nasal cannula and treated with IV Lasix 40 mg daily but was increased to 80 for the past 2 days.  But today he was not given any IV Lasix with hypotension.  His oxygen has now been titrated down to 4 L via high flow.  Patient states that he does feel improved and close to baseline.  He  reports feeling short of breath since his back surgery years ago but now worsened since Sunday.  Right now, denies any significant shortness of breath, chest pain, orthopnea and feels improved.  Today PT attempted to walk him however he had significant orthostatic hypotension dropping to the 60s systolic after standing today so he still not truly ambulated.  But he denied any dizziness.  Chest x-ray indicating cardiomegaly with pulmonary edema.  Repeat chest x-ray pending.  BNP 125.5.  BUN and creatinine continued to decline.  BUN 42, creatinine 3.74.  GFR 16.   Past Medical History:  Diagnosis Date   Arrhythmia    Arthritis    Cataract    bilateral cataract extraction with intraoccular lens implants   Chest pain, unspecified    Chronic airway obstruction, not elsewhere classified    PT. DENIES HE HAS COPD   CKD (chronic kidney disease)    Colon polyps    Adenomatous Polyps 2007   Depression    Diabetes mellitus    Diverticulosis    Gout    Heart murmur    History of kidney stones    Hyperlipemia    Hypertension    Nephrolithiasis    Normocytic anemia 04/23/2017   Obstructive sleep apnea (adult) (pediatric)    Other and unspecified hyperlipidemia    Other left bundle branch block    Retinal vascular occlusion, unspecified    right   Shortness of breath    on exertion  Cardiology Consultation   Patient ID: Jeffery Fernandez MRN: [id number]; DOB: Oct 08, 1945  Admit date: 11/24/2022 Date of Consult: 11/27/2022  PCP:  [name], MD   [facility] Providers Cardiologist:  Chilton Si, MD   {  Patient Profile:   Jeffery Fernandez is a 77 y.o. male with a hx of hypertension, left bundle branch block, retinal artery occlusion 2011, nonobstructive CAD noted on catheterization 2012, hyperlipidemia, OSA on CPAP, type 2 diabetes, CKD, anemia who is being seen 11/27/2022 for the evaluation of shortness of breath at the request of Dr. Ninetta Lights.  History of Present Illness:   Jeffery Fernandez was last seen by Dr. Duke Salvia in September 2020.  He has a history of medically managed CAD reported on cath in 2012.  He was reported to have normal right left filling pressures.  He had 50 to 60% mid RCA stenosis and normal EF.  Dr. Duke Salvia was primarily managing him for his hypertension which was uncontrolled at the time.  He did not tolerate multiple medications due to dizziness with hydralazine, fatigue with with both nebivolol and carvedilol however eventually was switched back to carvedilol.  Reported having increased facial swelling with amlodipine at high doses.  Also was a previous smoker however quit 30+ years ago.  Currently patient is being evaluated for acute hypoxic respiratory failure and worsening shortness of breath.  He states that since Sunday he has had increased shortness of breath with minimal ambulation.  His wife had checked his home oxygen and was reported to be in the high 60s to 73s.  Chest x-ray indicating interstitial edema.  He was initially on 15 L via high flow nasal cannula and treated with IV Lasix 40 mg daily but was increased to 80 for the past 2 days.  But today he was not given any IV Lasix with hypotension.  His oxygen has now been titrated down to 4 L via high flow.  Patient states that he does feel improved and close to baseline.  He  reports feeling short of breath since his back surgery years ago but now worsened since Sunday.  Right now, denies any significant shortness of breath, chest pain, orthopnea and feels improved.  Today PT attempted to walk him however he had significant orthostatic hypotension dropping to the 60s systolic after standing today so he still not truly ambulated.  But he denied any dizziness.  Chest x-ray indicating cardiomegaly with pulmonary edema.  Repeat chest x-ray pending.  BNP 125.5.  BUN and creatinine continued to decline.  BUN 42, creatinine 3.74.  GFR 16.   Past Medical History:  Diagnosis Date   Arrhythmia    Arthritis    Cataract    bilateral cataract extraction with intraoccular lens implants   Chest pain, unspecified    Chronic airway obstruction, not elsewhere classified    PT. DENIES HE HAS COPD   CKD (chronic kidney disease)    Colon polyps    Adenomatous Polyps 2007   Depression    Diabetes mellitus    Diverticulosis    Gout    Heart murmur    History of kidney stones    Hyperlipemia    Hypertension    Nephrolithiasis    Normocytic anemia 04/23/2017   Obstructive sleep apnea (adult) (pediatric)    Other and unspecified hyperlipidemia    Other left bundle branch block    Retinal vascular occlusion, unspecified    right   Shortness of breath    on exertion  14.0 14.1  PLT 235 237 230   Thyroid  Recent Labs  Lab 11/24/22 1302  TSH 3.366    BNP Recent Labs  Lab 11/24/22 1302  BNP 125.5*    DDimer No results for input(s): "DDIMER" in the last 168 hours.   Radiology/Studies:  ECHOCARDIOGRAM COMPLETE  Result Date: 11/25/2022    ECHOCARDIOGRAM REPORT   Patient Name:   Jeffery Fernandez Date of Exam: 11/25/2022 Medical Rec #:  [id number]     Height:       69.0 in Accession #:    [id number]    Weight:       192.9 lb Date of Birth:  21-Mar-1945      BSA:          2.035 m Patient Age:    77 years      BP:           127/84 mmHg Patient Gender: M             HR:           85 bpm. Exam Location:  Inpatient Procedure: 2D Echo, Cardiac Doppler, Color Doppler and Intracardiac            Opacification Agent Indications:    Dyspnea  History:        Patient has prior history of Echocardiogram examinations, most                 recent 06/20/2010. Arrythmias:LBBB.  Sonographer:    Karma Ganja Referring Phys: [id number] [facility]  Sonographer Comments: Technically difficult study due to poor echo windows. Image acquisition challenging due to COPD. IMPRESSIONS  1. Technically difficult study.  2. Left ventricular ejection fraction, by estimation, is 55 to 60%. The left ventricle has normal function. The left ventricle has no regional wall motion  abnormalities. There is mild concentric left ventricular hypertrophy. Left ventricular diastolic parameters are consistent with Grade I diastolic dysfunction (impaired relaxation).  3. Right ventricular systolic function is normal. The right ventricular size is normal.  4. The mitral valve is grossly normal. No evidence of mitral valve regurgitation. No evidence of mitral stenosis.  5. The aortic valve is normal in structure. Aortic valve regurgitation is not visualized. No aortic stenosis is present.  6. The inferior vena cava is normal in size with greater than 50% respiratory variability, suggesting right atrial pressure of 3 mmHg. FINDINGS  Left Ventricle: Left ventricular ejection fraction, by estimation, is 55 to 60%. The left ventricle has normal function. The left ventricle has no regional wall motion abnormalities. Definity contrast agent was given IV to delineate the left ventricular  endocardial borders. The left ventricular internal cavity size was normal in size. There is mild concentric left ventricular hypertrophy. Abnormal (paradoxical) septal motion, consistent with left bundle branch block. Left ventricular diastolic parameters are consistent with Grade I diastolic dysfunction (impaired relaxation). Right Ventricle: The right ventricular size is normal. No increase in right ventricular wall thickness. Right ventricular systolic function is normal. Left Atrium: Left atrial size was normal in size. Right Atrium: Right atrial size was normal in size. Pericardium: There is no evidence of pericardial effusion. Mitral Valve: The mitral valve is grossly normal. No evidence of mitral valve regurgitation. No evidence of mitral valve stenosis. Tricuspid Valve: The tricuspid valve is grossly normal. Tricuspid valve regurgitation is not demonstrated. No evidence of tricuspid stenosis. Aortic Valve: The aortic valve is normal in structure. Aortic valve regurgitation is not visualized. No aortic stenosis  Cardiology Consultation   Patient ID: Jeffery Fernandez MRN: [id number]; DOB: Oct 08, 1945  Admit date: 11/24/2022 Date of Consult: 11/27/2022  PCP:  [name], MD   [facility] Providers Cardiologist:  Chilton Si, MD   {  Patient Profile:   Jeffery Fernandez is a 77 y.o. male with a hx of hypertension, left bundle branch block, retinal artery occlusion 2011, nonobstructive CAD noted on catheterization 2012, hyperlipidemia, OSA on CPAP, type 2 diabetes, CKD, anemia who is being seen 11/27/2022 for the evaluation of shortness of breath at the request of Dr. Ninetta Lights.  History of Present Illness:   Jeffery Fernandez was last seen by Dr. Duke Salvia in September 2020.  He has a history of medically managed CAD reported on cath in 2012.  He was reported to have normal right left filling pressures.  He had 50 to 60% mid RCA stenosis and normal EF.  Dr. Duke Salvia was primarily managing him for his hypertension which was uncontrolled at the time.  He did not tolerate multiple medications due to dizziness with hydralazine, fatigue with with both nebivolol and carvedilol however eventually was switched back to carvedilol.  Reported having increased facial swelling with amlodipine at high doses.  Also was a previous smoker however quit 30+ years ago.  Currently patient is being evaluated for acute hypoxic respiratory failure and worsening shortness of breath.  He states that since Sunday he has had increased shortness of breath with minimal ambulation.  His wife had checked his home oxygen and was reported to be in the high 60s to 73s.  Chest x-ray indicating interstitial edema.  He was initially on 15 L via high flow nasal cannula and treated with IV Lasix 40 mg daily but was increased to 80 for the past 2 days.  But today he was not given any IV Lasix with hypotension.  His oxygen has now been titrated down to 4 L via high flow.  Patient states that he does feel improved and close to baseline.  He  reports feeling short of breath since his back surgery years ago but now worsened since Sunday.  Right now, denies any significant shortness of breath, chest pain, orthopnea and feels improved.  Today PT attempted to walk him however he had significant orthostatic hypotension dropping to the 60s systolic after standing today so he still not truly ambulated.  But he denied any dizziness.  Chest x-ray indicating cardiomegaly with pulmonary edema.  Repeat chest x-ray pending.  BNP 125.5.  BUN and creatinine continued to decline.  BUN 42, creatinine 3.74.  GFR 16.   Past Medical History:  Diagnosis Date   Arrhythmia    Arthritis    Cataract    bilateral cataract extraction with intraoccular lens implants   Chest pain, unspecified    Chronic airway obstruction, not elsewhere classified    PT. DENIES HE HAS COPD   CKD (chronic kidney disease)    Colon polyps    Adenomatous Polyps 2007   Depression    Diabetes mellitus    Diverticulosis    Gout    Heart murmur    History of kidney stones    Hyperlipemia    Hypertension    Nephrolithiasis    Normocytic anemia 04/23/2017   Obstructive sleep apnea (adult) (pediatric)    Other and unspecified hyperlipidemia    Other left bundle branch block    Retinal vascular occlusion, unspecified    right   Shortness of breath    on exertion  14.0 14.1  PLT 235 237 230   Thyroid  Recent Labs  Lab 11/24/22 1302  TSH 3.366    BNP Recent Labs  Lab 11/24/22 1302  BNP 125.5*    DDimer No results for input(s): "DDIMER" in the last 168 hours.   Radiology/Studies:  ECHOCARDIOGRAM COMPLETE  Result Date: 11/25/2022    ECHOCARDIOGRAM REPORT   Patient Name:   Jeffery Fernandez Date of Exam: 11/25/2022 Medical Rec #:  [id number]     Height:       69.0 in Accession #:    [id number]    Weight:       192.9 lb Date of Birth:  21-Mar-1945      BSA:          2.035 m Patient Age:    77 years      BP:           127/84 mmHg Patient Gender: M             HR:           85 bpm. Exam Location:  Inpatient Procedure: 2D Echo, Cardiac Doppler, Color Doppler and Intracardiac            Opacification Agent Indications:    Dyspnea  History:        Patient has prior history of Echocardiogram examinations, most                 recent 06/20/2010. Arrythmias:LBBB.  Sonographer:    Karma Ganja Referring Phys: [id number] [facility]  Sonographer Comments: Technically difficult study due to poor echo windows. Image acquisition challenging due to COPD. IMPRESSIONS  1. Technically difficult study.  2. Left ventricular ejection fraction, by estimation, is 55 to 60%. The left ventricle has normal function. The left ventricle has no regional wall motion  abnormalities. There is mild concentric left ventricular hypertrophy. Left ventricular diastolic parameters are consistent with Grade I diastolic dysfunction (impaired relaxation).  3. Right ventricular systolic function is normal. The right ventricular size is normal.  4. The mitral valve is grossly normal. No evidence of mitral valve regurgitation. No evidence of mitral stenosis.  5. The aortic valve is normal in structure. Aortic valve regurgitation is not visualized. No aortic stenosis is present.  6. The inferior vena cava is normal in size with greater than 50% respiratory variability, suggesting right atrial pressure of 3 mmHg. FINDINGS  Left Ventricle: Left ventricular ejection fraction, by estimation, is 55 to 60%. The left ventricle has normal function. The left ventricle has no regional wall motion abnormalities. Definity contrast agent was given IV to delineate the left ventricular  endocardial borders. The left ventricular internal cavity size was normal in size. There is mild concentric left ventricular hypertrophy. Abnormal (paradoxical) septal motion, consistent with left bundle branch block. Left ventricular diastolic parameters are consistent with Grade I diastolic dysfunction (impaired relaxation). Right Ventricle: The right ventricular size is normal. No increase in right ventricular wall thickness. Right ventricular systolic function is normal. Left Atrium: Left atrial size was normal in size. Right Atrium: Right atrial size was normal in size. Pericardium: There is no evidence of pericardial effusion. Mitral Valve: The mitral valve is grossly normal. No evidence of mitral valve regurgitation. No evidence of mitral valve stenosis. Tricuspid Valve: The tricuspid valve is grossly normal. Tricuspid valve regurgitation is not demonstrated. No evidence of tricuspid stenosis. Aortic Valve: The aortic valve is normal in structure. Aortic valve regurgitation is not visualized. No aortic stenosis

## 2022-11-27 NOTE — Progress Notes (Addendum)
HD#2 SUBJECTIVE:  Patient Summary: Jeffery Fernandez is a 77 y.o. with a pertinent PMH of HTN ,OSA ,T2DM CKD III and Chronic back pain, who presented with SOB  and admitted for Acute hypoxic respiratory failure found to have HFpEF  Overnight Events:  Transitioned from 13L to 4L oxygen requirement sating at 92 %   Interim History: Patient was sitting in bed comfortably in bed, says he feels better today and slept well. Daughter at bedside had questions regarding the patient's care that were adequately addressed.   OBJECTIVE:  Vital Signs: Vitals:   11/26/22 2153 11/26/22 2259 11/27/22 0526 11/27/22 0716  BP:  (!) 125/108 (!) 110/51 (!) 109/50  Pulse: 78 85    Resp: 20 19    Temp:  98.3 F (36.8 C) 98.2 F (36.8 C) 98.4 F (36.9 C)  TempSrc:  Oral Oral Oral  SpO2: 96% 95% 92% 92%  Weight:   84.5 kg   Height:       Supplemental O2: HFNC SpO2: 92 % O2 Flow Rate (L/min): 4 L/min  Filed Weights   11/25/22 0420 11/26/22 0406 11/27/22 0526  Weight: 87.5 kg 84.9 kg 84.5 kg     Intake/Output Summary (Last 24 hours) at 11/27/2022 0951 Last data filed at 11/27/2022 0600 Gross per 24 hour  Intake 477 ml  Output 600 ml  Net -123 ml   Net IO Since Admission: -2,903 mL [11/27/22 0951]  Physical Exam: Physical Exam Constitutional:      Comments: Laying in bed in no acute distress  Cardiovascular:     Rate and Rhythm: Normal rate.     Comments: No S3 appreciated. Normal heart sounds  Pulmonary:     Effort: Tachypnea present.     Comments:  crackles  at Lower lobes bilaterally IMPROVED  On 4L HFCN sating at 95 Abdominal:     Palpations: Abdomen is soft.  Musculoskeletal:     Right lower leg: No edema.     Left lower leg: No edema.  Skin:    General: Skin is warm and dry.  Neurological:     General: No focal deficit present.     Mental Status: He is alert.  Psychiatric:        Mood and Affect: Mood normal.        Behavior: Behavior normal.     Patient  Lines/Drains/Airways Status     Active Line/Drains/Airways     Name Placement date Placement time Site Days   Peripheral IV 11/24/22 20 G Left Antecubital 11/24/22  1343  Antecubital  1             ASSESSMENT/PLAN:  Assessment: Principal Problem:   Acute hypoxic respiratory failure (HCC) Active Problems:   Acute heart failure with preserved ejection fraction (HCC)   Plan: #Acute Hypoxic Respiratory Failure  2/2 HFpEF excerebration  On 4L sating at 95% this morning. Significantly reduced oxygen requirement from yesterday. Pt had positive Orthostatic vitals but denied any dizziness . Will consider holding off Lasix today  - Hold Lasix  - Reassess RFP and Oxygen requirement today an - Wean down Oxygen requirement as tolerated  - continue Daily weights - Continue PT/OT  #History Of Anemia Patient has a hx of anemia likely in the setting on chronic kidney disease.I wonder is his anemia is contributing to his dyspnea. In 2021 his iron studies showed iron saturation of 18% and ferritin of 122 - Order iron studies   #Hx Chronic Kidney Disease IV Hx of  CKD IV. - Avoid Nephrotoxic agents  - Noon RFP  #Hx of OSA - continue CPAP at night as needed  #Hx of BPH - Continue home Terazosin 5 mg  #Hx of HTN #Hx of HLD - Continue Amlodipine and monitor vitals  - Continue home Lipitor 40 mg  - Continue ASA 81 mg  #Hx of Depression - Continue home Sertraline  - Continue Bupropion 150 mg  #Hx of chronic low back pain - Tylenol 500 mg as needed   Best Practice: Diet: Renal diet VTE: enoxaparin (LOVENOX) injection 30 mg Start: 11/25/22 1000 Code: DNR Family Contact: Wife, at bedside. DISPO: Anticipated discharge in 2 days to Home pending New oxygen.  Signature: Kathleen Lime , MD Internal Medicine Resident, PGY-1 [facility]  Pager: [phone] 9:51 AM, 11/27/2022   Please contact the on call pager after 5 pm and on weekends at  [phone].

## 2022-11-27 NOTE — TOC Initial Note (Signed)
Transition of Care (TOC) - Initial/Assessment Note   Spoke to patient and daughter at bedside.   Patient fro  home with wife.   No preference on home health agency.   [facility] with [facility] can accept referral HHPT. WIll need orders.   Patient currently down to 4 L [place] oxygen. Does not have oxygen at home . TOC will continue to follow.  Patient Details  Name: Jeffery Fernandez MRN: [id number] Date of Birth: [birth year]  Transition of Care [facility]) CM/SW Contact:    Kingsley Plan, RN Phone Number: 11/27/2022, 11:30 AM  Clinical Narrative:                   Expected Discharge Plan: Home w Home Health Services Barriers to Discharge: Continued Medical Work up   Patient Goals and CMS Choice Patient states their goals for this hospitalization and ongoing recovery are:: to return to home CMS Medicare.gov Compare Post Acute Care list provided to:: Patient Choice offered to / list presented to : Patient [place] ownership interest in [facility].provided to:: Patient    Expected Discharge Plan and Services   Discharge Planning Services: CM Consult Post Acute Care Choice: Home Health Living arrangements for the past 2 months: Single Family Home                 DME Arranged:  (following possible oxygen ?)         HH Arranged: PT HH Agency: [facility] Date [facility] Agency Contacted: 11/27/22 Time HH Agency Contacted: 1126 Representative spoke with at [facility] Agency: Kandee Keen with [facility] , left message awaiting call back  Prior Living Arrangements/Services Living arrangements for the past 2 months: Single Family Home Lives with:: Spouse Patient language and need for interpreter reviewed:: Yes Do you feel safe going back to the place where you live?: Yes      Need for Family Participation in Patient Care: Yes (Comment) Care giver support system in place?: Yes (comment) Current home services: DME Criminal Activity/Legal Involvement Pertinent to Current  Situation/Hospitalization: No - Comment as needed  Activities of Daily Living   ADL Screening (condition at time of admission) Independently performs ADLs?: Yes (appropriate for developmental age) Is the patient deaf or have difficulty hearing?: Yes Does the patient have difficulty seeing, even when wearing glasses/contacts?: Yes Does the patient have difficulty concentrating, remembering, or making decisions?: Yes  Permission Sought/Granted   Permission granted to share information with : Yes, Verbal Permission Granted  Share Information with NAME: [name] [phone]  Permission granted to share info w AGENCY: home health agencies        Emotional Assessment Appearance:: Appears stated age Attitude/Demeanor/Rapport: Engaged Affect (typically observed): Accepting Orientation: : Oriented to  Time, Oriented to Situation, Oriented to Place, Oriented to Self Alcohol / Substance Use: Not Applicable Psych Involvement: No (comment)  Admission diagnosis:  Hypoxia [R09.02] Acute hypoxic respiratory failure (HCC) [J96.01] Patient Active Problem List   Diagnosis Date Noted   Acute heart failure with preserved ejection fraction (HCC) 11/26/2022   Acute hypoxic respiratory failure (HCC) 11/24/2022   Pain due to implanted hardware, sequela 08/26/2022   Fusion hardware in spine 08/22/2022   Elevated sed rate 07/17/2022   Decreased calculated GFR 07/17/2022   Compression fracture of L5 lumbar vertebra, sequela 07/17/2022   History of vertebroplasty (L3, L4, L5) 07/17/2022   History of lumbar spinal fusion (posterior L4-L5) 07/17/2022   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   DDD (  degenerative disc disease), lumbosacral 07/17/2022   Lumbosacral facet arthropathy (L4-5, L5-S1) vs 07/17/2022   Lumbar facet joint pain 07/17/2022   Spondylosis without myelopathy or radiculopathy, lumbosacral region 07/17/2022   Wedge compression fracture of third lumbar vertebra, sequela  07/17/2022   Decreased range of motion of lumbar spine 07/17/2022   History of nephrolithiasis 07/17/2022   Spinal column pain 07/16/2022   Chronic pain syndrome 07/03/2022   Pharmacologic therapy 07/03/2022   Disorder of skeletal system 07/03/2022   Problems influencing health status 07/03/2022   Abnormal MRI, lumbar spine (04/29/2020 & 08/02/2022) 07/03/2022   Compression fracture of L4 lumbar vertebra, sequela 07/03/2022   Compression fracture of L3 lumbar vertebra, sequela 07/03/2022   History of vertebral fracture (L3, L4, L5) 07/03/2022   Spondylitis (HCC) 07/02/2022   Chronic low back pain (1ry area of Pain) (Bilateral) (L>R) w/o sciatica 07/02/2022   Inflamed seborrheic keratosis 07/02/2022   Bilateral sacroiliitis (HCC) 07/02/2022   Delayed wound healing 07/02/2022   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) 04/16/2021   Optic disc atrophy, right 02/05/2021   Chronic kidney disease (CKD), stage IV (severe) (HCC) 01/10/2021   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   Neck pain 10/08/2020   Type 2 diabetes mellitus, with long-term current use of insulin (HCC) 07/12/2020   Dyslipidemia associated with type 2 diabetes mellitus (HCC) 07/12/2020   Major depressive disorder with single episode, in full remission (HCC) 07/12/2020   History of gout 07/12/2020   Postoperative wound infection 06/06/2020   Delayed surgical wound healing 05/30/2020   Spinal stenosis of lumbar region 05/09/2020   Branch retinal vein occlusion with macular edema of left eye 05/05/2019   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   Bunion, left 01/15/2019   White coat syndrome with diagnosis of hypertension 09/19/2017   Normocytic anemia 04/23/2017   Heme positive stool 04/23/2017   Cervical spondylosis with myelopathy and radiculopathy 06/26/2016   Spinal stenosis of cervical region 06/26/2016   Cervical spondylosis with myelopathy 05/10/2016   Chronic neck pain 05/10/2016    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 [place] [address] Phone: [phone] Fax: [phone]  [facility] - [place], [place] - [address] [place] [address] Phone: [phone] Fax: [phone]     Social Determinants of Health (SDOH) Social History: SDOH Screenings   Food Insecurity: No Food Insecurity (11/24/2022)  Housing: Low Risk  (11/24/2022)  Transportation Needs: No Transportation Needs (11/24/2022)  Utilities: Not At Risk (11/24/2022)  Depression (PHQ2-9): Low Risk  (09/17/2022)  Financial Resource Strain: Low Risk  (11/13/2022)   Received from [facility]  Physical Activity: Unknown (11/13/2022)   Received from [facility]  Social Connections: Moderately Integrated (11/13/2022)   Received from [facility]  Stress: No Stress Concern Present (11/13/2022)   Received from [facility]  Tobacco Use: Medium Risk (11/24/2022)   SDOH Interventions:     Readmission Risk Interventions    06/09/2020    2:08 PM  Readmission Risk Prevention Plan  Post Dischage Appt Complete  Medication Screening Complete  Transportation Screening Complete

## 2022-11-28 ENCOUNTER — Inpatient Hospital Stay (HOSPITAL_COMMUNITY): Payer: HMO

## 2022-11-28 DIAGNOSIS — N184 Chronic kidney disease, stage 4 (severe): Secondary | ICD-10-CM | POA: Diagnosis not present

## 2022-11-28 DIAGNOSIS — N179 Acute kidney failure, unspecified: Secondary | ICD-10-CM | POA: Diagnosis not present

## 2022-11-28 DIAGNOSIS — I5031 Acute diastolic (congestive) heart failure: Secondary | ICD-10-CM | POA: Diagnosis not present

## 2022-11-28 DIAGNOSIS — R0902 Hypoxemia: Principal | ICD-10-CM

## 2022-11-28 DIAGNOSIS — J9601 Acute respiratory failure with hypoxia: Secondary | ICD-10-CM | POA: Diagnosis not present

## 2022-11-28 LAB — COMPREHENSIVE METABOLIC PANEL
ALT: 12 U/L (ref 0–44)
AST: 13 U/L — ABNORMAL LOW (ref 15–41)
Albumin: 2.9 g/dL — ABNORMAL LOW (ref 3.5–5.0)
Alkaline Phosphatase: 80 U/L (ref 38–126)
Anion gap: 13 (ref 5–15)
BUN: 46 mg/dL — ABNORMAL HIGH (ref 8–23)
CO2: 25 mmol/L (ref 22–32)
Calcium: 8.5 mg/dL — ABNORMAL LOW (ref 8.9–10.3)
Chloride: 100 mmol/L (ref 98–111)
Creatinine, Ser: 3.63 mg/dL — ABNORMAL HIGH (ref 0.61–1.24)
GFR, Estimated: 16 mL/min — ABNORMAL LOW (ref >60)
Glucose, Bld: 136 mg/dL — ABNORMAL HIGH (ref 70–99)
Potassium: 3.8 mmol/L (ref 3.5–5.1)
Sodium: 138 mmol/L (ref 135–145)
Total Bilirubin: 0.6 mg/dL (ref 0.3–1.2)
Total Protein: 6.6 g/dL (ref 6.5–8.1)

## 2022-11-28 LAB — CBC
HCT: 29.6 % — ABNORMAL LOW (ref 39.0–52.0)
Hemoglobin: 9.6 g/dL — ABNORMAL LOW (ref 13.0–17.0)
MCH: 29.6 pg (ref 26.0–34.0)
MCHC: 32.4 g/dL (ref 30.0–36.0)
MCV: 91.4 fL (ref 80.0–100.0)
Platelets: 225 10*3/uL (ref 150–400)
RBC: 3.24 MIL/uL — ABNORMAL LOW (ref 4.22–5.81)
RDW: 14.1 % (ref 11.5–15.5)
WBC: 10.3 10*3/uL (ref 4.0–10.5)
nRBC: 0 % (ref 0.0–0.2)

## 2022-11-28 LAB — GLUCOSE, CAPILLARY: Glucose-Capillary: 128 mg/dL — ABNORMAL HIGH (ref 70–99)

## 2022-11-28 LAB — MAGNESIUM: Magnesium: 2.2 mg/dL (ref 1.7–2.4)

## 2022-11-28 MED ORDER — FUROSEMIDE 10 MG/ML IJ SOLN
80.0000 mg | Freq: Once | INTRAMUSCULAR | Status: AC
  Administered 2022-11-28: 80 mg via INTRAVENOUS
  Filled 2022-11-28: qty 8

## 2022-11-28 MED ORDER — ALBUMIN HUMAN 25 % IV SOLN
25.0000 g | Freq: Once | INTRAVENOUS | Status: AC
  Administered 2022-11-28: 25 g via INTRAVENOUS
  Filled 2022-11-28: qty 100

## 2022-11-28 NOTE — Care Management Important Message (Signed)
Important Message  Patient Details  Name: Jeffery Fernandez MRN: [id number] Date of Birth: 09-Dec-1945   Important Message Given:  Yes - Medicare IM     Dorena Bodo 11/28/2022, 1:55 PM

## 2022-11-28 NOTE — Plan of Care (Signed)

## 2022-11-28 NOTE — Progress Notes (Signed)
Occupational Therapy Treatment Patient Details Name: Jeffery Fernandez MRN: [id number] DOB: 09-Jul-1945 Today's Date: 11/28/2022   History of present illness 77 y.o. presented with SOB and admitted for Acute hypoxic respiratory failure on 10/27. Pt with HFpEF. PMH of HTN ,OSA ,T2DM CKD III and Chronic back pain   OT comments  Patient with good progress toward patient focused goals.  On 4 L HFNC, and O2 sats to 89% at its lowest.  No complaint of SOB and HR up to 92.  Patient needing only generalized supervision for in room mobility and ADL completion from a sit to stand level.  OT will continue to follow in the acute setting, but no post acute OT is anticipated.        If plan is discharge home, recommend the following:  Assist for transportation;Assistance with cooking/housework;A little help with bathing/dressing/bathroom;A little help with walking and/or transfers   Equipment Recommendations  None recommended by OT    Recommendations for Other Services      Precautions / Restrictions Precautions Precautions: Fall Precaution Comments: Watch SpO2 & BP Restrictions Weight Bearing Restrictions: No       Mobility Bed Mobility Overal bed mobility: Modified Independent                  Transfers Overall transfer level: Needs assistance Equipment used: Rolling walker (2 wheels) Transfers: Sit to/from Stand, Bed to chair/wheelchair/BSC Sit to Stand: Supervision     Step pivot transfers: Supervision           Balance Overall balance assessment: Needs assistance Sitting-balance support: No upper extremity supported, Feet supported Sitting balance-Leahy Scale: Normal     Standing balance support: Bilateral upper extremity supported Standing balance-Leahy Scale: Fair                             ADL either performed or assessed with clinical judgement   ADL       Grooming: Wash/dry hands;Wash/dry face;Oral care;Supervision/safety;Standing                Lower Body Dressing: Supervision/safety;Sit to/from stand   Toilet Transfer: Supervision/safety;Ambulation;Regular Toilet                  Extremity/Trunk Assessment Upper Extremity Assessment Upper Extremity Assessment: Overall WFL for tasks assessed   Lower Extremity Assessment Lower Extremity Assessment: Defer to PT evaluation   Cervical / Trunk Assessment Cervical / Trunk Assessment: Kyphotic    Vision Baseline Vision/History: 1 Wears glasses Patient Visual Report: No change from baseline Additional Comments: Blind in R eye   Perception Perception Perception: Within Functional Limits   Praxis Praxis Praxis: WFL    Cognition Arousal: Alert Behavior During Therapy: WFL for tasks assessed/performed Overall Cognitive Status: Within Functional Limits for tasks assessed                                                             Pertinent Vitals/ Pain       Pain Assessment Pain Assessment: No/denies pain  Frequency  Min 1X/week        Progress Toward Goals  OT Goals(current goals can now be found in the care plan section)  Progress towards OT goals: Progressing toward goals  Acute Rehab OT Goals OT Goal Formulation: With patient Time For Goal Achievement: 12/10/22 Potential to Achieve Goals: Good  Plan      Co-evaluation                 AM-PAC OT "6 Clicks" Daily Activity     Outcome Measure   Help from another person eating meals?: None Help from another person taking care of personal grooming?: A Little Help from another person toileting, which includes using toliet, bedpan, or urinal?: A Little Help from another person bathing (including washing, rinsing, drying)?: A Little Help from another person to put on and taking off regular upper body clothing?: None Help from another person to put on and taking off regular lower  body clothing?: A Little 6 Click Score: 20    End of Session Equipment Utilized During Treatment: Rolling walker (2 wheels)  OT Visit Diagnosis: Muscle weakness (generalized) (M62.81)   Activity Tolerance Patient tolerated treatment well   Patient Left in bed;with call bell/phone within reach;with family/visitor present   Nurse Communication Mobility status        Time: 4132-4401 OT Time Calculation (min): 13 min  Charges: OT General Charges $OT Visit: 1 Visit OT Treatments $Self Care/Home Management : 8-22 mins  11/28/2022  RP, OTR/L  Acute Rehabilitation Services  Office:  [phone]   Suzanna Obey 11/28/2022, 10:18 AM

## 2022-11-28 NOTE — Progress Notes (Signed)
Rounding Note    Patient Name: Jeffery Fernandez Date of Encounter: 11/28/2022  [facility] Cardiologist: Chilton Si, MD   Subjective   Breathing much better today  Inpatient Medications    Scheduled Meds:  aspirin EC  81 mg Oral Daily   atorvastatin  40 mg Oral QHS   buPROPion  150 mg Oral q morning   enoxaparin (LOVENOX) injection  30 mg Subcutaneous Q24H   feeding supplement (GLUCERNA SHAKE)  237 mL Oral TID BM   gabapentin  100 mg Oral TID   melatonin  5 mg Oral QHS   sertraline  100 mg Oral Daily   Continuous Infusions:  albumin human     PRN Meds: acetaminophen   Vital Signs    Vitals:   11/27/22 2325 11/28/22 0325 11/28/22 0328 11/28/22 0825  BP: 126/68 122/73  96/67  Pulse: 85 91  94  Resp: 20 15  18   Temp: 98 F (36.7 C) 98.2 F (36.8 C)  98.4 F (36.9 C)  TempSrc: Oral Oral  Oral  SpO2: 91% 92%  95%  Weight:   85.6 kg   Height:        Intake/Output Summary (Last 24 hours) at 11/28/2022 0941 Last data filed at 11/28/2022 0300 Gross per 24 hour  Intake 474.47 ml  Output 950 ml  Net -475.53 ml   I/O since admission: -3378.5     11/28/2022    3:28 AM 11/27/2022    5:26 AM 11/26/2022    4:06 AM  Last 3 Weights  Weight (lbs) 188 lb 11.4 oz 186 lb 4.6 oz 187 lb 2.7 oz  Weight (kg) 85.6 kg 84.5 kg 84.9 kg      Telemetry    Sinus rhyhtm in the 90s - Personally Reviewed  ECG    11/24/2022 ECG (independently read by me): Sinus at 83, PVCs, LBBB  Physical Exam   GEN: No acute distress.   Neck: No JVD Cardiac: RRR, 1/6  murmurs, rubs, or gallops.  Respiratory: improved aeration; no wheezing GI: Soft, nontender, non-distended  MS: No edema; No deformity. Neuro:  Nonfocal  Psych: Normal affect   Labs    High Sensitivity Troponin:  No results for input(s): "TROPONINIHS" in the last 720 hours.   Chemistry Recent Labs  Lab 11/25/22 0222 11/25/22 1230 11/25/22 2229 11/27/22 0237 11/27/22 1116 11/28/22 0236  NA  137 140   < > 139 137 138  K 4.0 4.8   < > 3.9 3.8 3.8  CL 103 105   < > 102 98 100  CO2 25 25   < > 26 27 25   GLUCOSE 105* 147*   < > 117* 185* 136*  BUN 24* 26*   < > 41* 42* 46*  CREATININE 3.11* 3.21*   < > 3.55* 3.74* 3.63*  CALCIUM 8.3* 8.6*   < > 8.5* 8.3* 8.5*  MG  --  2.1  --  2.1  --  2.2  PROT 6.5  --   --   --   --  6.6  ALBUMIN 2.5* 2.6*   < > 2.5* 2.5* 2.9*  AST 10*  --   --   --   --  13*  ALT 9  --   --   --   --  12  ALKPHOS 87  --   --   --   --  80  BILITOT 0.5  --   --   --   --  0.6  GFRNONAA 20* 19*   < > 17* 16* 16*  ANIONGAP 9 10   < > 11 12 13    < > = values in this interval not displayed.    Lipids No results for input(s): "CHOL", "TRIG", "HDL", "LABVLDL", "LDLCALC", "CHOLHDL" in the last 168 hours.  Hematology Recent Labs  Lab 11/26/22 0241 11/27/22 0237 11/28/22 0236  WBC 10.4 10.4 10.3  RBC 3.40* 3.23* 3.24*  HGB 10.1* 9.5* 9.6*  HCT 31.1* 29.8* 29.6*  MCV 91.5 92.3 91.4  MCH 29.7 29.4 29.6  MCHC 32.5 31.9 32.4  RDW 14.0 14.1 14.1  PLT 237 230 225   Thyroid  Recent Labs  Lab 11/24/22 1302  TSH 3.366    BNP Recent Labs  Lab 11/24/22 1302  BNP 125.5*    DDimer No results for input(s): "DDIMER" in the last 168 hours.   Radiology    DG CHEST PORT 1 VIEW  Result Date: 11/27/2022 CLINICAL DATA:  Dyspnea. EXAM: PORTABLE CHEST 1 VIEW COMPARISON:  Chest x-ray November 24, 2022. FINDINGS: Cardiomegaly. Pulmonary vascular congestion. Interstitial opacities. No confluent consolidation. No visible pleural effusions or pneumothorax. Polyarticular degenerative change. Partially imaged ACDF. IMPRESSION: Persistent cardiomegaly, pulmonary vascular congestion and probable interstitial pulmonary edema. Electronically Signed   By: Feliberto Harts M.D.   On: 11/27/2022 15:59    Cardiac Studies   Echocardiogram 11/25/2022  1. Technically difficult study.   2. Left ventricular ejection fraction, by estimation, is 55 to 60%. The  left ventricle has  normal function. The left ventricle has no regional  wall motion abnormalities. There is mild concentric left ventricular  hypertrophy. Left ventricular diastolic  parameters are consistent with Grade I diastolic dysfunction (impaired  relaxation).   3. Right ventricular systolic function is normal. The right ventricular  size is normal.   4. The mitral valve is grossly normal. No evidence of mitral valve  regurgitation. No evidence of mitral stenosis.   5. The aortic valve is normal in structure. Aortic valve regurgitation is  not visualized. No aortic stenosis is present.   6. The inferior vena cava is normal in size with greater than 50%  respiratory variability, suggesting right atrial pressure of 3 mmHg   Patient Profile     77 y.o. 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 Ninetta Lights.   Assessment & Plan    Acute HFpEF:  EF 55 - 60% with Gr 1 DD. CXR yesterday with cardiomegaly, pulmonary vascular congestion and interstitial edema. BNP 125 only mildly elevated on 10/27. Wil re-check today. CKD, Stage 4: lisinopril dc'd, Cr today 3.74 > 3.63; appreciate Dr. Lily Lovings evaluation CAD: 50 - 60% stenosis in RCA at cath in 2012.  No current anginal symptoms Orthostatic hypotension:  BP this am remains soft at 96/67     For questions or updates, please contact [facility] Please consult [email or web address] for contact info under        Signed, Nicki Guadal[place], MD  11/28/2022, 9:41 AM

## 2022-11-28 NOTE — Consult Note (Signed)
NAMENICHOLAI Fernandez, MRN:  [id number], DOB:  07-24-1945, LOS: 3 ADMISSION DATE:  11/24/2022, CONSULTATION DATE:  11/28/2022 REFERRING MD:  Ginnie Smart, MD, CHIEF COMPLAINT:  hypoxemia   History of Present Illness:   The patient is a 77 year old gentleman with a past medical history of diabetes, hypertension, chronic kidney disease stage IV, OSA not on CPAP who presents with shortness of breath of 2 to 3 months.  He was admitted 3 days ago.  BNP is elevated.  He had an echocardiogram which showed low normal LVEF and diastolic dysfunction.  He has been diuresed with IV Lasix.  He is net negative over 3 L.  Overall he feels that his shortness of breath has improved since admission.  However he is still requiring oxygen which is why pulmonary's been consulted.  He denies recurrent bronchitis or pneumonia.  His shortness of breath symptoms really only started about 2 to 3 months ago.  His wife notes he has had decreased mobility due to this fatigue and shortness of breath.  Denies chest pain.  He has had some nonproductive cough as well.  Denies wheezing.  Has not been taking any inhalers.  No fevers.  Wife is also concerned about some weight loss.  Pertinent  Medical History  50-pack-year smoking history, quit 35 years ago Previously worked at [employer] for 17 years Also did factory work with [employer]   Significant Hospital Events: Including procedures, antibiotic start and stop dates in addition to other pertinent events     Interim History / Subjective:    Objective   Blood pressure (!) 84/66, pulse 79, temperature 98.2 F (36.8 C), temperature source Oral, resp. rate (!) 21, height 5\' 9"  (1.753 m), weight 85.6 kg, SpO2 94%.        Intake/Output Summary (Last 24 hours) at 11/28/2022 1132 Last data filed at 11/28/2022 0300 Gross per 24 hour  Intake 474.47 ml  Output 950 ml  Net -475.53 ml   Filed Weights   11/26/22 0406 11/27/22 0526 11/28/22 0328  Weight: 84.9 kg  84.5 kg 85.6 kg    Examination: General: eldlerly, sitting up in chair, able to speak in full sentences HENT: on nasal cannula, mmm Lungs: right basilar crackles Cardiovascular: RRR Abdomen: soft, nontender Extremities: no peripheral edema Neuro: normal speech, no focal asymmetry MSK: no rash  Resolved Hospital Problem list     Assessment & Plan:  Acute hypoxemic respiratory failure -Titrate down oxygen for goal saturations over 88%.  Encourage I-S and out of bed.  Dependent atelectasis could certainly be to blame for his ongoing hypoxemia his saturations improved to 99% when I had him do an incentive spirometry maneuver.  However given his occupational exposure and his crackles on exam with hypoxemia I think a CT chest is warranted.  Will proceed with high CT chest.  CKD stage IV Decompensated HFpEF -Continue maintenance to maintain slightly net negative fluid balance.  It seems he is approaching euvolemia  Rest per primary team.  Pulmonary will follow.   Durel Salts, MD Pulmonary and Critical Care Medicine [facility] 11/28/2022 11:39 AM Pager: see AMION  If no response to pager, please call critical care on call (see AMION) until 7pm After 7:00 pm call Elink     Labs   CBC: Recent Labs  Lab 11/24/22 1302 11/24/22 1830 11/25/22 0222 11/26/22 0241 11/27/22 0237 11/28/22 0236  WBC 9.8 9.6 8.9 10.4 10.4 10.3  NEUTROABS 7.2  --   --   --   --   --  HGB 10.4* 10.5* 9.8* 10.1* 9.5* 9.6*  HCT 31.5* 32.4* 29.8* 31.1* 29.8* 29.6*  MCV 93.5 91.8 92.5 91.5 92.3 91.4  PLT 237 246 235 237 230 225    Basic Metabolic Panel: Recent Labs  Lab 11/25/22 1230 11/25/22 2229 11/26/22 0241 11/26/22 1520 11/27/22 0237 11/27/22 1116 11/28/22 0236  NA 140 138 138 138 139 137 138  K 4.8 3.8 3.9 4.1 3.9 3.8 3.8  CL 105 103 104 101 102 98 100  CO2 25 22 23 26 26 27 25   GLUCOSE 147* 113* 119* 161* 117* 185* 136*  BUN 26* 32* 30* 37* 41* 42* 46*  CREATININE 3.21*  3.15* 3.27* 3.44* 3.55* 3.74* 3.63*  CALCIUM 8.6* 8.4* 8.5* 8.4* 8.5* 8.3* 8.5*  MG 2.1  --   --   --  2.1  --  2.2  PHOS 4.2 4.6 4.8* 5.4* 5.9* 4.5  --    GFR: Estimated Creatinine Clearance: 17.9 mL/min (A) (by C-G formula based on SCr of 3.63 mg/dL (H)). Recent Labs  Lab 11/25/22 0222 11/26/22 0241 11/27/22 0237 11/28/22 0236  WBC 8.9 10.4 10.4 10.3    Liver Function Tests: Recent Labs  Lab 11/25/22 0222 11/25/22 1230 11/26/22 0241 11/26/22 1520 11/27/22 0237 11/27/22 1116 11/28/22 0236  AST 10*  --   --   --   --   --  13*  ALT 9  --   --   --   --   --  12  ALKPHOS 87  --   --   --   --   --  80  BILITOT 0.5  --   --   --   --   --  0.6  PROT 6.5  --   --   --   --   --  6.6  ALBUMIN 2.5*   < > 2.6* 2.5* 2.5* 2.5* 2.9*   < > = values in this interval not displayed.   No results for input(s): "LIPASE", "AMYLASE" in the last 168 hours. No results for input(s): "AMMONIA" in the last 168 hours.  ABG    Component Value Date/Time   PHART 7.397 06/11/2010 1201   PCO2ART 41.6 06/11/2010 1201   PO2ART 63.0 (L) 06/11/2010 1201   HCO3 23.1 11/24/2022 1830   TCO2 30 06/11/2010 1206   ACIDBASEDEF 1.8 11/24/2022 1830   O2SAT 75.3 11/24/2022 1830     Coagulation Profile: No results for input(s): "INR", "PROTIME" in the last 168 hours.  Cardiac Enzymes: No results for input(s): "CKTOTAL", "CKMB", "CKMBINDEX", "TROPONINI" in the last 168 hours.  HbA1C: Hgb A1c MFr Bld  Date/Time Value Ref Range Status  11/24/2022 01:02 PM 5.6 4.8 - 5.6 % Final    Comment:    (NOTE) Pre diabetes:          5.7%-6.4%  Diabetes:              >6.4%  Glycemic control for   <7.0% adults with diabetes   05/11/2020 06:20 PM 9.3 (H) 4.8 - 5.6 % Final    Comment:    (NOTE) Pre diabetes:          5.7%-6.4%  Diabetes:              >6.4%  Glycemic control for   <7.0% adults with diabetes     CBG: Recent Labs  Lab 11/25/22 0640 11/26/22 0612 11/27/22 0641 11/28/22 0609   GLUCAP 130* 120* 150* 128*    Review of Systems:  As above in HPI  Past Medical History:  He,  has a past medical history of Arrhythmia, Arthritis, Cataract, Chest pain, unspecified, Chronic airway obstruction, not elsewhere classified, CKD (chronic kidney disease), Colon polyps, Depression, Diabetes mellitus, Diverticulosis, Gout, Heart murmur, History of kidney stones, Hyperlipemia, Hypertension, Nephrolithiasis, Normocytic anemia (04/23/2017), Obstructive sleep apnea (adult) (pediatric), Other and unspecified hyperlipidemia, Other left bundle branch block, Retinal vascular occlusion, unspecified, Shortness of breath, Unspecified sleep apnea, Vision loss of right eye, and White coat syndrome with diagnosis of hypertension (09/19/2017).   Surgical History:   Past Surgical History:  Procedure Laterality Date   ANTERIOR CERVICAL DECOMP/DISCECTOMY FUSION N/A 06/26/2016   Procedure: Re-Exploration of Cervical Wound;  Surgeon: Donalee Citrin, MD;  [facility];  Service: Neurosurgery;  Laterality: N/A;   ANTERIOR CERVICAL DECOMP/DISCECTOMY FUSION N/A 06/26/2016   Procedure: ANTERIOR CERVICAL DECOMPRESSION/DISCECTOMY FUSION, INTERBODY PROSTESIS, PLATE, CERVICAL THREE CERVICAL FOUR, CERVICAL FOUR CERVICAL FIVE CERVICAL SIX;  Surgeon: Tressie Stalker, MD;  Location: [facility] OR;  Service: Neurosurgery;  Laterality: N/A;   APPLICATION OF WOUND VAC  06/05/2020   Procedure: APPLICATION OF WOUND VAC;  Surgeon: Tressie Stalker, MD;  Location: [facility] OR;  Service: Neurosurgery;;   BACK SURGERY     CATARACT EXTRACTION Bilateral    KIDNEY STONE SURGERY     KYPHOPLASTY N/A 01/19/2018   Procedure: KYPHOPLASTY LUMBAR THREE;  Surgeon: Tressie Stalker, MD;  Location[facility] OR;  Service: Neurosurgery;  Laterality: N/A;  KYPHOPLASTY LUMBAR THREE   LUMBAR WOUND DEBRIDEMENT N/A 06/05/2020   Procedure: LUMBAR WOUND DEBRIDEMENT;  Surgeon: Tressie Stalker, MD;  Location: [facility] OR;  Service: Neurosurgery;  Laterality: N/A;     Social  History:   reports that he quit smoking about 36 years ago. His smoking use included cigarettes. He started smoking about 61 years ago. He has a 37.5 pack-year smoking history. He has never used smokeless tobacco. He reports that he does not currently use alcohol. He reports that he does not use drugs.   Family History:  His family history includes Arthritis/Rheumatoid in his cousin; Atrial fibrillation in his brother; Diabetes in an other family member; Heart attack (age of onset: 62) in his brother; Heart disease in his father; Parkinson's disease in his brother; Stroke in his father; Uterine cancer in his mother. There is no history of Colon cancer, Stomach cancer, Liver cancer, Rectal cancer, or Esophageal cancer.   Allergies Allergies  Allergen Reactions   Amlodipine Swelling    Patient report facial swelling at high doses   Penicillins Other (See Comments)    Possibly rash - not sure Has patient had a PCN reaction causing immediate rash, facial/tongue/throat swelling, SOB or lightheadedness with hypotension:unknown Has patient had a PCN reaction causing severe rash involving mucus membranes or skin necrosis: Unknown Has patient had a PCN reaction that required hospitalization: Unknown Has patient had a PCN reaction occurring within the last 10 years: No Childhood reaction If all of the above answers are "NO", then may proceed with Cephalosporin use.    Hydralazine Other (See Comments)    Dizziness     Home Medications  Prior to Admission medications   Medication Sig Start Date End Date Taking? Authorizing Provider  acetaminophen (TYLENOL) 500 MG tablet Take 1,000 mg by mouth every 6 (six) hours as needed (for pain.).   Yes [provider]  amLODipine (NORVASC) 5 MG tablet Take 5 mg by mouth daily.   Yes [provider]  aspirin EC 81 MG tablet Take 81 mg by  mouth daily. Swallow whole.   Yes [provider]  atorvastatin (LIPITOR) 40 MG tablet Take 40 mg  by mouth at bedtime.   Yes [provider]  buPROPion (WELLBUTRIN XL) 150 MG 24 hr tablet Take 150 mg by mouth every morning.   Yes [provider]  carvedilol (COREG) 25 MG tablet TAKE 1 TABLET BY MOUTH 2 TIMES DAILY WITH A MEAL. Patient taking differently: Take 25 mg by mouth 2 (two) times daily with a meal. 12/02/18  Yes Chilton Si, MD  Dulaglutide (TRULICITY) 1.5 MG/0.5ML SOAJ Inject 1.5 mg into the skin daily. Takes on Thursdays 10/01/22  Yes [provider]  gabapentin (NEURONTIN) 100 MG capsule Take 100 mg by mouth 3 (three) times daily. 100mg  in the morning and evening. 200mg  at night   Yes [provider]  HYDROcodone-acetaminophen (NORCO/VICODIN) 5-325 MG tablet Take 1 tablet by mouth daily as needed for moderate pain (pain score 4-6). 11/13/22 12/13/22 Yes [provider]  insulin glargine (LANTUS) 100 UNIT/ML injection Inject 40 Units into the skin daily.   Yes [provider]  lisinopril (ZESTRIL) 40 MG tablet TAKE 1 TABLET BY MOUTH EVERY DAY Patient taking differently: Take 40 mg by mouth daily. 10/01/19  Yes Chilton Si, MD  sertraline (ZOLOFT) 100 MG tablet Take 100 mg by mouth daily.   Yes [provider]  terazosin (HYTRIN) 5 MG capsule TAKE 1 CAPSULE BY MOUTH EVERYDAY AT BEDTIME Patient taking differently: Take 5 mg by mouth at bedtime. 06/26/18  Yes Chilton Si, MD  Turmeric (QC TUMERIC COMPLEX) 500 MG CAPS Take 2,400 mg by mouth in the morning and at bedtime.   Yes [provider]

## 2022-11-28 NOTE — Progress Notes (Addendum)
Mobility Specialist Progress Note:   11/28/22 1435  Orthostatic Lying   BP- Lying 126/83  Orthostatic Sitting  BP- Sitting 106/44  Orthostatic Standing at 0 minutes  BP- Standing at 0 minutes 94/53  Orthostatic Standing at 3 minutes  BP- Standing at 3 minutes (!) 84/67  Mobility  Activity Ambulated with assistance in hallway  Level of Assistance Contact guard assist, steadying assist  Assistive Device Front wheel walker  Distance Ambulated (ft) 90 ft  Activity Response Tolerated well  Mobility Referral Yes  $Mobility charge 1 Mobility  Mobility Specialist Start Time (ACUTE ONLY) 1105  Mobility Specialist Stop Time (ACUTE ONLY) 1120  Mobility Specialist Time Calculation (min) (ACUTE ONLY) 15 min   Pre Mobility: 88 HR ,  92% SpO2 5 L During Mobility: 92 HR , 81% - 92% SpO2 4 L  Post Mobility: 85 HR , 95 % SpO2 4 L  Pt received in bed, agreeable to mobility. Orthostatic vitals taken. Pt denied any lightheadedness or dizziness while standing even though BP dropped. Pt c/o lower back while standing and general leg weakness from laying in bed but able to ambulate short distance in hallway with CG for safety. Pt requiring verbal cues to stay within RW during turns, otherwise asymptomatic throughout. Pt desat to 81% on 4 L but quickly elevated above 90%. Pt denied any SOB during session. Pt left in chair asymptomatic with call bell in reach and all needs met. Family present.     Leory Plowman  Mobility Specialist Please contact via [employer] office at [phone]

## 2022-11-28 NOTE — Progress Notes (Signed)
Admit: 11/24/2022 LOS: 3  57M with AHRF partially improved, progressive CKD with nephrotic proteinuria.   Subjective:  Decent UOP after lasix/albumin Still 4L [place] Stable creatinine, potassium, magnesium Using incentive monitor regularly Out of bed to chair this morning, blood pressure soft but no symptomatic orthostasis  10/30 0701 - 10/31 0700 In: 474.5 [P.O.:390; IV Piggyback:84.5] Out: 950 [Urine:950]  Filed Weights   11/26/22 0406 11/27/22 0526 11/28/22 0328  Weight: 84.9 kg 84.5 kg 85.6 kg    Scheduled Meds:  aspirin EC  81 mg Oral Daily   atorvastatin  40 mg Oral QHS   buPROPion  150 mg Oral q morning   enoxaparin (LOVENOX) injection  30 mg Subcutaneous Q24H   feeding supplement (GLUCERNA SHAKE)  237 mL Oral TID BM   gabapentin  100 mg Oral TID   melatonin  5 mg Oral QHS   sertraline  100 mg Oral Daily   Continuous Infusions: PRN Meds:.acetaminophen  Current Labs: reviewed   Physical Exam:  Blood pressure 96/67, pulse 94, temperature 98.4 F (36.9 C), temperature source Oral, resp. rate 18, height 5\' 9"  (1.753 m), weight 85.6 kg, SpO2 95%. 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 Ronalee Belts at [facility] with progressive decline in GFR More recently GFR declined while admitted with diuresis Recent renal US reassuring AHRF suspect was AoC HFpEF, he has persistent hypoxia after diuresis DM2 Hypertension currently with orthostasis and on lisinopril. Anemia, mild, stable  P Continue to hold blood pressure medicines including lisinopril, do not resume upon discharge Repeat Lasix and albumin this morning Continue incentive spirometry, out of bed, mobilization Medication  Issues; Preferred narcotic agents for pain control are hydromorphone, fentanyl, and methadone. Morphine should not be used.  Baclofen should be avoided Avoid oral sodium phosphate and magnesium citrate based laxatives / bowel preps    Sabra Heck MD 11/28/2022, 10:31 AM  Recent Labs  Lab 11/26/22 1520 11/27/22 0237 11/27/22 1116 11/28/22 0236  NA 138 139 137 138  K 4.1 3.9 3.8 3.8  CL 101 102 98 100  CO2 26 26 27 25   GLUCOSE 161* 117* 185* 136*  BUN 37* 41* 42* 46*  CREATININE 3.44* 3.55* 3.74* 3.63*  CALCIUM 8.4* 8.5* 8.3* 8.5*  PHOS 5.4* 5.9* 4.5  --    Recent Labs  Lab 11/24/22 1302 11/24/22 1830 11/26/22 0241 11/27/22 0237 11/28/22 0236  WBC 9.8   < > 10.4 10.4 10.3  NEUTROABS 7.2  --   --   --   --   HGB 10.4*   < > 10.1* 9.5* 9.6*  HCT 31.5*   < > 31.1* 29.8* 29.6*  MCV 93.5   < > 91.5 92.3 91.4  PLT 237   < > 237 230 225   < > = values in this interval not displayed.

## 2022-11-28 NOTE — Plan of Care (Signed)

## 2022-11-28 NOTE — Progress Notes (Addendum)
HD#3 SUBJECTIVE:  Patient Summary: Jeffery Fernandez is a 77 y.o. with a pertinent PMH of HTN ,OSA ,T2DM CKD III and Chronic back pain, who presented with SOB  and admitted for Acute hypoxic respiratory failure secondary due to acute HFpEF excerebration.   Overnight Events:  Vtach around 12:53 in the 140s with wide qrs but but reverted back to RR. Nephrology saw pt ,recommended giving another single dose of 80 mg lasix ( Had around 6:15) with 25 g  of albumin.Cardiology also saw patient recommends involving Pulmonology if needed.Dr Tresa Endo of Cardiology still following.   Interim History: Patient said he is doing well. Was able to urinate better and says he slept better.  OBJECTIVE:  Vital Signs: Vitals:   11/27/22 2325 11/28/22 0325 11/28/22 0328 11/28/22 0825  BP: 126/68 122/73  96/67  Pulse: 85 91  94  Resp: 20 15  18   Temp: 98 F (36.7 C) 98.2 F (36.8 C)  98.4 F (36.9 C)  TempSrc: Oral Oral  Oral  SpO2: 91% 92%  95%  Weight:   85.6 kg   Height:       Supplemental O2: HFNC SpO2: 95 % O2 Flow Rate (L/min): 4 L/min  Filed Weights   11/26/22 0406 11/27/22 0526 11/28/22 0328  Weight: 84.9 kg 84.5 kg 85.6 kg     Intake/Output Summary (Last 24 hours) at 11/28/2022 0941 Last data filed at 11/28/2022 0300 Gross per 24 hour  Intake 474.47 ml  Output 950 ml  Net -475.53 ml   Net IO Since Admission: -3,378.53 mL [11/28/22 0941]  Physical Exam: Physical Exam Constitutional:      Comments: Laying in bed in no acute distress  Cardiovascular:     Rate and Rhythm: Normal rate.     Comments: Improved crackles Pulmonary:     Effort: Tachypnea present.     Comments: Improved Crackles.No wheezes appreciated             2.3L on IS  Abdominal:     Palpations: Abdomen is soft.  Musculoskeletal:     Right lower leg: No edema.     Left lower leg: No edema.  Skin:    General: Skin is warm and dry.  Neurological:     General: No focal deficit present.     Mental Status: He is  alert.  Psychiatric:        Mood and Affect: Mood normal.        Behavior: Behavior normal.    Patient Lines/Drains/Airways Status     Active Line/Drains/Airways     Name Placement date Placement time Site Days   Peripheral IV 11/24/22 20 G Left Antecubital 11/24/22  1343  Antecubital  1             ASSESSMENT/PLAN:  Assessment: Principal Problem:   Acute hypoxic respiratory failure (HCC) Active Problems:   Acute heart failure with preserved ejection fraction (HCC)   Acute renal failure superimposed on stage 4 chronic kidney disease (HCC)   Plan: #Acute Hypoxic Respiratory Failure  2/2 HFpEF excerebration  Patient's saturations are improving. Sating at 95% on 4L HFNC. on IS this morning. - Consult Pulmonology for further recs  - Continue Lasix and albumin per Nephrology - Cardiology recs appreciated  - Continue to wean down Oxygen requirement as tolerated  - Continue to monitor daily weights  - Continue PT/OT - Encourage use of Incentive Spirometry   #History Of Anemia Hgb base line of ( 9-10). At 9.6 today. -  Daily CBC  #Hx Chronic Kidney Disease IV Better urine output today. Cr getting better. Nephrology is following - Avoid nephrotoxic agents  - Continue to hold Lisinopril - Nephrology recs appreciated   #Hx of BPH - Hold Terazosin as this may contributing to positive orthostatics    #Hx of OSA - continue CPAP at night as needed  #Hx of Diabetes  Blood glucose is been at goal during this hospitalization.  - Continues Glucose monitoring   #Hx of HTN #Hx of HLD - Continue Amlodipine and monitor vitals  - Continue home Lipitor 40 mg  - Continue ASA 81 mg  #Hx of Depression - Continue home Sertraline  - Continue Bupropion 150 mg  #Hx of chronic low back pain - Tylenol 500 mg as needed   Best Practice: Diet: Renal diet VTE: enoxaparin (LOVENOX) injection 30 mg Start: 11/25/22 1000 Code: DNR Family Contact: Wife, at bedside. DISPO:  Anticipated discharge in 2 days to Home pending Medical work up and treatment   Signature: Jeffery Fernandez , MD Internal Medicine Resident, PGY-1 [facility]  Pager: [phone] 9:41 AM, 11/28/2022   Please contact the on call pager after 5 pm and on weekends at [phone].

## 2022-11-29 DIAGNOSIS — N184 Chronic kidney disease, stage 4 (severe): Secondary | ICD-10-CM | POA: Diagnosis not present

## 2022-11-29 DIAGNOSIS — I5030 Unspecified diastolic (congestive) heart failure: Secondary | ICD-10-CM | POA: Diagnosis not present

## 2022-11-29 DIAGNOSIS — N179 Acute kidney failure, unspecified: Secondary | ICD-10-CM | POA: Diagnosis not present

## 2022-11-29 DIAGNOSIS — J9601 Acute respiratory failure with hypoxia: Secondary | ICD-10-CM | POA: Diagnosis not present

## 2022-11-29 LAB — RENAL FUNCTION PANEL
Albumin: 3 g/dL — ABNORMAL LOW (ref 3.5–5.0)
Anion gap: 14 (ref 5–15)
BUN: 45 mg/dL — ABNORMAL HIGH (ref 8–23)
CO2: 24 mmol/L (ref 22–32)
Calcium: 8.5 mg/dL — ABNORMAL LOW (ref 8.9–10.3)
Chloride: 100 mmol/L (ref 98–111)
Creatinine, Ser: 3.38 mg/dL — ABNORMAL HIGH (ref 0.61–1.24)
GFR, Estimated: 18 mL/min — ABNORMAL LOW (ref >60)
Glucose, Bld: 174 mg/dL — ABNORMAL HIGH (ref 70–99)
Phosphorus: 5 mg/dL — ABNORMAL HIGH (ref 2.5–4.6)
Potassium: 3.5 mmol/L (ref 3.5–5.1)
Sodium: 138 mmol/L (ref 135–145)

## 2022-11-29 LAB — CBC
HCT: 29.4 % — ABNORMAL LOW (ref 39.0–52.0)
Hemoglobin: 9.6 g/dL — ABNORMAL LOW (ref 13.0–17.0)
MCH: 30 pg (ref 26.0–34.0)
MCHC: 32.7 g/dL (ref 30.0–36.0)
MCV: 91.9 fL (ref 80.0–100.0)
Platelets: 235 10*3/uL (ref 150–400)
RBC: 3.2 MIL/uL — ABNORMAL LOW (ref 4.22–5.81)
RDW: 13.9 % (ref 11.5–15.5)
WBC: 10.1 10*3/uL (ref 4.0–10.5)
nRBC: 0 % (ref 0.0–0.2)

## 2022-11-29 LAB — GLUCOSE, CAPILLARY: Glucose-Capillary: 135 mg/dL — ABNORMAL HIGH (ref 70–99)

## 2022-11-29 LAB — MAGNESIUM: Magnesium: 2.2 mg/dL (ref 1.7–2.4)

## 2022-11-29 LAB — BRAIN NATRIURETIC PEPTIDE: B Natriuretic Peptide: 42.6 pg/mL (ref 0.0–100.0)

## 2022-11-29 NOTE — Plan of Care (Signed)
  Problem: Clinical Measurements: Goal: Ability to maintain clinical measurements within normal limits will improve Outcome: Progressing Goal: Will remain free from infection Outcome: Progressing Goal: Diagnostic test results will improve Outcome: Progressing Goal: Respiratory complications will improve Outcome: Progressing Goal: Cardiovascular complication will be avoided Outcome: Progressing   Problem: Education: Goal: Knowledge of General Education information will improve Description: Including pain rating scale, medication(s)/side effects and non-pharmacologic comfort measures Outcome: Progressing   Problem: Activity: Goal: Risk for activity intolerance will decrease Outcome: Progressing

## 2022-11-29 NOTE — Progress Notes (Signed)
HD#4 SUBJECTIVE:  Patient Summary: Jeffery Fernandez is a 77 y.o. with a pertinent PMH of HTN ,OSA ,T2DM CKD III and Chronic back pain, who presented with SOB  and admitted for Acute hypoxic respiratory failure secondary due to acute HFpEF excerebration.   Overnight Events:  No overnight Event   Interim History: Patient was sitting in chair and says he is doing much better. He had concerns about when to go home.He slept well and didn't have any complaints this morning.  OBJECTIVE:  Vital Signs: Vitals:   11/28/22 1944 11/28/22 2310 11/29/22 0305 11/29/22 0803  BP: 139/72 132/69 138/67 112/62  Pulse: 89 87 87 100  Resp: 20 20 20 16   Temp: 98.5 F (36.9 C) 98.3 F (36.8 C) 98.4 F (36.9 C) 98.4 F (36.9 C)  TempSrc: Oral Oral Oral Oral  SpO2: 92% 94% 92% (!) 89%  Weight:   84.8 kg   Height:       Supplemental O2: HFNC SpO2: (!) 89 % O2 Flow Rate (L/min): 4 L/min  Filed Weights   11/27/22 0526 11/28/22 0328 11/29/22 0305  Weight: 84.5 kg 85.6 kg 84.8 kg     Intake/Output Summary (Last 24 hours) at 11/29/2022 0936 Last data filed at 11/29/2022 0400 Gross per 24 hour  Intake 240 ml  Output 750 ml  Net -510 ml   Net IO Since Admission: -3,888.53 mL [11/29/22 0936]  Physical Exam: Physical Exam Constitutional:      Comments: Sitting in the chair , in no acute distress   Cardiovascular:     Rate and Rhythm: Normal rate.     Comments: Improved crackles Pulmonary:     Comments: Crackles is much improved Sating at 97% on 3L [place] Abdominal:     Palpations: Abdomen is soft.  Musculoskeletal:     Right lower leg: No edema.     Left lower leg: No edema.  Skin:    General: Skin is warm and dry.  Neurological:     General: No focal deficit present.     Mental Status: He is alert.  Psychiatric:        Mood and Affect: Mood normal.        Behavior: Behavior normal.    Patient Lines/Drains/Airways Status     Active Line/Drains/Airways     Name Placement date Placement  time Site Days   Peripheral IV 11/24/22 20 G Left Antecubital 11/24/22  1343  Antecubital  1             ASSESSMENT/PLAN:  Assessment: Principal Problem:   Acute hypoxic respiratory failure (HCC) Active Problems:   Acute heart failure with preserved ejection fraction (HCC)   Acute renal failure superimposed on stage 4 chronic kidney disease (HCC)   Hypoxia   Plan: #Acute Hypoxic Respiratory Failure  2/2 HFpEF excerebration  Weaned patient down to 3L, sating at 97%.Will continue to wean him down. Doing better overall. BNP now WNL. Cardiology signed off. PCCM saw patient is recommending highly resolution CT chest to evaluate for pneumoconiosis.  - f/u on Nephro final recs  - Follow up on CT read - Encourage OOB - Continue PT/OT - Continue to wean off Oxygen requirement as tolerated - Discharge planning   #History Of Anemia Stable  - Daily CBC  #Hx Chronic Kidney Disease IV Patient continue to have adequate urine output. Appreciate nephrology recs   #Hx of BPH - Continue to hold Terazosin until orthostatics improve   #Hx of OSA - Nightly CPAP  as needed  #Hx of Diabetes  Blood glucose is been at goal during this hospitalization.  - Continues Glucose monitoring   #Hx of HTN #Hx of HLD - Continue Amlodipine and monitor vitals  - Continue home Lipitor 40 mg  - Continue ASA 81 mg  #Hx of Depression - Continue home Sertraline  - Continue Bupropion 150 mg  #Hx of chronic low back pain - Tylenol 500 mg as needed   Best Practice: Diet: Renal diet VTE: enoxaparin (LOVENOX) injection 30 mg Start: 11/25/22 1000 Code: DNR Family Contact: Wife, at bedside. DISPO: Anticipated discharge in 1 days to Home pending Medical work up and treatment   Signature: Kathleen Lime , MD Internal Medicine Resident, PGY-1 [facility]  Pager: [phone] 9:36 AM, 11/29/2022   Please contact the on call pager after 5 pm and on weekends at [phone].

## 2022-11-29 NOTE — Progress Notes (Signed)
Mobility Specialist Progress Note:   11/29/22 1600  Orthostatic Lying   BP- Lying 138/76  Orthostatic Sitting  BP- Sitting 126/73  Orthostatic Standing at 0 minutes  BP- Standing at 0 minutes 115/58  Oxygen Therapy  O2 Device Nasal Cannula  O2 Flow Rate (L/min) 3 L/min  Mobility  Activity Ambulated with assistance in hallway  Level of Assistance Contact guard assist, steadying assist  Assistive Device Front wheel walker  Distance Ambulated (ft) 100 ft  Activity Response Tolerated well  Mobility Referral Yes  $Mobility charge 1 Mobility  Mobility Specialist Start Time (ACUTE ONLY) 1543  Mobility Specialist Stop Time (ACUTE ONLY) 1603  Mobility Specialist Time Calculation (min) (ACUTE ONLY) 20 min    Pre Mobility: 88 HR, 95% SpO2 During Mobility: 106 HR,  92% SpO2 Post Mobility:  95 HR,  91% SpO2  Pt received in bed, agreeable to mobility. Asymptomatic throughout w/ no complaints. Ambulated in hallway and returned to room w/o fault. Pt left in chair with call bell and all needs met. Family present.  D'Vante Earlene Plater Mobility Specialist Please contact via Special educational needs teacher or Rehab office at [phone]

## 2022-11-29 NOTE — Progress Notes (Signed)
Jeffery Fernandez, MRN:  [id number], DOB:  08-25-1945, LOS: 4 ADMISSION DATE:  11/24/2022, CONSULTATION DATE:  11/29/2022 REFERRING MD:  Ginnie Smart, MD, CHIEF COMPLAINT:  hypoxemia   History of Present Illness:   The patient is a 77 year old gentleman with a past medical history of diabetes, hypertension, chronic kidney disease stage IV, OSA not on CPAP who presents with shortness of breath of 2 to 3 months.  He was admitted 3 days ago.  BNP is elevated.  He had an echocardiogram which showed low normal LVEF and diastolic dysfunction.  He has been diuresed with IV Lasix.  He is net negative over 3 L.  Overall he feels that his shortness of breath has improved since admission.  However he is still requiring oxygen which is why pulmonary's been consulted.  He denies recurrent bronchitis or pneumonia.  His shortness of breath symptoms really only started about 2 to 3 months ago.  His wife notes he has had decreased mobility due to this fatigue and shortness of breath.  Denies chest pain.  He has had some nonproductive cough as well.  Denies wheezing.  Has not been taking any inhalers.  No fevers.  Wife is also concerned about some weight loss.  Pertinent  Medical History  50-pack-year smoking history, quit 35 years ago Previously worked at [employer] for 17 years Also did factory work with [employer]   Significant Hospital Events: Including procedures, antibiotic start and stop dates in addition to other pertinent events     Interim History / Subjective:  Looks more comfortable  Objective   Blood pressure 112/62, pulse 100, temperature 98.4 F (36.9 C), temperature source Oral, resp. rate 16, height 5\' 9"  (1.753 m), weight 84.8 kg, SpO2 (!) 89%.        Intake/Output Summary (Last 24 hours) at 11/29/2022 1007 Last data filed at 11/29/2022 0400 Gross per 24 hour  Intake 240 ml  Output 750 ml  Net -510 ml   Filed Weights   11/27/22 0526 11/28/22 0328 11/29/22 0305   Weight: 84.5 kg 85.6 kg 84.8 kg    Examination: Elderly male sitting up in chair 3 L nasal cannula with sats of 97% JVD is appreciated Heart sounds are regular Diminished in the bases with some slight crackles Abdomen soft nontender Mild edema Resolved Hospital Problem list     Assessment & Plan:  Acute hypoxemic respiratory failure Wean O2 for sats greater than 88% Await results of CT scan   CKD stage IV Decompensated HFpEF Negative intake and output as tolerated Rest per primary team.  Pulmonary will follow.     Labs   CBC: Recent Labs  Lab 11/24/22 1302 11/24/22 1830 11/25/22 0222 11/26/22 0241 11/27/22 0237 11/28/22 0236 11/29/22 0248  WBC 9.8   < > 8.9 10.4 10.4 10.3 10.1  NEUTROABS 7.2  --   --   --   --   --   --   HGB 10.4*   < > 9.8* 10.1* 9.5* 9.6* 9.6*  HCT 31.5*   < > 29.8* 31.1* 29.8* 29.6* 29.4*  MCV 93.5   < > 92.5 91.5 92.3 91.4 91.9  PLT 237   < > 235 237 230 225 235   < > = values in this interval not displayed.    Basic Metabolic Panel: Recent Labs  Lab 11/25/22 1230 11/25/22 2229 11/26/22 0241 11/26/22 1520 11/27/22 0237 11/27/22 1116 11/28/22 0236 11/29/22 0248  NA 140   < >  138 138 139 137 138 138  K 4.8   < > 3.9 4.1 3.9 3.8 3.8 3.5  CL 105   < > 104 101 102 98 100 100  CO2 25   < > 23 26 26 27 25 24   GLUCOSE 147*   < > 119* 161* 117* 185* 136* 174*  BUN 26*   < > 30* 37* 41* 42* 46* 45*  CREATININE 3.21*   < > 3.27* 3.44* 3.55* 3.74* 3.63* 3.38*  CALCIUM 8.6*   < > 8.5* 8.4* 8.5* 8.3* 8.5* 8.5*  MG 2.1  --   --   --  2.1  --  2.2 2.2  PHOS 4.2   < > 4.8* 5.4* 5.9* 4.5  --  5.0*   < > = values in this interval not displayed.   GFR: Estimated Creatinine Clearance: 17.7 mL/min (A) (by C-G formula based on SCr of 3.38 mg/dL (H)). Recent Labs  Lab 11/26/22 0241 11/27/22 0237 11/28/22 0236 11/29/22 0248  WBC 10.4 10.4 10.3 10.1    Liver Function Tests: Recent Labs  Lab 11/25/22 0222 11/25/22 1230 11/26/22 1520  11/27/22 0237 11/27/22 1116 11/28/22 0236 11/29/22 0248  AST 10*  --   --   --   --  13*  --   ALT 9  --   --   --   --  12  --   ALKPHOS 87  --   --   --   --  80  --   BILITOT 0.5  --   --   --   --  0.6  --   PROT 6.5  --   --   --   --  6.6  --   ALBUMIN 2.5*   < > 2.5* 2.5* 2.5* 2.9* 3.0*   < > = values in this interval not displayed.   No results for input(s): "LIPASE", "AMYLASE" in the last 168 hours. No results for input(s): "AMMONIA" in the last 168 hours.  ABG    Component Value Date/Time   PHART 7.397 06/11/2010 1201   PCO2ART 41.6 06/11/2010 1201   PO2ART 63.0 (L) 06/11/2010 1201   HCO3 23.1 11/24/2022 1830   TCO2 30 06/11/2010 1206   ACIDBASEDEF 1.8 11/24/2022 1830   O2SAT 75.3 11/24/2022 1830     Coagulation Profile: No results for input(s): "INR", "PROTIME" in the last 168 hours.  Cardiac Enzymes: No results for input(s): "CKTOTAL", "CKMB", "CKMBINDEX", "TROPONINI" in the last 168 hours.  HbA1C: Hgb A1c MFr Bld  Date/Time Value Ref Range Status  11/24/2022 01:02 PM 5.6 4.8 - 5.6 % Final    Comment:    (NOTE) Pre diabetes:          5.7%-6.4%  Diabetes:              >6.4%  Glycemic control for   <7.0% adults with diabetes   05/11/2020 06:20 PM 9.3 (H) 4.8 - 5.6 % Final    Comment:    (NOTE) Pre diabetes:          5.7%-6.4%  Diabetes:              >6.4%  Glycemic control for   <7.0% adults with diabetes     CBG: Recent Labs  Lab 11/25/22 0640 11/26/22 0612 11/27/22 0641 11/28/22 0609 11/29/22 0618  GLUCAP 130* 120* 150* 128* 135*     Steve Philomene Haff ACNP Acute Care Nurse Practitioner [facility] Please consult Amion 11/29/2022, 10:07  AM

## 2022-11-29 NOTE — Progress Notes (Signed)
Physical Therapy Treatment Patient Details Name: Jeffery Fernandez MRN: [id number] DOB: 1945-02-15 Today's Date: 11/29/2022   History of Present Illness 77 y.o. presented with SOB and admitted for Acute hypoxic respiratory failure on 10/27. Pt with HFpEF. PMH of HTN ,OSA ,T2DM CKD III and Chronic back pain    PT Comments  Pt received sitting in the recliner with daughter present and supportive throughout. Pt eager to progress mobility, however pt with orthostatic BP readings (listed below), so session was limited to exercises in room. Pt able to stand without UE support with good power up, however some instability noted in standing without UE support. Pt noted baseline LLE weakness causing impaired balance. Pt able to march without UE support with intermittent min A for balance due to L lateral lean and cues for use of balance strategies for safety. Pt continues to benefit from PT services to progress toward functional mobility goals.   Orthostatic BPs  Sitting 111/62  Standing 98/55  Standing after 3 min 87/54      If plan is discharge home, recommend the following: A little help with walking and/or transfers;Help with stairs or ramp for entrance;A little help with bathing/dressing/bathroom;Assistance with cooking/housework   Can travel by private vehicle        Equipment Recommendations  None recommended by PT    Recommendations for Other Services       Precautions / Restrictions Precautions Precautions: Fall Precaution Comments: Watch SpO2 & BP Restrictions Weight Bearing Restrictions: No     Mobility  Bed Mobility Overal bed mobility: Modified Independent             General bed mobility comments: assist for lines, increased time/effort    Transfers Overall transfer level: Needs assistance Equipment used: Rolling walker (2 wheels) Transfers: Sit to/from Stand, Bed to chair/wheelchair/BSC Sit to Stand: Supervision, Contact guard assist   Step pivot transfers:  Contact guard assist       General transfer comment: STS from recliner with supervision with UE support progressing to CGA without UE support due to increased instability. CGA for balance and assist with line management for pivot to EOB.    Ambulation/Gait             Pre-gait activities: marching General Gait Details: deferred due to BP drop in standing      Balance Overall balance assessment: Needs assistance Sitting-balance support: No upper extremity supported, Feet supported Sitting balance-Leahy Scale: Normal     Standing balance support: Bilateral upper extremity supported, No upper extremity supported, During functional activity Standing balance-Leahy Scale: Fair Standing balance comment: increased instability when marching without UE support requiring CGA-min A for balance                            Cognition Arousal: Alert Behavior During Therapy: WFL for tasks assessed/performed Overall Cognitive Status: Within Functional Limits for tasks assessed                                          Exercises General Exercises - Lower Extremity Long Arc Quad: AROM, Seated, Both, 10 reps Hip Flexion/Marching: AROM, Seated, Standing, Both, 10 reps (x2) Other Exercises Other Exercises: sit<>stand x 5 without UE support with CGA Other Exercises: Marching without UE support for 10 sec x3    General Comments General comments (skin integrity, edema, etc.): Pt  on 2.5L throughout session with SpO2 > 94%, however RR up to 40 with exercise. BP noted to drop to 87/54 after 3 min standing with pt asymptomatic      Pertinent Vitals/Pain Pain Assessment Pain Assessment: No/denies pain     PT Goals (current goals can now be found in the care plan section) Acute Rehab PT Goals Patient Stated Goal: return home PT Goal Formulation: With patient Time For Goal Achievement: 12/10/22 Progress towards PT goals: Progressing toward goals     Frequency    Min 1X/week       AM-PAC PT "6 Clicks" Mobility   Outcome Measure  Help needed turning from your back to your side while in a flat bed without using bedrails?: None Help needed moving from lying on your back to sitting on the side of a flat bed without using bedrails?: None Help needed moving to and from a bed to a chair (including a wheelchair)?: A Little Help needed standing up from a chair using your arms (e.g., wheelchair or bedside chair)?: A Little Help needed to walk in hospital room?: A Little Help needed climbing 3-5 steps with a railing? : Total 6 Click Score: 18    End of Session Equipment Utilized During Treatment: Oxygen Activity Tolerance: Treatment limited secondary to medical complications (Comment) (orthostatic BP) Patient left: in bed;with call bell/phone within reach;with family/visitor present Nurse Communication: Mobility status;Other (comment) (BP readings) PT Visit Diagnosis: Muscle weakness (generalized) (M62.81);Unsteadiness on feet (R26.81)     Time: 1610-9604 PT Time Calculation (min) (ACUTE ONLY): 23 min  Charges:    $Therapeutic Exercise: 8-22 mins $Therapeutic Activity: 8-22 mins PT General Charges $$ ACUTE PT VISIT: 1 Visit                     Johny Shock, PTA Acute Rehabilitation Services Secure Chat Preferred  [phone]    Johny Shock 11/29/2022, 10:21 AM

## 2022-11-29 NOTE — Progress Notes (Signed)
Admit: 11/24/2022 LOS: 4  17M with AHRF partially improved, progressive CKD with nephrotic proteinuria.   Subjective:  About the same today, still requiring nasal cannula CT of the chest yesterday, not resulted yet 0.8 L UOP reported with single dose of Lasix Weights are still down about 21 pounds from admission Using incentive spirometry consistently Not much in terms of subjective dyspnea Creatinine is slightly improved at 3.4, K3.5 Continues to have orthostatic changes   10/31 0701 - 11/01 0700 In: 240 [P.O.:240] Out: 750 [Urine:750]  Filed Weights   11/27/22 0526 11/28/22 0328 11/29/22 0305  Weight: 84.5 kg 85.6 kg 84.8 kg    Scheduled Meds:  aspirin EC  81 mg Oral Daily   atorvastatin  40 mg Oral QHS   buPROPion  150 mg Oral q morning   enoxaparin (LOVENOX) injection  30 mg Subcutaneous Q24H   feeding supplement (GLUCERNA SHAKE)  237 mL Oral TID BM   gabapentin  100 mg Oral TID   melatonin  5 mg Oral QHS   sertraline  100 mg Oral Daily   Continuous Infusions: PRN Meds:.acetaminophen  Current Labs: reviewed   Physical Exam:  Blood pressure 126/68, pulse 93, temperature 98 F (36.7 C), temperature source Oral, resp. rate 20, height 5\' 9"  (1.753 m), weight 84.8 kg, SpO2 92%. 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 Ronalee Belts at [facility] with progressive decline in GFR More recently GFR declined while admitted with diuresis Recent renal US reassuring AHRF suspect was AoC HFpEF, he has persistent hypoxia after diuresis; CT of the chest pending.  Pulmonary following. DM2 Hypertension currently with orthostasis; lisinopril monotherapy discontinue Anemia, mild,  stable  P Continue to hold blood pressure medicines including lisinopril, do not resume upon discharge Hold Lasix for the next 24 hours because of orthostasis Continue incentive spirometry, out of bed, mobilization Medication Issues; Preferred narcotic agents for pain control are hydromorphone, fentanyl, and methadone. Morphine should not be used.  Baclofen should be avoided Avoid oral sodium phosphate and magnesium citrate based laxatives / bowel preps    Sabra Heck MD 11/29/2022, 1:53 PM  Recent Labs  Lab 11/27/22 0237 11/27/22 1116 11/28/22 0236 11/29/22 0248  NA 139 137 138 138  K 3.9 3.8 3.8 3.5  CL 102 98 100 100  CO2 26 27 25 24   GLUCOSE 117* 185* 136* 174*  BUN 41* 42* 46* 45*  CREATININE 3.55* 3.74* 3.63* 3.38*  CALCIUM 8.5* 8.3* 8.5* 8.5*  PHOS 5.9* 4.5  --  5.0*   Recent Labs  Lab 11/24/22 1302 11/24/22 1830 11/27/22 0237 11/28/22 0236 11/29/22 0248  WBC 9.8   < > 10.4 10.3 10.1  NEUTROABS 7.2  --   --   --   --   HGB 10.4*   < > 9.5* 9.6* 9.6*  HCT 31.5*   < > 29.8* 29.6* 29.4*  MCV 93.5   < > 92.3 91.4 91.9  PLT 237   < > 230 225 235   < > = values in this interval not displayed.

## 2022-11-29 NOTE — Progress Notes (Addendum)
Rounding Note    Patient Name: Jeffery Fernandez Date of Encounter: 11/29/2022  [facility] Cardiologist: Chilton Si, MD   Subjective   Breathing much better today; no chest pain  Inpatient Medications    Scheduled Meds:  aspirin EC  81 mg Oral Daily   atorvastatin  40 mg Oral QHS   buPROPion  150 mg Oral q morning   enoxaparin (LOVENOX) injection  30 mg Subcutaneous Q24H   feeding supplement (GLUCERNA SHAKE)  237 mL Oral TID BM   gabapentin  100 mg Oral TID   melatonin  5 mg Oral QHS   sertraline  100 mg Oral Daily   Continuous Infusions:   PRN Meds: acetaminophen   Vital Signs    Vitals:   11/28/22 1944 11/28/22 2310 11/29/22 0305 11/29/22 0803  BP: 139/72 132/69 138/67 112/62  Pulse: 89 87 87 100  Resp: 20 20 20 16   Temp: 98.5 F (36.9 C) 98.3 F (36.8 C) 98.4 F (36.9 C) 98.4 F (36.9 C)  TempSrc: Oral Oral Oral Oral  SpO2: 92% 94% 92% (!) 89%  Weight:   84.8 kg   Height:        Intake/Output Summary (Last 24 hours) at 11/29/2022 0928 Last data filed at 11/29/2022 0400 Gross per 24 hour  Intake 240 ml  Output 750 ml  Net -510 ml   I/O since admission: -3378.5     11/29/2022    3:05 AM 11/28/2022    3:28 AM 11/27/2022    5:26 AM  Last 3 Weights  Weight (lbs) 186 lb 15.2 oz 188 lb 11.4 oz 186 lb 4.6 oz  Weight (kg) 84.8 kg 85.6 kg 84.5 kg      Telemetry    Sinus rhyhtm in the 90s - Personally Reviewed  ECG    11/24/2022 ECG (independently read by me): Sinus at 83, PVCs, LBBB  Physical Exam   GEN: No acute distress.   Neck: No JVD Cardiac: RRR, 1/6  murmurs, rubs, or gallops.  Respiratory: improved aeration; no wheezing GI: Soft, nontender, non-distended  MS: No edema; No deformity. Neuro:  Nonfocal  Psych: Normal affect   Labs    High Sensitivity Troponin:  No results for input(s): "TROPONINIHS" in the last 720 hours.   Chemistry Recent Labs  Lab 11/25/22 0222 11/25/22 1230 11/27/22 0237 11/27/22 1116  11/28/22 0236 11/29/22 0248  NA 137   < > 139 137 138 138  K 4.0   < > 3.9 3.8 3.8 3.5  CL 103   < > 102 98 100 100  CO2 25   < > 26 27 25 24   GLUCOSE 105*   < > 117* 185* 136* 174*  BUN 24*   < > 41* 42* 46* 45*  CREATININE 3.11*   < > 3.55* 3.74* 3.63* 3.38*  CALCIUM 8.3*   < > 8.5* 8.3* 8.5* 8.5*  MG  --    < > 2.1  --  2.2 2.2  PROT 6.5  --   --   --  6.6  --   ALBUMIN 2.5*   < > 2.5* 2.5* 2.9* 3.0*  AST 10*  --   --   --  13*  --   ALT 9  --   --   --  12  --   ALKPHOS 87  --   --   --  80  --   BILITOT 0.5  --   --   --  0.6  --   GFRNONAA 20*   < > 17* 16* 16* 18*  ANIONGAP 9   < > 11 12 13 14    < > = values in this interval not displayed.    Lipids No results for input(s): "CHOL", "TRIG", "HDL", "LABVLDL", "LDLCALC", "CHOLHDL" in the last 168 hours.  Hematology Recent Labs  Lab 11/27/22 0237 11/28/22 0236 11/29/22 0248  WBC 10.4 10.3 10.1  RBC 3.23* 3.24* 3.20*  HGB 9.5* 9.6* 9.6*  HCT 29.8* 29.6* 29.4*  MCV 92.3 91.4 91.9  MCH 29.4 29.6 30.0  MCHC 31.9 32.4 32.7  RDW 14.1 14.1 13.9  PLT 230 225 235   Thyroid  Recent Labs  Lab 11/24/22 1302  TSH 3.366    BNP Recent Labs  Lab 11/24/22 1302 11/29/22 0248  BNP 125.5* 42.6    DDimer No results for input(s): "DDIMER" in the last 168 hours.   Radiology    DG CHEST PORT 1 VIEW  Result Date: 11/27/2022 CLINICAL DATA:  Dyspnea. EXAM: PORTABLE CHEST 1 VIEW COMPARISON:  Chest x-ray November 24, 2022. FINDINGS: Cardiomegaly. Pulmonary vascular congestion. Interstitial opacities. No confluent consolidation. No visible pleural effusions or pneumothorax. Polyarticular degenerative change. Partially imaged ACDF. IMPRESSION: Persistent cardiomegaly, pulmonary vascular congestion and probable interstitial pulmonary edema. Electronically Signed   By: Feliberto Harts M.D.   On: 11/27/2022 15:59    Cardiac Studies   Echocardiogram 11/25/2022  1. Technically difficult study.   2. Left ventricular ejection fraction,  by estimation, is 55 to 60%. The  left ventricle has normal function. The left ventricle has no regional  wall motion abnormalities. There is mild concentric left ventricular  hypertrophy. Left ventricular diastolic  parameters are consistent with Grade I diastolic dysfunction (impaired  relaxation).   3. Right ventricular systolic function is normal. The right ventricular  size is normal.   4. The mitral valve is grossly normal. No evidence of mitral valve  regurgitation. No evidence of mitral stenosis.   5. The aortic valve is normal in structure. Aortic valve regurgitation is  not visualized. No aortic stenosis is present.   6. The inferior vena cava is normal in size with greater than 50%  respiratory variability, suggesting right atrial pressure of 3 mmHg   Patient Profile     77 y.o. 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 Ninetta Lights.   Assessment & Plan    Acute HFpEF:  EF 55 - 60% with Gr 1 DD. CXR yesterday with cardiomegaly, pulmonary vascular congestion and interstitial edema. BNP 125 only mildly elevated on 10/27. Repeat today at 42.6 c/w normal volume status. CKD, Stage 4: lisinopril dc'd, Cr today 3.74 > 3.63 > 3.38 today;  appreciate Dr. Lily Lovings evaluation CAD: 50 - 60% stenosis in RCA at cath in 2012.  No current anginal symptoms Orthostatic hypotension: BP today 111/62 supine  but dropped to 87/54 post 3 minutes of standing; continue to stay off lisinopril  Anemia:  H/Hct 9.6/29.4 K 3.5.     Will sign off.    For questions or updates, please contact  [facility] Please consult [email or web address] for contact info under        Signed, Nicki Guadalajara, MD  11/29/2022, 9:28 AM

## 2022-11-30 ENCOUNTER — Telehealth: Payer: Self-pay | Admitting: Pulmonary Disease

## 2022-11-30 DIAGNOSIS — J9601 Acute respiratory failure with hypoxia: Secondary | ICD-10-CM | POA: Diagnosis not present

## 2022-11-30 LAB — GLUCOSE, CAPILLARY: Glucose-Capillary: 142 mg/dL — ABNORMAL HIGH (ref 70–99)

## 2022-11-30 LAB — RENAL FUNCTION PANEL
Albumin: 2.9 g/dL — ABNORMAL LOW (ref 3.5–5.0)
Anion gap: 11 (ref 5–15)
BUN: 40 mg/dL — ABNORMAL HIGH (ref 8–23)
CO2: 27 mmol/L (ref 22–32)
Calcium: 8.7 mg/dL — ABNORMAL LOW (ref 8.9–10.3)
Chloride: 101 mmol/L (ref 98–111)
Creatinine, Ser: 3.39 mg/dL — ABNORMAL HIGH (ref 0.61–1.24)
GFR, Estimated: 18 mL/min — ABNORMAL LOW (ref >60)
Glucose, Bld: 139 mg/dL — ABNORMAL HIGH (ref 70–99)
Phosphorus: 4.2 mg/dL (ref 2.5–4.6)
Potassium: 4.2 mmol/L (ref 3.5–5.1)
Sodium: 139 mmol/L (ref 135–145)

## 2022-11-30 LAB — MAGNESIUM: Magnesium: 2.3 mg/dL (ref 1.7–2.4)

## 2022-11-30 MED ORDER — TORSEMIDE 20 MG PO TABS
40.0000 mg | ORAL_TABLET | Freq: Every day | ORAL | Status: DC
  Administered 2022-11-30 – 2022-12-03 (×4): 40 mg via ORAL
  Filled 2022-11-30 (×4): qty 2

## 2022-11-30 NOTE — Plan of Care (Signed)
  Problem: Health Behavior/Discharge Planning: Goal: Ability to manage health-related needs will improve 11/30/2022 1816 by Vikki Ports, RN Outcome: Progressing 11/30/2022 1812 by Vikki Ports, RN Outcome: Progressing   Problem: Clinical Measurements: Goal: Ability to maintain clinical measurements within normal limits will improve 11/30/2022 1816 by Vikki Ports, RN Outcome: Progressing 11/30/2022 1812 by Vikki Ports, RN Outcome: Progressing   Problem: Clinical Measurements: Goal: Will remain free from infection 11/30/2022 1816 by Vikki Ports, RN Outcome: Progressing 11/30/2022 1812 by Vikki Ports, RN Outcome: Progressing   Problem: Clinical Measurements: Goal: Diagnostic test results will improve 11/30/2022 1816 by Vikki Ports, RN Outcome: Progressing 11/30/2022 1812 by Vikki Ports, RN Outcome: Progressing

## 2022-11-30 NOTE — Progress Notes (Signed)
Admit: 11/24/2022 LOS: 5  88M with AHRF partially improved, progressive CKD with nephrotic proteinuria.   Subjective:  No events overnight, weaned to 2 L nasal cannula Not much UOP yesterday but did not receive any diuretics CT of the chest with concern for right upper lobe lung cancer with possible hilar adenopathy, will need additional workup as an outpatient Creatinine and BUN are stable here today, K of 4.2 Weights are stable  11/01 0701 - 11/02 0700 In: 360 [P.O.:360] Out: 300 [Urine:300]  Filed Weights   11/27/22 0526 11/28/22 0328 11/29/22 0305  Weight: 84.5 kg 85.6 kg 84.8 kg    Scheduled Meds:  aspirin EC  81 mg Oral Daily   atorvastatin  40 mg Oral QHS   buPROPion  150 mg Oral q morning   enoxaparin (LOVENOX) injection  30 mg Subcutaneous Q24H   feeding supplement (GLUCERNA SHAKE)  237 mL Oral TID BM   gabapentin  100 mg Oral TID   melatonin  5 mg Oral QHS   sertraline  100 mg Oral Daily   torsemide  40 mg Oral Daily   Continuous Infusions: PRN Meds:.acetaminophen  Current Labs: reviewed   Physical Exam:  Blood pressure (!) 105/59, pulse 95, temperature 98.7 F (37.1 C), temperature source Oral, resp. rate 18, height 5\' 9"  (1.753 m), weight 84.8 kg, SpO2 99%. 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 Ronalee Belts at [facility] with progressive decline in GFR More recently GFR declined while admitted with diuresis Recent renal US reassuring AHRF suspect was AoC HFpEF, he has persistent hypoxia after diuresis; CT of the chest pending.  Pulmonary following. 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 Ronalee Belts, I will work on making an appointment Continue incentive spirometry, out of bed, mobilization Medication Issues; Preferred narcotic agents for pain control are hydromorphone, fentanyl, and methadone. Morphine should not be used.  Baclofen should be avoided Avoid oral sodium phosphate and magnesium citrate based laxatives / bowel preps    Sabra Heck MD 11/30/2022, 9:05 AM  Recent Labs  Lab 11/27/22 1116 11/28/22 0236 11/29/22 0248 11/30/22 0711  NA 137 138 138 139  K 3.8 3.8 3.5 4.2  CL 98 100 100 101  CO2 27 25 24 27   GLUCOSE 185* 136* 174* 139*  BUN 42* 46* 45* 40*  CREATININE 3.74* 3.63* 3.38* 3.39*  CALCIUM 8.3* 8.5* 8.5* 8.7*  PHOS 4.5  --  5.0* 4.2   Recent Labs  Lab 11/24/22 1302 11/24/22 1830 11/27/22 0237 11/28/22 0236 11/29/22 0248  WBC 9.8   < > 10.4 10.3 10.1  NEUTROABS 7.2  --   --   --   --   HGB 10.4*   < > 9.5* 9.6* 9.6*  HCT 31.5*   < > 29.8* 29.6* 29.4*  MCV 93.5   < > 92.3 91.4 91.9  PLT  237   < > 230 225 235   < > = values in this interval not displayed.

## 2022-11-30 NOTE — Progress Notes (Signed)
Mobility Specialist Progress Note:   11/30/22 1100  Orthostatic Lying   BP- Lying 119/68  Oxygen Therapy  O2 Device Room Air  Mobility  Activity Ambulated with assistance in hallway  Level of Assistance Contact guard assist, steadying assist  Assistive Device Front wheel walker  Distance Ambulated (ft) 100 ft  Activity Response Tolerated well  Mobility Referral Yes  $Mobility charge 1 Mobility  Mobility Specialist Start Time (ACUTE ONLY) 0901  Mobility Specialist Stop Time (ACUTE ONLY) 0916  Mobility Specialist Time Calculation (min) (ACUTE ONLY) 15 min    Pre Mobility: 84 HR,  119/68 (85) BP,  96% SpO2 During Mobility: 108 HR,  88% SpO2 Post Mobility:  102 HR,  92% SpO2  Pt received in bed, agreeable to mobility. RN suggested ambulation on room air. Asymptomatic w/ no complaints. Pt desat to 88% SpO2 during ambulation, but quickly rose to 92% with a brief standing rest break. RN aware. Pt left in bed with call bell and all needs met.  D'Vante Earlene Plater Mobility Specialist Please contact via Special educational needs teacher or Rehab office at [phone]

## 2022-11-30 NOTE — Plan of Care (Signed)

## 2022-11-30 NOTE — Telephone Encounter (Signed)
Patient being discharged 12/01/2022  Schedule appointment to see Dr. Tonia Brooms or [facility] for right upper lobe mass with adenopathy  PET scan order placed, should have PET scan prior to visit

## 2022-11-30 NOTE — Progress Notes (Signed)
   Jeffery Fernandez, MRN:  [id number], DOB:  [birth year], LOS: 5 ADMISSION DATE:  11/24/2022, CONSULTATION DATE:  11/30/2022 REFERRING MD:  Mercie Eon, MD, CHIEF COMPLAINT:  hypoxemia   History of Present Illness:   The patient is a 77 year old gentleman with a past medical history of diabetes, hypertension, chronic kidney disease stage IV, OSA not on CPAP who presents with shortness of breath of 2 to 3 months.  He was admitted 3 days ago.  BNP is elevated.  He had an echocardiogram which showed low normal LVEF and diastolic dysfunction.  He has been diuresed with IV Lasix.  He is net negative over 3 L.  Overall he feels that his shortness of breath has improved since admission.  However he is still requiring oxygen which is why pulmonary's been consulted.  He denies recurrent bronchitis or pneumonia.  His shortness of breath symptoms really only started about 2 to 3 months ago.  His wife notes he has had decreased mobility due to this fatigue and shortness of breath.  Denies chest pain.  He has had some nonproductive cough as well.  Denies wheezing.  Has not been taking any inhalers.  No fevers.  Wife is also concerned about some weight loss.  Pertinent  Medical History  50-pack-year smoking history, quit 35 years ago Previously worked at [employer] for 17 years Also did factory work with [employer]   Significant Hospital Events: Including procedures, antibiotic start and stop dates in addition to other pertinent events   11/1-CT chest -Reviewed by myself, right upper lobe mass, interstitial process  Interim History / Subjective:  Looks more comfortable Oxygen requirement improving Objective   Blood pressure 129/62, pulse 96, temperature 98 F (36.7 C), temperature source Oral, resp. rate 16, height 5\' 9"  (1.753 m), weight 84.8 kg, SpO2 92%.        Intake/Output Summary (Last 24 hours) at 11/30/2022 1438 Last data filed at 11/30/2022 0700 Gross per 24 hour  Intake 360 ml   Output 300 ml  Net 60 ml   Filed Weights   11/27/22 0526 11/28/22 0328 11/29/22 0305  Weight: 84.5 kg 85.6 kg 84.8 kg    Examination: Elderly, not in distress Moist oral mucosa S1-S2 appreciated Rales at the bases bilaterally Abdomen is soft, bowel sounds appreciated Mild extremity edema  Resolved Hospital Problem list     Assessment & Plan:  Acute hypoxemic respiratory failure -Continue oxygen supplementation  Interstitial lung disease likely related to current workers lungs  Chronic kidney disease stage IV  Decompensated heart failure with preserved ejection fraction  Lung mass  Schedule for PET scan as outpatient-I did place order  Will need to follow-up with Dr. Tonia Brooms or Delton Coombes in pulmonary as outpatient  No concerns regarding being able to discharge patient 12/01/2022 Telephone request placed in chart for patient to be scheduled to see Dr. Delton Coombes or [facility] in follow-up for the right upper lobe mass process  Virl Diamond, MD [facility] Pager: See Amion      CKD stage IV Decompensated HFpEF Negative intake and output as tolerated Rest per primary team.  Pulmonary will follow.

## 2022-11-30 NOTE — Plan of Care (Signed)
  Problem: Clinical Measurements: Goal: Ability to maintain clinical measurements within normal limits will improve Outcome: Progressing   Problem: Clinical Measurements: Goal: Will remain free from infection Outcome: Progressing   Problem: Clinical Measurements: Goal: Diagnostic test results will improve Outcome: Progressing   

## 2022-11-30 NOTE — Progress Notes (Signed)
SATURATION QUALIFICATIONS: (This note is used to comply with regulatory documentation for home oxygen)  Patient Saturations on Room Air at Rest = 89-91%  Patient Saturations on Room Air while Ambulating = 88%  Patient Saturations on 2 Liters of oxygen while Ambulating = 93%  Please briefly explain why patient needs home oxygen: 88% while ambulating. SOB with activity.

## 2022-11-30 NOTE — Progress Notes (Signed)
Hospital day#5 Subjective:   Summary: Jeffery Fernandez is a 77 yo with PHX significant for HTN, HLD, NIDDM, CKD4, chronic back pain admitted for acute hypoxic respiratory failure due to HFpEF exacerbation. His hospital course has been complicated by new ILD and pulmonary nodules likely contributing to hypoxemic state and an AKI on CKD4 after high doses of loop diuretics, now recovering.   Overnight Events: None   Doing much better this AM with only intermitted use of supplemental O2 at 2L for intermittent desaturations below 89%. Has been doing better with ambulation out of bed with PT. No dizziness with standing today. . Discussed Dr. Marisue Humble saw patient this AM. Patient has been urinating more without symptoms of retention or pressure now that he is off his BPH medications.   His daughter is in the room; discussed CT chest findings and likely progression of evaluation once discharged.   Objective:  Vital signs in last 24 hours: Vitals:   11/29/22 1631 11/29/22 1935 11/29/22 2234 11/30/22 0349  BP: 127/65     Pulse: 98     Resp: 16 18 18    Temp: 98.3 F (36.8 C) 98.4 F (36.9 C) 98.4 F (36.9 C) 98.4 F (36.9 C)  TempSrc: Oral Oral Oral Oral  SpO2: 92%  92%   Weight:      Height:       Supplemental O2: Nasal Cannula SpO2: 92 % O2 Flow Rate (L/min): 2 L/min Filed Weights   11/27/22 0526 11/28/22 0328 11/29/22 0305  Weight: 84.5 kg 85.6 kg 84.8 kg    Physical Exam:  Constitutional:well-appearing elderly man laying flat in bed HENT: normocephalic atraumatic, moist mucous membranes Neck: supple, no JVP on exam Cardiovascular: regular rate and rhythm, no m/r/g, 2+ radial, DP and TP pulses bilaterally Pulmonary/Chest: normal work of breathing on room air, lungs clear to auscultation bilaterally. No wheezing. Decreased crackles Abdominal: Normal BS, soft, non-tender, non-distended. No fluid waves MSK: normal bulk and tone.No pitting  Neurological: alert & oriented x 3, 5/5  strength in bilateral upper and lower extremities Skin: warm and dry Psych: Pleasant mood and affect    Intake/Output Summary (Last 24 hours) at 11/30/2022 0548 Last data filed at 11/30/2022 0349 Gross per 24 hour  Intake --  Output 300 ml  Net -300 ml   Net IO Since Admission: -4,188.53 mL [11/30/22 0548]  Pertinent Labs:    Latest Ref Rng & Units 11/29/2022    2:48 AM 11/28/2022    2:36 AM 11/27/2022    2:37 AM  CBC  WBC 4.0 - 10.5 K/uL 10.1  10.3  10.4   Hemoglobin 13.0 - 17.0 g/dL 9.6  9.6  9.5   Hematocrit 39.0 - 52.0 % 29.4  29.6  29.8   Platelets 150 - 400 K/uL 235  225  230        Latest Ref Rng & Units 11/29/2022    2:48 AM 11/28/2022    2:36 AM 11/27/2022   11:16 AM  CMP  Glucose 70 - 99 mg/dL 161  096  045   BUN 8 - 23 mg/dL 45  46  42   Creatinine 0.61 - 1.24 mg/dL 4.09  8.11  9.14   Sodium 135 - 145 mmol/L 138  138  137   Potassium 3.5 - 5.1 mmol/L 3.5  3.8  3.8   Chloride 98 - 111 mmol/L 100  100  98   CO2 22 - 32 mmol/L 24  25  27  Calcium 8.9 - 10.3 mg/dL 8.5  8.5  8.3   Total Protein 6.5 - 8.1 g/dL  6.6    Total Bilirubin 0.3 - 1.2 mg/dL  0.6    Alkaline Phos 38 - 126 U/L  80    AST 15 - 41 U/L  13    ALT 0 - 44 U/L  12      Imaging: No results found.  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 6lb down since admission. No peripheral congestion on exam. Cardiology has signed off. Pulmonary evaluation with structural changes (ILD, pulmonary nodules) seen on CT likely contributing. Furosemide held on 11/1 for continued orthostatic symptoms. Despite this, patient continues to improve. Likely will  be discharged home with oxygen requirement. Will continue to monitor today on oral diuretics to assess activity and safety to transition to home -orthostatic vitals today -Strict ins and out -Monitor supplemental O2 now on intermittent 2L supp O2 -Sat goals 89-93% -Transition to torsemide 40 mg fos discharge planning; appreciate nephrology recommendations -Encourage mobility and incentive spirometry  AKI on CKD4 Sub nephrotic proteinuria Normocytic anemia, stable Baseline Cr. 2.65 GFR 22. Improvement in renal function since high dose of lasix have been discontinued this admission. Cr. 3.39, and GFR 18 stable from 11/1. Per nephrology, to be transitioned to oral diuretic to establish diuresis -Transition to torsemide 40 mg fos discharge planning; appreciate nephrology recommendations -RFP and magnesium in the AM -Discontinue ACEi indefinitely -Strict in and outs -Outpatient follow up with Dr. Ronalee Belts at [facility] -Avoid oral sodium phosphate and magnesium citrate based laxatives / bowel preps  -Baclofen should be avoided  -Monitor K; now on renal diet  RUL mass  Lingular nodule, 1.3 cm Concern for primary malignancy 4.1 x 2.9 x 7.2 cm concerning for primary lung malignancy seen on HD CT scan of the chest. Pulmonology recommends PET CT with likely bronchoalveolar lavage to better assess mass.  -PET CT; outpatient work up per pulmonary  DM -SSI insulin  Hypertension CAD -Holding lisinopril indefinitely -May resume home amlodipine and Coreg at discharge -Continue Lipitor and ASA  BPH -Continue to hold medications in setting of orthostasis  Diet: Renal diet VTE: Enoxaparin Code: DNR/DNI PT/OT recs: HH PT Family Update:  Dispo: Anticipated discharge to Home in 1 days pending clinical improvement.   Jeffery Crocker, MD Internal Medicine Resident PGY-2 Please contact the on call pager after 5 pm and on weekends at [phone].

## 2022-12-01 ENCOUNTER — Inpatient Hospital Stay (HOSPITAL_COMMUNITY): Payer: HMO

## 2022-12-01 DIAGNOSIS — J9601 Acute respiratory failure with hypoxia: Secondary | ICD-10-CM | POA: Diagnosis not present

## 2022-12-01 LAB — RENAL FUNCTION PANEL
Albumin: 2.9 g/dL — ABNORMAL LOW (ref 3.5–5.0)
Anion gap: 11 (ref 5–15)
BUN: 41 mg/dL — ABNORMAL HIGH (ref 8–23)
CO2: 26 mmol/L (ref 22–32)
Calcium: 8.9 mg/dL (ref 8.9–10.3)
Chloride: 102 mmol/L (ref 98–111)
Creatinine, Ser: 3.49 mg/dL — ABNORMAL HIGH (ref 0.61–1.24)
GFR, Estimated: 17 mL/min — ABNORMAL LOW (ref >60)
Glucose, Bld: 151 mg/dL — ABNORMAL HIGH (ref 70–99)
Phosphorus: 4.4 mg/dL (ref 2.5–4.6)
Potassium: 3.9 mmol/L (ref 3.5–5.1)
Sodium: 139 mmol/L (ref 135–145)

## 2022-12-01 LAB — CBC
HCT: 29.8 % — ABNORMAL LOW (ref 39.0–52.0)
Hemoglobin: 9.4 g/dL — ABNORMAL LOW (ref 13.0–17.0)
MCH: 29 pg (ref 26.0–34.0)
MCHC: 31.5 g/dL (ref 30.0–36.0)
MCV: 92 fL (ref 80.0–100.0)
Platelets: 228 10*3/uL (ref 150–400)
RBC: 3.24 MIL/uL — ABNORMAL LOW (ref 4.22–5.81)
RDW: 13.9 % (ref 11.5–15.5)
WBC: 8.5 10*3/uL (ref 4.0–10.5)
nRBC: 0 % (ref 0.0–0.2)

## 2022-12-01 LAB — GLUCOSE, CAPILLARY: Glucose-Capillary: 135 mg/dL — ABNORMAL HIGH (ref 70–99)

## 2022-12-01 LAB — MAGNESIUM: Magnesium: 2.2 mg/dL (ref 1.7–2.4)

## 2022-12-01 MED ORDER — IPRATROPIUM-ALBUTEROL 0.5-2.5 (3) MG/3ML IN SOLN: 3.0000 mL | Freq: Four times a day (QID) | RESPIRATORY_TRACT | Status: DC | PRN

## 2022-12-01 NOTE — Plan of Care (Signed)

## 2022-12-01 NOTE — Progress Notes (Signed)
Mobility Specialist Progress Note:   12/01/22 1000  Orthostatic Sitting  BP- Sitting 141/78  Orthostatic Standing at 0 minutes  BP- Standing at 0 minutes 105/66  Oxygen Therapy  O2 Device Nasal Cannula  O2 Flow Rate (L/min) 4 L/min  Mobility  Activity Ambulated with assistance in hallway  Level of Assistance Contact guard assist, steadying assist  Assistive Device Front wheel walker  Distance Ambulated (ft) 340 ft  Activity Response Tolerated well  Mobility Referral Yes  $Mobility charge 1 Mobility  Mobility Specialist Start Time (ACUTE ONLY) 0945  Mobility Specialist Stop Time (ACUTE ONLY) 1000  Mobility Specialist Time Calculation (min) (ACUTE ONLY) 15 min    Pre Mobility: 96 HR,  91% SpO2 Post Mobility:  107 HR,  90% SpO2  Pt received in chair, eager to mobility. Asymptomatic w/ no complaints throughout. Pt left standing at sink w/ daughter to brush teeth. Call bell near and all needs met.  D'Vante Earlene Plater Mobility Specialist Please contact via Special educational needs teacher or Rehab office at [phone]

## 2022-12-01 NOTE — Progress Notes (Signed)
Admit: 11/24/2022 LOS: 6  66M with AHRF partially improved, progressive CKD with nephrotic proteinuria.   Subjective:  No events overnight, still req [facility] discussed ct results with him and family Stable SCr, vol status seems about the same on 40mg  qAM torsemide K 3.9  11/02 0701 - 11/03 0700 In: 480 [P.O.:480] Out: 700 [Urine:700]  Filed Weights   11/28/22 0328 11/29/22 0305 12/01/22 0335  Weight: 85.6 kg 84.8 kg 85.4 kg    Scheduled Meds:  aspirin EC  81 mg Oral Daily   atorvastatin  40 mg Oral QHS   buPROPion  150 mg Oral q morning   feeding supplement (GLUCERNA SHAKE)  237 mL Oral TID BM   gabapentin  100 mg Oral TID   melatonin  5 mg Oral QHS   sertraline  100 mg Oral Daily   torsemide  40 mg Oral Daily   Continuous Infusions: PRN Meds:.acetaminophen  Current Labs: reviewed   Physical Exam:  Blood pressure 136/76, pulse 87, temperature 98 F (36.7 C), temperature source Oral, resp. rate 19, height 5\' 9"  (1.753 m), weight 85.4 kg, SpO2 94%. 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 Ronalee Belts at [facility] with progressive decline in GFR More recently GFR declined while admitted with diuresis Recent renal US reassuring AHRF suspect was AoC HFpEF, he has persistent hypoxia after diuresis; CT of the chest with findings #6 and #7.  Pulmonary following. 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  Please call with any questions or concerns.  Medication Issues; Preferred narcotic agents for pain control are hydromorphone, fentanyl, and methadone. Morphine should not be used.  Baclofen should be avoided Avoid oral sodium phosphate and magnesium citrate based laxatives / bowel preps    Sabra Heck MD 12/01/2022, 8:24 AM  Recent Labs  Lab 11/29/22 0248 11/30/22 0711 12/01/22 0234  NA 138 139 139  K 3.5 4.2 3.9  CL 100 101 102  CO2 24 27 26   GLUCOSE 174* 139* 151*  BUN 45* 40* 41*  CREATININE 3.38* 3.39* 3.49*  CALCIUM 8.5* 8.7* 8.9  PHOS 5.0* 4.2 4.4   Recent Labs  Lab 11/24/22 1302 11/24/22 1830 11/28/22 0236 11/29/22 0248 12/01/22 0234  WBC 9.8   < > 10.3 10.1 8.5  NEUTROABS 7.2  --   --   --   --   HGB 10.4*   < > 9.6* 9.6* 9.4*  HCT 31.5*   < > 29.6* 29.4* 29.8*  MCV 93.5   < > 91.4 91.9 92.0  PLT 237   < > 225 235 228   < > = values in this interval not displayed.

## 2022-12-01 NOTE — Progress Notes (Signed)
Hospital day#6 Subjective:   Summary: Jeffery Fernandez is a 77 yo with PHX significant for HTN, HLD, NIDDM, CKD4, chronic back pain admitted for acute hypoxic respiratory failure due to HFpEF exacerbation. His hospital course has been complicated by new ILD and pulmonary nodules likely contributing to hypoxemic state and an AKI on CKD4 after high doses of loop diuretics, now on oral torsemide and with stockings working on orthostatic hypotension  Overnight Events: Increased supplemental oxygen requirement to 5L  [name] examined at bedside with increased oxygen requirement and increased sputum production. No fever or chills. Has been asymptomatic despite orthostasis with ambulation. No pain with urination today. There was a scant amount of blood on the side of the bed from overnight. RN and daughter at bedside.   Objective:  Vital signs in last 24 hours: Vitals:   12/01/22 0400 12/01/22 0600 12/01/22 0709 12/01/22 1116  BP:   136/76 122/63  Pulse: 89 87    Resp: 17 19    Temp:   98 F (36.7 C) 98.2 F (36.8 C)  TempSrc:   Oral Oral  SpO2: 92% 94%    Weight:      Height:       Supplemental O2: Nasal Cannula SpO2: 94 % O2 Flow Rate (L/min): 4 L/min Filed Weights   11/28/22 0328 11/29/22 0305 12/01/22 0335  Weight: 85.6 kg 84.8 kg 85.4 kg    Physical Exam:  Constitutional:well-appearing elderly man sitting in chair at the bedside HENT: moist mucus membreanes Neck: supple,  Cardiovascular:tachycardia Pulmonary/Chest: normal work of breathing on room air, decrease breath sounds througout with crackles in bilateral bases. Minimal wheezing Abdominal: Normal BS, soft, non-tender, non-distended. No fluid waves MSK: normal bulk and tone.No pitting  Neurological: alert & oriented x 3,  normal gait with one assist Skin: warm and dry Psych: Pleasant mood and affect    Intake/Output Summary (Last 24 hours) at 12/01/2022 1129 Last data filed at 12/01/2022 0715 Gross per 24 hour   Intake 720 ml  Output 200 ml  Net 520 ml   Net IO Since Admission: -3,808.53 mL [12/01/22 1129]  Pertinent Labs:    Latest Ref Rng & Units 12/01/2022    2:34 AM 11/29/2022    2:48 AM 11/28/2022    2:36 AM  CBC  WBC 4.0 - 10.5 K/uL 8.5  10.1  10.3   Hemoglobin 13.0 - 17.0 g/dL 9.4  9.6  9.6   Hematocrit 39.0 - 52.0 % 29.8  29.4  29.6   Platelets 150 - 400 K/uL 228  235  225        Latest Ref Rng & Units 12/01/2022    2:34 AM 11/30/2022    7:11 AM 11/29/2022    2:48 AM  CMP  Glucose 70 - 99 mg/dL 782  956  213   BUN 8 - 23 mg/dL 41  40  45   Creatinine 0.61 - 1.24 mg/dL 0.86  5.78  4.69   Sodium 135 - 145 mmol/L 139  139  138   Potassium 3.5 - 5.1 mmol/L 3.9  4.2  3.5   Chloride 98 - 111 mmol/L 102  101  100   CO2 22 - 32 mmol/L 26  27  24    Calcium 8.9 - 10.3 mg/dL 8.9  8.7  8.5     Imaging: DG Chest Port 1 View  Result Date: 12/01/2022 CLINICAL DATA:  62952 with hypoxemia and shortness of breath. EXAM: PORTABLE CHEST 1 VIEW COMPARISON:  Chest HRCT without contrast 11/28/2022. FINDINGS: 6:16 a.m. Interstitial and patchy peripheral ground-glass opacities are noted on the left-greater-than-right possibly a combination of chronic change and superimposed infection. A right upper lobe apical lung mass is also again seen. Remaining lungs are essentially clear. No pleural effusion is evident. Stable mediastinum with aortic atherosclerosis. Mild cardiomegaly. No new osseous findings. IMPRESSION: 1. Interstitial and patchy peripheral ground-glass opacities on the left-greater-than-right possibly a combination of chronic change and superimposed infection. Overall aeration seems unchanged. 2. Right upper lobe apical lung mass. 3. Aortic atherosclerosis. Electronically Signed   By: Almira Bar M.D.   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 No fever, leukocytosis overnight; will hold off on starting antibiotic at this time, but given this new supplemental O2 requirement, this patient is not medically stable for discharge.  -orthostatic vitals today with abdominal binder.  -Strict ins and out -Monitor supplemental O2 now on intermittent 2L supp O2 -Sat goals 89-93% -Encourage mobility and incentive spirometry  AKI on CKD4 Sub nephrotic proteinuria Normocytic anemia, stable No major changes since the transition to oral toresemide. Nephrology has signed off. -RFP and magnesium in the AM -Discontinue ACEi indefinitely -Strict in and outs -Outpatient follow up with Dr. Ronalee Belts at [facility] -Avoid oral sodium phosphate and magnesium citrate based laxatives / bowel preps  -Baclofen should be avoided  -Monitor K; now on renal diet  RUL mass  Lingular nodule, 1.3 cm Concern for primary malignancy 4.1 x 2.9 x 7.2 cm concerning for primary lung malignancy seen on HD CT scan of the chest. Pulmonology recommends PET CT with likely bronchoalveolar lavage to better assess mass.  -PET CT; outpatient work up per pulmonary  DM -SSI insulin  Hypertension CAD -Holding lisinopril indefinitely -May resume home amlodipine and Coreg at discharge -Continue Lipitor and ASA  BPH -Continue to hold medications in setting of orthostasis -Monitor for signs of LUTS  Diet: Renal diet VTE: Enoxaparin Code: DNR/DNI PT/OT recs: HH PT Family Update:  Dispo: Anticipated discharge to Home in 1 days pending clinical improvement.   [name], MD Internal Medicine  Resident PGY-2 Please contact the on call pager after 5 pm and on weekends at [phone].

## 2022-12-02 DIAGNOSIS — J84112 Idiopathic pulmonary fibrosis: Secondary | ICD-10-CM

## 2022-12-02 DIAGNOSIS — J679 Hypersensitivity pneumonitis due to unspecified organic dust: Secondary | ICD-10-CM

## 2022-12-02 DIAGNOSIS — J9601 Acute respiratory failure with hypoxia: Secondary | ICD-10-CM | POA: Diagnosis not present

## 2022-12-02 DIAGNOSIS — I5030 Unspecified diastolic (congestive) heart failure: Secondary | ICD-10-CM | POA: Diagnosis not present

## 2022-12-02 LAB — CBC WITH DIFFERENTIAL/PLATELET
Abs Immature Granulocytes: 0.06 10*3/uL (ref 0.00–0.07)
Basophils Absolute: 0.1 10*3/uL (ref 0.0–0.1)
Basophils Relative: 1 %
Eosinophils Absolute: 0.5 10*3/uL (ref 0.0–0.5)
Eosinophils Relative: 5 %
HCT: 29.6 % — ABNORMAL LOW (ref 39.0–52.0)
Hemoglobin: 9.8 g/dL — ABNORMAL LOW (ref 13.0–17.0)
Immature Granulocytes: 1 %
Lymphocytes Relative: 23 %
Lymphs Abs: 2.3 10*3/uL (ref 0.7–4.0)
MCH: 30.6 pg (ref 26.0–34.0)
MCHC: 33.1 g/dL (ref 30.0–36.0)
MCV: 92.5 fL (ref 80.0–100.0)
Monocytes Absolute: 0.8 10*3/uL (ref 0.1–1.0)
Monocytes Relative: 8 %
Neutro Abs: 6.1 10*3/uL (ref 1.7–7.7)
Neutrophils Relative %: 62 %
Platelets: 237 10*3/uL (ref 150–400)
RBC: 3.2 MIL/uL — ABNORMAL LOW (ref 4.22–5.81)
RDW: 14 % (ref 11.5–15.5)
WBC: 9.7 10*3/uL (ref 4.0–10.5)
nRBC: 0 % (ref 0.0–0.2)

## 2022-12-02 LAB — MAGNESIUM: Magnesium: 2.3 mg/dL (ref 1.7–2.4)

## 2022-12-02 LAB — RENAL FUNCTION PANEL
Albumin: 2.8 g/dL — ABNORMAL LOW (ref 3.5–5.0)
Anion gap: 12 (ref 5–15)
BUN: 40 mg/dL — ABNORMAL HIGH (ref 8–23)
CO2: 25 mmol/L (ref 22–32)
Calcium: 8.8 mg/dL — ABNORMAL LOW (ref 8.9–10.3)
Chloride: 101 mmol/L (ref 98–111)
Creatinine, Ser: 3.14 mg/dL — ABNORMAL HIGH (ref 0.61–1.24)
GFR, Estimated: 19 mL/min — ABNORMAL LOW (ref >60)
Glucose, Bld: 151 mg/dL — ABNORMAL HIGH (ref 70–99)
Phosphorus: 4.1 mg/dL (ref 2.5–4.6)
Potassium: 4 mmol/L (ref 3.5–5.1)
Sodium: 138 mmol/L (ref 135–145)

## 2022-12-02 LAB — PROCALCITONIN: Procalcitonin: 0.1 ng/mL

## 2022-12-02 LAB — GLUCOSE, CAPILLARY: Glucose-Capillary: 140 mg/dL — ABNORMAL HIGH (ref 70–99)

## 2022-12-02 MED ORDER — RIVAROXABAN 10 MG PO TABS
10.0000 mg | ORAL_TABLET | Freq: Every day | ORAL | Status: DC
  Administered 2022-12-02 – 2022-12-03 (×2): 10 mg via ORAL
  Filled 2022-12-02 (×2): qty 1

## 2022-12-02 MED ORDER — MIDODRINE HCL 5 MG PO TABS
5.0000 mg | ORAL_TABLET | Freq: Two times a day (BID) | ORAL | Status: DC
  Administered 2022-12-02 – 2022-12-03 (×2): 5 mg via ORAL
  Filled 2022-12-02 (×2): qty 1

## 2022-12-02 NOTE — Progress Notes (Signed)
PHARMACY - ANTICOAGULATION CONSULT NOTE  Pharmacy Consult for commencing rivaroxaban 10 mg by mouth, once-daily. Indication: VTE prophylaxis  Allergies  Allergen Reactions   Amlodipine Swelling    Patient report facial swelling at high doses   Penicillins Other (See Comments)    Possibly rash - not sure Has patient had a PCN reaction causing immediate rash, facial/tongue/throat swelling, SOB or lightheadedness with hypotension:unknown Has patient had a PCN reaction causing severe rash involving mucus membranes or skin necrosis: Unknown Has patient had a PCN reaction that required hospitalization: Unknown Has patient had a PCN reaction occurring within the last 10 years: No Childhood reaction If all of the above answers are "NO", then may proceed with Cephalosporin use.    Hydralazine Other (See Comments)    Dizziness    Patient Measurements: Height: 5\' 9"  (175.3 cm) Weight: 84.9 kg (187 lb 3.2 oz) (Scale A) IBW/kg (Calculated) : 70.7  Vital Signs: Temp: 97.7 F (36.5 C) (11/04 0852) Temp Source: Oral (11/04 0852) BP: 122/69 (11/04 0852) Pulse Rate: 99 (11/04 0852)  Labs: Recent Labs    11/30/22 0711 12/01/22 0234 12/02/22 0217  HGB  --  9.4* 9.8*  HCT  --  29.8* 29.6*  PLT  --  228 237  CREATININE 3.39* 3.49* 3.14*    Estimated Creatinine Clearance: 20.6 mL/min (A) (by C-G formula based on SCr of 3.14 mg/dL (H)).   Medical History: Past Medical History:  Diagnosis Date   Arrhythmia    Arthritis    Cataract    bilateral cataract extraction with intraoccular lens implants   Chest pain, unspecified    Chronic airway obstruction, not elsewhere classified    PT. DENIES HE HAS COPD   CKD (chronic kidney disease)    Colon polyps    Adenomatous Polyps 2007   Depression    Diabetes mellitus    Diverticulosis    Gout    Heart murmur    History of kidney stones    Hyperlipemia    Hypertension    Nephrolithiasis    Normocytic anemia 04/23/2017   Obstructive  sleep apnea (adult) (pediatric)    Other and unspecified hyperlipidemia    Other left bundle branch block    Retinal vascular occlusion, unspecified    right   Shortness of breath    on exertion   Unspecified sleep apnea    Vision loss of right eye    White coat syndrome with diagnosis of hypertension 09/19/2017    Assessment: Asked to commence rivaroxaban 10 mg PO every day. Based upon current Scr, and CrCl of ~ 20 mL/min, for the indication of prophylaxis against VTE for medically ill patients, this is an appropriate dose and interval.  Plan:  Rivaroxaban 10 mg by mouth, every day.   Elicia Lamp, PharmD, CPP 12/02/2022,9:22 AM

## 2022-12-02 NOTE — Progress Notes (Signed)
Physical Therapy Treatment Patient Details Name: Jeffery Fernandez MRN: [id number] DOB: 1945/05/30 Today's Date: 12/02/2022   History of Present Illness 77 y.o. presented with SOB and admitted for Acute hypoxic respiratory failure on 10/27. Pt with HFpEF. PMH of HTN ,OSA ,T2DM CKD III and Chronic back pain.    PT Comments  Patient progressing with ambulation distance and tolerance.  Still with SBP dropping 140-114 supine to sit wearing knee hi compression socks and applying abdominal binder in sitting but pt asymptomatic.  Patient eager for home and hopes for tomorrow.  PT will follow up if not d/c.    If plan is discharge home, recommend the following: A little help with walking and/or transfers;Help with stairs or ramp for entrance;A little help with bathing/dressing/bathroom;Assistance with cooking/housework   Can travel by private vehicle        Equipment Recommendations  None recommended by PT    Recommendations for Other Services       Precautions / Restrictions Precautions Precautions: Fall Precaution Comments: Watch SpO2 & BP     Mobility  Bed Mobility Overal bed mobility: Modified Independent                  Transfers Overall transfer level: Needs assistance Equipment used: Rolling walker (2 wheels) Transfers: Sit to/from Stand Sit to Stand: Supervision           General transfer comment: applied abdominal binder while EOB; assist for lines    Ambulation/Gait Ambulation/Gait assistance: Supervision, Contact guard assist Gait Distance (Feet): 180 Feet Assistive device: Rolling walker (2 wheels) Gait Pattern/deviations: Step-through pattern, Decreased stride length       General Gait Details: ambulating wearing compression socks and binder, SBP did drop supine>sitting 140>114, but pt asymptomatic   Stairs             Wheelchair Mobility     Tilt Bed    Modified Rankin (Stroke Patients Only)       Balance Overall balance assessment:  Needs assistance   Sitting balance-Leahy Scale: Good       Standing balance-Leahy Scale: Fair                              Cognition Arousal: Alert Behavior During Therapy: WFL for tasks assessed/performed Overall Cognitive Status: Within Functional Limits for tasks assessed                                          Exercises Other Exercises Other Exercises: sit<>stand x 5 with intermittent UE support with CGA Other Exercises: standing heel raises x 10 with UE support    General Comments General comments (skin integrity, edema, etc.): on 2L O2 with ambulation SpO2 dropped 83% increased to 3L O2 and up to 90's with 1.5 min seated rest      Pertinent Vitals/Pain Pain Assessment Pain Assessment: No/denies pain    Home Living                          Prior Function            PT Goals (current goals can now be found in the care plan section) Progress towards PT goals: Progressing toward goals    Frequency    Min 1X/week      PT Plan  Co-evaluation              AM-PAC PT "6 Clicks" Mobility   Outcome Measure  Help needed turning from your back to your side while in a flat bed without using bedrails?: None Help needed moving from lying on your back to sitting on the side of a flat bed without using bedrails?: None Help needed moving to and from a bed to a chair (including a wheelchair)?: A Little Help needed standing up from a chair using your arms (e.g., wheelchair or bedside chair)?: A Little Help needed to walk in hospital room?: A Little Help needed climbing 3-5 steps with a railing? : Total 6 Click Score: 18    End of Session Equipment Utilized During Treatment: Gait belt;Oxygen Activity Tolerance: Patient tolerated treatment well Patient left: in bed;with call bell/phone within reach;with family/visitor present   PT Visit Diagnosis: Muscle weakness (generalized) (M62.81);Difficulty in walking, not  elsewhere classified (R26.2)     Time: 3474-2595 PT Time Calculation (min) (ACUTE ONLY): 21 min  Charges:    $Gait Training: 8-22 mins PT General Charges $$ ACUTE PT VISIT: 1 Visit                     Sheran Lawless, PT Acute Rehabilitation Services [phone] 12/02/2022    Elray Mcgregor 12/02/2022, 5:31 PM

## 2022-12-02 NOTE — Plan of Care (Signed)

## 2022-12-02 NOTE — Progress Notes (Signed)
Orthostatic Vitals  Lying: 138/76 (95 map) and 89 Pulse  Sitting: 121/70 (86 map) and 94 pulse Sitting after 3 min: 139/87 (103 map) and 97  pulse Standing: 108/63 (78 map) and 105 pulse Standing after 3 min: 107/74 (86 map) and 111 pulse   Patient tolerated well, repositioned in chair to eat lunch after finishing.

## 2022-12-02 NOTE — Progress Notes (Signed)
SATURATION QUALIFICATIONS: (This note is used to comply with regulatory documentation for home oxygen)  Patient Saturations on Room Air at Rest = 86%  Patient Saturations on Room Air while Ambulating = 80%  Patient Saturations on 2 Liters of oxygen while Ambulating = 89%  Patient saturations on 3 Liters of oxygen while ambulating = 90-91   Please briefly explain why patient needs home oxygen:  Patient desats without the use of oxygen

## 2022-12-02 NOTE — Plan of Care (Signed)
  Problem: Education: Goal: Knowledge of General Education information will improve Description: Including pain rating scale, medication(s)/side effects and non-pharmacologic comfort measures Outcome: Progressing   Problem: Health Behavior/Discharge Planning: Goal: Ability to manage health-related needs will improve Outcome: Progressing   Problem: Clinical Measurements: Goal: Will remain free from infection Outcome: Progressing   Problem: Activity: Goal: Risk for activity intolerance will decrease Outcome: Progressing   Problem: Nutrition: Goal: Adequate nutrition will be maintained Outcome: Progressing   Problem: Coping: Goal: Level of anxiety will decrease Outcome: Progressing   Problem: Pain Management: Goal: General experience of comfort will improve Outcome: Progressing   Problem: Safety: Goal: Ability to remain free from injury will improve Outcome: Progressing

## 2022-12-02 NOTE — Progress Notes (Signed)
Occupational Therapy Treatment Patient Details Name: Jeffery Fernandez MRN: [id number] DOB: 13-Apr-1945 Today's Date: 12/02/2022   History of present illness 77 y.o. presented with SOB and admitted for Acute hypoxic respiratory failure on 10/27. Pt with HFpEF. PMH of HTN ,OSA ,T2DM CKD III and Chronic back pain.   OT comments  Patient able to complete ADL sinkside from a sit to stand level with setup.  O2 down to 85% on 3.5L, bumped to 4, and HR to 121.  Patient eventually rebounded to 91% and O2 dropped to 3.5L.  Patient is hoping to discharge home soon.  OT can follow if he remains in the acute setting and no post acute OT is anticipated.        If plan is discharge home, recommend the following:  Assist for transportation;Assistance with cooking/housework   Equipment Recommendations  None recommended by OT    Recommendations for Other Services      Precautions / Restrictions Precautions Precautions: Fall Precaution Comments: Watch SpO2 & BP Restrictions Weight Bearing Restrictions: No       Mobility Bed Mobility                 Patient Response: Cooperative  Transfers Overall transfer level: Needs assistance   Transfers: Sit to/from Stand, Bed to chair/wheelchair/BSC Sit to Stand: Modified independent (Device/Increase time)     Step pivot transfers: Supervision           Balance Overall balance assessment: Needs assistance Sitting-balance support: No upper extremity supported, Feet supported Sitting balance-Leahy Scale: Normal     Standing balance support: No upper extremity supported Standing balance-Leahy Scale: Fair                             ADL either performed or assessed with clinical judgement   ADL       Grooming: Wash/dry hands;Wash/dry face;Set up;Sitting;Standing           Upper Body Dressing : Independent;Sitting   Lower Body Dressing: Set up;Sit to/from stand   Toilet Transfer: Set up;Ambulation;Regular Toilet                   Extremity/Trunk Assessment Upper Extremity Assessment Upper Extremity Assessment: Overall WFL for tasks assessed   Lower Extremity Assessment Lower Extremity Assessment: Defer to PT evaluation   Cervical / Trunk Assessment Cervical / Trunk Assessment: Normal    Vision Baseline Vision/History: 1 Wears glasses Patient Visual Report: No change from baseline     Perception Perception Perception: Not tested   Praxis Praxis Praxis: Not tested    Cognition Arousal: Alert Behavior During Therapy: WFL for tasks assessed/performed Overall Cognitive Status: Within Functional Limits for tasks assessed                                                       General Comments      Pertinent Vitals/ Pain       Pain Assessment Pain Assessment: No/denies pain  Home Living Family/patient expects to be discharged to:: Private residence Living Arrangements: Spouse/significant other                                      Prior Functioning/Environment  Frequency  Min 1X/week        Progress Toward Goals  OT Goals(current goals can now be found in the care plan section)  Progress towards OT goals: Progressing toward goals  Acute Rehab OT Goals OT Goal Formulation: With patient Time For Goal Achievement: 12/10/22 Potential to Achieve Goals: Good  Plan      Co-evaluation                 AM-PAC OT "6 Clicks" Daily Activity     Outcome Measure   Help from another person eating meals?: None Help from another person taking care of personal grooming?: None Help from another person toileting, which includes using toliet, bedpan, or urinal?: A Little Help from another person bathing (including washing, rinsing, drying)?: A Little Help from another person to put on and taking off regular upper body clothing?: None Help from another person to put on and taking off regular lower body clothing?: A  Little 6 Click Score: 21    End of Session    OT Visit Diagnosis: Muscle weakness (generalized) (M62.81)   Activity Tolerance Patient tolerated treatment well   Patient Left in chair;with call bell/phone within reach;with family/visitor present   Nurse Communication Mobility status        Time: 0820-0850 OT Time Calculation (min): 30 min  Charges: OT General Charges $OT Visit: 1 Visit OT Treatments $Self Care/Home Management : 23-37 mins  12/02/2022  RP, OTR/L  Acute Rehabilitation Services  Office:  [phone]   Suzanna Obey 12/02/2022, 8:56 AM

## 2022-12-02 NOTE — Telephone Encounter (Signed)
Called and got daughter. We only have the first week in Jan avail for [place]/Icard. Daughter said that is too far out and unacceptable. Please advise.

## 2022-12-02 NOTE — Progress Notes (Addendum)
HD#7 SUBJECTIVE:  Patient Summary: Jeffery Fernandez is a 77 y.o. with a pertinent PMH of HTN ,OSA ,T2DM CKD III and Chronic back pain, who presented with SOB  and admitted for Acute hypoxic respiratory failure secondary due to acute HFpEF excerebration and found to ILD and pulmonary nodules concerning for malignancy and also acute on chronic kidney disease on torsemide .  Overnight Events:  No events overnight . Remained on 4L [place]   Interim History: Patient laying in bed with her daughter at bedside. Pt said he " feels good ". He reported an ongoing cough which is not new. He otherwise didn't have any complaint overnight. Daughter's question adequately answered and all concerns addressed.  OBJECTIVE:  Vital Signs: Vitals:   12/01/22 1928 12/01/22 2335 12/02/22 0437 12/02/22 0852  BP: (!) 140/72 (!) 140/74 (!) 153/77 122/69  Pulse: 86 88 (!) 103 99  Resp: 17 20 20 15   Temp: 98.2 F (36.8 C) 98.1 F (36.7 C) 99.3 F (37.4 C) 97.7 F (36.5 C)  TempSrc: Oral Oral Axillary Oral  SpO2: 92% 93% 91% 90%  Weight:   84.9 kg   Height:       Supplemental O2: HFNC SpO2: 90 % O2 Flow Rate (L/min): 4 L/min  Filed Weights   11/29/22 0305 12/01/22 0335 12/02/22 0437  Weight: 84.8 kg 85.4 kg 84.9 kg     Intake/Output Summary (Last 24 hours) at 12/02/2022 0928 Last data filed at 12/02/2022 0753 Gross per 24 hour  Intake 360 ml  Output 450 ml  Net -90 ml   Net IO Since Admission: -3,898.53 mL [12/02/22 0928]  Physical Exam:  Physical Exam Constitutional:      Comments: Laying in bed, in no acute distress  Cardiovascular:     Rate and Rhythm: Normal rate.     Comments: Improved crackles Pulmonary:     Comments: Mild crackles noted at Lower Left lobes bilaterally but significantly improved compared to before  Abdominal:     Palpations: Abdomen is soft.  Musculoskeletal:     Right lower leg: No edema.     Left lower leg: No edema.  Skin:    General: Skin is warm and dry.   Neurological:     General: No focal deficit present.     Mental Status: He is alert.  Psychiatric:        Mood and Affect: Mood normal.        Behavior: Behavior normal.     Patient Lines/Drains/Airways Status     Active Line/Drains/Airways     Name Placement date Placement time Site Days   Peripheral IV 11/24/22 20 G Left Antecubital 11/24/22  1343  Antecubital  1             ASSESSMENT/PLAN:  Assessment: Principal Problem:   Acute hypoxic respiratory failure (HCC) Active Problems:   Acute heart failure with preserved ejection fraction (HCC)   Acute renal failure superimposed on stage 4 chronic kidney disease (HCC)   Hypoxia   Plan: #Acute Hypoxic Respiratory Failure  2/2 HFpEF excerebration  #Pulmonary Nodule #ILD Patient continue to require 4L of Oxygen .Remains afebrile and WBC is WNL.CXR done on 12/01/2022 showed Interstitial and patchy peripheral ground-glass opacities on the left-greater-than-right possibly a combination of chronic change and superimposed infection. Wondered  if there is a new  superimposed infection that  is contributing to his persistent hypoxia however Pro-calcitonin came back as  < 0.10 indicating low likelihood for bacteria infection. Holding off antibiotics.  Patient remains orthostatic today and his BP remains very soft in the 107/74. Plan is to monitor him today and potentially discharge home  tomorrow with home oxygen and have him follow up with Pulmonary outpatient.  - Wean off oxygen as tolerated - keep Saturation goals of 89-92% - Encourage OOB with mobility specialist  - Continue to encourage use of IS  - Start Midodrine 5 mg twice daily - Potential discharge tomorrow if patient doesn't have positive orthostatics with midodrine and compression stockings.   #History Of Anemia  Stable  - Daily CBC  #Hx Chronic Kidney Disease IV Gfr Stable. Continuing torsemide 40 mg. Nephrology signed off. Plan is to follow up with Dr Jeffery Fernandez at  [facility]   #Hx of BPH - Holding Terazosin   #Hx of OSA - Nightly CPAP as needed  #Hx of Diabetes  Blood glucose is been at goal during this hospitalization.  - Continues Glucose monitoring   #Hx of HTN #Hx of HLD - Continue to Hold Amlodipine and monitor vitals  - Continue home Lipitor 40 mg  - Continue ASA 81 mg  #Hx of Depression - Continue home Sertraline  - Continue Bupropion 150 mg  #Hx of chronic low back pain - Tylenol 500 mg as needed   Best Practice: Diet: Renal diet VTE: rivaroxaban (XARELTO) tablet 10 mg Start: 12/02/22 1015 Code: DNR Family Contact: Wife, at bedside. DISPO: Anticipated discharge in 1 days to Home pending Medical work up and treatment   Signature: Jeffery Fernandez , MD Internal Medicine Resident, PGY-1 [facility]  Pager: [phone] 9:28 AM, 12/02/2022   Please contact the on call pager after 5 pm and on weekends at [phone].

## 2022-12-03 ENCOUNTER — Other Ambulatory Visit (HOSPITAL_COMMUNITY): Payer: Self-pay

## 2022-12-03 DIAGNOSIS — Z515 Encounter for palliative care: Secondary | ICD-10-CM

## 2022-12-03 DIAGNOSIS — J84112 Idiopathic pulmonary fibrosis: Secondary | ICD-10-CM | POA: Diagnosis not present

## 2022-12-03 DIAGNOSIS — J679 Hypersensitivity pneumonitis due to unspecified organic dust: Secondary | ICD-10-CM | POA: Diagnosis not present

## 2022-12-03 DIAGNOSIS — I5030 Unspecified diastolic (congestive) heart failure: Secondary | ICD-10-CM | POA: Diagnosis not present

## 2022-12-03 DIAGNOSIS — J9601 Acute respiratory failure with hypoxia: Secondary | ICD-10-CM | POA: Diagnosis not present

## 2022-12-03 DIAGNOSIS — R911 Solitary pulmonary nodule: Secondary | ICD-10-CM

## 2022-12-03 LAB — CBC
HCT: 30.4 % — ABNORMAL LOW (ref 39.0–52.0)
Hemoglobin: 9.9 g/dL — ABNORMAL LOW (ref 13.0–17.0)
MCH: 29.7 pg (ref 26.0–34.0)
MCHC: 32.6 g/dL (ref 30.0–36.0)
MCV: 91.3 fL (ref 80.0–100.0)
Platelets: 251 10*3/uL (ref 150–400)
RBC: 3.33 MIL/uL — ABNORMAL LOW (ref 4.22–5.81)
RDW: 13.8 % (ref 11.5–15.5)
WBC: 11.3 10*3/uL — ABNORMAL HIGH (ref 4.0–10.5)
nRBC: 0 % (ref 0.0–0.2)

## 2022-12-03 LAB — GLUCOSE, CAPILLARY: Glucose-Capillary: 152 mg/dL — ABNORMAL HIGH (ref 70–99)

## 2022-12-03 LAB — RENAL FUNCTION PANEL
Albumin: 2.8 g/dL — ABNORMAL LOW (ref 3.5–5.0)
Anion gap: 10 (ref 5–15)
BUN: 40 mg/dL — ABNORMAL HIGH (ref 8–23)
CO2: 26 mmol/L (ref 22–32)
Calcium: 8.8 mg/dL — ABNORMAL LOW (ref 8.9–10.3)
Chloride: 103 mmol/L (ref 98–111)
Creatinine, Ser: 3.33 mg/dL — ABNORMAL HIGH (ref 0.61–1.24)
GFR, Estimated: 18 mL/min — ABNORMAL LOW (ref >60)
Glucose, Bld: 173 mg/dL — ABNORMAL HIGH (ref 70–99)
Phosphorus: 4.4 mg/dL (ref 2.5–4.6)
Potassium: 4.6 mmol/L (ref 3.5–5.1)
Sodium: 139 mmol/L (ref 135–145)

## 2022-12-03 MED ORDER — TORSEMIDE 20 MG PO TABS
40.0000 mg | ORAL_TABLET | Freq: Every day | ORAL | 0 refills | Status: DC
  Filled 2022-12-03: qty 60, 30d supply, fill #0

## 2022-12-03 MED ORDER — MIDODRINE HCL 5 MG PO TABS
5.0000 mg | ORAL_TABLET | Freq: Two times a day (BID) | ORAL | 0 refills | Status: AC
  Filled 2022-12-03: qty 60, 30d supply, fill #0

## 2022-12-03 NOTE — Telephone Encounter (Signed)
Will await Dr. Olalere's response. 

## 2022-12-03 NOTE — Telephone Encounter (Signed)
Hey this PET scan order is wrong this is for a super D Ct done in [facility] it needs to be a  [id number] - NM PET IMAGE INITIAL (PI) SKULL BASE TO THIGH (F-18 FDG) if this is an actual Pet then this is what it needs to be ordered

## 2022-12-03 NOTE — Discharge Summary (Signed)
Name: Jeffery Fernandez MRN: [id number] DOB: September 06, 1945 77 y.o. PCP: [name], MD  Date of Admission: 11/24/2022 12:35 PM Date of Discharge: 12/03/2022 4:04 PM Attending Physician: Dr. Heide Spark  Discharge Diagnosis: Principal Problem:   Acute hypoxic respiratory failure (HCC) Active Problems:   Acute heart failure with preserved ejection fraction (HCC)   Acute renal failure superimposed on stage 4 chronic kidney disease (HCC)   Hypoxia    Discharge Medications: Allergies as of 12/03/2022       Reactions   Amlodipine Swelling   Patient report facial swelling at high doses   Penicillins Other (See Comments)   Possibly rash - not sure Has patient had a PCN reaction causing immediate rash, facial/tongue/throat swelling, SOB or lightheadedness with hypotension:unknown Has patient had a PCN reaction causing severe rash involving mucus membranes or skin necrosis: Unknown Has patient had a PCN reaction that required hospitalization: Unknown Has patient had a PCN reaction occurring within the last 10 years: No Childhood reaction If all of the above answers are "NO", then may proceed with Cephalosporin use.   Hydralazine Other (See Comments)   Dizziness        Medication List     STOP taking these medications    amLODipine 5 MG tablet Commonly known as: NORVASC   carvedilol 25 MG tablet Commonly known as: COREG   HYDROcodone-acetaminophen 5-325 MG tablet Commonly known as: NORCO/VICODIN   lisinopril 40 MG tablet Commonly known as: ZESTRIL   terazosin 5 MG capsule Commonly known as: HYTRIN       TAKE these medications    acetaminophen 500 MG tablet Commonly known as: TYLENOL Take 1,000 mg by mouth every 6 (six) hours as needed (for pain.).   aspirin EC 81 MG tablet Take 81 mg by mouth daily. Swallow whole.   atorvastatin 40 MG tablet Commonly known as: LIPITOR Take 40 mg by mouth at bedtime.   buPROPion 150 MG 24 hr tablet Commonly known as: WELLBUTRIN  XL Take 150 mg by mouth every morning.   gabapentin 100 MG capsule Commonly known as: NEURONTIN Take 100 mg by mouth 3 (three) times daily. 100mg  in the morning and evening. 200mg  at night   insulin glargine 100 UNIT/ML injection Commonly known as: LANTUS Inject 40 Units into the skin daily.   midodrine 5 MG tablet Commonly known as: PROAMATINE Take 1 tablet (5 mg total) by mouth 2 (two) times daily with a meal.   QC Tumeric Complex 500 MG Caps Generic drug: Turmeric Take 2,400 mg by mouth in the morning and at bedtime.   sertraline 100 MG tablet Commonly known as: ZOLOFT Take 100 mg by mouth daily.   torsemide 20 MG tablet Commonly known as: DEMADEX Take 2 tablets (40 mg total) by mouth daily. Start taking on: December 04, 2022   Trulicity 1.5 MG/0.5ML Soaj Generic drug: Dulaglutide Inject 1.5 mg into the skin daily. Takes on Thursdays               Durable Medical Equipment  (From admission, onward)           Start     Ordered   12/02/22 1300  For home use only DME oxygen  Once       Question Answer Comment  Length of Need 6 Months   Mode or (Route) Nasal cannula   Liters per Minute 4   Frequency Continuous (stationary and portable oxygen unit needed)   Oxygen delivery system Gas  12/02/22 1259   11/30/22 1347  For home use only DME oxygen  Once       Question Answer Comment  Length of Need 6 Months   Mode or (Route) Nasal cannula   Liters per Minute 2   Frequency Continuous (stationary and portable oxygen unit needed)   Oxygen delivery system Gas      11/30/22 1347            Disposition and follow-up:    [name] was discharged from [facility] in Stable condition.  At the hospital follow up visit please address:  1.  Follow-up: HFpEF   - Discharged on 4 L home oxygen.   - PLEASE assess his continual need for oxygen and titrate as needed             Interstitial lung disease           Right Lung Mass             Left lung nodule   - CT showed Right upper lobe mass measuring up to 4.1 x 2.9 x 7.2 cm,concerning for primary lung malignancy,Solid pulmonary nodule of the lingula measuring 1.3 cm, possibly due to focal fibrosis and Bilateral reticular and ground-glass opacities with subpleural and peri-broncho vascular involvement and no clear craniocaudal predominance.  - Follow up with a PET-CT and/or tissue sampling for further evaluation set for 12/06/22 . Kindly follow up on findings and refer appropriately if needed    Hx of Chronic Kidney disease stage 4  - Initiated on Torsemide but held due to hypotension  - Please evaluate to determine the need to restart at an appropriate time   - Kindly trend GRF and Cr and make the neccessary follow up with Nephrologist for appropriate intervention    Hx of BPH   - On home terazosin but was discontinued due to positive orthostatic signs.   - Please evaluate signs of BPH and orthostatic vital and determine when its appropriate  2.  Labs / imaging needed at time of follow-up: CBC,CMP, A1c  3.  Pending labs/ test needing follow-up: N/A  4.  Medication Changes  STOPPED  -  Lisinopril   -  Amlodipine  -  Carvedilol  - Hydrocodone-acetaminophen   - Terazosin   ADDED  - Home oxygen 4L    MODIFIED  - Held Torsemide until seen seen at the follow up    Follow-up Appointments:  Follow-up Information     [facility] Follow up.   Specialty: Home Health Services Contact information: [address] [place] [address] [phone]         Maxie Barb, MD. Call in 2 week(s).   Specialties: Nephrology, Internal Medicine Why: We will call with appointment details Contact information: [address] [place] [address] [phone]         [facility] Follow up.   Why: home oxygen Contact information: [phone]                Hospital Course by problem list:  #Acute Hypoxic Respiratory  Failure likely 2/2 HFpEF Excerebration Patient with a history of CKD IV , HTN ,HLD ,Depression and chronic lower back pain presented due to a worsening SOB and was placed on HFNC. CXR on 11/24/2022 showed Cardiomegaly and diffuse interstitial pattern that reflected pulmonary edema and peripheral midlung densities and atelectasis.He was given lasix and albumin that improved his hypoxia difficultly from requiring 11 L  to 5 L with 98% saturations.An ECHO done on 11/25/2022 showed showed LVEF of 55-60,mild concentric hypertrophy of LV.Patient receive a diagnosis of heart failure with preserve ejection fraction at this time. 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  There was also evidence of mildly enlarged mediastinal and right hilar lymph nodes as well as solid pulmonary nodule at the lingula measuring 1.3 cm.  Patient was recommended to follow-up with outpatient pulmonology for this new CT findings. Patient continued to have positive orthostatics and decompensated requiring increased oxygen required from 2 L to 5 L.The patient was  started on midodrine 5 mg BID for borderline hypotension. He later go weaned down to 3L sating at 91 %. Patient was then discharged home with  4L home oxygen.  #Hx Chronic Kidney Disease IV Hx of CKD IV secondary to Kidney Disease, GFR of (22-24). At baseline. Presented for acute execration of HEFpEF.Nephrology was consulted who recommended addition Laxis with albumin.Patient improved significantly after Laxis and albumin. Patient is scheduled to follow up with Dr Ronalee Belts at [facility]    #Hx of BPH Patients terazosin was held during this hospitalization due to positive orthostatic signs .    #Hx of HTN Patients blood pressure was persistently low during this hospitalization especially during orthostatics. He also had an acute on chronic kidney injury so we  stopped his Lisinopril.  #Hx of HLD - Continue Amlodipine. Hold Lisinopril - Continue home Lipitor 40 mg  - Continue ASA 81 mg   #Hx of Depression  Patient was continued on home Sertraline and Bupropion  #Hx of chronic low back pain -Patient was continued on home Tylenol 500 mg as needed    Discharge Subjective: Patient is laying in bed, in no acute distress , with daughter at bedside. Reports no concerns , denies any chest pain, dizziness and is not using accessory muscles to breath. He is happy about the care he has received so far and expresses his outmost gratitude to the medical team. Pt is agreeable with the plan to discharge on home oxygen.   Discharge Exam:   Blood pressure 136/76, pulse 86, temperature 98 F (36.7 C), temperature source Oral, resp. rate (!) 21, height 5\' 9"  (1.753 m), weight 84.7 kg, SpO2 92%.   Constitutional:well-appearing man  sitting in bed, in no acute distress Cardiovascular: RRR Pulmonary/Chest: normal work of breathing on room air, lungs clear to auscultation bilaterally. Absent crackles  Abdominal: soft, non-tender, non-distended. No fluid wave no asterixis Neurological: alert & oriented x 3 MSK: no gross abnormalities. No pitting edema Skin: warm and dry Psych: Normal mood and affect  Pertinent Labs, Studies, and Procedures:     Latest Ref Rng & Units 12/03/2022    2:45 AM 12/02/2022    2:17 AM 12/01/2022    2:34 AM  CBC  WBC 4.0 - 10.5 K/uL 11.3  9.7  8.5   Hemoglobin 13.0 - 17.0 g/dL 9.9  9.8  9.4   Hematocrit 39.0 - 52.0 % 30.4  29.6  29.8   Platelets 150 - 400 K/uL 251  237  228        Latest Ref Rng & Units 12/03/2022    2:45 AM 12/02/2022    2:17 AM 12/01/2022    2:34 AM  CMP  Glucose 70 - 99 mg/dL 161  096  045   BUN 8 - 23 mg/dL 40  40  41   Creatinine 0.61 - 1.24 mg/dL 4.09  8.11  9.14   Sodium 135 - 145 mmol/L 139  138  139   Potassium 3.5 - 5.1 mmol/L 4.6  4.0  3.9   Chloride 98 - 111 mmol/L 103  101  102   CO2 22 - 32  mmol/L 26  25  26    Calcium 8.9 - 10.3 mg/dL 8.8  8.8  8.9     ECHOCARDIOGRAM COMPLETE  Result Date: 11/25/2022    ECHOCARDIOGRAM REPORT   Patient Name:   NASHTON BELSON Date of Exam: 11/25/2022 Medical Rec #:  [id number]     Height:       69.0 in Accession #:    [id number]    Weight:       192.9 lb Date of Birth:  1945/11/13      BSA:          2.035 m Patient Age:    77 years      BP:           127/84 mmHg Patient Gender: M             HR:           85 bpm. Exam Location:  Inpatient Procedure: 2D Echo, Cardiac Doppler, Color Doppler and Intracardiac            Opacification Agent Indications:    Dyspnea  History:        Patient has prior history of Echocardiogram examinations, most                 recent 06/20/2010. Arrythmias:LBBB.  Sonographer:    Karma Ganja Referring Phys: [id number] [facility]  Sonographer Comments: Technically difficult study due to poor echo windows. Image acquisition challenging due to COPD. IMPRESSIONS  1. Technically difficult study.  2. Left ventricular ejection fraction, by estimation, is 55 to 60%. The left ventricle has normal function. The left ventricle has no regional wall motion abnormalities. There is mild concentric left ventricular hypertrophy. Left ventricular diastolic parameters are consistent with Grade I diastolic dysfunction (impaired relaxation).  3. Right ventricular systolic function is normal. The right ventricular size is normal.  4. The mitral valve is grossly normal. No evidence of mitral valve regurgitation. No evidence of mitral stenosis.  5. The aortic valve is normal in structure. Aortic valve regurgitation is not visualized. No aortic stenosis is present.  6. The inferior vena cava is normal in size with greater than 50% respiratory variability, suggesting right atrial pressure of 3 mmHg. FINDINGS  Left Ventricle: Left ventricular ejection fraction, by estimation, is 55 to 60%. The left ventricle has normal function. The left ventricle has no  regional wall motion abnormalities. Definity contrast agent was given IV to delineate the left ventricular  endocardial borders. The left ventricular internal cavity size was normal in size. There is mild concentric left ventricular hypertrophy. Abnormal (paradoxical) septal motion, consistent with left bundle branch block. Left ventricular diastolic parameters are consistent with Grade I diastolic dysfunction (impaired relaxation). Right Ventricle: The right ventricular size is normal. No increase in right ventricular wall thickness. Right ventricular systolic function is normal. Left Atrium: Left atrial size was normal in size. Right Atrium: Right atrial size was normal in size. Pericardium: There is no evidence of pericardial effusion. Mitral Valve: The mitral valve is grossly normal. No evidence of mitral valve regurgitation. No evidence of mitral valve stenosis. Tricuspid Valve: The tricuspid valve is grossly normal. Tricuspid valve  regurgitation is not demonstrated. No evidence of tricuspid stenosis. Aortic Valve: The aortic valve is normal in structure. Aortic valve regurgitation is not visualized. No aortic stenosis is present. Aortic valve mean gradient measures 5.0 mmHg. Aortic valve peak gradient measures 10.1 mmHg. Aortic valve area, by VTI measures 1.86 cm. Pulmonic Valve: The pulmonic valve was not well visualized. Pulmonic valve regurgitation is not visualized. No evidence of pulmonic stenosis. Aorta: The aortic root is normal in size and structure. Venous: The inferior vena cava is normal in size with greater than 50% respiratory variability, suggesting right atrial pressure of 3 mmHg. IAS/Shunts: No atrial level shunt detected by color flow Doppler.  LEFT VENTRICLE PLAX 2D LVIDd:         5.30 cm      Diastology LVIDs:         4.20 cm      LV e' medial:    5.33 cm/s LV PW:         1.00 cm      LV E/e' medial:  13.0 LV IVS:        1.10 cm      LV e' lateral:   6.42 cm/s LVOT diam:     2.00 cm      LV  E/e' lateral: 10.8 LV SV:         50 LV SV Index:   24 LVOT Area:     3.14 cm  LV Volumes (MOD) LV vol d, MOD A2C: 62.2 ml LV vol d, MOD A4C: 114.0 ml LV vol s, MOD A2C: 29.7 ml LV vol s, MOD A4C: 32.1 ml LV SV MOD A2C:     32.5 ml LV SV MOD A4C:     114.0 ml LV SV MOD BP:      54.6 ml LEFT ATRIUM             Index LA diam:        4.10 cm 2.02 cm/m LA Vol (A2C):   42.4 ml 20.84 ml/m LA Vol (A4C):   70.8 ml 34.80 ml/m LA Biplane Vol: 54.3 ml 26.69 ml/m  AORTIC VALVE AV Area (Vmax):    1.84 cm AV Area (Vmean):   1.85 cm AV Area (VTI):     1.86 cm AV Vmax:           159.00 cm/s AV Vmean:          103.000 cm/s AV VTI:            0.267 m AV Peak Grad:      10.1 mmHg AV Mean Grad:      5.0 mmHg LVOT Vmax:         93.00 cm/s LVOT Vmean:        60.600 cm/s LVOT VTI:          0.158 m LVOT/AV VTI ratio: 0.59  AORTA Ao Root diam: 3.40 cm Ao Asc diam:  2.90 cm MITRAL VALVE MV Area (PHT): 3.37 cm     SHUNTS MV Decel Time: 225 msec     Systemic VTI:  0.16 m MV E velocity: 69.20 cm/s   Systemic Diam: 2.00 cm MV A velocity: 131.00 cm/s MV E/A ratio:  0.53 Aditya Sabharwal Electronically signed by Dorthula Nettles Signature Date/Time: 11/25/2022/12:58:20 PM    Final    DG Chest 2 View  Result Date: 11/24/2022 CLINICAL DATA:  Shortness of breath. EXAM: CHEST - 2 VIEW COMPARISON:  One-view chest x-ray 05/30/2020. FINDINGS: The heart is enlarged.  A diffuse interstitial pattern is present with peripheral midlung densities bilaterally. Atherosclerotic changes are present at the aortic arch. The visualized soft tissues and bony thorax are unremarkable. IMPRESSION: 1. Cardiomegaly and diffuse interstitial pattern likely reflects pulmonary edema. 2. Peripheral midlung densities bilaterally likely reflect atelectasis. Electronically Signed   By: Marin Roberts M.D.   On: 11/24/2022 13:55     Discharge Instructions: Discharge Instructions     (HEART FAILURE PATIENTS) Call MD:  Anytime you have any of the following  symptoms: 1) 3 pound weight gain in 24 hours or 5 pounds in 1 week 2) shortness of breath, with or without a dry hacking cough 3) swelling in the hands, feet or stomach 4) if you have to sleep on extra pillows at night in order to breathe.   Complete by: As directed    Amb Referral to Palliative Care   Complete by: As directed    Call MD for:   Complete by: As directed    Call MD for:  difficulty breathing, headache or visual disturbances   Complete by: As directed    Call MD for:  extreme fatigue   Complete by: As directed    Call MD for:  hives   Complete by: As directed    Call MD for:  persistant dizziness or light-headedness   Complete by: As directed    Call MD for:  persistant nausea and vomiting   Complete by: As directed    Call MD for:  redness, tenderness, or signs of infection (pain, swelling, redness, odor or green/yellow discharge around incision site)   Complete by: As directed    Call MD for:  severe uncontrolled pain   Complete by: As directed    Call MD for:  temperature >100.4   Complete by: As directed    Diet - low sodium heart healthy   Complete by: As directed    Discharge instructions   Complete by: As directed    Dear Mr Saraceno, It was a pleasure taking care of you at [facility]. You were admitted for shortness of breath. We are discharging you home now that you are doing better. Please follow the following instructions:   1) You were diagnosed with heart failure with preserved ejection fraction which means that your heart is retaining fluid even though it is pumping adequately. Your breathing improved, but you will still require some supplemental oxygen needs. We removed fluids from your body, which is why your blood pressure was slightly lower when you stand up. For that reason, we have added compression stocking, abdominal binders, and the following changes to your medications:  - Continue using supplemental O2 - your oxygen saturation should be  between 89% -93%  - Pause using Torsemide 40 mg for the next 48 hours  - then, restart this medication; you can stop it if you feel lightheaded or dizzy.  - Start taking midodrine 5 mg every 12 hours for the feeling of lightheadedness  - Please follow up with your primary care physician in one week and with the lung doctors in the next 2 weeks - You should make a follow up appointment with cardiology at their outpatient clinic in the next few weeks   - Call your doctor if have          You have any trouble breathing, including waking up at night or needing more pillows than usual to sleep.         You have wheezing or  chest tightness when resting.         You have a very fast or irregular heartbeat.         You cough more than normal, or cough up frothy or pink saliva.         You feel more tired than normal.         Your weight goes up by 2 or more pounds (1 kilogram) in 1 day, or 4 or more pounds (2 kilograms) in 1 week.         You have more swelling than usual, especially in your feet and ankles or in your belly.  2)  You were found to have a disease called interstitial lung disease, a mass in your right lung , as well as a nodule in the base on your left lung. Lung doctors who saw your in the hospital are concerned that these new masses are concerning for malignancy and this needs to be evaluated further.    - PLEASE follow up with  Dr. Delton Coombes or [facility] in follow-up for repeat imaging  3) You have a history of Chronic Kidney disease stage 4. We stopped your Lisinopril and amlodipine while your were in the hospital   - Do not take your Lisinopril or amlodipine until your follow up with Dr. Ronalee Belts  at [facility].     - Preferred narcotic agents for pain control are hydromorphone, fentanyl, and methadone. Morphine should not be used.     - Baclofen should be avoided    - Avoid oral sodium phosphate and magnesium citrate based laxatives / bowel preps   4)Please stop your  terazosin until you are seen in your primary care doctor's office. Please make an appointment to be seen by your primary care doctor in the next week.  5)Please continue doing physical therapy with the home health agency   Take care,  Dr. Kathleen Lime, MD [name], MD   Increase activity slowly   Complete by: As directed       Signed: Kathleen Lime, MD [facility] - PGY1 Pager: [phone] 12/03/2022, 4:04 PM    Please contact the on call pager after 5 pm and on weekends at [phone].

## 2022-12-03 NOTE — Progress Notes (Signed)
Mobility Specialist Progress Note:   12/03/22 0803  Therapy Vitals  Temp 98.1 F (36.7 C)  Temp Source Oral  Pulse Rate 95  Resp 20  BP 100/63  Patient Position (if appropriate) Sitting  Orthostatic Lying   BP- Lying 150/72  Orthostatic Sitting  BP- Sitting 126/73  Orthostatic Standing at 0 minutes  BP- Standing at 0 minutes 101/58  Oxygen Therapy  SpO2 92 %  Mobility  Activity Ambulated with assistance in hallway  Level of Assistance Standby assist, set-up cues, supervision of patient - no hands on  Assistive Device Front wheel walker  Distance Ambulated (ft) 340 ft  Activity Response Tolerated well  Mobility Referral Yes  $Mobility charge 1 Mobility  Mobility Specialist Start Time (ACUTE ONLY) [id number]  Mobility Specialist Stop Time (ACUTE ONLY) 0857  Mobility Specialist Time Calculation (min) (ACUTE ONLY) 19 min    Pre Mobility: 88 HR,  91% SpO2 During Mobility: 114 HR Post Mobility:  90 HR,  91% SpO2  Received pt in bed having no complaints and agreeable to mobility. Pt was asymptomatic throughout ambulation and returned to room w/o fault. Left in bed w/ call bell in reach and all needs met.   D'Vante Earlene Plater Mobility Specialist Please contact via Special educational needs teacher or Rehab office at [phone]

## 2022-12-03 NOTE — Telephone Encounter (Signed)
PCC's please advise on PET order placed by Dr. Wynona Neat. We are asking for approval to work patient in 11/15 at 11:30am with Dr. Delton Coombes.

## 2022-12-03 NOTE — Progress Notes (Signed)
Brief Palliative Medicine Progress Note:  PMT consult received and chart reviewed.   Noted patient discharging home with [facility] today and will follow up outpatient with Pulmonology. Per Dr. Mickie Bail, no inpatient PMT needs at this time but agrees with outpatient Palliative Care referral for ongoing support and GOC.  Palliative Medicine referral sent to [facility] PMT provider.   1:15 PM Called patient's spouse/Ann - discussed outpatient Palliative Medicine referral - she expressed appreciation.   Thank you for allowing PMT to assist in the care of this patient.  Leoda Smithhart M. Katrinka Blazing [facility] Palliative Medicine Team Team Phone: [phone] NO CHARGE

## 2022-12-03 NOTE — Plan of Care (Signed)

## 2022-12-03 NOTE — TOC Transition Note (Signed)
Transition of Care [facility]) - CM/SW Discharge Note   Patient Details  Name: Jeffery Fernandez MRN: [id number] Date of Birth: 04/19/1945  Transition of Care [facility]) CM/SW Contact:  Leone Haven, RN Phone Number: 12/03/2022, 11:40 AM   Clinical Narrative:    Patient is for possible dc today, daughter is at the bedside and is ok with [employer] supplying the home oxygen.  NCM notified Kandee Keen with [employer] of dc today.   [employer] will deliver oxygen tank to room and set up the concentrator at the home, daughter will be contact to coordinate the oxygen Melven Sartorius [phone].    Final next level of care: Home w Home Health Services Barriers to Discharge: No Barriers Identified   Patient Goals and CMS Choice CMS Medicare.gov Compare Post Acute Care list provided to:: Patient Choice offered to / list presented to : Patient  Discharge Placement                         Discharge Plan and Services Additional resources added to the After Visit Summary for     Discharge Planning Services: CM Consult Post Acute Care Choice: Home Health          DME Arranged: Oxygen DME Agency: [employer] Date DME Agency Contacted: 12/03/22 Time DME Agency Contacted: 1140 Representative spoke with at DME Agency: Vaughan Basta HH Arranged: PT HH Agency: [facility] Date [facility] Agency Contacted: 11/27/22 Time HH Agency Contacted: 1126 Representative spoke with at [facility] Agency: Kandee Keen with [facility] , left message awaiting call back  Social Determinants of Health (SDOH) Interventions SDOH Screenings   Food Insecurity: No Food Insecurity (11/24/2022)  Housing: Low Risk  (11/24/2022)  Transportation Needs: No Transportation Needs (11/24/2022)  Utilities: Not At Risk (11/24/2022)  Depression (PHQ2-9): Low Risk  (09/17/2022)  Financial Resource Strain: Low Risk  (11/13/2022)   Received from [facility]  Physical Activity: Unknown (11/13/2022)   Received from [facility]  Social  Connections: Moderately Integrated (11/13/2022)   Received from [facility]  Stress: No Stress Concern Present (11/13/2022)   Received from [facility]  Tobacco Use: Medium Risk (11/24/2022)     Readmission Risk Interventions    06/09/2020    2:08 PM  Readmission Risk Prevention Plan  Post Dischage Appt Complete  Medication Screening Complete  Transportation Screening Complete

## 2022-12-03 NOTE — Plan of Care (Signed)

## 2022-12-03 NOTE — Telephone Encounter (Signed)
Either way we still can't see this order

## 2022-12-04 NOTE — Telephone Encounter (Signed)
PT's daughter calling again to check to be sure PET scan was sched. Adv we are working on it for her dad. Margie/Chantel please call PT daughter to assure her. Thanks.   [phone]

## 2022-12-04 NOTE — Telephone Encounter (Signed)
Patient's daughter, Teresa(DPR) is aware that we awaiting a response form Dr. Wynona Neat.  Rosey Bath is willing to take patient to any location for PET. She would like PET prior to 11/15 appt if possible.   OA, please advise. Thanks

## 2022-12-05 NOTE — Telephone Encounter (Signed)
We are still waiting on the order Dr Wynona Neat is here today

## 2022-12-05 NOTE — Telephone Encounter (Signed)
Daughter calling again to check on PET scan for her dad.

## 2022-12-06 ENCOUNTER — Telehealth: Payer: Self-pay | Admitting: Pulmonary Disease

## 2022-12-06 ENCOUNTER — Other Ambulatory Visit: Payer: Self-pay | Admitting: Pulmonary Disease

## 2022-12-06 DIAGNOSIS — R222 Localized swelling, mass and lump, trunk: Secondary | ICD-10-CM

## 2022-12-06 NOTE — Telephone Encounter (Signed)
Atc to call pt and daughter, no answer. Order for PET was placed today by Dr.O

## 2022-12-06 NOTE — Telephone Encounter (Addendum)
Jeffery Fernandez got this PET scan scheduled

## 2022-12-06 NOTE — Telephone Encounter (Signed)
PT has appt next week. Chantel Crwford states on Epic chat:  The Pt's Pet is scheduled for 11/18 and she wants the F/U with Br. Bryum before January so working on that.   NFN

## 2022-12-06 NOTE — Telephone Encounter (Signed)
Can we kindly make sure the patient has an appointment to follow-up with Dr. Delton Coombes or [facility] soon after PET scan is obtained  New appointment for lung mass-Will need bronchoscopy

## 2022-12-06 NOTE — Telephone Encounter (Signed)
Pt's daughter is on the phone again asking about this encounter again. Frustrated now and wanted to speak with someone. Asking Margie and Verlon Au in [employer] chat to see if they are avail ands they were not. Adv PT to call back at 2:00 to check progress.

## 2022-12-11 DIAGNOSIS — R918 Other nonspecific abnormal finding of lung field: Secondary | ICD-10-CM | POA: Insufficient documentation

## 2022-12-13 ENCOUNTER — Inpatient Hospital Stay: Payer: HMO | Admitting: Emergency Medicine

## 2022-12-16 ENCOUNTER — Encounter (HOSPITAL_COMMUNITY)
Admission: RE | Admit: 2022-12-16 | Discharge: 2022-12-16 | Disposition: A | Discharge status: Home / Self Care | Payer: HMO | Source: Ambulatory Visit | Attending: Pulmonary Disease | Admitting: Pulmonary Disease

## 2022-12-16 DIAGNOSIS — I2510 Atherosclerotic heart disease of native coronary artery without angina pectoris: Secondary | ICD-10-CM | POA: Insufficient documentation

## 2022-12-16 DIAGNOSIS — I700 Atherosclerosis of aorta: Secondary | ICD-10-CM | POA: Insufficient documentation

## 2022-12-16 DIAGNOSIS — C3411 Malignant neoplasm of upper lobe, right bronchus or lung: Secondary | ICD-10-CM | POA: Diagnosis not present

## 2022-12-16 DIAGNOSIS — C7951 Secondary malignant neoplasm of bone: Secondary | ICD-10-CM | POA: Insufficient documentation

## 2022-12-16 DIAGNOSIS — R590 Localized enlarged lymph nodes: Secondary | ICD-10-CM | POA: Insufficient documentation

## 2022-12-16 DIAGNOSIS — N200 Calculus of kidney: Secondary | ICD-10-CM | POA: Insufficient documentation

## 2022-12-16 DIAGNOSIS — R222 Localized swelling, mass and lump, trunk: Secondary | ICD-10-CM | POA: Diagnosis present

## 2022-12-16 DIAGNOSIS — J849 Interstitial pulmonary disease, unspecified: Secondary | ICD-10-CM | POA: Insufficient documentation

## 2022-12-16 DIAGNOSIS — C770 Secondary and unspecified malignant neoplasm of lymph nodes of head, face and neck: Secondary | ICD-10-CM | POA: Diagnosis not present

## 2022-12-16 DIAGNOSIS — C771 Secondary and unspecified malignant neoplasm of intrathoracic lymph nodes: Secondary | ICD-10-CM | POA: Diagnosis not present

## 2022-12-16 LAB — GLUCOSE, CAPILLARY: Glucose-Capillary: 87 mg/dL (ref 70–99)

## 2022-12-16 MED ORDER — FLUDEOXYGLUCOSE F - 18 (FDG) INJECTION
9.3000 | Freq: Once | INTRAVENOUS | Status: AC
  Administered 2022-12-16: 9.3 via INTRAVENOUS

## 2022-12-17 ENCOUNTER — Telehealth: Payer: Self-pay | Admitting: Pulmonary Disease

## 2022-12-17 NOTE — Telephone Encounter (Signed)
Patient's daughter would like to know the results of her father's PET Scan when results become available.

## 2022-12-18 ENCOUNTER — Telehealth: Payer: Self-pay | Admitting: Nurse Practitioner

## 2022-12-19 NOTE — Telephone Encounter (Signed)
Patient and daughter would like to know the results of PET scan that was completed on November 18th. Please call when results become available [phone]

## 2022-12-23 NOTE — Telephone Encounter (Signed)
Please advise on pet scan

## 2022-12-23 NOTE — Telephone Encounter (Signed)
Patient's daughter calling back for PET results, informed her radiologist has not resulted them. CMA is calling to see if results can be pushed. Daughter states someone told her results would be back within 2 days. Please advise once results are in.

## 2022-12-23 NOTE — Telephone Encounter (Signed)
Spoke with French Ana in the [employer]. She will push PET through, should have today or tomorrow.

## 2022-12-24 ENCOUNTER — Telehealth: Payer: Self-pay

## 2022-12-24 ENCOUNTER — Encounter: Payer: Self-pay | Admitting: Emergency Medicine

## 2022-12-24 NOTE — Telephone Encounter (Signed)
Patient's daughter is calling for PET scan results to be expedited . She is aware of results in mychart and would like to hear from the doctor or nurse. Please advise.

## 2022-12-24 NOTE — Telephone Encounter (Signed)
Called and spoke with pt about pet scan. Informed pts daughter that we are waiting for the scan to be overlooked by the Doctor. Twanna Hy is not happy about that and would like to be contacted before next week.

## 2022-12-25 ENCOUNTER — Other Ambulatory Visit: Payer: Self-pay | Admitting: Pulmonary Disease

## 2022-12-25 ENCOUNTER — Telehealth: Payer: Self-pay | Admitting: Pulmonary Disease

## 2022-12-25 DIAGNOSIS — R918 Other nonspecific abnormal finding of lung field: Secondary | ICD-10-CM

## 2022-12-25 NOTE — Telephone Encounter (Signed)
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 Delton Coombes scheduled for 01/07/2023  Unfortunately, this is metastatic lung cancer

## 2022-12-25 NOTE — Telephone Encounter (Signed)
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 Delton Coombes scheduled for 01/07/2023   Unfortunately, this is metastatic lung cancer

## 2022-12-25 NOTE — Progress Notes (Signed)
Placed order for biopsy of supraclavicular/neck node biopsy

## 2022-12-27 ENCOUNTER — Other Ambulatory Visit: Payer: Self-pay | Admitting: Student

## 2022-12-29 DIAGNOSIS — C3490 Malignant neoplasm of unspecified part of unspecified bronchus or lung: Secondary | ICD-10-CM

## 2022-12-29 HISTORY — DX: Malignant neoplasm of unspecified part of unspecified bronchus or lung: C34.90

## 2022-12-30 NOTE — Progress Notes (Signed)
Berdine Dance, MD  Claudean Kinds; Tomma Lightning, MD We could try [place] rt supraclavicular node bx based on the PET.  The RUL mass and mediastinal nodes should be approachable via bronch  Ruel Favors       Previous Messages    ----- Message ----- From: Claudean Kinds Sent: 12/25/2022   9:03 AM EST To: Claudean Kinds; Ir Procedure Requests Subject: CT Lung Biopsy                                Procedure : CT lung Biopsy                 - unsure of procedure type, I changed it from IR   Reason: metastatic lung cancer Dx: Lung mass [R91.8 (ICD-10-CM)]    History: NM PET , CT Chest high resolution,  chest xrays  Provider: Tomma Lightning, MD  Provider contact : [phone]

## 2022-12-31 ENCOUNTER — Inpatient Hospital Stay (HOSPITAL_COMMUNITY)
Admission: EM | Admit: 2022-12-31 | Discharge: 2023-01-02 | DRG: 092 | Disposition: A | Discharge status: Home Health | Payer: HMO | Attending: Internal Medicine | Admitting: Internal Medicine

## 2022-12-31 ENCOUNTER — Other Ambulatory Visit: Payer: Self-pay

## 2022-12-31 ENCOUNTER — Emergency Department (HOSPITAL_COMMUNITY): Payer: HMO

## 2022-12-31 ENCOUNTER — Observation Stay (HOSPITAL_COMMUNITY): Payer: HMO

## 2022-12-31 DIAGNOSIS — C7951 Secondary malignant neoplasm of bone: Secondary | ICD-10-CM | POA: Diagnosis present

## 2022-12-31 DIAGNOSIS — Z8049 Family history of malignant neoplasm of other genital organs: Secondary | ICD-10-CM

## 2022-12-31 DIAGNOSIS — G928 Other toxic encephalopathy: Secondary | ICD-10-CM | POA: Diagnosis not present

## 2022-12-31 DIAGNOSIS — M79661 Pain in right lower leg: Secondary | ICD-10-CM

## 2022-12-31 DIAGNOSIS — E876 Hypokalemia: Secondary | ICD-10-CM | POA: Diagnosis present

## 2022-12-31 DIAGNOSIS — Z79899 Other long term (current) drug therapy: Secondary | ICD-10-CM

## 2022-12-31 DIAGNOSIS — S86911A Strain of unspecified muscle(s) and tendon(s) at lower leg level, right leg, initial encounter: Secondary | ICD-10-CM | POA: Diagnosis present

## 2022-12-31 DIAGNOSIS — W182XXA Fall in (into) shower or empty bathtub, initial encounter: Secondary | ICD-10-CM | POA: Diagnosis present

## 2022-12-31 DIAGNOSIS — E1122 Type 2 diabetes mellitus with diabetic chronic kidney disease: Secondary | ICD-10-CM | POA: Diagnosis present

## 2022-12-31 DIAGNOSIS — I5032 Chronic diastolic (congestive) heart failure: Secondary | ICD-10-CM | POA: Diagnosis present

## 2022-12-31 DIAGNOSIS — E119 Type 2 diabetes mellitus without complications: Secondary | ICD-10-CM

## 2022-12-31 DIAGNOSIS — N184 Chronic kidney disease, stage 4 (severe): Secondary | ICD-10-CM | POA: Diagnosis present

## 2022-12-31 DIAGNOSIS — Z801 Family history of malignant neoplasm of trachea, bronchus and lung: Secondary | ICD-10-CM

## 2022-12-31 DIAGNOSIS — Z87442 Personal history of urinary calculi: Secondary | ICD-10-CM

## 2022-12-31 DIAGNOSIS — D631 Anemia in chronic kidney disease: Secondary | ICD-10-CM | POA: Diagnosis present

## 2022-12-31 DIAGNOSIS — Z87891 Personal history of nicotine dependence: Secondary | ICD-10-CM

## 2022-12-31 DIAGNOSIS — C799 Secondary malignant neoplasm of unspecified site: Secondary | ICD-10-CM

## 2022-12-31 DIAGNOSIS — J9611 Chronic respiratory failure with hypoxia: Secondary | ICD-10-CM | POA: Diagnosis present

## 2022-12-31 DIAGNOSIS — Z9981 Dependence on supplemental oxygen: Secondary | ICD-10-CM

## 2022-12-31 DIAGNOSIS — S76812A Strain of other specified muscles, fascia and tendons at thigh level, left thigh, initial encounter: Secondary | ICD-10-CM | POA: Diagnosis present

## 2022-12-31 DIAGNOSIS — Z7985 Long-term (current) use of injectable non-insulin antidiabetic drugs: Secondary | ICD-10-CM

## 2022-12-31 DIAGNOSIS — R4182 Altered mental status, unspecified: Secondary | ICD-10-CM | POA: Diagnosis present

## 2022-12-31 DIAGNOSIS — Y92012 Bathroom of single-family (private) house as the place of occurrence of the external cause: Secondary | ICD-10-CM

## 2022-12-31 DIAGNOSIS — Z66 Do not resuscitate: Secondary | ICD-10-CM | POA: Diagnosis present

## 2022-12-31 DIAGNOSIS — C3490 Malignant neoplasm of unspecified part of unspecified bronchus or lung: Secondary | ICD-10-CM | POA: Diagnosis present

## 2022-12-31 DIAGNOSIS — Z7982 Long term (current) use of aspirin: Secondary | ICD-10-CM

## 2022-12-31 DIAGNOSIS — Z880 Allergy status to penicillin: Secondary | ICD-10-CM

## 2022-12-31 DIAGNOSIS — Z888 Allergy status to other drugs, medicaments and biological substances status: Secondary | ICD-10-CM

## 2022-12-31 DIAGNOSIS — T402X5A Adverse effect of other opioids, initial encounter: Secondary | ICD-10-CM | POA: Diagnosis present

## 2022-12-31 DIAGNOSIS — Z981 Arthrodesis status: Secondary | ICD-10-CM

## 2022-12-31 DIAGNOSIS — G9340 Encephalopathy, unspecified: Principal | ICD-10-CM

## 2022-12-31 DIAGNOSIS — M79604 Pain in right leg: Secondary | ICD-10-CM | POA: Diagnosis not present

## 2022-12-31 DIAGNOSIS — Z794 Long term (current) use of insulin: Secondary | ICD-10-CM

## 2022-12-31 DIAGNOSIS — G8929 Other chronic pain: Secondary | ICD-10-CM | POA: Diagnosis present

## 2022-12-31 LAB — I-STAT CHEM 8, ED
BUN: 30 mg/dL — ABNORMAL HIGH (ref 8–23)
BUN: 33 mg/dL — ABNORMAL HIGH (ref 8–23)
Calcium, Ion: 1.07 mmol/L — ABNORMAL LOW (ref 1.15–1.40)
Calcium, Ion: 1.09 mmol/L — ABNORMAL LOW (ref 1.15–1.40)
Chloride: 101 mmol/L (ref 98–111)
Chloride: 102 mmol/L (ref 98–111)
Creatinine, Ser: 3.5 mg/dL — ABNORMAL HIGH (ref 0.61–1.24)
Creatinine, Ser: 4.1 mg/dL — ABNORMAL HIGH (ref 0.61–1.24)
Glucose, Bld: 145 mg/dL — ABNORMAL HIGH (ref 70–99)
Glucose, Bld: 185 mg/dL — ABNORMAL HIGH (ref 70–99)
HCT: 36 % — ABNORMAL LOW (ref 39.0–52.0)
HCT: 33 % — ABNORMAL LOW (ref 39.0–52.0)
Hemoglobin: 12.2 g/dL — ABNORMAL LOW (ref 13.0–17.0)
Hemoglobin: 11.2 g/dL — ABNORMAL LOW (ref 13.0–17.0)
Potassium: 3.2 mmol/L — ABNORMAL LOW (ref 3.5–5.1)
Potassium: 3.2 mmol/L — ABNORMAL LOW (ref 3.5–5.1)
Sodium: 142 mmol/L (ref 135–145)
Sodium: 143 mmol/L (ref 135–145)
TCO2: 25 mmol/L (ref 22–32)
TCO2: 25 mmol/L (ref 22–32)

## 2022-12-31 LAB — CBC WITH DIFFERENTIAL/PLATELET
Abs Immature Granulocytes: 0.12 10*3/uL — ABNORMAL HIGH (ref 0.00–0.07)
Basophils Absolute: 0.1 10*3/uL (ref 0.0–0.1)
Basophils Relative: 1 %
Eosinophils Absolute: 0.1 10*3/uL (ref 0.0–0.5)
Eosinophils Relative: 1 %
HCT: 35.5 % — ABNORMAL LOW (ref 39.0–52.0)
Hemoglobin: 11.3 g/dL — ABNORMAL LOW (ref 13.0–17.0)
Immature Granulocytes: 1 %
Lymphocytes Relative: 16 %
Lymphs Abs: 3 10*3/uL (ref 0.7–4.0)
MCH: 29.3 pg (ref 26.0–34.0)
MCHC: 31.8 g/dL (ref 30.0–36.0)
MCV: 92 fL (ref 80.0–100.0)
Monocytes Absolute: 1.2 10*3/uL — ABNORMAL HIGH (ref 0.1–1.0)
Monocytes Relative: 6 %
Neutro Abs: 14.6 10*3/uL — ABNORMAL HIGH (ref 1.7–7.7)
Neutrophils Relative %: 75 %
Platelets: 278 10*3/uL (ref 150–400)
RBC: 3.86 MIL/uL — ABNORMAL LOW (ref 4.22–5.81)
RDW: 14.2 % (ref 11.5–15.5)
WBC: 19.3 10*3/uL — ABNORMAL HIGH (ref 4.0–10.5)
nRBC: 0 % (ref 0.0–0.2)

## 2022-12-31 LAB — BASIC METABOLIC PANEL
Anion gap: 14 (ref 5–15)
BUN: 30 mg/dL — ABNORMAL HIGH (ref 8–23)
CO2: 25 mmol/L (ref 22–32)
Calcium: 9.1 mg/dL (ref 8.9–10.3)
Chloride: 100 mmol/L (ref 98–111)
Creatinine, Ser: 3.32 mg/dL — ABNORMAL HIGH (ref 0.61–1.24)
GFR, Estimated: 18 mL/min — ABNORMAL LOW (ref >60)
Glucose, Bld: 137 mg/dL — ABNORMAL HIGH (ref 70–99)
Potassium: 3.1 mmol/L — ABNORMAL LOW (ref 3.5–5.1)
Sodium: 139 mmol/L (ref 135–145)

## 2022-12-31 LAB — HEPATIC FUNCTION PANEL
ALT: 12 U/L (ref 0–44)
AST: 15 U/L (ref 15–41)
Albumin: 3.2 g/dL — ABNORMAL LOW (ref 3.5–5.0)
Alkaline Phosphatase: 94 U/L (ref 38–126)
Bilirubin, Direct: 0.1 mg/dL (ref 0.0–0.2)
Indirect Bilirubin: 0.6 mg/dL (ref 0.3–0.9)
Total Bilirubin: 0.7 mg/dL (ref <1.2)
Total Protein: 8.2 g/dL — ABNORMAL HIGH (ref 6.5–8.1)

## 2022-12-31 LAB — CBG MONITORING, ED
Glucose-Capillary: 164 mg/dL — ABNORMAL HIGH (ref 70–99)
Glucose-Capillary: 166 mg/dL — ABNORMAL HIGH (ref 70–99)

## 2022-12-31 LAB — I-STAT CG4 LACTIC ACID, ED
Lactic Acid, Venous: 0.6 mmol/L (ref 0.5–1.9)
Lactic Acid, Venous: 1 mmol/L (ref 0.5–1.9)
Lactic Acid, Venous: 2.5 mmol/L (ref 0.5–1.9)

## 2022-12-31 LAB — APTT: aPTT: 32 s (ref 24–36)

## 2022-12-31 LAB — PROTIME-INR
INR: 1.2 (ref 0.8–1.2)
Prothrombin Time: 15.3 s — ABNORMAL HIGH (ref 11.4–15.2)

## 2022-12-31 MED ORDER — HEPARIN SODIUM (PORCINE) 5000 UNIT/ML IJ SOLN
5000.0000 [IU] | Freq: Three times a day (TID) | INTRAMUSCULAR | Status: DC
  Administered 2023-01-01 – 2023-01-02 (×6): 5000 [IU] via SUBCUTANEOUS
  Filled 2022-12-31 (×6): qty 1

## 2022-12-31 MED ORDER — POTASSIUM CHLORIDE 20 MEQ PO PACK
20.0000 meq | PACK | Freq: Two times a day (BID) | ORAL | Status: AC
  Administered 2023-01-01: 20 meq via ORAL
  Filled 2022-12-31: qty 1

## 2022-12-31 NOTE — Hospital Course (Signed)
Started feeling sharp right leg pain started yesterday and has bene getting progressively worse. this morning, fell in the shower because of severe pain did not hit head. While he's been waiting he's just been getting gsicker and sicker. Was very coherent when he got here, but was waiting and doing fine. Started getting fidgety and jerked IV out. Did not take any pain medicine today, took two hydrocodones yesterday beause he does have severe back pain.   Arousable, states his full name   When he came back from ultrasound noticed beforehand  Medications:  Aspirin 81mg   Atorvastatin 40mg   Wellbutrin 150mg   Trulicity 1.5mg  Gabapentin 100mg  TID  Lantus 40U  Midodrine 5mg  BID  Zoloft 100mg   Torsemdie 40mg    Social:  Tobacco use - over 35 years ago  ------------------------------------------------------------------- 12/4 - Left knee, mild tenderness tendon area, posterior knee. Pain with knee extension - No heat, no knee swelling appreacitaed - Started on Monday, had PT, pain prior to PT.  - he has been taking Tylenol. He took hydrocodone, took 2 tablets. Didn't take any yesterday.  - Medication reaction, build up hydrocodone.  - Lidocaine patch started today - PT eval today - Has knee sleeve at home.  - at home 1.5 L

## 2022-12-31 NOTE — ED Provider Triage Note (Signed)
Emergency Medicine Provider Triage Evaluation Note  Jeffery Fernandez , a 77 y.o. male  was evaluated in triage.  Pt complains of leg pain.  Review of Systems  Positive:  Negative:   Physical Exam  BP 108/76   Pulse (!) 130   Temp 98.6 F (37 C) (Oral)   Resp 18   Ht 5\' 9"  (1.753 m)   Wt 84.7 kg   SpO2 92%   BMI 27.58 kg/m  Gen:   Awake, no distress   Resp:  Normal effort  MSK:   Moves extremities without difficulty  Other:    Medical Decision Making  Medically screening exam initiated at 2:29 PM.  Appropriate orders placed.  Jeffery Fernandez was informed that the remainder of the evaluation will be completed by another provider, this initial triage assessment does not replace that evaluation, and the importance of remaining in the ED until their evaluation is complete.  Concerned for right calf pain since yesterday. New recent diagnosis of metastatic lung cancer. Difficulty ambulating on leg d/t pain. Patient does have some nausea associated with his leg pain when it gets severe. Patient on 1.5L O2 at baseline. Did not bring oxygen machine into ED. Patient does have tachycardia so will also get CT chest as well given high suspicion for DVT.  Denies fever, chest pain, dyspnea, cough, vomiting, diarrhea.    Dorthy Cooler, [place] 12/31/22 1434

## 2022-12-31 NOTE — ED Notes (Signed)
This RN called CT, per CT patient will be transported when next CT is open

## 2022-12-31 NOTE — ED Notes (Signed)
Patient transported to CT 

## 2022-12-31 NOTE — ED Provider Notes (Signed)
[place] EMERGENCY DEPARTMENT AT [facility] Provider Note   CSN: [id number] Arrival date & time: 12/31/22  1401     History  Chief Complaint  Patient presents with   Leg Pain    Jeffery Fernandez is a 77 y.o. male.   Leg Pain Patient with leg pain.  Does have recently diagnosed metastatic lung cancer to back.  However not pain in the calf.  Due to have lymph node biopsy.  However came in for the pain.  Cannot walk due to it.  Does have some nausea.  However while in the ER became much more confused with decreased mental status.  No fevers or chills.  Normally alert and orient x 4.  Has not had episodes like this with confusion previously.  Has chronic kidney disease.     Home Medications Prior to Admission medications   Medication Sig Start Date End Date Taking? Authorizing Provider  acetaminophen (TYLENOL) 500 MG tablet Take 1,000 mg by mouth every 6 (six) hours as needed (for pain.).    [provider]  aspirin EC 81 MG tablet Take 81 mg by mouth daily. Swallow whole.    [provider]  atorvastatin (LIPITOR) 40 MG tablet Take 40 mg by mouth at bedtime.    [provider]  buPROPion (WELLBUTRIN XL) 150 MG 24 hr tablet Take 150 mg by mouth every morning.    [provider]  Dulaglutide (TRULICITY) 1.5 MG/0.5ML SOAJ Inject 1.5 mg into the skin daily. Takes on Thursdays 10/01/22   [provider]  gabapentin (NEURONTIN) 100 MG capsule Take 100 mg by mouth 3 (three) times daily. 100mg  in the morning and evening. 200mg  at night    [provider]  insulin glargine (LANTUS) 100 UNIT/ML injection Inject 40 Units into the skin daily.    [provider]  midodrine (PROAMATINE) 5 MG tablet Take 1 tablet (5 mg total) by mouth 2 (two) times daily with a meal. 12/03/22 01/02/23  [name], MD  sertraline (ZOLOFT) 100 MG tablet Take 100 mg by mouth daily.    [provider]  torsemide (DEMADEX) 20 MG  tablet Take 2 tablets (40 mg total) by mouth daily. 12/04/22 01/03/23  [name], MD  Turmeric (QC TUMERIC COMPLEX) 500 MG CAPS Take 2,400 mg by mouth in the morning and at bedtime.    [provider]      Allergies    Amlodipine, Penicillins, and Hydralazine    Review of Systems   Review of Systems  Physical Exam Updated Vital Signs BP (!) 108/96   Pulse (!) 115   Temp 99.2 F (37.3 C) (Oral)   Resp 20   Ht 5\' 9"  (1.753 m)   Wt 84.7 kg   SpO2 95%   BMI 27.58 kg/m  Physical Exam Vitals and nursing note reviewed.  HENT:     Head: Normocephalic.  Cardiovascular:     Rate and Rhythm: Regular rhythm. Tachycardia present.  Chest:     Chest wall: No tenderness.  Abdominal:     Tenderness: There is no abdominal tenderness.  Musculoskeletal:        General: Tenderness present.     Comments: Tenderness in the right calf without edema.  Skin:    General: Skin is warm.  Neurological:     Mental Status: He is alert and oriented to person, place, and time.     Comments: Awake and pleasant but somewhat slow to answer.  Some confusion.  ED Results / Procedures / Treatments   Labs (all labs ordered are listed, but only abnormal results are displayed) Labs Reviewed  CBC WITH DIFFERENTIAL/PLATELET - Abnormal; Notable for the following components:      Result Value   WBC 19.3 (*)    RBC 3.86 (*)    Hemoglobin 11.3 (*)    HCT 35.5 (*)    Neutro Abs 14.6 (*)    Monocytes Absolute 1.2 (*)    Abs Immature Granulocytes 0.12 (*)    All other components within normal limits  BASIC METABOLIC PANEL - Abnormal; Notable for the following components:   Potassium 3.1 (*)    Glucose, Bld 137 (*)    BUN 30 (*)    Creatinine, Ser 3.32 (*)    GFR, Estimated 18 (*)    All other components within normal limits  PROTIME-INR - Abnormal; Notable for the following components:   Prothrombin Time 15.3 (*)    All other components within normal limits  HEPATIC FUNCTION  PANEL - Abnormal; Notable for the following components:   Total Protein 8.2 (*)    Albumin 3.2 (*)    All other components within normal limits  I-STAT CHEM 8, ED - Abnormal; Notable for the following components:   Potassium 3.2 (*)    BUN 30 (*)    Creatinine, Ser 3.50 (*)    Glucose, Bld 145 (*)    Calcium, Ion 1.07 (*)    Hemoglobin 12.2 (*)    HCT 36.0 (*)    All other components within normal limits  CBG MONITORING, ED - Abnormal; Notable for the following components:   Glucose-Capillary 164 (*)    All other components within normal limits  I-STAT CG4 LACTIC ACID, ED - Abnormal; Notable for the following components:   Lactic Acid, Venous 2.5 (*)    All other components within normal limits  CBG MONITORING, ED - Abnormal; Notable for the following components:   Glucose-Capillary 166 (*)    All other components within normal limits  I-STAT CHEM 8, ED - Abnormal; Notable for the following components:   Potassium 3.2 (*)    BUN 33 (*)    Creatinine, Ser 4.10 (*)    Glucose, Bld 185 (*)    Calcium, Ion 1.09 (*)    Hemoglobin 11.2 (*)    HCT 33.0 (*)    All other components within normal limits  CULTURE, BLOOD (ROUTINE X 2)  CULTURE, BLOOD (ROUTINE X 2)  APTT  URINALYSIS, W/ REFLEX TO CULTURE (INFECTION SUSPECTED)  CBC  CREATININE, SERUM  CBC  BASIC METABOLIC PANEL  I-STAT CG4 LACTIC ACID, ED    EKG EKG Interpretation Date/Time:  Tuesday December 31 2022 17:57:39 EST Ventricular Rate:  115 PR Interval:  152 QRS Duration:  151 QT Interval:  393 QTC Calculation: 544 R Axis:   -38  Text Interpretation: Sinus tachycardia Probable left atrial enlargement Left bundle branch block Baseline wander in lead(s) I II aVR aVF rate increased Confirmed by Benjiman Core [phone]) on 12/31/2022 6:09:17 PM  Radiology CT Head Wo Contrast  Result Date: 12/31/2022 CLINICAL DATA:  Mental status change EXAM: CT HEAD WITHOUT CONTRAST TECHNIQUE: Contiguous axial images were obtained  from the base of the skull through the vertex without intravenous contrast. RADIATION DOSE REDUCTION: This exam was performed according to the departmental dose-optimization program which includes automated exposure control, adjustment of the mA and/or kV according to patient size and/or use of iterative reconstruction technique. COMPARISON:  05/13/2020 CT head  FINDINGS: Brain: No evidence of acute infarction, hemorrhage, mass, mass effect, or midline shift. No hydrocephalus or extra-axial fluid collection. Periventricular white matter changes, likely the sequela of chronic small vessel ischemic disease. Mildly advanced cerebral atrophy for age. Basal ganglia calcifications. Vascular: No hyperdense vessel. Atherosclerotic calcifications in the intracranial carotid and vertebral arteries. Skull: Negative for fracture or focal lesion. Sinuses/Orbits: No acute finding. Other: The mastoid air cells are well aerated. IMPRESSION: No acute intracranial process. Electronically Signed   By: Wiliam Ke M.D.   On: 12/31/2022 19:42   DG Chest Portable 1 View  Result Date: 12/31/2022 CLINICAL DATA:  Altered mental status, shortness of breath. EXAM: PORTABLE CHEST 1 VIEW COMPARISON:  December 01, 2022. FINDINGS: Stable cardiomegaly. Left lung is clear. Minimal right basilar subsegmental atelectasis is noted with small pleural effusion. Bony thorax is unremarkable. IMPRESSION: Minimal right basilar subsegmental atelectasis with small right pleural effusion. Electronically Signed   By: Lupita Raider M.D.   On: 12/31/2022 19:37   VAS [place] LOWER EXTREMITY VENOUS (DVT) (7a-7p)  Result Date: 12/31/2022  Lower Venous DVT Study Patient Name:  Jeffery Fernandez  Date of Exam:   12/31/2022 Medical Rec #: [id number]      Accession #:    [id number] Date of Birth: Dec 05, 1945       Patient Gender: M Patient Age:   64 years Exam Location:  [facility] Procedure:      VAS [place] LOWER EXTREMITY VENOUS (DVT) Referring Phys: [facility] --------------------------------------------------------------------------------  Indications: Pain.  Limitations: Patient movement due to pain. Comparison Study: No prior exam. Performing Technologist: Fernande Bras  Examination Guidelines: A complete evaluation includes B-mode imaging, spectral Doppler, color Doppler, and power Doppler as needed of all accessible portions of each vessel. Bilateral testing is considered an integral part of a complete examination. Limited examinations for reoccurring indications may be performed as noted. The reflux portion of the exam is performed with the patient in reverse Trendelenburg.  +---------+---------------+---------+-----------+----------+----------------+ RIGHT    CompressibilityPhasicitySpontaneityPropertiesThrombus Aging   +---------+---------------+---------+-----------+----------+----------------+ CFV      Full           Yes      Yes                                   +---------+---------------+---------+-----------+----------+----------------+ SFJ      Full                                                          +---------+---------------+---------+-----------+----------+----------------+ FV Prox  Full                                                          +---------+---------------+---------+-----------+----------+----------------+ FV Mid   Full                                                          +---------+---------------+---------+-----------+----------+----------------+  FV DistalFull                                                          +---------+---------------+---------+-----------+----------+----------------+ PFV      Full                                                          +---------+---------------+---------+-----------+----------+----------------+ POP      Full           Yes      Yes                                    +---------+---------------+---------+-----------+----------+----------------+ PTV      Full                                                          +---------+---------------+---------+-----------+----------+----------------+ PERO                                                  Patent by color. +---------+---------------+---------+-----------+----------+----------------+   +----+---------------+---------+-----------+----------+--------------+ LEFTCompressibilityPhasicitySpontaneityPropertiesThrombus Aging +----+---------------+---------+-----------+----------+--------------+ CFV Full           Yes      Yes                                 +----+---------------+---------+-----------+----------+--------------+    *See table(s) above for measurements and observations.    Preliminary     Procedures Procedures    Medications Ordered in ED Medications  heparin injection 5,000 Units (has no administration in time range)  potassium chloride (KLOR-CON) packet 20 mEq (has no administration in time range)    ED Course/ Medical Decision Making/ A&P                                 Medical Decision Making Amount and/or Complexity of Data Reviewed Radiology: ordered.  Risk Decision regarding hospitalization.   Patient with calf pain.  However developed more confusion while in the ER.  Has had some confusion while in the hospital but not at the acute episode.  Mental status has decreased somewhat here.  Still able answer questions but not at neurologic baseline.  Workup otherwise reassuring.  Headache reassuring.  Creatinine elevated but at baseline.  Negative Doppler.  Chest x-ray reassuring.  Will discuss with internal medicine for admission.  Doubt acute CVA.  Infection felt less likely, although urinalysis is still pending.            Final Clinical Impression(s) / ED Diagnoses Final diagnoses:  Encephalopathy, unspecified type  Metastatic malignant neoplasm,  unspecified site [facility])    Rx / DC Orders ED Discharge  Orders     None         Benjiman Core, MD 12/31/22 2216

## 2022-12-31 NOTE — ED Notes (Signed)
Pt brought to triage for reevaluation. Daughter concerned for pt not acting right. Pt now altered and trying to get up out of the chair. Evaluated by PA in triage. Charge RN made aware.

## 2022-12-31 NOTE — H&P (Incomplete)
Date: 12/31/2022               Patient Name:  Jeffery Fernandez MRN: [id number]  DOB: 12-23-1945 Age / Sex: 77 y.o., male   PCP: [name], MD         Medical Service: Internal Medicine Teaching Service         Attending Physician: Dr. Gust Rung, DO      First Contact: Dr. Laretta Bolster, MD Pager [phone]    Second Contact: Dr. Rana Snare, DO Pager [phone]         After Hours (After 5p/  First Contact Pager: [phone]  weekends / holidays): Second Contact Pager: [phone]   SUBJECTIVE   Chief Complaint: Altered Mental Status   History of Present Illness:   Mr. Jeffery Fernandez is a 77 year old male with past medical history of high suspected primary lung cancer, type two diabetes, hypertension, and CKD3a who initially  presented to the emergency department for right lower extremity pain. Of note, history is taken from daughter. Pt was recently discharged from the internal medicine teaching service on 11/5 for acute heart failure exacerbation.  He started feeling a sharp pain in his right leg yesterday, and has progressively worsened to the point where he fell in the shower in this morning. The pain is located in the right posterior thigh per daughter. He is unable to answer further questions about the pain and daughter does not know circumstances regarding it. He is able to say that he did not hit his head when he fell.   While in the emergency department, his daughter who accompanied him noticed that he became very lethargic. He was fully coherent when he got here, but started becoming more "fidgety" and even jerked his IV out. At his baseline this morning he is fully able to answer orientation questions correctly, however is not able to now. During his previous hospitalization, there were concerns for hospital delirium, for which he would have audio and visual hallucinations.   His daughter denies any abnormal symptoms leading up to these events such as fevers, chills, nausea,  vomiting, diarrhea, or constipation. His daughter does say that since his previous hospitalization he has only been having very small meals daily, and has not had an appetite.    ED Course: RLE DVT ordered, as well as CT head. IMTS consulted for admission.    Meds:   Aspirin 81mg   Atorvastatin 40mg   Wellbutrin 150mg   Trulicity 1.5mg  Gabapentin 100mg  TID  Lantus 40U  Midodrine 5mg  BID  Zoloft 100mg   Torsemide 40mg  Current Meds  Medication Sig  . acetaminophen (TYLENOL) 500 MG tablet Take 1,000 mg by mouth every 6 (six) hours as needed (for pain.).  Marland Kitchen aspirin EC 81 MG tablet Take 81 mg by mouth daily. Swallow whole.  Marland Kitchen atorvastatin (LIPITOR) 40 MG tablet Take 40 mg by mouth at bedtime.  Marland Kitchen buPROPion (WELLBUTRIN XL) 150 MG 24 hr tablet Take 150 mg by mouth every morning.  . busPIRone (BUSPAR) 10 MG tablet Take 10 mg by mouth in the morning.  . Dulaglutide (TRULICITY) 1.5 MG/0.5ML SOAJ Inject 1.5 mg into the skin daily. Takes on Thursdays  . gabapentin (NEURONTIN) 100 MG capsule Take 100 mg by mouth 3 (three) times daily.  Marland Kitchen HYDROcodone-acetaminophen (NORCO/VICODIN) 5-325 MG tablet Take 1 tablet by mouth daily.  . insulin glargine (LANTUS) 100 UNIT/ML injection Inject 40 Units into the skin in the morning.  . midodrine (PROAMATINE) 5 MG tablet Take  1 tablet (5 mg total) by mouth 2 (two) times daily with a meal.  . sertraline (ZOLOFT) 100 MG tablet Take 100 mg by mouth in the morning.    Past Medical History  Past Surgical History:  Procedure Laterality Date  . ANTERIOR CERVICAL DECOMP/DISCECTOMY FUSION N/A 06/26/2016   Procedure: Re-Exploration of Cervical Wound;  Surgeon: Donalee Citrin, MD;  Location: [facility] OR;  Service: Neurosurgery;  Laterality: N/A;  . ANTERIOR CERVICAL DECOMP/DISCECTOMY FUSION N/A 06/26/2016   Procedure: ANTERIOR CERVICAL DECOMPRESSION/DISCECTOMY FUSION, INTERBODY PROSTESIS, PLATE, CERVICAL THREE CERVICAL FOUR, CERVICAL FOUR CERVICAL FIVE CERVICAL SIX;  Surgeon:  Tressie Stalker, MD;  Location: [facility] OR;  Service: Neurosurgery;  Laterality: N/A;  . APPLICATION OF WOUND VAC  06/05/2020   Procedure: APPLICATION OF WOUND VAC;  Surgeon: Tressie Stalker, MD;  Location: [facility] OR;  Service: Neurosurgery;;  . BACK SURGERY    . CATARACT EXTRACTION Bilateral   . KIDNEY STONE SURGERY    . KYPHOPLASTY N/A 01/19/2018   Procedure: KYPHOPLASTY LUMBAR THREE;  Surgeon: Tressie Stalker, MD;  Location: [facility] OR;  Service: Neurosurgery;  Laterality: N/A;  KYPHOPLASTY LUMBAR THREE  . LUMBAR WOUND DEBRIDEMENT N/A 06/05/2020   Procedure: LUMBAR WOUND DEBRIDEMENT;  Surgeon: Tressie Stalker, MD;  Location: [facility] OR;  Service: Neurosurgery;  Laterality: N/A;    Social:  Lives With: Wife Occupation: Retired Research scientist (life sciences): Self Level of Function: When not acutely ill, wife helps with preparing food, but otherwise independent in all ADLs and iADLs.  PCP: Dr. [name] MD Substances: 30 pack year smoking history, but quit 30 years ago.   Family History:   Heart Disease in brother  Lung Cancer in Sister Uterine Cancer in mother  Allergies: Allergies as of 12/31/2022 - Review Complete 12/31/2022  Allergen Reaction Noted  . Amlodipine Swelling 11/28/2022  . Penicillins Other (See Comments) 06/08/2010  . Hydralazine Other (See Comments) 11/28/2022    Review of Systems: A complete ROS was negative except as per HPI.   OBJECTIVE:   Physical Exam: Blood pressure 117/84, pulse (!) 104, temperature (!) 97.5 F (36.4 C), temperature source Oral, resp. rate 20, height 5\' 9"  (1.753 m), weight 84.7 kg, SpO2 95%.  Constitutional: In and out of sleep, arousable to stimulation, in no acute distress HENT: normocephalic atraumatic, mucous membranes dry Eyes: conjunctiva non-erythematous Neck: supple Cardiovascular: regular rate and rhythm, no m/r/g Pulmonary/Chest: normal work of breathing on room air, lungs clear to auscultation bilaterally Abdominal: soft,  non-tender, non-distended MSK: RLE internally rotated, very painful to palpation in right thigh or any manipulation of RLE Neurological: alert & oriented x 3, 5/5 strength in bilateral upper and lower extremities, normal gait Skin: warm and dry Psych: normal mood and affect  Labs: CBC    Component Value Date/Time   WBC 19.3 (H) 12/31/2022 1446   RBC 3.86 (L) 12/31/2022 1446   HGB 11.2 (L) 12/31/2022 2020   HCT 33.0 (L) 12/31/2022 2020   PLT 278 12/31/2022 1446   MCV 92.0 12/31/2022 1446   MCH 29.3 12/31/2022 1446   MCHC 31.8 12/31/2022 1446   RDW 14.2 12/31/2022 1446   LYMPHSABS 3.0 12/31/2022 1446   MONOABS 1.2 (H) 12/31/2022 1446   EOSABS 0.1 12/31/2022 1446   BASOSABS 0.1 12/31/2022 1446     CMP     Component Value Date/Time   NA 143 12/31/2022 2020   NA 142 07/03/2022 1356   K 3.2 (L) 12/31/2022 2020   CL 102 12/31/2022 2020   CO2 25 12/31/2022 1446  GLUCOSE 185 (H) 12/31/2022 2020   BUN 33 (H) 12/31/2022 2020   BUN 22 07/03/2022 1356   CREATININE 4.10 (H) 12/31/2022 2020   CALCIUM 9.1 12/31/2022 1446   PROT 8.2 (H) 12/31/2022 1745   PROT 7.4 07/03/2022 1356   ALBUMIN 3.2 (L) 12/31/2022 1745   ALBUMIN 4.1 07/03/2022 1356   AST 15 12/31/2022 1745   ALT 12 12/31/2022 1745   ALKPHOS 94 12/31/2022 1745   BILITOT 0.7 12/31/2022 1745   BILITOT 0.3 07/03/2022 1356   GFRNONAA 18 (L) 12/31/2022 1446   GFRAA 47 (L) 01/14/2018 1131    Imaging:    EKG: personally reviewed my interpretation is***. Prior EKG***  ASSESSMENT & PLAN:   Assessment & Plan by Problem: Principal Problem:   Altered mental status   Corleone H Takemoto is a 77 y.o. person living with a history of *** who presented with *** and admitted for *** on hospital day 0  *** ***  *** ***  *** ***  Diet: {NAMES:3044014::"Normal","Heart Healthy","Carb-Modified","Renal","Carb/Renal","NPO","TPN","Tube Feeds"} VTE: {NAMES:3044014::"Heparin","Enoxaparin","SCDs","DOAC","None"} IVF:  {NAMES:3044014::"None","NS","1/2 NS","LR","D5","D10"},{NAMES:3044014::"None","10cc/hr","25cc/hr","50cc/hr","75cc/hr","100cc/hr","110cc/hr","125cc/hr","Bolus"} Code: {NAMES:3044014::"Full","DNR","DNI","DNR/DNI","Comfort Care","Unknown"}  Prior to Admission Living Arrangement: {NAMES:3044014::"Home, living ***","SNF, ***","Homeless","***"} Anticipated Discharge Location: {NAMES:3044014::"Home","SNF","CIR","***"} Barriers to Discharge: ***  Dispo: Admit patient to {STATUS:3044014::"Observation with expected length of stay less than 2 midnights.","Inpatient with expected length of stay greater than 2 midnights."}  Signed: Olegario Messier, MD Internal Medicine Resident PGY-3  12/31/2022, 11:46 PM

## 2022-12-31 NOTE — ED Notes (Signed)
Pt appears to be lethargic, hard to arouse, only able to follow commands after being verbally cued multiple times and has slight tremor. EKG  captured. Provider made aware.

## 2022-12-31 NOTE — ED Triage Notes (Signed)
Pt here for R leg pain that has worsened since yesterday with difficulty walking due to the pain. Hx of lung cancer, on oxygen PRN. 92% on RA in triage.

## 2022-12-31 NOTE — H&P (Signed)
Date: 12/31/2022               Patient Name:  Jeffery Fernandez MRN: [id number]  DOB: 1945-07-01 Age / Sex: 77 y.o., male   PCP: [name], MD         Medical Service: Internal Medicine Teaching Service         Attending Physician: Dr. Gust Rung, DO      First Contact: Dr. Laretta Bolster, MD Pager [phone]    Second Contact: Dr. Rana Snare, DO Pager [phone]         After Hours (After 5p/  First Contact Pager: [phone]  weekends / holidays): Second Contact Pager: [phone]   SUBJECTIVE   Chief Complaint: Altered Mental Status   History of Present Illness:   Jeffery Fernandez is a 77 year old male with past medical history of high suspected primary lung cancer, type two diabetes, hypertension, and CKD3a who initially  presented to the emergency department for right lower extremity pain. Of note, history is taken from daughter. Pt was recently discharged from the internal medicine teaching service on 11/5 for acute heart failure exacerbation.  He started feeling a sharp pain in his right leg yesterday, and has progressively worsened to the point where he fell in the shower in this morning. The pain is located in the right posterior thigh per daughter. He is unable to answer further questions about the pain and daughter does not know circumstances regarding it. He is able to say that he did not hit his head when he fell.   While in the emergency department, his daughter who accompanied him noticed that he became very lethargic. He was fully coherent when he got here, but started becoming more "fidgety" and even jerked his IV out. At his baseline this morning he is fully able to answer orientation questions correctly, however is not able to now. During his previous hospitalization, there were concerns for hospital delirium, for which he would have audio and visual hallucinations.   His daughter denies any abnormal symptoms leading up to these events such as fevers, chills, nausea,  vomiting, diarrhea, or constipation. His daughter does say that since his previous hospitalization he has only been having very small meals daily, and has not had an appetite.    ED Course: RLE DVT ordered, as well as CT head. IMTS consulted for admission.    Meds:   Aspirin 81mg   Atorvastatin 40mg   Wellbutrin 150mg   Trulicity 1.5mg  Gabapentin 100mg  TID  Lantus 40U  Midodrine 5mg  BID  Zoloft 100mg   Torsemide 40mg  Current Meds  Medication Sig   acetaminophen (TYLENOL) 500 MG tablet Take 1,000 mg by mouth every 6 (six) hours as needed (for pain.).   aspirin EC 81 MG tablet Take 81 mg by mouth daily. Swallow whole.   atorvastatin (LIPITOR) 40 MG tablet Take 40 mg by mouth at bedtime.   buPROPion (WELLBUTRIN XL) 150 MG 24 hr tablet Take 150 mg by mouth every morning.   busPIRone (BUSPAR) 10 MG tablet Take 10 mg by mouth in the morning.   Dulaglutide (TRULICITY) 1.5 MG/0.5ML SOAJ Inject 1.5 mg into the skin daily. Takes on Thursdays   gabapentin (NEURONTIN) 100 MG capsule Take 100 mg by mouth 3 (three) times daily.   HYDROcodone-acetaminophen (NORCO/VICODIN) 5-325 MG tablet Take 1 tablet by mouth daily.   insulin glargine (LANTUS) 100 UNIT/ML injection Inject 40 Units into the skin in the morning.   midodrine (PROAMATINE) 5 MG tablet Take  1 tablet (5 mg total) by mouth 2 (two) times daily with a meal.   sertraline (ZOLOFT) 100 MG tablet Take 100 mg by mouth in the morning.    Past Medical History  Past Surgical History:  Procedure Laterality Date   ANTERIOR CERVICAL DECOMP/DISCECTOMY FUSION N/A 06/26/2016   Procedure: Re-Exploration of Cervical Wound;  Surgeon: Donalee Citrin, MD;  Location: [facility] OR;  Service: Neurosurgery;  Laterality: N/A;   ANTERIOR CERVICAL DECOMP/DISCECTOMY FUSION N/A 06/26/2016   Procedure: ANTERIOR CERVICAL DECOMPRESSION/DISCECTOMY FUSION, INTERBODY PROSTESIS, PLATE, CERVICAL THREE CERVICAL FOUR, CERVICAL FOUR CERVICAL FIVE CERVICAL SIX;  Surgeon: Tressie Stalker,  MD;  Location: [facility] OR;  Service: Neurosurgery;  Laterality: N/A;   APPLICATION OF WOUND VAC  06/05/2020   Procedure: APPLICATION OF WOUND VAC;  Surgeon: Tressie Stalker, MD;  Location: [facility] OR;  Service: Neurosurgery;;   BACK SURGERY     CATARACT EXTRACTION Bilateral    KIDNEY STONE SURGERY     KYPHOPLASTY N/A 01/19/2018   Procedure: KYPHOPLASTY LUMBAR THREE;  Surgeon: Tressie Stalker, MD;  Location: [facility] OR;  Service: Neurosurgery;  Laterality: N/A;  KYPHOPLASTY LUMBAR THREE   LUMBAR WOUND DEBRIDEMENT N/A 06/05/2020   Procedure: LUMBAR WOUND DEBRIDEMENT;  Surgeon: Tressie Stalker, MD;  Location: [facility] OR;  Service: Neurosurgery;  Laterality: N/A;    Social:  Lives With: Wife Occupation: Retired Research scientist (life sciences): Self Level of Function: When not acutely ill, wife helps with preparing food, but otherwise independent in all ADLs and iADLs.  PCP: Dr. [name] MD Substances: 30 pack year smoking history, but quit 30 years ago.   Family History:   Heart Disease in brother  Lung Cancer in Sister Uterine Cancer in mother  Allergies: Allergies as of 12/31/2022 - Review Complete 12/31/2022  Allergen Reaction Noted   Amlodipine Swelling 11/28/2022   Penicillins Other (See Comments) 06/08/2010   Hydralazine Other (See Comments) 11/28/2022    Review of Systems: A complete ROS was negative except as per HPI.   OBJECTIVE:   Physical Exam: Blood pressure 117/84, pulse (!) 104, temperature (!) 97.5 F (36.4 C), temperature source Oral, resp. rate 20, height 5\' 9"  (1.753 m), weight 84.7 kg, SpO2 95%.  Constitutional: In and out of sleep, arousable to stimulation, in no acute distress HENT: normocephalic atraumatic, mucous membranes dry Eyes: conjunctiva non-erythematous Neck: supple Cardiovascular: regular rate and rhythm, no m/r/g Pulmonary/Chest: normal work of breathing on room air, lungs clear to auscultation bilaterally Abdominal: soft, non-tender, non-distended MSK: RLE  internally rotated, very painful to palpation in right thigh or any manipulation of RLE Neurological: alert & oriented x 3, 5/5 strength in bilateral upper and lower extremities, normal gait Skin: warm and dry Psych: normal mood and affect  Labs: CBC    Component Value Date/Time   WBC 19.3 (H) 12/31/2022 1446   RBC 3.86 (L) 12/31/2022 1446   HGB 11.2 (L) 12/31/2022 2020   HCT 33.0 (L) 12/31/2022 2020   PLT 278 12/31/2022 1446   MCV 92.0 12/31/2022 1446   MCH 29.3 12/31/2022 1446   MCHC 31.8 12/31/2022 1446   RDW 14.2 12/31/2022 1446   LYMPHSABS 3.0 12/31/2022 1446   MONOABS 1.2 (H) 12/31/2022 1446   EOSABS 0.1 12/31/2022 1446   BASOSABS 0.1 12/31/2022 1446     CMP     Component Value Date/Time   NA 143 12/31/2022 2020   NA 142 07/03/2022 1356   K 3.2 (L) 12/31/2022 2020   CL 102 12/31/2022 2020   CO2 25 12/31/2022 1446  GLUCOSE 185 (H) 12/31/2022 2020   BUN 33 (H) 12/31/2022 2020   BUN 22 07/03/2022 1356   CREATININE 4.10 (H) 12/31/2022 2020   CALCIUM 9.1 12/31/2022 1446   PROT 8.2 (H) 12/31/2022 1745   PROT 7.4 07/03/2022 1356   ALBUMIN 3.2 (L) 12/31/2022 1745   ALBUMIN 4.1 07/03/2022 1356   AST 15 12/31/2022 1745   ALT 12 12/31/2022 1745   ALKPHOS 94 12/31/2022 1745   BILITOT 0.7 12/31/2022 1745   BILITOT 0.3 07/03/2022 1356   GFRNONAA 18 (L) 12/31/2022 1446   GFRAA 47 (L) 01/14/2018 1131    Imaging:  CT Head: No acute intracranial Process   Xray Right Hip: Mild degenerative changes in right hip  Xray Right knee: Mild degenerative changes in right knee with medical compartment and narrowing  EKG: personally reviewed my interpretation is sinus tachycardia, with no axis deviation, normal intervals, similar to previous EKG.  ASSESSMENT & PLAN:   Assessment & Plan by Problem: Principal Problem:   Altered mental status   Swan H Kapral is a 77 y.o. person living with a history of suspected primary lung malignancy, HTN, T2DM who presented with right leg  pain and admitted for altered mental status on hospital day 0  #Altered Mental Status Pt presents with acute onset altered mental status defined by increased fidgeting and excessive fatigue and lethargy. Etiology unclear at this time. During encounter, pt was in and out of sleep however was arousable and coherent when answering yes or no questions. He did respond to commands as well. CT head reassuring. He does take Norco for his chronic back pain, for which he took two yesterday, so could be in the setting of decreased clearance in CKD and elder male. With respect to his medications, he is also taking sertraline and bupropion, which could be playing a roll however he has been taking these medications for quite some time and has never had a problem with it.   Infection could be source, given elevated WBC count, UA needs to be collected still. Please see problem below for further details. CT head did not show any brain mets givne patient's recent cancer diagnosis with metastasis.   Decreased appetite could also be playing a role, especially without changes to his insulin regimen. Glucose on BMP is 137, reassuringly.   Plan:  - Trend WBC count  - Monitor fever curve - F/U UA - Holding centrally acting medications (SSRI, Bupropion, Gabapentin)   Will hold centrally acting medications  #Right Leg Pain Pt with abrupt and acute onset of right lower extremity pain. Per daughter, she states that he was complaining of the pain "right behind his knee". DVT studies have been done however results have not been read yet. Any small palpation to knee joint causes significant pain, and any extension of that joint also causes significant pain. Not able to palpate any joint effusions.  Given increased white blood cell count, tachycardia, generalized somnolence, acuity and severity of pain, will evaluate for septic arthritis with MRI of knee. X-Ray of knee reassuring, however X-Rays are not able to pick up early  septic arthritis. Reassuringly, he has not had systemic signs of infection like fever or chills.    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   #HFpEF Echo in 10/28 showed an ejection fraction of 55-60%. Does not appear volume overloaded on exam.   #CKD Stage 4 Current creatinine at 3.32, and GFR at 18, seems consistent with baseline.   #Type 2 DM Last A1c 6.2, home meds include Trulicity 1.5mg  weekly, and Lantus 40U. Will manage with sliding scale for now until patient is more awake and eating more.   #Hypokalemia  Potassium at 3.1 on BMP, will replete as able.   #Normocytic Anemia  Hb at 10.5, relatively consistent with previous values. Could be related to kidney disease, however last iron studies I can see are from 2021. Consider outpatient workup.    Diet: Normal VTE: Heparin IVF: None,None Code: DNR  Prior to Admission Living Arrangement: Home, living Wife Anticipated Discharge Location: Home Barriers to Discharge: Medical management   Dispo: Admit patient to Observation with expected length of stay less than 2 midnights.  SignedOlegario Messier, MD Internal Medicine Resident PGY-2  12/31/2022, 11:46 PM

## 2023-01-01 ENCOUNTER — Encounter (HOSPITAL_COMMUNITY): Payer: Self-pay | Admitting: Internal Medicine

## 2023-01-01 ENCOUNTER — Observation Stay (HOSPITAL_COMMUNITY): Payer: HMO

## 2023-01-01 DIAGNOSIS — Z87891 Personal history of nicotine dependence: Secondary | ICD-10-CM | POA: Diagnosis not present

## 2023-01-01 DIAGNOSIS — Z66 Do not resuscitate: Secondary | ICD-10-CM | POA: Diagnosis present

## 2023-01-01 DIAGNOSIS — S86911A Strain of unspecified muscle(s) and tendon(s) at lower leg level, right leg, initial encounter: Secondary | ICD-10-CM | POA: Diagnosis present

## 2023-01-01 DIAGNOSIS — J9611 Chronic respiratory failure with hypoxia: Secondary | ICD-10-CM | POA: Diagnosis present

## 2023-01-01 DIAGNOSIS — Z79899 Other long term (current) drug therapy: Secondary | ICD-10-CM | POA: Diagnosis not present

## 2023-01-01 DIAGNOSIS — I5032 Chronic diastolic (congestive) heart failure: Secondary | ICD-10-CM | POA: Diagnosis present

## 2023-01-01 DIAGNOSIS — R410 Disorientation, unspecified: Secondary | ICD-10-CM | POA: Diagnosis not present

## 2023-01-01 DIAGNOSIS — G928 Other toxic encephalopathy: Secondary | ICD-10-CM | POA: Diagnosis present

## 2023-01-01 DIAGNOSIS — Z9981 Dependence on supplemental oxygen: Secondary | ICD-10-CM | POA: Diagnosis not present

## 2023-01-01 DIAGNOSIS — S76812A Strain of other specified muscles, fascia and tendons at thigh level, left thigh, initial encounter: Secondary | ICD-10-CM | POA: Diagnosis present

## 2023-01-01 DIAGNOSIS — R4182 Altered mental status, unspecified: Secondary | ICD-10-CM | POA: Diagnosis present

## 2023-01-01 DIAGNOSIS — C7951 Secondary malignant neoplasm of bone: Secondary | ICD-10-CM | POA: Diagnosis present

## 2023-01-01 DIAGNOSIS — Z801 Family history of malignant neoplasm of trachea, bronchus and lung: Secondary | ICD-10-CM | POA: Diagnosis not present

## 2023-01-01 DIAGNOSIS — Z7982 Long term (current) use of aspirin: Secondary | ICD-10-CM | POA: Diagnosis not present

## 2023-01-01 DIAGNOSIS — Z87442 Personal history of urinary calculi: Secondary | ICD-10-CM | POA: Diagnosis not present

## 2023-01-01 DIAGNOSIS — C3490 Malignant neoplasm of unspecified part of unspecified bronchus or lung: Secondary | ICD-10-CM | POA: Diagnosis present

## 2023-01-01 DIAGNOSIS — Z981 Arthrodesis status: Secondary | ICD-10-CM | POA: Diagnosis not present

## 2023-01-01 DIAGNOSIS — W182XXA Fall in (into) shower or empty bathtub, initial encounter: Secondary | ICD-10-CM | POA: Diagnosis present

## 2023-01-01 DIAGNOSIS — D631 Anemia in chronic kidney disease: Secondary | ICD-10-CM | POA: Diagnosis present

## 2023-01-01 DIAGNOSIS — E876 Hypokalemia: Secondary | ICD-10-CM | POA: Diagnosis present

## 2023-01-01 DIAGNOSIS — Z7985 Long-term (current) use of injectable non-insulin antidiabetic drugs: Secondary | ICD-10-CM | POA: Diagnosis not present

## 2023-01-01 DIAGNOSIS — N184 Chronic kidney disease, stage 4 (severe): Secondary | ICD-10-CM | POA: Diagnosis present

## 2023-01-01 DIAGNOSIS — M79604 Pain in right leg: Secondary | ICD-10-CM | POA: Diagnosis present

## 2023-01-01 DIAGNOSIS — E1122 Type 2 diabetes mellitus with diabetic chronic kidney disease: Secondary | ICD-10-CM | POA: Diagnosis present

## 2023-01-01 DIAGNOSIS — Z794 Long term (current) use of insulin: Secondary | ICD-10-CM | POA: Diagnosis not present

## 2023-01-01 DIAGNOSIS — Z880 Allergy status to penicillin: Secondary | ICD-10-CM | POA: Diagnosis not present

## 2023-01-01 DIAGNOSIS — G8929 Other chronic pain: Secondary | ICD-10-CM | POA: Diagnosis present

## 2023-01-01 DIAGNOSIS — Y92012 Bathroom of single-family (private) house as the place of occurrence of the external cause: Secondary | ICD-10-CM | POA: Diagnosis not present

## 2023-01-01 DIAGNOSIS — Z8049 Family history of malignant neoplasm of other genital organs: Secondary | ICD-10-CM | POA: Diagnosis not present

## 2023-01-01 DIAGNOSIS — T402X5A Adverse effect of other opioids, initial encounter: Secondary | ICD-10-CM | POA: Diagnosis present

## 2023-01-01 LAB — GLUCOSE, CAPILLARY
Glucose-Capillary: 173 mg/dL — ABNORMAL HIGH (ref 70–99)
Glucose-Capillary: 167 mg/dL — ABNORMAL HIGH (ref 70–99)
Glucose-Capillary: 168 mg/dL — ABNORMAL HIGH (ref 70–99)
Glucose-Capillary: 93 mg/dL (ref 70–99)
Glucose-Capillary: 132 mg/dL — ABNORMAL HIGH (ref 70–99)
Glucose-Capillary: 119 mg/dL — ABNORMAL HIGH (ref 70–99)

## 2023-01-01 LAB — BASIC METABOLIC PANEL
Anion gap: 12 (ref 5–15)
BUN: 33 mg/dL — ABNORMAL HIGH (ref 8–23)
CO2: 27 mmol/L (ref 22–32)
Calcium: 8.8 mg/dL — ABNORMAL LOW (ref 8.9–10.3)
Chloride: 103 mmol/L (ref 98–111)
Creatinine, Ser: 3.65 mg/dL — ABNORMAL HIGH (ref 0.61–1.24)
GFR, Estimated: 16 mL/min — ABNORMAL LOW (ref >60)
Glucose, Bld: 164 mg/dL — ABNORMAL HIGH (ref 70–99)
Potassium: 3.4 mmol/L — ABNORMAL LOW (ref 3.5–5.1)
Sodium: 142 mmol/L (ref 135–145)

## 2023-01-01 LAB — CBC
HCT: 31.8 % — ABNORMAL LOW (ref 39.0–52.0)
HCT: 32.8 % — ABNORMAL LOW (ref 39.0–52.0)
Hemoglobin: 10.3 g/dL — ABNORMAL LOW (ref 13.0–17.0)
Hemoglobin: 10.5 g/dL — ABNORMAL LOW (ref 13.0–17.0)
MCH: 29.3 pg (ref 26.0–34.0)
MCH: 29.9 pg (ref 26.0–34.0)
MCHC: 32 g/dL (ref 30.0–36.0)
MCHC: 32.4 g/dL (ref 30.0–36.0)
MCV: 91.6 fL (ref 80.0–100.0)
MCV: 92.4 fL (ref 80.0–100.0)
Platelets: 209 10*3/uL (ref 150–400)
Platelets: 211 10*3/uL (ref 150–400)
RBC: 3.44 MIL/uL — ABNORMAL LOW (ref 4.22–5.81)
RBC: 3.58 MIL/uL — ABNORMAL LOW (ref 4.22–5.81)
RDW: 14.2 % (ref 11.5–15.5)
RDW: 14.4 % (ref 11.5–15.5)
WBC: 15 10*3/uL — ABNORMAL HIGH (ref 4.0–10.5)
WBC: 15.4 10*3/uL — ABNORMAL HIGH (ref 4.0–10.5)
nRBC: 0 % (ref 0.0–0.2)
nRBC: 0 % (ref 0.0–0.2)

## 2023-01-01 LAB — CREATININE, SERUM
Creatinine, Ser: 3.71 mg/dL — ABNORMAL HIGH (ref 0.61–1.24)
GFR, Estimated: 16 mL/min — ABNORMAL LOW (ref >60)

## 2023-01-01 MED ORDER — OXYCODONE HCL 5 MG PO TABS
5.0000 mg | ORAL_TABLET | Freq: Two times a day (BID) | ORAL | Status: DC | PRN
  Administered 2023-01-01: 5 mg via ORAL
  Filled 2023-01-01: qty 1

## 2023-01-01 MED ORDER — ACETAMINOPHEN 500 MG PO TABS
1000.0000 mg | ORAL_TABLET | Freq: Four times a day (QID) | ORAL | Status: DC
  Administered 2023-01-01 – 2023-01-02 (×5): 1000 mg via ORAL
  Filled 2023-01-01 (×6): qty 2

## 2023-01-01 MED ORDER — TRIMETHOBENZAMIDE HCL 100 MG/ML IM SOLN
200.0000 mg | Freq: Once | INTRAMUSCULAR | Status: AC | PRN
  Administered 2023-01-01: 200 mg via INTRAMUSCULAR
  Filled 2023-01-01: qty 2

## 2023-01-01 MED ORDER — ONDANSETRON HCL 4 MG/2ML IJ SOLN: 4.0000 mg | Freq: Three times a day (TID) | INTRAMUSCULAR | Status: DC | PRN

## 2023-01-01 MED ORDER — LIDOCAINE 5 % EX PTCH
1.0000 | MEDICATED_PATCH | CUTANEOUS | Status: DC
  Administered 2023-01-01 – 2023-01-02 (×2): 1 via TRANSDERMAL
  Filled 2023-01-01 (×2): qty 1

## 2023-01-01 MED ORDER — SERTRALINE HCL 100 MG PO TABS
100.0000 mg | ORAL_TABLET | Freq: Every morning | ORAL | Status: DC
  Administered 2023-01-01 – 2023-01-02 (×2): 100 mg via ORAL
  Filled 2023-01-01 (×2): qty 1

## 2023-01-01 MED ORDER — ONDANSETRON HCL 4 MG/2ML IJ SOLN
4.0000 mg | Freq: Once | INTRAMUSCULAR | Status: AC
  Administered 2023-01-01: 4 mg via INTRAVENOUS
  Filled 2023-01-01: qty 2

## 2023-01-01 MED ORDER — INSULIN ASPART 100 UNIT/ML IJ SOLN
0.0000 [IU] | Freq: Three times a day (TID) | INTRAMUSCULAR | Status: DC
  Administered 2023-01-01: 1 [IU] via SUBCUTANEOUS
  Administered 2023-01-01: 2 [IU] via SUBCUTANEOUS
  Administered 2023-01-02: 1 [IU] via SUBCUTANEOUS

## 2023-01-01 MED ORDER — GABAPENTIN 100 MG PO CAPS
100.0000 mg | ORAL_CAPSULE | Freq: Three times a day (TID) | ORAL | Status: DC
  Administered 2023-01-01 – 2023-01-02 (×3): 100 mg via ORAL
  Filled 2023-01-01 (×3): qty 1

## 2023-01-01 MED ORDER — HYDROXYZINE HCL 25 MG PO TABS
25.0000 mg | ORAL_TABLET | Freq: Three times a day (TID) | ORAL | Status: DC | PRN
  Administered 2023-01-01 – 2023-01-02 (×2): 25 mg via ORAL
  Filled 2023-01-01 (×2): qty 1

## 2023-01-01 NOTE — Progress Notes (Signed)
HD#0 Subjective:  Overnight Events: No acute event overnight.  Patient was seen at the bedside.  Right knee pain worse in the posterior knee.  Otherwise he denies cough or dysuria or increased urinary frequency.    Objective:  Vital signs in last 24 hours: Vitals:   01/01/23 0255 01/01/23 0517 01/01/23 0638 01/01/23 0802  BP: 122/63 (!) 173/72  (!) 164/89  Pulse:  91 88 85  Resp:    18  Temp:  98 F (36.7 C) 98.6 F (37 C) 98 F (36.7 C)  TempSrc:  Oral Oral   SpO2:  96% 97% 96%  Weight:      Height:       Supplemental O2: Nasal Cannula SpO2: 96 % O2 Flow Rate (L/min): 2 L/min   Physical Exam:   General: Well-appearing, not in acute distress. CV: RRR. No m/r/g. No LE edema Pulmonary: Lungs CTAB. Normal effort. No wheezing or rales. Abdominal: Soft, nontender, nondistended. Normal bowel sounds. Extremities: Palpable dorsalis pedis and radialis pulses. Normal ROM. MSK: Right posterior knee tender to palpation.  No edema or erythema. Neuro: A&Ox3. Moves all extremities. Normal sensation. No focal deficit.   Filed Weights   12/31/22 1417  Weight: 84.7 kg    No intake or output data in the 24 hours ending 01/01/23 1532 Net IO Since Admission: No IO data has been entered for this period [01/01/23 1532]  Recent Labs    01/01/23 0550 01/01/23 0945 01/01/23 1205  GLUCAP 167* 168* 93     Pertinent Labs:    Latest Ref Rng & Units 01/01/2023   12:42 AM 12/31/2022   11:38 PM 12/31/2022    8:20 PM  CBC  WBC 4.0 - 10.5 K/uL 15.4  15.0    Hemoglobin 13.0 - 17.0 g/dL 36.6  44.0  34.7   Hematocrit 39.0 - 52.0 % 32.8  31.8  33.0   Platelets 150 - 400 K/uL 211  209         Latest Ref Rng & Units 01/01/2023   12:42 AM 12/31/2022   11:38 PM 12/31/2022    8:20 PM  CMP  Glucose 70 - 99 mg/dL 425   956   BUN 8 - 23 mg/dL 33   33   Creatinine 3.87 - 1.24 mg/dL 5.64  3.32  9.51   Sodium 135 - 145 mmol/L 142   143   Potassium 3.5 - 5.1 mmol/L 3.4   3.2   Chloride 98  - 111 mmol/L 103   102   CO2 22 - 32 mmol/L 27     Calcium 8.9 - 10.3 mg/dL 8.8       Imaging: DG Abd 1 View  Result Date: 01/01/2023 CLINICAL DATA:  Abdominal pain. EXAM: ABDOMEN - 1 VIEW COMPARISON:  PET-CT, 12/16/2022. FINDINGS: Normal bowel gas pattern. 1.1 cm calculus projects in the right kidney, stable from the prior PET-CT. No left intrarenal stone or evidence of a ureteral stone. Elevated right hemidiaphragm, also stable. No acute skeletal abnormality. Previous posterior lumbar spine fusion at L4-L5 and previous vertebroplasty at L3, L4 and L5. IMPRESSION: 1. No acute findings. 2. Right intrarenal calculus. Electronically Signed   By: Amie Portland M.D.   On: 01/01/2023 10:29   VAS [place] LOWER EXTREMITY VENOUS (DVT) (7a-7p)  Result Date: 01/01/2023  Lower Venous DVT Study Patient Name:  Jeffery Fernandez  Date of Exam:   12/31/2022 Medical Rec #: [id number]      Accession #:  [id number] Date of Birth: 10-28-1945       Patient Gender: M Patient Age:   77 years Exam Location:  [facility] Procedure:      VAS [place] LOWER EXTREMITY VENOUS (DVT) Referring Phys: [facility]ED[facility] --------------------------------------------------------------------------------  Indications: Pain.  Limitations: Patient movement due to pain. Comparison Study: No prior exam. Performing Technologist: Fernande Bras  Examination Guidelines: A complete evaluation includes B-mode imaging, spectral Doppler, color Doppler, and power Doppler as needed of all accessible portions of each vessel. Bilateral testing is considered an integral part of a complete examination. Limited examinations for reoccurring indications may be performed as noted. The reflux portion of the exam is performed with the patient in reverse Trendelenburg.  +---------+---------------+---------+-----------+----------+----------------+ RIGHT    CompressibilityPhasicitySpontaneityPropertiesThrombus Aging    +---------+---------------+---------+-----------+----------+----------------+ CFV      Full           Yes      Yes                                   +---------+---------------+---------+-----------+----------+----------------+ SFJ      Full                                                          +---------+---------------+---------+-----------+----------+----------------+ FV Prox  Full                                                          +---------+---------------+---------+-----------+----------+----------------+ FV Mid   Full                                                          +---------+---------------+---------+-----------+----------+----------------+ FV DistalFull                                                          +---------+---------------+---------+-----------+----------+----------------+ PFV      Full                                                          +---------+---------------+---------+-----------+----------+----------------+ POP      Full           Yes      Yes                                   +---------+---------------+---------+-----------+----------+----------------+ PTV      Full                                                          +---------+---------------+---------+-----------+----------+----------------+  PERO                                                  Patent by color. +---------+---------------+---------+-----------+----------+----------------+   +----+---------------+---------+-----------+----------+--------------+ LEFTCompressibilityPhasicitySpontaneityPropertiesThrombus Aging +----+---------------+---------+-----------+----------+--------------+ CFV Full           Yes      Yes                                 +----+---------------+---------+-----------+----------+--------------+     Summary: RIGHT: - There is no evidence of deep vein thrombosis in the lower extremity.  - No cystic  structure found in the popliteal fossa.  LEFT: - No evidence of common femoral vein obstruction.   *See table(s) above for measurements and observations.    Preliminary    DG Knee 1-2 Views Right  Result Date: 12/31/2022 CLINICAL DATA:  Right hip pain and right leg pain worse since yesterday. Difficulty walking. EXAM: RIGHT KNEE - 1-2 VIEW COMPARISON:  None Available. FINDINGS: Mild degenerative changes in the right knee with mild medial compartment narrowing. No evidence of acute fracture or dislocation. No focal bone lesion or bone destruction. No significant effusions. Vascular calcifications in the soft tissues. IMPRESSION: Mild degenerative changes in the right knee with medial compartment narrowing. No acute bony abnormalities. Electronically Signed   By: Burman Nieves M.D.   On: 12/31/2022 22:31   DG HIP UNILAT WITH PELVIS 1V RIGHT  Result Date: 12/31/2022 CLINICAL DATA:  Right hip pain with difficulty walking. History of lung cancer. EXAM: DG HIP (WITH OR WITHOUT PELVIS) 1V RIGHT COMPARISON:  PET-CT 12/16/2022 FINDINGS: Mild degenerative changes in the right hip with narrowing and sclerosis of the superior acetabulum and small osteophyte formation. The hip is rotated, limiting evaluation of the femoral neck but no acute displaced fractures are identified as visualized. No focal bone lesion or bone destruction. Pelvis and left hip appear intact. SI joints and symphysis pubis are not displaced. Postoperative changes in the lower lumbar spine. Vascular calcifications. IMPRESSION: Mild degenerative changes in the right hip. No acute displaced fractures are identified. Electronically Signed   By: Burman Nieves M.D.   On: 12/31/2022 22:30   CT Head Wo Contrast  Result Date: 12/31/2022 CLINICAL DATA:  Mental status change EXAM: CT HEAD WITHOUT CONTRAST TECHNIQUE: Contiguous axial images were obtained from the base of the skull through the vertex without intravenous contrast. RADIATION DOSE  REDUCTION: This exam was performed according to the departmental dose-optimization program which includes automated exposure control, adjustment of the mA and/or kV according to patient size and/or use of iterative reconstruction technique. COMPARISON:  05/13/2020 CT head FINDINGS: Brain: No evidence of acute infarction, hemorrhage, mass, mass effect, or midline shift. No hydrocephalus or extra-axial fluid collection. Periventricular white matter changes, likely the sequela of chronic small vessel ischemic disease. Mildly advanced cerebral atrophy for age. Basal ganglia calcifications. Vascular: No hyperdense vessel. Atherosclerotic calcifications in the intracranial carotid and vertebral arteries. Skull: Negative for fracture or focal lesion. Sinuses/Orbits: No acute finding. Other: The mastoid air cells are well aerated. IMPRESSION: No acute intracranial process. Electronically Signed   By: Wiliam Ke M.D.   On: 12/31/2022 19:42   DG Chest Portable 1 View  Result Date: 12/31/2022 CLINICAL DATA:  Altered mental status, shortness of breath. EXAM: PORTABLE CHEST 1  VIEW COMPARISON:  December 01, 2022. FINDINGS: Stable cardiomegaly. Left lung is clear. Minimal right basilar subsegmental atelectasis is noted with small pleural effusion. Bony thorax is unremarkable. IMPRESSION: Minimal right basilar subsegmental atelectasis with small right pleural effusion. Electronically Signed   By: Lupita Raider M.D.   On: 12/31/2022 19:37    Assessment/Plan:   Principal Problem:   Altered mental status Active Problems:   Type 2 diabetes mellitus, with long-term current use of insulin (HCC)   CKD (chronic kidney disease) stage 4, GFR 15-29 ml/min (HCC)   AMS (altered mental status)   Muscle strain of knee, right, initial encounter   Chronic hypoxemic respiratory failure (HCC)   Patient Summary:  Jeffery Fernandez is a 77 y.o. person living with a history of suspected primary lung malignancy, HTN, T2DM who  presented with right leg pain and admitted for altered mental status.  #Altered Mental Status He is alert, oriented x 3. He remains afebrile, WBC 15.0>>15.4.  Denies cough, or urinary symptoms. No clear source of infection.  He does have a history of primary lung malignancy, so that can explain the leukocytosis.   I suspect patient's AMS possibly secondary to toxic metabolite, from central acting  home medicines in the setting of reduced renal function.  All central acting medicines were held on presentation.  This afternoon however, patient appears hyperactive.  I query if patient is having withdrawal from central acting meds.  Will restart home Zoloft and gabapentin.  Plan:  - Restarted Home zoloft 100 mg. - Restarted Gabapentin 100 mg  TID  - Hold Norco - Hold Wellbutrin.   #Right Leg Pain Right knee without effusion, rubor or erythema.  Knee is tender to palpation in the right posterior.  Low suspicion for septic arthritis.  I suspect right knee pain is likely due to a pulled muscle.  DVT studies were normal.  X-ray of the knee, and the hip negative for acute fracture.  Will treat the right knee pain conservatively with lidocaine patch, Tylenol and PT/OT.   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    #HFpEF Echo in 10/28 showed an ejection fraction of 55-60%. Does not appear volume overloaded on exam.    #CKD Stage 4 Current creatinine at 3.32, and GFR at 18, seems consistent with baseline.    #Type 2 DM Last A1c 6.2, home meds include Trulicity 1.5mg  weekly, and Lantus 40U. Will manage with sliding scale for now until patient is more awake and eating more.    #Hypokalemia  Potassium at 3.1 on BMP, will replete as able.    #Normocytic Anemia  Hb at 10.5,  relatively consistent with previous values. Could be related to kidney disease, however last iron studies I can see are from 2021. Consider outpatient workup.    Diet: Regular diet VTE: heparin injection 5,000 Units Start: 12/31/22 2200 Code: Full PT/OT: Pending ID:   Anticipated discharge to Home tomorrow .  Laretta Bolster, MD 01/01/2023, 3:32 PM Pager: [phone] [facility]  Please contact the on call pager after 5 pm and on weekends at [phone].

## 2023-01-01 NOTE — ED Notes (Signed)
ED TO INPATIENT HANDOFF REPORT  ED Nurse Name and Phone #: Rodney Booze [phone]  S Name/Age/Gender Jeffery Fernandez 77 y.o. male Room/Bed: 038C/038C  Code Status   Code Status: Limited: Do not attempt resuscitation (DNR) -DNR-LIMITED -Do Not Intubate/DNI   Home/SNF/Other Home Patient oriented to: self, place, time, and situation Is this baseline? Yes   Triage Complete: Triage complete  Chief Complaint Altered mental status [R41.82]  Triage Note Pt here for R leg pain that has worsened since yesterday with difficulty walking due to the pain. Hx of lung cancer, on oxygen PRN. 92% on RA in triage.    Allergies Allergies  Allergen Reactions   Amlodipine Swelling    Patient report facial swelling at high doses   Penicillins Other (See Comments)    Possibly rash - not sure Has patient had a PCN reaction causing immediate rash, facial/tongue/throat swelling, SOB or lightheadedness with hypotension:unknown Has patient had a PCN reaction causing severe rash involving mucus membranes or skin necrosis: Unknown Has patient had a PCN reaction that required hospitalization: Unknown Has patient had a PCN reaction occurring within the last 10 years: No Childhood reaction If all of the above answers are "NO", then may proceed with Cephalosporin use.    Hydralazine Other (See Comments)    Dizziness    Level of Care/Admitting Diagnosis ED Disposition     ED Disposition  Admit   Condition  --   Comment  Hospital Area: [facility] [[id number]]  Level of Care: Med-Surg [16]  May place patient in observation at [facility] or [facility] if equivalent level of care is available:: No  Covid Evaluation: Asymptomatic - no recent exposure (last 10 days) testing not required  Diagnosis: Altered mental status [780.97.ICD-9-CM]  Admitting Physician: Gust Rung [[id number]]  Attending Physician: Gust Rung [[id number]]          B Medical/Surgery History Past Medical History:   Diagnosis Date   Arrhythmia    Arthritis    Cataract    bilateral cataract extraction with intraoccular lens implants   Chest pain, unspecified    Chronic airway obstruction, not elsewhere classified    PT. DENIES HE HAS COPD   CKD (chronic kidney disease)    Colon polyps    Adenomatous Polyps 2007   Depression    Diabetes mellitus    Diverticulosis    Gout    Heart murmur    History of kidney stones    Hyperlipemia    Hypertension    Nephrolithiasis    Normocytic anemia 04/23/2017   Obstructive sleep apnea (adult) (pediatric)    Other and unspecified hyperlipidemia    Other left bundle branch block    Retinal vascular occlusion, unspecified    right   Shortness of breath    on exertion   Unspecified sleep apnea    Vision loss of right eye    White coat syndrome with diagnosis of hypertension 09/19/2017   Past Surgical History:  Procedure Laterality Date   ANTERIOR CERVICAL DECOMP/DISCECTOMY FUSION N/A 06/26/2016   Procedure: Re-Exploration of Cervical Wound;  Surgeon: Donalee Citrin, MD;  Location[facility] OR;  Service: Neurosurgery;  Laterality: N/A;   ANTERIOR CERVICAL DECOMP/DISCECTOMY FUSION N/A 06/26/2016   Procedure: ANTERIOR CERVICAL DECOMPRESSION/DISCECTOMY FUSION, INTERBODY PROSTESIS, PLATE, CERVICAL THREE CERVICAL FOUR, CERVICAL FOUR CERVICAL FIVE CERVICAL SIX;  Surgeon: Tressie Stalker, MD;  Location: [facility] OR;  Service: Neurosurgery;  Laterality: N/A;   APPLICATION OF WOUND VAC  06/05/2020   Procedure:  APPLICATION OF WOUND VAC;  Surgeon: Tressie Stalker, MD;  Location: [facility] OR;  Service: Neurosurgery;;   BACK SURGERY     CATARACT EXTRACTION Bilateral    KIDNEY STONE SURGERY     KYPHOPLASTY N/A 01/19/2018   Procedure: KYPHOPLASTY LUMBAR THREE;  Surgeon: Tressie Stalker, MD;  Location: [facility] OR;  Service: Neurosurgery;  Laterality: N/A;  KYPHOPLASTY LUMBAR THREE   LUMBAR WOUND DEBRIDEMENT N/A 06/05/2020   Procedure: LUMBAR WOUND DEBRIDEMENT;  Surgeon: Tressie Stalker, MD;   Location: [facility] OR;  Service: Neurosurgery;  Laterality: N/A;     A IV Location/Drains/Wounds Patient Lines/Drains/Airways Status     Active Line/Drains/Airways     Name Placement date Placement time Site Days   Peripheral IV 12/31/22 18 G Right Antecubital 12/31/22  1445  Antecubital  1   Peripheral IV 12/31/22 18 G Posterior;Right Forearm 12/31/22  1805  Forearm  1            Intake/Output Last 24 hours No intake or output data in the 24 hours ending 01/01/23 0057  Labs/Imaging Results for orders placed or performed during the hospital encounter of 12/31/22 (from the past 48 hour(s))  CBC with Differential     Status: Abnormal   Collection Time: 12/31/22  2:46 PM  Result Value Ref Range   WBC 19.3 (H) 4.0 - 10.5 K/uL   RBC 3.86 (L) 4.22 - 5.81 MIL/uL   Hemoglobin 11.3 (L) 13.0 - 17.0 g/dL   HCT 84.6 (L) 96.2 - 95.2 %   MCV 92.0 80.0 - 100.0 fL   MCH 29.3 26.0 - 34.0 pg   MCHC 31.8 30.0 - 36.0 g/dL   RDW 84.1 32.4 - 40.1 %   Platelets 278 150 - 400 K/uL   nRBC 0.0 0.0 - 0.2 %   Neutrophils Relative % 75 %   Neutro Abs 14.6 (H) 1.7 - 7.7 K/uL   Lymphocytes Relative 16 %   Lymphs Abs 3.0 0.7 - 4.0 K/uL   Monocytes Relative 6 %   Monocytes Absolute 1.2 (H) 0.1 - 1.0 K/uL   Eosinophils Relative 1 %   Eosinophils Absolute 0.1 0.0 - 0.5 K/uL   Basophils Relative 1 %   Basophils Absolute 0.1 0.0 - 0.1 K/uL   Immature Granulocytes 1 %   Abs Immature Granulocytes 0.12 (H) 0.00 - 0.07 K/uL    Comment: Performed at [facility], [address], [place], [place] [address]  Basic metabolic panel     Status: Abnormal   Collection Time: 12/31/22  2:46 PM  Result Value Ref Range   Sodium 139 135 - 145 mmol/L   Potassium 3.1 (L) 3.5 - 5.1 mmol/L   Chloride 100 98 - 111 mmol/L   CO2 25 22 - 32 mmol/L   Glucose, Bld 137 (H) 70 - 99 mg/dL    Comment: Glucose reference range applies only to samples taken after fasting for at least 8 hours.   BUN 30 (H) 8 - 23 mg/dL    Creatinine, Ser 3.66 (H) 0.61 - 1.24 mg/dL   Calcium 9.1 8.9 - 44.0 mg/dL   GFR, Estimated 18 (L) >60 mL/min    Comment: (NOTE) Calculated using the CKD-EPI Creatinine Equation (2021)    Anion gap 14 5 - 15    Comment: Performed at [facility], [address] [place], [place] [address]  I-stat chem 8, ED (not at [facility] or [facility])     Status: Abnormal   Collection Time: 12/31/22  2:51 PM  Result Value Ref Range   Sodium 142 135 - 145 mmol/L   Potassium 3.2 (L) 3.5 - 5.1 mmol/L   Chloride 101 98 - 111 mmol/L   BUN 30 (H) 8 - 23 mg/dL   Creatinine, Ser 5.95 (H) 0.61 - 1.24 mg/dL   Glucose, Bld 638 (H) 70 - 99 mg/dL    Comment: Glucose reference range applies only to samples taken after fasting for at least 8 hours.   Calcium, Ion 1.07 (L) 1.15 - 1.40 mmol/L   TCO2 25 22 - 32 mmol/L   Hemoglobin 12.2 (L) 13.0 - 17.0 g/dL   HCT 75.6 (L) 43.3 - 29.5 %  CBG monitoring, ED     Status: Abnormal   Collection Time: 12/31/22  5:38 PM  Result Value Ref Range   Glucose-Capillary 164 (H) 70 - 99 mg/dL    Comment: Glucose reference range applies only to samples taken after fasting for at least 8 hours.   Comment 1 Notify RN    Comment 2 Document in Chart   Protime-INR     Status: Abnormal   Collection Time: 12/31/22  5:45 PM  Result Value Ref Range   Prothrombin Time 15.3 (H) 11.4 - 15.2 seconds   INR 1.2 0.8 - 1.2    Comment: (NOTE) INR goal varies based on device and disease states. Performed at [facility], [address] [place], [place] [address]   APTT     Status: None   Collection Time: 12/31/22  5:45 PM  Result Value Ref Range   aPTT 32 24 - 36 seconds    Comment: Performed at [facility], [address], [place], [place] [address]  Hepatic function panel     Status: Abnormal   Collection Time: 12/31/22  5:45 PM  Result Value Ref Range   Total Protein 8.2 (H) 6.5 - 8.1 g/dL   Albumin 3.2 (L) 3.5 - 5.0 g/dL   AST 15 15 - 41 U/L   ALT 12 0 - 44 U/L    Alkaline Phosphatase 94 38 - 126 U/L   Total Bilirubin 0.7 <1.2 mg/dL   Bilirubin, Direct 0.1 0.0 - 0.2 mg/dL   Indirect Bilirubin 0.6 0.3 - 0.9 mg/dL    Comment: Performed at [facility], [address], [place], [place] [address]  I-Stat Lactic Acid, ED     Status: Abnormal   Collection Time: 12/31/22  6:20 PM  Result Value Ref Range   Lactic Acid, Venous 2.5 (HH) 0.5 - 1.9 mmol/L   Comment NOTIFIED PHYSICIAN   CBG monitoring, ED     Status: Abnormal   Collection Time: 12/31/22  8:17 PM  Result Value Ref Range   Glucose-Capillary 166 (H) 70 - 99 mg/dL    Comment: Glucose reference range applies only to samples taken after fasting for at least 8 hours.  I-stat chem 8, ed     Status: Abnormal   Collection Time: 12/31/22  8:20 PM  Result Value Ref Range   Sodium 143 135 - 145 mmol/L   Potassium 3.2 (L) 3.5 - 5.1 mmol/L   Chloride 102 98 - 111 mmol/L   BUN 33 (H) 8 - 23 mg/dL   Creatinine, Ser 0.93 (H) 0.61 - 1.24 mg/dL   Glucose, Bld 235 (H) 70 - 99 mg/dL    Comment: Glucose reference range applies only to samples taken after fasting for at least 8 hours.   Calcium, Ion 1.09 (L) 1.15 -  1.40 mmol/L   TCO2 25 22 - 32 mmol/L   Hemoglobin 11.2 (L) 13.0 - 17.0 g/dL   HCT 57.8 (L) 46.9 - 62.9 %  I-Stat Lactic Acid, ED     Status: None   Collection Time: 12/31/22  8:29 PM  Result Value Ref Range   Lactic Acid, Venous 1.0 0.5 - 1.9 mmol/L  CBC     Status: Abnormal   Collection Time: 12/31/22 11:38 PM  Result Value Ref Range   WBC 15.0 (H) 4.0 - 10.5 K/uL   RBC 3.44 (L) 4.22 - 5.81 MIL/uL   Hemoglobin 10.3 (L) 13.0 - 17.0 g/dL   HCT 52.8 (L) 41.3 - 24.4 %   MCV 92.4 80.0 - 100.0 fL   MCH 29.9 26.0 - 34.0 pg   MCHC 32.4 30.0 - 36.0 g/dL   RDW 01.0 27.2 - 53.6 %   Platelets 209 150 - 400 K/uL   nRBC 0.0 0.0 - 0.2 %    Comment: Performed at [facility], [address], [place], [place] [address]  Creatinine, serum     Status: Abnormal   Collection Time: 12/31/22 11:38  PM  Result Value Ref Range   Creatinine, Ser 3.71 (H) 0.61 - 1.24 mg/dL   GFR, Estimated 16 (L) >60 mL/min    Comment: (NOTE) Calculated using the CKD-EPI Creatinine Equation (2021) Performed at [facility], [address] [place], [place] [address]   I-Stat CG4 Lactic Acid, ED     Status: None   Collection Time: 12/31/22 11:50 PM  Result Value Ref Range   Lactic Acid, Venous 0.6 0.5 - 1.9 mmol/L  CBC     Status: Abnormal   Collection Time: 01/01/23 12:42 AM  Result Value Ref Range   WBC 15.4 (H) 4.0 - 10.5 K/uL   RBC 3.58 (L) 4.22 - 5.81 MIL/uL   Hemoglobin 10.5 (L) 13.0 - 17.0 g/dL   HCT 95.6 (L) 38.7 - 56.4 %   MCV 91.6 80.0 - 100.0 fL   MCH 29.3 26.0 - 34.0 pg   MCHC 32.0 30.0 - 36.0 g/dL   RDW 33.2 95.1 - 88.4 %   Platelets 211 150 - 400 K/uL   nRBC 0.0 0.0 - 0.2 %    Comment: Performed at [facility], [address] [place], [place] [address]   DG Knee 1-2 Views Right  Result Date: 12/31/2022 CLINICAL DATA:  Right hip pain and right leg pain worse since yesterday. Difficulty walking. EXAM: RIGHT KNEE - 1-2 VIEW COMPARISON:  None Available. FINDINGS: Mild degenerative changes in the right knee with mild medial compartment narrowing. No evidence of acute fracture or dislocation. No focal bone lesion or bone destruction. No significant effusions. Vascular calcifications in the soft tissues. IMPRESSION: Mild degenerative changes in the right knee with medial compartment narrowing. No acute bony abnormalities. Electronically Signed   By: Burman Nieves M.D.   On: 12/31/2022 22:31   DG HIP UNILAT WITH PELVIS 1V RIGHT  Result Date: 12/31/2022 CLINICAL DATA:  Right hip pain with difficulty walking. History of lung cancer. EXAM: DG HIP (WITH OR WITHOUT PELVIS) 1V RIGHT COMPARISON:  PET-CT 12/16/2022 FINDINGS: Mild degenerative changes in the right hip with narrowing and sclerosis of the superior acetabulum and small osteophyte formation. The hip is rotated, limiting  evaluation of the femoral neck but no acute displaced fractures are identified as visualized. No focal bone lesion or bone destruction. Pelvis and left hip appear intact. SI joints  and symphysis pubis are not displaced. Postoperative changes in the lower lumbar spine. Vascular calcifications. IMPRESSION: Mild degenerative changes in the right hip. No acute displaced fractures are identified. Electronically Signed   By: Burman Nieves M.D.   On: 12/31/2022 22:30   CT Head Wo Contrast  Result Date: 12/31/2022 CLINICAL DATA:  Mental status change EXAM: CT HEAD WITHOUT CONTRAST TECHNIQUE: Contiguous axial images were obtained from the base of the skull through the vertex without intravenous contrast. RADIATION DOSE REDUCTION: This exam was performed according to the departmental dose-optimization program which includes automated exposure control, adjustment of the mA and/or kV according to patient size and/or use of iterative reconstruction technique. COMPARISON:  05/13/2020 CT head FINDINGS: Brain: No evidence of acute infarction, hemorrhage, mass, mass effect, or midline shift. No hydrocephalus or extra-axial fluid collection. Periventricular white matter changes, likely the sequela of chronic small vessel ischemic disease. Mildly advanced cerebral atrophy for age. Basal ganglia calcifications. Vascular: No hyperdense vessel. Atherosclerotic calcifications in the intracranial carotid and vertebral arteries. Skull: Negative for fracture or focal lesion. Sinuses/Orbits: No acute finding. Other: The mastoid air cells are well aerated. IMPRESSION: No acute intracranial process. Electronically Signed   By: Wiliam Ke M.D.   On: 12/31/2022 19:42   DG Chest Portable 1 View  Result Date: 12/31/2022 CLINICAL DATA:  Altered mental status, shortness of breath. EXAM: PORTABLE CHEST 1 VIEW COMPARISON:  December 01, 2022. FINDINGS: Stable cardiomegaly. Left lung is clear. Minimal right basilar subsegmental atelectasis  is noted with small pleural effusion. Bony thorax is unremarkable. IMPRESSION: Minimal right basilar subsegmental atelectasis with small right pleural effusion. Electronically Signed   By: Lupita Raider M.D.   On: 12/31/2022 19:37   VAS [place] LOWER EXTREMITY VENOUS (DVT) (7a-7p)  Result Date: 12/31/2022  Lower Venous DVT Study Patient Name:  LEKENDRICK DIPPOLD  Date of Exam:   12/31/2022 Medical Rec #: [id number]      Accession #:    [id number] Date of Birth: November 29, 1945       Patient Gender: M Patient Age:   38 years Exam Location:  [facility] Procedure:      VAS [place] LOWER EXTREMITY VENOUS (DVT) Referring Phys: [facility] MEREDITH --------------------------------------------------------------------------------  Indications: Pain.  Limitations: Patient movement due to pain. Comparison Study: No prior exam. Performing Technologist: Fernande Bras  Examination Guidelines: A complete evaluation includes B-mode imaging, spectral Doppler, color Doppler, and power Doppler as needed of all accessible portions of each vessel. Bilateral testing is considered an integral part of a complete examination. Limited examinations for reoccurring indications may be performed as noted. The reflux portion of the exam is performed with the patient in reverse Trendelenburg.  +---------+---------------+---------+-----------+----------+----------------+ RIGHT    CompressibilityPhasicitySpontaneityPropertiesThrombus Aging   +---------+---------------+---------+-----------+----------+----------------+ CFV      Full           Yes      Yes                                   +---------+---------------+---------+-----------+----------+----------------+ SFJ      Full                                                          +---------+---------------+---------+-----------+----------+----------------+ FV Prox  Full                                                           +---------+---------------+---------+-----------+----------+----------------+  FV Mid   Full                                                          +---------+---------------+---------+-----------+----------+----------------+ FV DistalFull                                                          +---------+---------------+---------+-----------+----------+----------------+ PFV      Full                                                          +---------+---------------+---------+-----------+----------+----------------+ POP      Full           Yes      Yes                                   +---------+---------------+---------+-----------+----------+----------------+ PTV      Full                                                          +---------+---------------+---------+-----------+----------+----------------+ PERO                                                  Patent by color. +---------+---------------+---------+-----------+----------+----------------+   +----+---------------+---------+-----------+----------+--------------+ LEFTCompressibilityPhasicitySpontaneityPropertiesThrombus Aging +----+---------------+---------+-----------+----------+--------------+ CFV Full           Yes      Yes                                 +----+---------------+---------+-----------+----------+--------------+    *See table(s) above for measurements and observations.    Preliminary     Pending Labs Unresulted Labs (From admission, onward)     Start     Ordered   01/01/23 0500  Basic metabolic panel  Tomorrow morning,   R        12/31/22 2115   12/31/22 1746  Urinalysis, w/ Reflex to Culture (Infection Suspected) -Urine, Clean Catch  (Undifferentiated presentation (screening labs and basic nursing orders))  ONCE - URGENT,   URGENT       Question:  Specimen Source  Answer:  Urine, Clean Catch   12/31/22 1745   12/31/22 1745  Blood Culture (routine x 2)   (Undifferentiated presentation (screening labs and basic nursing orders))  BLOOD CULTURE X 2,   STAT      12/31/22 1745            Vitals/Pain Today's Vitals   12/31/22 1815 12/31/22 1823 12/31/22 2145 12/31/22 2321  BP: (!) 108/96  117/84  Pulse: (!) 115  (!) 104   Resp: 20     Temp:  99.2 F (37.3 C)  (!) 97.5 F (36.4 C)  TempSrc:  Oral  Oral  SpO2: 95%  95%   Weight:      Height:      PainSc:        Isolation Precautions No active isolations  Medications Medications  heparin injection 5,000 Units (has no administration in time range)  potassium chloride (KLOR-CON) packet 20 mEq (has no administration in time range)    Mobility walks     Focused Assessments Cardiac Assessment Handoff:    Lab Results  Component Value Date   TROPONINI <0.03 11/12/2014   No results found for: "DDIMER" Does the Patient currently have chest pain? No    R Recommendations: See Admitting Provider Note  Report given to:   Additional Notes:

## 2023-01-01 NOTE — Plan of Care (Signed)

## 2023-01-01 NOTE — Evaluation (Signed)
Physical Therapy Evaluation Patient Details Name: KOLBIE ALLENBAUGH MRN: [id number] DOB: 09-Apr-1945 Today's Date: 01/01/2023  History of Present Illness  Mr. Silverio Tyrell is a 77 year old male who initially  presented to the emergency department for right lower extremity pain, then showing AMS; per Medical Team, Suspect AMS likely toxic metabolic in nature, (centrally acting medication with decreased renal function).  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 Lives at home with spouse (great family support), in a single-level home with a ramped entrance; Prior to admission, pt was able to overall manage mobility and amb independently with RW; had been receiving HHPT, and pt was able ot wean O2 needs form 4L to 1.5 L since prev admission; Presents to PT with R knee pain, incr distractibility due to pain, overall restless generalized movements with difficulty calming, keeping the body still; Min assist to get up to EOB, to stand, and to take sidesteps at EOB; ultimately unsafe to get to recliner, so opted to sit back to bed, and place bed in chair position for lunch;  Pt currently with functional limitations due to the deficits listed below (see PT Problem List). Pt will benefit from skilled PT to increase their independence and safety with mobility to allow discharge to the venue listed below.           If plan is discharge home, recommend the following: A little help with walking and/or transfers;A little help with bathing/dressing/bathroom;Assistance with cooking/housework;Help with stairs or ramp for entrance   Can travel by private vehicle        Equipment Recommendations None recommended by PT  Recommendations for Other Services       Functional Status Assessment Patient has had a recent decline in their functional status and  demonstrates the ability to make significant improvements in function in a reasonable and predictable amount of time.     Precautions / Restrictions Precautions Precautions: Fall Restrictions Weight Bearing Restrictions: No      Mobility  Bed Mobility Overal bed mobility: Needs Assistance Bed Mobility: Supine to Sit, Sit to Supine     Supine to sit: Min assist Sit to supine: Min assist   General bed mobility comments: Min handheld assist to pull to sit; assist to help both LEs back into bed    Transfers Overall transfer level: Needs assistance Equipment used: Rolling walker (2 wheels) Transfers: Sit to/from Stand, Bed to chair/wheelchair/BSC Sit to Stand: Min assist   Step pivot transfers: Contact guard assist       General transfer comment: cues for hand placement and safety; simulated step pivot transfer, taking sidesteps towards [facility] with RW    Ambulation/Gait               General Gait Details: Deferred; a lot of pain and restless/jerking movements  Stairs            Wheelchair Mobility     Tilt Bed    Modified Rankin (Stroke Patients Only)       Balance Overall balance assessment: Needs assistance   Sitting balance-Leahy Scale: Fair       Standing balance-Leahy Scale: Poor                               Pertinent Vitals/Pain Pain Assessment Pain Assessment:  0-10 Pain Score: 10-Worst pain ever Pain Location: Posterior R knee; very sensitive to touch Pain Descriptors / Indicators: Aching, Constant, Contraction, Cramping, Grimacing, Guarding, Discomfort Pain Intervention(s): Monitored during session, Limited activity within patient's tolerance    Home Living Family/patient expects to be discharged to:: Private residence Living Arrangements: Spouse/significant other Available Help at Discharge: Family;Available 24 hours/day Type of Home: House Home Access: Ramped entrance       Home Layout: One level Home Equipment:  Agricultural consultant (2 wheels);Cane - single point;Grab bars - toilet;Shower seat Additional Comments: Typically on 1.5 to 2 liters supplemental O2 at home    Prior Function Prior Level of Function : Independent/Modified Independent;Driving             Mobility Comments: Household distances without AD, chronic back pain ADLs Comments: Spouse assists with iADL and yard work.  No assist with ADL     Extremity/Trunk Assessment   Upper Extremity Assessment Upper Extremity Assessment: Overall WFL for tasks assessed (for simple mobiltiy tasks)    Lower Extremity Assessment Lower Extremity Assessment: RLE deficits/detail;Difficult to assess due to impaired cognition RLE Deficits / Details: Pain in back of R knee; no noteworthy edema, erythem, or heat to touch; more painful with knee in full extension, but ROM R knee full range; difficulty/sensitivity with palpation; noted cramping sensation in upper calf when sitting EOB; Difficulty looking and feeling specifically at hamstring insertion and gastrocnemius superior attachments because of pt's tendency to move/writhe in the bed RLE: Unable to fully assess due to pain    Cervical / Trunk Assessment Cervical / Trunk Assessment: Normal;Other exceptions Cervical / Trunk Exceptions: with notable scar from previous surgery; pt reports chronic back pain  Communication   Communication Communication: No apparent difficulties Cueing Techniques: Verbal cues;Tactile cues  Cognition Arousal: Alert Behavior During Therapy: WFL for tasks assessed/performed, Restless Overall Cognitive Status: Impaired/Different from baseline Area of Impairment: Orientation, Attention, Following commands, Safety/judgement, Awareness                 Orientation Level: Disoriented to, Time, Situation Current Attention Level: Sustained   Following Commands: Follows one step commands with increased time Safety/Judgement: Decreased awareness of safety, Decreased  awareness of deficits     General Comments: Very restless with pain and internally distracted        General Comments General comments (skin integrity, edema, etc.): Stands and accepts weight onto RLE, but it is painful    Exercises     Assessment/Plan    PT Assessment Patient needs continued PT services  PT Problem List Decreased strength;Decreased activity tolerance;Decreased balance;Decreased mobility;Decreased coordination;Decreased cognition;Decreased knowledge of use of DME;Decreased safety awareness;Decreased knowledge of precautions;Pain       PT Treatment Interventions DME instruction;Gait training;Stair training;Functional mobility training;Therapeutic activities;Therapeutic exercise;Balance training;Neuromuscular re-education;Cognitive remediation;Patient/family education;Wheelchair mobility training    PT Goals (Current goals can be found in the Care Plan section)  Acute Rehab PT Goals Patient Stated Goal: less pain PT Goal Formulation: With patient/family Time For Goal Achievement: 01/15/23 Potential to Achieve Goals: Good    Frequency Min 1X/week     Co-evaluation               AM-PAC PT "6 Clicks" Mobility  Outcome Measure Help needed turning from your back to your side while in a flat bed without using bedrails?: None Help needed moving from lying on your back to sitting on the side of a flat bed without using bedrails?: A Little Help needed moving to and from  a bed to a chair (including a wheelchair)?: A Little Help needed standing up from a chair using your arms (e.g., wheelchair or bedside chair)?: A Little Help needed to walk in hospital room?: A Lot Help needed climbing 3-5 steps with a railing? : Total 6 Click Score: 16    End of Session Equipment Utilized During Treatment: Gait belt Activity Tolerance: Patient tolerated treatment well Patient left: in bed;with call bell/phone within reach;with bed alarm set;with family/visitor present;Other  (comment) (bed in chair position for eating lunch) Nurse Communication: Mobility status;Other (comment) (can consider heat or ice) PT Visit Diagnosis: Unsteadiness on feet (R26.81);Other abnormalities of gait and mobility (R26.89);Pain Pain - Right/Left: Right Pain - part of body: Knee    Time: 1124-1200 PT Time Calculation (min) (ACUTE ONLY): 36 min   Charges:   PT Evaluation $PT Eval Moderate Complexity: 1 Mod PT Treatments $Therapeutic Activity: 8-22 mins PT General Charges $$ ACUTE PT VISIT: 1 Visit         Van Clines, PT  Acute Rehabilitation Services Office [phone] Secure Chat welcomed   Levi Aland 01/01/2023, 2:16 PM

## 2023-01-02 ENCOUNTER — Other Ambulatory Visit (HOSPITAL_COMMUNITY): Payer: Self-pay

## 2023-01-02 DIAGNOSIS — J9611 Chronic respiratory failure with hypoxia: Secondary | ICD-10-CM

## 2023-01-02 DIAGNOSIS — S86911A Strain of unspecified muscle(s) and tendon(s) at lower leg level, right leg, initial encounter: Secondary | ICD-10-CM | POA: Diagnosis not present

## 2023-01-02 DIAGNOSIS — N184 Chronic kidney disease, stage 4 (severe): Secondary | ICD-10-CM | POA: Diagnosis not present

## 2023-01-02 DIAGNOSIS — R410 Disorientation, unspecified: Secondary | ICD-10-CM | POA: Diagnosis not present

## 2023-01-02 LAB — CBC
HCT: 33.2 % — ABNORMAL LOW (ref 39.0–52.0)
Hemoglobin: 10.5 g/dL — ABNORMAL LOW (ref 13.0–17.0)
MCH: 29.2 pg (ref 26.0–34.0)
MCHC: 31.6 g/dL (ref 30.0–36.0)
MCV: 92.2 fL (ref 80.0–100.0)
Platelets: 229 10*3/uL (ref 150–400)
RBC: 3.6 MIL/uL — ABNORMAL LOW (ref 4.22–5.81)
RDW: 13.9 % (ref 11.5–15.5)
WBC: 13.9 10*3/uL — ABNORMAL HIGH (ref 4.0–10.5)
nRBC: 0 % (ref 0.0–0.2)

## 2023-01-02 LAB — BASIC METABOLIC PANEL
Anion gap: 13 (ref 5–15)
BUN: 47 mg/dL — ABNORMAL HIGH (ref 8–23)
CO2: 27 mmol/L (ref 22–32)
Calcium: 8.9 mg/dL (ref 8.9–10.3)
Chloride: 99 mmol/L (ref 98–111)
Creatinine, Ser: 4.04 mg/dL — ABNORMAL HIGH (ref 0.61–1.24)
GFR, Estimated: 14 mL/min — ABNORMAL LOW (ref >60)
Glucose, Bld: 155 mg/dL — ABNORMAL HIGH (ref 70–99)
Potassium: 3.4 mmol/L — ABNORMAL LOW (ref 3.5–5.1)
Sodium: 139 mmol/L (ref 135–145)

## 2023-01-02 LAB — GLUCOSE, CAPILLARY: Glucose-Capillary: 142 mg/dL — ABNORMAL HIGH (ref 70–99)

## 2023-01-02 LAB — MAGNESIUM: Magnesium: 2.2 mg/dL (ref 1.7–2.4)

## 2023-01-02 MED ORDER — HYDROXYZINE HCL 25 MG PO TABS
25.0000 mg | ORAL_TABLET | Freq: Three times a day (TID) | ORAL | 0 refills | Status: AC | PRN
  Filled 2023-01-02: qty 9, 3d supply, fill #0

## 2023-01-02 MED ORDER — POTASSIUM CHLORIDE CRYS ER 20 MEQ PO TBCR
20.0000 meq | EXTENDED_RELEASE_TABLET | Freq: Once | ORAL | Status: AC
  Administered 2023-01-02: 20 meq via ORAL
  Filled 2023-01-02: qty 1

## 2023-01-02 MED ORDER — GABAPENTIN 300 MG PO CAPS: 300.0000 mg | ORAL_CAPSULE | Freq: Every day | ORAL | Status: DC

## 2023-01-02 MED ORDER — GABAPENTIN 100 MG PO CAPS: 100.0000 mg | ORAL_CAPSULE | Freq: Every day | ORAL | Status: DC

## 2023-01-02 MED ORDER — PHENOL 1.4 % MT LIQD
1.0000 | OROMUCOSAL | Status: DC | PRN
  Administered 2023-01-02: 1 via OROMUCOSAL
  Filled 2023-01-02: qty 177

## 2023-01-02 NOTE — Discharge Instructions (Addendum)
FOLLOW-UP INSTRUCTIONS:  Thank you for allowing [place] to be part of your care. You were hospitalized for Altered mental status.    Please follow up with the following providers: A. [name], MD, [address] / [place] [address], [phone]  B.  Home health with physical therapy, and Occupational Therapy.  Please note these changes made to your medications:   A. Medications to continue: Current Meds  Medication Sig   acetaminophen (TYLENOL) 500 MG tablet Take 1,000 mg by mouth every 6 (six) hours as needed (for pain.).   aspirin EC 81 MG tablet Take 81 mg by mouth daily. Swallow whole.   atorvastatin (LIPITOR) 40 MG tablet Take 40 mg by mouth at bedtime.   buPROPion (WELLBUTRIN XL) 150 MG 24 hr tablet Take 150 mg by mouth every morning.   busPIRone (BUSPAR) 10 MG tablet Take 10 mg by mouth in the morning.   Dulaglutide (TRULICITY) 1.5 MG/0.5ML SOAJ Inject 1.5 mg into the skin daily. Takes on Thursdays   gabapentin (NEURONTIN) 100 MG capsule Take 100 mg by mouth 3 (three) times daily.   HYDROcodone-acetaminophen (NORCO/VICODIN) 5-325 MG tablet Take 1 tablet by mouth daily.   insulin glargine (LANTUS) 100 UNIT/ML injection Inject 40 Units into the skin in the morning.   midodrine (PROAMATINE) 5 MG tablet Take 1 tablet (5 mg total) by mouth 2 (two) times daily with a meal.   sertraline (ZOLOFT) 100 MG tablet Take 100 mg by mouth in the morning.      B. Medications to start: -Hydroxyzine 25 mg every 8 hours as needed for anxiety. -Decreased gabapentin dose to 100 mg at nighttime.  C. Medications to discontinue:  None.   Please make sure to return to the hospital if you have worsening right leg swelling and pain, and or worsening confusion.    Please call our clinic if you have any questions or concerns, we may be able to help and keep you from a long and expensive emergency room wait. Our clinic and after hours phone number is [phone], the best time to  call is Monday through Friday 9 am to 4 pm but there is always someone available 24/7 if you have an emergency. If you need medication refills please notify your pharmacy one week in advance and they will send [place] a request.

## 2023-01-02 NOTE — Discharge Summary (Addendum)
Name: Jeffery Fernandez MRN: [id number] DOB: March 08, 1945 77 y.o. PCP: [name], MD  Date of Admission: 12/31/2022  2:06 PM Date of Discharge: 01/02/2023 Attending Physician: Dr. Cleda Daub  Discharge Diagnosis: Principal Problem:   Altered mental status Active Problems:   Type 2 diabetes mellitus, with long-term current use of insulin (HCC)   CKD (chronic kidney disease) stage 4, GFR 15-29 ml/min (HCC)   AMS (altered mental status)   Muscle strain of knee, right, initial encounter   Chronic hypoxemic respiratory failure [facility])    Discharge Medications: Allergies as of 01/02/2023       Reactions   Amlodipine Swelling   Patient report facial swelling at high doses   Penicillins Other (See Comments)   Possibly rash - not sure Has patient had a PCN reaction causing immediate rash, facial/tongue/throat swelling, SOB or lightheadedness with hypotension:unknown Has patient had a PCN reaction causing severe rash involving mucus membranes or skin necrosis: Unknown Has patient had a PCN reaction that required hospitalization: Unknown Has patient had a PCN reaction occurring within the last 10 years: No Childhood reaction If all of the above answers are "NO", then may proceed with Cephalosporin use.   Hydralazine Other (See Comments)   Dizziness        Medication List     STOP taking these medications    HYDROcodone-acetaminophen 5-325 MG tablet Commonly known as: NORCO/VICODIN       TAKE these medications    acetaminophen 500 MG tablet Commonly known as: TYLENOL Take 1,000 mg by mouth every 6 (six) hours as needed (for pain.).   aspirin EC 81 MG tablet Take 81 mg by mouth daily. Swallow whole.   atorvastatin 40 MG tablet Commonly known as: LIPITOR Take 40 mg by mouth at bedtime.   buPROPion 150 MG 24 hr tablet Commonly known as: WELLBUTRIN XL Take 150 mg by mouth every morning.   busPIRone 10 MG tablet Commonly known as: BUSPAR Take 10 mg by mouth in the  morning.   gabapentin 100 MG capsule Commonly known as: NEURONTIN Take 1 capsule (100 mg total) by mouth daily. What changed: when to take this   hydrOXYzine 25 MG tablet Commonly known as: ATARAX Take 1 tablet (25 mg total) by mouth 3 (three) times daily as needed for up to 3 days for anxiety.   insulin glargine 100 UNIT/ML injection Commonly known as: LANTUS Inject 40 Units into the skin in the morning.   midodrine 5 MG tablet Commonly known as: PROAMATINE Take 1 tablet (5 mg total) by mouth 2 (two) times daily with a meal.   QC Tumeric Complex 500 MG Caps Generic drug: Turmeric Take 2,500 mg by mouth in the morning and at bedtime.   sertraline 100 MG tablet Commonly known as: ZOLOFT Take 100 mg by mouth in the morning.   torsemide 20 MG tablet Commonly known as: DEMADEX Take 2 tablets (40 mg total) by mouth daily. What changed: when to take this   Trulicity 1.5 MG/0.5ML Soaj Generic drug: Dulaglutide Inject 1.5 mg into the skin daily. Takes on Thursdays               Durable Medical Equipment  (From admission, onward)           Start     Ordered   01/02/23 1129  For home use only DME 4 wheeled rolling walker with seat  Once       Question:  Patient needs a walker to treat with  the following condition  Answer:  Physical deconditioning   01/02/23 1129            Disposition and follow-up:   [name] was discharged from [facility] in [place] condition.  At the hospital follow up visit please address:  1.  Follow-up:  # Altered mental status. Possibly due to toxic metabolic encephalopathy.  We held all medicines on presentation, which improve his mentation.  We restarted Zoloft, buspirone, and decreased the dose of gabapentin to 100 mg once a day. -Holding hydrocodone, due to decreased clearance in the setting of his end-stage kidney disease.    # Right leg pain Posterior right leg pain without edema or erythema.  Normal  DVT.  Imaging with x-ray of the pelvis and the hip were both negative for an acute fracture.  Right posterior pain is likely secondary to a pulled muscle.  -Tylenol and lidocaine patch for pain. -Prescribed a 3-day supply of hydroxyzine 25 mg.  He was educated that hydroxyzine can lead to urinary retention.  retention. -Home health with physical therapy and Occupational Therapy.  # Leukocytosis WBC elevated to 15K on presentation, no clear source of infection.  Not endorsing cough or urinary symptoms.  On day of discharge, WBC has down trended to 13.9 K.  Elevated WBC possibly due to primary lung malignancy. -Can consider further workup if deemed necessary.  # Hypokalemia Prior to discharge, K3.4, repleted with 20 mEq of p.o. potassium.  Follow-up mag was ordered, but lab was not collected.  -Follow-up K, and Mg.  2.  Labs / imaging needed at time of follow-up: CBC and BMP, Mg.  3.  Pending labs/ test needing follow-up: None  4.  Medication Changes: Decrease gabapentin to 100 mg once a day. Abx -   End Date:  Follow-up Appointments:  Follow-up Information     [facility] Follow [facility].   Specialty: Home Health Services Why: Home health has been arranged. They will contact you to schedule apt within 48hrs post discharge. Contact information: [address] [place] [address] [phone]         [name], MD Follow up in 1 week(s).   Specialty: Family Medicine Why: Hospital follow up Contact information: [address] [place] [address] [phone]                 Hospital Course by problem list: Jeffery Fernandez is a 77 y.o. person living with a history of suspected primary lung malignancy, HTN, T2DM who presented with right leg pain and admitted for altered mental status.   #Altered Mental Status #Toxic Metabolic encephalopathy due to centrally acting medications in setting of decreased renal function  On  presentation, he was noted to be lethargic, and confused.  WBC elevated to 15K.  No clear source of infection, possibly related to the primary lung cancer. CT head did not show any brain mets givne patient's recent cancer diagnosis with metastasis.  Altered mental status was thought to be possibly secondary to toxic metabolites from central acting home meds in the setting of reduced renal function.  We held those medicines which improves his mentation.  Was found to be hyperactive, so restarted Zoloft, and decreased gabapentin to 100 mg daily.  On the day of discharge, patient was alert and oriented x 3.    Plan:  -Continue Zoloft 100 mg. -Reduce gabapentin dose to 100 mg -Continue Wellbutrin -Holding Norco.    #Right  Leg Pain On presentation, he had an acute onset of right lower extremity pain, worse in the posterior knee.  No effusion or erythema on exam.  Normal DVT studies.  Right leg pain possibly due to a pulled muscle.  We treated his pain with lidocaine patch, Tylenol and physical therapy and Occupational Therapy.  Patient will benefit from home health per PT/OT recommendations.  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    #HFpEF Echo in 10/28 showed an ejection fraction of 55-60%. Does not appear volume overloaded on exam.    #CKD Stage 4 Current creatinine at 3.32, and GFR at 18, seems consistent with baseline.    #Type 2 DM Last A1c 6.2, home meds include Trulicity 1.5mg  weekly, and Lantus 40U. Will manage with sliding scale for now until patient is more awake and eating more.    #Hypokalemia  Repleted, stable on the day of discharge.   #Normocytic Anemia  Hb at 10.5, relatively consistent with previous values. Could be related to kidney disease,  however last iron studies I can see are from 2021. Consider outpatient workup.      Discharge Subjective: Patient evaluated at bedside this AM.  Denies cough or urinary symptoms. Right leg pain has improved.  Work with PT/OT, recommending home health.  Discharge Exam:   BP (!) 148/83   Pulse (!) 102   Temp 98 F (36.7 C)   Resp 18   Ht 5\' 9"  (1.753 m)   Wt 84.7 kg   SpO2 96%   BMI 27.58 kg/m   General: Well-appearing, not in acute distress. CV: RRR. No m/r/g. No LE edema Pulmonary: Lungs CTAB. Normal effort. No wheezing or rales. Abdominal: Soft, nontender, nondistended. Normal bowel sounds. Extremities: Palpable dorsalis pedis and radialis pulses. Normal ROM. MSK: Right posterior knee tender to palpation.  No edema or erythema. Neuro: A&Ox3. Moves all extremities. Normal sensation. No focal deficit.   Pertinent Labs, Studies, and Procedures:     Latest Ref Rng & Units 01/02/2023    7:44 AM 01/01/2023   12:42 AM 12/31/2022   11:38 PM  CBC  WBC 4.0 - 10.5 K/uL 13.9  15.4  15.0   Hemoglobin 13.0 - 17.0 g/dL 16.1  09.6  04.5   Hematocrit 39.0 - 52.0 % 33.2  32.8  31.8   Platelets 150 - 400 K/uL 229  211  209        Latest Ref Rng & Units 01/02/2023    7:44 AM 01/01/2023   12:42 AM 12/31/2022   11:38 PM  CMP  Glucose 70 - 99 mg/dL 409  811    BUN 8 - 23 mg/dL 47  33    Creatinine 9.14 - 1.24 mg/dL 7.82  9.56  2.13   Sodium 135 - 145 mmol/L 139  142    Potassium 3.5 - 5.1 mmol/L 3.4  3.4    Chloride 98 - 111 mmol/L 99  103    CO2 22 - 32 mmol/L 27  27    Calcium 8.9 - 10.3 mg/dL 8.9  8.8      VAS [place] LOWER EXTREMITY VENOUS (DVT) (7a-7p)  Result Date: 01/01/2023  Lower Venous DVT Study Patient Name:  GOBEL ASLIN  Date of Exam:   12/31/2022 Medical Rec #:  [id number]      Accession #:    [id number] Date of Birth: 03-29-1945       Patient Gender: M Patient Age:   76 years Exam Location:  [facility] Procedure:      VAS [place] LOWER EXTREMITY VENOUS (DVT) Referring Phys:  [facility] MEREDITH --------------------------------------------------------------------------------  Indications: Pain.  Limitations: Patient movement due to pain. Comparison Study: No prior exam. Performing Technologist: Fernande Bras  Examination Guidelines: A complete evaluation includes B-mode imaging, spectral Doppler, color Doppler, and power Doppler as needed of all accessible portions of each vessel. Bilateral testing is considered an integral part of a complete examination. Limited examinations for reoccurring indications may be performed as noted. The reflux portion of the exam is performed with the patient in reverse Trendelenburg.  +---------+---------------+---------+-----------+----------+----------------+ RIGHT    CompressibilityPhasicitySpontaneityPropertiesThrombus Aging   +---------+---------------+---------+-----------+----------+----------------+ CFV      Full           Yes      Yes                                   +---------+---------------+---------+-----------+----------+----------------+ SFJ      Full                                                          +---------+---------------+---------+-----------+----------+----------------+ FV Prox  Full                                                          +---------+---------------+---------+-----------+----------+----------------+ FV Mid   Full                                                          +---------+---------------+---------+-----------+----------+----------------+ FV DistalFull                                                          +---------+---------------+---------+-----------+----------+----------------+ PFV      Full                                                          +---------+---------------+---------+-----------+----------+----------------+ POP      Full           Yes      Yes                                    +---------+---------------+---------+-----------+----------+----------------+ PTV      Full                                                          +---------+---------------+---------+-----------+----------+----------------+  PERO                                                  Patent by color. +---------+---------------+---------+-----------+----------+----------------+   +----+---------------+---------+-----------+----------+--------------+ LEFTCompressibilityPhasicitySpontaneityPropertiesThrombus Aging +----+---------------+---------+-----------+----------+--------------+ CFV Full           Yes      Yes                                 +----+---------------+---------+-----------+----------+--------------+     Summary: RIGHT: - There is no evidence of deep vein thrombosis in the lower extremity.  - No cystic structure found in the popliteal fossa.  LEFT: - No evidence of common femoral vein obstruction.   *See table(s) above for measurements and observations. Electronically signed by Coral Else MD on 01/01/2023 at 7:13:05 PM.    Final    DG Abd 1 View  Result Date: 01/01/2023 CLINICAL DATA:  Abdominal pain. EXAM: ABDOMEN - 1 VIEW COMPARISON:  PET-CT, 12/16/2022. FINDINGS: Normal bowel gas pattern. 1.1 cm calculus projects in the right kidney, stable from the prior PET-CT. No left intrarenal stone or evidence of a ureteral stone. Elevated right hemidiaphragm, also stable. No acute skeletal abnormality. Previous posterior lumbar spine fusion at L4-L5 and previous vertebroplasty at L3, L4 and L5. IMPRESSION: 1. No acute findings. 2. Right intrarenal calculus. Electronically Signed   By: Amie Portland M.D.   On: 01/01/2023 10:29   DG Knee 1-2 Views Right  Result Date: 12/31/2022 CLINICAL DATA:  Right hip pain and right leg pain worse since yesterday. Difficulty walking. EXAM: RIGHT KNEE - 1-2 VIEW COMPARISON:  None Available. FINDINGS: Mild degenerative changes in the right knee  with mild medial compartment narrowing. No evidence of acute fracture or dislocation. No focal bone lesion or bone destruction. No significant effusions. Vascular calcifications in the soft tissues. IMPRESSION: Mild degenerative changes in the right knee with medial compartment narrowing. No acute bony abnormalities. Electronically Signed   By: Burman Nieves M.D.   On: 12/31/2022 22:31   DG HIP UNILAT WITH PELVIS 1V RIGHT  Result Date: 12/31/2022 CLINICAL DATA:  Right hip pain with difficulty walking. History of lung cancer. EXAM: DG HIP (WITH OR WITHOUT PELVIS) 1V RIGHT COMPARISON:  PET-CT 12/16/2022 FINDINGS: Mild degenerative changes in the right hip with narrowing and sclerosis of the superior acetabulum and small osteophyte formation. The hip is rotated, limiting evaluation of the femoral neck but no acute displaced fractures are identified as visualized. No focal bone lesion or bone destruction. Pelvis and left hip appear intact. SI joints and symphysis pubis are not displaced. Postoperative changes in the lower lumbar spine. Vascular calcifications. IMPRESSION: Mild degenerative changes in the right hip. No acute displaced fractures are identified. Electronically Signed   By: Burman Nieves M.D.   On: 12/31/2022 22:30   CT Head Wo Contrast  Result Date: 12/31/2022 CLINICAL DATA:  Mental status change EXAM: CT HEAD WITHOUT CONTRAST TECHNIQUE: Contiguous axial images were obtained from the base of the skull through the vertex without intravenous contrast. RADIATION DOSE REDUCTION: This exam was performed according to the departmental dose-optimization program which includes automated exposure control, adjustment of the mA and/or kV according to patient size and/or use of iterative reconstruction technique. COMPARISON:  05/13/2020 CT head FINDINGS: Brain: No evidence of acute infarction, hemorrhage,  mass, mass effect, or midline shift. No hydrocephalus or extra-axial fluid collection.  Periventricular white matter changes, likely the sequela of chronic small vessel ischemic disease. Mildly advanced cerebral atrophy for age. Basal ganglia calcifications. Vascular: No hyperdense vessel. Atherosclerotic calcifications in the intracranial carotid and vertebral arteries. Skull: Negative for fracture or focal lesion. Sinuses/Orbits: No acute finding. Other: The mastoid air cells are well aerated. IMPRESSION: No acute intracranial process. Electronically Signed   By: Wiliam Ke M.D.   On: 12/31/2022 19:42   DG Chest Portable 1 View  Result Date: 12/31/2022 CLINICAL DATA:  Altered mental status, shortness of breath. EXAM: PORTABLE CHEST 1 VIEW COMPARISON:  December 01, 2022. FINDINGS: Stable cardiomegaly. Left lung is clear. Minimal right basilar subsegmental atelectasis is noted with small pleural effusion. Bony thorax is unremarkable. IMPRESSION: Minimal right basilar subsegmental atelectasis with small right pleural effusion. Electronically Signed   By: Lupita Raider M.D.   On: 12/31/2022 19:37     Discharge Instructions:   Signed: Laretta Bolster, MD 01/02/2023, 2:23 PM   Pager: [phone]

## 2023-01-02 NOTE — Plan of Care (Signed)
Patient alert/oriented X4. Patient compliant with medication administration and ambulated around room with assist on 2L nasal cannula. Home oxygen delivered to room and patient will be transported out in wheelchair on home 2L oxygen through nasal cannula. Patient PIV's removed prior to discharge and AVS discharge instructions explained in detail. Medications delivered to patient from [facility] pharmacy and personal belongings are packed up at bedside. VSS, no complaints at this time.   Problem: Education: Goal: Ability to describe self-care measures that may prevent or decrease complications (Diabetes Survival Skills Education) will improve Outcome: Adequate for Discharge   Problem: Education: Goal: Individualized Educational Video(s) Outcome: Adequate for Discharge   Problem: Coping: Goal: Ability to adjust to condition or change in health will improve Outcome: Adequate for Discharge   Problem: Fluid Volume: Goal: Ability to maintain a balanced intake and output will improve Outcome: Adequate for Discharge   Problem: Health Behavior/Discharge Planning: Goal: Ability to identify and utilize available resources and services will improve Outcome: Adequate for Discharge   Problem: Health Behavior/Discharge Planning: Goal: Ability to manage health-related needs will improve Outcome: Adequate for Discharge   Problem: Metabolic: Goal: Ability to maintain appropriate glucose levels will improve Outcome: Adequate for Discharge   Problem: Nutritional: Goal: Maintenance of adequate nutrition will improve Outcome: Adequate for Discharge   Problem: Nutritional: Goal: Progress toward achieving an optimal weight will improve Outcome: Adequate for Discharge   Problem: Skin Integrity: Goal: Risk for impaired skin integrity will decrease Outcome: Adequate for Discharge   Problem: Tissue Perfusion: Goal: Adequacy of tissue perfusion will improve Outcome: Adequate for Discharge

## 2023-01-02 NOTE — Evaluation (Signed)
Occupational Therapy Evaluation Patient Details Name: Jeffery Fernandez MRN: [id number] DOB: 05-15-1945 Today's Date: 01/02/2023   History of Present Illness Jeffery Fernandez is a 77 year old male who initially  presented to the emergency department for right lower extremity pain, then showing AMS; per Medical Team, Suspect AMS likely toxic metabolic in nature, (centrally acting medication with decreased renal function).  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 Pt was limited by his onset of back pain but denies any R knee pain during session. PTA pt lived with spouse who could assist him prn although he was independent before, Pt currently needing min A to setup assist for ADLs. Pt would benefit from continued acute skilled OT services to address deficits and help transition to next level of care. Patient would benefit from post acute Home OT services to help maximize functional independence in natural environment        If plan is discharge home, recommend the following: A little help with bathing/dressing/bathroom;Assistance with cooking/housework    Functional Status Assessment  Patient has had a recent decline in their functional status and demonstrates the ability to make significant improvements in function in a reasonable and predictable amount of time.  Equipment Recommendations  None recommended by OT (pt has rec DME)    Recommendations for Other Services       Precautions / Restrictions Precautions Precautions: Fall Restrictions Weight Bearing Restrictions: No      Mobility Bed Mobility Overal bed mobility: Needs Assistance Bed Mobility: Supine to Sit     Supine to sit: Min assist, Used rails, HOB elevated     General bed  mobility comments: Min A to raise trunk, pt continued to hold bed rails once upright, cues for pt to relax body and hold good posture    Transfers Overall transfer level: Needs assistance Equipment used: Rolling walker (2 wheels) Transfers: Sit to/from Stand Sit to Stand: Contact guard assist           General transfer comment: increased time needed, cues for hand placement. CGA STS from toilet (BSC over it)      Balance Overall balance assessment: Needs assistance   Sitting balance-Leahy Scale: Fair       Standing balance-Leahy Scale: Fair Standing balance comment: Static standing no AD                           ADL either performed or assessed with clinical judgement   ADL Overall ADL's : Needs assistance/impaired Eating/Feeding: Independent;Sitting   Grooming: Standing;Contact guard assist   Upper Body Bathing: Sitting;Set up;Supervision/ safety   Lower Body Bathing: Supervison/ safety;Set up;Sitting/lateral leans   Upper Body Dressing : Supervision/safety;Set up;Sitting   Lower Body Dressing: Minimal assistance;Sit to/from stand   Toilet Transfer: Contact guard assist;Rolling walker (2 wheels);Ambulation   Toileting- Clothing Manipulation and Hygiene: Contact guard assist;Sit to/from stand       Functional mobility during ADLs: Contact guard assist;Rolling walker (2 wheels)       Vision         Perception         Praxis         Pertinent Vitals/Pain Pain Assessment Pain Assessment: Faces  Faces Pain Scale: Hurts little more Pain Location: back, when ambulating Pain Descriptors / Indicators: Aching, Discomfort Pain Intervention(s): Limited activity within patient's tolerance, Monitored during session     Extremity/Trunk Assessment Upper Extremity Assessment Upper Extremity Assessment: Overall WFL for tasks assessed           Communication Communication Communication: No apparent difficulties Cueing Techniques: Verbal  cues;Tactile cues   Cognition Arousal: Alert Behavior During Therapy: WFL for tasks assessed/performed Overall Cognitive Status: Impaired/Different from baseline Area of Impairment: Following commands, Safety/judgement                       Following Commands: Follows one step commands with increased time Safety/Judgement: Decreased awareness of safety, Decreased awareness of deficits     General Comments: Pt HOH as well, did not answer orientation questions when prompted a couple of times while on the toilet     General Comments  VSS on 1L    Exercises     Shoulder Instructions      Home Living Family/patient expects to be discharged to:: Private residence Living Arrangements: Spouse/significant other Available Help at Discharge: Family;Available 24 hours/day Type of Home: House Home Access: Ramped entrance     Home Layout: One level     Bathroom Shower/Tub: Walk-in shower;Tub/shower unit (mainly uses his tub shower)   Bathroom Toilet: Handicapped height     Home Equipment: Agricultural consultant (2 wheels);Cane - single point;Grab bars - toilet;Shower seat;Grab bars - tub/shower   Additional Comments: Typically on 1.5 to 2 liters supplemental O2 at home      Prior Functioning/Environment Prior Level of Function : Independent/Modified Independent;Driving             Mobility Comments: Household distances without AD, chronic back pain ADLs Comments: Spouse assists with iADL and yard work.  No assist with ADL        OT Problem List: Impaired balance (sitting and/or standing);Pain      OT Treatment/Interventions: Self-care/ADL training;Balance training;Therapeutic exercise;Therapeutic activities;Patient/family education    OT Goals(Current goals can be found in the care plan section) Acute Rehab OT Goals Patient Stated Goal: To go home OT Goal Formulation: With patient/family Time For Goal Achievement: 01/16/23 Potential to Achieve Goals: Good ADL  Goals Pt Will Perform Grooming: with supervision;standing Pt Will Perform Lower Body Bathing: with supervision;sit to/from stand Pt Will Perform Lower Body Dressing: with supervision;sit to/from stand Pt Will Transfer to Toilet: with supervision;ambulating Pt Will Perform Toileting - Clothing Manipulation and hygiene: with supervision;sitting/lateral leans  OT Frequency: Min 1X/week    Co-evaluation              AM-PAC OT "6 Clicks" Daily Activity     Outcome Measure Help from another person eating meals?: None Help from another person taking care of personal grooming?: A Little Help from another person toileting, which includes using toliet, bedpan, or urinal?: A Little Help from another person bathing (including washing, rinsing, drying)?: A Little Help from another person to put on and taking off regular upper body clothing?: A Little Help from another person to put on and taking off regular lower body clothing?: A Little 6 Click Score: 19   End of Session Equipment Utilized During Treatment: Gait belt;Rolling walker (2 wheels) Nurse Communication: Mobility status  Activity Tolerance: Patient tolerated treatment well Patient left: in chair;with call bell/phone within reach;with chair alarm set  OT Visit Diagnosis: Unsteadiness on feet (R26.81);Pain Pain - Right/Left:  (back)  Time: 4098-1191 OT Time Calculation (min): 46 min Charges:  OT General Charges $OT Visit: 1 Visit OT Evaluation $OT Eval Moderate Complexity: 1 Mod OT Treatments $Self Care/Home Management : 8-22 mins $Therapeutic Activity: 8-22 mins  01/02/2023  AB, OTR/L  Acute Rehabilitation Services  Office: [phone]   Tristan Schroeder 01/02/2023, 12:01 PM

## 2023-01-02 NOTE — Progress Notes (Signed)
Physical Therapy Treatment Patient Details Name: Jeffery Fernandez MRN: [id number] DOB: 11-29-1945 Today's Date: 01/02/2023   History of Present Illness Jeffery Fernandez is a 77 year old male who initially  presented to the emergency department for right lower extremity pain, then showing AMS; per Medical Team, Suspect AMS likely toxic metabolic in nature, (centrally acting medication with decreased renal function).  Suspect leg pain is muscle strain; with past medical history of high suspected primary lung cancer, type two diabetes, hypertension, and CKD3a    PT Comments  Patient is agreeable to PT session. Knee pain has improved per patient report. Increased activity tolerance compared to prior session. Patient ambulated in hallway with rolling walker with no loss of balance, CGA provided initially progressing to supervision with increased ambulation distance. Supportive family member at the bedside reporting patient has improved with mobility since yesterday and are hopeful for discharge home today. Recommend HHPT after this hospital stay.     If plan is discharge home, recommend the following: A little help with walking and/or transfers;A little help with bathing/dressing/bathroom;Assistance with cooking/housework;Help with stairs or ramp for entrance   Can travel by private vehicle        Equipment Recommendations  None recommended by PT    Recommendations for Other Services       Precautions / Restrictions Precautions Precautions: Fall Restrictions Weight Bearing Restrictions: No     Mobility  Bed Mobility               General bed mobility comments: not assessed as patient sitting up on arrival and post session    Transfers Overall transfer level: Needs assistance Equipment used: Rolling walker (2 wheels) Transfers: Sit to/from Stand Sit to Stand: Contact guard assist           General transfer comment: no physical lifting assistance required for standing. CGA  for safety. occasional safety cues for hand placement with sitting and standing    Ambulation/Gait Ambulation/Gait assistance: Contact guard assist, Supervision Gait Distance (Feet): 75 Feet Assistive device: Rolling walker (2 wheels) Gait Pattern/deviations: Step-through pattern, Decreased stride length Gait velocity: decreased     General Gait Details: patient ambulated into the hallway with rolling walker for support. CGA- supervision provided for safety. no rest breaks required. navigational cues needed in hallway. no jerking movements are noted today and no shortenss of breath with ambulation on 2 L02.   Stairs             Wheelchair Mobility     Tilt Bed    Modified Rankin (Stroke Patients Only)       Balance Overall balance assessment: Needs assistance Sitting-balance support: No upper extremity supported, Feet supported Sitting balance-Leahy Scale: Fair       Standing balance-Leahy Scale: Fair Standing balance comment: static standing with brief periods of no assistive device. recommend RW for dynamic activity                            Cognition Arousal: Alert Behavior During Therapy: WFL for tasks assessed/performed Overall Cognitive Status: Impaired/Different from baseline Area of Impairment: Following commands, Safety/judgement, Memory                     Memory: Decreased short-term memory Following Commands: Follows one step commands with increased time Safety/Judgement: Decreased awareness of safety, Decreased awareness of deficits  Exercises      General Comments General comments (skin integrity, edema, etc.): family member in the room repor patient's mobility has significantly improved since yesterday and they are hopeful for discharge home today.      Pertinent Vitals/Pain Pain Assessment Pain Assessment: Faces Faces Pain Scale: Hurts a little bit Pain Location: right knee more than left knee Pain  Descriptors / Indicators: Discomfort Pain Intervention(s): Monitored during session, Limited activity within patient's tolerance, Repositioned    Home Living Family/patient expects to be discharged to:: Private residence Living Arrangements: Spouse/significant other Available Help at Discharge: Family;Available 24 hours/day Type of Home: House Home Access: Ramped entrance       Home Layout: One level Home Equipment: Agricultural consultant (2 wheels);Cane - single point;Grab bars - toilet;Shower seat;Grab bars - tub/shower Additional Comments: Typically on 1.5 to 2 liters supplemental O2 at home    Prior Function            PT Goals (current goals can now be found in the care plan section) Acute Rehab PT Goals Patient Stated Goal: to go home PT Goal Formulation: With patient/family Time For Goal Achievement: 01/15/23 Potential to Achieve Goals: Good Progress towards PT goals: Progressing toward goals    Frequency    Min 1X/week      PT Plan      Co-evaluation              AM-PAC PT "6 Clicks" Mobility   Outcome Measure  Help needed turning from your back to your side while in a flat bed without using bedrails?: None Help needed moving from lying on your back to sitting on the side of a flat bed without using bedrails?: A Little Help needed moving to and from a bed to a chair (including a wheelchair)?: A Little Help needed standing up from a chair using your arms (e.g., wheelchair or bedside chair)?: A Little Help needed to walk in hospital room?: A Lot Help needed climbing 3-5 steps with a railing? : Total 6 Click Score: 16    End of Session Equipment Utilized During Treatment: Oxygen;Gait belt Activity Tolerance: Patient tolerated treatment well Patient left: in chair;with call bell/phone within reach;with chair alarm set   PT Visit Diagnosis: Unsteadiness on feet (R26.81);Other abnormalities of gait and mobility (R26.89);Pain     Time: 1914-7829 PT Time  Calculation (min) (ACUTE ONLY): 19 min  Charges:    $Therapeutic Activity: 8-22 mins PT General Charges $$ ACUTE PT VISIT: 1 Visit                     Donna Bernard, PT, MPT    Ina Homes 01/02/2023, 12:51 PM

## 2023-01-02 NOTE — Progress Notes (Signed)
  Progress Note   Date: 01/02/2023  Patient Name: Jeffery Fernandez        MRN#: [id number]  Clarification of the patient's mental status:  Toxic Metabolic encephalopathy due to centrally acting medications in setting of decreased renal function

## 2023-01-02 NOTE — TOC Transition Note (Signed)
Transition of Care [facility]) - CM/SW Discharge Note   Patient Details  Name: Jeffery Fernandez MRN: [id number] Date of Birth: 04-16-1945  [facility]) CM/SW Contact:  Harriet Masson, RN Phone Number: 01/02/2023, 12:29 PM   Clinical Narrative:     Patient stable for discharge.  Spoke to patient and daughter at bedside.  Patient lives with wife and daughter will stay at patient's home for a few days. Patient has home 02 with [employer]. Patient is active with [facility] and has home 02 with [employer]. Orders for resumption home health and rollator.  Notified Cory with [facility] of home health orders. Notified Jermaine with [employer] of need for portable tank and rollator.  Daughter will transport home.  Address, Phone number and PCP verified.  Final next level of care: Home w Home Health Services Barriers to Discharge: Barriers Resolved   Patient Goals and CMS Choice CMS Medicare.gov Compare Post Acute Care list provided to:: Patient Choice offered to / list presented to : Patient  Discharge Placement               home          Discharge Plan and Services Additional resources added to the After Visit Summary for     Discharge Planning Services: CM Consult Post Acute Care Choice: Durable Medical Equipment, Home Health          DME Arranged: Walker rolling with seat DME Agency: [employer] Date DME Agency Contacted: 01/02/23 Time DME Agency Contacted: 1000 Representative spoke with at DME Agency: Vaughan Basta HH Arranged: PT, OT HH Agency: [facility] Date [facility] Agency Contacted: 01/02/23 Time HH Agency Contacted: 1227 Representative spoke with at [facility] Agency: Kandee Keen  Social Determinants of Health (SDOH) Interventions SDOH Screenings   Food Insecurity: No Food Insecurity (01/01/2023)  Housing: Low Risk  (01/01/2023)  Transportation Needs: No Transportation Needs (01/01/2023)  Utilities: Not At Risk (01/01/2023)  Depression (PHQ2-9): Low Risk  (09/17/2022)   Financial Resource Strain: Low Risk  (12/08/2022)   Received from [facility]  Physical Activity: Unknown (12/08/2022)   Received from [facility]  Social Connections: Socially Integrated (12/08/2022)   Received from [facility]  Stress: Stress Concern Present (12/08/2022)   Received from [facility]  Tobacco Use: Medium Risk (01/01/2023)     Readmission Risk Interventions    01/02/2023   12:27 PM 06/09/2020    2:08 PM  Readmission Risk Prevention Plan  Post Dischage Appt  Complete  Medication Screening  Complete  Transportation Screening Complete Complete  PCP or Specialist Appt within 5-7 Days Complete   Home Care Screening Complete   Medication Review (RN CM) Complete

## 2023-01-02 NOTE — Plan of Care (Signed)
  Problem: Coping: Goal: Ability to adjust to condition or change in health will improve Outcome: Progressing   Problem: Fluid Volume: Goal: Ability to maintain a balanced intake and output will improve Outcome: Progressing

## 2023-01-05 LAB — CULTURE, BLOOD (ROUTINE X 2)
Culture: NO GROWTH
Culture: NO GROWTH

## 2023-01-07 ENCOUNTER — Encounter: Payer: Self-pay | Admitting: Emergency Medicine

## 2023-01-07 ENCOUNTER — Ambulatory Visit: Payer: HMO | Admitting: Emergency Medicine

## 2023-01-07 VITALS — BP 142/78 | HR 96 | Temp 97.7°F | Ht 69.0 in | Wt 189.0 lb

## 2023-01-07 DIAGNOSIS — J849 Interstitial pulmonary disease, unspecified: Secondary | ICD-10-CM

## 2023-01-07 DIAGNOSIS — C3411 Malignant neoplasm of upper lobe, right bronchus or lung: Secondary | ICD-10-CM | POA: Diagnosis not present

## 2023-01-07 DIAGNOSIS — G4733 Obstructive sleep apnea (adult) (pediatric): Secondary | ICD-10-CM

## 2023-01-07 DIAGNOSIS — J9611 Chronic respiratory failure with hypoxia: Secondary | ICD-10-CM | POA: Diagnosis not present

## 2023-01-07 DIAGNOSIS — J449 Chronic obstructive pulmonary disease, unspecified: Secondary | ICD-10-CM

## 2023-01-07 DIAGNOSIS — C3490 Malignant neoplasm of unspecified part of unspecified bronchus or lung: Secondary | ICD-10-CM | POA: Insufficient documentation

## 2023-01-07 NOTE — Assessment & Plan Note (Signed)
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 Scheduled for core needle biopsy of right low jugular supraclavicular adenopathy on 01/14/2023.  Concerning T10 lesion that we will need XRT -Refer to oncology and to radiation oncology for further management and treatment planning.  General Health Maintenance / Followup Plans -Schedule follow-up appointment in 2 months to discuss oncology plan and potential impact of untreated lung cancer.

## 2023-01-07 NOTE — Progress Notes (Signed)
Subjective:    Patient ID: Jeffery Fernandez, male    DOB: 1945-02-10, 77 y.o.   MRN: [id number]  HPI The patient, a 77 year old former smoker with a 37 pack-year history, has a medical history significant for obstructive sleep apnea (not on CPAP), possible COPD, hypertension, diabetes, and stage 4 chronic kidney disease. The patient was hospitalized in late October due to progressive dyspnea and was diagnosed with acute diastolic congestive heart failure (CHF), requiring supplemental oxygen during and after the hospitalization. A CT scan of the chest performed on November 1st revealed a 4.1 x 2.9 x 7.2 cm right upper lobe mass, suggestive of primary lung cancer. Enlarged mediastinal and right hilar nodes, a 1.3 cm solid pulmonary nodule in the lingula, and bilateral reticular and ground glass opacities of unclear etiology were also noted.  A subsequent PET scan on November 18th identified pathologic activity in the neck, including an 11 mm right low jugular supraclavicular node. The right apical lung mass and other pulmonary nodules, including a 7 mm apical right upper lobe nodule and a 2.3 cm more inferior right upper lobe nodule, were hypermetabolic. The lingular nodule noted on the CT was not present on the PET scan. Hypermetabolism was also noted in the right paratracheal node, suggesting mediastinal nodal metastases.  The patient has significant occupational exposure to cotton dust. The patient has been experiencing shortness of breath, which was initially attributed to fluid buildup in the lungs. The patient also reports chronic back pain, which may be related to a lesion at T10 identified on the PET scan. The patient has not been using prescribed CPAP for sleep apnea for several years.   RADIOLOGY Chest CT: 4.1 x 2.9 x 7.2 cm right upper lobe mass, enlarged mediastinal and right hilar nodes, 1.3 cm solid pulmonary nodule in the lingula, bilateral reticular and ground glass opacities  (11/29/2022) PET scan: 11 mm right low jugular supraclavicular node, hypermetabolic right apical lung mass, 7 mm apical right upper lobe nodule, 2.3 cm more inferior right upper lobe nodule, hypermetabolic right paratracheal node (12/16/2022) T10 CT: Eccentric left anterior T10 occult lesion  Review of Systems As per HPI  Past Medical History:  Diagnosis Date   Arrhythmia    Arthritis    Cataract    bilateral cataract extraction with intraoccular lens implants   Chest pain, unspecified    Chronic airway obstruction, not elsewhere classified    PT. DENIES HE HAS COPD   CKD (chronic kidney disease)    Colon polyps    Adenomatous Polyps 2007   Depression    Diabetes mellitus    Diverticulosis    Gout    Heart murmur    History of kidney stones    Hyperlipemia    Hypertension    Nephrolithiasis    Normocytic anemia 04/23/2017   Obstructive sleep apnea (adult) (pediatric)    Other and unspecified hyperlipidemia    Other left bundle branch block    Retinal vascular occlusion, unspecified    right   Shortness of breath    on exertion   Unspecified sleep apnea    Vision loss of right eye    White coat syndrome with diagnosis of hypertension 09/19/2017     Family History  Problem Relation Age of Onset   Heart attack Brother 60       heart attack and CHF/ also some form of EP ablation   Parkinson's disease Brother    Atrial fibrillation Brother    Heart disease  Father    Stroke Father    Uterine cancer Mother    Arthritis/Rheumatoid Cousin    Diabetes Other    Colon cancer Neg Hx    Stomach cancer Neg Hx    Liver cancer Neg Hx    Rectal cancer Neg Hx    Esophageal cancer Neg Hx      Social History   Socioeconomic History   Marital status: Married    Spouse name: Ann   Number of children: 1   Years of education: Not on file   Highest education level: Not on file  Occupational History   Occupation: Retired    Comment: Chartered certified accountant.  Also worked in Medical laboratory scientific officer x 17  years.  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 Nadeen Landau from [place] and moved back there after retirement. He  Has a daughter living in [place]. Quit smoking around 1988. Married   Right-handed   Caffeine: soda daily, decaf coffee   Social Determinants of Health   Financial Resource Strain: Low Risk  (12/08/2022)   Received from [facility]   Overall Financial Resource Strain (CARDIA)    Difficulty of Paying Living Expenses: Not hard at all  Food Insecurity: No Food Insecurity (01/01/2023)   Hunger Vital Sign    Worried About Running Out of Food in the Last Year: Never true    Ran Out of Food in the Last Year: Never true  Transportation Needs: No Transportation Needs (01/01/2023)   PRAPARE - Administrator, Civil Service (Medical): No    Lack of Transportation (Non-Medical): No  Physical Activity: Unknown (12/08/2022)   Received [facility]   Exercise Vital Sign    Days of Exercise per Week: 0 days    Minutes of Exercise per Session: Not on file  Stress: Stress Concern Present (12/08/2022)   Received from [facility] Occupational [facility] - Occupational Stress Questionnaire    Feeling of Stress : To some extent  Social Connections: Socially Integrated (12/08/2022)   Received from [facility]   Social Network    How would you rate your social network (family, work, friends)?: Good participation with  social networks  Intimate Partner Violence: Not At Risk (01/01/2023)   Humiliation, Afraid, Rape, and Kick questionnaire    Fear of Current or Ex-Partner: No    Emotionally Abused: No    Physically Abused: No    Sexually Abused: No     Allergies  Allergen Reactions   Amlodipine Swelling    Patient report facial swelling at high doses   Penicillins Other (See Comments)    Possibly rash - not sure Has patient had a PCN reaction causing immediate rash, facial/tongue/throat swelling, SOB or lightheadedness with hypotension:unknown Has patient had a PCN reaction causing severe rash involving mucus membranes  or skin necrosis: Unknown Has patient had a PCN reaction that required hospitalization: Unknown Has patient had a PCN reaction occurring within the last 10 years: No Childhood reaction If all of the above answers are "NO", then may proceed with Cephalosporin use.    Hydralazine Other (See Comments)    Dizziness     Outpatient Medications Prior to Visit  Medication Sig Dispense Refill   acetaminophen (TYLENOL) 500 MG tablet Take 1,000 mg by mouth every 6 (six) hours as needed (for pain.).     aspirin EC 81 MG tablet Take 81 mg by mouth daily. Swallow whole.     atorvastatin (LIPITOR) 40 MG tablet Take 40 mg by mouth at bedtime.     buPROPion (WELLBUTRIN XL) 150 MG 24 hr tablet Take 150 mg by mouth every morning.     busPIRone (BUSPAR) 10 MG tablet Take 10 mg by mouth in the morning.     Dulaglutide (TRULICITY) 1.5 MG/0.5ML SOAJ Inject 1.5 mg into the skin daily. Takes on Thursdays     gabapentin (NEURONTIN) 100 MG capsule Take 1 capsule (100 mg total) by mouth daily.     insulin glargine (LANTUS) 100 UNIT/ML injection Inject 40 Units into the skin in the morning.     sertraline (ZOLOFT) 100 MG tablet Take 100 mg by mouth in the morning.     Turmeric (QC TUMERIC COMPLEX) 500 MG CAPS Take 2,500 mg by mouth in the morning and at bedtime.     torsemide (DEMADEX) 20 MG tablet Take 2  tablets (40 mg total) by mouth daily. (Patient taking differently: Take 40 mg by mouth in the morning.) 60 tablet 0   No facility-administered medications prior to visit.        Objective:   Physical Exam  Vitals:   01/07/23 0957 01/07/23 0959  BP: (!) 142/78   Pulse: (!) 105 96  Temp: 97.7 F (36.5 C)   TempSrc: Oral   SpO2: 97%   Weight: 189 lb (85.7 kg)   Height: 5\' 9"  (1.753 m)    Gen: Pleasant, well-nourished, elderly gentleman on oxygen, in no distress,  normal affect  ENT: No lesions,  mouth clear,  oropharynx clear, no postnasal drip  Neck: No JVD, no stridor  Lungs: No use of accessory muscles, quite distant, no wheezing on normal respiration, no wheeze on forced expiration.  Few soft basilar inspiratory crackles  Cardiovascular: RRR, heart sounds normal, no murmur or gallops, no peripheral edema  Musculoskeletal: No deformities, no cyanosis or clubbing  Neuro: alert, awake, non focal  Skin: Warm, no lesions or rash     Assessment & Plan:  Lung cancer (HCC) Suspected Lung Cancer Hypermetabolic right upper lobe mass and mediastinal nodal metastases identified on PET scan.  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 Scheduled for core needle biopsy of right low jugular supraclavicular adenopathy on 01/14/2023.  Concerning T10 lesion that we will need XRT -Refer to oncology and to radiation oncology for further management and treatment planning.  General Health Maintenance / Followup Plans -Schedule follow-up appointment in 2 months to discuss oncology plan and potential impact of untreated lung cancer.  OSA (obstructive sleep apnea) He has not worn his CPAP in over 5 years  Obstructive Sleep Apnea (OSA) Not currently using CPAP. Potential impact on blood pressure and diastolic heart failure management. No immediate plan to address OSA. -Consider reevaluation of OSA management at future visits.  Chronic  hypoxemic respiratory failure (HCC) Suspect multifactorial  due to diastolic CHF, ILD from cotton dust exposure, probable some COPD although he is not currently on therapy  ILD (interstitial lung disease) (HCC) Interstitial Lung Disease Evidence of old inflammation and possible subtle scar formation, likely related to occupational exposure. No current treatment plan, but potential for future discussion of inhaled medications. -Consider future discussion of inhaled medications for symptom management.  COPD (chronic obstructive pulmonary disease) (HCC) Probable COPD.  We will approach pulmonary function testing, possible empiric BD therapy at his next follow-up visit   Levy Pupa, MD, PhD 01/07/2023, 10:45 AM [facility] [phone] or if no answer before 7:00PM call [phone] For any issues after 7:00PM please call eLink [phone]

## 2023-01-07 NOTE — Assessment & Plan Note (Signed)
Interstitial Lung Disease Evidence of old inflammation and possible subtle scar formation, likely related to occupational exposure. No current treatment plan, but potential for future discussion of inhaled medications. -Consider future discussion of inhaled medications for symptom management.

## 2023-01-07 NOTE — Patient Instructions (Addendum)
VISIT SUMMARY:  During your visit, we discussed your recent health concerns, including your suspected lung cancer, interstitial lung disease, obstructive sleep apnea, diastolic congestive heart failure, chronic kidney disease, hypertension, and diabetes. We reviewed your recent hospital stay and imaging results, and we have outlined a plan for further management and treatment.  YOUR PLAN:  -SUSPECTED LUNG CANCER: You have a mass and a nodule in your right upper lung that is likely cancerous, along with signs that it may have spread to nearby lymph nodes. We have scheduled a core needle biopsy of the right low jugular supraclavicular adenopathy on 01/14/2023 to confirm the diagnosis. You will be referred to oncology and radiation oncology for further management and treatment planning.  -INTERSTITIAL LUNG DISEASE: This condition involves inflammation and possible scarring in your lungs, likely due to your occupational exposure to cotton dust. There is no current treatment plan, but we may discuss inhaled medications in the future to help manage your symptoms.  -OBSTRUCTIVE SLEEP APNEA (OSA): OSA is a condition where your breathing stops and starts during sleep. You are not currently using your CPAP machine, which could affect your blood pressure and heart failure management. We may need to reevaluate your OSA management in future visits.  -DIASTOLIC CONGESTIVE HEART FAILURE (CHF): This type of heart failure occurs when the heart has difficulty relaxing and filling with blood. You were recently hospitalized for this condition and showed improvement with diuresis (removal of excess fluid). We will continue your current management and monitor for any changes in your symptoms.  -CHRONIC KIDNEY DISEASE (CKD) STAGE 4: CKD is a condition where the kidneys gradually lose function. You are at stage 4, which means there is significant loss of kidney function and a potential need for dialysis in the future. We will  refer you to nephrology for further management and treatment planning.  -HYPERTENSION AND DIABETES: These are chronic conditions that you have been managing. There are no changes to your current management plan at this time.  -HYPOXEMIA: Please continue to wear your oxygen at 1-2 L/min.  Goal saturations > 90%.   INSTRUCTIONS:  Please schedule a follow-up appointment in 2 months to discuss the oncology plan and the potential impact of untreated lung cancer.

## 2023-01-07 NOTE — Assessment & Plan Note (Signed)
Probable COPD.  We will approach pulmonary function testing, possible empiric BD therapy at his next follow-up visit

## 2023-01-07 NOTE — Assessment & Plan Note (Signed)
Suspect multifactorial due to diastolic CHF, ILD from cotton dust exposure, probable some COPD although he is not currently on therapy

## 2023-01-07 NOTE — Assessment & Plan Note (Signed)
He has not worn his CPAP in over 5 years  Obstructive Sleep Apnea (OSA) Not currently using CPAP. Potential impact on blood pressure and diastolic heart failure management. No immediate plan to address OSA. -Consider reevaluation of OSA management at future visits.

## 2023-01-08 NOTE — Progress Notes (Signed)
Location of tumor and Histology per Pathology Report: ***  Biopsy: ***  Past/Anticipated interventions by surgeon, if any: right    Past/Anticipated interventions by medical oncology, if any: Chemotherapy ***    Pain issues, if any:  {:18581} {PAIN DESCRIPTION:21022940}  SAFETY ISSUES: Prior radiation? {:18581} Pacemaker/ICD? {:18581} Possible current pregnancy? no Is the patient on methotrexate? {:18581}  Current Complaints / other details:  ***     ***

## 2023-01-09 ENCOUNTER — Encounter: Payer: Self-pay | Admitting: Radiation Oncology

## 2023-01-09 ENCOUNTER — Ambulatory Visit
Admission: RE | Admit: 2023-01-09 | Discharge: 2023-01-09 | Disposition: A | Discharge status: Home / Self Care | Payer: HMO | Source: Ambulatory Visit | Attending: Radiation Oncology | Admitting: Radiation Oncology

## 2023-01-09 VITALS — BP 137/82 | HR 115 | Temp 97.5°F | Resp 20 | Ht 69.0 in | Wt 189.4 lb

## 2023-01-09 DIAGNOSIS — C7951 Secondary malignant neoplasm of bone: Secondary | ICD-10-CM | POA: Diagnosis present

## 2023-01-09 DIAGNOSIS — Z79899 Other long term (current) drug therapy: Secondary | ICD-10-CM | POA: Insufficient documentation

## 2023-01-09 DIAGNOSIS — I130 Hypertensive heart and chronic kidney disease with heart failure and stage 1 through stage 4 chronic kidney disease, or unspecified chronic kidney disease: Secondary | ICD-10-CM | POA: Insufficient documentation

## 2023-01-09 DIAGNOSIS — C3411 Malignant neoplasm of upper lobe, right bronchus or lung: Secondary | ICD-10-CM | POA: Insufficient documentation

## 2023-01-09 DIAGNOSIS — Z87442 Personal history of urinary calculi: Secondary | ICD-10-CM | POA: Insufficient documentation

## 2023-01-09 DIAGNOSIS — Z860101 Personal history of adenomatous and serrated colon polyps: Secondary | ICD-10-CM | POA: Diagnosis not present

## 2023-01-09 DIAGNOSIS — E1136 Type 2 diabetes mellitus with diabetic cataract: Secondary | ICD-10-CM | POA: Insufficient documentation

## 2023-01-09 DIAGNOSIS — Z7985 Long-term (current) use of injectable non-insulin antidiabetic drugs: Secondary | ICD-10-CM | POA: Insufficient documentation

## 2023-01-09 DIAGNOSIS — R011 Cardiac murmur, unspecified: Secondary | ICD-10-CM | POA: Insufficient documentation

## 2023-01-09 DIAGNOSIS — G4733 Obstructive sleep apnea (adult) (pediatric): Secondary | ICD-10-CM | POA: Diagnosis not present

## 2023-01-09 DIAGNOSIS — Z794 Long term (current) use of insulin: Secondary | ICD-10-CM | POA: Diagnosis not present

## 2023-01-09 DIAGNOSIS — Z801 Family history of malignant neoplasm of trachea, bronchus and lung: Secondary | ICD-10-CM | POA: Insufficient documentation

## 2023-01-09 DIAGNOSIS — J9811 Atelectasis: Secondary | ICD-10-CM | POA: Insufficient documentation

## 2023-01-09 DIAGNOSIS — R058 Other specified cough: Secondary | ICD-10-CM | POA: Diagnosis not present

## 2023-01-09 DIAGNOSIS — Z87891 Personal history of nicotine dependence: Secondary | ICD-10-CM | POA: Diagnosis not present

## 2023-01-09 DIAGNOSIS — E1122 Type 2 diabetes mellitus with diabetic chronic kidney disease: Secondary | ICD-10-CM | POA: Insufficient documentation

## 2023-01-09 DIAGNOSIS — E785 Hyperlipidemia, unspecified: Secondary | ICD-10-CM | POA: Insufficient documentation

## 2023-01-09 DIAGNOSIS — Z7982 Long term (current) use of aspirin: Secondary | ICD-10-CM | POA: Insufficient documentation

## 2023-01-09 DIAGNOSIS — J449 Chronic obstructive pulmonary disease, unspecified: Secondary | ICD-10-CM | POA: Insufficient documentation

## 2023-01-09 DIAGNOSIS — N189 Chronic kidney disease, unspecified: Secondary | ICD-10-CM | POA: Insufficient documentation

## 2023-01-09 DIAGNOSIS — I509 Heart failure, unspecified: Secondary | ICD-10-CM | POA: Diagnosis not present

## 2023-01-09 NOTE — Progress Notes (Signed)
Radiation Oncology         ([phone]) [phone] ________________________________  Initial Outpatient Consultation  Name: Jeffery Fernandez MRN: [id number]  Date: 01/09/2023  DOB: 12/04/1945  XB:[id number], Onalee Hua, MD  Leslye Peer, MD   REFERRING PHYSICIAN: Leslye Peer, MD  DIAGNOSIS: The primary encounter diagnosis was Metastasis to bone [facility]). A diagnosis of Malignant neoplasm of right upper lobe of lung (HCC) was also pertinent to this visit.  Right upper lung mass concerning for primary lung malignancy, with PET evidence of osseous metastatic disease to T10 and mediastinal and right hilar metastatic lymphadenopathy - biopsies scheduled for 01/14/23   HISTORY OF PRESENT ILLNESS::Jeffery Fernandez is a 77 y.o. male who is accompanied by his supportive daughter and wife. he is seen as a courtesy of Dr. Delton Coombes for an opinion concerning radiation therapy as part of management for his recently diagnosed/suspected osseous metastatic disease to T10 from a right lung cancer primary (biopsies pending). The patient does have a significant smoking history and occupational history significant for cotton dust exposure. He also had a medical history notable for COPD, OSA, ILD, and chronic hypoxemic respiratory failure which is likely multifactorial.   The patient presented to the ED on 11/24/22 due to worsening SOB. He was admitted and placed on a high flow Barnes. A chest x-ray was performed which showed cardiomegaly and diffuse interstitial pattern that reflected pulmonary edema and peripheral midlung densities and atelectasis. He was given lasix and albumin with improvement in his hypoxia. An ECHO was also done on 11/25/2022 that showed an LVEF of 55-60, with mild concentric hypertrophy of LV. He was subsequently diagnosed with CHF with a preserved ejection fraction.   While inpatient, he had a high resolution chest CT performed on 11/28/22 which revealed a right upper lobe mass measuring up to 4.1 x 2.9 x 7.2 cm  concerning for primary lung malignancy. There was also evidence of mildly enlarged mediastinal and right hilar lymph nodes as well as solid lingular pulmonary nodule measuring 1.3 cm. He also continued to have positive orthostatics and decompensated requiring increased oxygen required from 2 L to 5 L. He was subsequently started on midodrine 5 mg BID for borderline hypotension. Prior to discharge on 11/05, he was able to weaned down to 3L and maintain sats in the 90's. He was also discharged home on 4L O2.   After being discharged, he accordingly presented for a PET scan on 12/16/22 which demonstrated: the right apical mass concerning for primary bronchogenic carcinoma with cervicothoracic nodal metastasis, hypermetabolic pulmonary nodules in the RUL possibly representing pulmonary metastatic disease, an indeterminate  hypermetabolic medial left inguinal node vs subcutaneous nodule with differential considerations including a reactive etiology vs metastatic disease, and evidence of osseous metastatic disease consisting of a left anterior T10 occult lesion. He does have chronic back pain which may very well be related to the T10 lesion.  Before he could be evaluated by OP pulmonology, he presented to the ED on 12/03 with c/o right leg pain and altered mental status. He was admitted for further work-up which consisted of a CT of the head which showed no evidence of intracranial metastatic disease. His altered mental status was suspected to be possibly secondary to toxic metabolites from central acting home meds in the setting of reduced renal function. These medications were held and his mentation improved. He was also found to be hyperactive and was subsequently restarted on Zoloft, and his gabapentin was decreased to to 100 mg daily. His  right leg pain seemed to be incidental and possibly due to a pulled muscle based on an LE doppler showing no evidence of DVT. His mentation returned to baseline at discharge on  01/02/23.    Since being discharged, the patient was seen in consultation by Dr. Delton Coombes on 01/07/23. For the lesion at T10, Dr. Delton Coombes has recommended radiation therapy which we will discuss in detail today. A referral to medical oncology has also been placed and he is scheduled to undergo a needle core biopsy of the right lower jugular supraclavicular adenopathy on 01/14/2023.   Today the patient notes lower left sided back pain that has persisted since his lower back surgery.  Denies any other back pain.  He denies any numbness or tingling to his lower extremities.  He does note some leg weakness since completing physical therapy yesterday.  He denies any shortness of breath but does have a productive cough. He denies any hemoptysis.  PREVIOUS RADIATION THERAPY: No  PAST MEDICAL HISTORY:  Past Medical History:  Diagnosis Date   Arrhythmia    Arthritis    Cataract    bilateral cataract extraction with intraoccular lens implants   Chest pain, unspecified    Chronic airway obstruction, not elsewhere classified    PT. DENIES HE HAS COPD   CKD (chronic kidney disease)    Colon polyps    Adenomatous Polyps 2007   Depression    Diabetes mellitus    Diverticulosis    Gout    Heart murmur    History of kidney stones    Hyperlipemia    Hypertension    Nephrolithiasis    Normocytic anemia 04/23/2017   Obstructive sleep apnea (adult) (pediatric)    Other and unspecified hyperlipidemia    Other left bundle branch block    Retinal vascular occlusion, unspecified    right   Shortness of breath    on exertion   Unspecified sleep apnea    Vision loss of right eye    White coat syndrome with diagnosis of hypertension 09/19/2017    PAST SURGICAL HISTORY: Past Surgical History:  Procedure Laterality Date   ANTERIOR CERVICAL DECOMP/DISCECTOMY FUSION N/A 06/26/2016   Procedure: Re-Exploration of Cervical Wound;  Surgeon: Donalee Citrin, MD;  Location: [facility] OR;  Service: Neurosurgery;  Laterality:  N/A;   ANTERIOR CERVICAL DECOMP/DISCECTOMY FUSION N/A 06/26/2016   Procedure: ANTERIOR CERVICAL DECOMPRESSION/DISCECTOMY FUSION, INTERBODY PROSTESIS, PLATE, CERVICAL THREE CERVICAL FOUR, CERVICAL FOUR CERVICAL FIVE CERVICAL SIX;  Surgeon: Tressie Stalker, MD;  Location: [facility] OR;  Service: Neurosurgery;  Laterality: N/A;   APPLICATION OF WOUND VAC  06/05/2020   Procedure: APPLICATION OF WOUND VAC;  Surgeon: Tressie Stalker, MD;  Location: [facility] OR;  Service: Neurosurgery;;   BACK SURGERY     CATARACT EXTRACTION Bilateral    KIDNEY STONE SURGERY     KYPHOPLASTY N/A 01/19/2018   Procedure: KYPHOPLASTY LUMBAR THREE;  Surgeon: Tressie Stalker, MD;  Location: [facility] OR;  Service: Neurosurgery;  Laterality: N/A;  KYPHOPLASTY LUMBAR THREE   LUMBAR WOUND DEBRIDEMENT N/A 06/05/2020   Procedure: LUMBAR WOUND DEBRIDEMENT;  Surgeon: Tressie Stalker, MD;  Location: [facility] OR;  Service: Neurosurgery;  Laterality: N/A;    FAMILY HISTORY:  Family History  Problem Relation Age of Onset   Cancer Mother    Uterine cancer Mother    Heart disease Father    Stroke Father    Cancer Sister        Lung cancer   Heart attack Brother 80  heart attack and CHF/ also some form of EP ablation   Parkinson's disease Brother    Atrial fibrillation Brother    Arthritis/Rheumatoid Cousin    Diabetes Other    Colon cancer Neg Hx    Stomach cancer Neg Hx    Liver cancer Neg Hx    Rectal cancer Neg Hx    Esophageal cancer Neg Hx     SOCIAL HISTORY:  Social History   Tobacco Use   Smoking status: Former    Current packs/day: 0.00    Average packs/day: 1.5 packs/day for 25.0 years (37.5 ttl pk-yrs)    Types: Cigarettes    Start date: 01/28/1961    Quit date: 01/28/1986    Years since quitting: 36.9   Smokeless tobacco: Never  Vaping Use   Vaping status: Never Used  Substance Use Topics   Alcohol use: Not Currently    Comment: Very rare   Drug use: No    ALLERGIES:  Allergies  Allergen Reactions   Amlodipine  Swelling    Patient report facial swelling at high doses   Penicillins Other (See Comments)    Possibly rash - not sure Has patient had a PCN reaction causing immediate rash, facial/tongue/throat swelling, SOB or lightheadedness with hypotension:unknown Has patient had a PCN reaction causing severe rash involving mucus membranes or skin necrosis: Unknown Has patient had a PCN reaction that required hospitalization: Unknown Has patient had a PCN reaction occurring within the last 10 years: No Childhood reaction If all of the above answers are "NO", then may proceed with Cephalosporin use.    Hydralazine Other (See Comments)    Dizziness    MEDICATIONS:  Current Outpatient Medications  Medication Sig Dispense Refill   acetaminophen (TYLENOL) 500 MG tablet Take 1,000 mg by mouth every 6 (six) hours as needed (for pain.).     aspirin EC 81 MG tablet Take 81 mg by mouth daily. Swallow whole.     atorvastatin (LIPITOR) 40 MG tablet Take 40 mg by mouth at bedtime.     buPROPion (WELLBUTRIN XL) 150 MG 24 hr tablet Take 150 mg by mouth every morning.     busPIRone (BUSPAR) 10 MG tablet Take 10 mg by mouth in the morning.     Dulaglutide (TRULICITY) 1.5 MG/0.5ML SOAJ Inject 1.5 mg into the skin daily. Takes on Thursdays     gabapentin (NEURONTIN) 100 MG capsule Take 1 capsule (100 mg total) by mouth daily.     insulin glargine (LANTUS) 100 UNIT/ML injection Inject 40 Units into the skin in the morning.     sertraline (ZOLOFT) 100 MG tablet Take 100 mg by mouth in the morning.     torsemide (DEMADEX) 20 MG tablet Take 2 tablets (40 mg total) by mouth daily. (Patient taking differently: Take 40 mg by mouth in the morning.) 60 tablet 0   Turmeric (QC TUMERIC COMPLEX) 500 MG CAPS Take 2,500 mg by mouth in the morning and at bedtime.     No current facility-administered medications for this encounter.    REVIEW OF SYSTEMS: Notable for that above.   PHYSICAL EXAM:  height is 5\' 9"  (1.753 m) and  weight is 189 lb 6 oz (85.9 kg). His temporal temperature is 97.5 F (36.4 C) (abnormal). His blood pressure is 137/82 and his pulse is 115 (abnormal). His respiration is 20 and oxygen saturation is 94%.   General: Alert and oriented, in no acute distress HEENT: Head is normocephalic. Extraocular movements are intact.  Neck:  Neck is supple, no palpable cervical or supraclavicular lymphadenopathy. Heart: Regular in rate and rhythm with no murmurs, rubs, or gallops. Chest: Clear to auscultation bilaterally, with no rhonchi, wheezes, or rales. Abdomen: Soft, nontender, nondistended, with no rigidity or guarding. Extremities: No cyanosis or edema. Lymphatics: see Neck Exam Skin: No concerning lesions. Musculoskeletal: symmetric strength and muscle tone throughout. Neurologic: Cranial nerves II through XII are grossly intact. No obvious focalities. Speech is fluent. Coordination is intact. Psychiatric: Judgment and insight are intact. Affect is appropriate.   ECOG = 1  0 - Asymptomatic (Fully active, able to carry on all predisease activities without restriction)  1 - Symptomatic but completely ambulatory (Restricted in physically strenuous activity but ambulatory and able to carry out work of a light or sedentary nature. For example, light housework, office work)  2 - Symptomatic, <50% in bed during the day (Ambulatory and capable of all self care but unable to carry out any work activities. Up and about more than 50% of waking hours)  3 - Symptomatic, >50% in bed, but not bedbound (Capable of only limited self-care, confined to bed or chair 50% or more of waking hours)  4 - Bedbound (Completely disabled. Cannot carry on any self-care. Totally confined to bed or chair)  5 - Death   Santiago Glad MM, Creech RH, Tormey DC, et al. [phone]). "Toxicity and response criteria of the [facility]". Am. Evlyn Clines. Oncol. 5 (6): 649-55  LABORATORY DATA:  Lab Results  Component Value Date    WBC 13.9 (H) 01/02/2023   HGB 10.5 (L) 01/02/2023   HCT 33.2 (L) 01/02/2023   MCV 92.2 01/02/2023   PLT 229 01/02/2023   NEUTROABS 14.6 (H) 12/31/2022   Lab Results  Component Value Date   NA 139 01/02/2023   K 3.4 (L) 01/02/2023   CL 99 01/02/2023   CO2 27 01/02/2023   GLUCOSE 155 (H) 01/02/2023   BUN 47 (H) 01/02/2023   CREATININE 4.04 (H) 01/02/2023   CALCIUM 8.9 01/02/2023      RADIOGRAPHY: VAS [place] LOWER EXTREMITY VENOUS (DVT) (7a-7p) Result Date: 01/01/2023  Lower Venous DVT Study Patient Name:  Jeffery Fernandez  Date of Exam:   12/31/2022 Medical Rec #: [id number]      Accession #:    [id number] Date of Birth: May 23, 1945       Patient Gender: M Patient Age:   49 years Exam Location:  [facility] Procedure:      VAS [place] LOWER EXTREMITY VENOUS (DVT) Referring Phys: [facility] --------------------------------------------------------------------------------  Indications: Pain.  Limitations: Patient movement due to pain. Comparison Study: No prior exam. Performing Technologist: Fernande Bras  Examination Guidelines: A complete evaluation includes B-mode imaging, spectral Doppler, color Doppler, and power Doppler as needed of all accessible portions of each vessel. Bilateral testing is considered an integral part of a complete examination. Limited examinations for reoccurring indications may be performed as noted. The reflux portion of the exam is performed with the patient in reverse Trendelenburg.  +---------+---------------+---------+-----------+----------+----------------+ RIGHT    CompressibilityPhasicitySpontaneityPropertiesThrombus Aging   +---------+---------------+---------+-----------+----------+----------------+ CFV      Full           Yes      Yes                                   +---------+---------------+---------+-----------+----------+----------------+ SFJ      Full                                                           +---------+---------------+---------+-----------+----------+----------------+  FV Prox  Full                                                          +---------+---------------+---------+-----------+----------+----------------+ FV Mid   Full                                                          +---------+---------------+---------+-----------+----------+----------------+ FV DistalFull                                                          +---------+---------------+---------+-----------+----------+----------------+ PFV      Full                                                          +---------+---------------+---------+-----------+----------+----------------+ POP      Full           Yes      Yes                                   +---------+---------------+---------+-----------+----------+----------------+ PTV      Full                                                          +---------+---------------+---------+-----------+----------+----------------+ PERO                                                  Patent by color. +---------+---------------+---------+-----------+----------+----------------+   +----+---------------+---------+-----------+----------+--------------+ LEFTCompressibilityPhasicitySpontaneityPropertiesThrombus Aging +----+---------------+---------+-----------+----------+--------------+ CFV Full           Yes      Yes                                 +----+---------------+---------+-----------+----------+--------------+     Summary: RIGHT: - There is no evidence of deep vein thrombosis in the lower extremity.  - No cystic structure found in the popliteal fossa.  LEFT: - No evidence of common femoral vein obstruction.   *See table(s) above for measurements and observations. Electronically signed by Coral Else MD on 01/01/2023 at 7:13:05 PM.    Final    DG Abd 1 View Result Date: 01/01/2023 CLINICAL DATA:  Abdominal pain. EXAM:  ABDOMEN - 1 VIEW COMPARISON:  PET-CT, 12/16/2022. FINDINGS: Normal bowel gas pattern. 1.1 cm calculus projects in the right kidney, stable from the prior PET-CT. No left intrarenal stone or evidence of a  ureteral stone. Elevated right hemidiaphragm, also stable. No acute skeletal abnormality. Previous posterior lumbar spine fusion at L4-L5 and previous vertebroplasty at L3, L4 and L5. IMPRESSION: 1. No acute findings. 2. Right intrarenal calculus. Electronically Signed   By: Amie Portland M.D.   On: 01/01/2023 10:29   DG Knee 1-2 Views Right Result Date: 12/31/2022 CLINICAL DATA:  Right hip pain and right leg pain worse since yesterday. Difficulty walking. EXAM: RIGHT KNEE - 1-2 VIEW COMPARISON:  None Available. FINDINGS: Mild degenerative changes in the right knee with mild medial compartment narrowing. No evidence of acute fracture or dislocation. No focal bone lesion or bone destruction. No significant effusions. Vascular calcifications in the soft tissues. IMPRESSION: Mild degenerative changes in the right knee with medial compartment narrowing. No acute bony abnormalities. Electronically Signed   By: Burman Nieves M.D.   On: 12/31/2022 22:31   DG HIP UNILAT WITH PELVIS 1V RIGHT Result Date: 12/31/2022 CLINICAL DATA:  Right hip pain with difficulty walking. History of lung cancer. EXAM: DG HIP (WITH OR WITHOUT PELVIS) 1V RIGHT COMPARISON:  PET-CT 12/16/2022 FINDINGS: Mild degenerative changes in the right hip with narrowing and sclerosis of the superior acetabulum and small osteophyte formation. The hip is rotated, limiting evaluation of the femoral neck but no acute displaced fractures are identified as visualized. No focal bone lesion or bone destruction. Pelvis and left hip appear intact. SI joints and symphysis pubis are not displaced. Postoperative changes in the lower lumbar spine. Vascular calcifications. IMPRESSION: Mild degenerative changes in the right hip. No acute displaced fractures are  identified. Electronically Signed   By: Burman Nieves M.D.   On: 12/31/2022 22:30   CT Head Wo Contrast Result Date: 12/31/2022 CLINICAL DATA:  Mental status change EXAM: CT HEAD WITHOUT CONTRAST TECHNIQUE: Contiguous axial images were obtained from the base of the skull through the vertex without intravenous contrast. RADIATION DOSE REDUCTION: This exam was performed according to the departmental dose-optimization program which includes automated exposure control, adjustment of the mA and/or kV according to patient size and/or use of iterative reconstruction technique. COMPARISON:  05/13/2020 CT head FINDINGS: Brain: No evidence of acute infarction, hemorrhage, mass, mass effect, or midline shift. No hydrocephalus or extra-axial fluid collection. Periventricular white matter changes, likely the sequela of chronic small vessel ischemic disease. Mildly advanced cerebral atrophy for age. Basal ganglia calcifications. Vascular: No hyperdense vessel. Atherosclerotic calcifications in the intracranial carotid and vertebral arteries. Skull: Negative for fracture or focal lesion. Sinuses/Orbits: No acute finding. Other: The mastoid air cells are well aerated. IMPRESSION: No acute intracranial process. Electronically Signed   By: Wiliam Ke M.D.   On: 12/31/2022 19:42   DG Chest Portable 1 View Result Date: 12/31/2022 CLINICAL DATA:  Altered mental status, shortness of breath. EXAM: PORTABLE CHEST 1 VIEW COMPARISON:  December 01, 2022. FINDINGS: Stable cardiomegaly. Left lung is clear. Minimal right basilar subsegmental atelectasis is noted with small pleural effusion. Bony thorax is unremarkable. IMPRESSION: Minimal right basilar subsegmental atelectasis with small right pleural effusion. Electronically Signed   By: Lupita Raider M.D.   On: 12/31/2022 19:37   NM PET Image Initial (PI) Skull Base To Thigh Result Date: 12/23/2022 CLINICAL DATA:  Initial treatment strategy for right upper lobe lung mass on  chest CT. EXAM: NUCLEAR MEDICINE PET SKULL BASE TO THIGH TECHNIQUE: 9.3 mCi F-18 FDG was injected intravenously. Full-ring PET imaging was performed from the skull base to thigh after the radiotracer. CT data was obtained and used for  attenuation correction and anatomic localization. Fasting blood glucose: 87 mg/dl COMPARISON:  Chest CT 98/11/9145. FINDINGS: Mediastinal blood pool activity: SUV max 2.6 Liver activity: SUV max NA NECK: Left posterior triangle node measures 5 mm and a S.U.V. max of 4.7 on 36/4. Right low jugular/supraclavicular node measures 11 mm and a S.U.V. max of 10.8 on 47/4. Incidental CT findings: Bilateral carotid atherosclerosis. CHEST: The right apical lung mass is hypermetabolic at 3.8 cm and a S.U.V. max of 27.2 on 55/4. A satellite more peripheral right apical nodule measures 7 mm and a S.U.V. max of 11.8 on the same image. More inferior right upper lobe pulmonary nodule measures 2.3 cm and a S.U.V. max of 26.7 on 63/4. There is no lingular nodule. The inferior right upper lobe nodule was likely inadvertently labeled as lingular on the prior CT. Mediastinal nodal metastasis, including a right paratracheal nodal mass of 1.7 cm and a S.U.V. max of 20.4 on 68/4. Right lower lobe ground-glass opacity and mild hypermetabolism on 84/4, likely related to the known interstitial lung disease versus less likely superimposed pneumonia. Incidental CT findings: Deferred to recent diagnostic CT. Aortic and coronary artery calcification. Mild cardiomegaly. ABDOMEN/PELVIS: No abdominopelvic parenchymal hypermetabolism. A left medial inguinal node versus subcutaneous nodule measures 1.0 cm and a S.U.V. max of 19.1 on 190/4. Incidental CT findings: Mild right adrenal thickening. Normal left adrenal gland. Mild renal cortical thinning bilaterally. Right renal collecting system 11 mm calculus. Abdominal aortic atherosclerosis. Scattered colonic diverticula. SKELETON: Eccentric left anterior T10 CT occult  lesion at a S.U.V. max of 17.0. Incidental CT findings: Lumbar spine fixation. Prior vertebral augmentation at L 3 through L5. IMPRESSION: 1. Right apical primary bronchogenic carcinoma with cervicothoracic nodal and osseous metastasis as detailed above. 2. Smaller hypermetabolic right upper lobe nodules could represent metachronous primaries or pulmonary metastasis. 3. Medial left inguinal node versus subcutaneous nodule is hypermetabolic and indeterminate for an atypical distribution of metastatic disease versus reactive etiology. 4. Incidental findings, including: Interstitial lung disease as detailed on dedicated chest CT. Coronary artery atherosclerosis. Aortic Atherosclerosis (ICD10-I70.0). Right nephrolithiasis. Electronically Signed   By: Jeronimo Greaves M.D.   On: 12/23/2022 10:39      IMPRESSION: Right upper lung mass concerning for primary lung malignancy, with PET evidence of osseous metastatic disease to T10 and mediastinal and right hilar metastatic lymphadenopathy  We have reviewed the patient's case and most recent imaging. PET from 12/16/22 suggests a T10 osseous metastasis.  The patient is fortunately not experiencing any symptoms from this area of likely disease.  However, radiation is recommended to prevent locoregional disease progression.   Patient is scheduled for biopsy of the hypermetabolic supraclavicular node on 01/14/23.  He will then meet with medical oncology to discuss his treatment options.  Today, I talked to the patient and family about the findings and work-up thus far.  We discussed the natural history of spinal metastases and general treatment, highlighting the role of radiotherapy in the management.  We discussed the available radiation techniques, and focused on the details of logistics and delivery.  We reviewed the anticipated acute and late sequelae associated with radiation in this setting.  The patient was encouraged to ask questions that I answered to the best of my  ability. A patient consent form was discussed and signed.  We retained a copy for our records.  The patient would like to proceed with radiation and will be scheduled for CT simulation.  PLAN: Patient is scheduled for CT simulation on 01/17/2023.  Anticipate 30 Gy in 10 fractions to the hypermetabolic T10 lesion.    60 minutes of total time was spent for this patient encounter, including preparation, face-to-face counseling with the patient and coordination of care, physical exam, and documentation of the encounter.   ------------------------------------------------   Bryan Lemma, PA-C   Billie Lade, PhD, MD   [facility]  Radiation Oncology Direct Dial: [phone]  Fax: [phone] [email or web address]    This document serves as a record of services personally performed by Antony Blackbird, MD and Bryan Lemma, PA-C. It was created on his behalf by Neena Rhymes, a trained medical scribe. The creation of this record is based on the scribe's personal observations and the provider's statements to them. This document has been checked and approved by the attending provider.

## 2023-01-13 ENCOUNTER — Other Ambulatory Visit (HOSPITAL_COMMUNITY): Payer: Self-pay | Admitting: Student

## 2023-01-14 ENCOUNTER — Ambulatory Visit (HOSPITAL_COMMUNITY)
Admission: RE | Admit: 2023-01-14 | Discharge: 2023-01-14 | Disposition: A | Discharge status: Home / Self Care | Payer: HMO | Source: Ambulatory Visit | Attending: Pulmonary Disease | Admitting: Pulmonary Disease

## 2023-01-14 ENCOUNTER — Other Ambulatory Visit: Payer: Self-pay

## 2023-01-14 DIAGNOSIS — Z87891 Personal history of nicotine dependence: Secondary | ICD-10-CM | POA: Diagnosis not present

## 2023-01-14 DIAGNOSIS — C779 Secondary and unspecified malignant neoplasm of lymph node, unspecified: Secondary | ICD-10-CM | POA: Insufficient documentation

## 2023-01-14 DIAGNOSIS — I2510 Atherosclerotic heart disease of native coronary artery without angina pectoris: Secondary | ICD-10-CM | POA: Diagnosis not present

## 2023-01-14 DIAGNOSIS — Z85118 Personal history of other malignant neoplasm of bronchus and lung: Secondary | ICD-10-CM | POA: Diagnosis not present

## 2023-01-14 DIAGNOSIS — C770 Secondary and unspecified malignant neoplasm of lymph nodes of head, face and neck: Secondary | ICD-10-CM | POA: Insufficient documentation

## 2023-01-14 DIAGNOSIS — C7951 Secondary malignant neoplasm of bone: Secondary | ICD-10-CM | POA: Insufficient documentation

## 2023-01-14 DIAGNOSIS — L918 Other hypertrophic disorders of the skin: Secondary | ICD-10-CM | POA: Diagnosis present

## 2023-01-14 DIAGNOSIS — R918 Other nonspecific abnormal finding of lung field: Secondary | ICD-10-CM

## 2023-01-14 DIAGNOSIS — Z794 Long term (current) use of insulin: Secondary | ICD-10-CM | POA: Diagnosis not present

## 2023-01-14 LAB — GLUCOSE, CAPILLARY: Glucose-Capillary: 98 mg/dL (ref 70–99)

## 2023-01-14 MED ORDER — LIDOCAINE HCL (PF) 1 % IJ SOLN
5.0000 mL | Freq: Once | INTRAMUSCULAR | Status: AC
  Administered 2023-01-14: 5 mL via INTRADERMAL

## 2023-01-14 MED ORDER — MIDAZOLAM HCL 2 MG/2ML IJ SOLN
INTRAMUSCULAR | Status: AC | PRN
  Administered 2023-01-14: 1 mg via INTRAVENOUS

## 2023-01-14 MED ORDER — MIDAZOLAM HCL 2 MG/2ML IJ SOLN
INTRAMUSCULAR | Status: AC
  Filled 2023-01-14: qty 2

## 2023-01-14 MED ORDER — FENTANYL CITRATE (PF) 100 MCG/2ML IJ SOLN
INTRAMUSCULAR | Status: AC | PRN
  Administered 2023-01-14: 50 ug via INTRAVENOUS

## 2023-01-14 MED ORDER — FENTANYL CITRATE (PF) 100 MCG/2ML IJ SOLN
INTRAMUSCULAR | Status: AC
  Filled 2023-01-14: qty 2

## 2023-01-14 NOTE — Procedures (Signed)
Interventional Radiology Procedure Note  Procedure: Ultrasound guided right supraclavicular lymph node biopsy   Findings: Please refer to procedural dictation for full description. 18 ga core x2.  Complications: None immediate  Estimated Blood Loss: < 5 mL  Recommendations: 1 hour bedrest. Follow Pathology results.   Marliss Coots, MD

## 2023-01-14 NOTE — Progress Notes (Signed)
Patient and wife was given discharge instructions. Both verbalized understanding. 

## 2023-01-14 NOTE — H&P (Signed)
Chief Complaint: Patient was seen in consultation today for LN biopsy   Referring Physician(s): Olalere,Adewale A  Supervising Physician: Marliss Coots  Patient Status: [facility] - Out-pt  History of Present Illness: Jeffery Fernandez is a 77 y.o. male with lung cancer. Hypermetabolic adenopathy found on PET scan.  Pt referred for biopsy of supraclavicular LN.  PMHx, meds, labs, imaging, allergies reviewed. Feels well, no recent fevers, chills, illness. Has been NPO today as directed. Family at bedside.   Past Medical History:  Diagnosis Date   Arrhythmia    Arthritis    Cataract    bilateral cataract extraction with intraoccular lens implants   Chest pain, unspecified    Chronic airway obstruction, not elsewhere classified    PT. DENIES HE HAS COPD   CKD (chronic kidney disease)    Colon polyps    Adenomatous Polyps 2007   Depression    Diabetes mellitus    Diverticulosis    Gout    Heart murmur    History of kidney stones    Hyperlipemia    Hypertension    Nephrolithiasis    Normocytic anemia 04/23/2017   Obstructive sleep apnea (adult) (pediatric)    Other and unspecified hyperlipidemia    Other left bundle branch block    Retinal vascular occlusion, unspecified    right   Shortness of breath    on exertion   Unspecified sleep apnea    Vision loss of right eye    White coat syndrome with diagnosis of hypertension 09/19/2017    Past Surgical History:  Procedure Laterality Date   ANTERIOR CERVICAL DECOMP/DISCECTOMY FUSION N/A 06/26/2016   Procedure: Re-Exploration of Cervical Wound;  Surgeon: Donalee Citrin, MD;  Location: [facility] OR;  Service: Neurosurgery;  Laterality: N/A;   ANTERIOR CERVICAL DECOMP/DISCECTOMY FUSION N/A 06/26/2016   Procedure: ANTERIOR CERVICAL DECOMPRESSION/DISCECTOMY FUSION, INTERBODY PROSTESIS, PLATE, CERVICAL THREE CERVICAL FOUR, CERVICAL FOUR CERVICAL FIVE CERVICAL SIX;  Surgeon: Tressie Stalker, MD;  Location: [facility] OR;  Service: Neurosurgery;   Laterality: N/A;   APPLICATION OF WOUND VAC  06/05/2020   Procedure: APPLICATION OF WOUND VAC;  Surgeon: Tressie Stalker, MD;  Location: [facility] OR;  Service: Neurosurgery;;   BACK SURGERY     CATARACT EXTRACTION Bilateral    KIDNEY STONE SURGERY     KYPHOPLASTY N/A 01/19/2018   Procedure: KYPHOPLASTY LUMBAR THREE;  Surgeon: Tressie Stalker, MD;  Location: [facility] OR;  Service: Neurosurgery;  Laterality: N/A;  KYPHOPLASTY LUMBAR THREE   LUMBAR WOUND DEBRIDEMENT N/A 06/05/2020   Procedure: LUMBAR WOUND DEBRIDEMENT;  Surgeon: Tressie Stalker, MD;  Location: [facility] OR;  Service: Neurosurgery;  Laterality: N/A;    Allergies: Amlodipine, Penicillins, and Hydralazine  Medications: Prior to Admission medications   Medication Sig Start Date End Date Taking? Authorizing Provider  acetaminophen (TYLENOL) 500 MG tablet Take 1,000 mg by mouth every 6 (six) hours as needed (for pain.).   Yes [provider]  aspirin EC 81 MG tablet Take 81 mg by mouth daily. Swallow whole.   Yes [provider]  atorvastatin (LIPITOR) 40 MG tablet Take 40 mg by mouth at bedtime.   Yes [provider]  buPROPion (WELLBUTRIN XL) 150 MG 24 hr tablet Take 150 mg by mouth every morning.   Yes [provider]  busPIRone (BUSPAR) 10 MG tablet Take 10 mg by mouth in the morning. 12/11/22  Yes [provider]  gabapentin (NEURONTIN) 100 MG capsule Take 1 capsule (100 mg total) by mouth daily. 01/02/23  Yes  Laretta Bolster, MD  insulin glargine (LANTUS) 100 UNIT/ML injection Inject 40 Units into the skin in the morning.   Yes [provider]  sertraline (ZOLOFT) 100 MG tablet Take 100 mg by mouth in the morning.   Yes [provider]  torsemide (DEMADEX) 20 MG tablet Take 2 tablets (40 mg total) by mouth daily. Patient taking differently: Take 40 mg by mouth in the morning. 12/04/22 01/14/23 Yes [name], MD  Dulaglutide (TRULICITY) 1.5 MG/0.5ML SOAJ Inject 1.5 mg into  the skin daily. Takes on Thursdays 10/01/22   [provider]  Turmeric (QC TUMERIC COMPLEX) 500 MG CAPS Take 2,500 mg by mouth in the morning and at bedtime.    [provider]     Family History  Problem Relation Age of Onset   Cancer Mother    Uterine cancer Mother    Heart disease Father    Stroke Father    Cancer Sister        Lung cancer   Heart attack Brother 60       heart attack and CHF/ also some form of EP ablation   Parkinson's disease Brother    Atrial fibrillation Brother    Arthritis/Rheumatoid Cousin    Diabetes Other    Colon cancer Neg Hx    Stomach cancer Neg Hx    Liver cancer Neg Hx    Rectal cancer Neg Hx    Esophageal cancer Neg Hx     Social History   Socioeconomic History   Marital status: Married    Spouse name: Ann   Number of children: 1   Years of education: Not on file   Highest education level: Not on file  Occupational History   Occupation: Retired    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 Nadeen Landau from [place] and moved back there after retirement. He  Has a daughter living in [place]. Quit smoking around 1988. Married   Right-handed   Caffeine: soda daily, decaf coffee   Social Drivers of Corporate investment banker Strain: Low Risk  (12/08/2022)   Received from [facility]   Overall Financial Resource Strain (CARDIA)     Difficulty of Paying Living Expenses: Not hard at all  Food Insecurity: No Food Insecurity (01/09/2023)   Hunger Vital Sign    Worried About Running Out of Food in the Last Year: Never true    Ran Out of Food in the Last Year: Never true  Transportation Needs: No Transportation Needs (01/09/2023)   PRAPARE - Administrator, Civil Service (Medical): No    Lack of Transportation (Non-Medical): No  Physical Activity: Unknown (12/08/2022)   Received from [facility]   Exercise Vital Sign    Days  of Exercise per Week: 0 days    Minutes of Exercise per Session: Not on file  Stress: Stress Concern Present (12/08/2022)   Received from [facility] of Occupational Health - Occupational Stress Questionnaire    Feeling of Stress : To some extent  Social Connections: Socially Integrated (12/08/2022)   Received from [facility]   Social Network    How would you rate your social network (family, work, friends)?: Good participation with social networks    Review of Systems: A 12 point ROS discussed and pertinent positives are indicated in the HPI above.  All other systems are negative.  Review of Systems  Vital Signs: BP (!) 150/97   Pulse 93   Temp 98.2 F (36.8 C) (Oral)   Resp 16   Ht 5\' 9"  (1.753 m)   Wt 194 lb (88 kg)   SpO2 97%   BMI 28.65 kg/m   Physical Exam Constitutional:      Appearance: He is not ill-appearing.  HENT:     Mouth/Throat:     Mouth: Mucous membranes are moist.     Pharynx: Oropharynx is clear.  Cardiovascular:     Rate and Rhythm: Normal rate and regular rhythm.     Heart sounds: Normal heart sounds.  Pulmonary:     Effort: Pulmonary effort is normal. No respiratory distress.     Breath sounds: Normal breath sounds.  Neurological:     General: No focal deficit present.     Mental Status: He is alert and oriented to person, place, and time.  Psychiatric:        Mood and Affect: Mood normal.        Thought  Content: Thought content normal.     Imaging: VAS [place] LOWER EXTREMITY VENOUS (DVT) (7a-7p) Result Date: 01/01/2023  Lower Venous DVT Study Patient Name:  Jeffery Fernandez  Date of Exam:   12/31/2022 Medical Rec #: [id number]      Accession #:    [id number] Date of Birth: 22-Aug-1945       Patient Gender: M Patient Age:   88 years Exam Location:  [facility] Procedure:      VAS [place] LOWER EXTREMITY VENOUS (DVT) Referring Phys: [facility] MEREDITH --------------------------------------------------------------------------------  Indications: Pain.  Limitations: Patient movement due to pain. Comparison Study: No prior exam. Performing Technologist: Fernande Bras  Examination Guidelines: A complete evaluation includes B-mode imaging, spectral Doppler, color Doppler, and power Doppler as needed of all accessible portions of each vessel. Bilateral testing is considered an integral part of a complete examination. Limited examinations for reoccurring indications may be performed as noted. The reflux portion of the exam is performed with the patient in reverse Trendelenburg.  +---------+---------------+---------+-----------+----------+----------------+ RIGHT    CompressibilityPhasicitySpontaneityPropertiesThrombus Aging   +---------+---------------+---------+-----------+----------+----------------+ CFV      Full           Yes      Yes                                   +---------+---------------+---------+-----------+----------+----------------+ SFJ      Full                                                          +---------+---------------+---------+-----------+----------+----------------+  FV Prox  Full                                                          +---------+---------------+---------+-----------+----------+----------------+ FV Mid   Full                                                          +---------+---------------+---------+-----------+----------+----------------+  FV DistalFull                                                          +---------+---------------+---------+-----------+----------+----------------+ PFV      Full                                                          +---------+---------------+---------+-----------+----------+----------------+ POP      Full           Yes      Yes                                   +---------+---------------+---------+-----------+----------+----------------+ PTV      Full                                                          +---------+---------------+---------+-----------+----------+----------------+ PERO                                                  Patent by color. +---------+---------------+---------+-----------+----------+----------------+   +----+---------------+---------+-----------+----------+--------------+ LEFTCompressibilityPhasicitySpontaneityPropertiesThrombus Aging +----+---------------+---------+-----------+----------+--------------+ CFV Full           Yes      Yes                                 +----+---------------+---------+-----------+----------+--------------+     Summary: RIGHT: - There is no evidence of deep vein thrombosis in the lower extremity.  - No cystic structure found in the popliteal fossa.  LEFT: - No evidence of common femoral vein obstruction.   *See table(s) above for measurements and observations. Electronically signed by Coral Else MD on 01/01/2023 at 7:13:05 PM.    Final    DG Abd 1 View Result Date: 01/01/2023 CLINICAL DATA:  Abdominal pain. EXAM: ABDOMEN - 1 VIEW COMPARISON:  PET-CT, 12/16/2022. FINDINGS: Normal bowel gas pattern. 1.1 cm calculus projects in the right kidney, stable from the prior PET-CT. No left intrarenal stone or evidence of a  ureteral stone. Elevated right hemidiaphragm, also stable. No acute skeletal abnormality. Previous posterior lumbar spine fusion at L4-L5 and previous vertebroplasty at L3, L4 and L5.  IMPRESSION: 1. No acute findings. 2. Right intrarenal calculus. Electronically Signed   By: Amie Portland M.D.   On: 01/01/2023 10:29   DG Knee 1-2 Views Right Result Date: 12/31/2022 CLINICAL DATA:  Right hip pain and right leg pain worse since yesterday. Difficulty walking. EXAM: RIGHT KNEE - 1-2 VIEW COMPARISON:  None Available. FINDINGS: Mild degenerative changes in the right knee with mild medial compartment narrowing. No evidence of acute fracture or dislocation. No focal bone lesion or bone destruction. No significant effusions. Vascular calcifications in the soft tissues. IMPRESSION: Mild degenerative changes in the right knee with medial compartment narrowing. No acute bony abnormalities. Electronically Signed   By: Burman Nieves M.D.   On: 12/31/2022 22:31   DG HIP UNILAT WITH PELVIS 1V RIGHT Result Date: 12/31/2022 CLINICAL DATA:  Right hip pain with difficulty walking. History of lung cancer. EXAM: DG HIP (WITH OR WITHOUT PELVIS) 1V RIGHT COMPARISON:  PET-CT 12/16/2022 FINDINGS: Mild degenerative changes in the right hip with narrowing and sclerosis of the superior acetabulum and small osteophyte formation. The hip is rotated, limiting evaluation of the femoral neck but no acute displaced fractures are identified as visualized. No focal bone lesion or bone destruction. Pelvis and left hip appear intact. SI joints and symphysis pubis are not displaced. Postoperative changes in the lower lumbar spine. Vascular calcifications. IMPRESSION: Mild degenerative changes in the right hip. No acute displaced fractures are identified. Electronically Signed   By: Burman Nieves M.D.   On: 12/31/2022 22:30   CT Head Wo Contrast Result Date: 12/31/2022 CLINICAL DATA:  Mental status change EXAM: CT HEAD WITHOUT CONTRAST TECHNIQUE: Contiguous axial images were obtained from the base of the skull through the vertex without intravenous contrast. RADIATION DOSE REDUCTION: This exam was performed according to  the departmental dose-optimization program which includes automated exposure control, adjustment of the mA and/or kV according to patient size and/or use of iterative reconstruction technique. COMPARISON:  05/13/2020 CT head FINDINGS: Brain: No evidence of acute infarction, hemorrhage, mass, mass effect, or midline shift. No hydrocephalus or extra-axial fluid collection. Periventricular white matter changes, likely the sequela of chronic small vessel ischemic disease. Mildly advanced cerebral atrophy for age. Basal ganglia calcifications. Vascular: No hyperdense vessel. Atherosclerotic calcifications in the intracranial carotid and vertebral arteries. Skull: Negative for fracture or focal lesion. Sinuses/Orbits: No acute finding. Other: The mastoid air cells are well aerated. IMPRESSION: No acute intracranial process. Electronically Signed   By: Wiliam Ke M.D.   On: 12/31/2022 19:42   DG Chest Portable 1 View Result Date: 12/31/2022 CLINICAL DATA:  Altered mental status, shortness of breath. EXAM: PORTABLE CHEST 1 VIEW COMPARISON:  December 01, 2022. FINDINGS: Stable cardiomegaly. Left lung is clear. Minimal right basilar subsegmental atelectasis is noted with small pleural effusion. Bony thorax is unremarkable. IMPRESSION: Minimal right basilar subsegmental atelectasis with small right pleural effusion. Electronically Signed   By: Lupita Raider M.D.   On: 12/31/2022 19:37   NM PET Image Initial (PI) Skull Base To Thigh Result Date: 12/23/2022 CLINICAL DATA:  Initial treatment strategy for right upper lobe lung mass on chest CT. EXAM: NUCLEAR MEDICINE PET SKULL BASE TO THIGH TECHNIQUE: 9.3 mCi F-18 FDG was injected intravenously. Full-ring PET imaging was performed from the skull base to thigh after the radiotracer. CT data was obtained and used for  attenuation correction and anatomic localization. Fasting blood glucose: 87 mg/dl COMPARISON:  Chest CT 72/53/6644. FINDINGS: Mediastinal blood pool  activity: SUV max 2.6 Liver activity: SUV max NA NECK: Left posterior triangle node measures 5 mm and a S.U.V. max of 4.7 on 36/4. Right low jugular/supraclavicular node measures 11 mm and a S.U.V. max of 10.8 on 47/4. Incidental CT findings: Bilateral carotid atherosclerosis. CHEST: The right apical lung mass is hypermetabolic at 3.8 cm and a S.U.V. max of 27.2 on 55/4. A satellite more peripheral right apical nodule measures 7 mm and a S.U.V. max of 11.8 on the same image. More inferior right upper lobe pulmonary nodule measures 2.3 cm and a S.U.V. max of 26.7 on 63/4. There is no lingular nodule. The inferior right upper lobe nodule was likely inadvertently labeled as lingular on the prior CT. Mediastinal nodal metastasis, including a right paratracheal nodal mass of 1.7 cm and a S.U.V. max of 20.4 on 68/4. Right lower lobe ground-glass opacity and mild hypermetabolism on 84/4, likely related to the known interstitial lung disease versus less likely superimposed pneumonia. Incidental CT findings: Deferred to recent diagnostic CT. Aortic and coronary artery calcification. Mild cardiomegaly. ABDOMEN/PELVIS: No abdominopelvic parenchymal hypermetabolism. A left medial inguinal node versus subcutaneous nodule measures 1.0 cm and a S.U.V. max of 19.1 on 190/4. Incidental CT findings: Mild right adrenal thickening. Normal left adrenal gland. Mild renal cortical thinning bilaterally. Right renal collecting system 11 mm calculus. Abdominal aortic atherosclerosis. Scattered colonic diverticula. SKELETON: Eccentric left anterior T10 CT occult lesion at a S.U.V. max of 17.0. Incidental CT findings: Lumbar spine fixation. Prior vertebral augmentation at L 3 through L5. IMPRESSION: 1. Right apical primary bronchogenic carcinoma with cervicothoracic nodal and osseous metastasis as detailed above. 2. Smaller hypermetabolic right upper lobe nodules could represent metachronous primaries or pulmonary metastasis. 3. Medial left  inguinal node versus subcutaneous nodule is hypermetabolic and indeterminate for an atypical distribution of metastatic disease versus reactive etiology. 4. Incidental findings, including: Interstitial lung disease as detailed on dedicated chest CT. Coronary artery atherosclerosis. Aortic Atherosclerosis (ICD10-I70.0). Right nephrolithiasis. Electronically Signed   By: Jeronimo Greaves M.D.   On: 12/23/2022 10:39    Labs:  CBC: Recent Labs    12/31/22 1446 12/31/22 1451 12/31/22 2020 12/31/22 2338 01/01/23 0042 01/02/23 0744  WBC 19.3*  --   --  15.0* 15.4* 13.9*  HGB 11.3*   < > 11.2* 10.3* 10.5* 10.5*  HCT 35.5*   < > 33.0* 31.8* 32.8* 33.2*  PLT 278  --   --  209 211 229   < > = values in this interval not displayed.    COAGS: Recent Labs    12/31/22 1745  INR 1.2  APTT 32    BMP: Recent Labs    12/03/22 0245 12/31/22 1446 12/31/22 1451 12/31/22 2020 12/31/22 2338 01/01/23 0042 01/02/23 0744  NA 139 139 142 143  --  142 139  K 4.6 3.1* 3.2* 3.2*  --  3.4* 3.4*  CL 103 100 101 102  --  103 99  CO2 26 25  --   --   --  27 27  GLUCOSE 173* 137* 145* 185*  --  164* 155*  BUN 40* 30* 30* 33*  --  33* 47*  CALCIUM 8.8* 9.1  --   --   --  8.8* 8.9  CREATININE 3.33* 3.32* 3.50* 4.10* 3.71* 3.65* 4.04*  GFRNONAA 18* 18*  --   --  16* 16* 14*  LIVER FUNCTION TESTS: Recent Labs    07/03/22 1356 11/25/22 0222 11/25/22 1230 11/28/22 0236 11/29/22 0248 12/01/22 0234 12/02/22 0217 12/03/22 0245 12/31/22 1745  BILITOT 0.3 0.5  --  0.6  --   --   --   --  0.7  AST 15 10*  --  13*  --   --   --   --  15  ALT  --  9  --  12  --   --   --   --  12  ALKPHOS 147* 87  --  80  --   --   --   --  94  PROT 7.4 6.5  --  6.6  --   --   --   --  8.2*  ALBUMIN 4.1 2.5*   < > 2.9*   < > 2.9* 2.8* 2.8* 3.2*   < > = values in this interval not displayed.     Assessment and Plan: Lung cancer Hypermetabolic (R)supraclavicular LN on PET Plan for biopsy Risks and benefits of  LN bx was discussed with the patient and/or patient's family including, but not limited to bleeding, infection, damage to adjacent structures or low yield requiring additional tests.  All of the questions were answered and there is agreement to proceed.  Consent signed and in chart.    Electronically Signed: Brayton El, PA-C 01/14/2023, 8:01 AM   I spent a total of 30 minutes in face to face in clinical consultation, greater than 50% of which was counseling/coordinating care for LN bx

## 2023-01-15 LAB — SURGICAL PATHOLOGY

## 2023-01-16 ENCOUNTER — Encounter: Payer: Self-pay | Admitting: Emergency Medicine

## 2023-01-16 NOTE — Telephone Encounter (Signed)
Dr. Delton Coombes, The patient's daughter has seen the pathology results and would like to receive a call to discuss when you have a chance.  Please see the mychart message for her phone number.  Thank you.

## 2023-01-16 NOTE — Telephone Encounter (Signed)
I reviewed the biopsy findings with the patient's daughter by phone.  Shows a poorly differentiated adenocarcinoma, likely lung primary.  IHC was not performed because we wanted to save any extra material in order to facilitate molecular studies if desired by Dr. Arbutus Ped. He has follow-up with Dr. Roselind Messier and Dr. Arbutus Ped arranged.

## 2023-01-17 ENCOUNTER — Ambulatory Visit
Admission: RE | Admit: 2023-01-17 | Discharge: 2023-01-17 | Disposition: A | Discharge status: Home / Self Care | Payer: HMO | Source: Ambulatory Visit | Attending: Radiation Oncology | Admitting: Radiation Oncology

## 2023-01-17 VITALS — BP 135/87 | HR 98 | Temp 97.7°F | Resp 20

## 2023-01-17 DIAGNOSIS — Z87891 Personal history of nicotine dependence: Secondary | ICD-10-CM | POA: Insufficient documentation

## 2023-01-17 DIAGNOSIS — Z79899 Other long term (current) drug therapy: Secondary | ICD-10-CM | POA: Insufficient documentation

## 2023-01-17 DIAGNOSIS — C3411 Malignant neoplasm of upper lobe, right bronchus or lung: Secondary | ICD-10-CM | POA: Insufficient documentation

## 2023-01-17 DIAGNOSIS — Z794 Long term (current) use of insulin: Secondary | ICD-10-CM | POA: Insufficient documentation

## 2023-01-17 DIAGNOSIS — N184 Chronic kidney disease, stage 4 (severe): Secondary | ICD-10-CM | POA: Diagnosis not present

## 2023-01-17 DIAGNOSIS — C7951 Secondary malignant neoplasm of bone: Secondary | ICD-10-CM | POA: Insufficient documentation

## 2023-01-17 DIAGNOSIS — Z510 Encounter for antineoplastic radiation therapy: Secondary | ICD-10-CM | POA: Insufficient documentation

## 2023-01-17 DIAGNOSIS — I1310 Hypertensive heart and chronic kidney disease without heart failure, with stage 1 through stage 4 chronic kidney disease, or unspecified chronic kidney disease: Secondary | ICD-10-CM | POA: Diagnosis not present

## 2023-01-17 DIAGNOSIS — E1122 Type 2 diabetes mellitus with diabetic chronic kidney disease: Secondary | ICD-10-CM | POA: Insufficient documentation

## 2023-01-25 DIAGNOSIS — Z510 Encounter for antineoplastic radiation therapy: Secondary | ICD-10-CM | POA: Diagnosis not present

## 2023-01-27 ENCOUNTER — Ambulatory Visit
Admission: RE | Admit: 2023-01-27 | Discharge: 2023-01-27 | Disposition: A | Discharge status: Home / Self Care | Payer: HMO | Source: Ambulatory Visit | Attending: Radiation Oncology | Admitting: Radiation Oncology

## 2023-01-27 ENCOUNTER — Other Ambulatory Visit: Payer: Self-pay

## 2023-01-27 ENCOUNTER — Other Ambulatory Visit: Payer: Self-pay | Admitting: Physician Assistant

## 2023-01-27 ENCOUNTER — Encounter (HOSPITAL_COMMUNITY): Payer: Self-pay

## 2023-01-27 DIAGNOSIS — C3411 Malignant neoplasm of upper lobe, right bronchus or lung: Secondary | ICD-10-CM

## 2023-01-27 DIAGNOSIS — Z510 Encounter for antineoplastic radiation therapy: Secondary | ICD-10-CM | POA: Diagnosis not present

## 2023-01-27 LAB — RAD ONC ARIA SESSION SUMMARY
Course Elapsed Days: 0
Plan Fractions Treated to Date: 1
Plan Prescribed Dose Per Fraction: 3 Gy
Plan Total Fractions Prescribed: 10
Plan Total Prescribed Dose: 30 Gy
Reference Point Dosage Given to Date: 3 Gy
Reference Point Session Dosage Given: 3 Gy
Session Number: 1

## 2023-01-28 ENCOUNTER — Inpatient Hospital Stay: Payer: HMO

## 2023-01-28 ENCOUNTER — Other Ambulatory Visit: Payer: Self-pay

## 2023-01-28 ENCOUNTER — Ambulatory Visit
Admission: RE | Admit: 2023-01-28 | Discharge: 2023-01-28 | Disposition: A | Discharge status: Home / Self Care | Payer: HMO | Source: Ambulatory Visit | Attending: Radiation Oncology | Admitting: Radiation Oncology

## 2023-01-28 ENCOUNTER — Ambulatory Visit: Payer: HMO

## 2023-01-28 ENCOUNTER — Inpatient Hospital Stay (HOSPITAL_BASED_OUTPATIENT_CLINIC_OR_DEPARTMENT_OTHER): Payer: HMO | Admitting: Internal Medicine

## 2023-01-28 VITALS — BP 132/72 | HR 101 | Temp 98.5°F | Resp 17 | Ht 69.0 in | Wt 182.6 lb

## 2023-01-28 DIAGNOSIS — Z87891 Personal history of nicotine dependence: Secondary | ICD-10-CM | POA: Insufficient documentation

## 2023-01-28 DIAGNOSIS — C3411 Malignant neoplasm of upper lobe, right bronchus or lung: Secondary | ICD-10-CM | POA: Insufficient documentation

## 2023-01-28 DIAGNOSIS — C7951 Secondary malignant neoplasm of bone: Secondary | ICD-10-CM | POA: Insufficient documentation

## 2023-01-28 DIAGNOSIS — C3491 Malignant neoplasm of unspecified part of right bronchus or lung: Secondary | ICD-10-CM

## 2023-01-28 DIAGNOSIS — I1310 Hypertensive heart and chronic kidney disease without heart failure, with stage 1 through stage 4 chronic kidney disease, or unspecified chronic kidney disease: Secondary | ICD-10-CM | POA: Insufficient documentation

## 2023-01-28 DIAGNOSIS — N184 Chronic kidney disease, stage 4 (severe): Secondary | ICD-10-CM

## 2023-01-28 DIAGNOSIS — Z510 Encounter for antineoplastic radiation therapy: Secondary | ICD-10-CM | POA: Diagnosis not present

## 2023-01-28 DIAGNOSIS — Z794 Long term (current) use of insulin: Secondary | ICD-10-CM | POA: Insufficient documentation

## 2023-01-28 DIAGNOSIS — E1122 Type 2 diabetes mellitus with diabetic chronic kidney disease: Secondary | ICD-10-CM | POA: Insufficient documentation

## 2023-01-28 DIAGNOSIS — Z79899 Other long term (current) drug therapy: Secondary | ICD-10-CM

## 2023-01-28 LAB — CBC WITH DIFFERENTIAL (CANCER CENTER ONLY)
Abs Immature Granulocytes: 0.03 10*3/uL (ref 0.00–0.07)
Basophils Absolute: 0.1 10*3/uL (ref 0.0–0.1)
Basophils Relative: 1 %
Eosinophils Absolute: 0.1 10*3/uL (ref 0.0–0.5)
Eosinophils Relative: 1 %
HCT: 32.4 % — ABNORMAL LOW (ref 39.0–52.0)
Hemoglobin: 10.5 g/dL — ABNORMAL LOW (ref 13.0–17.0)
Immature Granulocytes: 0 %
Lymphocytes Relative: 21 %
Lymphs Abs: 2 10*3/uL (ref 0.7–4.0)
MCH: 29.2 pg (ref 26.0–34.0)
MCHC: 32.4 g/dL (ref 30.0–36.0)
MCV: 90.3 fL (ref 80.0–100.0)
Monocytes Absolute: 0.7 10*3/uL (ref 0.1–1.0)
Monocytes Relative: 7 %
Neutro Abs: 6.5 10*3/uL (ref 1.7–7.7)
Neutrophils Relative %: 70 %
Platelet Count: 235 10*3/uL (ref 150–400)
RBC: 3.59 MIL/uL — ABNORMAL LOW (ref 4.22–5.81)
RDW: 14.1 % (ref 11.5–15.5)
WBC Count: 9.4 10*3/uL (ref 4.0–10.5)
nRBC: 0 % (ref 0.0–0.2)

## 2023-01-28 LAB — CMP (CANCER CENTER ONLY)
ALT: 7 U/L (ref 0–44)
AST: 10 U/L — ABNORMAL LOW (ref 15–41)
Albumin: 3.8 g/dL (ref 3.5–5.0)
Alkaline Phosphatase: 111 U/L (ref 38–126)
Anion gap: 10 (ref 5–15)
BUN: 28 mg/dL — ABNORMAL HIGH (ref 8–23)
CO2: 29 mmol/L (ref 22–32)
Calcium: 9.5 mg/dL (ref 8.9–10.3)
Chloride: 102 mmol/L (ref 98–111)
Creatinine: 3.17 mg/dL — ABNORMAL HIGH (ref 0.61–1.24)
GFR, Estimated: 19 mL/min — ABNORMAL LOW (ref >60)
Glucose, Bld: 106 mg/dL — ABNORMAL HIGH (ref 70–99)
Potassium: 4.2 mmol/L (ref 3.5–5.1)
Sodium: 141 mmol/L (ref 135–145)
Total Bilirubin: 0.4 mg/dL (ref 0.0–1.2)
Total Protein: 8.3 g/dL — ABNORMAL HIGH (ref 6.5–8.1)

## 2023-01-28 LAB — RAD ONC ARIA SESSION SUMMARY
Course Elapsed Days: 1
Plan Fractions Treated to Date: 2
Plan Prescribed Dose Per Fraction: 3 Gy
Plan Total Fractions Prescribed: 10
Plan Total Prescribed Dose: 30 Gy
Reference Point Dosage Given to Date: 6 Gy
Reference Point Session Dosage Given: 3 Gy
Session Number: 2

## 2023-01-28 NOTE — Progress Notes (Signed)
 [facility] [phone]   [phone]  CONSULT NOTE  REFERRING PHYSICIAN: Dr. Lamar Chris  REASON FOR CONSULTATION:  77 years old white male recently diagnosed with lung cancer.  HPI Jeffery Fernandez is a 77 y.o. male came to the clinic today for initial evaluation of his recently diagnosed lung cancer accompanied by his wife and daughter Verneita. Discussed the use of AI scribe software for clinical note transcription with the patient, who gave verbal consent to proceed.  History of Present Illness   The patient, a 77 year old individual with a recent diagnosis of lung cancer, presents for an initial consultation. The diagnosis was made following the onset of shortness of breath in late October to early November. The patient's condition deteriorated rapidly, with oxygen  levels dropping to the low eighties, prompting an emergency room visit. Initial suspicions of pneumonia were dismissed following a chest x-ray on October 30th, which revealed pulmonary congestion and a potential diagnosis of congestive heart failure. A subsequent high-resolution CT scan of the chest revealed a right upper lobe mass measuring 4.1 by 2.9 by 2.2 cm, right hilar lymph nodes, mediastinal lymph nodes, and a solid nodule in the lingula on the left side.  A PET scan performed on November 18th confirmed the presence of the right apical mass, lymph nodes in the right neck area, and the middle of the chest. 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 A biopsy of the right subarachnoid clavicular lymph nodes was performed, and radiation therapy was initiated for back pain, potentially related to the T10 metastasis.  The patient reports feeling generally well, with the primary complaints being back pain and occasional leg weakness. The patient uses oxygen  intermittently, at a rate of 1.5 liters,  primarily when moving around or sleeping. The patient denies experiencing chest pain, coughing blood, nausea, vomiting, diarrhea, and headaches. However, constipation was reported, potentially related to pain medication use.  The patient has a significant smoking history, having started at around [age] or [age] years old, but quit 37 years ago. The patient's medical history includes arthritis, arrhythmia, COPD, coronary kidney disease, depression, diabetes, gout, high blood pressure, diverticulosis, and high cholesterol. The patient's family history is significant for uterine cancer in the mother, heart disease and stroke in the father, and lung cancer in a sister. The patient's occupational history includes working at [employer] for 17 years and later as a oceanographer in houses.      HPI  Past Medical History:  Diagnosis Date   Arrhythmia    Arthritis    Cataract    bilateral cataract extraction with intraoccular lens implants   Chest pain, unspecified    Chronic airway obstruction, not elsewhere classified    PT. DENIES HE HAS COPD   CKD (chronic kidney disease)    Colon polyps    Adenomatous Polyps 2007   Depression    Diabetes mellitus    Diverticulosis    Gout    Heart murmur    History of kidney stones    Hyperlipemia    Hypertension    Nephrolithiasis    Normocytic anemia 04/23/2017   Obstructive sleep apnea (adult) (pediatric)    Other and unspecified hyperlipidemia    Other left bundle branch block    Retinal vascular occlusion, unspecified    right   Shortness of breath    on exertion   Unspecified  sleep apnea    Vision loss of right eye    White coat syndrome with diagnosis of hypertension 09/19/2017    Past Surgical History:  Procedure Laterality Date   ANTERIOR CERVICAL DECOMP/DISCECTOMY FUSION N/A 06/26/2016   Procedure: Re-Exploration of Cervical Wound;  Surgeon: Onetha Kuba, MD;  Location: [facility] OR;  Service: Neurosurgery;  Laterality: N/A;    ANTERIOR CERVICAL DECOMP/DISCECTOMY FUSION N/A 06/26/2016   Procedure: ANTERIOR CERVICAL DECOMPRESSION/DISCECTOMY FUSION, INTERBODY PROSTESIS, PLATE, CERVICAL THREE CERVICAL FOUR, CERVICAL FOUR CERVICAL FIVE CERVICAL SIX;  Surgeon: Mavis Purchase, MD;  Location: [facility] OR;  Service: Neurosurgery;  Laterality: N/A;   APPLICATION OF WOUND VAC  06/05/2020   Procedure: APPLICATION OF WOUND VAC;  Surgeon: Mavis Purchase, MD;  Location: [facility] OR;  Service: Neurosurgery;;   BACK SURGERY     CATARACT EXTRACTION Bilateral    KIDNEY STONE SURGERY     KYPHOPLASTY N/A 01/19/2018   Procedure: KYPHOPLASTY LUMBAR THREE;  Surgeon: Mavis Purchase, MD;  Location: [facility] OR;  Service: Neurosurgery;  Laterality: N/A;  KYPHOPLASTY LUMBAR THREE   LUMBAR WOUND DEBRIDEMENT N/A 06/05/2020   Procedure: LUMBAR WOUND DEBRIDEMENT;  Surgeon: Mavis Purchase, MD;  Location: [facility] OR;  Service: Neurosurgery;  Laterality: N/A;    Family History  Problem Relation Age of Onset   Cancer Mother    Uterine cancer Mother    Heart disease Father    Stroke Father    Cancer Sister        Lung cancer   Heart attack Brother 60       heart attack and CHF/ also some form of EP ablation   Parkinson's disease Brother    Atrial fibrillation Brother    Arthritis/Rheumatoid Cousin    Diabetes Other    Colon cancer Neg Hx    Stomach cancer Neg Hx    Liver cancer Neg Hx    Rectal cancer Neg Hx    Esophageal cancer Neg Hx     Social History Social History   Tobacco Use   Smoking status: Former    Current packs/day: 0.00    Average packs/day: 1.5 packs/day for 25.0 years (37.5 ttl pk-yrs)    Types: Cigarettes    Start date: 01/28/1961    Quit date: 01/28/1986    Years since quitting: 37.0   Smokeless tobacco: Never  Vaping Use   Vaping status: Never Used  Substance Use Topics   Alcohol use: Not Currently    Comment: Very rare   Drug use: No    Allergies  Allergen Reactions   Amlodipine  Swelling    Patient report facial swelling  at high doses   Penicillins Other (See Comments)    Possibly rash - not sure Has patient had a PCN reaction causing immediate rash, facial/tongue/throat swelling, SOB or lightheadedness with hypotension:unknown Has patient had a PCN reaction causing severe rash involving mucus membranes or skin necrosis: Unknown Has patient had a PCN reaction that required hospitalization: Unknown Has patient had a PCN reaction occurring within the last 10 years: No Childhood reaction If all of the above answers are NO, then may proceed with Cephalosporin use.    Hydralazine  Other (See Comments)    Dizziness    Current Outpatient Medications  Medication Sig Dispense Refill   acetaminophen  (TYLENOL ) 500 MG tablet Take 1,000 mg by mouth every 6 (six) hours as needed (for pain.).     aspirin  EC 81 MG tablet Take 81 mg by mouth daily. Swallow whole.     atorvastatin  (LIPITOR)  40 MG tablet Take 40 mg by mouth at bedtime.     buPROPion  (WELLBUTRIN  XL) 150 MG 24 hr tablet Take 150 mg by mouth every morning.     busPIRone (BUSPAR) 10 MG tablet Take 10 mg by mouth in the morning.     Dulaglutide  (TRULICITY ) 1.5 MG/0.5ML SOAJ Inject 1.5 mg into the skin daily. Takes on Thursdays     gabapentin  (NEURONTIN ) 100 MG capsule Take 1 capsule (100 mg total) by mouth daily.     insulin  glargine (LANTUS ) 100 UNIT/ML injection Inject 40 Units into the skin in the morning.     sertraline  (ZOLOFT ) 100 MG tablet Take 100 mg by mouth in the morning.     torsemide  (DEMADEX ) 20 MG tablet Take 2 tablets (40 mg total) by mouth daily. (Patient taking differently: Take 40 mg by mouth in the morning.) 60 tablet 0   Turmeric (QC TUMERIC COMPLEX) 500 MG CAPS Take 2,500 mg by mouth in the morning and at bedtime.     No current facility-administered medications for this visit.    Review of Systems  Constitutional: positive for fatigue Eyes: negative Ears, nose, mouth, throat, and face: negative Respiratory: positive for cough and  dyspnea on exertion Cardiovascular: negative Gastrointestinal: negative Genitourinary:negative Integument/breast: negative Hematologic/lymphatic: negative Musculoskeletal:positive for back pain Neurological: negative Behavioral/Psych: negative Endocrine: negative Allergic/Immunologic: negative  Physical Exam  MJO:jozmu, healthy, no distress, well nourished, and well developed SKIN: skin color, texture, turgor are normal, no rashes or significant lesions HEAD: Normocephalic, No masses, lesions, tenderness or abnormalities EYES: normal, PERRLA, Conjunctiva are pink and non-injected EARS: External ears normal, Canals clear OROPHARYNX:no exudate, no erythema, and lips, buccal mucosa, and tongue normal  NECK: supple, no adenopathy, no JVD LYMPH:  no palpable lymphadenopathy, no hepatosplenomegaly LUNGS: clear to auscultation , and palpation HEART: regular rate & rhythm, no murmurs, and no gallops ABDOMEN:abdomen soft, non-tender, normal bowel sounds, and no masses or organomegaly BACK: Back symmetric, no curvature., No CVA tenderness EXTREMITIES:no joint deformities, effusion, or inflammation, no edema  NEURO: alert & oriented x 3 with fluent speech, no focal motor/sensory deficits  PERFORMANCE STATUS: ECOG 1  LABORATORY DATA: Lab Results  Component Value Date   WBC 13.9 (H) 01/02/2023   HGB 10.5 (L) 01/02/2023   HCT 33.2 (L) 01/02/2023   MCV 92.2 01/02/2023   PLT 229 01/02/2023      Chemistry      Component Value Date/Time   NA 139 01/02/2023 0744   NA 142 07/03/2022 1356   K 3.4 (L) 01/02/2023 0744   CL 99 01/02/2023 0744   CO2 27 01/02/2023 0744   BUN 47 (H) 01/02/2023 0744   BUN 22 07/03/2022 1356   CREATININE 4.04 (H) 01/02/2023 0744      Component Value Date/Time   CALCIUM  8.9 01/02/2023 0744   ALKPHOS 94 12/31/2022 1745   AST 15 12/31/2022 1745   ALT 12 12/31/2022 1745   BILITOT 0.7 12/31/2022 1745   BILITOT 0.3 07/03/2022 1356       RADIOGRAPHIC  STUDIES: US  CORE BIOPSY (LYMPH NODES) Result Date: 01/14/2023 INDICATION: 77 year old male with history of right upper lobe lung mass and hypermetabolic right supraclavicular lymph node. EXAM: Ultrasound-guided right supraclavicular lymph node biopsy. MEDICATIONS: None. ANESTHESIA/SEDATION: Moderate (conscious) sedation was employed during this procedure. A total of Versed  1 mg and Fentanyl  50 mcg was administered intravenously. Moderate Sedation Time: 11 minutes. The patient's level of consciousness and vital signs were monitored continuously by radiology nursing throughout the procedure  under my direct supervision. FLUOROSCOPY TIME:  None. COMPLICATIONS: None immediate. PROCEDURE: Informed written consent was obtained from the patient after a thorough discussion of the procedural risks, benefits and alternatives. All questions were addressed. Maximal Sterile Barrier Technique was utilized including caps, mask, sterile gowns, sterile gloves, sterile drape, hand hygiene and skin antiseptic. A timeout was performed prior to the initiation of the procedure. Preprocedure ultrasound evaluation demonstrated an irregularly-shaped, homogeneously hypoechoic right supraclavicular lymph node measuring up to approximately 1.7 cm. The procedure was planned. Subdermal Local anesthesia was provided at the planned needle entry site with 1% lidocaine . A small skin nick was made. Under direct ultrasound visualization, deeper local anesthetic was administered to the periphery of the lymph node. Next, a 17 gauge coaxial introducer needle was directed to the periphery of the lymph node. This was followed by 2 core biopsies with an 18 gauge biopsy device. The samples were placed in formalin. The introducer needle was removed. Postprocedure ultrasound demonstrated no evidence of surrounding hematoma or other complicating features. The patient tolerated the procedure well and was transferred to the recovery area in good condition.  IMPRESSION: Technically successful ultrasound-guided right supraclavicular lymph node biopsy. Ester Sides, MD [facility] Electronically Signed   By: Ester Sides M.D.   On: 01/14/2023 11:07   VAS US  LOWER EXTREMITY VENOUS (DVT) (7a-7p) Result Date: 01/01/2023  Lower Venous DVT Study Patient Name:  SUMEET GETER  Date of Exam:   12/31/2022 Medical Rec #: [id number]      Accession #:    [id number] Date of Birth: 17-Sep-1945       Patient Gender: M Patient Age:   55 years Exam Location:  [facility] Procedure:      VAS US  LOWER EXTREMITY VENOUS (DVT) Referring Phys: [facility] MEREDITH --------------------------------------------------------------------------------  Indications: Pain.  Limitations: Patient movement due to pain. Comparison Study: No prior exam. Performing Technologist: Edilia Elden Appl  Examination Guidelines: A complete evaluation includes B-mode imaging, spectral Doppler, color Doppler, and power Doppler as needed of all accessible portions of each vessel. Bilateral testing is considered an integral part of a complete examination. Limited examinations for reoccurring indications may be performed as noted. The reflux portion of the exam is performed with the patient in reverse Trendelenburg.  +---------+---------------+---------+-----------+----------+----------------+ RIGHT    CompressibilityPhasicitySpontaneityPropertiesThrombus Aging   +---------+---------------+---------+-----------+----------+----------------+ CFV      Full           Yes      Yes                                   +---------+---------------+---------+-----------+----------+----------------+ SFJ      Full                                                          +---------+---------------+---------+-----------+----------+----------------+ FV Prox  Full                                                           +---------+---------------+---------+-----------+----------+----------------+ FV Mid   Full                                                          +---------+---------------+---------+-----------+----------+----------------+  FV DistalFull                                                          +---------+---------------+---------+-----------+----------+----------------+ PFV      Full                                                          +---------+---------------+---------+-----------+----------+----------------+ POP      Full           Yes      Yes                                   +---------+---------------+---------+-----------+----------+----------------+ PTV      Full                                                          +---------+---------------+---------+-----------+----------+----------------+ PERO                                                  Patent by color. +---------+---------------+---------+-----------+----------+----------------+   +----+---------------+---------+-----------+----------+--------------+ LEFTCompressibilityPhasicitySpontaneityPropertiesThrombus Aging +----+---------------+---------+-----------+----------+--------------+ CFV Full           Yes      Yes                                 +----+---------------+---------+-----------+----------+--------------+     Summary: RIGHT: - There is no evidence of deep vein thrombosis in the lower extremity.  - No cystic structure found in the popliteal fossa.  LEFT: - No evidence of common femoral vein obstruction.   *See table(s) above for measurements and observations. Electronically signed by Gaile New MD on 01/01/2023 at 7:13:05 PM.    Final    DG Abd 1 View Result Date: 01/01/2023 CLINICAL DATA:  Abdominal pain. EXAM: ABDOMEN - 1 VIEW COMPARISON:  PET-CT, 12/16/2022. FINDINGS: Normal bowel gas pattern. 1.1 cm calculus projects in the right kidney, stable from the prior  PET-CT. No left intrarenal stone or evidence of a ureteral stone. Elevated right hemidiaphragm, also stable. No acute skeletal abnormality. Previous posterior lumbar spine fusion at L4-L5 and previous vertebroplasty at L3, L4 and L5. IMPRESSION: 1. No acute findings. 2. Right intrarenal calculus. Electronically Signed   By: Alm Parkins M.D.   On: 01/01/2023 10:29   DG Knee 1-2 Views Right Result Date: 12/31/2022 CLINICAL DATA:  Right hip pain and right leg pain worse since yesterday. Difficulty walking. EXAM: RIGHT KNEE - 1-2 VIEW COMPARISON:  None Available. FINDINGS: Mild degenerative changes in the right knee with mild medial compartment narrowing. No evidence of acute fracture or dislocation. No focal bone lesion or bone destruction. No significant effusions. Vascular calcifications in the soft tissues. IMPRESSION: Mild degenerative changes in  the right knee with medial compartment narrowing. No acute bony abnormalities. Electronically Signed   By: Elsie Gravely M.D.   On: 12/31/2022 22:31   DG HIP UNILAT WITH PELVIS 1V RIGHT Result Date: 12/31/2022 CLINICAL DATA:  Right hip pain with difficulty walking. History of lung cancer. EXAM: DG HIP (WITH OR WITHOUT PELVIS) 1V RIGHT COMPARISON:  PET-CT 12/16/2022 FINDINGS: Mild degenerative changes in the right hip with narrowing and sclerosis of the superior acetabulum and small osteophyte formation. The hip is rotated, limiting evaluation of the femoral neck but no acute displaced fractures are identified as visualized. No focal bone lesion or bone destruction. Pelvis and left hip appear intact. SI joints and symphysis pubis are not displaced. Postoperative changes in the lower lumbar spine. Vascular calcifications. IMPRESSION: Mild degenerative changes in the right hip. No acute displaced fractures are identified. Electronically Signed   By: Elsie Gravely M.D.   On: 12/31/2022 22:30   CT Head Wo Contrast Result Date: 12/31/2022 CLINICAL DATA:  Mental  status change EXAM: CT HEAD WITHOUT CONTRAST TECHNIQUE: Contiguous axial images were obtained from the base of the skull through the vertex without intravenous contrast. RADIATION DOSE REDUCTION: This exam was performed according to the departmental dose-optimization program which includes automated exposure control, adjustment of the mA and/or kV according to patient size and/or use of iterative reconstruction technique. COMPARISON:  05/13/2020 CT head FINDINGS: Brain: No evidence of acute infarction, hemorrhage, mass, mass effect, or midline shift. No hydrocephalus or extra-axial fluid collection. Periventricular white matter changes, likely the sequela of chronic small vessel ischemic disease. Mildly advanced cerebral atrophy for age. Basal ganglia calcifications. Vascular: No hyperdense vessel. Atherosclerotic calcifications in the intracranial carotid and vertebral arteries. Skull: Negative for fracture or focal lesion. Sinuses/Orbits: No acute finding. Other: The mastoid air cells are well aerated. IMPRESSION: No acute intracranial process. Electronically Signed   By: Donald Campion M.D.   On: 12/31/2022 19:42   DG Chest Portable 1 View Result Date: 12/31/2022 CLINICAL DATA:  Altered mental status, shortness of breath. EXAM: PORTABLE CHEST 1 VIEW COMPARISON:  December 01, 2022. FINDINGS: Stable cardiomegaly. Left lung is clear. Minimal right basilar subsegmental atelectasis is noted with small pleural effusion. Bony thorax is unremarkable. IMPRESSION: Minimal right basilar subsegmental atelectasis with small right pleural effusion. Electronically Signed   By: Lynwood Landy Raddle M.D.   On: 12/31/2022 19:37    ASSESSMENT: This is a very pleasant 77 years old white male recently diagnosed with a stage IV (T2b, N3, M1b) non-small cell lung cancer, adenocarcinoma in a patient with history of smoking but quit 37 years ago.  This was diagnosed in December 2024 and presented with large right apical lung mass in  addition to right hilar, mediastinal and supraclavicular lymphadenopathy as well as pulmonary nodules in the right upper lobe and right lower lobe with T10 bone metastasis in addition to medial left inguinal lymph node versus subcutaneous nodule.   PLAN: I had a lengthy discussion with the patient and his family about his current disease stage, prognosis and treatment options.  I personally and independently reviewed the scan images and discussed the results with the patient and his family.    Stage IV Non-Small Cell Lung Cancer (NSCLC) - Adenocarcinoma Stage IV adenocarcinoma of the lung with primary mass in right upper lobe (4.1 x 2.9 x 7.2 cm) and metastasis to right hilar, mediastinal, right neck, inguinal lymph nodes, and T10 vertebra. PET scan confirmed spread. Molecular testing pending; PD-L1 testing insufficient. Discussed  chemotherapy (carboplatin , paclitaxel ), immune therapy (Keytruda), and targeted therapy pending molecular results. Risks include hair loss, nausea, vomiting. Without treatment, survival is 3-6 months; with treatment, 18 months to 2 years. Patient prefers minimizing pain and side effects. Discussed palliative care and hospice. - Continue radiation therapy to T10 vertebra - Await molecular test results - Arrange follow-up in 10 days - Consider blood test for molecular markers if tissue sample insufficient - Discuss palliative care options for pain management and symptom control - Provide educational materials on lung cancer  Chronic Kidney Disease (CKD) Stage 4 CKD stage 4 limits use of certain chemotherapeutic agents (e.g., Alimta). Kidney function will be closely monitored to adjust chemotherapy dosages. - Monitor kidney function regularly - Adjust chemotherapy dosages based on kidney function  Chronic Obstructive Pulmonary Disease (COPD) COPD managed with supplemental oxygen  (1.5 liters intermittently during movement and sleep). No recent exacerbations. - Continue  current oxygen  therapy regimen - Monitor oxygen  saturation levels  Diabetes Mellitus Diabetes well-controlled with insulin  and Trulicity . Recent A1c levels improved significantly. - Continue current diabetes management regimen - Monitor blood glucose levels regularly  Constipation Constipation likely secondary to pain medication use. Managed with laxatives. - Continue use of laxatives as needed - Monitor bowel movements  General Health Maintenance Reviewed medical history including arthritis, arrhythmia, coronary artery disease, depression, hypertension, and hyperlipidemia. No recent issues with diverticulosis or gout. - Continue current management of chronic conditions - Monitor for new symptoms or exacerbations  Follow-up - Arrange follow-up in 10 days - Call if molecular test results insufficient to arrange blood test - Coordinate with palliative care team for pain and symptom management.    The patient voices understanding of current disease status and treatment options and is in agreement with the current care plan.  All questions were answered. The patient knows to call the clinic with any problems, questions or concerns. We can certainly see the patient much sooner if necessary.  Thank you so much for allowing me to participate in the care of Jeffery Fernandez. I will continue to follow up the patient with you and assist in his care.  The total time spent in the appointment was 90 minutes.  Disclaimer: This note was dictated with voice recognition software. Similar sounding words can inadvertently be transcribed and may not be corrected upon review.   Sherrod MARLA Sherrod January 28, 2023, 2:14 PM

## 2023-01-30 ENCOUNTER — Ambulatory Visit: Payer: HMO | Admitting: Radiation Oncology

## 2023-01-30 ENCOUNTER — Other Ambulatory Visit: Payer: Self-pay

## 2023-01-30 ENCOUNTER — Ambulatory Visit
Admission: RE | Admit: 2023-01-30 | Discharge: 2023-01-30 | Disposition: A | Discharge status: Home / Self Care | Payer: HMO | Source: Ambulatory Visit | Attending: Radiation Oncology | Admitting: Radiation Oncology

## 2023-01-30 DIAGNOSIS — Z79899 Other long term (current) drug therapy: Secondary | ICD-10-CM | POA: Insufficient documentation

## 2023-01-30 DIAGNOSIS — N184 Chronic kidney disease, stage 4 (severe): Secondary | ICD-10-CM | POA: Insufficient documentation

## 2023-01-30 DIAGNOSIS — Z510 Encounter for antineoplastic radiation therapy: Secondary | ICD-10-CM | POA: Insufficient documentation

## 2023-01-30 DIAGNOSIS — C7951 Secondary malignant neoplasm of bone: Secondary | ICD-10-CM | POA: Insufficient documentation

## 2023-01-30 DIAGNOSIS — C3411 Malignant neoplasm of upper lobe, right bronchus or lung: Secondary | ICD-10-CM | POA: Diagnosis present

## 2023-01-30 DIAGNOSIS — E1122 Type 2 diabetes mellitus with diabetic chronic kidney disease: Secondary | ICD-10-CM | POA: Diagnosis not present

## 2023-01-30 DIAGNOSIS — I1310 Hypertensive heart and chronic kidney disease without heart failure, with stage 1 through stage 4 chronic kidney disease, or unspecified chronic kidney disease: Secondary | ICD-10-CM | POA: Diagnosis not present

## 2023-01-30 DIAGNOSIS — Z87891 Personal history of nicotine dependence: Secondary | ICD-10-CM | POA: Diagnosis not present

## 2023-01-30 DIAGNOSIS — Z794 Long term (current) use of insulin: Secondary | ICD-10-CM | POA: Diagnosis not present

## 2023-01-30 LAB — RAD ONC ARIA SESSION SUMMARY
Course Elapsed Days: 3
Plan Fractions Treated to Date: 3
Plan Prescribed Dose Per Fraction: 3 Gy
Plan Total Fractions Prescribed: 10
Plan Total Prescribed Dose: 30 Gy
Reference Point Dosage Given to Date: 9 Gy
Reference Point Session Dosage Given: 3 Gy
Session Number: 3

## 2023-01-31 ENCOUNTER — Other Ambulatory Visit: Payer: Self-pay

## 2023-01-31 ENCOUNTER — Encounter: Payer: Self-pay | Admitting: Internal Medicine

## 2023-01-31 ENCOUNTER — Ambulatory Visit
Admission: RE | Admit: 2023-01-31 | Discharge: 2023-01-31 | Disposition: A | Discharge status: Home / Self Care | Payer: HMO | Source: Ambulatory Visit | Attending: Radiation Oncology | Admitting: Radiation Oncology

## 2023-01-31 ENCOUNTER — Encounter (HOSPITAL_COMMUNITY): Payer: Self-pay

## 2023-01-31 DIAGNOSIS — Z510 Encounter for antineoplastic radiation therapy: Secondary | ICD-10-CM | POA: Diagnosis not present

## 2023-01-31 LAB — RAD ONC ARIA SESSION SUMMARY
Course Elapsed Days: 4
Plan Fractions Treated to Date: 4
Plan Prescribed Dose Per Fraction: 3 Gy
Plan Total Fractions Prescribed: 10
Plan Total Prescribed Dose: 30 Gy
Reference Point Dosage Given to Date: 12 Gy
Reference Point Session Dosage Given: 3 Gy
Session Number: 4

## 2023-02-03 ENCOUNTER — Ambulatory Visit
Admission: RE | Admit: 2023-02-03 | Discharge: 2023-02-03 | Disposition: A | Discharge status: Home / Self Care | Payer: HMO | Source: Ambulatory Visit | Attending: Radiation Oncology | Admitting: Radiation Oncology

## 2023-02-03 ENCOUNTER — Other Ambulatory Visit: Payer: Self-pay

## 2023-02-03 ENCOUNTER — Telehealth: Payer: Self-pay | Admitting: Medical Oncology

## 2023-02-03 DIAGNOSIS — Z510 Encounter for antineoplastic radiation therapy: Secondary | ICD-10-CM | POA: Diagnosis not present

## 2023-02-03 LAB — RAD ONC ARIA SESSION SUMMARY
Course Elapsed Days: 7
Plan Fractions Treated to Date: 5
Plan Prescribed Dose Per Fraction: 3 Gy
Plan Total Fractions Prescribed: 10
Plan Total Prescribed Dose: 30 Gy
Reference Point Dosage Given to Date: 15 Gy
Reference Point Session Dosage Given: 3 Gy
Session Number: 5

## 2023-02-03 NOTE — Telephone Encounter (Signed)
 Molecular[facility] - Dtr wants to know the results of  molecular testing . She looked at them on line. She wants to get the results this week before his  next appt is 01/13.

## 2023-02-04 ENCOUNTER — Ambulatory Visit: Payer: HMO

## 2023-02-04 ENCOUNTER — Ambulatory Visit
Admission: RE | Admit: 2023-02-04 | Discharge: 2023-02-04 | Disposition: A | Discharge status: Home / Self Care | Payer: HMO | Source: Ambulatory Visit | Attending: Radiation Oncology | Admitting: Radiation Oncology

## 2023-02-04 ENCOUNTER — Other Ambulatory Visit: Payer: Self-pay

## 2023-02-04 DIAGNOSIS — Z510 Encounter for antineoplastic radiation therapy: Secondary | ICD-10-CM | POA: Diagnosis not present

## 2023-02-04 LAB — RAD ONC ARIA SESSION SUMMARY
Course Elapsed Days: 8
Plan Fractions Treated to Date: 6
Plan Prescribed Dose Per Fraction: 3 Gy
Plan Total Fractions Prescribed: 10
Plan Total Prescribed Dose: 30 Gy
Reference Point Dosage Given to Date: 18 Gy
Reference Point Session Dosage Given: 3 Gy
Session Number: 6

## 2023-02-04 NOTE — Progress Notes (Signed)
 Palliative Medicine [facility]  [phone] [phone]   Name: Jeffery Fernandez Date: 02/04/2023 MRN: [id number]  DOB: Aug 20, 1945  Patient Care Team: Pura Lenis, MD as PCP - General (Family Medicine) Raford Riggs, MD as PCP - Cardiology (Cardiology)    REASON FOR CONSULTATION: Jeffery Fernandez is a 78 y.o. 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 As well as COPD, DM type 2, arthritis, hypertension, and hyperlipidemia. Palliative ask to see for symptom management and goals of care.    SOCIAL HISTORY:     reports that he quit smoking about 37 years ago. His smoking use included cigarettes. He started smoking about 62 years ago. He has a 37.5 pack-year smoking history. He has never used smokeless tobacco. He reports that he does not currently use alcohol. He reports that he does not use drugs.  ADVANCE DIRECTIVES:  Advanced directives on file name Jeffery Fernandez as the primary and Jeffery Fernandez as secondary decision makers should the patient become unable to make his own decisions.   CODE STATUS: Full code  PAST MEDICAL HISTORY: Past Medical History:  Diagnosis Date   Arrhythmia    Arthritis    Cataract    bilateral cataract extraction with intraoccular lens implants   Chest pain, unspecified    Chronic airway obstruction, not elsewhere classified    PT. DENIES HE HAS COPD   CKD (chronic kidney disease)    Colon polyps    Adenomatous Polyps 2007   Depression    Diabetes mellitus    Diverticulosis    Gout    Heart murmur    History of kidney stones    Hyperlipemia    Hypertension    Nephrolithiasis    Normocytic anemia 04/23/2017   Obstructive sleep apnea (adult) (pediatric)    Other and unspecified hyperlipidemia    Other left bundle branch block    Retinal vascular occlusion, unspecified    right   Shortness of breath    on exertion   Unspecified sleep apnea    Vision loss  of right eye    White coat syndrome with diagnosis of hypertension 09/19/2017    PAST SURGICAL HISTORY:  Past Surgical History:  Procedure Laterality Date   ANTERIOR CERVICAL DECOMP/DISCECTOMY FUSION N/A 06/26/2016   Procedure: Re-Exploration of Cervical Wound;  Surgeon: Onetha Kuba, MD;  Location: [facility] OR;  Service: Neurosurgery;  Laterality: N/A;   ANTERIOR CERVICAL DECOMP/DISCECTOMY FUSION N/A 06/26/2016   Procedure: ANTERIOR CERVICAL DECOMPRESSION/DISCECTOMY FUSION, INTERBODY PROSTESIS, PLATE, CERVICAL THREE CERVICAL FOUR, CERVICAL FOUR CERVICAL FIVE CERVICAL SIX;  Surgeon: Mavis Purchase, MD;  Location: [facility] OR;  Service: Neurosurgery;  Laterality: N/A;   APPLICATION OF WOUND VAC  06/05/2020   Procedure: APPLICATION OF WOUND VAC;  Surgeon: Mavis Purchase, MD;  Location: [facility] OR;  Service: Neurosurgery;;   BACK SURGERY     CATARACT EXTRACTION Bilateral    KIDNEY STONE SURGERY     KYPHOPLASTY N/A 01/19/2018   Procedure: KYPHOPLASTY LUMBAR THREE;  Surgeon: Mavis Purchase, MD;  Location: [facility] OR;  Service: Neurosurgery;  Laterality: N/A;  KYPHOPLASTY LUMBAR THREE   LUMBAR WOUND DEBRIDEMENT N/A 06/05/2020   Procedure: LUMBAR WOUND DEBRIDEMENT;  Surgeon: Mavis Purchase, MD;  Location: [facility] OR;  Service: Neurosurgery;  Laterality: N/A;    HEMATOLOGY/ONCOLOGY HISTORY:  Oncology History  Primary adenocarcinoma of upper lobe of right lung (HCC)  01/28/2023 Initial Diagnosis   Primary adenocarcinoma of upper lobe of right lung (HCC)  01/28/2023 Cancer Staging   Staging form: Lung, AJCC 8th Edition - Clinical: Stage IVA (cT2b, cN3, cM1b) - Signed by Sherrod Sherrod, MD on 01/28/2023     ALLERGIES:  is allergic to amlodipine , penicillins, and hydralazine .  MEDICATIONS:  Current Outpatient Medications  Medication Sig Dispense Refill   acetaminophen  (TYLENOL ) 500 MG tablet Take 1,000 mg by mouth every 6 (six) hours as needed (for pain.).     aspirin  EC 81 MG tablet Take 81 mg by mouth daily.  Swallow whole.     atorvastatin  (LIPITOR) 40 MG tablet Take 40 mg by mouth at bedtime.     buPROPion  (WELLBUTRIN  XL) 150 MG 24 hr tablet Take 150 mg by mouth every morning.     busPIRone (BUSPAR) 10 MG tablet Take 10 mg by mouth in the morning.     Dulaglutide  (TRULICITY ) 1.5 MG/0.5ML SOAJ Inject 1.5 mg into the skin daily. Takes on Thursdays     gabapentin  (NEURONTIN ) 100 MG capsule Take 1 capsule (100 mg total) by mouth daily.     insulin  glargine (LANTUS ) 100 UNIT/ML injection Inject 40 Units into the skin in the morning.     sertraline  (ZOLOFT ) 100 MG tablet Take 100 mg by mouth in the morning.     torsemide  (DEMADEX ) 20 MG tablet Take 2 tablets (40 mg total) by mouth daily. (Patient taking differently: Take 40 mg by mouth in the morning.) 60 tablet 0   Turmeric (QC TUMERIC COMPLEX) 500 MG CAPS Take 2,500 mg by mouth in the morning and at bedtime.     No current facility-administered medications for this visit.    VITAL SIGNS: There were no vitals taken for this visit. There were no vitals filed for this visit.  Estimated body mass index is 26.97 kg/m as calculated from the following:   Height as of 01/28/23: 5' 9 (1.753 m).   Weight as of 01/28/23: 182 lb 9.6 oz (82.8 kg).  LABS: CBC:    Component Value Date/Time   WBC 9.4 01/28/2023 1356   WBC 13.9 (H) 01/02/2023 0744   HGB 10.5 (L) 01/28/2023 1356   HCT 32.4 (L) 01/28/2023 1356   PLT 235 01/28/2023 1356   MCV 90.3 01/28/2023 1356   NEUTROABS 6.5 01/28/2023 1356   LYMPHSABS 2.0 01/28/2023 1356   MONOABS 0.7 01/28/2023 1356   EOSABS 0.1 01/28/2023 1356   BASOSABS 0.1 01/28/2023 1356   Comprehensive Metabolic Panel:    Component Value Date/Time   NA 141 01/28/2023 1356   NA 142 07/03/2022 1356   K 4.2 01/28/2023 1356   CL 102 01/28/2023 1356   CO2 29 01/28/2023 1356   BUN 28 (H) 01/28/2023 1356   BUN 22 07/03/2022 1356   CREATININE 3.17 (H) 01/28/2023 1356   GLUCOSE 106 (H) 01/28/2023 1356   CALCIUM  9.5  01/28/2023 1356   AST 10 (L) 01/28/2023 1356   ALT 7 01/28/2023 1356   ALKPHOS 111 01/28/2023 1356   BILITOT 0.4 01/28/2023 1356   PROT 8.3 (H) 01/28/2023 1356   PROT 7.4 07/03/2022 1356   ALBUMIN  3.8 01/28/2023 1356   ALBUMIN  4.1 07/03/2022 1356    RADIOGRAPHIC STUDIES: US  CORE BIOPSY (LYMPH NODES) Result Date: 01/14/2023 INDICATION: 78 year old male with history of right upper lobe lung mass and hypermetabolic right supraclavicular lymph node. EXAM: Ultrasound-guided right supraclavicular lymph node biopsy. MEDICATIONS: None. ANESTHESIA/SEDATION: Moderate (conscious) sedation was employed during this procedure. A total of Versed  1 mg and Fentanyl  50 mcg was administered intravenously. Moderate Sedation Time: 11  minutes. The patient's level of consciousness and vital signs were monitored continuously by radiology nursing throughout the procedure under my direct supervision. FLUOROSCOPY TIME:  None. COMPLICATIONS: None immediate. PROCEDURE: Informed written consent was obtained from the patient after a thorough discussion of the procedural risks, benefits and alternatives. All questions were addressed. Jeffery Fernandez was utilized including caps, mask, sterile gowns, sterile gloves, sterile drape, hand hygiene and skin antiseptic. A timeout was performed prior to the initiation of the procedure. Preprocedure ultrasound evaluation demonstrated an irregularly-shaped, homogeneously hypoechoic right supraclavicular lymph node measuring up to approximately 1.7 cm. The procedure was planned. Subdermal Local anesthesia was provided at the planned needle entry site with 1% lidocaine . A small skin nick was made. Under direct ultrasound visualization, deeper local anesthetic was administered to the periphery of the lymph node. Next, a 17 gauge coaxial introducer needle was directed to the periphery of the lymph node. This was followed by 2 core biopsies with an 18 gauge biopsy device. The  samples were placed in formalin. The introducer needle was removed. Postprocedure ultrasound demonstrated no evidence of surrounding hematoma or other complicating features. The patient tolerated the procedure well and was transferred to the recovery area in good condition. IMPRESSION: Technically successful ultrasound-guided right supraclavicular lymph node biopsy. Jeffery Sides, MD [facility] Radiology Electronically Signed   By: Jeffery Fernandez M.D.   On: 01/14/2023 11:07    PERFORMANCE STATUS (ECOG) : 1 - Symptomatic but completely ambulatory  Review of Systems  Constitutional:  Positive for activity change, appetite change and fatigue.  Musculoskeletal:  Positive for back pain.  Unless otherwise noted, a complete review of systems is negative.  Physical Exam General: NAD Cardiovascular: regular rate and rhythm Pulmonary: clear ant fields Abdomen: soft, nontender, + bowel sounds Extremities: no edema, no joint deformities Skin: no rashes Neurological: Alert and oriented x3  IMPRESSION:  This is my initial visit with Jeffery Fernandez. No acute distress. His wife and daughter are present. He is alert and able to engage appropriately in discussions.   I introduced myself, Maygan RN, and Palliative's role in collaboration with the oncology team. Concept of Palliative Care was introduced as specialized medical care for people and their families living with serious illness.  It focuses on providing relief from the symptoms and stress of a serious illness.  The goal is to improve quality of life for both the patient and the family. Values and goals of care important to patient and family were attempted to be elicited.   Jeffery Fernandez lives in the home with his wife of more than 59 years. He has one daughter and twin grandchildren. Retired from [employer]. Enjoys woodworking although he has not engaged recently due to his health. The patient  also reports feeling nervous in the mornings, with noticeable shaking. This symptom appears to be exacerbated by a lack of restful sleep. The patient has a history of leg weakness, which contributes to balance issues and a fear of falling. He uses a cane or walker for mobility and has not had any recent falls. The patient's constipation is managed with a stool softener, taken daily. The patient also has Zofran  on hand for instances of nausea, although this is not a consistent symptom.   The patient presents with a primary complaint of decreased appetite and disrupted sleep patterns. These symptoms have been ongoing for some time, with periods of improvement and worsening. He reports feeling nauseous upon looking at food at  times, leading to a decreased intake. However, this is not a consistent symptom and varies throughout the day. The patient's appetite appears to be better in the mornings, with a noted decrease as the day progresses. Despite this, the patient's weight has remained stable. Current weight 187lbs. We discussed his overall nutrition at length.  Education provided on increased protein intake with possibility of including Glucerna once a day when he does not eat a sufficient meal.  Also encouraged patient to focus on small frequent meals versus large meals.   Jeffery Fernandez also reports disrupted sleep patterns, often waking up in the middle of the night feeling wide awake. This has been managed with Tylenol  PM, but the patient reports that this does not seem to be as effective as before. The patient's sleep is also interrupted by frequent trips to the bathroom due to a high fluid intake. Wife endorses that they have tried melatonin in the past with minimal results.  Recommended attempting to melatonin Gummies to aid in sleep.  We will plan to continue to closely monitor.  The patient has a history of back pain, which has been ongoing for two years following back surgery. The pain has worsened since  cancer diagnosis. He is managing his pain with hydrocodone , as needed. Reports he generally will take once daily.  Jeffery Fernandez has been getting his hydrocodone  filled through Dr. Pura however now with plans to transition pain management to our palliative team.  Education provided on refill request and medication use.  The patient describes pain as sharp and throbbing at times.  Will continue with current dosage with no changes at this time.   He has been undergoing radiation treatment for cancer, which has been causing fatigue. Recently completed home PT last week. Jeffery Fernandez and his family expresses plans to try chemotherapy. The patient's daughter reports that the patient has been very nervous, especially when faced with new or unfamiliar situations. This has been managed with Buspar and gabapentin , although the dosage has been reduced due to concerns about side effects per his PCP.   We discussed his current illness and what it means in the larger context of his on-going co-morbidities. Natural disease trajectory and expectations were discussed. They are realistic in their understanding. Open to treat allowing him every opportunity to thrive making note that his quality of life is most important.  I empathetically approached discussions regarding advanced directives, healthcare limitations, and patient wishes. Jeffery Fernandez shares he has completed advanced directives. Document has been reviewed.  His wife Jeffery Fernandez and [name] are his documented medical decision makers. He desires for a natural dying process, do want artificial hydration if needed.   I discussed the importance of continued conversation with family and their medical providers regarding overall plan of care and treatment options, ensuring decisions are within the context of the patients values and GOCs.  PLAN: Established therapeutic relationship. Education provided on palliative's role in collaboration with their Oncology/Radiation  team.  Assessment and Plan Cancer-related symptoms Morning nausea, poor appetite, and back pain. No significant pain increase. Currently managed with hydrocodone  1 tablet daily as needed. Constipation likely secondary to opioid use and managed with stool softener. -Continue current regimen. -Consider adding Glycine for added protein if appetite does not improve. -Notify provider if changes in pain occur.  Sleep disturbance Difficulty maintaining sleep, possibly related to frequent urination and anxiety. Currently using Tylenol  PM with limited benefit. -Increase Tylenol  PM to 1.5 tablets at bedtime, potentially increasing to 2 tablets  if needed. -If no improvement, consider retrying melatonin. -If still no improvement after 2-3 weeks, consider prescription sleep aid.  Chronic back pain History of back surgery and ongoing pain, possibly related to T10 lesion. Pain managed with hydrocodone  1 tablet daily as needed. -Continue current regimen. -Notify provider if changes in pain occur.  Fatigue and weakness Likely multifactorial, related to cancer, radiation treatment, and possibly poor sleep and nutrition. -Monitor closely. -Consider re-initiating physical therapy if symptoms persist or worsen.  Anxiety Morning nervousness and shaking, possibly related to cancer diagnosis and treatment. Currently managed with Buspar and gabapentin . -Continue current regimen. -Notify provider if symptoms worsen.  General Health Maintenance -Continue current medication regimen, including torasemide, Zoloft , Lantus , Trulicity , Wellbutrin , Lipitor, aspirin , Tylenol  as needed, and atorvastatin . -Check in with provider for hydrocodone  refills as needed. -Ensure advanced directives and living will documents are in place.   Patient expressed understanding and was in agreement with this plan. He also understands that He can call the clinic at any time with any questions, concerns, or complaints.   Thank you  for your referral and allowing Palliative to assist in Mr. Jeffery Fernandez's care.   Number and complexity of problems addressed: HIGH - 1 or more chronic illnesses with SEVERE exacerbation, progression, or side effects of treatment - advanced cancer, pain. Any controlled substances utilized were prescribed in the context of palliative care.   Visit consisted of counseling and education dealing with the complex and emotionally intense issues of symptom management and palliative care in the setting of serious and potentially life-threatening illness.  Signed by: Jeffery Fernandez, AGPCNP-BC Palliative Medicine Team/[facility]

## 2023-02-05 ENCOUNTER — Encounter: Payer: Self-pay | Admitting: Nurse Practitioner

## 2023-02-05 ENCOUNTER — Other Ambulatory Visit: Payer: Self-pay

## 2023-02-05 ENCOUNTER — Inpatient Hospital Stay: Payer: HMO | Attending: Nurse Practitioner | Admitting: Nurse Practitioner

## 2023-02-05 ENCOUNTER — Ambulatory Visit
Admission: RE | Admit: 2023-02-05 | Discharge: 2023-02-05 | Disposition: A | Discharge status: Home / Self Care | Payer: HMO | Source: Ambulatory Visit | Attending: Radiation Oncology | Admitting: Radiation Oncology

## 2023-02-05 VITALS — BP 102/63 | HR 110 | Temp 97.4°F | Resp 18 | Ht 69.0 in | Wt 187.8 lb

## 2023-02-05 DIAGNOSIS — G893 Neoplasm related pain (acute) (chronic): Secondary | ICD-10-CM | POA: Diagnosis not present

## 2023-02-05 DIAGNOSIS — Z923 Personal history of irradiation: Secondary | ICD-10-CM | POA: Insufficient documentation

## 2023-02-05 DIAGNOSIS — Z510 Encounter for antineoplastic radiation therapy: Secondary | ICD-10-CM | POA: Diagnosis not present

## 2023-02-05 DIAGNOSIS — K219 Gastro-esophageal reflux disease without esophagitis: Secondary | ICD-10-CM | POA: Insufficient documentation

## 2023-02-05 DIAGNOSIS — R530 Neoplastic (malignant) related fatigue: Secondary | ICD-10-CM

## 2023-02-05 DIAGNOSIS — K1231 Oral mucositis (ulcerative) due to antineoplastic therapy: Secondary | ICD-10-CM | POA: Insufficient documentation

## 2023-02-05 DIAGNOSIS — C3411 Malignant neoplasm of upper lobe, right bronchus or lung: Secondary | ICD-10-CM | POA: Insufficient documentation

## 2023-02-05 DIAGNOSIS — K521 Toxic gastroenteritis and colitis: Secondary | ICD-10-CM | POA: Insufficient documentation

## 2023-02-05 DIAGNOSIS — M7918 Myalgia, other site: Secondary | ICD-10-CM | POA: Insufficient documentation

## 2023-02-05 DIAGNOSIS — Z5112 Encounter for antineoplastic immunotherapy: Secondary | ICD-10-CM | POA: Insufficient documentation

## 2023-02-05 DIAGNOSIS — Z5111 Encounter for antineoplastic chemotherapy: Secondary | ICD-10-CM | POA: Insufficient documentation

## 2023-02-05 DIAGNOSIS — Z79899 Other long term (current) drug therapy: Secondary | ICD-10-CM | POA: Insufficient documentation

## 2023-02-05 DIAGNOSIS — D701 Agranulocytosis secondary to cancer chemotherapy: Secondary | ICD-10-CM | POA: Insufficient documentation

## 2023-02-05 DIAGNOSIS — Z515 Encounter for palliative care: Secondary | ICD-10-CM

## 2023-02-05 DIAGNOSIS — Z7189 Other specified counseling: Secondary | ICD-10-CM | POA: Diagnosis not present

## 2023-02-05 DIAGNOSIS — T451X5A Adverse effect of antineoplastic and immunosuppressive drugs, initial encounter: Secondary | ICD-10-CM | POA: Insufficient documentation

## 2023-02-05 DIAGNOSIS — C7951 Secondary malignant neoplasm of bone: Secondary | ICD-10-CM | POA: Insufficient documentation

## 2023-02-05 LAB — RAD ONC ARIA SESSION SUMMARY
Course Elapsed Days: 9
Plan Fractions Treated to Date: 7
Plan Prescribed Dose Per Fraction: 3 Gy
Plan Total Fractions Prescribed: 10
Plan Total Prescribed Dose: 30 Gy
Reference Point Dosage Given to Date: 21 Gy
Reference Point Session Dosage Given: 3 Gy
Session Number: 7

## 2023-02-06 ENCOUNTER — Ambulatory Visit
Admission: RE | Admit: 2023-02-06 | Discharge: 2023-02-06 | Disposition: A | Discharge status: Home / Self Care | Payer: HMO | Source: Ambulatory Visit | Attending: Radiation Oncology | Admitting: Radiation Oncology

## 2023-02-06 ENCOUNTER — Other Ambulatory Visit: Payer: Self-pay

## 2023-02-06 DIAGNOSIS — Z510 Encounter for antineoplastic radiation therapy: Secondary | ICD-10-CM | POA: Diagnosis not present

## 2023-02-06 LAB — RAD ONC ARIA SESSION SUMMARY
Course Elapsed Days: 10
Plan Fractions Treated to Date: 8
Plan Prescribed Dose Per Fraction: 3 Gy
Plan Total Fractions Prescribed: 10
Plan Total Prescribed Dose: 30 Gy
Reference Point Dosage Given to Date: 24 Gy
Reference Point Session Dosage Given: 3 Gy
Session Number: 8

## 2023-02-07 ENCOUNTER — Other Ambulatory Visit: Payer: Self-pay

## 2023-02-07 ENCOUNTER — Ambulatory Visit
Admission: RE | Admit: 2023-02-07 | Discharge: 2023-02-07 | Disposition: A | Discharge status: Home / Self Care | Payer: HMO | Source: Ambulatory Visit | Attending: Radiation Oncology | Admitting: Radiation Oncology

## 2023-02-07 DIAGNOSIS — Z510 Encounter for antineoplastic radiation therapy: Secondary | ICD-10-CM | POA: Diagnosis not present

## 2023-02-07 LAB — RAD ONC ARIA SESSION SUMMARY
Course Elapsed Days: 11
Plan Fractions Treated to Date: 9
Plan Prescribed Dose Per Fraction: 3 Gy
Plan Total Fractions Prescribed: 10
Plan Total Prescribed Dose: 30 Gy
Reference Point Dosage Given to Date: 27 Gy
Reference Point Session Dosage Given: 3 Gy
Session Number: 9

## 2023-02-10 ENCOUNTER — Ambulatory Visit
Admission: RE | Admit: 2023-02-10 | Discharge: 2023-02-10 | Disposition: A | Discharge status: Home / Self Care | Payer: HMO | Source: Ambulatory Visit | Attending: Radiation Oncology | Admitting: Radiation Oncology

## 2023-02-10 ENCOUNTER — Other Ambulatory Visit: Payer: Self-pay

## 2023-02-10 ENCOUNTER — Other Ambulatory Visit: Payer: HMO

## 2023-02-10 ENCOUNTER — Ambulatory Visit: Payer: HMO | Admitting: Physician Assistant

## 2023-02-10 ENCOUNTER — Ambulatory Visit: Payer: HMO

## 2023-02-10 DIAGNOSIS — Z510 Encounter for antineoplastic radiation therapy: Secondary | ICD-10-CM | POA: Diagnosis not present

## 2023-02-10 LAB — RAD ONC ARIA SESSION SUMMARY
Course Elapsed Days: 14
Plan Fractions Treated to Date: 10
Plan Prescribed Dose Per Fraction: 3 Gy
Plan Total Fractions Prescribed: 10
Plan Total Prescribed Dose: 30 Gy
Reference Point Dosage Given to Date: 30 Gy
Reference Point Session Dosage Given: 3 Gy
Session Number: 10

## 2023-02-11 ENCOUNTER — Ambulatory Visit: Payer: HMO

## 2023-02-11 ENCOUNTER — Encounter: Payer: Self-pay | Admitting: Nurse Practitioner

## 2023-02-11 ENCOUNTER — Inpatient Hospital Stay (HOSPITAL_BASED_OUTPATIENT_CLINIC_OR_DEPARTMENT_OTHER): Payer: HMO | Admitting: Nurse Practitioner

## 2023-02-11 ENCOUNTER — Inpatient Hospital Stay (HOSPITAL_BASED_OUTPATIENT_CLINIC_OR_DEPARTMENT_OTHER): Payer: HMO | Admitting: Internal Medicine

## 2023-02-11 ENCOUNTER — Inpatient Hospital Stay: Payer: HMO

## 2023-02-11 VITALS — BP 148/90 | HR 103 | Temp 98.7°F | Resp 16 | Ht 69.0 in | Wt 184.7 lb

## 2023-02-11 DIAGNOSIS — K219 Gastro-esophageal reflux disease without esophagitis: Secondary | ICD-10-CM | POA: Diagnosis not present

## 2023-02-11 DIAGNOSIS — G893 Neoplasm related pain (acute) (chronic): Secondary | ICD-10-CM

## 2023-02-11 DIAGNOSIS — R530 Neoplastic (malignant) related fatigue: Secondary | ICD-10-CM

## 2023-02-11 DIAGNOSIS — Z515 Encounter for palliative care: Secondary | ICD-10-CM | POA: Diagnosis not present

## 2023-02-11 DIAGNOSIS — T451X5A Adverse effect of antineoplastic and immunosuppressive drugs, initial encounter: Secondary | ICD-10-CM | POA: Diagnosis not present

## 2023-02-11 DIAGNOSIS — C3411 Malignant neoplasm of upper lobe, right bronchus or lung: Secondary | ICD-10-CM

## 2023-02-11 DIAGNOSIS — M7918 Myalgia, other site: Secondary | ICD-10-CM | POA: Diagnosis not present

## 2023-02-11 DIAGNOSIS — C7951 Secondary malignant neoplasm of bone: Secondary | ICD-10-CM | POA: Diagnosis not present

## 2023-02-11 DIAGNOSIS — C3491 Malignant neoplasm of unspecified part of right bronchus or lung: Secondary | ICD-10-CM

## 2023-02-11 DIAGNOSIS — Z923 Personal history of irradiation: Secondary | ICD-10-CM | POA: Diagnosis not present

## 2023-02-11 DIAGNOSIS — Z5111 Encounter for antineoplastic chemotherapy: Secondary | ICD-10-CM | POA: Diagnosis present

## 2023-02-11 DIAGNOSIS — Z5112 Encounter for antineoplastic immunotherapy: Secondary | ICD-10-CM | POA: Diagnosis present

## 2023-02-11 DIAGNOSIS — K521 Toxic gastroenteritis and colitis: Secondary | ICD-10-CM | POA: Diagnosis not present

## 2023-02-11 DIAGNOSIS — Z79899 Other long term (current) drug therapy: Secondary | ICD-10-CM | POA: Diagnosis not present

## 2023-02-11 DIAGNOSIS — D701 Agranulocytosis secondary to cancer chemotherapy: Secondary | ICD-10-CM | POA: Diagnosis not present

## 2023-02-11 DIAGNOSIS — K1231 Oral mucositis (ulcerative) due to antineoplastic therapy: Secondary | ICD-10-CM | POA: Diagnosis not present

## 2023-02-11 DIAGNOSIS — G4709 Other insomnia: Secondary | ICD-10-CM

## 2023-02-11 LAB — CMP (CANCER CENTER ONLY)
ALT: 5 U/L (ref 0–44)
AST: 10 U/L — ABNORMAL LOW (ref 15–41)
Albumin: 3.5 g/dL (ref 3.5–5.0)
Alkaline Phosphatase: 97 U/L (ref 38–126)
Anion gap: 8 (ref 5–15)
BUN: 23 mg/dL (ref 8–23)
CO2: 27 mmol/L (ref 22–32)
Calcium: 9.1 mg/dL (ref 8.9–10.3)
Chloride: 104 mmol/L (ref 98–111)
Creatinine: 2.67 mg/dL — ABNORMAL HIGH (ref 0.61–1.24)
GFR, Estimated: 24 mL/min — ABNORMAL LOW (ref >60)
Glucose, Bld: 137 mg/dL — ABNORMAL HIGH (ref 70–99)
Potassium: 3.6 mmol/L (ref 3.5–5.1)
Sodium: 139 mmol/L (ref 135–145)
Total Bilirubin: 0.4 mg/dL (ref 0.0–1.2)
Total Protein: 7.5 g/dL (ref 6.5–8.1)

## 2023-02-11 LAB — CBC WITH DIFFERENTIAL (CANCER CENTER ONLY)
Abs Immature Granulocytes: 0.04 10*3/uL (ref 0.00–0.07)
Basophils Absolute: 0 10*3/uL (ref 0.0–0.1)
Basophils Relative: 1 %
Eosinophils Absolute: 0.2 10*3/uL (ref 0.0–0.5)
Eosinophils Relative: 2 %
HCT: 29.9 % — ABNORMAL LOW (ref 39.0–52.0)
Hemoglobin: 10.1 g/dL — ABNORMAL LOW (ref 13.0–17.0)
Immature Granulocytes: 1 %
Lymphocytes Relative: 10 %
Lymphs Abs: 0.9 10*3/uL (ref 0.7–4.0)
MCH: 29.9 pg (ref 26.0–34.0)
MCHC: 33.8 g/dL (ref 30.0–36.0)
MCV: 88.5 fL (ref 80.0–100.0)
Monocytes Absolute: 0.6 10*3/uL (ref 0.1–1.0)
Monocytes Relative: 7 %
Neutro Abs: 6.7 10*3/uL (ref 1.7–7.7)
Neutrophils Relative %: 79 %
Platelet Count: 205 10*3/uL (ref 150–400)
RBC: 3.38 MIL/uL — ABNORMAL LOW (ref 4.22–5.81)
RDW: 14.5 % (ref 11.5–15.5)
WBC Count: 8.4 10*3/uL (ref 4.0–10.5)
nRBC: 0 % (ref 0.0–0.2)

## 2023-02-11 MED ORDER — PROCHLORPERAZINE MALEATE 10 MG PO TABS: 10.0000 mg | ORAL_TABLET | Freq: Four times a day (QID) | ORAL | 1 refills | Status: DC | PRN

## 2023-02-11 MED ORDER — LIDOCAINE-PRILOCAINE 2.5-2.5 % EX CREA: TOPICAL_CREAM | CUTANEOUS | 3 refills | Status: DC

## 2023-02-11 NOTE — Progress Notes (Signed)
 START OFF PATHWAY REGIMEN - Non-Small Cell Lung   [id number]:$[id number]$$[id number]$  IV D1,22 + Paclitaxel  IV D1,22 + Ipilimumab  IV D1 + Nivolumab  IV D1,22 q42 Days x 1 Cycle Followed by Ipilimumab  IV D1 + Nivolumab  IV D1,22 q42 Days:   Cycle 1: A cycle is 42 days:     Nivolumab       Ipilimumab       Carboplatin       Paclitaxel     Cycles 2 and beyond: A cycle is every 42 days:     Nivolumab       Ipilimumab    **Always confirm dose/schedule in your pharmacy ordering system**  Patient Characteristics: Stage IV Metastatic, Nonsquamous, Molecular Analysis Completed, Molecular Alteration Present and Targeted Therapy Exhausted OR KRAS G12C+ or HER2+ Present and No Prior Chemo/Immunotherapy OR No Alteration Present, Initial Chemotherapy/Immunotherapy, PS =  0, 1, No Alteration Present, No Alteration Present, Candidate for Immunotherapy, PD-L1 Expression Positive 1-49% (TPS) / Negative / Not Tested / Awaiting Test Results and Immunotherapy Candidate Therapeutic Status: Stage IV Metastatic Histology: Nonsquamous Cell Broad Molecular Profiling Status: Molecular Analysis Completed Molecular Analysis Results: No Alteration Present ECOG Performance Status: 1 Chemotherapy/Immunotherapy Line of Therapy: Initial Chemotherapy/Immunotherapy EGFR Exons 18-21 Mutation Testing Status: Completed and Negative ALK Fusion/Rearrangement Testing Status: Completed and Negative BRAF V600 Mutation Testing Status: Completed and Negative KRAS G12C Mutation Testing Status: Completed and Negative MET Exon 14 Mutation Testing Status: Completed and Negative RET Fusion/Rearrangement Testing Status: Completed and Negative HER2 Mutation Testing Status: Completed and Negative NTRK Fusion/Rearrangement Testing Status: Completed and Negative ROS1 Fusion/Rearrangement Testing Status: Completed and Negative Immunotherapy Candidate Status: Candidate for Immunotherapy PD-L1 Expression Status: Quantity Not Sufficient Intent of  Therapy: Non-Curative / Palliative Intent, Discussed with Patient

## 2023-02-11 NOTE — Progress Notes (Signed)
 [facility] [phone]   [phone]  OFFICE PROGRESS NOTE  Pura Lenis, MD [address] [place] [address]  DIAGNOSIS: Stage IV (T2b, N3, M1b) non-small cell lung cancer, adenocarcinoma . This was diagnosed in December 2024 and presented with large right apical lung mass in addition to right hilar, mediastinal and supraclavicular lymphadenopathy as well as pulmonary nodules in the right upper lobe and right lower lobe with T10 bone metastasis in addition to medial left inguinal lymph node versus subcutaneous nodule.   Biomarker Findings HRD signature - HRDsig Negative Microsatellite status - Cannot Be Determined ? Tumor Mutational Burden - 8 Muts/Mb Genomic Findings For a complete list of the genes assayed, please refer to the Appendix. CDKN2A loss KEAP1 K97fs*60 STK11 E291* CDKN2B loss IKZF1 R111* TP53 R280K 8 Disease relevant genes with no reportable alterations: ALK, BRAF, EGFR, ERBB2, KRAS, MET, RET, ROS1  PRIOR THERAPY: Palliative radiotherapy to the T10 bone metastasis.  CURRENT THERAPY: First-line treatment with chemoimmunotherapy in the form of carboplatin  for AUC of 5 and paclitaxel  175 Mg/M2 on days 1 and 22 in addition to ipilimumab  1 Mg/KG on day 1 and nivolumab  360 Mg IV on days 1 and 22 for 1 cycle followed by maintenance treatment with ipilimumab  1 Mg/KG every 6 weeks and nivolumab  360 Mg IV every 3 weeks.  First dose February 18, 2023.  INTERVAL HISTORY: Jeffery Fernandez 78 y.o. male returns to the clinic today for follow-up visit accompanied by his wife and daughter.Discussed the use of AI scribe software for clinical note transcription with the patient, who gave verbal consent to proceed.  History of Present Illness   The patient, a 78 year old individual diagnosed with stage four non-small cell lung cancer (adenocarcinoma) in December 2024, presents with worsening symptoms. The cancer, initially  identified in the right upper lobe, has metastasized to the right hilar mediastinal lymph nodes, subarachlavicular, inguinal lymph nodes, and T10 bone. Molecular studies were conducted but did not reveal any actionable mutations.  The patient reports feeling unwell, particularly in the mornings, with significant nausea that has led to decreased food intake due to fear of vomiting. He also notes increasing weakness in his legs and persistent pain across his back, corresponding to the location of the T10 bone lesion. The patient also reports severe itching, particularly at night, which has disrupted his sleep.  The patient recently completed a course of radiation therapy, which may be contributing to the reported symptoms. He is also under the care of a palliative care team and has been prescribed hydrocodone  for pain management and Zofran  for nausea. Despite these interventions, the patient's symptoms have not significantly improved.  The patient's kidney function is compromised, with a GFR of 18, which will influence the choice of chemotherapy drugs. The patient expresses concern about the potential side effects of chemotherapy, particularly nausea and vomiting.       MEDICAL HISTORY: Past Medical History:  Diagnosis Date   Arrhythmia    Arthritis    Cataract    bilateral cataract extraction with intraoccular lens implants   Chest pain, unspecified    Chronic airway obstruction, not elsewhere classified    PT. DENIES HE HAS COPD   CKD (chronic kidney disease)    Colon polyps    Adenomatous Polyps 2007   Depression    Diabetes mellitus    Diverticulosis    Gout    Heart murmur    History of kidney stones  Hyperlipemia    Hypertension    Nephrolithiasis    Normocytic anemia 04/23/2017   Obstructive sleep apnea (adult) (pediatric)    Other and unspecified hyperlipidemia    Other left bundle branch block    Retinal vascular occlusion, unspecified    right   Shortness of breath     on exertion   Unspecified sleep apnea    Vision loss of right eye    White coat syndrome with diagnosis of hypertension 09/19/2017    ALLERGIES:  is allergic to amlodipine , penicillins, and hydralazine .  MEDICATIONS:  Current Outpatient Medications  Medication Sig Dispense Refill   acetaminophen  (TYLENOL ) 500 MG tablet Take 1,000 mg by mouth every 6 (six) hours as needed (for pain.).     aspirin  EC 81 MG tablet Take 81 mg by mouth daily. Swallow whole.     atorvastatin  (LIPITOR) 40 MG tablet Take 40 mg by mouth at bedtime.     buPROPion  (WELLBUTRIN  XL) 150 MG 24 hr tablet Take 150 mg by mouth every morning.     busPIRone (BUSPAR) 10 MG tablet Take 10 mg by mouth in the morning.     Dulaglutide  (TRULICITY ) 1.5 MG/0.5ML SOAJ Inject 1.5 mg into the skin daily. Takes on Thursdays     gabapentin  (NEURONTIN ) 100 MG capsule Take 1 capsule (100 mg total) by mouth daily.     HYDROcodone -acetaminophen  (NORCO/VICODIN) 5-325 MG tablet Take 1 tablet by mouth daily as needed for moderate pain (pain score 4-6) or severe pain (pain score 7-10).     insulin  glargine (LANTUS ) 100 UNIT/ML injection Inject 40 Units into the skin in the morning.     sertraline  (ZOLOFT ) 100 MG tablet Take 100 mg by mouth in the morning.     torsemide  (DEMADEX ) 20 MG tablet Take 2 tablets (40 mg total) by mouth daily. (Patient taking differently: Take 40 mg by mouth in the morning.) 60 tablet 0   No current facility-administered medications for this visit.    SURGICAL HISTORY:  Past Surgical History:  Procedure Laterality Date   ANTERIOR CERVICAL DECOMP/DISCECTOMY FUSION N/A 06/26/2016   Procedure: Re-Exploration of Cervical Wound;  Surgeon: Onetha Kuba, MD;  Location: [facility] OR;  Service: Neurosurgery;  Laterality: N/A;   ANTERIOR CERVICAL DECOMP/DISCECTOMY FUSION N/A 06/26/2016   Procedure: ANTERIOR CERVICAL DECOMPRESSION/DISCECTOMY FUSION, INTERBODY PROSTESIS, PLATE, CERVICAL THREE CERVICAL FOUR, CERVICAL FOUR CERVICAL FIVE  CERVICAL SIX;  Surgeon: Mavis Purchase, MD;  Location: [facility] OR;  Service: Neurosurgery;  Laterality: N/A;   APPLICATION OF WOUND VAC  06/05/2020   Procedure: APPLICATION OF WOUND VAC;  Surgeon: Mavis Purchase, MD;  Location: [facility] OR;  Service: Neurosurgery;;   BACK SURGERY     CATARACT EXTRACTION Bilateral    KIDNEY STONE SURGERY     KYPHOPLASTY N/A 01/19/2018   Procedure: KYPHOPLASTY LUMBAR THREE;  Surgeon: Mavis Purchase, MD;  Location: [facility] OR;  Service: Neurosurgery;  Laterality: N/A;  KYPHOPLASTY LUMBAR THREE   LUMBAR WOUND DEBRIDEMENT N/A 06/05/2020   Procedure: LUMBAR WOUND DEBRIDEMENT;  Surgeon: Mavis Purchase, MD;  Location: [facility] OR;  Service: Neurosurgery;  Laterality: N/A;    REVIEW OF SYSTEMS:  Constitutional: positive for fatigue Eyes: negative Ears, nose, mouth, throat, and face: negative Respiratory: positive for cough and dyspnea on exertion Cardiovascular: negative Gastrointestinal: negative Genitourinary:negative Integument/breast: negative Hematologic/lymphatic: negative Musculoskeletal:positive for back pain Neurological: negative Behavioral/Psych: negative Endocrine: negative Allergic/Immunologic: negative   PHYSICAL EXAMINATION: General appearance: alert, cooperative, fatigued, and no distress Head: Normocephalic, without obvious abnormality, atraumatic Neck: no adenopathy, no  JVD, supple, symmetrical, trachea midline, and thyroid  not enlarged, symmetric, no tenderness/mass/nodules Lymph nodes: Cervical, supraclavicular, and axillary nodes normal. Resp: clear to auscultation bilaterally Back: symmetric, no curvature. ROM normal. No CVA tenderness. Cardio: regular rate and rhythm, S1, S2 normal, no murmur, click, rub or gallop GI: soft, non-tender; bowel sounds normal; no masses,  no organomegaly Extremities: extremities normal, atraumatic, no cyanosis or edema Neurologic: Alert and oriented X 3, normal strength and tone. Normal symmetric reflexes. Normal  coordination and gait  ECOG PERFORMANCE STATUS: 1 - Symptomatic but completely ambulatory  Blood pressure (!) 148/90, pulse (!) 103, temperature 98.7 F (37.1 C), temperature source Temporal, resp. rate 16, height 5' 9 (1.753 m), weight 184 lb 11.2 oz (83.8 kg), SpO2 97%.  LABORATORY DATA: Lab Results  Component Value Date   WBC 8.4 02/11/2023   HGB 10.1 (L) 02/11/2023   HCT 29.9 (L) 02/11/2023   MCV 88.5 02/11/2023   PLT 205 02/11/2023      Chemistry      Component Value Date/Time   NA 139 02/11/2023 1028   NA 142 07/03/2022 1356   K 3.6 02/11/2023 1028   CL 104 02/11/2023 1028   CO2 27 02/11/2023 1028   BUN 23 02/11/2023 1028   BUN 22 07/03/2022 1356   CREATININE 2.67 (H) 02/11/2023 1028      Component Value Date/Time   CALCIUM  9.1 02/11/2023 1028   ALKPHOS 97 02/11/2023 1028   AST 10 (L) 02/11/2023 1028   ALT 5 02/11/2023 1028   BILITOT 0.4 02/11/2023 1028       RADIOGRAPHIC STUDIES: US  CORE BIOPSY (LYMPH NODES) Result Date: 01/14/2023 INDICATION: 78 year old male with history of right upper lobe lung mass and hypermetabolic right supraclavicular lymph node. EXAM: Ultrasound-guided right supraclavicular lymph node biopsy. MEDICATIONS: None. ANESTHESIA/SEDATION: Moderate (conscious) sedation was employed during this procedure. A total of Versed  1 mg and Fentanyl  50 mcg was administered intravenously. Moderate Sedation Time: 11 minutes. The patient's level of consciousness and vital signs were monitored continuously by radiology nursing throughout the procedure under my direct supervision. FLUOROSCOPY TIME:  None. COMPLICATIONS: None immediate. PROCEDURE: Informed written consent was obtained from the patient after a thorough discussion of the procedural risks, benefits and alternatives. All questions were addressed. Maximal Sterile Barrier Technique was utilized including caps, mask, sterile gowns, sterile gloves, sterile drape, hand hygiene and skin antiseptic. A timeout  was performed prior to the initiation of the procedure. Preprocedure ultrasound evaluation demonstrated an irregularly-shaped, homogeneously hypoechoic right supraclavicular lymph node measuring up to approximately 1.7 cm. The procedure was planned. Subdermal Local anesthesia was provided at the planned needle entry site with 1% lidocaine . A small skin nick was made. Under direct ultrasound visualization, deeper local anesthetic was administered to the periphery of the lymph node. Next, a 17 gauge coaxial introducer needle was directed to the periphery of the lymph node. This was followed by 2 core biopsies with an 18 gauge biopsy device. The samples were placed in formalin. The introducer needle was removed. Postprocedure ultrasound demonstrated no evidence of surrounding hematoma or other complicating features. The patient tolerated the procedure well and was transferred to the recovery area in good condition. IMPRESSION: Technically successful ultrasound-guided right supraclavicular lymph node biopsy. Ester Sides, MD [facility] Electronically Signed   By: Ester Sides M.D.   On: 01/14/2023 11:07    ASSESSMENT AND PLAN: This is a very pleasant 78 years old white male with  Stage IV (T2b, N3,  M1b) non-small cell lung cancer, adenocarcinoma . This was diagnosed in December 2024 and presented with large right apical lung mass in addition to right hilar, mediastinal and supraclavicular lymphadenopathy as well as pulmonary nodules in the right upper lobe and right lower lobe with T10 bone metastasis in addition to medial left inguinal lymph node versus subcutaneous nodule.  Molecular studies showed no actionable mutation and there was insufficient material for PD-L1 expression. The patient status post palliative radiotherapy to the T10 vertebral body lesion. I had a lengthy discussion with the patient and his family today about his current condition  and treatment options.  He was given the option of palliative care and hospice referral versus consideration of systemic palliative treatment with chemoimmunotherapy.    Stage IV Non-Small Cell Lung Cancer (NSCLC), Adenocarcinoma Diagnosed December 2024 with right upper lobe mass, metastasis to right hilar, mediastinal, supraclavicular, inguinal lymph nodes, and T10 bone lesion. Molecular studies showed no actionable mutations; insufficient material for PD-L1 testing. Patient experiencing nausea, leg weakness, and back pain likely related to T10 lesion and recent radiation therapy. Discussed chemoimmunotherapy. Patient prefers less chemotherapy and more immunotherapy. Opted for two rounds of chemo with immunotherapy. Risks and benefits discussed, including side effects such as nausea, vomiting, fatigue, hair loss, and blood count drops. Alternative of palliative care and hospice discussed. - Administer carboplatin  and paclitaxel  for two rounds, one every three weeks. - Administer nivolumab  (Opdivo ) every three weeks and ipilimumab  (Yervoy ) every six weeks, continuing up to two years. - Monitor blood counts, kidney, and liver function weekly. - Order port placement for chemotherapy administration. - Schedule chemotherapy class for patient education. - Provide 'Living with Lung Cancer' resource book. - Coordinate with palliative care team for ongoing support.  Pain Management Pain related to T10 bone lesion. Currently on hydrocodone  prescribed by family doctor. - Continue current pain management regimen with hydrocodone . - Follow up with palliative care team for ongoing pain management.  Nausea Management Significant nausea impacting ability to eat. Currently prescribed Zofran . - Continue Zofran  for nausea management. - Monitor for effectiveness and adjust as needed.  Radiation Therapy Side Effects Experiencing itching at radiation port site, a common side effect. - Monitor symptoms and provide  supportive care as needed.  Follow-up - Schedule follow-up scans every nine weeks to assess treatment response. - Follow up with palliative care team after oncology visit.   The patient was advised to call immediately if he has any other concerning symptoms in the interval. The patient voices understanding of current disease status and treatment options and is in agreement with the current care plan.  All questions were answered. The patient knows to call the clinic with any problems, questions or concerns. We can certainly see the patient much sooner if necessary.  The total time spent in the appointment was 30 minutes.  Disclaimer: This note was dictated with voice recognition software. Similar sounding words can inadvertently be transcribed and may not be corrected upon review.

## 2023-02-11 NOTE — Progress Notes (Signed)
 Palliative Medicine [facility]  Telephone:([phone] Fax:(336) [phone]   Name: Jeffery Fernandez Date: 02/11/2023 MRN: [id number]  DOB: 03-06-1945  Patient Care Team: Pura Lenis, MD as PCP - General (Family Medicine) Raford Riggs, MD as PCP - Cardiology (Cardiology) Pickenpack-Cousar, Fannie SAILOR, NP as Nurse Practitioner (Hospice and Palliative Medicine) Pickenpack-Cousar, Fannie SAILOR, NP as Nurse Practitioner (Hospice and Palliative Medicine)    INTERVAL HISTORY: Jeffery Fernandez is a 78 y.o. 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 As well as COPD, DM type 2, arthritis, hypertension, and hyperlipidemia. Palliative ask to see for symptom management and goals of care.   SOCIAL HISTORY:     reports that he quit smoking about 37 years ago. His smoking use included cigarettes. He started smoking about 62 years ago. He has a 37.5 pack-year smoking history. He has never used smokeless tobacco. He reports that he does not currently use alcohol. He reports that he does not use drugs.  ADVANCE DIRECTIVES:  Advanced directives on file name Jeffery Fernandez as the primary and Jeffery Fernandez as secondary decision makers should the patient become unable to make his own decisions.  CODE STATUS: Full code  PAST MEDICAL HISTORY: Past Medical History:  Diagnosis Date   Arrhythmia    Arthritis    Cataract    bilateral cataract extraction with intraoccular lens implants   Chest pain, unspecified    Chronic airway obstruction, not elsewhere classified    PT. DENIES HE HAS COPD   CKD (chronic kidney disease)    Colon polyps    Adenomatous Polyps 2007   Depression    Diabetes mellitus    Diverticulosis    Gout    Heart murmur    History of kidney stones    Hyperlipemia    Hypertension    Nephrolithiasis    Normocytic anemia 04/23/2017   Obstructive sleep apnea (adult) (pediatric)    Other and unspecified hyperlipidemia     Other left bundle branch block    Retinal vascular occlusion, unspecified    right   Shortness of breath    on exertion   Unspecified sleep apnea    Vision loss of right eye    White coat syndrome with diagnosis of hypertension 09/19/2017    ALLERGIES:  is allergic to amlodipine , penicillins, and hydralazine .  MEDICATIONS:  Current Outpatient Medications  Medication Sig Dispense Refill   acetaminophen  (TYLENOL ) 500 MG tablet Take 1,000 mg by mouth every 6 (six) hours as needed (for pain.).     aspirin  EC 81 MG tablet Take 81 mg by mouth daily. Swallow whole.     atorvastatin  (LIPITOR) 40 MG tablet Take 40 mg by mouth at bedtime.     buPROPion  (WELLBUTRIN  XL) 150 MG 24 hr tablet Take 150 mg by mouth every morning.     busPIRone (BUSPAR) 10 MG tablet Take 10 mg by mouth in the morning.     Dulaglutide  (TRULICITY ) 1.5 MG/0.5ML SOAJ Inject 1.5 mg into the skin daily. Takes on Thursdays     gabapentin  (NEURONTIN ) 100 MG capsule Take 1 capsule (100 mg total) by mouth daily.     HYDROcodone -acetaminophen  (NORCO/VICODIN) 5-325 MG tablet Take 1 tablet by mouth daily as needed for moderate pain (pain score 4-6) or severe pain (pain score 7-10).     insulin  glargine (LANTUS ) 100 UNIT/ML injection Inject 40 Units into the skin in the morning.     sertraline  (ZOLOFT ) 100 MG  tablet Take 100 mg by mouth in the morning.     torsemide  (DEMADEX ) 20 MG tablet Take 2 tablets (40 mg total) by mouth daily. (Patient taking differently: Take 40 mg by mouth in the morning.) 60 tablet 0   No current facility-administered medications for this visit.    VITAL SIGNS: There were no vitals taken for this visit. There were no vitals filed for this visit.  Estimated body mass index is 27.73 kg/m as calculated from the following:   Height as of 02/05/23: 5' 9 (1.753 m).   Weight as of 02/05/23: 187 lb 12.8 oz (85.2 kg).   PERFORMANCE STATUS (ECOG) : 1 - Symptomatic but completely ambulatory   Physical  Exam General: NAD Cardiovascular: regular rate and rhythm Pulmonary: clear ant fields Abdomen: soft, nontender, + bowel sounds Extremities: no edema, no joint deformities Skin: no rashes Neurological: AAOx3  IMPRESSION:  [name] presents to the clinic for follow-up. His family is present. No acute distress. Patient reports appreciation of completing radiation on yesterday. He is under the care of Dr. Gatha. Reports ongoing issues of nausea and sleep disturbances. Despite the use of Compazine  for nausea and Tylenol  PM for sleep, the patient reports no significant improvement in symptoms. He is scheduled to start chemotherapy next week and expresses concern about potential side effects.  The patient's appetite has significantly decreased, with frequent episodes of vomiting and inability to keep food down. This has been ongoing since the completion of radiation therapy. There has been a slight weight loss, from 187 to 184 pounds. We discussed ways to increase nutrition and protein enriched foods. Encouraged to focus on small frequent meals versus trying to consume large meals.   [name] and family reports his back pain is well controlled. He is currently on gabapentin , taken once daily, and hydrocodone  as needed for pain. The patient reports leg weakness and fatigue which he associates with recent radiation treatments.   Blain continues to struggle with insomnia despite use of different medications to assist including Tylenol  PM and Melatonin. We reviewed his medications at length to confirm use of Zoloft  and Wellbutrin  for depression, and Buspar for anxiety, though there is uncertainty about the effectiveness of the latter. Unfortunately, this limits options for prescription medications to assist in sleep. The patient and family expresses willingness to discontinue Buspar in order to gain some sleep aid. Given decreased appetite I discussed at length use of mirtazapine to assist with both  insomnia and appetite. Education provided on use, efficacy, and potential side effects. Patient and family verbalized understanding and appreciation.  We discussed Her current illness and what it means in the larger context of Her on-going co-morbidities. Natural disease trajectory and expectations were discussed.  I discussed the importance of continued conversation with family and their medical providers regarding overall plan of care and treatment options, ensuring decisions are within the context of the patients values and GOCs.  Assessment and Plan  Insomnia Patient reports difficulty sleeping, with Tylenol  PM or melatonin not providing relief. -Review patient's current medications and interactions, specifically with Zoloft , Buspar, and Wellbutrin . -Prescribe new sleep aid after thorough review, ensuring no interactions with current medications.  Cancer-related Nausea Patient experiencing nausea, with Compazine  prescribed by Dr. Gatha. -Continue Compazine  as prescribed.  Cancer-related Anorexia Patient reports poor appetite and weight loss. -Consider appetite stimulant after reviewing potential interactions with current medications.  Cancer-related Pain Patient reports pain is well controlled on current regimen. No changes at this time.  -Continue Hydrocodone   and Gabapentin  as prescribed.   Chemotherapy Patient to start chemotherapy next week. -Ensure patient attends chemotherapy education session. -Advise patient to contact office for any issues arising post-treatment.  Follow-up -Schedule follow-up appointment in two weeks. -Check-in with patient later today regarding sleep aid prescription. -Advise patient to contact office anytime if issues arise/worsening symptoms.  Patient expressed understanding and was in agreement with this plan. He also understands that He can call the clinic at any time with any questions, concerns, or complaints.   Any controlled substances  utilized were prescribed in the context of palliative care. PDMP has been reviewed.   Visit consisted of counseling and education dealing with the complex and emotionally intense issues of symptom management and palliative care in the setting of serious and potentially life-threatening illness.  Levon Borer, AGPCNP-BC  Palliative Medicine Team/[facility]

## 2023-02-11 NOTE — Radiation Completion Notes (Addendum)
  Radiation Oncology         [phone] ________________________________  Name: Jeffery Fernandez MRN: [id number]  Date of Service: 02/10/2023  DOB: August 07, 1945  End of Treatment Note  Diagnosis: Stage IV, NSCLC, adenocarcinoma of the RUL with T10 metastasis.  Intent: Palliative     ==========DELIVERED PLANS==========  First Treatment Date: 2023-01-27 Last Treatment Date: 2023-02-10   Plan Name: Spine_T Site: Thoracic Spine Technique: 3D Mode: Photon Dose Per Fraction: 3 Gy Prescribed Dose (Delivered / Prescribed): 30 Gy / 30 Gy Prescribed Fxs (Delivered / Prescribed): 10 / 10     ==========ON TREATMENT VISIT DATES========== 2023-01-28, 2023-02-04    See weekly On Treatment Notes in Epic for details in the Media tab (listed as Progress notes on the On Treatment Visit Dates listed above). The patient tolerated radiation. He did not complain of fatigue, skin changes, or pain at the conclusion of treatment.   The patient will follow up in 1 month in our clinic. He will continue follow up with Dr. Sherrod as well.      Donald KYM Husband, PAC

## 2023-02-12 ENCOUNTER — Ambulatory Visit: Payer: HMO

## 2023-02-12 ENCOUNTER — Encounter: Payer: Self-pay | Admitting: Internal Medicine

## 2023-02-17 ENCOUNTER — Inpatient Hospital Stay: Payer: HMO

## 2023-02-17 DIAGNOSIS — Z5112 Encounter for antineoplastic immunotherapy: Secondary | ICD-10-CM | POA: Diagnosis not present

## 2023-02-17 DIAGNOSIS — C3411 Malignant neoplasm of upper lobe, right bronchus or lung: Secondary | ICD-10-CM

## 2023-02-17 LAB — CBC WITH DIFFERENTIAL (CANCER CENTER ONLY)
Abs Immature Granulocytes: 0.05 10*3/uL (ref 0.00–0.07)
Basophils Absolute: 0.1 10*3/uL (ref 0.0–0.1)
Basophils Relative: 1 %
Eosinophils Absolute: 0.2 10*3/uL (ref 0.0–0.5)
Eosinophils Relative: 2 %
HCT: 33.1 % — ABNORMAL LOW (ref 39.0–52.0)
Hemoglobin: 10.7 g/dL — ABNORMAL LOW (ref 13.0–17.0)
Immature Granulocytes: 1 %
Lymphocytes Relative: 11 %
Lymphs Abs: 1 10*3/uL (ref 0.7–4.0)
MCH: 30 pg (ref 26.0–34.0)
MCHC: 32.3 g/dL (ref 30.0–36.0)
MCV: 92.7 fL (ref 80.0–100.0)
Monocytes Absolute: 0.8 10*3/uL (ref 0.1–1.0)
Monocytes Relative: 9 %
Neutro Abs: 6.9 10*3/uL (ref 1.7–7.7)
Neutrophils Relative %: 76 %
Platelet Count: 209 10*3/uL (ref 150–400)
RBC: 3.57 MIL/uL — ABNORMAL LOW (ref 4.22–5.81)
RDW: 14.9 % (ref 11.5–15.5)
WBC Count: 8.9 10*3/uL (ref 4.0–10.5)
nRBC: 0 % (ref 0.0–0.2)

## 2023-02-17 LAB — CMP (CANCER CENTER ONLY)
ALT: 6 U/L (ref 0–44)
AST: 11 U/L — ABNORMAL LOW (ref 15–41)
Albumin: 3.6 g/dL (ref 3.5–5.0)
Alkaline Phosphatase: 109 U/L (ref 38–126)
Anion gap: 9 (ref 5–15)
BUN: 24 mg/dL — ABNORMAL HIGH (ref 8–23)
CO2: 29 mmol/L (ref 22–32)
Calcium: 8.8 mg/dL — ABNORMAL LOW (ref 8.9–10.3)
Chloride: 101 mmol/L (ref 98–111)
Creatinine: 2.9 mg/dL — ABNORMAL HIGH (ref 0.61–1.24)
GFR, Estimated: 21 mL/min — ABNORMAL LOW (ref >60)
Glucose, Bld: 167 mg/dL — ABNORMAL HIGH (ref 70–99)
Potassium: 3.7 mmol/L (ref 3.5–5.1)
Sodium: 139 mmol/L (ref 135–145)
Total Bilirubin: 0.3 mg/dL (ref 0.0–1.2)
Total Protein: 7.7 g/dL (ref 6.5–8.1)

## 2023-02-17 MED FILL — Fosaprepitant Dimeglumine For IV Infusion 150 MG (Base Eq): INTRAVENOUS | Qty: 5 | Status: AC

## 2023-02-18 ENCOUNTER — Inpatient Hospital Stay: Payer: HMO

## 2023-02-18 ENCOUNTER — Other Ambulatory Visit: Payer: Self-pay | Admitting: Urology

## 2023-02-18 VITALS — BP 163/82 | HR 99 | Temp 98.3°F | Resp 16 | Wt 185.8 lb

## 2023-02-18 DIAGNOSIS — C3411 Malignant neoplasm of upper lobe, right bronchus or lung: Secondary | ICD-10-CM

## 2023-02-18 DIAGNOSIS — Z01818 Encounter for other preprocedural examination: Secondary | ICD-10-CM

## 2023-02-18 DIAGNOSIS — Z5112 Encounter for antineoplastic immunotherapy: Secondary | ICD-10-CM | POA: Diagnosis not present

## 2023-02-18 LAB — TSH: TSH: 2.523 u[IU]/mL (ref 0.350–4.500)

## 2023-02-18 LAB — T4: T4, Total: 7.3 ug/dL (ref 4.5–12.0)

## 2023-02-18 MED ORDER — FAMOTIDINE IN NACL 20-0.9 MG/50ML-% IV SOLN
20.0000 mg | Freq: Once | INTRAVENOUS | Status: AC
  Administered 2023-02-18: 20 mg via INTRAVENOUS
  Filled 2023-02-18: qty 50

## 2023-02-18 MED ORDER — SODIUM CHLORIDE 0.9 % IV SOLN
150.0000 mg | Freq: Once | INTRAVENOUS | Status: AC
  Administered 2023-02-18: 150 mg via INTRAVENOUS
  Filled 2023-02-18: qty 150

## 2023-02-18 MED ORDER — DEXAMETHASONE SODIUM PHOSPHATE 10 MG/ML IJ SOLN
10.0000 mg | Freq: Once | INTRAMUSCULAR | Status: AC
  Administered 2023-02-18: 10 mg via INTRAVENOUS
  Filled 2023-02-18: qty 1

## 2023-02-18 MED ORDER — NIVOLUMAB CHEMO INJECTION 100 MG/10ML
360.0000 mg | Freq: Once | INTRAVENOUS | Status: AC
  Administered 2023-02-18: 360 mg via INTRAVENOUS
  Filled 2023-02-18: qty 36

## 2023-02-18 MED ORDER — SODIUM CHLORIDE 0.9 % IV SOLN
1.0000 mg/kg | Freq: Once | INTRAVENOUS | Status: AC
Start: 2023-02-18 — End: 2023-02-18
  Administered 2023-02-18: 85 mg via INTRAVENOUS
  Filled 2023-02-18: qty 17

## 2023-02-18 MED ORDER — PALONOSETRON HCL INJECTION 0.25 MG/5ML
0.2500 mg | Freq: Once | INTRAVENOUS | Status: AC
  Administered 2023-02-18: 0.25 mg via INTRAVENOUS
  Filled 2023-02-18: qty 5

## 2023-02-18 MED ORDER — SODIUM CHLORIDE 0.9 % IV SOLN
258.0000 mg | Freq: Once | INTRAVENOUS | Status: AC
  Administered 2023-02-18: 260 mg via INTRAVENOUS
  Filled 2023-02-18: qty 26

## 2023-02-18 MED ORDER — SODIUM CHLORIDE 0.9 % IV SOLN: INTRAVENOUS | Status: DC

## 2023-02-18 MED ORDER — DIPHENHYDRAMINE HCL 50 MG/ML IJ SOLN
25.0000 mg | Freq: Once | INTRAMUSCULAR | Status: AC
  Administered 2023-02-18: 25 mg via INTRAVENOUS
  Filled 2023-02-18: qty 1

## 2023-02-18 MED ORDER — SODIUM CHLORIDE 0.9 % IV SOLN
175.0000 mg/m2 | Freq: Once | INTRAVENOUS | Status: AC
  Administered 2023-02-18: 354 mg via INTRAVENOUS
  Filled 2023-02-18: qty 59

## 2023-02-18 MED ORDER — DIPHENHYDRAMINE HCL 50 MG/ML IJ SOLN
50.0000 mg | Freq: Once | INTRAMUSCULAR | Status: DC
Start: 2023-02-18 — End: 2023-02-18

## 2023-02-18 NOTE — Progress Notes (Signed)
Per Dr. Arbutus Ped, OK to treat today with Cr 2.9.

## 2023-02-18 NOTE — Progress Notes (Signed)
Ipilimumab (YERVOY) Patient Monitoring Assessment   Is the patient experiencing any of the following general symptoms?:  [] Difficulty performing normal activities [x] Feeling sluggish or cold all the time (reports "kinda sluggish") [ ] Unusual weight gain [ ] Constant or unusual headaches [ ] Feeling dizzy or faint [ ] Changes in eyesight *(blurry vision, double vision, or other vision problems) (pt only has vision in left eye) [ ] Changes in mood or behavior (ex: decreased sex drive, irritability, or forgetfulness)*(reports feeling "more anxious") [ ] Starting new medications (ex: steroids, other medications that lower immune response) [x] Patient is not experiencing any of the general symptoms above.   Gastrointestinal  Patient is having 1 bowel movements each day.  Is this different from baseline? [ ] Yes [x] No Are your stools watery or do they have a foul smell? [ ] Yes [ ] No Have you seen blood in your stools? [ ] Yes [x] No Are your stools dark, tarry, or sticky? [ ] Yes [x] No Are you having pain or tenderness in your belly? [ ] Yes [x] No  Skin Does your skin itch? [ ] Yes [x] No Do you have a rash? [ ] Yes [x] No Has your skin blistered and/or peeled? [ ] Yes [x] No Do you have sores in your mouth? [ ] Yes [x] No  Hepatic Has your urine been dark or tea colored? [ ] Yes [x] No Have you noticed that your skin or the whites of your eyes are turning yellow? [ ] Yes [x] No Are you bleeding or bruising more easily than normal? [ ] Yes [ ] No *(pt experiences easy bruising on arms) Are you nauseous and/or vomiting? [x] Yes [ ] No Do you have pain on the right side of your stomach? [ ] Yes [x] No  Neurologic  Are you having unusual weakness of legs, arms, or face? [x] Yes [ ] No *(reports weakness in legs for several months; slightly weaker in right leg) Are you having numbness or tingling in your hands or feet? [ ] Yes [x] No *(reports cold on the ends of toes, but no tingling)  Jeffery Fernandez

## 2023-02-18 NOTE — Patient Instructions (Signed)
[facility] - A DEPT OF [facility]  Discharge Instructions: Thank you for choosing [facility] to provide your oncology and hematology care.   If you have a lab appointment with the Cancer Center, please go directly to the Cancer Center and check in at the registration area.   Wear comfortable clothing and clothing appropriate for easy access to any Portacath or PICC line.   We strive to give you quality time with your provider. You may need to reschedule your appointment if you arrive late (15 or more minutes).  Arriving late affects you and other patients whose appointments are after yours.  Also, if you miss three or more appointments without notifying the office, you may be dismissed from the clinic at the provider's discretion.      For prescription refill requests, have your pharmacy contact our office and allow 72 hours for refills to be completed.    Today you received the following chemotherapy and/or immunotherapy agents: Nivolumab, Ipilimumab, Paclitaxel, Carboplatin      To help prevent nausea and vomiting after your treatment, we encourage you to take your nausea medication as directed.  BELOW ARE SYMPTOMS THAT SHOULD BE REPORTED IMMEDIATELY: *FEVER GREATER THAN 100.4 F (38 C) OR HIGHER *CHILLS OR SWEATING *NAUSEA AND VOMITING THAT IS NOT CONTROLLED WITH YOUR NAUSEA MEDICATION *UNUSUAL SHORTNESS OF BREATH *UNUSUAL BRUISING OR BLEEDING *URINARY PROBLEMS (pain or burning when urinating, or frequent urination) *BOWEL PROBLEMS (unusual diarrhea, constipation, pain near the anus) TENDERNESS IN MOUTH AND THROAT WITH OR WITHOUT PRESENCE OF ULCERS (sore throat, sores in mouth, or a toothache) UNUSUAL RASH, SWELLING OR PAIN  UNUSUAL VAGINAL DISCHARGE OR ITCHING   Items with * indicate a potential emergency and should be followed up as soon as possible or go to the Emergency Department if any problems should occur.  Please show the  CHEMOTHERAPY ALERT CARD or IMMUNOTHERAPY ALERT CARD at check-in to the Emergency Department and triage nurse.  Should you have questions after your visit or need to cancel or reschedule your appointment, please contact [facility] - A DEPT OF [facility]  Dept: [phone]  and follow the prompts.  Office hours are 8:00 a.m. to 4:30 p.m. Monday - Friday. Please note that voicemails left after 4:00 p.m. may not be returned until the following business day.  We are closed weekends and major holidays. You have access to a nurse at all times for urgent questions. Please call the main number to the clinic Dept: [phone] and follow the prompts.   For any non-urgent questions, you may also contact your provider using MyChart. We now offer e-Visits for anyone 18 and older to request care online for non-urgent symptoms. For details visit [email or web address].   Also download the MyChart app! Go to the app store, search "MyChart", open the app, select [place], and log in with your MyChart username and password.  Nivolumab Injection What is this medication? NIVOLUMAB (nye VOL ue mab) treats some types of cancer. It works by helping your immune system slow or stop the spread of cancer cells. It is a monoclonal antibody. This medicine may be used for other purposes; ask your health care provider or pharmacist if you have questions. COMMON BRAND NAME(S): Opdivo What should I tell my care team before I take this medication? They need to know if you have any of these conditions: Allogeneic stem cell transplant (uses someone else's  stem cells) Autoimmune diseases, such as Crohn disease, ulcerative colitis, lupus History of chest radiation Nervous system problems, such as Guillain-Barre syndrome or myasthenia gravis Organ transplant An unusual or allergic reaction to nivolumab, other medications, foods, dyes, or preservatives Pregnant or trying to get  pregnant Breast-feeding How should I use this medication? This medication is infused into a vein. It is given in a hospital or clinic setting. A special MedGuide will be given to you before each treatment. Be sure to read this information carefully each time. Talk to your care team about the use of this medication in children. While it may be prescribed for children as young as 12 years for selected conditions, precautions do apply. Overdosage: If you think you have taken too much of this medicine contact a poison control center or emergency room at once. NOTE: This medicine is only for you. Do not share this medicine with others. What if I miss a dose? Keep appointments for follow-up doses. It is important not to miss your dose. Call your care team if you are unable to keep an appointment. What may interact with this medication? Interactions have not been studied. This list may not describe all possible interactions. Give your health care provider a list of all the medicines, herbs, non-prescription drugs, or dietary supplements you use. Also tell them if you smoke, drink alcohol, or use illegal drugs. Some items may interact with your medicine. What should I watch for while using this medication? Your condition will be monitored carefully while you are receiving this medication. You may need blood work while taking this medication. This medication may cause serious skin reactions. They can happen weeks to months after starting the medication. Contact your care team right away if you notice fevers or flu-like symptoms with a rash. The rash may be red or purple and then turn into blisters or peeling of the skin. You may also notice a red rash with swelling of the face, lips, or lymph nodes in your neck or under your arms. Tell your care team right away if you have any change in your eyesight. Talk to your care team if you are pregnant or think you might be pregnant. A negative pregnancy test is  required before starting this medication. A reliable form of contraception is recommended while taking this medication and for 5 months after the last dose. Talk to your care team about effective forms of contraception. Do not breast-feed while taking this medication and for 5 months after the last dose. What side effects may I notice from receiving this medication? Side effects that you should report to your care team as soon as possible: Allergic reactions--skin rash, itching, hives, swelling of the face, lips, tongue, or throat Dry cough, shortness of breath or trouble breathing Eye pain, redness, irritation, or discharge with blurry or decreased vision Heart muscle inflammation--unusual weakness or fatigue, shortness of breath, chest pain, fast or irregular heartbeat, dizziness, swelling of the ankles, feet, or hands Hormone gland problems--headache, sensitivity to light, unusual weakness or fatigue, dizziness, fast or irregular heartbeat, increased sensitivity to cold or heat, excessive sweating, constipation, hair loss, increased thirst or amount of urine, tremors or shaking, irritability Infusion reactions--chest pain, shortness of breath or trouble breathing, feeling faint or lightheaded Kidney injury (glomerulonephritis)--decrease in the amount of urine, red or dark brown urine, foamy or bubbly urine, swelling of the ankles, hands, or feet Liver injury--right upper belly pain, loss of appetite, nausea, light-colored stool, dark yellow or brown urine,  yellowing skin or eyes, unusual weakness or fatigue Pain, tingling, or numbness in the hands or feet, muscle weakness, change in vision, confusion or trouble speaking, loss of balance or coordination, trouble walking, seizures Rash, fever, and swollen lymph nodes Redness, blistering, peeling, or loosening of the skin, including inside the mouth Sudden or severe stomach pain, bloody diarrhea, fever, nausea, vomiting Side effects that usually do  not require medical attention (report these to your care team if they continue or are bothersome): Bone, joint, or muscle pain Diarrhea Fatigue Loss of appetite Nausea Skin rash This list may not describe all possible side effects. Call your doctor for medical advice about side effects. You may report side effects to FDA at 1-800-FDA-1088. Where should I keep my medication? This medication is given in a hospital or clinic. It will not be stored at home. NOTE: This sheet is a summary. It may not cover all possible information. If you have questions about this medicine, talk to your doctor, pharmacist, or health care provider.  2024 Elsevier/Gold Standard (2021-05-14 00:00:00)  Ipilimumab Injection What is this medication? IPILIMUMAB (IP i LIM ue mab) treats some types of cancer. It works by helping your immune system slow or stop the spread of cancer cells. It is a monoclonal antibody. This medicine may be used for other purposes; ask your health care provider or pharmacist if you have questions. COMMON BRAND NAME(S): YERVOY What should I tell my care team before I take this medication? They need to know if you have any of these conditions: Allogeneic stem cell transplant (uses someone else's stem cells) Autoimmune diseases, such as Crohn disease, ulcerative colitis, lupus Nervous system problems, such as Guillain-Barre syndrome or myasthenia gravis Organ transplant An unusual or allergic reaction to ipilimumab, other medications, foods, dyes, or preservatives Pregnant or trying to get pregnant Breast-feeding How should I use this medication? This medication is infused into a vein. It is given by your care team in a hospital or clinic setting. A special MedGuide will be given to you before each treatment. Be sure to read this information carefully each time. Talk to your care team about the use of this medication in children. While it may be prescribed for children as young as 12 years  for selected conditions, precautions do apply. Overdosage: If you think you have taken too much of this medicine contact a poison control center or emergency room at once. NOTE: This medicine is only for you. Do not share this medicine with others. What if I miss a dose? Keep appointments for follow-up doses. It is important not to miss your dose. Call your care team if you are unable to keep an appointment. What may interact with this medication? Interactions are not expected. This list may not describe all possible interactions. Give your health care provider a list of all the medicines, herbs, non-prescription drugs, or dietary supplements you use. Also tell them if you smoke, drink alcohol, or use illegal drugs. Some items may interact with your medicine. What should I watch for while using this medication? Your condition will be monitored carefully while you are receiving this medication. You may need blood work while taking this medication. This medication may cause serious skin reactions. They can happen weeks to months after starting the medication. Contact your care team right away if you notice fevers or flu-like symptoms with a rash. The rash may be red or purple and then turn into blisters or peeling of the skin. You may also notice  a red rash with swelling of the face, lips, or lymph nodes in your neck or under your arms. Tell your care team right away if you have any change in your eyesight. Talk to your care team if you may be pregnant. Serious birth defects can occur if you take this medication during pregnancy and for 3 months after the last dose. You will need a negative pregnancy test before starting this medication. Contraception is recommended while taking this medication and for 3 months after the last dose. Your care team can help you find the option that works for you. Do not breastfeed while taking this medication and for 3 months after the last dose. What side effects may I  notice from receiving this medication? Side effects that you should report to your care team as soon as possible: Allergic reactions--skin rash, itching, hives, swelling of the face, lips, tongue, or throat Dry cough, shortness of breath or trouble breathing Eye pain, redness, irritation, or discharge with blurry or decreased vision Heart muscle inflammation--unusual weakness or fatigue, shortness of breath, chest pain, fast or irregular heartbeat, dizziness, swelling of the ankles, feet, or hands Hormone gland problems--headache, sensitivity to light, unusual weakness or fatigue, dizziness, fast or irregular heartbeat, increased sensitivity to cold or heat, excessive sweating, constipation, hair loss, increased thirst or amount of urine, tremors or shaking, irritability Infusion reactions--chest pain, shortness of breath or trouble breathing, feeling faint or lightheaded Kidney injury (glomerulonephritis)--decrease in the amount of urine, red or dark brown urine, foamy or bubbly urine, swelling of the ankles, hands, or feet Liver injury--right upper belly pain, loss of appetite, nausea, light-colored stool, dark yellow or brown urine, yellowing skin or eyes, unusual weakness or fatigue Pain, tingling, or numbness in the hands or feet, muscle weakness, change in vision, confusion or trouble speaking, loss of balance or coordination, trouble walking, seizures Rash, fever, and swollen lymph nodes Redness, blistering, peeling, or loosening of the skin, including inside the mouth Sudden or severe stomach pain, bloody diarrhea, fever, nausea, vomiting Side effects that usually do not require medical attention (report to your care team if they continue or are bothersome): Bone, joint, or muscle pain Diarrhea Fatigue Loss of appetite Nausea Skin rash This list may not describe all possible side effects. Call your doctor for medical advice about side effects. You may report side effects to FDA at  1-800-FDA-1088. Where should I keep my medication? This medication is given in a hospital or clinic. It will not be stored at home. NOTE: This sheet is a summary. It may not cover all possible information. If you have questions about this medicine, talk to your doctor, pharmacist, or health care provider.  2024 Elsevier/Gold Standard (2021-06-01 00:00:00)  Paclitaxel Injection What is this medication? PACLITAXEL (PAK li TAX el) treats some types of cancer. It works by slowing down the growth of cancer cells. This medicine may be used for other purposes; ask your health care provider or pharmacist if you have questions. COMMON BRAND NAME(S): Onxol, Taxol What should I tell my care team before I take this medication? They need to know if you have any of these conditions: Heart disease Liver disease Low white blood cell levels An unusual or allergic reaction to paclitaxel, other medications, foods, dyes, or preservatives If you or your partner are pregnant or trying to get pregnant Breast-feeding How should I use this medication? This medication is injected into a vein. It is given by your care team in a hospital or clinic  setting. Talk to your care team about the use of this medication in children. While it may be given to children for selected conditions, precautions do apply. Overdosage: If you think you have taken too much of this medicine contact a poison control center or emergency room at once. NOTE: This medicine is only for you. Do not share this medicine with others. What if I miss a dose? Keep appointments for follow-up doses. It is important not to miss your dose. Call your care team if you are unable to keep an appointment. What may interact with this medication? Do not take this medication with any of the following: Live virus vaccines Other medications may affect the way this medication works. Talk with your care team about all of the medications you take. They may suggest  changes to your treatment plan to lower the risk of side effects and to make sure your medications work as intended. This list may not describe all possible interactions. Give your health care provider a list of all the medicines, herbs, non-prescription drugs, or dietary supplements you use. Also tell them if you smoke, drink alcohol, or use illegal drugs. Some items may interact with your medicine. What should I watch for while using this medication? Your condition will be monitored carefully while you are receiving this medication. You may need blood work while taking this medication. This medication may make you feel generally unwell. This is not uncommon as chemotherapy can affect healthy cells as well as cancer cells. Report any side effects. Continue your course of treatment even though you feel ill unless your care team tells you to stop. This medication can cause serious allergic reactions. To reduce the risk, your care team may give you other medications to take before receiving this one. Be sure to follow the directions from your care team. This medication may increase your risk of getting an infection. Call your care team for advice if you get a fever, chills, sore throat, or other symptoms of a cold or flu. Do not treat yourself. Try to avoid being around people who are sick. This medication may increase your risk to bruise or bleed. Call your care team if you notice any unusual bleeding. Be careful brushing or flossing your teeth or using a toothpick because you may get an infection or bleed more easily. If you have any dental work done, tell your dentist you are receiving this medication. Talk to your care team if you may be pregnant. Serious birth defects can occur if you take this medication during pregnancy. Talk to your care team before breastfeeding. Changes to your treatment plan may be needed. What side effects may I notice from receiving this medication? Side effects that you  should report to your care team as soon as possible: Allergic reactions--skin rash, itching, hives, swelling of the face, lips, tongue, or throat Heart rhythm changes--fast or irregular heartbeat, dizziness, feeling faint or lightheaded, chest pain, trouble breathing Increase in blood pressure Infection--fever, chills, cough, sore throat, wounds that don't heal, pain or trouble when passing urine, general feeling of discomfort or being unwell Low blood pressure--dizziness, feeling faint or lightheaded, blurry vision Low red blood cell level--unusual weakness or fatigue, dizziness, headache, trouble breathing Painful swelling, warmth, or redness of the skin, blisters or sores at the infusion site Pain, tingling, or numbness in the hands or feet Slow heartbeat--dizziness, feeling faint or lightheaded, confusion, trouble breathing, unusual weakness or fatigue Unusual bruising or bleeding Side effects that usually do not  require medical attention (report to your care team if they continue or are bothersome): Diarrhea Hair loss Joint pain Loss of appetite Muscle pain Nausea Vomiting This list may not describe all possible side effects. Call your doctor for medical advice about side effects. You may report side effects to FDA at 1-800-FDA-1088. Where should I keep my medication? This medication is given in a hospital or clinic. It will not be stored at home. NOTE: This sheet is a summary. It may not cover all possible information. If you have questions about this medicine, talk to your doctor, pharmacist, or health care provider.  2024 Elsevier/Gold Standard (2021-06-05 00:00:00)  Carboplatin Injection What is this medication? CARBOPLATIN (KAR boe pla tin) treats some types of cancer. It works by slowing down the growth of cancer cells. This medicine may be used for other purposes; ask your health care provider or pharmacist if you have questions. COMMON BRAND NAME(S): Paraplatin What should  I tell my care team before I take this medication? They need to know if you have any of these conditions: Blood disorders Hearing problems Kidney disease Recent or ongoing radiation therapy An unusual or allergic reaction to carboplatin, cisplatin, other medications, foods, dyes, or preservatives Pregnant or trying to get pregnant Breast-feeding How should I use this medication? This medication is injected into a vein. It is given by your care team in a hospital or clinic setting. Talk to your care team about the use of this medication in children. Special care may be needed. Overdosage: If you think you have taken too much of this medicine contact a poison control center or emergency room at once. NOTE: This medicine is only for you. Do not share this medicine with others. What if I miss a dose? Keep appointments for follow-up doses. It is important not to miss your dose. Call your care team if you are unable to keep an appointment. What may interact with this medication? Medications for seizures Some antibiotics, such as amikacin, gentamicin, neomycin, streptomycin, tobramycin Vaccines This list may not describe all possible interactions. Give your health care provider a list of all the medicines, herbs, non-prescription drugs, or dietary supplements you use. Also tell them if you smoke, drink alcohol, or use illegal drugs. Some items may interact with your medicine. What should I watch for while using this medication? Your condition will be monitored carefully while you are receiving this medication. You may need blood work while taking this medication. This medication may make you feel generally unwell. This is not uncommon, as chemotherapy can affect healthy cells as well as cancer cells. Report any side effects. Continue your course of treatment even though you feel ill unless your care team tells you to stop. In some cases, you may be given additional medications to help with side  effects. Follow all directions for their use. This medication may increase your risk of getting an infection. Call your care team for advice if you get a fever, chills, sore throat, or other symptoms of a cold or flu. Do not treat yourself. Try to avoid being around people who are sick. Avoid taking medications that contain aspirin, acetaminophen, ibuprofen, naproxen, or ketoprofen unless instructed by your care team. These medications may hide a fever. Be careful brushing or flossing your teeth or using a toothpick because you may get an infection or bleed more easily. If you have any dental work done, tell your dentist you are receiving this medication. Talk to your care team if you wish  to become pregnant or think you might be pregnant. This medication can cause serious birth defects. Talk to your care team about effective forms of contraception. Do not breast-feed while taking this medication. What side effects may I notice from receiving this medication? Side effects that you should report to your care team as soon as possible: Allergic reactions--skin rash, itching, hives, swelling of the face, lips, tongue, or throat Infection--fever, chills, cough, sore throat, wounds that don't heal, pain or trouble when passing urine, general feeling of discomfort or being unwell Low red blood cell level--unusual weakness or fatigue, dizziness, headache, trouble breathing Pain, tingling, or numbness in the hands or feet, muscle weakness, change in vision, confusion or trouble speaking, loss of balance or coordination, trouble walking, seizures Unusual bruising or bleeding Side effects that usually do not require medical attention (report to your care team if they continue or are bothersome): Hair loss Nausea Unusual weakness or fatigue Vomiting This list may not describe all possible side effects. Call your doctor for medical advice about side effects. You may report side effects to FDA at  1-800-FDA-1088. Where should I keep my medication? This medication is given in a hospital or clinic. It will not be stored at home. NOTE: This sheet is a summary. It may not cover all possible information. If you have questions about this medicine, talk to your doctor, pharmacist, or health care provider.  2024 Elsevier/Gold Standard (2021-05-08 00:00:00)

## 2023-02-19 ENCOUNTER — Other Ambulatory Visit: Payer: Self-pay

## 2023-02-19 ENCOUNTER — Encounter (HOSPITAL_COMMUNITY): Payer: Self-pay

## 2023-02-19 ENCOUNTER — Telehealth: Payer: Self-pay

## 2023-02-19 ENCOUNTER — Ambulatory Visit (HOSPITAL_COMMUNITY)
Admission: RE | Admit: 2023-02-19 | Discharge: 2023-02-19 | Disposition: A | Discharge status: Home / Self Care | Payer: HMO | Source: Ambulatory Visit | Attending: Internal Medicine

## 2023-02-19 ENCOUNTER — Ambulatory Visit (HOSPITAL_COMMUNITY)
Admission: RE | Admit: 2023-02-19 | Discharge: 2023-02-19 | Disposition: A | Discharge status: Home / Self Care | Payer: HMO | Source: Ambulatory Visit | Attending: Internal Medicine | Admitting: Internal Medicine

## 2023-02-19 DIAGNOSIS — C3411 Malignant neoplasm of upper lobe, right bronchus or lung: Secondary | ICD-10-CM | POA: Diagnosis present

## 2023-02-19 DIAGNOSIS — Z87891 Personal history of nicotine dependence: Secondary | ICD-10-CM | POA: Diagnosis not present

## 2023-02-19 HISTORY — PX: IR IMAGING GUIDED PORT INSERTION: IMG5740

## 2023-02-19 LAB — GLUCOSE, CAPILLARY: Glucose-Capillary: 163 mg/dL — ABNORMAL HIGH (ref 70–99)

## 2023-02-19 MED ORDER — ONDANSETRON HCL 4 MG/2ML IJ SOLN: 4.0000 mg | Freq: Once | INTRAMUSCULAR | Status: DC

## 2023-02-19 MED ORDER — LIDOCAINE-EPINEPHRINE 1 %-1:100000 IJ SOLN
20.0000 mL | Freq: Once | INTRAMUSCULAR | Status: AC
Start: 2023-02-19 — End: 2023-02-19
  Administered 2023-02-19: 15 mL via INTRADERMAL

## 2023-02-19 MED ORDER — MIDAZOLAM HCL 2 MG/2ML IJ SOLN
INTRAMUSCULAR | Status: AC
Start: 2023-02-19 — End: ?
  Filled 2023-02-19: qty 2

## 2023-02-19 MED ORDER — HEPARIN SOD (PORK) LOCK FLUSH 100 UNIT/ML IV SOLN
500.0000 [IU] | Freq: Once | INTRAVENOUS | Status: AC
Start: 2023-02-19 — End: 2023-02-19
  Administered 2023-02-19: 500 [IU] via INTRAVENOUS

## 2023-02-19 MED ORDER — LIDOCAINE-EPINEPHRINE 1 %-1:100000 IJ SOLN
INTRAMUSCULAR | Status: AC
  Filled 2023-02-19: qty 1

## 2023-02-19 MED ORDER — MIDAZOLAM HCL 2 MG/2ML IJ SOLN
INTRAMUSCULAR | Status: AC | PRN
  Administered 2023-02-19 (×2): 1 mg via INTRAVENOUS

## 2023-02-19 MED ORDER — FENTANYL CITRATE (PF) 100 MCG/2ML IJ SOLN
INTRAMUSCULAR | Status: AC | PRN
  Administered 2023-02-19 (×2): 50 ug via INTRAVENOUS

## 2023-02-19 MED ORDER — FENTANYL CITRATE (PF) 100 MCG/2ML IJ SOLN
INTRAMUSCULAR | Status: AC
  Filled 2023-02-19: qty 2

## 2023-02-19 MED ORDER — HEPARIN SOD (PORK) LOCK FLUSH 100 UNIT/ML IV SOLN
INTRAVENOUS | Status: AC
  Filled 2023-02-19: qty 5

## 2023-02-19 MED ORDER — PROMETHAZINE (PHENERGAN) 6.25MG IN NS 50ML IVPB
6.2500 mg | Freq: Once | INTRAVENOUS | Status: DC
  Filled 2023-02-19: qty 50

## 2023-02-19 NOTE — Telephone Encounter (Signed)
Daughter Rosey Bath states that Mr Jeffery Fernandez is doing fine. He is eating, drinking, and urinating well. They know to call the office at [phone] if they has any questions or concerns.

## 2023-02-19 NOTE — H&P (Signed)
Chief Complaint: Patient was seen in consultation today for LN biopsy   Referring Physician(s): Mohamed,Mohamed  Supervising Physician:  Dr. Grace Isaac  Patient Status: [facility]- Outpt  History of Present Illness: Jeffery Fernandez is a 78 y.o. male with lung cancer. Hypermetabolic adenopathy found on PET scan. Pt underwent biopsy of supraclavicular LN on 12/17 proving malignant process  He is now referred for port placement  PMHx, meds, labs, imaging, allergies reviewed. Feels well, no recent fevers, chills, illness. Has been NPO today as directed. Family at bedside.   Past Medical History:  Diagnosis Date   Arrhythmia    Arthritis    Cataract    bilateral cataract extraction with intraoccular lens implants   Chest pain, unspecified    Chronic airway obstruction, not elsewhere classified    PT. DENIES HE HAS COPD   CKD (chronic kidney disease)    Colon polyps    Adenomatous Polyps 2007   Depression    Diabetes mellitus    Diverticulosis    Gout    Heart murmur    History of kidney stones    Hyperlipemia    Hypertension    Nephrolithiasis    Normocytic anemia 04/23/2017   Obstructive sleep apnea (adult) (pediatric)    Other and unspecified hyperlipidemia    Other left bundle branch block    Retinal vascular occlusion, unspecified    right   Shortness of breath    on exertion   Unspecified sleep apnea    Vision loss of right eye    White coat syndrome with diagnosis of hypertension 09/19/2017    Past Surgical History:  Procedure Laterality Date   ANTERIOR CERVICAL DECOMP/DISCECTOMY FUSION N/A 06/26/2016   Procedure: Re-Exploration of Cervical Wound;  Surgeon: Donalee Citrin, MD;  Location: [facility] OR;  Service: Neurosurgery;  Laterality: N/A;   ANTERIOR CERVICAL DECOMP/DISCECTOMY FUSION N/A 06/26/2016   Procedure: ANTERIOR CERVICAL DECOMPRESSION/DISCECTOMY FUSION, INTERBODY PROSTESIS, PLATE, CERVICAL THREE CERVICAL FOUR, CERVICAL FOUR CERVICAL FIVE CERVICAL SIX;  Surgeon:  Tressie Stalker, MD;  Location: [facility] OR;  Service: Neurosurgery;  Laterality: N/A;   APPLICATION OF WOUND VAC  06/05/2020   Procedure: APPLICATION OF WOUND VAC;  Surgeon: Tressie Stalker, MD;  Location: [facility] OR;  Service: Neurosurgery;;   BACK SURGERY     CATARACT EXTRACTION Bilateral    KIDNEY STONE SURGERY     KYPHOPLASTY N/A 01/19/2018   Procedure: KYPHOPLASTY LUMBAR THREE;  Surgeon: Tressie Stalker, MD;  Location: [facility] OR;  Service: Neurosurgery;  Laterality: N/A;  KYPHOPLASTY LUMBAR THREE   LUMBAR WOUND DEBRIDEMENT N/A 06/05/2020   Procedure: LUMBAR WOUND DEBRIDEMENT;  Surgeon: Tressie Stalker, MD;  Location: [facility] OR;  Service: Neurosurgery;  Laterality: N/A;    Allergies: Amlodipine, Penicillins, and Hydralazine  Medications: Prior to Admission medications   Medication Sig Start Date End Date Taking? Authorizing Provider  acetaminophen (TYLENOL) 500 MG tablet Take 1,000 mg by mouth every 6 (six) hours as needed (for pain.).   Yes [provider]  aspirin EC 81 MG tablet Take 81 mg by mouth daily. Swallow whole.   Yes [provider]  atorvastatin (LIPITOR) 40 MG tablet Take 40 mg by mouth at bedtime.   Yes [provider]  buPROPion (WELLBUTRIN XL) 150 MG 24 hr tablet Take 150 mg by mouth every morning.   Yes [provider]  busPIRone (BUSPAR) 10 MG tablet Take 10 mg by mouth in the morning. 12/11/22  Yes [provider]  gabapentin (NEURONTIN) 100 MG capsule Take 1  capsule (100 mg total) by mouth daily. 01/02/23  Yes Laretta Bolster, MD  insulin glargine (LANTUS) 100 UNIT/ML injection Inject 40 Units into the skin in the morning.   Yes [provider]  sertraline (ZOLOFT) 100 MG tablet Take 100 mg by mouth in the morning.   Yes [provider]  torsemide (DEMADEX) 20 MG tablet Take 2 tablets (40 mg total) by mouth daily. Patient taking differently: Take 40 mg by mouth in the morning. 12/04/22 01/14/23 Yes [name],  MD  Dulaglutide (TRULICITY) 1.5 MG/0.5ML SOAJ Inject 1.5 mg into the skin daily. Takes on Thursdays 10/01/22   [provider]  Turmeric (QC TUMERIC COMPLEX) 500 MG CAPS Take 2,500 mg by mouth in the morning and at bedtime.    [provider]     Family History  Problem Relation Age of Onset   Cancer Mother    Uterine cancer Mother    Heart disease Father    Stroke Father    Cancer Sister        Lung cancer   Heart attack Brother 60       heart attack and CHF/ also some form of EP ablation   Parkinson's disease Brother    Atrial fibrillation Brother    Arthritis/Rheumatoid Cousin    Diabetes Other    Colon cancer Neg Hx    Stomach cancer Neg Hx    Liver cancer Neg Hx    Rectal cancer Neg Hx    Esophageal cancer Neg Hx     Social History   Socioeconomic History   Marital status: Married    Spouse name: Ann   Number of children: 1   Years of education: Not on file   Highest education level: Not on file  Occupational History   Occupation: Retired    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 Nadeen Landau from [place] and moved back there after retirement. He  Has a daughter living in [place]. Quit smoking around 1988. Married   Right-handed   Caffeine: soda daily, decaf coffee   Social Drivers of Corporate investment banker Strain: Low Risk  (12/08/2022)    Received from [facility]   Overall Financial Resource Strain (CARDIA)    Difficulty of Paying Living Expenses: Not hard at all  Food Insecurity: No Food Insecurity (01/09/2023)   Hunger Vital Sign    Worried About Running Out of Food in the Last Year: Never true    Ran Out of Food in the Last Year: Never true  Transportation Needs: No Transportation Needs (01/09/2023)   PRAPARE - Administrator, Civil Service (Medical): No    Lack of Transportation (Non-Medical): No  Physical Activity: Unknown (12/08/2022)   Received from [facility]   Exercise Vital Sign    Days of Exercise per Week: 0 days    Minutes of Exercise per Session: Not on file  Stress: Stress Concern Present (12/08/2022)   Received from [facility] - Occupational Stress Questionnaire    Feeling of Stress : To some extent  Social Connections: Socially Integrated (12/08/2022)   Received from [facility]   Social Network    How would you rate your social network (family, work, friends)?: Good participation with social networks    Review of Systems: A 12 point ROS discussed and pertinent positives are indicated in the HPI above.  All other systems are negative.  Review of Systems  Vital Signs: BP (!) 171/90   Pulse 92   Temp 98.5 F (36.9 C) (Oral)   Resp 16   Ht 5\' 9"  (1.753 m)   Wt 185 lb 13.6 oz (84.3 kg)   SpO2 96%   BMI 27.44 kg/m   Physical Exam Constitutional:      Appearance: He is not ill-appearing.  HENT:     Mouth/Throat:     Mouth: Mucous membranes are moist.     Pharynx: Oropharynx is clear.  Cardiovascular:     Rate and Rhythm: Normal rate and regular rhythm.     Heart sounds: Normal heart sounds.  Pulmonary:     Effort: Pulmonary effort is normal. No respiratory distress.     Breath sounds: Normal breath sounds.  Neurological:     General: No focal deficit present.     Mental Status: He is alert and oriented to person, place,  and time.  Psychiatric:        Mood and Affect: Mood normal.        Thought Content: Thought content normal.     Imaging: No results found.   Labs:  CBC: Recent Labs    01/02/23 0744 01/28/23 1356 02/11/23 1028 02/17/23 1526  WBC 13.9* 9.4 8.4 8.9  HGB 10.5* 10.5* 10.1* 10.7*  HCT 33.2* 32.4* 29.9* 33.1*  PLT 229 235 205 209    COAGS: Recent Labs    12/31/22 1745  INR 1.2  APTT 32    BMP: Recent Labs    01/02/23 0744 01/28/23 1356 02/11/23 1028 02/17/23 1526  NA 139 141 139 139  K 3.4* 4.2 3.6 3.7  CL 99 102 104 101  CO2 27 29 27 29   GLUCOSE 155* 106* 137* 167*  BUN 47* 28* 23 24*  CALCIUM 8.9 9.5 9.1 8.8*  CREATININE 4.04* 3.17* 2.67* 2.90*  GFRNONAA 14* 19* 24* 21*    LIVER FUNCTION TESTS: Recent Labs    12/31/22 1745 01/28/23 1356 02/11/23 1028 02/17/23 1526  BILITOT 0.7 0.4 0.4 0.3  AST 15 10* 10* 11*  ALT 12 7 5 6   ALKPHOS 94 111 97 109  PROT 8.2* 8.3* 7.5 7.7  ALBUMIN 3.2* 3.8 3.5 3.6     Assessment and Plan: Lung cancer Plan for port placement Risks and benefits of image guided port-a-catheter placement was discussed with the patient including, but not limited to bleeding, infection, pneumothorax, or fibrin sheath development and need for additional procedures.  All of the patient's questions were answered, patient is agreeable to proceed. Consent signed and in chart.     Electronically Signed: Brayton El, PA-C 02/19/2023, 8:42 AM   I spent a total of 30 minutes in face to face in clinical consultation, greater than 50% of which was counseling/coordinating care  for LN bx

## 2023-02-19 NOTE — Sedation Documentation (Signed)
Heart rate elevated 135 bpm MD aware.

## 2023-02-19 NOTE — Procedures (Signed)
Pre Procedure Dx: Poor venous access Post Procedural Dx: Same  Successful placement of left IJ approach port-a-cath with tip at the superior caval atrial junction. The catheter is ready for immediate use.  Estimated Blood Loss: Trace  Complications: None immediate.  Jay Rudie Sermons, MD Pager #: [phone]   

## 2023-02-19 NOTE — Discharge Instructions (Signed)

## 2023-02-20 ENCOUNTER — Telehealth: Payer: Self-pay | Admitting: Medical Oncology

## 2023-02-20 ENCOUNTER — Encounter: Payer: Self-pay | Admitting: Internal Medicine

## 2023-02-20 ENCOUNTER — Emergency Department (HOSPITAL_COMMUNITY): Payer: HMO

## 2023-02-20 ENCOUNTER — Emergency Department (HOSPITAL_COMMUNITY)
Admission: EM | Admit: 2023-02-20 | Discharge: 2023-02-20 | Disposition: A | Discharge status: Home / Self Care | Payer: HMO | Attending: Emergency Medicine | Admitting: Emergency Medicine

## 2023-02-20 ENCOUNTER — Encounter (HOSPITAL_COMMUNITY): Payer: Self-pay

## 2023-02-20 ENCOUNTER — Inpatient Hospital Stay: Payer: HMO

## 2023-02-20 ENCOUNTER — Other Ambulatory Visit: Payer: Self-pay

## 2023-02-20 VITALS — BP 140/90 | HR 75 | Temp 98.4°F | Resp 16

## 2023-02-20 DIAGNOSIS — Z20822 Contact with and (suspected) exposure to covid-19: Secondary | ICD-10-CM | POA: Insufficient documentation

## 2023-02-20 DIAGNOSIS — R509 Fever, unspecified: Secondary | ICD-10-CM | POA: Diagnosis present

## 2023-02-20 DIAGNOSIS — Z5112 Encounter for antineoplastic immunotherapy: Secondary | ICD-10-CM | POA: Diagnosis not present

## 2023-02-20 DIAGNOSIS — Z85118 Personal history of other malignant neoplasm of bronchus and lung: Secondary | ICD-10-CM | POA: Diagnosis not present

## 2023-02-20 DIAGNOSIS — D72829 Elevated white blood cell count, unspecified: Secondary | ICD-10-CM | POA: Insufficient documentation

## 2023-02-20 DIAGNOSIS — Z7982 Long term (current) use of aspirin: Secondary | ICD-10-CM | POA: Insufficient documentation

## 2023-02-20 DIAGNOSIS — C3411 Malignant neoplasm of upper lobe, right bronchus or lung: Secondary | ICD-10-CM

## 2023-02-20 DIAGNOSIS — Z794 Long term (current) use of insulin: Secondary | ICD-10-CM | POA: Insufficient documentation

## 2023-02-20 LAB — CBC WITH DIFFERENTIAL/PLATELET
Abs Immature Granulocytes: 0.22 10*3/uL — ABNORMAL HIGH (ref 0.00–0.07)
Basophils Absolute: 0 10*3/uL (ref 0.0–0.1)
Basophils Relative: 0 %
Eosinophils Absolute: 0.2 10*3/uL (ref 0.0–0.5)
Eosinophils Relative: 1 %
HCT: 32.3 % — ABNORMAL LOW (ref 39.0–52.0)
Hemoglobin: 10.4 g/dL — ABNORMAL LOW (ref 13.0–17.0)
Immature Granulocytes: 1 %
Lymphocytes Relative: 2 %
Lymphs Abs: 0.5 10*3/uL — ABNORMAL LOW (ref 0.7–4.0)
MCH: 30 pg (ref 26.0–34.0)
MCHC: 32.2 g/dL (ref 30.0–36.0)
MCV: 93.1 fL (ref 80.0–100.0)
Monocytes Absolute: 0.3 10*3/uL (ref 0.1–1.0)
Monocytes Relative: 1 %
Neutro Abs: 19.1 10*3/uL — ABNORMAL HIGH (ref 1.7–7.7)
Neutrophils Relative %: 95 %
Platelets: 162 10*3/uL (ref 150–400)
RBC: 3.47 MIL/uL — ABNORMAL LOW (ref 4.22–5.81)
RDW: 14.9 % (ref 11.5–15.5)
WBC: 20.3 10*3/uL — ABNORMAL HIGH (ref 4.0–10.5)
nRBC: 0 % (ref 0.0–0.2)

## 2023-02-20 LAB — COMPREHENSIVE METABOLIC PANEL
ALT: 12 U/L (ref 0–44)
AST: 18 U/L (ref 15–41)
Albumin: 3.1 g/dL — ABNORMAL LOW (ref 3.5–5.0)
Alkaline Phosphatase: 82 U/L (ref 38–126)
Anion gap: 13 (ref 5–15)
BUN: 40 mg/dL — ABNORMAL HIGH (ref 8–23)
CO2: 23 mmol/L (ref 22–32)
Calcium: 8.4 mg/dL — ABNORMAL LOW (ref 8.9–10.3)
Chloride: 101 mmol/L (ref 98–111)
Creatinine, Ser: 2.76 mg/dL — ABNORMAL HIGH (ref 0.61–1.24)
GFR, Estimated: 23 mL/min — ABNORMAL LOW (ref >60)
Glucose, Bld: 161 mg/dL — ABNORMAL HIGH (ref 70–99)
Potassium: 3.6 mmol/L (ref 3.5–5.1)
Sodium: 137 mmol/L (ref 135–145)
Total Bilirubin: 0.7 mg/dL (ref 0.0–1.2)
Total Protein: 7.6 g/dL (ref 6.5–8.1)

## 2023-02-20 LAB — GROUP A STREP BY PCR: Group A Strep by PCR: NOT DETECTED

## 2023-02-20 LAB — RESP PANEL BY RT-PCR (RSV, FLU A&B, COVID)  RVPGX2
Influenza A by PCR: NEGATIVE
Influenza B by PCR: NEGATIVE
Resp Syncytial Virus by PCR: NEGATIVE
SARS Coronavirus 2 by RT PCR: NEGATIVE

## 2023-02-20 MED ORDER — CEFEPIME HCL 2 G IV SOLR
2.0000 g | Freq: Once | INTRAVENOUS | Status: DC
  Filled 2023-02-20: qty 12.5

## 2023-02-20 MED ORDER — PEGFILGRASTIM-FPGK 6 MG/0.6ML ~~LOC~~ SOSY
6.0000 mg | PREFILLED_SYRINGE | Freq: Once | SUBCUTANEOUS | Status: AC
  Administered 2023-02-20: 6 mg via SUBCUTANEOUS
  Filled 2023-02-20: qty 0.6

## 2023-02-20 MED ORDER — SODIUM CHLORIDE 0.9 % IV BOLUS: 1000.0000 mL | Freq: Once | INTRAVENOUS | Status: DC

## 2023-02-20 MED ORDER — VANCOMYCIN HCL IN DEXTROSE 1-5 GM/200ML-% IV SOLN: 1000.0000 mg | Freq: Once | INTRAVENOUS | Status: DC

## 2023-02-20 NOTE — Telephone Encounter (Signed)
Mychart email -fever  I called Aggie Cosier because of her mychart  message that pts temp was low grade of 110.f today . She apologized and said it was not 169f it was 100.1 .   She took it while I was on the  phone and the thermometer reads "102 f.". He  reported  chills earlier in the day    Fever 102 @ 1725  01/21-chemo and immunotherapy received Yesterday -port a cath placement Today -growth factor injection  I instructed Rosey Bath to take pt to [facility] ED. She said she will take  him .

## 2023-02-20 NOTE — Progress Notes (Signed)
ED Pharmacy Antibiotic Sign Off An antibiotic consult was received from an ED provider for cefepime per pharmacy dosing for bacteremia (no confirmed cultures). A chart review was completed to assess appropriateness.   The following one time order(s) were placed:  Cefepime 2 g   Further antibiotic and/or antibiotic pharmacy consults should be ordered by the admitting provider if indicated.   Thank you for allowing pharmacy to be a part of this patient's care.   Pricilla Riffle, PharmD, BCPS Clinical Pharmacist 02/20/2023 8:38 PM

## 2023-02-20 NOTE — ED Provider Notes (Signed)
[place] EMERGENCY DEPARTMENT AT [facility] Provider Note   CSN: [id number] Arrival date & time: 02/20/23  1821     History  Chief Complaint  Patient presents with   Fever    Mehar H Swopes is a 78 y.o. male history of lung cancer here presenting with fever.  Patient just started chemotherapy this week.  Patient also received Neulasta today and had a port placed yesterday.  Patient states that this afternoon he had a low-grade temp 100.9.  He denies any cough or abdominal pain or urinary symptoms.  Patient called oncology office and was sent here to make sure his port is not infected and make sure he is not septic.  He states that while he was in the ER, his temperature is down to 99.  The history is provided by the patient.       Home Medications Prior to Admission medications   Medication Sig Start Date End Date Taking? Authorizing Provider  acetaminophen (TYLENOL) 500 MG tablet Take 1,000 mg by mouth every 6 (six) hours as needed (for pain.).    [provider]  aspirin EC 81 MG tablet Take 81 mg by mouth daily. Swallow whole.    [provider]  atorvastatin (LIPITOR) 40 MG tablet Take 40 mg by mouth at bedtime.    [provider]  buPROPion (WELLBUTRIN XL) 150 MG 24 hr tablet Take 150 mg by mouth every morning.    [provider]  busPIRone (BUSPAR) 10 MG tablet Take 10 mg by mouth in the morning. 12/11/22   [provider]  Dulaglutide (TRULICITY) 1.5 MG/0.5ML SOAJ Inject 1.5 mg into the skin daily. Takes on Thursdays 10/01/22   [provider]  gabapentin (NEURONTIN) 100 MG capsule Take 1 capsule (100 mg total) by mouth daily. 01/02/23   Laretta Bolster, MD  HYDROcodone-acetaminophen (NORCO/VICODIN) 5-325 MG tablet Take 1 tablet by mouth daily as needed for moderate pain (pain score 4-6) or severe pain (pain score 7-10). 01/24/23 04/24/23  [provider]  insulin glargine (LANTUS) 100 UNIT/ML injection  Inject 40 Units into the skin in the morning.    [provider]  lidocaine-prilocaine (EMLA) cream Apply to affected area once 02/11/23   Si Gaul, MD  ondansetron (ZOFRAN) 4 MG tablet Take 4 mg by mouth every 8 (eight) hours as needed for nausea or vomiting.    [provider]  prochlorperazine (COMPAZINE) 10 MG tablet Take 1 tablet (10 mg total) by mouth every 6 (six) hours as needed for nausea or vomiting. 02/11/23   Si Gaul, MD  sertraline (ZOLOFT) 100 MG tablet Take 100 mg by mouth in the morning.    [provider]  torsemide (DEMADEX) 20 MG tablet Take 2 tablets (40 mg total) by mouth daily. Patient taking differently: Take 40 mg by mouth in the morning. 12/04/22 01/14/23  [name], MD      Allergies    Amlodipine, Penicillins, and Hydralazine    Review of Systems   Review of Systems  Constitutional:  Positive for fever.  All other systems reviewed and are negative.   Physical Exam Updated Vital Signs BP (!) 125/96 (BP Location: Right Arm)   Pulse 65   Temp 99.3 F (37.4 C) (Oral)   Resp 16   Ht 5\' 9"  (1.753 m)   Wt 84 kg   SpO2 95%   BMI 27.35 kg/m  Physical Exam Vitals and nursing note reviewed.  Constitutional:  Comments: Chronic ill appearing  HENT:     Head: Normocephalic.     Nose: Nose normal.     Mouth/Throat:     Mouth: Mucous membranes are moist.  Eyes:     Extraocular Movements: Extraocular movements intact.     Pupils: Pupils are equal, round, and reactive to light.  Cardiovascular:     Rate and Rhythm: Normal rate and regular rhythm.     Pulses: Normal pulses.     Heart sounds: Normal heart sounds.  Pulmonary:     Effort: Pulmonary effort is normal.     Comments: Diminished on the right side.  Port on the left side of the chest.  There is no purulent discharge and no erythema around the port site Abdominal:     General: Abdomen is flat.     Palpations: Abdomen is soft.  Musculoskeletal:         General: Normal range of motion.     Cervical back: Normal range of motion and neck supple.  Skin:    General: Skin is warm.     Capillary Refill: Capillary refill takes less than 2 seconds.  Neurological:     General: No focal deficit present.     Mental Status: He is alert.  Psychiatric:        Mood and Affect: Mood normal.     ED Results / Procedures / Treatments   Labs (all labs ordered are listed, but only abnormal results are displayed) Labs Reviewed  CBC WITH DIFFERENTIAL/PLATELET - Abnormal; Notable for the following components:      Result Value   WBC 20.3 (*)    RBC 3.47 (*)    Hemoglobin 10.4 (*)    HCT 32.3 (*)    Neutro Abs 19.1 (*)    Lymphs Abs 0.5 (*)    Abs Immature Granulocytes 0.22 (*)    All other components within normal limits  COMPREHENSIVE METABOLIC PANEL - Abnormal; Notable for the following components:   Glucose, Bld 161 (*)    BUN 40 (*)    Creatinine, Ser 2.76 (*)    Calcium 8.4 (*)    Albumin 3.1 (*)    GFR, Estimated 23 (*)    All other components within normal limits  RESP PANEL BY RT-PCR (RSV, FLU A&B, COVID)  RVPGX2  GROUP A STREP BY PCR    EKG None  Radiology DG Chest Portable 1 View Result Date: 02/20/2023 CLINICAL DATA:  Fever and sore throat right-sided lung cancer EXAM: PORTABLE CHEST 1 VIEW COMPARISON:  X-ray 12/31/2022. FINDINGS: New left IJ port with tip along the SVC right atrial junction. Film is rotated to the right. Enlarged cardiopericardial silhouette calcified aorta. Left lung is grossly clear without consolidation, pneumothorax or effusion. There is some hazy opacity along the right upper lung. Some nodularity. Please correlate for right upper lobe mass as seen on prior PET-CT scan. No right-sided effusion or new consolidation. Fixation hardware along the lower cervical spine. IMPRESSION: Masslike opacity in the right upper lung extending to the hilum consistent with known diagnosis. Chest port. Enlarged heart.  Electronically Signed   By: Karen Kays M.D.   On: 02/20/2023 19:11   IR IMAGING GUIDED PORT INSERTION Result Date: 02/19/2023 INDICATION: History of lung cancer. In need of durable intravenous access for chemotherapy administration. EXAM: IMPLANTED PORT A CATH PLACEMENT WITH ULTRASOUND AND FLUOROSCOPIC GUIDANCE COMPARISON:  PET-CT-12/16/2022 MEDICATIONS: None ANESTHESIA/SEDATION: Moderate (conscious) sedation was employed during this procedure as administered by the Interventional Radiology  RN. A total of Versed 2 mg and Fentanyl 100 mcg was administered intravenously. Moderate Sedation Time: 20 minutes. The patient's level of consciousness and vital signs were monitored continuously by radiology nursing throughout the procedure under my direct supervision. CONTRAST:  None FLUOROSCOPY TIME:  1 minute, 6 seconds (6 mGy) COMPLICATIONS: None immediate. PROCEDURE: The procedure, risks, benefits, and alternatives were explained to the patient. Questions regarding the procedure were encouraged and answered. The patient understands and consents to the procedure. Given the presence of known hypermetabolic right cervical lymphadenopathy, the decision was made to proceed with left internal jugular approach port a catheter placement. The left neck and chest were prepped with chlorhexidine in a sterile fashion, and a sterile drape was applied covering the operative field. Maximum barrier sterile technique with sterile gowns and gloves were used for the procedure. A timeout was performed prior to the initiation of the procedure. Local anesthesia was provided with 1% lidocaine with epinephrine. After creating a small venotomy incision, a micropuncture kit was utilized to access the internal jugular vein. Real-time ultrasound guidance was utilized for vascular access including the acquisition of a permanent ultrasound image documenting patency of the accessed vessel. The microwire was utilized to measure appropriate catheter  length. A subcutaneous port pocket was then created along the upper chest wall utilizing a combination of sharp and blunt dissection. The pocket was irrigated with sterile saline. A single lumen ISP sized power injectable port was chosen for placement. The 8 Fr catheter was tunneled from the port pocket site to the venotomy incision. The port was placed in the pocket. The external catheter was trimmed to appropriate length. At the venotomy, an 8 Fr peel-away sheath was placed over a guidewire under fluoroscopic guidance. The catheter was then placed through the sheath and the sheath was removed. Final catheter positioning was confirmed and documented with a fluoroscopic spot radiograph. The port was accessed with a Huber needle, aspirated and flushed with heparinized saline. The venotomy site was closed with an interrupted 4-0 Vicryl suture. The port pocket incision was closed with interrupted 2-0 Vicryl suture. Dermabond and Steri-strips were applied to both incisions. Dressings were applied. The patient tolerated the procedure well without immediate post procedural complication. FINDINGS: After catheter placement, the tip lies within the superior cavoatrial junction. The catheter aspirates and flushes normally and is ready for immediate use. IMPRESSION: Successful placement of a left internal jugular approach power injectable Port-A-Cath. The catheter is ready for immediate use. Electronically Signed   By: Simonne Come M.D.   On: 02/19/2023 12:48    Procedures Procedures    Medications Ordered in ED Medications - No data to display  ED Course/ Medical Decision Making/ A&P                                 Medical Decision Making RONDO KOEL is a 78 y.o. male here presenting with fever.  Patient just had Neulasta today.  Patient has had a port placed yesterday.  There is no signs of port infection.  Plan to do sepsis workup with CBC CMP and lactate and cultures and chest x-ray.  9:04 PM Patient's  white blood cell count is 20,000.  Patient has mostly immature granulocytes.  I discussed case with Dr. Georgiann Mohs from oncology.  He states that this is expected after Neulasta.  Patient and family does not want to stay for IV antibiotics and they really do not  want to be stuck again to get blood cultures.  Dr. Georgiann Mohs said that this is expected reaction after Neulasta.  He recommends repeat CBC tomorrow.  Family states that they will call the office tomorrow   Problems Addressed: Fever, unspecified fever cause: acute illness or injury Leukocytosis, unspecified type: acute illness or injury  Amount and/or Complexity of Data Reviewed Labs: ordered. Decision-making details documented in ED Course.    Final Clinical Impression(s) / ED Diagnoses Final diagnoses:  None    Rx / DC Orders ED Discharge Orders     None         Charlynne Pander, MD 02/20/23 2106

## 2023-02-20 NOTE — ED Triage Notes (Signed)
C/o fever with t.max 100.9 at home, chills, headache, sore throat, and nausea Pt reports chemo and immunotherapy on 1/21 and port placement yesterday.

## 2023-02-20 NOTE — Discharge Instructions (Addendum)
Your white blood cell count is 20,000.  I recommend that you call Dr. Asa Lente office tomorrow to get a repeat blood count.  I think this is from your Neulasta  See your doctor for follow-up  Return to ER if you have fever or abdominal pain or vomiting

## 2023-02-20 NOTE — ED Notes (Signed)
Pt and family reports wanting to wait to speak with provider before having additional blood work and antibiotics administered. MD notified.

## 2023-02-20 NOTE — ED Provider Triage Note (Signed)
Emergency Medicine Provider Triage Evaluation Note  Jeffery Fernandez , a 78 y.o. male  was evaluated in triage.  Pt complains of fever (tmax 100.51F), chills, sore throat, ha  Hx of SCLC with mets (finished radiation on back,. Started chemo on Tuesday. Port was placed yesterday)  Tylenol at 1800  Review of Systems  Positive: fever (tmax 100.51F), chills, sore throat, ha Negative: No acute coughing, congestion, urinary symptoms, sob, cp  Physical Exam  BP (!) 125/96 (BP Location: Right Arm)   Pulse 65   Temp 99.1 F (37.3 C) (Oral)   Resp 16   Ht 5\' 9"  (1.753 m)   Wt 84 kg   SpO2 95%   BMI 27.35 kg/m  Gen:   Awake, no distress   Resp:  Normal effort  MSK:   Moves extremities without difficulty  Other:  Port (L) appears noninfectious  Medical Decision Making  Medically screening exam initiated at 6:46 PM.  Appropriate orders placed.  Jeffery Fernandez was informed that the remainder of the evaluation will be completed by another provider, this initial triage assessment does not replace that evaluation, and the importance of remaining in the ED until their evaluation is complete.  Workup started   Judithann Sheen, [place] 02/20/23 [phone]

## 2023-02-21 ENCOUNTER — Encounter: Payer: Self-pay | Admitting: Internal Medicine

## 2023-02-21 NOTE — Telephone Encounter (Signed)
Per Wilford Sports , I told Rosey Bath that her dad did not have to come today for lab and to keep appt Tuesday . I gave her ED precautions.

## 2023-02-24 ENCOUNTER — Telehealth: Payer: Self-pay

## 2023-02-24 NOTE — Telephone Encounter (Signed)
Pt daughter called reporting a decrease in pt weight and appetite, requesting to discuss further at next appt.this RN reviewed expected side effects from treatment and pt daughter had no further needs at this time. Medical-oncology team made aware.

## 2023-02-25 ENCOUNTER — Inpatient Hospital Stay: Payer: HMO

## 2023-02-25 ENCOUNTER — Inpatient Hospital Stay (HOSPITAL_BASED_OUTPATIENT_CLINIC_OR_DEPARTMENT_OTHER): Payer: HMO | Admitting: Internal Medicine

## 2023-02-25 VITALS — BP 102/63 | HR 111 | Temp 97.7°F | Resp 15 | Ht 69.0 in | Wt 169.4 lb

## 2023-02-25 DIAGNOSIS — E860 Dehydration: Secondary | ICD-10-CM

## 2023-02-25 DIAGNOSIS — C3411 Malignant neoplasm of upper lobe, right bronchus or lung: Secondary | ICD-10-CM

## 2023-02-25 DIAGNOSIS — Z95828 Presence of other vascular implants and grafts: Secondary | ICD-10-CM

## 2023-02-25 DIAGNOSIS — Z5112 Encounter for antineoplastic immunotherapy: Secondary | ICD-10-CM | POA: Diagnosis not present

## 2023-02-25 LAB — CMP (CANCER CENTER ONLY)
ALT: 8 U/L (ref 0–44)
AST: 9 U/L — ABNORMAL LOW (ref 15–41)
Albumin: 3.4 g/dL — ABNORMAL LOW (ref 3.5–5.0)
Alkaline Phosphatase: 94 U/L (ref 38–126)
Anion gap: 11 (ref 5–15)
BUN: 54 mg/dL — ABNORMAL HIGH (ref 8–23)
CO2: 27 mmol/L (ref 22–32)
Calcium: 9 mg/dL (ref 8.9–10.3)
Chloride: 100 mmol/L (ref 98–111)
Creatinine: 3.12 mg/dL — ABNORMAL HIGH (ref 0.61–1.24)
GFR, Estimated: 20 mL/min — ABNORMAL LOW (ref >60)
Glucose, Bld: 122 mg/dL — ABNORMAL HIGH (ref 70–99)
Potassium: 3.3 mmol/L — ABNORMAL LOW (ref 3.5–5.1)
Sodium: 138 mmol/L (ref 135–145)
Total Bilirubin: 0.6 mg/dL (ref 0.0–1.2)
Total Protein: 7.1 g/dL (ref 6.5–8.1)

## 2023-02-25 LAB — CBC WITH DIFFERENTIAL (CANCER CENTER ONLY)
Abs Immature Granulocytes: 0.01 10*3/uL (ref 0.00–0.07)
Basophils Absolute: 0 10*3/uL (ref 0.0–0.1)
Basophils Relative: 1 %
Eosinophils Absolute: 0.4 10*3/uL (ref 0.0–0.5)
Eosinophils Relative: 22 %
HCT: 29.9 % — ABNORMAL LOW (ref 39.0–52.0)
Hemoglobin: 9.9 g/dL — ABNORMAL LOW (ref 13.0–17.0)
Immature Granulocytes: 1 %
Lymphocytes Relative: 25 %
Lymphs Abs: 0.5 10*3/uL — ABNORMAL LOW (ref 0.7–4.0)
MCH: 29.6 pg (ref 26.0–34.0)
MCHC: 33.1 g/dL (ref 30.0–36.0)
MCV: 89.3 fL (ref 80.0–100.0)
Monocytes Absolute: 0.5 10*3/uL (ref 0.1–1.0)
Monocytes Relative: 25 %
Neutro Abs: 0.5 10*3/uL — ABNORMAL LOW (ref 1.7–7.7)
Neutrophils Relative %: 26 %
Platelet Count: 109 10*3/uL — ABNORMAL LOW (ref 150–400)
RBC: 3.35 MIL/uL — ABNORMAL LOW (ref 4.22–5.81)
RDW: 14.4 % (ref 11.5–15.5)
Smear Review: NORMAL
WBC Count: 2 10*3/uL — ABNORMAL LOW (ref 4.0–10.5)
nRBC: 0 % (ref 0.0–0.2)

## 2023-02-25 MED ORDER — SODIUM CHLORIDE 0.9 % IV SOLN
INTRAVENOUS | Status: AC
Start: 2023-02-25 — End: 2023-02-25

## 2023-02-25 MED ORDER — SODIUM CHLORIDE 0.9% FLUSH
10.0000 mL | Freq: Once | INTRAVENOUS | Status: AC
  Administered 2023-02-25: 10 mL

## 2023-02-25 MED ORDER — ONDANSETRON HCL 4 MG/2ML IJ SOLN
8.0000 mg | Freq: Once | INTRAMUSCULAR | Status: AC
  Administered 2023-02-25: 8 mg via INTRAVENOUS
  Filled 2023-02-25: qty 4

## 2023-02-25 MED ORDER — HEPARIN SOD (PORK) LOCK FLUSH 100 UNIT/ML IV SOLN
500.0000 [IU] | Freq: Once | INTRAVENOUS | Status: AC
  Administered 2023-02-25: 500 [IU]

## 2023-02-25 NOTE — Progress Notes (Signed)
Ok to run IVF over 1.5 hours today per Dr. Arbutus Ped.

## 2023-02-25 NOTE — Progress Notes (Signed)
[facility] [phone]   [phone]  OFFICE PROGRESS NOTE  [name], MD [address] [place] [address]  DIAGNOSIS: Stage IV (T2b, N3, M1b) non-small cell lung cancer, adenocarcinoma . This was diagnosed in December 2024 and presented with large right apical lung mass in addition to right hilar, mediastinal and supraclavicular lymphadenopathy as well as pulmonary nodules in the right upper lobe and right lower lobe with T10 bone metastasis in addition to medial left inguinal lymph node versus subcutaneous nodule.   Biomarker Findings HRD signature - HRDsig Negative Microsatellite status - Cannot Be Determined ? Tumor Mutational Burden - 8 Muts/Mb Genomic Findings For a complete list of the genes assayed, please refer to the Appendix. CDKN2A loss KEAP1 K11fs*60 STK11 E291* CDKN2B loss IKZF1 R111* TP53 R280K 8 Disease relevant genes with no reportable alterations: ALK, BRAF, EGFR, ERBB2, KRAS, MET, RET, ROS1  PRIOR THERAPY: Palliative radiotherapy to the T10 bone metastasis.  CURRENT THERAPY: First-line treatment with chemoimmunotherapy in the form of carboplatin for AUC of 5 and paclitaxel 175 Mg/M2 on days 1 and 22 in addition to ipilimumab 1 Mg/KG on day 1 and nivolumab 360 Mg IV on days 1 and 22 for 1 cycle followed by maintenance treatment with ipilimumab 1 Mg/KG every 6 weeks and nivolumab 360 Mg IV every 3 weeks.  First dose February 18, 2023.  INTERVAL HISTORY: Jeffery Fernandez 78 y.o. male returns to the clinic today for follow-up visit accompanied by his wife and daughter.Discussed the use of AI scribe software for clinical note transcription with the patient, who gave verbal consent to proceed.  History of Present Illness   The patient is a 78 year old male with stage four non-small cell lung cancer who presents with nausea, vomiting, and weakness following chemotherapy. He is accompanied by a  caregiver.  He began the first cycle of chemotherapy last week, receiving immunotherapy with ipilimumab and nivolumab, and chemotherapy with carboplatin and Paclitaxel. Since then, he has experienced significant nausea and vomiting, which have persisted despite the use of antiemetics such as Zofran and Compazine. He is unable to eat, stating 'every time I look at a little bit of food, I want to throw it up.'  He reports severe weakness and leg pain, which began after receiving a Neulasta (Udenyca) injection. This led to an emergency room visit on the night of the injection. Prior to the injection, he was feeling fine, even after having a port placed the day after chemotherapy.  He experiences occasional chills and has noted a sensation of reflux, describing it as 'everything hurt when he was swallowing' and 'sometimes I get that old flame in my throat.' He has not been using omeprazole regularly. He has two small sores on the roof of his mouth under his dental plate, for which he has been using salt water rinses.  His fluid intake is reportedly good, but he struggles with eating, managing only small bites. His white blood cell count was noted to be low today. No significant fever.       MEDICAL HISTORY: Past Medical History:  Diagnosis Date   Arrhythmia    Arthritis    Cataract    bilateral cataract extraction with intraoccular lens implants   Chest pain, unspecified    Chronic airway obstruction, not elsewhere classified    PT. DENIES HE HAS COPD   CKD (chronic kidney disease)    Colon polyps    Adenomatous  Polyps 2007   Depression    Diabetes mellitus    Diverticulosis    Gout    Heart murmur    History of kidney stones    Hyperlipemia    Hypertension    Nephrolithiasis    Normocytic anemia 04/23/2017   Obstructive sleep apnea (adult) (pediatric)    Other and unspecified hyperlipidemia    Other left bundle branch block    Retinal vascular occlusion, unspecified    right    Shortness of breath    on exertion   Unspecified sleep apnea    Vision loss of right eye    White coat syndrome with diagnosis of hypertension 09/19/2017    ALLERGIES:  is allergic to amlodipine, penicillins, and hydralazine.  MEDICATIONS:  Current Outpatient Medications  Medication Sig Dispense Refill   acetaminophen (TYLENOL) 500 MG tablet Take 1,000 mg by mouth every 6 (six) hours as needed (for pain.).     aspirin EC 81 MG tablet Take 81 mg by mouth daily. Swallow whole.     atorvastatin (LIPITOR) 40 MG tablet Take 40 mg by mouth at bedtime.     buPROPion (WELLBUTRIN XL) 150 MG 24 hr tablet Take 150 mg by mouth every morning.     busPIRone (BUSPAR) 10 MG tablet Take 10 mg by mouth in the morning.     Dulaglutide (TRULICITY) 1.5 MG/0.5ML SOAJ Inject 1.5 mg into the skin daily. Takes on Thursdays     gabapentin (NEURONTIN) 100 MG capsule Take 1 capsule (100 mg total) by mouth daily.     HYDROcodone-acetaminophen (NORCO/VICODIN) 5-325 MG tablet Take 1 tablet by mouth daily as needed for moderate pain (pain score 4-6) or severe pain (pain score 7-10).     insulin glargine (LANTUS) 100 UNIT/ML injection Inject 40 Units into the skin in the morning.     lidocaine-prilocaine (EMLA) cream Apply to affected area once 30 g 3   ondansetron (ZOFRAN) 4 MG tablet Take 4 mg by mouth every 8 (eight) hours as needed for nausea or vomiting.     prochlorperazine (COMPAZINE) 10 MG tablet Take 1 tablet (10 mg total) by mouth every 6 (six) hours as needed for nausea or vomiting. 30 tablet 1   sertraline (ZOLOFT) 100 MG tablet Take 100 mg by mouth in the morning.     torsemide (DEMADEX) 20 MG tablet Take 2 tablets (40 mg total) by mouth daily. (Patient taking differently: Take 40 mg by mouth in the morning.) 60 tablet 0   No current facility-administered medications for this visit.    SURGICAL HISTORY:  Past Surgical History:  Procedure Laterality Date   ANTERIOR CERVICAL DECOMP/DISCECTOMY FUSION N/A  06/26/2016   Procedure: Re-Exploration of Cervical Wound;  Surgeon: Donalee Citrin, MD;  Location: [facility] OR;  Service: Neurosurgery;  Laterality: N/A;   ANTERIOR CERVICAL DECOMP/DISCECTOMY FUSION N/A 06/26/2016   Procedure: ANTERIOR CERVICAL DECOMPRESSION/DISCECTOMY FUSION, INTERBODY PROSTESIS, PLATE, CERVICAL THREE CERVICAL FOUR, CERVICAL FOUR CERVICAL FIVE CERVICAL SIX;  Surgeon: Tressie Stalker, MD;  Location: [facility] OR;  Service: Neurosurgery;  Laterality: N/A;   APPLICATION OF WOUND VAC  06/05/2020   Procedure: APPLICATION OF WOUND VAC;  Surgeon: Tressie Stalker, MD;  Location: [facility] OR;  Service: Neurosurgery;;   BACK SURGERY     CATARACT EXTRACTION Bilateral    IR IMAGING GUIDED PORT INSERTION  02/19/2023   KIDNEY STONE SURGERY     KYPHOPLASTY N/A 01/19/2018   Procedure: KYPHOPLASTY LUMBAR THREE;  Surgeon: Tressie Stalker, MD;  Location: [facility] OR;  Service:  Neurosurgery;  Laterality: N/A;  KYPHOPLASTY LUMBAR THREE   LUMBAR WOUND DEBRIDEMENT N/A 06/05/2020   Procedure: LUMBAR WOUND DEBRIDEMENT;  Surgeon: Tressie Stalker, MD;  Location: [facility] OR;  Service: Neurosurgery;  Laterality: N/A;    REVIEW OF SYSTEMS:  Constitutional: positive for anorexia, fatigue, and weight loss Eyes: negative Ears, nose, mouth, throat, and face: negative Respiratory: positive for cough Cardiovascular: negative Gastrointestinal: positive for nausea Genitourinary:negative Integument/breast: negative Hematologic/lymphatic: negative Musculoskeletal:positive for back pain and muscle weakness Neurological: negative Behavioral/Psych: negative Endocrine: negative Allergic/Immunologic: negative   PHYSICAL EXAMINATION: General appearance: alert, cooperative, fatigued, and no distress Head: Normocephalic, without obvious abnormality, atraumatic Neck: no adenopathy, no JVD, supple, symmetrical, trachea midline, and thyroid not enlarged, symmetric, no tenderness/mass/nodules Lymph nodes: Cervical, supraclavicular, and axillary nodes  normal. Resp: clear to auscultation bilaterally Back: symmetric, no curvature. ROM normal. No CVA tenderness. Cardio: regular rate and rhythm, S1, S2 normal, no murmur, click, rub or gallop GI: soft, non-tender; bowel sounds normal; no masses,  no organomegaly Extremities: extremities normal, atraumatic, no cyanosis or edema Neurologic: Alert and oriented X 3, normal strength and tone. Normal symmetric reflexes. Normal coordination and gait  ECOG PERFORMANCE STATUS: 1 - Symptomatic but completely ambulatory  Blood pressure 102/63, pulse (!) 111, temperature 97.7 F (36.5 C), temperature source Temporal, resp. rate 15, height 5\' 9"  (1.753 m), weight 169 lb 6.4 oz (76.8 kg), SpO2 97%.  LABORATORY DATA: Lab Results  Component Value Date   WBC 2.0 (L) 02/25/2023   HGB 9.9 (L) 02/25/2023   HCT 29.9 (L) 02/25/2023   MCV 89.3 02/25/2023   PLT 109 (L) 02/25/2023      Chemistry      Component Value Date/Time   NA 137 02/20/2023 1858   NA 142 07/03/2022 1356   K 3.6 02/20/2023 1858   CL 101 02/20/2023 1858   CO2 23 02/20/2023 1858   BUN 40 (H) 02/20/2023 1858   BUN 22 07/03/2022 1356   CREATININE 2.76 (H) 02/20/2023 1858   CREATININE 2.90 (H) 02/17/2023 1526      Component Value Date/Time   CALCIUM 8.4 (L) 02/20/2023 1858   ALKPHOS 82 02/20/2023 1858   AST 18 02/20/2023 1858   AST 11 (L) 02/17/2023 1526   ALT 12 02/20/2023 1858   ALT 6 02/17/2023 1526   BILITOT 0.7 02/20/2023 1858   BILITOT 0.3 02/17/2023 1526       RADIOGRAPHIC STUDIES: DG Chest Portable 1 View Result Date: 02/20/2023 CLINICAL DATA:  Fever and sore throat right-sided lung cancer EXAM: PORTABLE CHEST 1 VIEW COMPARISON:  X-ray 12/31/2022. FINDINGS: New left IJ port with tip along the SVC right atrial junction. Film is rotated to the right. Enlarged cardiopericardial silhouette calcified aorta. Left lung is grossly clear without consolidation, pneumothorax or effusion. There is some hazy opacity along the right  upper lung. Some nodularity. Please correlate for right upper lobe mass as seen on prior PET-CT scan. No right-sided effusion or new consolidation. Fixation hardware along the lower cervical spine. IMPRESSION: Masslike opacity in the right upper lung extending to the hilum consistent with known diagnosis. Chest port. Enlarged heart. Electronically Signed   By: Karen Kays M.D.   On: 02/20/2023 19:11   IR IMAGING GUIDED PORT INSERTION Result Date: 02/19/2023 INDICATION: History of lung cancer. In need of durable intravenous access for chemotherapy administration. EXAM: IMPLANTED PORT A CATH PLACEMENT WITH ULTRASOUND AND FLUOROSCOPIC GUIDANCE COMPARISON:  PET-CT-12/16/2022 MEDICATIONS: None ANESTHESIA/SEDATION: Moderate (conscious) sedation was employed during this procedure as administered by  the Interventional Radiology RN. A total of Versed 2 mg and Fentanyl 100 mcg was administered intravenously. Moderate Sedation Time: 20 minutes. The patient's level of consciousness and vital signs were monitored continuously by radiology nursing throughout the procedure under my direct supervision. CONTRAST:  None FLUOROSCOPY TIME:  1 minute, 6 seconds (6 mGy) COMPLICATIONS: None immediate. PROCEDURE: The procedure, risks, benefits, and alternatives were explained to the patient. Questions regarding the procedure were encouraged and answered. The patient understands and consents to the procedure. Given the presence of known hypermetabolic right cervical lymphadenopathy, the decision was made to proceed with left internal jugular approach port a catheter placement. The left neck and chest were prepped with chlorhexidine in a sterile fashion, and a sterile drape was applied covering the operative field. Maximum barrier sterile technique with sterile gowns and gloves were used for the procedure. A timeout was performed prior to the initiation of the procedure. Local anesthesia was provided with 1% lidocaine with epinephrine.  After creating a small venotomy incision, a micropuncture kit was utilized to access the internal jugular vein. Real-time ultrasound guidance was utilized for vascular access including the acquisition of a permanent ultrasound image documenting patency of the accessed vessel. The microwire was utilized to measure appropriate catheter length. A subcutaneous port pocket was then created along the upper chest wall utilizing a combination of sharp and blunt dissection. The pocket was irrigated with sterile saline. A single lumen ISP sized power injectable port was chosen for placement. The 8 Fr catheter was tunneled from the port pocket site to the venotomy incision. The port was placed in the pocket. The external catheter was trimmed to appropriate length. At the venotomy, an 8 Fr peel-away sheath was placed over a guidewire under fluoroscopic guidance. The catheter was then placed through the sheath and the sheath was removed. Final catheter positioning was confirmed and documented with a fluoroscopic spot radiograph. The port was accessed with a Huber needle, aspirated and flushed with heparinized saline. The venotomy site was closed with an interrupted 4-0 Vicryl suture. The port pocket incision was closed with interrupted 2-0 Vicryl suture. Dermabond and Steri-strips were applied to both incisions. Dressings were applied. The patient tolerated the procedure well without immediate post procedural complication. FINDINGS: After catheter placement, the tip lies within the superior cavoatrial junction. The catheter aspirates and flushes normally and is ready for immediate use. IMPRESSION: Successful placement of a left internal jugular approach power injectable Port-A-Cath. The catheter is ready for immediate use. Electronically Signed   By: Simonne Come M.D.   On: 02/19/2023 12:48    ASSESSMENT AND PLAN: This is a very pleasant 78 years old white male with  Stage IV (T2b, N3, M1b) non-small cell lung cancer,  adenocarcinoma . This was diagnosed in December 2024 and presented with large right apical lung mass in addition to right hilar, mediastinal and supraclavicular lymphadenopathy as well as pulmonary nodules in the right upper lobe and right lower lobe with T10 bone metastasis in addition to medial left inguinal lymph node versus subcutaneous nodule.  Molecular studies showed no actionable mutation and there was insufficient material for PD-L1 expression. The patient status post palliative radiotherapy to the T10 vertebral body lesion. The patient is currently undergoing first-line treatment with chemoimmunotherapy in the form of carboplatin for AUC of 5 and paclitaxel 175 Mg/M2 on days 1 and 22 in addition to ipilimumab 1 Mg/KG on day 1 and nivolumab 360 Mg IV on days 1 and 22 for 1 cycle followed  by maintenance treatment with ipilimumab 1 Mg/KG every 6 weeks and nivolumab 360 Mg IV every 3 weeks.  First dose February 18, 2023.     Stage IV Non-Small Cell Lung Cancer 78 year old with stage IV non-small cell lung cancer, currently undergoing chemotherapy (carboplatin and paclitaxel) and immunotherapy (ipilimumab, nivolumab). Post-treatment, experienced significant nausea, vomiting, weakness, and leg pain, particularly after Neulasta (pegfilgrastim) injection. Symptoms include flu-like symptoms, myalgia, occasional chills, and difficulty swallowing, possibly due to reflux. Discussed that the first week post-treatment is typically the worst, with symptoms expected to improve. Emphasized the importance of monitoring for fever due to infection risk from low white blood cell count. - Administer IV fluids and ondansetron today - Consider omeprazole 20 mg daily for reflux symptoms - Monitor white blood cell count - Advise emergency department visit if fever exceeds 100.673F for more than an hour or reaches 101F - Schedule follow-up appointment next week - Perform a scan every nine weeks after treatment  cycle  Chemotherapy-Induced Nausea and Vomiting Continues to experience significant nausea and vomiting despite taking ondansetron and prochlorperazine. Discussed the use of IV ondansetron today. - Administer IV ondansetron today - Continue ondansetron and prochlorperazine as prescribed - Encourage fluid intake, including electrolyte solutions  Chemotherapy-Induced Myalgia Reports severe leg pain and generalized body aches, particularly after Neulasta injection. Symptoms are consistent with Neulasta side effects, including flu-like symptoms and myalgia. Discussed the use of acetaminophen and loratadine for symptom relief. - Recommend acetaminophen and loratadine for symptom relief  Neutropenia White blood cell count is low, increasing infection risk. Discussed the importance of monitoring for signs of infection and avoiding masking fever with acetaminophen. - Monitor for signs of infection - Advise emergency department visit if fever exceeds 100.673F for more than an hour or reaches 101F - Avoid masking fever with acetaminophen  Oral Mucositis Has two small sores on the roof of the mouth, likely due to chemotherapy-induced mucositis. Discussed the use of salt water rinses for symptom management. - Continue salt water rinses  Gastroesophageal Reflux Disease (GERD) Reports reflux symptoms, including pain when swallowing and sensation of food getting stuck in the throat, possibly exacerbated by chemotherapy. Discussed starting omeprazole. - Start omeprazole 20 mg daily  Follow-up - Schedule follow-up appointment next week - Perform a scan every nine weeks after treatment cycle.   He was advised to call immediately if he has any other concerning symptoms in the interval.  The patient voices understanding of current disease status and treatment options and is in agreement with the current care plan.  All questions were answered. The patient knows to call the clinic with any problems,  questions or concerns. We can certainly see the patient much sooner if necessary.  The total time spent in the appointment was 30 minutes.  Disclaimer: This note was dictated with voice recognition software. Similar sounding words can inadvertently be transcribed and may not be corrected upon review.

## 2023-02-26 ENCOUNTER — Telehealth: Payer: Self-pay | Admitting: *Deleted

## 2023-02-26 ENCOUNTER — Other Ambulatory Visit: Payer: Self-pay

## 2023-02-26 DIAGNOSIS — C3411 Malignant neoplasm of upper lobe, right bronchus or lung: Secondary | ICD-10-CM

## 2023-02-26 MED ORDER — HYDROCODONE-ACETAMINOPHEN 5-325 MG PO TABS: 1.0000 | ORAL_TABLET | Freq: Every day | ORAL | 0 refills | Status: DC | PRN

## 2023-02-26 MED ORDER — ONDANSETRON HCL 4 MG PO TABS: 4.0000 mg | ORAL_TABLET | Freq: Three times a day (TID) | ORAL | 2 refills | Status: DC | PRN

## 2023-02-26 MED ORDER — PROCHLORPERAZINE MALEATE 10 MG PO TABS: 10.0000 mg | ORAL_TABLET | Freq: Four times a day (QID) | ORAL | 1 refills | Status: DC | PRN

## 2023-02-26 NOTE — Telephone Encounter (Signed)
Telephone call to patient's daughter to check on patient. Daughter states he is better today but still having nausea and vomited a few times this morning. He is feeling better following the IV fluids. She is asking for additional fluids tomorrow if possible. Patient scheduled in [facility] at 830. Orders in and Zofran in supportive therapy. Daughter confirms appt.

## 2023-02-26 NOTE — Telephone Encounter (Signed)
Pt daughter called for refills, see associated orders.

## 2023-02-27 ENCOUNTER — Other Ambulatory Visit: Payer: Self-pay | Admitting: Internal Medicine

## 2023-02-27 ENCOUNTER — Encounter: Payer: Self-pay | Admitting: Internal Medicine

## 2023-02-27 ENCOUNTER — Other Ambulatory Visit: Payer: Self-pay | Admitting: Pharmacist

## 2023-02-27 ENCOUNTER — Telehealth: Payer: Self-pay

## 2023-02-27 ENCOUNTER — Inpatient Hospital Stay: Payer: HMO

## 2023-02-27 VITALS — BP 152/70 | HR 99 | Temp 98.5°F | Resp 16 | Ht 69.0 in | Wt 175.0 lb

## 2023-02-27 DIAGNOSIS — E860 Dehydration: Secondary | ICD-10-CM

## 2023-02-27 DIAGNOSIS — C3411 Malignant neoplasm of upper lobe, right bronchus or lung: Secondary | ICD-10-CM

## 2023-02-27 DIAGNOSIS — Z95828 Presence of other vascular implants and grafts: Secondary | ICD-10-CM

## 2023-02-27 DIAGNOSIS — Z5112 Encounter for antineoplastic immunotherapy: Secondary | ICD-10-CM | POA: Diagnosis not present

## 2023-02-27 MED ORDER — LORAZEPAM 0.5 MG PO TABS: 0.5000 mg | ORAL_TABLET | Freq: Three times a day (TID) | ORAL | 0 refills | Status: DC | PRN

## 2023-02-27 MED ORDER — ONDANSETRON HCL 4 MG/2ML IJ SOLN
8.0000 mg | Freq: Once | INTRAMUSCULAR | Status: AC
Start: 2023-02-27 — End: 2023-02-27
  Administered 2023-02-27: 8 mg via INTRAVENOUS
  Filled 2023-02-27: qty 4

## 2023-02-27 MED ORDER — SODIUM CHLORIDE 0.9% FLUSH
10.0000 mL | Freq: Once | INTRAVENOUS | Status: AC
  Administered 2023-02-27: 10 mL

## 2023-02-27 MED ORDER — ONDANSETRON HCL 4 MG/2ML IJ SOLN: 8.0000 mg | Freq: Once | INTRAMUSCULAR | Status: DC

## 2023-02-27 MED ORDER — SODIUM CHLORIDE 0.9 % IV SOLN: Freq: Once | INTRAVENOUS | Status: AC

## 2023-02-27 MED ORDER — HEPARIN SOD (PORK) LOCK FLUSH 100 UNIT/ML IV SOLN
500.0000 [IU] | Freq: Once | INTRAVENOUS | Status: AC
  Administered 2023-02-27: 500 [IU]

## 2023-02-27 NOTE — Patient Instructions (Signed)
Nausea and Vomiting, Adult Nausea is the feeling that you have an upset stomach or that you are about to vomit. As nausea gets worse, it can lead to vomiting. Vomiting is when stomach contents forcefully come out of your mouth as a result of nausea. Vomiting can make you feel weak and cause you to become dehydrated. Dehydration can make you feel tired and thirsty, cause you to have a dry mouth, and decrease how often you urinate. Older adults and people with other diseases or a weak disease-fighting system (immune system) are at higher risk for dehydration. It is important to treat your nausea and vomiting as told by your health care provider. Follow these instructions at home: Watch your symptoms for any changes. Tell your health care provider about them. Eating and drinking     Take an oral rehydration solution (ORS). This is a drink that is sold at pharmacies and retail stores. Drink clear fluids slowly and in small amounts as you are able. Clear fluids include water, ice chips, low-calorie sports drinks, and fruit juice that has water added (diluted fruit juice). Eat bland, easy-to-digest foods in small amounts as you are able. These foods include bananas, applesauce, rice, lean meats, toast, and crackers. Avoid fluids that contain a lot of sugar or caffeine, such as energy drinks, sports drinks, and soda. Avoid alcohol. Avoid spicy or fatty foods. General instructions Take over-the-counter and prescription medicines only as told by your health care provider. Drink enough fluid to keep your urine pale yellow. Wash your hands often using soap and water for at least 20 seconds. If soap and water are not available, use hand sanitizer. Make sure that everyone in your household washes their hands well and often. Rest at home while you recover. Watch your condition for any changes. Take slow and deep breaths when you feel nauseous. Keep all follow-up visits. This is important. Contact a health  care provider if: Your symptoms get worse. You have new symptoms. You have a fever. You cannot drink fluids without vomiting. Your nausea does not go away after 2 days. You feel light-headed or dizzy. You have a headache. You have muscle cramps. You have a rash. You have pain while urinating. Get help right away if: You have pain in your chest, neck, arm, or jaw. You feel extremely weak or you faint. You have persistent vomiting. You have vomit that is bright red or looks like black coffee grounds. You have bloody or black stools (feces) or stools that look like tar. You have a severe headache, a stiff neck, or both. You have severe pain, cramping, or bloating in your abdomen. You have difficulty breathing, or you are breathing very quickly. Your heart is beating very quickly. Your skin feels cold and clammy. You feel confused. You have signs of dehydration, such as: Dark urine, very little urine, or no urine. Cracked lips. Dry mouth. Sunken eyes. Sleepiness. Weakness. These symptoms may be an emergency. Get help right away. Call 911. Do not wait to see if the symptoms will go away. Do not drive yourself to the hospital. Summary Nausea is the feeling that you have an upset stomach or that you are about to vomit. As nausea gets worse, it can lead to vomiting. Vomiting can make you feel weak and cause you to become dehydrated. Follow instructions from your health care provider about eating and drinking to prevent dehydration. Take over-the-counter and prescription medicines only as told by your health care provider. Contact your health care  provider if your symptoms get worse, or you have new symptoms. Keep all follow-up visits. This is important. This information is not intended to replace advice given to you by your health care provider. Make sure you discuss any questions you have with your health care provider. Document Revised: 07/21/2020 Document Reviewed:  07/21/2020 Elsevier Patient Education  2024 [employer].

## 2023-02-27 NOTE — Telephone Encounter (Signed)
error

## 2023-02-28 NOTE — Progress Notes (Signed)
 Palliative Medicine [facility]  [phone] [phone]) [phone]   Name: Jeffery Fernandez Date: 02/28/2023 MRN: [id number]  DOB: [birth year]  Patient Care Team: Jeffery Lenis, MD as PCP - General (Family Medicine) Jeffery Riggs, MD as PCP - Cardiology (Cardiology) Jeffery Fernandez, Jeffery SAILOR, NP as Nurse Practitioner (Hospice and Palliative Medicine) Jeffery Fernandez, Jeffery SAILOR, NP as Nurse Practitioner (Hospice and Palliative Medicine)    INTERVAL HISTORY: Jeffery Fernandez is a 78 y.o. 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 As well as COPD, DM type 2, arthritis, hypertension, and hyperlipidemia. Palliative ask to see for symptom management and goals of care.   SOCIAL HISTORY:     reports that he quit smoking about 37 years ago. His smoking use included cigarettes. He started smoking about 62 years ago. He has a 37.5 pack-year smoking history. He has never used smokeless tobacco. He reports that he does not currently use alcohol. He reports that he does not use drugs.  ADVANCE DIRECTIVES:  Advanced directives on file name Jeffery Fernandez as the primary and Jeffery Fernandez as secondary decision makers should the patient become unable to make his own decisions.  CODE STATUS: Full code  PAST MEDICAL HISTORY: Past Medical History:  Diagnosis Date   Arrhythmia    Arthritis    Cataract    bilateral cataract extraction with intraoccular lens implants   Chest pain, unspecified    Chronic airway obstruction, not elsewhere classified    PT. DENIES HE HAS COPD   CKD (chronic kidney disease)    Colon polyps    Adenomatous Polyps 2007   Depression    Diabetes mellitus    Diverticulosis    Gout    Heart murmur    History of kidney stones    Hyperlipemia    Hypertension    Nephrolithiasis    Normocytic anemia 04/23/2017   Obstructive sleep apnea (adult) (pediatric)    Other and unspecified hyperlipidemia     Other left bundle branch block    Retinal vascular occlusion, unspecified    right   Shortness of breath    on exertion   Unspecified sleep apnea    Vision loss of right eye    White coat syndrome with diagnosis of hypertension 09/19/2017    ALLERGIES:  is allergic to amlodipine , penicillins, and hydralazine .  MEDICATIONS:  Current Outpatient Medications  Medication Sig Dispense Refill   acetaminophen  (TYLENOL ) 500 MG tablet Take 1,000 mg by mouth every 6 (six) hours as needed (for pain.).     aspirin  EC 81 MG tablet Take 81 mg by mouth daily. Swallow whole.     atorvastatin  (LIPITOR) 40 MG tablet Take 40 mg by mouth at bedtime.     buPROPion  (WELLBUTRIN  XL) 150 MG 24 hr tablet Take 150 mg by mouth every morning.     busPIRone (BUSPAR) 10 MG tablet Take 10 mg by mouth in the morning.     Dulaglutide  (TRULICITY ) 1.5 MG/0.5ML SOAJ Inject 1.5 mg into the skin daily. Takes on Thursdays     gabapentin  (NEURONTIN ) 100 MG capsule Take 1 capsule (100 mg total) by mouth daily.     HYDROcodone -acetaminophen  (NORCO/VICODIN) 5-325 MG tablet Take 1 tablet by mouth daily as needed for moderate pain (pain score 4-6) or severe pain (pain score 7-10). 30 tablet 0   insulin  glargine (LANTUS ) 100 UNIT/ML injection Inject 40 Units into the skin in the morning.     lidocaine -prilocaine  (EMLA ) cream  Apply to affected area once 30 g 3   LORazepam  (ATIVAN ) 0.5 MG tablet Take 1 tablet (0.5 mg total) by mouth every 8 (eight) hours as needed for anxiety. 0.5 mg p.o. every 8 hours as needed for anxiety/nausea. 30 tablet 0   ondansetron  (ZOFRAN ) 4 MG tablet Take 1 tablet (4 mg total) by mouth every 8 (eight) hours as needed for nausea or vomiting. 30 tablet 2   prochlorperazine  (COMPAZINE ) 10 MG tablet Take 1 tablet (10 mg total) by mouth every 6 (six) hours as needed for nausea or vomiting. 30 tablet 1   sertraline  (ZOLOFT ) 100 MG tablet Take 100 mg by mouth in the morning.     torsemide  (DEMADEX ) 20 MG tablet Take  2 tablets (40 mg total) by mouth daily. (Patient taking differently: Take 40 mg by mouth in the morning.) 60 tablet 0   No current facility-administered medications for this visit.    VITAL SIGNS: There were no vitals taken for this visit. There were no vitals filed for this visit.  Estimated body mass index is 25.84 kg/m as calculated from the following:   Height as of 02/27/23: 5' 9 (1.753 m).   Weight as of 02/27/23: 175 lb (79.4 kg).   PERFORMANCE STATUS (ECOG) : 1 - Symptomatic but completely ambulatory   Physical Exam General: Weak appearing, uncomfortable in wheelchair  Cardiovascular: regular rate and rhythm Pulmonary: normal breathing pattern  Abdomen: soft, nontender, + bowel sounds Extremities: no edema, no joint deformities Skin: no rashes Neurological: AAOx3  Discussed the use of AI scribe software for clinical note transcription with the patient, who gave verbal consent to proceed.  IMPRESSION:  [name] presents to clinic for symptom management follow-up. Family is present. He is complaining of severe nausea and fatigue following recent chemotherapy. Denies constipation or diarrhea.   He has been experiencing severe nausea and fatigue since undergoing chemotherapy. Despite taking Zofran  every four hours and alternating with Compazine , and receiving Ativan  over the weekend, he continues to feel nauseated and extremely weak, unable to perform daily activities. Family shares he is spending most of his time in the bed or chair. Unable to eat due to ongoing feelings of nausea although they are continuing to push for fluids and some form of nutrition.   [name] shares he is not interested in continuing with treatment if he will feel this way after each visit. Recommended further discussions with Dr. Sherrod today regarding side effects.   He is experiencing generalized pain and has been taking hydrocodone  and Tylenol  twice a day for relief but still experiences  significant discomfort. He expresses initially he was not certain if he was feeling fatigue and sluggishness due to his pain medication, but after not taking and supplementing with Tylenol  realized his hydrocodone  was not contributing to symptoms in addition to pain was now uncontrolled. We discussed his regimen which includes hydrocodone  as needed. Reports taking 1-2 times per day. Some days are better than others. Gabapentin  100mg  daily. Unable to tolerate at higher doses or increments. Pain is controlled on regimen. No adjustments at this time.   Liem is having discomfort during visit. States his pain is severe at this time. Will plan to administer medication for pain in addition to IVF and antiemetics for symptom management today.   He reports a new symptom of pain in his mid sternal area, particularly noticeable when swallowing. He describes the pain as being in a 'weird spot' and not associated with burning. He has not  been taking Pepcid  at home, although it was administered during chemotherapy without relief. He states unsure if this is related to his vomiting episodes and maybe causing some strain to the area. Recommended use of famotidine  for support.   All questions answered and support provided.  I discussed the importance of continued conversation with family and their medical providers regarding overall plan of care and treatment options, ensuring decisions are within the context of the patients values and GOCs.  Assessment and Plan  Chemotherapy-induced Nausea and Vomiting Persistent despite Zofran , Compazine , and Ativan . Severe impact on quality of life leading to refusal of further chemotherapy if symptoms continue to persist. -Administer IV fluids, Zofran , and Pepcid  today. -Continue Zofran  and Compazine  around the clock at home.  Cancer Related Pain Generalized aching managed with Hydrocodone  and Tylenol . -Continue Hydrocodone  5/325mg  every 8 hours as needed and Tylenol  as  needed. -Administer pain medication during today's IV infusion. Morphine  2mg  may repeat x1.  -Continue Gabapentin  once daily.  Upper Gastrointestinal Discomfort New onset, localized to the upper abdomen. No burning sensation reported. Possible GERD or lymph node involvement. -Administer Pepcid  during today's IV infusion. -Consider further evaluation if symptoms persist.  General Health Maintenance -Next appointment scheduled for 03/11/2023. -Plan to reassess patient's condition during infusion next week.  Patient expressed understanding and was in agreement with this plan. He also understands that He can call the clinic at any time with any questions, concerns, or complaints.   Any controlled substances utilized were prescribed in the context of palliative care. PDMP has been reviewed.   Visit consisted of counseling and education dealing with the complex and emotionally intense issues of symptom management and palliative care in the setting of serious and potentially life-threatening illness.  Levon Borer, AGPCNP-BC  Palliative Medicine Team/[facility]

## 2023-03-01 ENCOUNTER — Inpatient Hospital Stay: Payer: HMO | Attending: Nurse Practitioner

## 2023-03-01 VITALS — BP 142/59 | HR 94 | Temp 97.9°F | Resp 16

## 2023-03-01 DIAGNOSIS — M79604 Pain in right leg: Secondary | ICD-10-CM | POA: Diagnosis not present

## 2023-03-01 DIAGNOSIS — Z5112 Encounter for antineoplastic immunotherapy: Secondary | ICD-10-CM | POA: Diagnosis present

## 2023-03-01 DIAGNOSIS — Z5111 Encounter for antineoplastic chemotherapy: Secondary | ICD-10-CM | POA: Diagnosis present

## 2023-03-01 DIAGNOSIS — C3411 Malignant neoplasm of upper lobe, right bronchus or lung: Secondary | ICD-10-CM | POA: Insufficient documentation

## 2023-03-01 DIAGNOSIS — Z95828 Presence of other vascular implants and grafts: Secondary | ICD-10-CM

## 2023-03-01 DIAGNOSIS — Z79899 Other long term (current) drug therapy: Secondary | ICD-10-CM | POA: Insufficient documentation

## 2023-03-01 MED ORDER — SODIUM CHLORIDE 0.9% FLUSH
10.0000 mL | Freq: Once | INTRAVENOUS | Status: AC
  Administered 2023-03-01: 10 mL

## 2023-03-01 MED ORDER — ONDANSETRON HCL 4 MG/2ML IJ SOLN
8.0000 mg | Freq: Once | INTRAMUSCULAR | Status: AC
  Administered 2023-03-01: 8 mg via INTRAVENOUS
  Filled 2023-03-01: qty 4

## 2023-03-01 MED ORDER — SODIUM CHLORIDE 0.9 % IV SOLN: Freq: Once | INTRAVENOUS | Status: AC

## 2023-03-01 MED ORDER — HEPARIN SOD (PORK) LOCK FLUSH 100 UNIT/ML IV SOLN
500.0000 [IU] | Freq: Once | INTRAVENOUS | Status: AC
  Administered 2023-03-01: 500 [IU]

## 2023-03-01 NOTE — Patient Instructions (Signed)

## 2023-03-04 ENCOUNTER — Inpatient Hospital Stay: Payer: HMO

## 2023-03-04 ENCOUNTER — Inpatient Hospital Stay (HOSPITAL_BASED_OUTPATIENT_CLINIC_OR_DEPARTMENT_OTHER): Payer: HMO | Admitting: Internal Medicine

## 2023-03-04 ENCOUNTER — Encounter: Payer: Self-pay | Admitting: Nurse Practitioner

## 2023-03-04 ENCOUNTER — Inpatient Hospital Stay (HOSPITAL_BASED_OUTPATIENT_CLINIC_OR_DEPARTMENT_OTHER): Payer: HMO | Admitting: Nurse Practitioner

## 2023-03-04 VITALS — BP 108/94 | HR 126 | Temp 98.2°F | Resp 17 | Ht 69.0 in | Wt 174.0 lb

## 2023-03-04 VITALS — BP 119/77 | HR 94 | Resp 16

## 2023-03-04 DIAGNOSIS — Z515 Encounter for palliative care: Secondary | ICD-10-CM

## 2023-03-04 DIAGNOSIS — G893 Neoplasm related pain (acute) (chronic): Secondary | ICD-10-CM

## 2023-03-04 DIAGNOSIS — R530 Neoplastic (malignant) related fatigue: Secondary | ICD-10-CM

## 2023-03-04 DIAGNOSIS — R110 Nausea: Secondary | ICD-10-CM

## 2023-03-04 DIAGNOSIS — C3411 Malignant neoplasm of upper lobe, right bronchus or lung: Secondary | ICD-10-CM | POA: Diagnosis not present

## 2023-03-04 DIAGNOSIS — R630 Anorexia: Secondary | ICD-10-CM

## 2023-03-04 DIAGNOSIS — Z5112 Encounter for antineoplastic immunotherapy: Secondary | ICD-10-CM | POA: Diagnosis not present

## 2023-03-04 DIAGNOSIS — Z95828 Presence of other vascular implants and grafts: Secondary | ICD-10-CM

## 2023-03-04 LAB — CBC WITH DIFFERENTIAL (CANCER CENTER ONLY)
Abs Immature Granulocytes: 0.46 10*3/uL — ABNORMAL HIGH (ref 0.00–0.07)
Basophils Absolute: 0.1 10*3/uL (ref 0.0–0.1)
Basophils Relative: 0 %
Eosinophils Absolute: 0.2 10*3/uL (ref 0.0–0.5)
Eosinophils Relative: 1 %
HCT: 30.9 % — ABNORMAL LOW (ref 39.0–52.0)
Hemoglobin: 10.3 g/dL — ABNORMAL LOW (ref 13.0–17.0)
Immature Granulocytes: 3 %
Lymphocytes Relative: 16 %
Lymphs Abs: 2.9 10*3/uL (ref 0.7–4.0)
MCH: 29.7 pg (ref 26.0–34.0)
MCHC: 33.3 g/dL (ref 30.0–36.0)
MCV: 89 fL (ref 80.0–100.0)
Monocytes Absolute: 1 10*3/uL (ref 0.1–1.0)
Monocytes Relative: 5 %
Neutro Abs: 13.5 10*3/uL — ABNORMAL HIGH (ref 1.7–7.7)
Neutrophils Relative %: 75 %
Platelet Count: 163 10*3/uL (ref 150–400)
RBC: 3.47 MIL/uL — ABNORMAL LOW (ref 4.22–5.81)
RDW: 15.8 % — ABNORMAL HIGH (ref 11.5–15.5)
WBC Count: 18 10*3/uL — ABNORMAL HIGH (ref 4.0–10.5)
nRBC: 0 % (ref 0.0–0.2)

## 2023-03-04 LAB — CMP (CANCER CENTER ONLY)
ALT: 11 U/L (ref 0–44)
AST: 14 U/L — ABNORMAL LOW (ref 15–41)
Albumin: 3.2 g/dL — ABNORMAL LOW (ref 3.5–5.0)
Alkaline Phosphatase: 178 U/L — ABNORMAL HIGH (ref 38–126)
Anion gap: 9 (ref 5–15)
BUN: 28 mg/dL — ABNORMAL HIGH (ref 8–23)
CO2: 29 mmol/L (ref 22–32)
Calcium: 8.3 mg/dL — ABNORMAL LOW (ref 8.9–10.3)
Chloride: 101 mmol/L (ref 98–111)
Creatinine: 2.64 mg/dL — ABNORMAL HIGH (ref 0.61–1.24)
GFR, Estimated: 24 mL/min — ABNORMAL LOW (ref >60)
Glucose, Bld: 117 mg/dL — ABNORMAL HIGH (ref 70–99)
Potassium: 2.8 mmol/L — ABNORMAL LOW (ref 3.5–5.1)
Sodium: 139 mmol/L (ref 135–145)
Total Bilirubin: 0.4 mg/dL (ref 0.0–1.2)
Total Protein: 6.7 g/dL (ref 6.5–8.1)

## 2023-03-04 MED ORDER — ONDANSETRON HCL 4 MG/2ML IJ SOLN
8.0000 mg | Freq: Once | INTRAMUSCULAR | Status: AC
Start: 2023-03-04 — End: 2023-03-04
  Administered 2023-03-04: 8 mg via INTRAVENOUS
  Filled 2023-03-04: qty 4

## 2023-03-04 MED ORDER — HEPARIN SOD (PORK) LOCK FLUSH 100 UNIT/ML IV SOLN
500.0000 [IU] | Freq: Once | INTRAVENOUS | Status: AC
  Administered 2023-03-04: 500 [IU]

## 2023-03-04 MED ORDER — POTASSIUM CHLORIDE IN NACL 20-0.9 MEQ/L-% IV SOLN
INTRAVENOUS | Status: DC
  Filled 2023-03-04: qty 1000

## 2023-03-04 MED ORDER — MORPHINE SULFATE (PF) 2 MG/ML IV SOLN
2.0000 mg | INTRAVENOUS | Status: DC | PRN
  Administered 2023-03-04: 2 mg via INTRAVENOUS
  Filled 2023-03-04: qty 1

## 2023-03-04 MED ORDER — FAMOTIDINE 20 MG PO TABS: 20.0000 mg | ORAL_TABLET | Freq: Two times a day (BID) | ORAL | 3 refills | Status: DC

## 2023-03-04 MED ORDER — SODIUM CHLORIDE 0.9% FLUSH
10.0000 mL | Freq: Once | INTRAVENOUS | Status: AC
  Administered 2023-03-04: 10 mL

## 2023-03-04 NOTE — Progress Notes (Signed)
[facility] Telephone:([phone]) [phone]   Fax:([phone]) [phone]  OFFICE PROGRESS NOTE  [name], MD [address] [place] [address]  DIAGNOSIS: Stage IV (T2b, N3, M1b) non-small cell lung cancer, adenocarcinoma . This was diagnosed in December 2024 and presented with large right apical lung mass in addition to right hilar, mediastinal and supraclavicular lymphadenopathy as well as pulmonary nodules in the right upper lobe and right lower lobe with T10 bone metastasis in addition to medial left inguinal lymph node versus subcutaneous nodule.   Biomarker Findings HRD signature - HRDsig Negative Microsatellite status - Cannot Be Determined ? Tumor Mutational Burden - 8 Muts/Mb Genomic Findings For a complete list of the genes assayed, please refer to the Appendix. CDKN2A loss KEAP1 K32fs*60 STK11 E291* CDKN2B loss IKZF1 R111* TP53 R280K 8 Disease relevant genes with no reportable alterations: ALK, BRAF, EGFR, ERBB2, KRAS, MET, RET, ROS1  PRIOR THERAPY: Palliative radiotherapy to the T10 bone metastasis.  CURRENT THERAPY: First-line treatment with chemoimmunotherapy in the form of carboplatin for AUC of 5 and paclitaxel 175 Mg/M2 on days 1 and 22 in addition to ipilimumab 1 Mg/KG on day 1 and nivolumab 360 Mg IV on days 1 and 22 for 1 cycle followed by maintenance treatment with ipilimumab 1 Mg/KG every 6 weeks and nivolumab 360 Mg IV every 3 weeks.  First dose February 18, 2023.  INTERVAL HISTORY: Jeffery Fernandez 78 y.o. male returns to the clinic today for follow-up visit accompanied by his wife and daughter. Discussed the use of AI scribe software for clinical note transcription with the patient, who gave verbal consent to proceed.  History of Present Illness   Jeffery Fernandez is a 78 year old male with stage four non-small cell lung cancer adenocarcinoma who presents with nausea and vomiting following chemo-immunotherapy. He is accompanied by his  daughter and wife.  He was diagnosed with stage four non-small cell lung cancer adenocarcinoma in December 2024 and received palliative radiation to a T10 spinal lesion. Two weeks ago, he began chemo-immunotherapy with carboplatin, paclitaxel, ipilimumab, and nivolumab.  Since starting this treatment, he has experienced significant nausea and vomiting. In the past week, he received intravenous fluids multiple times, approximately every other day, to manage dehydration. Despite this, he continues to experience severe nausea and vomiting, which worsened yesterday and today. He has been using Ativan primarily at night to manage symptoms, but it causes drowsiness and has not been effective in controlling the nausea and vomiting. He also enjoys popsicles for his cooling effect, but today he was unable to keep them down.  His current medications include Ativan, which is administered at night, and he has been prescribed Zofran for nausea. His family reports that he has something in his system at all times to manage symptoms, but relief has been minimal.      MEDICAL HISTORY: Past Medical History:  Diagnosis Date   Arrhythmia    Arthritis    Cataract    bilateral cataract extraction with intraoccular lens implants   Chest pain, unspecified    Chronic airway obstruction, not elsewhere classified    PT. DENIES HE HAS COPD   CKD (chronic kidney disease)    Colon polyps    Adenomatous Polyps 2007   Depression    Diabetes mellitus    Diverticulosis    Gout    Heart murmur    History of kidney stones    Hyperlipemia    Hypertension  Nephrolithiasis    Normocytic anemia 04/23/2017   Obstructive sleep apnea (adult) (pediatric)    Other and unspecified hyperlipidemia    Other left bundle branch block    Retinal vascular occlusion, unspecified    right   Shortness of breath    on exertion   Unspecified sleep apnea    Vision loss of right eye    White coat syndrome with diagnosis of  hypertension 09/19/2017    ALLERGIES:  is allergic to amlodipine, penicillins, and hydralazine.  MEDICATIONS:  Current Outpatient Medications  Medication Sig Dispense Refill   acetaminophen (TYLENOL) 500 MG tablet Take 1,000 mg by mouth every 6 (six) hours as needed (for pain.).     aspirin EC 81 MG tablet Take 81 mg by mouth daily. Swallow whole.     atorvastatin (LIPITOR) 40 MG tablet Take 40 mg by mouth at bedtime.     buPROPion (WELLBUTRIN XL) 150 MG 24 hr tablet Take 150 mg by mouth every morning.     busPIRone (BUSPAR) 10 MG tablet Take 10 mg by mouth in the morning.     Dulaglutide (TRULICITY) 1.5 MG/0.5ML SOAJ Inject 1.5 mg into the skin daily. Takes on Thursdays     gabapentin (NEURONTIN) 100 MG capsule Take 1 capsule (100 mg total) by mouth daily.     HYDROcodone-acetaminophen (NORCO/VICODIN) 5-325 MG tablet Take 1 tablet by mouth daily as needed for moderate pain (pain score 4-6) or severe pain (pain score 7-10). 30 tablet 0   insulin glargine (LANTUS) 100 UNIT/ML injection Inject 40 Units into the skin in the morning.     lidocaine-prilocaine (EMLA) cream Apply to affected area once 30 g 3   LORazepam (ATIVAN) 0.5 MG tablet Take 1 tablet (0.5 mg total) by mouth every 8 (eight) hours as needed for anxiety. 0.5 mg p.o. every 8 hours as needed for anxiety/nausea. 30 tablet 0   ondansetron (ZOFRAN) 4 MG tablet Take 1 tablet (4 mg total) by mouth every 8 (eight) hours as needed for nausea or vomiting. 30 tablet 2   prochlorperazine (COMPAZINE) 10 MG tablet Take 1 tablet (10 mg total) by mouth every 6 (six) hours as needed for nausea or vomiting. 30 tablet 1   sertraline (ZOLOFT) 100 MG tablet Take 100 mg by mouth in the morning.     torsemide (DEMADEX) 20 MG tablet Take 2 tablets (40 mg total) by mouth daily. (Patient taking differently: Take 40 mg by mouth in the morning.) 60 tablet 0   No current facility-administered medications for this visit.    SURGICAL HISTORY:  Past Surgical  History:  Procedure Laterality Date   ANTERIOR CERVICAL DECOMP/DISCECTOMY FUSION N/A 06/26/2016   Procedure: Re-Exploration of Cervical Wound;  Surgeon: Donalee Citrin, MD;  Location: [facility] OR;  Service: Neurosurgery;  Laterality: N/A;   ANTERIOR CERVICAL DECOMP/DISCECTOMY FUSION N/A 06/26/2016   Procedure: ANTERIOR CERVICAL DECOMPRESSION/DISCECTOMY FUSION, INTERBODY PROSTESIS, PLATE, CERVICAL THREE CERVICAL FOUR, CERVICAL FOUR CERVICAL FIVE CERVICAL SIX;  Surgeon: Tressie Stalker, MD;  Location: [facility] OR;  Service: Neurosurgery;  Laterality: N/A;   APPLICATION OF WOUND VAC  06/05/2020   Procedure: APPLICATION OF WOUND VAC;  Surgeon: Tressie Stalker, MD;  Location: [facility] OR;  Service: Neurosurgery;;   BACK SURGERY     CATARACT EXTRACTION Bilateral    IR IMAGING GUIDED PORT INSERTION  02/19/2023   KIDNEY STONE SURGERY     KYPHOPLASTY N/A 01/19/2018   Procedure: KYPHOPLASTY LUMBAR THREE;  Surgeon: Tressie Stalker, MD;  Location: [facility] OR;  Service:  Neurosurgery;  Laterality: N/A;  KYPHOPLASTY LUMBAR THREE   LUMBAR WOUND DEBRIDEMENT N/A 06/05/2020   Procedure: LUMBAR WOUND DEBRIDEMENT;  Surgeon: Tressie Stalker, MD;  Location: [facility] OR;  Service: Neurosurgery;  Laterality: N/A;    REVIEW OF SYSTEMS:  Constitutional: positive for anorexia, fatigue, and weight loss Eyes: negative Ears, nose, mouth, throat, and face: negative Respiratory: positive for cough Cardiovascular: negative Gastrointestinal: positive for nausea Genitourinary:negative Integument/breast: negative Hematologic/lymphatic: negative Musculoskeletal:positive for arthralgias and muscle weakness Neurological: negative Behavioral/Psych: negative Endocrine: negative Allergic/Immunologic: negative   PHYSICAL EXAMINATION: General appearance: alert, cooperative, fatigued, and no distress Head: Normocephalic, without obvious abnormality, atraumatic Neck: no adenopathy, no JVD, supple, symmetrical, trachea midline, and thyroid not enlarged, symmetric,  no tenderness/mass/nodules Lymph nodes: Cervical, supraclavicular, and axillary nodes normal. Resp: clear to auscultation bilaterally Back: symmetric, no curvature. ROM normal. No CVA tenderness. Cardio: regular rate and rhythm, S1, S2 normal, no murmur, click, rub or gallop GI: soft, non-tender; bowel sounds normal; no masses,  no organomegaly Extremities: extremities normal, atraumatic, no cyanosis or edema Neurologic: Alert and oriented X 3, normal strength and tone. Normal symmetric reflexes. Normal coordination and gait  ECOG PERFORMANCE STATUS: 1 - Symptomatic but completely ambulatory  Blood pressure (!) 108/94, pulse (!) 126, temperature 98.2 F (36.8 C), temperature source Temporal, resp. rate 17, height 5\' 9"  (1.753 m), weight 174 lb (78.9 kg), SpO2 93%.  LABORATORY DATA: Lab Results  Component Value Date   WBC 18.0 (H) 03/04/2023   HGB 10.3 (L) 03/04/2023   HCT 30.9 (L) 03/04/2023   MCV 89.0 03/04/2023   PLT 163 03/04/2023      Chemistry      Component Value Date/Time   NA 139 03/04/2023 1053   NA 142 07/03/2022 1356   K 2.8 (L) 03/04/2023 1053   CL 101 03/04/2023 1053   CO2 29 03/04/2023 1053   BUN 28 (H) 03/04/2023 1053   BUN 22 07/03/2022 1356   CREATININE 2.64 (H) 03/04/2023 1053      Component Value Date/Time   CALCIUM 8.3 (L) 03/04/2023 1053   ALKPHOS 178 (H) 03/04/2023 1053   AST 14 (L) 03/04/2023 1053   ALT 11 03/04/2023 1053   BILITOT 0.4 03/04/2023 1053       RADIOGRAPHIC STUDIES: DG Chest Portable 1 View Result Date: 02/20/2023 CLINICAL DATA:  Fever and sore throat right-sided lung cancer EXAM: PORTABLE CHEST 1 VIEW COMPARISON:  X-ray 12/31/2022. FINDINGS: New left IJ port with tip along the SVC right atrial junction. Film is rotated to the right. Enlarged cardiopericardial silhouette calcified aorta. Left lung is grossly clear without consolidation, pneumothorax or effusion. There is some hazy opacity along the right upper lung. Some nodularity.  Please correlate for right upper lobe mass as seen on prior PET-CT scan. No right-sided effusion or new consolidation. Fixation hardware along the lower cervical spine. IMPRESSION: Masslike opacity in the right upper lung extending to the hilum consistent with known diagnosis. Chest port. Enlarged heart. Electronically Signed   By: Karen Kays M.D.   On: 02/20/2023 19:11   IR IMAGING GUIDED PORT INSERTION Result Date: 02/19/2023 INDICATION: History of lung cancer. In need of durable intravenous access for chemotherapy administration. EXAM: IMPLANTED PORT A CATH PLACEMENT WITH ULTRASOUND AND FLUOROSCOPIC GUIDANCE COMPARISON:  PET-CT-12/16/2022 MEDICATIONS: None ANESTHESIA/SEDATION: Moderate (conscious) sedation was employed during this procedure as administered by the Interventional Radiology RN. A total of Versed 2 mg and Fentanyl 100 mcg was administered intravenously. Moderate Sedation Time: 20 minutes. The patient's level of  consciousness and vital signs were monitored continuously by radiology nursing throughout the procedure under my direct supervision. CONTRAST:  None FLUOROSCOPY TIME:  1 minute, 6 seconds (6 mGy) COMPLICATIONS: None immediate. PROCEDURE: The procedure, risks, benefits, and alternatives were explained to the patient. Questions regarding the procedure were encouraged and answered. The patient understands and consents to the procedure. Given the presence of known hypermetabolic right cervical lymphadenopathy, the decision was made to proceed with left internal jugular approach port a catheter placement. The left neck and chest were prepped with chlorhexidine in a sterile fashion, and a sterile drape was applied covering the operative field. Maximum barrier sterile technique with sterile gowns and gloves were used for the procedure. A timeout was performed prior to the initiation of the procedure. Local anesthesia was provided with 1% lidocaine with epinephrine. After creating a small  venotomy incision, a micropuncture kit was utilized to access the internal jugular vein. Real-time ultrasound guidance was utilized for vascular access including the acquisition of a permanent ultrasound image documenting patency of the accessed vessel. The microwire was utilized to measure appropriate catheter length. A subcutaneous port pocket was then created along the upper chest wall utilizing a combination of sharp and blunt dissection. The pocket was irrigated with sterile saline. A single lumen ISP sized power injectable port was chosen for placement. The 8 Fr catheter was tunneled from the port pocket site to the venotomy incision. The port was placed in the pocket. The external catheter was trimmed to appropriate length. At the venotomy, an 8 Fr peel-away sheath was placed over a guidewire under fluoroscopic guidance. The catheter was then placed through the sheath and the sheath was removed. Final catheter positioning was confirmed and documented with a fluoroscopic spot radiograph. The port was accessed with a Huber needle, aspirated and flushed with heparinized saline. The venotomy site was closed with an interrupted 4-0 Vicryl suture. The port pocket incision was closed with interrupted 2-0 Vicryl suture. Dermabond and Steri-strips were applied to both incisions. Dressings were applied. The patient tolerated the procedure well without immediate post procedural complication. FINDINGS: After catheter placement, the tip lies within the superior cavoatrial junction. The catheter aspirates and flushes normally and is ready for immediate use. IMPRESSION: Successful placement of a left internal jugular approach power injectable Port-A-Cath. The catheter is ready for immediate use. Electronically Signed   By: Simonne Come M.D.   On: 02/19/2023 12:48    ASSESSMENT AND PLAN: This is a very pleasant 78 years old white male with  Stage IV (T2b, N3, M1b) non-small cell lung cancer, adenocarcinoma . This was  diagnosed in December 2024 and presented with large right apical lung mass in addition to right hilar, mediastinal and supraclavicular lymphadenopathy as well as pulmonary nodules in the right upper lobe and right lower lobe with T10 bone metastasis in addition to medial left inguinal lymph node versus subcutaneous nodule.  Molecular studies showed no actionable mutation and there was insufficient material for PD-L1 expression. The patient status post palliative radiotherapy to the T10 vertebral body lesion. The patient is currently undergoing first-line treatment with chemoimmunotherapy in the form of carboplatin for AUC of 5 and paclitaxel 175 Mg/M2 on days 1 and 22 in addition to ipilimumab 1 Mg/KG on day 1 and nivolumab 360 Mg IV on days 1 and 22 for 1 cycle followed by maintenance treatment with ipilimumab 1 Mg/KG every 6 weeks and nivolumab 360 Mg IV every 3 weeks.  First dose February 18, 2023.  He  is status post 1 cycle.  He has a rough time with his treatment.     Stage IV Non-Small Cell Lung Cancer (Adenocarcinoma) Diagnosed in December 2024. Currently undergoing chemo-immunotherapy with carboplatin, paclitaxel, ipilimumab, and nivolumab. Experiencing significant side effects including nausea and vomiting, likely due to chemotherapy. Hemoglobin improved to 10.3 from 9.9. White blood cell count recovered from 2000 to 18000. Potassium is low, contributing to weakness. Discussed potential discontinuation of chemotherapy due to side effects and continuation with immunotherapy only, which has fewer side effects. Explained that immunotherapy is generally easier to tolerate and does not require Neulasta injections. - Administer 1 liter of normal saline with 20 mEq of potassium chloride - Administer 8 mg of Zofran IV for nausea - Consider discontinuing chemotherapy and continuing with immunotherapy only if symptoms improve - Re-evaluate next week to determine the next steps in treatment  Nausea and  Vomiting Persistent nausea and vomiting despite current medications, including Ativan. Symptoms exacerbated by chemotherapy. Ativan is being administered primarily at night due to its sedative effects. - Administer 8 mg of Zofran IV - Adjust antiemetic regimen as needed  Hypokalemia Low potassium levels contributing to weakness. Discussed the option of potassium supplementation at home, though it may be challenging due to the size of the pills. - Administer 1 liter of normal saline with 20 mEq of potassium chloride - Consider potassium supplementation at home if necessary  Radiation-Induced Esophagitis Complaints of pain while swallowing, likely due to radiation therapy effects on the T10 spinal lesion. - Provide supportive care as needed  Follow-up - Re-evaluate next week to determine the next steps in treatment.   The patient was advised to call immediately if he has any concerning symptoms in the interval. The patient voices understanding of current disease status and treatment options and is in agreement with the current care plan.  All questions were answered. The patient knows to call the clinic with any problems, questions or concerns. We can certainly see the patient much sooner if necessary.  The total time spent in the appointment was 30 minutes.  Disclaimer: This note was dictated with voice recognition software. Similar sounding words can inadvertently be transcribed and may not be corrected upon review.

## 2023-03-04 NOTE — Patient Instructions (Signed)

## 2023-03-10 MED FILL — Fosaprepitant Dimeglumine For IV Infusion 150 MG (Base Eq): INTRAVENOUS | Qty: 5 | Status: AC

## 2023-03-11 ENCOUNTER — Inpatient Hospital Stay (HOSPITAL_BASED_OUTPATIENT_CLINIC_OR_DEPARTMENT_OTHER): Payer: HMO | Admitting: Internal Medicine

## 2023-03-11 ENCOUNTER — Inpatient Hospital Stay: Payer: HMO

## 2023-03-11 ENCOUNTER — Encounter: Payer: Self-pay | Admitting: Medical Oncology

## 2023-03-11 ENCOUNTER — Inpatient Hospital Stay (HOSPITAL_BASED_OUTPATIENT_CLINIC_OR_DEPARTMENT_OTHER): Payer: HMO | Admitting: Nurse Practitioner

## 2023-03-11 VITALS — BP 130/78 | HR 84 | Temp 98.2°F | Resp 16

## 2023-03-11 VITALS — BP 128/72 | HR 91 | Temp 98.8°F | Resp 17 | Wt 173.6 lb

## 2023-03-11 DIAGNOSIS — C3411 Malignant neoplasm of upper lobe, right bronchus or lung: Secondary | ICD-10-CM | POA: Diagnosis not present

## 2023-03-11 DIAGNOSIS — Z95828 Presence of other vascular implants and grafts: Secondary | ICD-10-CM

## 2023-03-11 DIAGNOSIS — R530 Neoplastic (malignant) related fatigue: Secondary | ICD-10-CM | POA: Diagnosis not present

## 2023-03-11 DIAGNOSIS — Z515 Encounter for palliative care: Secondary | ICD-10-CM | POA: Diagnosis not present

## 2023-03-11 DIAGNOSIS — G893 Neoplasm related pain (acute) (chronic): Secondary | ICD-10-CM

## 2023-03-11 DIAGNOSIS — E876 Hypokalemia: Secondary | ICD-10-CM

## 2023-03-11 DIAGNOSIS — Z5112 Encounter for antineoplastic immunotherapy: Secondary | ICD-10-CM | POA: Diagnosis not present

## 2023-03-11 LAB — CBC WITH DIFFERENTIAL (CANCER CENTER ONLY)
Abs Immature Granulocytes: 0.14 10*3/uL — ABNORMAL HIGH (ref 0.00–0.07)
Basophils Absolute: 0.1 10*3/uL (ref 0.0–0.1)
Basophils Relative: 1 %
Eosinophils Absolute: 0.1 10*3/uL (ref 0.0–0.5)
Eosinophils Relative: 1 %
HCT: 26.8 % — ABNORMAL LOW (ref 39.0–52.0)
Hemoglobin: 9 g/dL — ABNORMAL LOW (ref 13.0–17.0)
Immature Granulocytes: 1 %
Lymphocytes Relative: 18 %
Lymphs Abs: 2 10*3/uL (ref 0.7–4.0)
MCH: 29.9 pg (ref 26.0–34.0)
MCHC: 33.6 g/dL (ref 30.0–36.0)
MCV: 89 fL (ref 80.0–100.0)
Monocytes Absolute: 0.8 10*3/uL (ref 0.1–1.0)
Monocytes Relative: 8 %
Neutro Abs: 7.8 10*3/uL — ABNORMAL HIGH (ref 1.7–7.7)
Neutrophils Relative %: 71 %
Platelet Count: 218 10*3/uL (ref 150–400)
RBC: 3.01 MIL/uL — ABNORMAL LOW (ref 4.22–5.81)
RDW: 17.1 % — ABNORMAL HIGH (ref 11.5–15.5)
WBC Count: 10.9 10*3/uL — ABNORMAL HIGH (ref 4.0–10.5)
nRBC: 0 % (ref 0.0–0.2)

## 2023-03-11 LAB — CMP (CANCER CENTER ONLY)
ALT: 10 U/L (ref 0–44)
AST: 12 U/L — ABNORMAL LOW (ref 15–41)
Albumin: 2.9 g/dL — ABNORMAL LOW (ref 3.5–5.0)
Alkaline Phosphatase: 149 U/L — ABNORMAL HIGH (ref 38–126)
Anion gap: 8 (ref 5–15)
BUN: 29 mg/dL — ABNORMAL HIGH (ref 8–23)
CO2: 29 mmol/L (ref 22–32)
Calcium: 8 mg/dL — ABNORMAL LOW (ref 8.9–10.3)
Chloride: 101 mmol/L (ref 98–111)
Creatinine: 2.68 mg/dL — ABNORMAL HIGH (ref 0.61–1.24)
GFR, Estimated: 23 mL/min — ABNORMAL LOW (ref >60)
Glucose, Bld: 195 mg/dL — ABNORMAL HIGH (ref 70–99)
Potassium: 3 mmol/L — ABNORMAL LOW (ref 3.5–5.1)
Sodium: 138 mmol/L (ref 135–145)
Total Bilirubin: 0.4 mg/dL (ref 0.0–1.2)
Total Protein: 6.2 g/dL — ABNORMAL LOW (ref 6.5–8.1)

## 2023-03-11 MED ORDER — POTASSIUM CHLORIDE 10 MEQ/100ML IV SOLN
10.0000 meq | INTRAVENOUS | Status: AC
  Administered 2023-03-11 (×2): 10 meq via INTRAVENOUS
  Filled 2023-03-11 (×2): qty 100

## 2023-03-11 MED ORDER — SODIUM CHLORIDE 0.9 % IV SOLN: INTRAVENOUS | Status: DC

## 2023-03-11 MED ORDER — HEPARIN SOD (PORK) LOCK FLUSH 100 UNIT/ML IV SOLN
500.0000 [IU] | Freq: Once | INTRAVENOUS | Status: AC | PRN
  Administered 2023-03-11: 500 [IU]

## 2023-03-11 MED ORDER — HYDROCODONE-ACETAMINOPHEN 5-325 MG PO TABS
1.0000 | ORAL_TABLET | Freq: Three times a day (TID) | ORAL | 0 refills | Status: DC | PRN
Start: 2023-03-11 — End: 2023-03-20

## 2023-03-11 MED ORDER — SODIUM CHLORIDE 0.9% FLUSH
10.0000 mL | INTRAVENOUS | Status: DC | PRN
  Administered 2023-03-11: 10 mL

## 2023-03-11 MED ORDER — SODIUM CHLORIDE 0.9 % IV SOLN
360.0000 mg | Freq: Once | INTRAVENOUS | Status: AC
  Administered 2023-03-11: 360 mg via INTRAVENOUS
  Filled 2023-03-11: qty 12

## 2023-03-11 MED ORDER — SODIUM CHLORIDE 0.9% FLUSH
10.0000 mL | Freq: Once | INTRAVENOUS | Status: AC
Start: 2023-03-11 — End: 2023-03-11
  Administered 2023-03-11: 10 mL

## 2023-03-11 NOTE — Progress Notes (Signed)
[facility] [phone]   [phone]  OFFICE PROGRESS NOTE  [name], MD [address] [place] [address]  DIAGNOSIS: Stage IV (T2b, N3, M1b) non-small cell lung cancer, adenocarcinoma . This was diagnosed in December 2024 and presented with large right apical lung mass in addition to right hilar, mediastinal and supraclavicular lymphadenopathy as well as pulmonary nodules in the right upper lobe and right lower lobe with T10 bone metastasis in addition to medial left inguinal lymph node versus subcutaneous nodule.   Biomarker Findings HRD signature - HRDsig Negative Microsatellite status - Cannot Be Determined ? Tumor Mutational Burden - 8 Muts/Mb Genomic Findings For a complete list of the genes assayed, please refer to the Appendix. CDKN2A loss KEAP1 K68fs*60 STK11 E291* CDKN2B loss IKZF1 R111* TP53 R280K 8 Disease relevant genes with no reportable alterations: ALK, BRAF, EGFR, ERBB2, KRAS, MET, RET, ROS1  PRIOR THERAPY: Palliative radiotherapy to the T10 bone metastasis.  CURRENT THERAPY: First-line treatment with chemoimmunotherapy in the form of carboplatin for AUC of 5 and paclitaxel 175 Mg/M2 on days 1 and 22 in addition to ipilimumab 1 Mg/KG on day 1 and nivolumab 360 Mg IV on days 1 and 22 for 1 cycle followed by maintenance treatment with ipilimumab 1 Mg/KG every 6 weeks and nivolumab 360 Mg IV every 3 weeks.  First dose February 18, 2023.  Chemotherapy was discontinued starting from day 22 of cycle #1 secondary to intolerance.  INTERVAL HISTORY: Jeffery Fernandez 78 y.o. male returns to the clinic today for follow-up visit accompanied by his daughter.Discussed the use of AI scribe software for clinical note transcription with the patient, who gave verbal consent to proceed.  History of Present Illness   Jeffery Fernandez is a 78 year old male with non-small cell lung cancer who presents for follow-up after  chemotherapy treatment.  He was diagnosed with non-small cell lung cancer, specifically adenocarcinoma, in December 2024. He underwent chemotherapy with carboplatin and paclitaxel, Nivolumab and Ipilumumab four weeks ago, experiencing significant side effects including severe sickness, necessitating frequent monitoring and fluid administration.  Currently, he feels 'pretty good' but has had intermittent periods of feeling 'pretty bad' since the treatment. He attributes much of his discomfort to the Neulasta injection, which was administered to boost his white blood cell count and caused more trouble than the chemotherapy itself.  He is now receiving treatment with nivolumab only, without chemotherapy, due to the adverse effects experienced previously.  He is experiencing significant pain in his right leg, which may be related to radiation treatment. No back pain is present, which could indicate radiating pain from the back.       MEDICAL HISTORY: Past Medical History:  Diagnosis Date   Arrhythmia    Arthritis    Cataract    bilateral cataract extraction with intraoccular lens implants   Chest pain, unspecified    Chronic airway obstruction, not elsewhere classified    PT. DENIES HE HAS COPD   CKD (chronic kidney disease)    Colon polyps    Adenomatous Polyps 2007   Depression    Diabetes mellitus    Diverticulosis    Gout    Heart murmur    History of kidney stones    Hyperlipemia    Hypertension    Nephrolithiasis    Normocytic anemia 04/23/2017   Obstructive sleep apnea (adult) (pediatric)    Other and unspecified hyperlipidemia    Other left bundle  branch block    Retinal vascular occlusion, unspecified    right   Shortness of breath    on exertion   Unspecified sleep apnea    Vision loss of right eye    White coat syndrome with diagnosis of hypertension 09/19/2017    ALLERGIES:  is allergic to amlodipine, penicillins, and hydralazine.  MEDICATIONS:  Current  Outpatient Medications  Medication Sig Dispense Refill   acetaminophen (TYLENOL) 500 MG tablet Take 1,000 mg by mouth every 6 (six) hours as needed (for pain.).     aspirin EC 81 MG tablet Take 81 mg by mouth daily. Swallow whole.     atorvastatin (LIPITOR) 40 MG tablet Take 40 mg by mouth at bedtime.     buPROPion (WELLBUTRIN XL) 150 MG 24 hr tablet Take 150 mg by mouth every morning.     busPIRone (BUSPAR) 10 MG tablet Take 10 mg by mouth in the morning.     Dulaglutide (TRULICITY) 1.5 MG/0.5ML SOAJ Inject 1.5 mg into the skin daily. Takes on Thursdays     famotidine (PEPCID) 20 MG tablet Take 1 tablet (20 mg total) by mouth 2 (two) times daily. 60 tablet 3   gabapentin (NEURONTIN) 100 MG capsule Take 1 capsule (100 mg total) by mouth daily.     HYDROcodone-acetaminophen (NORCO/VICODIN) 5-325 MG tablet Take 1 tablet by mouth daily as needed for moderate pain (pain score 4-6) or severe pain (pain score 7-10). 30 tablet 0   insulin glargine (LANTUS) 100 UNIT/ML injection Inject 40 Units into the skin in the morning.     lidocaine-prilocaine (EMLA) cream Apply to affected area once 30 g 3   LORazepam (ATIVAN) 0.5 MG tablet Take 1 tablet (0.5 mg total) by mouth every 8 (eight) hours as needed for anxiety. 0.5 mg p.o. every 8 hours as needed for anxiety/nausea. 30 tablet 0   ondansetron (ZOFRAN) 4 MG tablet Take 1 tablet (4 mg total) by mouth every 8 (eight) hours as needed for nausea or vomiting. 30 tablet 2   prochlorperazine (COMPAZINE) 10 MG tablet Take 1 tablet (10 mg total) by mouth every 6 (six) hours as needed for nausea or vomiting. 30 tablet 1   sertraline (ZOLOFT) 100 MG tablet Take 100 mg by mouth in the morning.     torsemide (DEMADEX) 20 MG tablet Take 2 tablets (40 mg total) by mouth daily. (Patient taking differently: Take 40 mg by mouth in the morning.) 60 tablet 0   No current facility-administered medications for this visit.    SURGICAL HISTORY:  Past Surgical History:   Procedure Laterality Date   ANTERIOR CERVICAL DECOMP/DISCECTOMY FUSION N/A 06/26/2016   Procedure: Re-Exploration of Cervical Wound;  Surgeon: Donalee Citrin, MD;  Location: [facility] OR;  Service: Neurosurgery;  Laterality: N/A;   ANTERIOR CERVICAL DECOMP/DISCECTOMY FUSION N/A 06/26/2016   Procedure: ANTERIOR CERVICAL DECOMPRESSION/DISCECTOMY FUSION, INTERBODY PROSTESIS, PLATE, CERVICAL THREE CERVICAL FOUR, CERVICAL FOUR CERVICAL FIVE CERVICAL SIX;  Surgeon: Tressie Stalker, MD;  Location: [facility] OR;  Service: Neurosurgery;  Laterality: N/A;   APPLICATION OF WOUND VAC  06/05/2020   Procedure: APPLICATION OF WOUND VAC;  Surgeon: Tressie Stalker, MD;  Location: [facility] OR;  Service: Neurosurgery;;   BACK SURGERY     CATARACT EXTRACTION Bilateral    IR IMAGING GUIDED PORT INSERTION  02/19/2023   KIDNEY STONE SURGERY     KYPHOPLASTY N/A 01/19/2018   Procedure: KYPHOPLASTY LUMBAR THREE;  Surgeon: Tressie Stalker, MD;  Location: [facility] OR;  Service: Neurosurgery;  Laterality: N/A;  KYPHOPLASTY LUMBAR THREE   LUMBAR WOUND DEBRIDEMENT N/A 06/05/2020   Procedure: LUMBAR WOUND DEBRIDEMENT;  Surgeon: Tressie Stalker, MD;  Location: [facility] OR;  Service: Neurosurgery;  Laterality: N/A;    REVIEW OF SYSTEMS:  Constitutional: positive for fatigue Eyes: negative Ears, nose, mouth, throat, and face: negative Respiratory: positive for dyspnea on exertion Cardiovascular: negative Gastrointestinal: positive for nausea Genitourinary:negative Integument/breast: negative Hematologic/lymphatic: negative Musculoskeletal:positive for arthralgias Neurological: negative Behavioral/Psych: negative Endocrine: negative Allergic/Immunologic: negative   PHYSICAL EXAMINATION: General appearance: alert, cooperative, fatigued, and no distress Head: Normocephalic, without obvious abnormality, atraumatic Neck: no adenopathy, no JVD, supple, symmetrical, trachea midline, and thyroid not enlarged, symmetric, no tenderness/mass/nodules Lymph nodes:  Cervical, supraclavicular, and axillary nodes normal. Resp: clear to auscultation bilaterally Back: symmetric, no curvature. ROM normal. No CVA tenderness. Cardio: regular rate and rhythm, S1, S2 normal, no murmur, click, rub or gallop GI: soft, non-tender; bowel sounds normal; no masses,  no organomegaly Extremities: extremities normal, atraumatic, no cyanosis or edema Neurologic: Alert and oriented X 3, normal strength and tone. Normal symmetric reflexes. Normal coordination and gait  ECOG PERFORMANCE STATUS: 1 - Symptomatic but completely ambulatory  Blood pressure 128/72, pulse 91, temperature 98.8 F (37.1 C), temperature source Temporal, resp. rate 17, weight 173 lb 9.6 oz (78.7 kg), SpO2 98%.  LABORATORY DATA: Lab Results  Component Value Date   WBC 18.0 (H) 03/04/2023   HGB 10.3 (L) 03/04/2023   HCT 30.9 (L) 03/04/2023   MCV 89.0 03/04/2023   PLT 163 03/04/2023      Chemistry      Component Value Date/Time   NA 139 03/04/2023 1053   NA 142 07/03/2022 1356   K 2.8 (L) 03/04/2023 1053   CL 101 03/04/2023 1053   CO2 29 03/04/2023 1053   BUN 28 (H) 03/04/2023 1053   BUN 22 07/03/2022 1356   CREATININE 2.64 (H) 03/04/2023 1053      Component Value Date/Time   CALCIUM 8.3 (L) 03/04/2023 1053   ALKPHOS 178 (H) 03/04/2023 1053   AST 14 (L) 03/04/2023 1053   ALT 11 03/04/2023 1053   BILITOT 0.4 03/04/2023 1053       RADIOGRAPHIC STUDIES: DG Chest Portable 1 View Result Date: 02/20/2023 CLINICAL DATA:  Fever and sore throat right-sided lung cancer EXAM: PORTABLE CHEST 1 VIEW COMPARISON:  X-ray 12/31/2022. FINDINGS: New left IJ port with tip along the SVC right atrial junction. Film is rotated to the right. Enlarged cardiopericardial silhouette calcified aorta. Left lung is grossly clear without consolidation, pneumothorax or effusion. There is some hazy opacity along the right upper lung. Some nodularity. Please correlate for right upper lobe mass as seen on prior PET-CT  scan. No right-sided effusion or new consolidation. Fixation hardware along the lower cervical spine. IMPRESSION: Masslike opacity in the right upper lung extending to the hilum consistent with known diagnosis. Chest port. Enlarged heart. Electronically Signed   By: Karen Kays M.D.   On: 02/20/2023 19:11   IR IMAGING GUIDED PORT INSERTION Result Date: 02/19/2023 INDICATION: History of lung cancer. In need of durable intravenous access for chemotherapy administration. EXAM: IMPLANTED PORT A CATH PLACEMENT WITH ULTRASOUND AND FLUOROSCOPIC GUIDANCE COMPARISON:  PET-CT-12/16/2022 MEDICATIONS: None ANESTHESIA/SEDATION: Moderate (conscious) sedation was employed during this procedure as administered by the Interventional Radiology RN. A total of Versed 2 mg and Fentanyl 100 mcg was administered intravenously. Moderate Sedation Time: 20 minutes. The patient's level of consciousness and vital signs were monitored continuously by radiology nursing throughout the procedure under my  direct supervision. CONTRAST:  None FLUOROSCOPY TIME:  1 minute, 6 seconds (6 mGy) COMPLICATIONS: None immediate. PROCEDURE: The procedure, risks, benefits, and alternatives were explained to the patient. Questions regarding the procedure were encouraged and answered. The patient understands and consents to the procedure. Given the presence of known hypermetabolic right cervical lymphadenopathy, the decision was made to proceed with left internal jugular approach port a catheter placement. The left neck and chest were prepped with chlorhexidine in a sterile fashion, and a sterile drape was applied covering the operative field. Maximum barrier sterile technique with sterile gowns and gloves were used for the procedure. A timeout was performed prior to the initiation of the procedure. Local anesthesia was provided with 1% lidocaine with epinephrine. After creating a small venotomy incision, a micropuncture kit was utilized to access the internal  jugular vein. Real-time ultrasound guidance was utilized for vascular access including the acquisition of a permanent ultrasound image documenting patency of the accessed vessel. The microwire was utilized to measure appropriate catheter length. A subcutaneous port pocket was then created along the upper chest wall utilizing a combination of sharp and blunt dissection. The pocket was irrigated with sterile saline. A single lumen ISP sized power injectable port was chosen for placement. The 8 Fr catheter was tunneled from the port pocket site to the venotomy incision. The port was placed in the pocket. The external catheter was trimmed to appropriate length. At the venotomy, an 8 Fr peel-away sheath was placed over a guidewire under fluoroscopic guidance. The catheter was then placed through the sheath and the sheath was removed. Final catheter positioning was confirmed and documented with a fluoroscopic spot radiograph. The port was accessed with a Huber needle, aspirated and flushed with heparinized saline. The venotomy site was closed with an interrupted 4-0 Vicryl suture. The port pocket incision was closed with interrupted 2-0 Vicryl suture. Dermabond and Steri-strips were applied to both incisions. Dressings were applied. The patient tolerated the procedure well without immediate post procedural complication. FINDINGS: After catheter placement, the tip lies within the superior cavoatrial junction. The catheter aspirates and flushes normally and is ready for immediate use. IMPRESSION: Successful placement of a left internal jugular approach power injectable Port-A-Cath. The catheter is ready for immediate use. Electronically Signed   By: Simonne Come M.D.   On: 02/19/2023 12:48    ASSESSMENT AND PLAN: This is a very pleasant 78 years old white male with  Stage IV (T2b, N3, M1b) non-small cell lung cancer, adenocarcinoma . This was diagnosed in December 2024 and presented with large right apical lung mass in  addition to right hilar, mediastinal and supraclavicular lymphadenopathy as well as pulmonary nodules in the right upper lobe and right lower lobe with T10 bone metastasis in addition to medial left inguinal lymph node versus subcutaneous nodule.  Molecular studies showed no actionable mutation and there was insufficient material for PD-L1 expression. The patient status post palliative radiotherapy to the T10 vertebral body lesion. The patient is currently undergoing first-line treatment with chemoimmunotherapy in the form of carboplatin for AUC of 5 and paclitaxel 175 Mg/M2 on days 1 and 22 in addition to ipilimumab 1 Mg/KG on day 1 and nivolumab 360 Mg IV on days 1 and 22 for 1 cycle followed by maintenance treatment with ipilimumab 1 Mg/KG every 6 weeks and nivolumab 360 Mg IV every 3 weeks.  First dose February 18, 2023.  He is status post 1 cycle.  He has a rough time with his treatment.  He had a rough time with the first cycle of his treatment with chemotherapy which will be discontinued starting from day 22 of cycle #1. The patient will continue on immunotherapy only with ipilimumab 1 Mg/Kg every 6 weeks in addition to nivolumab 360 Mg IV every 3 weeks for up to 2 years as long as the patient does not have any disease progression or unacceptable toxicity. He will proceed with day 22 of cycle #1 with only single agent nivolumab today.     Non-Small Cell Lung Cancer (NSCLC) Mr. Mahan, diagnosed with adenocarcinoma in December 2024, experienced severe adverse effects from the first cycle of carboplatin and paclitaxel chemotherapy with Nivolumab and Ipilumumab. He prefers to avoid these side effects. Options discussed included dose reduction or switching to immunotherapy. Given his adverse reaction, the decision was made to proceed with nivolumab alone, which is generally better tolerated. - Administer nivolumab infusion today. - Discontinue carboplatin and paclitaxel. - Monitor blood counts  closely. - Schedule follow-up in three weeks for the next cycle of immunotherapy.  Adverse Effects of Chemotherapy Mr. Stankey experienced severe sickness and complications from the Neulasta injection during the first chemotherapy cycle. He prefers to avoid these side effects in future treatments. - Discontinue Neulasta injection. - Monitor blood counts closely and provide supportive care as needed.  Pain in Right Leg Mr. Carrizales reports significant right leg pain, potentially related to radiation therapy. The pain may be radiating from the back, though no specific back pain was reported. - Evaluate the source of leg pain and consider potential radiation-related causes.  General Health Maintenance Mr. Counterman is advised to maintain good hydration, nutrition, and activity levels to support overall health during treatment. - Encourage hydration and good nutrition. - Encourage staying active.  Follow-up - Schedule follow-up appointment in three weeks for the next cycle of immunotherapy.   The patient was advised to call immediately if he has any concerning symptoms in the interval. The patient voices understanding of current disease status and treatment options and is in agreement with the current care plan.  All questions were answered. The patient knows to call the clinic with any problems, questions or concerns. We can certainly see the patient much sooner if necessary.  The total time spent in the appointment was 30 minutes.  Disclaimer: This note was dictated with voice recognition software. Similar sounding words can inadvertently be transcribed and may not be corrected upon review.

## 2023-03-11 NOTE — Progress Notes (Signed)
Palliative Medicine [facility]  Telephone:([phone]) [phone] [phone]   Name: Jeffery Fernandez Date: 03/11/2023 MRN: [id number]  DOB: 1945/02/12  Patient Care Team: [name], MD as PCP - General (Family Medicine) Chilton Si, MD as PCP - Cardiology (Cardiology) Pickenpack-Cousar, Arty Baumgartner, NP as Nurse Practitioner (Hospice and Palliative Medicine) Pickenpack-Cousar, Arty Baumgartner, NP as Nurse Practitioner (Hospice and Palliative Medicine)    INTERVAL HISTORY: Jeffery Fernandez is a 78 y.o. 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 As well as COPD, DM type 2, arthritis, hypertension, and hyperlipidemia. Palliative ask to see for symptom management and goals of care.   SOCIAL HISTORY:     reports that he quit smoking about 37 years ago. His smoking use included cigarettes. He started smoking about 62 years ago. He has a 37.5 pack-year smoking history. He has never used smokeless tobacco. He reports that he does not currently use alcohol. He reports that he does not use drugs.  ADVANCE DIRECTIVES:  Advanced directives on file name Jeffery Fernandez as the primary and [name] as secondary decision makers should the patient become unable to make his own decisions.  CODE STATUS: Full code  PAST MEDICAL HISTORY: Past Medical History:  Diagnosis Date   Arrhythmia    Arthritis    Cataract    bilateral cataract extraction with intraoccular lens implants   Chest pain, unspecified    Chronic airway obstruction, not elsewhere classified    PT. DENIES HE HAS COPD   CKD (chronic kidney disease)    Colon polyps    Adenomatous Polyps 2007   Depression    Diabetes mellitus    Diverticulosis    Gout    Heart murmur    History of kidney stones    Hyperlipemia    Hypertension    Nephrolithiasis    Normocytic anemia 04/23/2017   Obstructive sleep apnea (adult) (pediatric)    Other and unspecified hyperlipidemia     Other left bundle branch block    Retinal vascular occlusion, unspecified    right   Shortness of breath    on exertion   Unspecified sleep apnea    Vision loss of right eye    White coat syndrome with diagnosis of hypertension 09/19/2017    ALLERGIES:  is allergic to amlodipine, penicillins, and hydralazine.  MEDICATIONS:  Current Outpatient Medications  Medication Sig Dispense Refill   acetaminophen (TYLENOL) 500 MG tablet Take 1,000 mg by mouth every 6 (six) hours as needed (for pain.).     aspirin EC 81 MG tablet Take 81 mg by mouth daily. Swallow whole.     atorvastatin (LIPITOR) 40 MG tablet Take 40 mg by mouth at bedtime.     buPROPion (WELLBUTRIN XL) 150 MG 24 hr tablet Take 150 mg by mouth every morning.     busPIRone (BUSPAR) 10 MG tablet Take 10 mg by mouth in the morning.     Dulaglutide (TRULICITY) 1.5 MG/0.5ML SOAJ Inject 1.5 mg into the skin daily. Takes on Thursdays     famotidine (PEPCID) 20 MG tablet Take 1 tablet (20 mg total) by mouth 2 (two) times daily. 60 tablet 3   gabapentin (NEURONTIN) 100 MG capsule Take 1 capsule (100 mg total) by mouth daily.     HYDROcodone-acetaminophen (NORCO/VICODIN) 5-325 MG tablet Take 1 tablet by mouth daily as needed for moderate pain (pain score 4-6) or severe pain (pain score 7-10). 30 tablet 0  insulin glargine (LANTUS) 100 UNIT/ML injection Inject 40 Units into the skin in the morning.     lidocaine-prilocaine (EMLA) cream Apply to affected area once 30 g 3   LORazepam (ATIVAN) 0.5 MG tablet Take 1 tablet (0.5 mg total) by mouth every 8 (eight) hours as needed for anxiety. 0.5 mg p.o. every 8 hours as needed for anxiety/nausea. 30 tablet 0   ondansetron (ZOFRAN) 4 MG tablet Take 1 tablet (4 mg total) by mouth every 8 (eight) hours as needed for nausea or vomiting. 30 tablet 2   prochlorperazine (COMPAZINE) 10 MG tablet Take 1 tablet (10 mg total) by mouth every 6 (six) hours as needed for nausea or vomiting. 30 tablet 1    sertraline (ZOLOFT) 100 MG tablet Take 100 mg by mouth in the morning.     torsemide (DEMADEX) 20 MG tablet Take 2 tablets (40 mg total) by mouth daily. (Patient taking differently: Take 40 mg by mouth in the morning.) 60 tablet 0   No current facility-administered medications for this visit.   Facility-Administered Medications Ordered in Other Visits  Medication Dose Route Frequency Provider Last Rate Last Admin   0.9 %  sodium chloride infusion   Intravenous Continuous Si Gaul, MD 10 mL/hr at 03/11/23 0921 New Bag at 03/11/23 0921   heparin lock flush 100 unit/mL  500 Units Intracatheter Once PRN Si Gaul, MD       nivolumab (OPDIVO) 360 mg in sodium chloride 0.9 % 100 mL chemo infusion  360 mg Intravenous Once Si Gaul, MD       potassium chloride 10 mEq in 100 mL IVPB  10 mEq Intravenous Q1 Hr x 2 Si Gaul, MD 100 mL/hr at 03/11/23 0923 10 mEq at 03/11/23 1517   sodium chloride flush (NS) 0.9 % injection 10 mL  10 mL Intracatheter PRN Si Gaul, MD        VITAL SIGNS: There were no vitals taken for this visit. There were no vitals filed for this visit.  Estimated body mass index is 25.64 kg/m as calculated from the following:   Height as of 03/04/23: 5\' 9"  (1.753 m).   Weight as of an earlier encounter on 03/11/23: 173 lb 9.6 oz (78.7 kg).   PERFORMANCE STATUS (ECOG) : 1 - Symptomatic but completely ambulatory   Physical Exam General: Weak appearing, uncomfortable in wheelchair  Cardiovascular: regular rate and rhythm Pulmonary: normal breathing pattern  Abdomen: soft, nontender, + bowel sounds Extremities: no edema, no joint deformities Skin: no rashes Neurological: AAOx3  Discussed the use of AI scribe software for clinical note transcription with the patient, who gave verbal consent to proceed.  IMPRESSION:  I saw [name] during his infusion for symptom management follow-up. No acute distress noted. He is feeling much better  compared to last visit. Endorses ongoing fatigue but improved. Denies concerns with constipation, diarrhea, or vomiting. Occasional nausea which is controlled with Zofran. Reports appetite is slowly improving. Shares he had a good day on yesterday and was able to eat the best he had in over a week.   He experiences some pain, primarily in his back, which sometimes radiates to his leg. The pain is a recurring issue that affects his daily living at times depending on severity, although it is not severe today. He is much appreciative of this. He is currently prescribed hydrocodone for pain and feels this is effective when taking. Gabapentin 100mg  daily. Unable to tolerate higher doses or more than once a  day. Would like to continue with this regimen for now. No adjustments made.    Patient states desires to continue to treat the treatable with focus on his quality of life while managing symptoms. Shares he initially was considering no further treatment however is in agreement to continue with current immunotherapy as discussed with Dr. Arbutus Ped.   We will continue to closely monitor and support.  I discussed the importance of continued conversation with family and their medical providers regarding overall plan of care and treatment options, ensuring decisions are within the context of the patients values and GOCs.  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 No new changes or concerns at this time. Taking things one day at a time.  -Continue current pain management regimen. -Hydrocodone 5/325mg  every 8 hours as needed -Continue Gabapentin 100mg  daily  Home Care Assistance Patient expressed need for home care assistance. Medicare currently not covering costs. -Consult Child psychotherapist for potential resources. -Explore options with [employer] for temporary assistance.  Nausea No current complaints of nausea. Much improved. Appetite slowly  improving  -Continue Zofran 8mg  as needed.  Follow-Up I will plan to see patient back in office in 2-3 weeks. Sooner if needed.  Patient expressed understanding and was in agreement with this plan. He also understands that He can call the clinic at any time with any questions, concerns, or complaints.   Any controlled substances utilized were prescribed in the context of palliative care. PDMP has been reviewed.   Visit consisted of counseling and education dealing with the complex and emotionally intense issues of symptom management and palliative care in the setting of serious and potentially life-threatening illness.  Willette Alma, AGPCNP-BC  Palliative Medicine Team/[facility]

## 2023-03-11 NOTE — Progress Notes (Unsigned)
Per Dr. Arbutus Ped ,it is ok to treat pt today with Nivolumab and creatinine =2.68.

## 2023-03-11 NOTE — Patient Instructions (Addendum)
[facility] - A DEPT OF [facility]  Discharge Instructions: Thank you for choosing [facility] to provide your oncology and hematology care.   If you have a lab appointment with the Cancer Center, please go directly to the Cancer Center and check in at the registration area.   Wear comfortable clothing and clothing appropriate for easy access to any Portacath or PICC line.   We strive to give you quality time with your provider. You may need to reschedule your appointment if you arrive late (15 or more minutes).  Arriving late affects you and other patients whose appointments are after yours.  Also, if you miss three or more appointments without notifying the office, you may be dismissed from the clinic at the provider's discretion.      For prescription refill requests, have your pharmacy contact our office and allow 72 hours for refills to be completed.    Today you received the following chemotherapy and/or immunotherapy agent: Nivolumab (Opdivo)      To help prevent nausea and vomiting after your treatment, we encourage you to take your nausea medication as directed.  BELOW ARE SYMPTOMS THAT SHOULD BE REPORTED IMMEDIATELY: *FEVER GREATER THAN 100.4 F (38 C) OR HIGHER *CHILLS OR SWEATING *NAUSEA AND VOMITING THAT IS NOT CONTROLLED WITH YOUR NAUSEA MEDICATION *UNUSUAL SHORTNESS OF BREATH *UNUSUAL BRUISING OR BLEEDING *URINARY PROBLEMS (pain or burning when urinating, or frequent urination) *BOWEL PROBLEMS (unusual diarrhea, constipation, pain near the anus) TENDERNESS IN MOUTH AND THROAT WITH OR WITHOUT PRESENCE OF ULCERS (sore throat, sores in mouth, or a toothache) UNUSUAL RASH, SWELLING OR PAIN  UNUSUAL VAGINAL DISCHARGE OR ITCHING   Items with * indicate a potential emergency and should be followed up as soon as possible or go to the Emergency Department if any problems should occur.  Please show the CHEMOTHERAPY ALERT CARD or  IMMUNOTHERAPY ALERT CARD at check-in to the Emergency Department and triage nurse.  Should you have questions after your visit or need to cancel or reschedule your appointment, please contact [facility] - A DEPT OF [facility]  Dept: [phone]  and follow the prompts.  Office hours are 8:00 a.m. to 4:30 p.m. Monday - Friday. Please note that voicemails left after 4:00 p.m. may not be returned until the following business day.  We are closed weekends and major holidays. You have access to a nurse at all times for urgent questions. Please call the main number to the clinic Dept: [phone] and follow the prompts.   For any non-urgent questions, you may also contact your provider using MyChart. We now offer e-Visits for anyone 61 and older to request care online for non-urgent symptoms. For details visit [email or web address].   Also download the MyChart app! Go to the app store, search "MyChart", open the app, select [place], and log in with your MyChart username and password.  Nivolumab Injection What is this medication? NIVOLUMAB (nye VOL ue mab) treats some types of cancer. It works by helping your immune system slow or stop the spread of cancer cells. It is a monoclonal antibody. This medicine may be used for other purposes; ask your health care provider or pharmacist if you have questions. COMMON BRAND NAME(S): Opdivo What should I tell my care team before I take this medication? They need to know if you have any of these conditions: Allogeneic stem cell transplant (uses someone else's stem cells)  Autoimmune diseases, such as Crohn disease, ulcerative colitis, lupus History of chest radiation Nervous system problems, such as Guillain-Barre syndrome or myasthenia gravis Organ transplant An unusual or allergic reaction to nivolumab, other medications, foods, dyes, or preservatives Pregnant or trying to get pregnant Breast-feeding How should I use  this medication? This medication is infused into a vein. It is given in a hospital or clinic setting. A special MedGuide will be given to you before each treatment. Be sure to read this information carefully each time. Talk to your care team about the use of this medication in children. While it may be prescribed for children as young as 12 years for selected conditions, precautions do apply. Overdosage: If you think you have taken too much of this medicine contact a poison control center or emergency room at once. NOTE: This medicine is only for you. Do not share this medicine with others. What if I miss a dose? Keep appointments for follow-up doses. It is important not to miss your dose. Call your care team if you are unable to keep an appointment. What may interact with this medication? Interactions have not been studied. This list may not describe all possible interactions. Give your health care provider a list of all the medicines, herbs, non-prescription drugs, or dietary supplements you use. Also tell them if you smoke, drink alcohol, or use illegal drugs. Some items may interact with your medicine. What should I watch for while using this medication? Your condition will be monitored carefully while you are receiving this medication. You may need blood work while taking this medication. This medication may cause serious skin reactions. They can happen weeks to months after starting the medication. Contact your care team right away if you notice fevers or flu-like symptoms with a rash. The rash may be red or purple and then turn into blisters or peeling of the skin. You may also notice a red rash with swelling of the face, lips, or lymph nodes in your neck or under your arms. Tell your care team right away if you have any change in your eyesight. Talk to your care team if you are pregnant or think you might be pregnant. A negative pregnancy test is required before starting this medication. A  reliable form of contraception is recommended while taking this medication and for 5 months after the last dose. Talk to your care team about effective forms of contraception. Do not breast-feed while taking this medication and for 5 months after the last dose. What side effects may I notice from receiving this medication? Side effects that you should report to your care team as soon as possible: Allergic reactions--skin rash, itching, hives, swelling of the face, lips, tongue, or throat Dry cough, shortness of breath or trouble breathing Eye pain, redness, irritation, or discharge with blurry or decreased vision Heart muscle inflammation--unusual weakness or fatigue, shortness of breath, chest pain, fast or irregular heartbeat, dizziness, swelling of the ankles, feet, or hands Hormone gland problems--headache, sensitivity to light, unusual weakness or fatigue, dizziness, fast or irregular heartbeat, increased sensitivity to cold or heat, excessive sweating, constipation, hair loss, increased thirst or amount of urine, tremors or shaking, irritability Infusion reactions--chest pain, shortness of breath or trouble breathing, feeling faint or lightheaded Kidney injury (glomerulonephritis)--decrease in the amount of urine, red or dark brown urine, foamy or bubbly urine, swelling of the ankles, hands, or feet Liver injury--right upper belly pain, loss of appetite, nausea, light-colored stool, dark yellow or brown urine, yellowing skin  or eyes, unusual weakness or fatigue Pain, tingling, or numbness in the hands or feet, muscle weakness, change in vision, confusion or trouble speaking, loss of balance or coordination, trouble walking, seizures Rash, fever, and swollen lymph nodes Redness, blistering, peeling, or loosening of the skin, including inside the mouth Sudden or severe stomach pain, bloody diarrhea, fever, nausea, vomiting Side effects that usually do not require medical attention (report these  to your care team if they continue or are bothersome): Bone, joint, or muscle pain Diarrhea Fatigue Loss of appetite Nausea Skin rash This list may not describe all possible side effects. Call your doctor for medical advice about side effects. You may report side effects to FDA at 1-800-FDA-1088. Where should I keep my medication? This medication is given in a hospital or clinic. It will not be stored at home. NOTE: This sheet is a summary. It may not cover all possible information. If you have questions about this medicine, talk to your doctor, pharmacist, or health care provider.  2024 Elsevier/Gold Standard (2021-05-14 00:00:00) Hypokalemia Hypokalemia means that the amount of potassium in the blood is lower than normal. Potassium is a mineral (electrolyte) that helps regulate the amount of fluid in the body. It also stimulates muscle tightening (contraction) and helps nerves work properly. Normally, most of the body's potassium is inside cells, and only a very small amount is in the blood. Because the amount in the blood is so small, minor changes to potassium levels in the blood can be life-threatening. What are the causes? This condition may be caused by: Antibiotic medicine. Diarrhea or vomiting. Taking too much of a medicine that helps you have a bowel movement (laxative) can cause diarrhea and lead to hypokalemia. Chronic kidney disease (CKD). Medicines that help the body get rid of excess fluid (diuretics). Eating disorders, such as anorexia or bulimia. Low magnesium levels in the body. Sweating a lot. What are the signs or symptoms? Symptoms of this condition include: Weakness. Constipation. Fatigue. Muscle cramps. Mental confusion. Skipped heartbeats or irregular heartbeat (palpitations). Tingling or numbness. How is this diagnosed? This condition is diagnosed with a blood test. How is this treated? This condition may be treated by: Taking potassium  supplements. Adjusting the medicines that you take. Eating more foods that contain a lot of potassium. If your potassium level is very low, you may need to get potassium through an IV and be monitored in the hospital. Follow these instructions at home: Eating and drinking  Eat a healthy diet. A healthy diet includes fresh fruits and vegetables, whole grains, healthy fats, and lean proteins. If told, eat more foods that contain a lot of potassium. These include: Nuts, such as peanuts and pistachios. Seeds, such as sunflower seeds and pumpkin seeds. Peas, lentils, and lima beans. Whole grain and bran cereals and breads. Fresh fruits and vegetables, such as apricots, avocado, bananas, cantaloupe, kiwi, oranges, tomatoes, asparagus, and potatoes. Juices, such as orange, tomato, and prune. Lean meats, including fish. Milk and milk products, such as yogurt. General instructions Take over-the-counter and prescription medicines only as told by your health care provider. This includes vitamins, natural food products, and supplements. Keep all follow-up visits. This is important. Contact a health care provider if: You have weakness that gets worse. You feel your heart pounding or racing. You vomit. You have diarrhea. You have diabetes and you have trouble keeping your blood sugar in your target range. Get help right away if: You have chest pain. You have shortness of breath.  You have vomiting or diarrhea that lasts for more than 2 days. You faint. These symptoms may be an emergency. Get help right away. Call 911. Do not wait to see if the symptoms will go away. Do not drive yourself to the hospital. Summary Hypokalemia means that the amount of potassium in the blood is lower than normal. This condition is diagnosed with a blood test. Hypokalemia may be treated by taking potassium supplements, adjusting the medicines that you take, or eating more foods that are high in potassium. If your  potassium level is very low, you may need to get potassium through an IV and be monitored in the hospital. This information is not intended to replace advice given to you by your health care provider. Make sure you discuss any questions you have with your health care provider. Document Revised: 09/28/2020 Document Reviewed: 09/28/2020 Elsevier Patient Education  2024 [employer].

## 2023-03-13 ENCOUNTER — Ambulatory Visit: Payer: HMO

## 2023-03-18 ENCOUNTER — Inpatient Hospital Stay: Payer: HMO

## 2023-03-18 ENCOUNTER — Encounter: Payer: Self-pay | Admitting: Internal Medicine

## 2023-03-18 DIAGNOSIS — Z95828 Presence of other vascular implants and grafts: Secondary | ICD-10-CM

## 2023-03-18 DIAGNOSIS — Z5112 Encounter for antineoplastic immunotherapy: Secondary | ICD-10-CM | POA: Diagnosis not present

## 2023-03-18 DIAGNOSIS — C3411 Malignant neoplasm of upper lobe, right bronchus or lung: Secondary | ICD-10-CM

## 2023-03-18 LAB — CMP (CANCER CENTER ONLY)
ALT: 11 U/L (ref 0–44)
AST: 15 U/L (ref 15–41)
Albumin: 2.9 g/dL — ABNORMAL LOW (ref 3.5–5.0)
Alkaline Phosphatase: 138 U/L — ABNORMAL HIGH (ref 38–126)
Anion gap: 9 (ref 5–15)
BUN: 28 mg/dL — ABNORMAL HIGH (ref 8–23)
CO2: 28 mmol/L (ref 22–32)
Calcium: 8.3 mg/dL — ABNORMAL LOW (ref 8.9–10.3)
Chloride: 102 mmol/L (ref 98–111)
Creatinine: 2.72 mg/dL — ABNORMAL HIGH (ref 0.61–1.24)
GFR, Estimated: 23 mL/min — ABNORMAL LOW (ref >60)
Glucose, Bld: 202 mg/dL — ABNORMAL HIGH (ref 70–99)
Potassium: 3.5 mmol/L (ref 3.5–5.1)
Sodium: 139 mmol/L (ref 135–145)
Total Bilirubin: 0.5 mg/dL (ref 0.0–1.2)
Total Protein: 6.5 g/dL (ref 6.5–8.1)

## 2023-03-18 LAB — CBC WITH DIFFERENTIAL (CANCER CENTER ONLY)
Abs Immature Granulocytes: 0.1 10*3/uL — ABNORMAL HIGH (ref 0.00–0.07)
Basophils Absolute: 0.1 10*3/uL (ref 0.0–0.1)
Basophils Relative: 1 %
Eosinophils Absolute: 0.1 10*3/uL (ref 0.0–0.5)
Eosinophils Relative: 0 %
HCT: 28.2 % — ABNORMAL LOW (ref 39.0–52.0)
Hemoglobin: 9.3 g/dL — ABNORMAL LOW (ref 13.0–17.0)
Immature Granulocytes: 1 %
Lymphocytes Relative: 22 %
Lymphs Abs: 2.7 10*3/uL (ref 0.7–4.0)
MCH: 30.1 pg (ref 26.0–34.0)
MCHC: 33 g/dL (ref 30.0–36.0)
MCV: 91.3 fL (ref 80.0–100.0)
Monocytes Absolute: 0.7 10*3/uL (ref 0.1–1.0)
Monocytes Relative: 6 %
Neutro Abs: 8.4 10*3/uL — ABNORMAL HIGH (ref 1.7–7.7)
Neutrophils Relative %: 70 %
Platelet Count: 206 10*3/uL (ref 150–400)
RBC: 3.09 MIL/uL — ABNORMAL LOW (ref 4.22–5.81)
RDW: 18.3 % — ABNORMAL HIGH (ref 11.5–15.5)
WBC Count: 12 10*3/uL — ABNORMAL HIGH (ref 4.0–10.5)
nRBC: 0 % (ref 0.0–0.2)

## 2023-03-18 MED ORDER — SODIUM CHLORIDE 0.9% FLUSH
10.0000 mL | Freq: Once | INTRAVENOUS | Status: AC
  Administered 2023-03-18: 10 mL

## 2023-03-18 MED ORDER — HEPARIN SOD (PORK) LOCK FLUSH 100 UNIT/ML IV SOLN
500.0000 [IU] | Freq: Once | INTRAVENOUS | Status: AC
  Administered 2023-03-18: 500 [IU]

## 2023-03-20 ENCOUNTER — Ambulatory Visit: Payer: Self-pay | Admitting: Radiation Oncology

## 2023-03-20 ENCOUNTER — Other Ambulatory Visit: Payer: Self-pay | Admitting: Nurse Practitioner

## 2023-03-20 DIAGNOSIS — C3411 Malignant neoplasm of upper lobe, right bronchus or lung: Secondary | ICD-10-CM

## 2023-03-20 DIAGNOSIS — Z515 Encounter for palliative care: Secondary | ICD-10-CM

## 2023-03-20 DIAGNOSIS — G893 Neoplasm related pain (acute) (chronic): Secondary | ICD-10-CM

## 2023-03-20 MED ORDER — HYDROCODONE-ACETAMINOPHEN 5-325 MG PO TABS: 1.0000 | ORAL_TABLET | Freq: Four times a day (QID) | ORAL | 0 refills | Status: DC | PRN

## 2023-03-20 NOTE — Addendum Note (Signed)
Encounter addended by: Edward Qualia on: 03/20/2023 10:53 AM  Actions taken: Imaging Exam ended

## 2023-03-21 ENCOUNTER — Ambulatory Visit: Payer: HMO | Admitting: Emergency Medicine

## 2023-03-25 ENCOUNTER — Inpatient Hospital Stay: Payer: HMO

## 2023-03-25 ENCOUNTER — Encounter: Payer: Self-pay | Admitting: Internal Medicine

## 2023-03-25 VITALS — BP 100/73 | HR 96 | Temp 98.0°F | Resp 16

## 2023-03-25 DIAGNOSIS — Z95828 Presence of other vascular implants and grafts: Secondary | ICD-10-CM

## 2023-03-25 DIAGNOSIS — C3411 Malignant neoplasm of upper lobe, right bronchus or lung: Secondary | ICD-10-CM

## 2023-03-25 DIAGNOSIS — Z5112 Encounter for antineoplastic immunotherapy: Secondary | ICD-10-CM | POA: Diagnosis not present

## 2023-03-25 LAB — CBC WITH DIFFERENTIAL (CANCER CENTER ONLY)
Abs Immature Granulocytes: 0.04 10*3/uL (ref 0.00–0.07)
Basophils Absolute: 0.1 10*3/uL (ref 0.0–0.1)
Basophils Relative: 1 %
Eosinophils Absolute: 0.1 10*3/uL (ref 0.0–0.5)
Eosinophils Relative: 1 %
HCT: 27.8 % — ABNORMAL LOW (ref 39.0–52.0)
Hemoglobin: 9.1 g/dL — ABNORMAL LOW (ref 13.0–17.0)
Immature Granulocytes: 0 %
Lymphocytes Relative: 27 %
Lymphs Abs: 2.6 10*3/uL (ref 0.7–4.0)
MCH: 30.8 pg (ref 26.0–34.0)
MCHC: 32.7 g/dL (ref 30.0–36.0)
MCV: 94.2 fL (ref 80.0–100.0)
Monocytes Absolute: 0.7 10*3/uL (ref 0.1–1.0)
Monocytes Relative: 8 %
Neutro Abs: 6.3 10*3/uL (ref 1.7–7.7)
Neutrophils Relative %: 63 %
Platelet Count: 228 10*3/uL (ref 150–400)
RBC: 2.95 MIL/uL — ABNORMAL LOW (ref 4.22–5.81)
RDW: 18.5 % — ABNORMAL HIGH (ref 11.5–15.5)
WBC Count: 9.8 10*3/uL (ref 4.0–10.5)
nRBC: 0 % (ref 0.0–0.2)

## 2023-03-25 LAB — CMP (CANCER CENTER ONLY)
ALT: 9 U/L (ref 0–44)
AST: 12 U/L — ABNORMAL LOW (ref 15–41)
Albumin: 3 g/dL — ABNORMAL LOW (ref 3.5–5.0)
Alkaline Phosphatase: 126 U/L (ref 38–126)
Anion gap: 7 (ref 5–15)
BUN: 29 mg/dL — ABNORMAL HIGH (ref 8–23)
CO2: 29 mmol/L (ref 22–32)
Calcium: 8.4 mg/dL — ABNORMAL LOW (ref 8.9–10.3)
Chloride: 103 mmol/L (ref 98–111)
Creatinine: 2.95 mg/dL — ABNORMAL HIGH (ref 0.61–1.24)
GFR, Estimated: 21 mL/min — ABNORMAL LOW (ref >60)
Glucose, Bld: 138 mg/dL — ABNORMAL HIGH (ref 70–99)
Potassium: 3.8 mmol/L (ref 3.5–5.1)
Sodium: 139 mmol/L (ref 135–145)
Total Bilirubin: 0.4 mg/dL (ref 0.0–1.2)
Total Protein: 6.9 g/dL (ref 6.5–8.1)

## 2023-03-25 MED ORDER — HEPARIN SOD (PORK) LOCK FLUSH 100 UNIT/ML IV SOLN
500.0000 [IU] | Freq: Once | INTRAVENOUS | Status: AC
  Administered 2023-03-25: 500 [IU]

## 2023-03-25 MED ORDER — SODIUM CHLORIDE 0.9% FLUSH
10.0000 mL | Freq: Once | INTRAVENOUS | Status: AC
  Administered 2023-03-25: 10 mL

## 2023-03-26 ENCOUNTER — Other Ambulatory Visit: Payer: Self-pay | Admitting: Internal Medicine

## 2023-03-26 ENCOUNTER — Encounter: Payer: Self-pay | Admitting: Radiation Oncology

## 2023-03-26 DIAGNOSIS — D539 Nutritional anemia, unspecified: Secondary | ICD-10-CM

## 2023-03-26 NOTE — Progress Notes (Signed)
  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      Indication for treatment:  palliative        Radiation treatment dates:      First Treatment Date: 2023-01-27   Last Treatment Date: 2023-02-10  Site/Dose/Technique/Mode:  Plan Name: Spine_T Site: Thoracic Spine Technique: 3D Mode: Photon Dose Per Fraction: 3 Gy Prescribed Dose (Delivered / Prescribed): 30 Gy / 30 Gy Prescribed Fxs (Delivered / Prescribed): 10 / 10  Narrative: The patient tolerated radiation treatment relatively well. Patient denied experiencing any fatigue, skin changes, or pain as a result of treatment.    Plan: The patient has completed radiation treatment. The patient will return to radiation oncology clinic for routine followup in one month. I advised them to call or return sooner if they have any questions or concerns related to their recovery or treatment.  -----------------------------------  Billie Lade, PhD, MD This document serves as a record of services personally performed by Antony Blackbird, MD. It was created on his behalf by Herbie Saxon, a trained medical scribe. The creation of this record is based on the scribe's personal observations and the provider's statements to them. This document has been checked and approved by the attending provider.

## 2023-03-26 NOTE — Progress Notes (Signed)
 Radiation Oncology         [phone] ________________________________  Name: Jeffery Fernandez MRN: [id number]  Date: 03/27/2023  DOB: 11-Sep-1945  Follow-Up Visit Note  CC: [name], MD  Leslye Peer, MD  No diagnosis found.  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      Indication for treatment:  palliative        Interval Since Last Radiation:  1 month 15 days  Radiation treatment dates:      First Treatment Date: 2023-01-27   Last Treatment Date: 2023-02-10  Site/Dose/Technique/Mode:  Plan Name: Spine_T Site: Thoracic Spine Technique: 3D Mode: Photon Dose Per Fraction: 3 Gy Prescribed Dose (Delivered / Prescribed): 30 Gy / 30 Gy Prescribed Fxs (Delivered / Prescribed): 10 / 10  Narrative:  The patient returns today for routine follow-up. Patient was last seen in office on 01-09-23 for his initial consult. Since then, patient completed his radiation treatment which he tolerated quite well with no complains of fatigue, skin changes, or pain.   Since his consultation date, he underwent genetic testing which did not indicate any actionable mutations that can the underlying cause of his carcinoma.                                 He presented for palliative care consult with NP on 02-05-23 where they discussed the role of palliative care and goal of improving quality of life of both patients and their families. He started adjuvant chemotherapy on 02-18-23 receiving immunotherapy with ipilimumab and nivolumab, and chemotherapy with carboplatin and Paclitaxel. Since then, he has experienced significant nausea and vomiting, which have persisted despite the use of antiemetics such as Zofran and Compazine. Patient underwent a DG chest on 02-20-23 showed masslike opacity in the right upper lung extending to the hilum consistent with known diagnosis and an enlarged heart.    Most recent follow up with Dr. Shirline Frees on 03-11-23, he reported feeling well  overall, did endorse occasional leg pain and episode of discomfort which he attributes Neulasta injection, which was administered to boost his white blood cell count and caused more trouble than the chemotherapy itself.    No other significant oncologic interval history since the patient was last seen.   Of note: he presented to the ED on 02-20-23 for a fever of 100.9 degrees. Underwent a port-a catheterization on 02-19-23   Allergies:  is allergic to amlodipine, penicillins, and hydralazine.  Meds: Current Outpatient Medications  Medication Sig Dispense Refill   acetaminophen (TYLENOL) 500 MG tablet Take 1,000 mg by mouth every 6 (six) hours as needed (for pain.).     aspirin EC 81 MG tablet Take 81 mg by mouth daily. Swallow whole.     atorvastatin (LIPITOR) 40 MG tablet Take 40 mg by mouth at bedtime.     buPROPion (WELLBUTRIN XL) 150 MG 24 hr tablet Take 150 mg by mouth every morning.     busPIRone (BUSPAR) 10 MG tablet Take 10 mg by mouth in the morning.     Dulaglutide (TRULICITY) 1.5 MG/0.5ML SOAJ Inject 1.5 mg into the skin daily. Takes on Thursdays     famotidine (PEPCID) 20 MG tablet Take 1 tablet (20 mg total) by mouth 2 (two) times daily. 60 tablet 3   gabapentin (NEURONTIN) 100 MG capsule Take 1 capsule (100 mg total) by mouth daily.     HYDROcodone-acetaminophen (NORCO/VICODIN)  5-325 MG tablet Take 1 tablet by mouth every 6 (six) hours as needed for moderate pain (pain score 4-6) or severe pain (pain score 7-10). 60 tablet 0   insulin glargine (LANTUS) 100 UNIT/ML injection Inject 40 Units into the skin in the morning.     lidocaine-prilocaine (EMLA) cream Apply to affected area once 30 g 3   LORazepam (ATIVAN) 0.5 MG tablet Take 1 tablet (0.5 mg total) by mouth every 8 (eight) hours as needed for anxiety. 0.5 mg p.o. every 8 hours as needed for anxiety/nausea. 30 tablet 0   ondansetron (ZOFRAN) 4 MG tablet Take 1 tablet (4 mg total) by mouth every 8 (eight) hours as needed for  nausea or vomiting. 30 tablet 2   prochlorperazine (COMPAZINE) 10 MG tablet Take 1 tablet (10 mg total) by mouth every 6 (six) hours as needed for nausea or vomiting. 30 tablet 1   sertraline (ZOLOFT) 100 MG tablet Take 100 mg by mouth in the morning.     torsemide (DEMADEX) 20 MG tablet Take 2 tablets (40 mg total) by mouth daily. (Patient taking differently: Take 40 mg by mouth in the morning.) 60 tablet 0   No current facility-administered medications for this encounter.    Physical Findings: The patient is in no acute distress. Patient is alert and oriented.  vitals were not taken for this visit. .  No significant changes. Lungs are clear to auscultation bilaterally. Heart has regular rate and rhythm. No palpable cervical, supraclavicular, or axillary adenopathy. Abdomen soft, non-tender, normal bowel sounds.   Lab Findings: Lab Results  Component Value Date   WBC 9.8 03/25/2023   HGB 9.1 (L) 03/25/2023   HCT 27.8 (L) 03/25/2023   MCV 94.2 03/25/2023   PLT 228 03/25/2023    Radiographic Findings: No results found.  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      The patient is recovering from the effects of radiation.  ***  Plan:  ***   *** minutes of total time was spent for this patient encounter, including preparation, face-to-face counseling with the patient and coordination of care, physical exam, and documentation of the encounter. ____________________________________  Billie Lade, PhD, MD  This document serves as a record of services personally performed by Antony Blackbird, MD. It was created on his behalf by Herbie Saxon, a trained medical scribe. The creation of this record is based on the scribe's personal observations and the provider's statements to them. This document has been checked and approved by the attending provider.

## 2023-03-27 ENCOUNTER — Ambulatory Visit
Admission: RE | Admit: 2023-03-27 | Discharge: 2023-03-27 | Disposition: A | Discharge status: Home / Self Care | Payer: HMO | Source: Ambulatory Visit | Attending: Radiation Oncology | Admitting: Radiation Oncology

## 2023-03-27 ENCOUNTER — Encounter: Payer: Self-pay | Admitting: Radiation Oncology

## 2023-03-27 ENCOUNTER — Ambulatory Visit (HOSPITAL_COMMUNITY)
Admission: RE | Admit: 2023-03-27 | Discharge: 2023-03-27 | Disposition: A | Discharge status: Home / Self Care | Payer: HMO | Source: Ambulatory Visit | Attending: Radiation Oncology | Admitting: Radiation Oncology

## 2023-03-27 VITALS — BP 113/73 | HR 104 | Temp 97.4°F | Resp 20 | Ht 69.0 in | Wt 170.0 lb

## 2023-03-27 DIAGNOSIS — C7951 Secondary malignant neoplasm of bone: Secondary | ICD-10-CM | POA: Insufficient documentation

## 2023-03-27 DIAGNOSIS — Z923 Personal history of irradiation: Secondary | ICD-10-CM | POA: Insufficient documentation

## 2023-03-27 DIAGNOSIS — C3411 Malignant neoplasm of upper lobe, right bronchus or lung: Secondary | ICD-10-CM | POA: Diagnosis present

## 2023-03-27 HISTORY — DX: Personal history of irradiation: Z92.3

## 2023-03-27 NOTE — Progress Notes (Signed)
 [name] is here today for follow up post radiation to the T-Spine  They completed their radiation on: 02/10/23   Does the patient complain of any of the following:  Pain: patient denies pain to treatment field. Reports pain to bilateral lower extremities.  Diarrhea/Constipation: Constipation  Nausea/Vomiting: Yes, nausea and vomiting Appetite- Improving Swallowing: No Post radiation skin changes: No   Additional comments if applicable:    BP 113/73 (BP Location: Left Arm, Patient Position: Sitting, Cuff Size: Normal)   Pulse (!) 104   Temp (!) 97.4 F (36.3 C)   Resp 20   Ht 5\' 9"  (1.753 m)   Wt 170 lb (77.1 kg)   SpO2 97%   BMI 25.10 kg/m

## 2023-03-28 ENCOUNTER — Telehealth: Payer: Self-pay | Admitting: Radiology

## 2023-03-28 DIAGNOSIS — C7951 Secondary malignant neoplasm of bone: Secondary | ICD-10-CM

## 2023-03-28 NOTE — Telephone Encounter (Signed)
 I called the patient to review the results of his femur X-Ray. Imaging demonstrates a lytic cortical lesion in the midshaft right femur, likely to be a metastatic lesion. Patient may be a candidate for palliative radiation and/or surgical stabilization to this area. Radiology recommended MRI of the area to better visualized the lesion. I shared these results and recommendations with the patient and his daughter. They would like to proceed with further imaging. Order for MRI of the right femur placed today. I will call the patient with the results and the plan moving forward at that time.     Bryan Lemma, PA-C

## 2023-03-31 NOTE — Progress Notes (Unsigned)
 Palliative Medicine [facility]  [phone] [phone]) [phone]   Name: Jeffery Fernandez Date: 03/31/2023 MRN: [id number]  DOB: Feb 12, 1945  Patient Care Team: [name], MD as PCP - General (Family Medicine) Chilton Si, MD as PCP - Cardiology (Cardiology) Pickenpack-Cousar, Arty Baumgartner, NP as Nurse Practitioner (Hospice and Palliative Medicine)    INTERVAL HISTORY: Jeffery Fernandez is a 78 y.o. 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 As well as COPD, DM type 2, arthritis, hypertension, and hyperlipidemia. Palliative ask to see for symptom management and goals of care.   SOCIAL HISTORY:     reports that he quit smoking about 37 years ago. His smoking use included cigarettes. He started smoking about 62 years ago. He has a 37.5 pack-year smoking history. He has never used smokeless tobacco. He reports that he does not currently use alcohol. He reports that he does not use drugs.  ADVANCE DIRECTIVES:  Advanced directives on file name Jeffery Fernandez as the primary and Jeffery Fernandez as secondary decision makers should the patient become unable to make his own decisions.  CODE STATUS: Full code  PAST MEDICAL HISTORY: Past Medical History:  Diagnosis Date   Arrhythmia    Arthritis    Cataract    bilateral cataract extraction with intraoccular lens implants   Chest pain, unspecified    Chronic airway obstruction, not elsewhere classified    PT. DENIES HE HAS COPD   CKD (chronic kidney disease)    Colon polyps    Adenomatous Polyps 2007   Depression    Diabetes mellitus    Diverticulosis    Gout    Heart murmur    History of kidney stones    History of radiation therapy    Thoracic Spine- 01/27/23-02/10/23- Dr. Antony Blackbird   Hyperlipemia    Hypertension    Nephrolithiasis    Normocytic anemia 04/23/2017   Obstructive sleep apnea (adult) (pediatric)    Other and unspecified hyperlipidemia     Other left bundle branch block    Retinal vascular occlusion, unspecified    right   Shortness of breath    on exertion   Unspecified sleep apnea    Vision loss of right eye    White coat syndrome with diagnosis of hypertension 09/19/2017    ALLERGIES:  is allergic to amlodipine, penicillins, and hydralazine.  MEDICATIONS:  Current Outpatient Medications  Medication Sig Dispense Refill   acetaminophen (TYLENOL) 500 MG tablet Take 1,000 mg by mouth every 6 (six) hours as needed (for pain.).     aspirin EC 81 MG tablet Take 81 mg by mouth daily. Swallow whole.     atorvastatin (LIPITOR) 40 MG tablet Take 40 mg by mouth at bedtime.     buPROPion (WELLBUTRIN XL) 150 MG 24 hr tablet Take 150 mg by mouth every morning.     busPIRone (BUSPAR) 10 MG tablet Take 10 mg by mouth in the morning.     Dulaglutide (TRULICITY) 1.5 MG/0.5ML SOAJ Inject 1.5 mg into the skin daily. Takes on Thursdays     famotidine (PEPCID) 20 MG tablet Take 1 tablet (20 mg total) by mouth 2 (two) times daily. 60 tablet 3   gabapentin (NEURONTIN) 100 MG capsule Take 1 capsule (100 mg total) by mouth daily.     HYDROcodone-acetaminophen (NORCO/VICODIN) 5-325 MG tablet Take 1 tablet by mouth every 6 (six) hours as needed for moderate pain (pain score 4-6) or severe pain (pain  score 7-10). 60 tablet 0   insulin glargine (LANTUS) 100 UNIT/ML injection Inject 40 Units into the skin in the morning.     lidocaine-prilocaine (EMLA) cream Apply to affected area once 30 g 3   LORazepam (ATIVAN) 0.5 MG tablet Take 1 tablet (0.5 mg total) by mouth every 8 (eight) hours as needed for anxiety. 0.5 mg p.o. every 8 hours as needed for anxiety/nausea. 30 tablet 0   ondansetron (ZOFRAN) 4 MG tablet Take 1 tablet (4 mg total) by mouth every 8 (eight) hours as needed for nausea or vomiting. 30 tablet 2   prochlorperazine (COMPAZINE) 10 MG tablet Take 1 tablet (10 mg total) by mouth every 6 (six) hours as needed for nausea or vomiting. 30  tablet 1   sertraline (ZOLOFT) 100 MG tablet Take 100 mg by mouth in the morning.     torsemide (DEMADEX) 20 MG tablet Take 2 tablets (40 mg total) by mouth daily. (Patient not taking: Reported on 03/27/2023) 60 tablet 0   No current facility-administered medications for this visit.    VITAL SIGNS: There were no vitals taken for this visit. There were no vitals filed for this visit.  Estimated body mass index is 25.1 kg/m as calculated from the following:   Height as of 03/27/23: 5\' 9"  (1.753 m).   Weight as of 03/27/23: 170 lb (77.1 kg).   PERFORMANCE STATUS (ECOG) : 1 - Symptomatic but completely ambulatory   Physical Exam General: Weak appearing, uncomfortable in wheelchair  Cardiovascular: regular rate and rhythm Pulmonary: normal breathing pattern  Extremities: no edema, no joint deformities Skin: no rashes Neurological: AAOx3  Discussed the use of AI scribe software for clinical note transcription with the patient, who gave verbal consent to proceed.  IMPRESSION:  Jeffery Fernandez is a 78 year old male with metastatic disease now involving his femur who presents with increase leg pain. His daughter is present. No issues with constipation or diarrhea, but he is experiencing ongoing nausea, particularly upon waking. An anti-nausea medication taken at the start of the day helps manage this symptom. His appetite has improved however daughter expresses concerns that he continues to not show any increase in his weight. Encouraged patient to continue with good appetite and fluid intake.   Jeffery Fernandez states he is experiencing significant leg pain due to recent findings of metastasis in the femur. The pain is severe and persistent, and it is not adequately managed with his current medication regimen. He is currently taking hydrocodone, one tablet twice a day, but finds that it only provides minimal relief for a short duration. Gabapentin is taken once daily, which is the maximum he can  tolerate. Tylenol has been ineffective in managing his pain. Education provided on the use of hydrocodone and frequency. Advised patient to increase frequency of use to every 4-6 hours as needed for pain. We discussed not allowing the pain to get out of control which may mean he is taking more often. Education provided on the role of radiation with hopes of improvement in pain in the upcoming days/weeks. He is aware we can titrate usage down once pain improves with radiation.   All questions answered and support provided.  I discussed the importance of continued conversation with family and their medical providers regarding overall plan of care and treatment options, ensuring decisions are within the context of the patients values and GOCs.  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 Hydrocodone 1 tablet  twice daily provides minimal relief. Patient is also on Gabapentin 1 tablet daily. -Increase Hydrocodone to 1.5 tablets every 4-6 hours as needed for pain. -If 1.5 tablets are ineffective, patient may increase to 2 tablets. -Continue Gabapentin 1 tablet daily. -Administer IV pain medication during infusion today. Morphine 2mg  x1 dose.  -Plan for radiation therapy to femur, which should help decrease pain per Oncology. -We will continue to closely monitor pain.   Nausea Patient experiences morning nausea, managed with daily anti-nausea medication. -Continue current anti-nausea regimen.  Follow-up in 3 weeks unless patient's pain becomes unmanageable or other issues arise. Monitor for constipation due to increased Hydrocodone use. If constipation occurs, consider increasing stool softeners to twice daily (2 tablets).  Patient expressed understanding and was in agreement with this plan. He also understands that He can call the clinic at any time with any questions, concerns, or complaints.   Any controlled substances utilized were prescribed in the  context of palliative care. PDMP has been reviewed.   Visit consisted of counseling and education dealing with the complex and emotionally intense issues of symptom management and palliative care in the setting of serious and potentially life-threatening illness.  Willette Alma, AGPCNP-BC  Palliative Medicine Team/[facility]

## 2023-04-01 ENCOUNTER — Encounter: Payer: Self-pay | Admitting: Nurse Practitioner

## 2023-04-01 ENCOUNTER — Inpatient Hospital Stay: Payer: HMO | Attending: Nurse Practitioner

## 2023-04-01 ENCOUNTER — Inpatient Hospital Stay (HOSPITAL_BASED_OUTPATIENT_CLINIC_OR_DEPARTMENT_OTHER): Payer: HMO | Admitting: Nurse Practitioner

## 2023-04-01 ENCOUNTER — Inpatient Hospital Stay: Payer: HMO

## 2023-04-01 ENCOUNTER — Inpatient Hospital Stay (HOSPITAL_BASED_OUTPATIENT_CLINIC_OR_DEPARTMENT_OTHER): Payer: HMO | Admitting: Internal Medicine

## 2023-04-01 VITALS — BP 113/76 | HR 99 | Temp 97.8°F | Resp 16 | Ht 69.0 in | Wt 168.0 lb

## 2023-04-01 DIAGNOSIS — F32A Depression, unspecified: Secondary | ICD-10-CM | POA: Insufficient documentation

## 2023-04-01 DIAGNOSIS — G893 Neoplasm related pain (acute) (chronic): Secondary | ICD-10-CM

## 2023-04-01 DIAGNOSIS — Z7982 Long term (current) use of aspirin: Secondary | ICD-10-CM | POA: Insufficient documentation

## 2023-04-01 DIAGNOSIS — Z5111 Encounter for antineoplastic chemotherapy: Secondary | ICD-10-CM | POA: Insufficient documentation

## 2023-04-01 DIAGNOSIS — C3411 Malignant neoplasm of upper lobe, right bronchus or lung: Secondary | ICD-10-CM

## 2023-04-01 DIAGNOSIS — J449 Chronic obstructive pulmonary disease, unspecified: Secondary | ICD-10-CM | POA: Diagnosis not present

## 2023-04-01 DIAGNOSIS — R530 Neoplastic (malignant) related fatigue: Secondary | ICD-10-CM | POA: Diagnosis not present

## 2023-04-01 DIAGNOSIS — Z515 Encounter for palliative care: Secondary | ICD-10-CM

## 2023-04-01 DIAGNOSIS — Z5112 Encounter for antineoplastic immunotherapy: Secondary | ICD-10-CM | POA: Insufficient documentation

## 2023-04-01 DIAGNOSIS — I129 Hypertensive chronic kidney disease with stage 1 through stage 4 chronic kidney disease, or unspecified chronic kidney disease: Secondary | ICD-10-CM | POA: Insufficient documentation

## 2023-04-01 DIAGNOSIS — E785 Hyperlipidemia, unspecified: Secondary | ICD-10-CM | POA: Diagnosis not present

## 2023-04-01 DIAGNOSIS — C7951 Secondary malignant neoplasm of bone: Secondary | ICD-10-CM | POA: Diagnosis not present

## 2023-04-01 DIAGNOSIS — G4733 Obstructive sleep apnea (adult) (pediatric): Secondary | ICD-10-CM | POA: Diagnosis not present

## 2023-04-01 DIAGNOSIS — Z95828 Presence of other vascular implants and grafts: Secondary | ICD-10-CM

## 2023-04-01 DIAGNOSIS — Z79899 Other long term (current) drug therapy: Secondary | ICD-10-CM | POA: Diagnosis not present

## 2023-04-01 DIAGNOSIS — Z794 Long term (current) use of insulin: Secondary | ICD-10-CM | POA: Insufficient documentation

## 2023-04-01 DIAGNOSIS — D539 Nutritional anemia, unspecified: Secondary | ICD-10-CM

## 2023-04-01 DIAGNOSIS — E1122 Type 2 diabetes mellitus with diabetic chronic kidney disease: Secondary | ICD-10-CM | POA: Diagnosis not present

## 2023-04-01 LAB — FOLATE: Folate: 8.4 ng/mL (ref >5.9)

## 2023-04-01 LAB — CBC WITH DIFFERENTIAL (CANCER CENTER ONLY)
Abs Immature Granulocytes: 0.04 10*3/uL (ref 0.00–0.07)
Basophils Absolute: 0.1 10*3/uL (ref 0.0–0.1)
Basophils Relative: 1 %
Eosinophils Absolute: 0 10*3/uL (ref 0.0–0.5)
Eosinophils Relative: 0 %
HCT: 30.1 % — ABNORMAL LOW (ref 39.0–52.0)
Hemoglobin: 9.7 g/dL — ABNORMAL LOW (ref 13.0–17.0)
Immature Granulocytes: 0 %
Lymphocytes Relative: 21 %
Lymphs Abs: 2.2 10*3/uL (ref 0.7–4.0)
MCH: 30.7 pg (ref 26.0–34.0)
MCHC: 32.2 g/dL (ref 30.0–36.0)
MCV: 95.3 fL (ref 80.0–100.0)
Monocytes Absolute: 0.6 10*3/uL (ref 0.1–1.0)
Monocytes Relative: 6 %
Neutro Abs: 7.8 10*3/uL — ABNORMAL HIGH (ref 1.7–7.7)
Neutrophils Relative %: 72 %
Platelet Count: 242 10*3/uL (ref 150–400)
RBC: 3.16 MIL/uL — ABNORMAL LOW (ref 4.22–5.81)
RDW: 18 % — ABNORMAL HIGH (ref 11.5–15.5)
WBC Count: 10.7 10*3/uL — ABNORMAL HIGH (ref 4.0–10.5)
nRBC: 0 % (ref 0.0–0.2)

## 2023-04-01 LAB — TSH: TSH: 1.678 u[IU]/mL (ref 0.350–4.500)

## 2023-04-01 LAB — CMP (CANCER CENTER ONLY)
ALT: 9 U/L (ref 0–44)
AST: 13 U/L — ABNORMAL LOW (ref 15–41)
Albumin: 3.1 g/dL — ABNORMAL LOW (ref 3.5–5.0)
Alkaline Phosphatase: 129 U/L — ABNORMAL HIGH (ref 38–126)
Anion gap: 9 (ref 5–15)
BUN: 29 mg/dL — ABNORMAL HIGH (ref 8–23)
CO2: 28 mmol/L (ref 22–32)
Calcium: 8.6 mg/dL — ABNORMAL LOW (ref 8.9–10.3)
Chloride: 100 mmol/L (ref 98–111)
Creatinine: 3.02 mg/dL — ABNORMAL HIGH (ref 0.61–1.24)
GFR, Estimated: 20 mL/min — ABNORMAL LOW (ref >60)
Glucose, Bld: 180 mg/dL — ABNORMAL HIGH (ref 70–99)
Potassium: 3.8 mmol/L (ref 3.5–5.1)
Sodium: 137 mmol/L (ref 135–145)
Total Bilirubin: 0.5 mg/dL (ref 0.0–1.2)
Total Protein: 7.4 g/dL (ref 6.5–8.1)

## 2023-04-01 LAB — IRON AND IRON BINDING CAPACITY (CC-WL,HP ONLY)
Iron: 40 ug/dL — ABNORMAL LOW (ref 45–182)
Saturation Ratios: 19 % (ref 17.9–39.5)
TIBC: 211 ug/dL — ABNORMAL LOW (ref 250–450)
UIBC: 171 ug/dL (ref 117–376)

## 2023-04-01 LAB — VITAMIN B12: Vitamin B-12: 619 pg/mL (ref 180–914)

## 2023-04-01 LAB — FERRITIN: Ferritin: 331 ng/mL (ref 24–336)

## 2023-04-01 MED ORDER — SODIUM CHLORIDE 0.9 % IV SOLN: INTRAVENOUS | Status: DC

## 2023-04-01 MED ORDER — FAMOTIDINE IN NACL 20-0.9 MG/50ML-% IV SOLN
20.0000 mg | Freq: Once | INTRAVENOUS | Status: AC
  Administered 2023-04-01: 20 mg via INTRAVENOUS
  Filled 2023-04-01: qty 50

## 2023-04-01 MED ORDER — SODIUM CHLORIDE 0.9 % IV SOLN
1.0000 mg/kg | Freq: Once | INTRAVENOUS | Status: AC
  Administered 2023-04-01: 85 mg via INTRAVENOUS
  Filled 2023-04-01: qty 17

## 2023-04-01 MED ORDER — DIPHENHYDRAMINE HCL 50 MG/ML IJ SOLN
25.0000 mg | Freq: Once | INTRAMUSCULAR | Status: AC
Start: 2023-04-01 — End: 2023-04-01
  Administered 2023-04-01: 25 mg via INTRAVENOUS
  Filled 2023-04-01: qty 1

## 2023-04-01 MED ORDER — HEPARIN SOD (PORK) LOCK FLUSH 100 UNIT/ML IV SOLN
500.0000 [IU] | Freq: Once | INTRAVENOUS | Status: AC | PRN
Start: 2023-04-01 — End: 2023-04-01
  Administered 2023-04-01: 500 [IU]

## 2023-04-01 MED ORDER — SODIUM CHLORIDE 0.9 % IV SOLN
360.0000 mg | Freq: Once | INTRAVENOUS | Status: AC
  Administered 2023-04-01: 360 mg via INTRAVENOUS
  Filled 2023-04-01: qty 12

## 2023-04-01 MED ORDER — SODIUM CHLORIDE 0.9% FLUSH
10.0000 mL | Freq: Once | INTRAVENOUS | Status: AC
  Administered 2023-04-01: 10 mL

## 2023-04-01 MED ORDER — SODIUM CHLORIDE 0.9% FLUSH
10.0000 mL | INTRAVENOUS | Status: DC | PRN
Start: 2023-04-01 — End: 2023-04-01
  Administered 2023-04-01: 10 mL

## 2023-04-01 MED ORDER — MORPHINE SULFATE (PF) 2 MG/ML IV SOLN
2.0000 mg | Freq: Once | INTRAVENOUS | Status: AC
  Administered 2023-04-01: 2 mg via INTRAVENOUS
  Filled 2023-04-01: qty 1

## 2023-04-01 NOTE — Progress Notes (Signed)
 [facility] Telephone:([phone]   [phone]  OFFICE PROGRESS NOTE  [name], MD [address] [place] [address]  DIAGNOSIS: Stage IV (T2b, N3, M1b) non-small cell lung cancer, adenocarcinoma . This was diagnosed in December 2024 and presented with large right apical lung mass in addition to right hilar, mediastinal and supraclavicular lymphadenopathy as well as pulmonary nodules in the right upper lobe and right lower lobe with T10 bone metastasis in addition to medial left inguinal lymph node versus subcutaneous nodule.   Biomarker Findings HRD signature - HRDsig Negative Microsatellite status - Cannot Be Determined ? Tumor Mutational Burden - 8 Muts/Mb Genomic Findings For a complete list of the genes assayed, please refer to the Appendix. CDKN2A loss KEAP1 K30fs*60 STK11 E291* CDKN2B loss IKZF1 R111* TP53 R280K 8 Disease relevant genes with no reportable alterations: ALK, BRAF, EGFR, ERBB2, KRAS, MET, RET, ROS1  PRIOR THERAPY: Palliative radiotherapy to the T10 bone metastasis.  CURRENT THERAPY: First-line treatment with chemoimmunotherapy in the form of carboplatin for AUC of 5 and paclitaxel 175 Mg/M2 on days 1 and 22 in addition to ipilimumab 1 Mg/KG on day 1 and nivolumab 360 Mg IV on days 1 and 22 for 1 cycle followed by maintenance treatment with ipilimumab 1 Mg/KG every 6 weeks and nivolumab 360 Mg IV every 3 weeks.  First dose February 18, 2023.  Chemotherapy was discontinued starting from day 22 of cycle #1 secondary to intolerance.  INTERVAL HISTORY: Jeffery Fernandez 78 y.o. male returns to the clinic today for follow-up visit accompanied by his daughter.Discussed the use of AI scribe software for clinical note transcription with the patient, who gave verbal consent to proceed.  History of Present Illness   Jeffery Fernandez is a 78 year old male with stage four adenocarcinoma who presents for follow-up of his cancer  treatment. He is accompanied by Jeffery Fernandez, his daughter.  He was diagnosed with stage four adenocarcinoma in December 2024. Initially, he received palliative radiation to a T10 bone lesion, followed by chemotherapy and immunotherapy, including carboplatin, paclitaxel, ipilimumab, and nivolumab. Due to significant adverse effects, chemotherapy was discontinued, and he continued with immunotherapy alone, which he tolerated well. He is scheduled for lab work every three weeks in conjunction with his treatment.  He recently underwent an x-ray of his leg, which revealed a focal lytic cortical lesion in the midshaft right femur, new since previous studies, and an MRI was suggested, though results are pending. He experiences leg pain, which was previously mentioned to Dr. Roselind Messier, who did not attribute it to radiation. The leg pain is currently his main concern.  He experiences daily nausea, for which he takes medication inconsistently. On good days, he hydrates more, which may contribute to feeling better. No vomiting.  His appetite has improved, and he is eating better, but his weight has not increased, and he has lost a few more pounds since his last visit.  His hemoglobin level was noted to be 9.7 today, which is a slight improvement from previous levels.         MEDICAL HISTORY: Past Medical History:  Diagnosis Date   Arrhythmia    Arthritis    Cataract    bilateral cataract extraction with intraoccular lens implants   Chest pain, unspecified    Chronic airway obstruction, not elsewhere classified    PT. DENIES HE HAS COPD   CKD (chronic kidney disease)    Colon polyps  Adenomatous Polyps 2007   Depression    Diabetes mellitus    Diverticulosis    Gout    Heart murmur    History of kidney stones    History of radiation therapy    Thoracic Spine- 01/27/23-02/10/23- Dr. Antony Blackbird   Hyperlipemia    Hypertension    Nephrolithiasis    Normocytic anemia 04/23/2017   Obstructive  sleep apnea (adult) (pediatric)    Other and unspecified hyperlipidemia    Other left bundle branch block    Retinal vascular occlusion, unspecified    right   Shortness of breath    on exertion   Unspecified sleep apnea    Vision loss of right eye    White coat syndrome with diagnosis of hypertension 09/19/2017    ALLERGIES:  is allergic to amlodipine, penicillins, and hydralazine.  MEDICATIONS:  Current Outpatient Medications  Medication Sig Dispense Refill   acetaminophen (TYLENOL) 500 MG tablet Take 1,000 mg by mouth every 6 (six) hours as needed (for pain.).     aspirin EC 81 MG tablet Take 81 mg by mouth daily. Swallow whole.     atorvastatin (LIPITOR) 40 MG tablet Take 40 mg by mouth at bedtime.     buPROPion (WELLBUTRIN XL) 150 MG 24 hr tablet Take 150 mg by mouth every morning.     busPIRone (BUSPAR) 10 MG tablet Take 10 mg by mouth in the morning.     Dulaglutide (TRULICITY) 1.5 MG/0.5ML SOAJ Inject 1.5 mg into the skin daily. Takes on Thursdays     famotidine (PEPCID) 20 MG tablet Take 1 tablet (20 mg total) by mouth 2 (two) times daily. 60 tablet 3   gabapentin (NEURONTIN) 100 MG capsule Take 1 capsule (100 mg total) by mouth daily.     HYDROcodone-acetaminophen (NORCO/VICODIN) 5-325 MG tablet Take 1 tablet by mouth every 6 (six) hours as needed for moderate pain (pain score 4-6) or severe pain (pain score 7-10). 60 tablet 0   insulin glargine (LANTUS) 100 UNIT/ML injection Inject 40 Units into the skin in the morning.     lidocaine-prilocaine (EMLA) cream Apply to affected area once 30 g 3   LORazepam (ATIVAN) 0.5 MG tablet Take 1 tablet (0.5 mg total) by mouth every 8 (eight) hours as needed for anxiety. 0.5 mg p.o. every 8 hours as needed for anxiety/nausea. 30 tablet 0   ondansetron (ZOFRAN) 4 MG tablet Take 1 tablet (4 mg total) by mouth every 8 (eight) hours as needed for nausea or vomiting. 30 tablet 2   prochlorperazine (COMPAZINE) 10 MG tablet Take 1 tablet (10 mg  total) by mouth every 6 (six) hours as needed for nausea or vomiting. 30 tablet 1   sertraline (ZOLOFT) 100 MG tablet Take 100 mg by mouth in the morning.     torsemide (DEMADEX) 20 MG tablet Take 2 tablets (40 mg total) by mouth daily. (Patient not taking: Reported on 03/27/2023) 60 tablet 0   No current facility-administered medications for this visit.    SURGICAL HISTORY:  Past Surgical History:  Procedure Laterality Date   ANTERIOR CERVICAL DECOMP/DISCECTOMY FUSION N/A 06/26/2016   Procedure: Re-Exploration of Cervical Wound;  Surgeon: Donalee Citrin, MD;  Location: [facility] OR;  Service: Neurosurgery;  Laterality: N/A;   ANTERIOR CERVICAL DECOMP/DISCECTOMY FUSION N/A 06/26/2016   Procedure: ANTERIOR CERVICAL DECOMPRESSION/DISCECTOMY FUSION, INTERBODY PROSTESIS, PLATE, CERVICAL THREE CERVICAL FOUR, CERVICAL FOUR CERVICAL FIVE CERVICAL SIX;  Surgeon: Tressie Stalker, MD;  Location: [facility] OR;  Service: Neurosurgery;  Laterality: N/A;  APPLICATION OF WOUND VAC  06/05/2020   Procedure: APPLICATION OF WOUND VAC;  Surgeon: Tressie Stalker, MD;  Location: [facility] OR;  Service: Neurosurgery;;   BACK SURGERY     CATARACT EXTRACTION Bilateral    IR IMAGING GUIDED PORT INSERTION  02/19/2023   KIDNEY STONE SURGERY     KYPHOPLASTY N/A 01/19/2018   Procedure: KYPHOPLASTY LUMBAR THREE;  Surgeon: Tressie Stalker, MD;  Location: [facility] OR;  Service: Neurosurgery;  Laterality: N/A;  KYPHOPLASTY LUMBAR THREE   LUMBAR WOUND DEBRIDEMENT N/A 06/05/2020   Procedure: LUMBAR WOUND DEBRIDEMENT;  Surgeon: Tressie Stalker, MD;  Location: [facility] OR;  Service: Neurosurgery;  Laterality: N/A;    REVIEW OF SYSTEMS:  Constitutional: positive for fatigue and weight loss Eyes: negative Ears, nose, mouth, throat, and face: negative Respiratory: negative Cardiovascular: negative Gastrointestinal: positive for nausea Genitourinary:negative Integument/breast: negative Hematologic/lymphatic: negative Musculoskeletal:positive for arthralgias and  bone pain Neurological: negative Behavioral/Psych: negative Endocrine: negative Allergic/Immunologic: negative   PHYSICAL EXAMINATION: General appearance: alert, cooperative, fatigued, and no distress Head: Normocephalic, without obvious abnormality, atraumatic Neck: no adenopathy, no JVD, supple, symmetrical, trachea midline, and thyroid not enlarged, symmetric, no tenderness/mass/nodules Lymph nodes: Cervical, supraclavicular, and axillary nodes normal. Resp: clear to auscultation bilaterally Back: symmetric, no curvature. ROM normal. No CVA tenderness. Cardio: regular rate and rhythm, S1, S2 normal, no murmur, click, rub or gallop GI: soft, non-tender; bowel sounds normal; no masses,  no organomegaly Extremities: extremities normal, atraumatic, no cyanosis or edema Neurologic: Alert and oriented X 3, normal strength and tone. Normal symmetric reflexes. Normal coordination and gait  ECOG PERFORMANCE STATUS: 1 - Symptomatic but completely ambulatory  Blood pressure 113/76, pulse 99, temperature 97.8 F (36.6 C), temperature source Temporal, resp. rate 16, height 5\' 9"  (1.753 m), weight 168 lb (76.2 kg), SpO2 96%.  LABORATORY DATA: Lab Results  Component Value Date   WBC 10.7 (H) 04/01/2023   HGB 9.7 (L) 04/01/2023   HCT 30.1 (L) 04/01/2023   MCV 95.3 04/01/2023   PLT 242 04/01/2023      Chemistry      Component Value Date/Time   NA 139 03/25/2023 1149   NA 142 07/03/2022 1356   K 3.8 03/25/2023 1149   CL 103 03/25/2023 1149   CO2 29 03/25/2023 1149   BUN 29 (H) 03/25/2023 1149   BUN 22 07/03/2022 1356   CREATININE 2.95 (H) 03/25/2023 1149      Component Value Date/Time   CALCIUM 8.4 (L) 03/25/2023 1149   ALKPHOS 126 03/25/2023 1149   AST 12 (L) 03/25/2023 1149   ALT 9 03/25/2023 1149   BILITOT 0.4 03/25/2023 1149       RADIOGRAPHIC STUDIES: DG FEMUR PORT, MIN 2 VIEWS RIGHT Result Date: 03/27/2023 CLINICAL DATA:  Bone pain. Rule out metastasis. Right upper leg  pain for 3 weeks. No injury. EXAM: RIGHT FEMUR PORTABLE 2 VIEW COMPARISON:  Right knee 12/31/2022.  Right hip 12/31/2022 FINDINGS: There is a focal area of cortical destruction along the lateral midshaft of the right femur. This was not demonstrated in the previous studies. The appearance is that of an aggressive lytic lesion with noncalcified margins. Appearances compatible with metastatic lesion or possibly myeloma. No pathologic fractures identified. Degenerative changes are demonstrated in the hip and knee. Vascular calcifications in the soft tissues. IMPRESSION: Focal lytic cortical lesion in the midshaft right femur, new since previous studies. Likely metastatic lesion. Electronically Signed   By: Burman Nieves M.D.   On: 03/27/2023 15:16    ASSESSMENT AND PLAN:  This is a very pleasant 78 years old white male with  Stage IV (T2b, N3, M1b) non-small cell lung cancer, adenocarcinoma . This was diagnosed in December 2024 and presented with large right apical lung mass in addition to right hilar, mediastinal and supraclavicular lymphadenopathy as well as pulmonary nodules in the right upper lobe and right lower lobe with T10 bone metastasis in addition to medial left inguinal lymph node versus subcutaneous nodule.  Molecular studies showed no actionable mutation and there was insufficient material for PD-L1 expression. The patient status post palliative radiotherapy to the T10 vertebral body lesion. The patient is currently undergoing first-line treatment with chemoimmunotherapy in the form of carboplatin for AUC of 5 and paclitaxel 175 Mg/M2 on days 1 and 22 in addition to ipilimumab 1 Mg/KG on day 1 and nivolumab 360 Mg IV on days 1 and 22 for 1 cycle followed by maintenance treatment with ipilimumab 1 Mg/KG every 6 weeks and nivolumab 360 Mg IV every 3 weeks.  First dose February 18, 2023.  He is status post 1 cycle.  He has a rough time with his treatment.  He had a rough time with the first cycle of  his treatment with chemotherapy which will be discontinued starting from day 22 of cycle #1. The patient will continue on immunotherapy only with ipilimumab 1 Mg/Kg every 6 weeks in addition to nivolumab 360 Mg IV every 3 weeks for up to 2 years as long as the patient does not have any disease progression or unacceptable toxicity. He tolerated the last dose of the immunotherapy fairly well. I recommended for him to proceed with day 1 of cycle #2 today as planned with ipilimumab and nivolumab. Assessment and Plan    Stage IV Adenocarcinoma Diagnosed in December 2024. Received palliative radiation to T10 bone lesion. Initially treated with carboplatin, paclitaxel, ipilimumab, and nivolumab, but chemotherapy was stopped due to severe side effects. Continued on immunotherapy alone, which was well-tolerated. Recent leg pain and a spot identified on x-ray, awaiting MRI results. Hemoglobin level is 9.7, slightly improved from previous. Weight has slightly decreased, but he is eating better. - Continue immunotherapy - Order MRI for leg lesion - Follow-up with Dr. Roselind Messier for radiation treatment of the right femoral lesion - Monitor hemoglobin levels - Schedule follow-up appointment in three weeks - Perform lab tests every three weeks before treatment  Nausea Experiencing daily nausea, partially managed with medication. Symptoms fluctuate with good and bad days. Encouraged hydration and proper nutrition to manage symptoms. - Continue current nausea medication - Encourage hydration and proper nutrition  General Health Maintenance Noted slight weight loss since last visit. Encouraged balanced diet and hydration. - Encourage balanced diet and hydration - Monitor weight and nutritional intake.   He was advised to call immediately if he has any concerning symptoms in the interval. The patient voices understanding of current disease status and treatment options and is in agreement with the current care  plan.  All questions were answered. The patient knows to call the clinic with any problems, questions or concerns. We can certainly see the patient much sooner if necessary.  The total time spent in the appointment was 30 minutes.  Disclaimer: This note was dictated with voice recognition software. Similar sounding words can inadvertently be transcribed and may not be corrected upon review.

## 2023-04-01 NOTE — Patient Instructions (Addendum)
 [facility] - A DEPT OF [facility]  Discharge Instructions: Thank you for choosing [facility] to provide your oncology and hematology care.   If you have a lab appointment with the Cancer Center, please go directly to the Cancer Center and check in at the registration area.   Wear comfortable clothing and clothing appropriate for easy access to any Portacath or PICC line.   We strive to give you quality time with your provider. You may need to reschedule your appointment if you arrive late (15 or more minutes).  Arriving late affects you and other patients whose appointments are after yours.  Also, if you miss three or more appointments without notifying the office, you may be dismissed from the clinic at the provider's discretion.      For prescription refill requests, have your pharmacy contact our office and allow 72 hours for refills to be completed.    Today you received the following chemotherapy and/or immunotherapy agents  NIVOLUMAB (OPDIVO) and  IPILIMUMAB (YERVOY)   To help prevent nausea and vomiting after your treatment, we encourage you to take your nausea medication as directed.  BELOW ARE SYMPTOMS THAT SHOULD BE REPORTED IMMEDIATELY: *FEVER GREATER THAN 100.4 F (38 C) OR HIGHER *CHILLS OR SWEATING *NAUSEA AND VOMITING THAT IS NOT CONTROLLED WITH YOUR NAUSEA MEDICATION *UNUSUAL SHORTNESS OF BREATH *UNUSUAL BRUISING OR BLEEDING *URINARY PROBLEMS (pain or burning when urinating, or frequent urination) *BOWEL PROBLEMS (unusual diarrhea, constipation, pain near the anus) TENDERNESS IN MOUTH AND THROAT WITH OR WITHOUT PRESENCE OF ULCERS (sore throat, sores in mouth, or a toothache) UNUSUAL RASH, SWELLING OR PAIN  UNUSUAL VAGINAL DISCHARGE OR ITCHING   Items with * indicate a potential emergency and should be followed up as soon as possible or go to the Emergency Department if any problems should occur.  Please show the  CHEMOTHERAPY ALERT CARD or IMMUNOTHERAPY ALERT CARD at check-in to the Emergency Department and triage nurse.  Should you have questions after your visit or need to cancel or reschedule your appointment, please contact [facility] - A DEPT OF [facility]  Dept: [phone]  and follow the prompts.  Office hours are 8:00 a.m. to 4:30 p.m. Monday - Friday. Please note that voicemails left after 4:00 p.m. may not be returned until the following business day.  We are closed weekends and major holidays. You have access to a nurse at all times for urgent questions. Please call the main number to the clinic Dept: [phone] and follow the prompts.   For any non-urgent questions, you may also contact your provider using MyChart. We now offer e-Visits for anyone 64 and older to request care online for non-urgent symptoms. For details visit [email or web address].   Also download the MyChart app! Go to the app store, search "MyChart", open the app, select [facility], and log in with your MyChart username and password.

## 2023-04-01 NOTE — Progress Notes (Signed)
 Ipilimumab (YERVOY) Patient Monitoring Assessment   Is the patient experiencing any of the following general symptoms?:  [ N]Difficulty performing normal activities [ Y]Feeling sluggish or cold all the time [N ]Unusual weight gain [N ]Constant or unusual headaches [ N]Feeling dizzy or faint [N ]Changes in eyesight (blurry vision, double vision, or other vision problems) [ N ]Changes in mood or behavior (ex: decreased sex drive, irritability, or forgetfulness) [ N]Starting new medications (ex: steroids, other medications that lower immune response) [ ] Patient is not experiencing any of the general symptoms above.   Gastrointestinal  Patient is having 1 bowel movements each day.  Is this different from baseline? [ ] Yes [x ]No Are your stools watery or do they have a foul smell? [ ] Yes [x ]No Have you seen blood in your stools? [ ] Yes [x ]No Are your stools dark, tarry, or sticky? [ ] Yes [ ] No Are you having pain or tenderness in your belly? [ ] Yes [x ]No  Skin Does your skin itch? [ ] Yes [x ]No Do you have a rash? [ ] Yes [ x]No Has your skin blistered and/or peeled? [ ] Yes [ x]No Do you have sores in your mouth? [ ] Yes [ x]No  Hepatic Has your urine been dark or tea colored? [ ] Yes [ x]No Have you noticed that your skin or the whites of your eyes are turning yellow? [ ] Yes [ x]No Are you bleeding or bruising more easily than normal? [ ] Yes [ x]No Are you nauseous and/or vomiting? [ ] Yes [x ]No Do you have pain on the right side of your stomach? [ ] Yes [x ]No  Neurologic  Are you having unusual weakness of legs, arms, or face? [ ] Yes [x ]No Are you having numbness or tingling in your hands or feet? [ ] Yes [x ]No  Tenna Child

## 2023-04-01 NOTE — Progress Notes (Signed)
 Per Dr Arbutus Ped, okay to proceed with treatment with creatine at 3.02

## 2023-04-02 ENCOUNTER — Encounter: Payer: Self-pay | Admitting: Internal Medicine

## 2023-04-02 ENCOUNTER — Ambulatory Visit (HOSPITAL_COMMUNITY)
Admission: RE | Admit: 2023-04-02 | Discharge: 2023-04-02 | Disposition: A | Discharge status: Home / Self Care | Source: Ambulatory Visit | Attending: Radiology | Admitting: Radiology

## 2023-04-02 DIAGNOSIS — C7951 Secondary malignant neoplasm of bone: Secondary | ICD-10-CM | POA: Insufficient documentation

## 2023-04-02 MED ORDER — GADOBUTROL 1 MMOL/ML IV SOLN
7.5000 mL | Freq: Once | INTRAVENOUS | Status: AC | PRN
  Administered 2023-04-02: 7.5 mL via INTRAVENOUS

## 2023-04-03 LAB — T4: T4, Total: 6 ug/dL (ref 4.5–12.0)

## 2023-04-04 ENCOUNTER — Other Ambulatory Visit: Payer: Self-pay | Admitting: Radiology

## 2023-04-04 DIAGNOSIS — C7951 Secondary malignant neoplasm of bone: Secondary | ICD-10-CM

## 2023-04-04 NOTE — Progress Notes (Signed)
 I called the patient's daughter to review most recent MRI results. Findings are worrisome for a metastatic lesion in the cortex of the mid diaphysis of the right femur. Mr. Jeffery Fernandez continues to experience a lot of pain from this lesion. Urgent referral placed to ortho surgery today for consideration of stabilization prior to palliative radiation to the right femur.     Bryan Lemma, PA-C

## 2023-04-08 ENCOUNTER — Other Ambulatory Visit (HOSPITAL_COMMUNITY): Payer: Self-pay

## 2023-04-08 ENCOUNTER — Inpatient Hospital Stay: Payer: HMO

## 2023-04-09 ENCOUNTER — Ambulatory Visit (HOSPITAL_BASED_OUTPATIENT_CLINIC_OR_DEPARTMENT_OTHER): Admitting: Orthopaedic Surgery

## 2023-04-11 ENCOUNTER — Ambulatory Visit (HOSPITAL_BASED_OUTPATIENT_CLINIC_OR_DEPARTMENT_OTHER): Payer: Self-pay | Admitting: Orthopaedic Surgery

## 2023-04-11 ENCOUNTER — Ambulatory Visit (HOSPITAL_BASED_OUTPATIENT_CLINIC_OR_DEPARTMENT_OTHER): Admitting: Orthopaedic Surgery

## 2023-04-11 DIAGNOSIS — M899 Disorder of bone, unspecified: Secondary | ICD-10-CM

## 2023-04-11 NOTE — Progress Notes (Signed)
 Chief Complaint: Right femoral lesion     History of Present Illness:    Jeffery Fernandez is a 78 y.o. 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  He is here today for assessment of the right femoral lesion.  He has been having increasing pain while at rest in his wheelchair.  He states for the last several months he has noted an ongoing and rapid progression of pain in the right thigh.  He has baseline dependent on a walker for ambulation although he does not use this around his house.  He is here today for discussion of prophylactic nailing.  He is pending possible radiation to this area although is seeking prophylactic fixation prior.    PMH/PSH/Family History/Social History/Meds/Allergies:    Past Medical History:  Diagnosis Date   Arrhythmia    Arthritis    Cataract    bilateral cataract extraction with intraoccular lens implants   Chest pain, unspecified    Chronic airway obstruction, not elsewhere classified    PT. DENIES HE HAS COPD   CKD (chronic kidney disease)    Colon polyps    Adenomatous Polyps 2007   Depression    Diabetes mellitus    Diverticulosis    Gout    Heart murmur    History of kidney stones    History of radiation therapy    Thoracic Spine- 01/27/23-02/10/23- Dr. Antony Blackbird   Hyperlipemia    Hypertension    Nephrolithiasis    Normocytic anemia 04/23/2017   Obstructive sleep apnea (adult) (pediatric)    Other and unspecified hyperlipidemia    Other left bundle branch block    Retinal vascular occlusion, unspecified    right   Shortness of breath    on exertion   Unspecified sleep apnea    Vision loss of right eye    White coat syndrome with diagnosis of hypertension 09/19/2017   Past Surgical History:  Procedure Laterality Date   ANTERIOR CERVICAL DECOMP/DISCECTOMY FUSION N/A 06/26/2016   Procedure: Re-Exploration of Cervical Wound;  Surgeon: Donalee Citrin, MD;  Location: [facility] OR;  Service: Neurosurgery;  Laterality: N/A;   ANTERIOR CERVICAL DECOMP/DISCECTOMY FUSION N/A 06/26/2016   Procedure: ANTERIOR CERVICAL DECOMPRESSION/DISCECTOMY FUSION, INTERBODY PROSTESIS, PLATE, CERVICAL THREE CERVICAL FOUR, CERVICAL FOUR CERVICAL FIVE CERVICAL SIX;  Surgeon: Tressie Stalker, MD;  Location: [facility] OR;  Service: Neurosurgery;  Laterality: N/A;   APPLICATION OF WOUND VAC  06/05/2020   Procedure: APPLICATION OF WOUND VAC;  Surgeon: Tressie Stalker, MD;  Location: [facility] OR;  Service: Neurosurgery;;   BACK SURGERY     CATARACT EXTRACTION Bilateral    IR IMAGING GUIDED PORT INSERTION  02/19/2023   KIDNEY STONE SURGERY     KYPHOPLASTY N/A 01/19/2018   Procedure: KYPHOPLASTY LUMBAR THREE;  Surgeon: Tressie Stalker, MD;  Location: [facility] OR;  Service: Neurosurgery;  Laterality: N/A;  KYPHOPLASTY LUMBAR THREE   LUMBAR WOUND DEBRIDEMENT N/A 06/05/2020   Procedure: LUMBAR WOUND DEBRIDEMENT;  Surgeon: Tressie Stalker, MD;  Location: 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 Nadeen Landau from [place] and  moved back there after retirement. He  Has a daughter living in [place]. Quit smoking around 1988. Married   Right-handed   Caffeine: soda daily, decaf coffee   Social Drivers of Corporate investment banker Strain: Low Risk  (12/08/2022)   Received from [facility]   Overall Financial Resource Strain (CARDIA)    Difficulty of Paying Living Expenses: Not hard at all  Food Insecurity: No Food Insecurity (01/09/2023)   Hunger Vital Sign    Worried About Running Out of Food in the Last Year: Never true    Ran Out of Food in the Last Year: Never true  Transportation Needs: No Transportation Needs (01/09/2023)   PRAPARE - Administrator, Civil Service (Medical): No    Lack of Transportation (Non-Medical): No  Physical Activity: Unknown (12/08/2022)   Received from [facility]   Exercise Vital Sign    Days of Exercise per Week: 0 days    Minutes of Exercise per Session: Not on file  Stress: Stress Concern Present (12/08/2022)   Received from [facility] Occupational Health - Occupational Stress Questionnaire    Feeling of Stress : To some extent  Social Connections: Socially Integrated (12/08/2022)   Received from [facility]   Social Network    How would you rate your social network (family, work, friends)?: Good participation with social networks   Family History  Problem Relation Age of Onset   Cancer Mother    Uterine cancer Mother    Heart disease Father    Stroke Father    Cancer Sister        Lung cancer   Heart attack Brother 60       heart attack and CHF/ also some form of EP ablation   Parkinson's disease Brother    Atrial fibrillation Brother    Arthritis/Rheumatoid Cousin    Diabetes Other    Colon cancer Neg Hx    Stomach cancer Neg Hx    Liver cancer Neg Hx    Rectal cancer Neg Hx    Esophageal cancer Neg Hx    Allergies  Allergen Reactions   Amlodipine Swelling    Patient report facial swelling at high doses    Penicillins Other (See Comments)    Possibly rash - not sure Has patient had a PCN reaction causing immediate rash, facial/tongue/throat swelling, SOB or lightheadedness with hypotension:unknown Has patient had a PCN reaction causing severe rash involving mucus membranes or skin necrosis: Unknown Has patient had a PCN reaction that required hospitalization: Unknown Has patient had a PCN reaction occurring within the last 10 years: No Childhood reaction If all of the above answers are "NO", then may proceed with Cephalosporin use.  Hydralazine Other (See Comments)    Dizziness   Current Outpatient Medications  Medication Sig Dispense Refill   acetaminophen (TYLENOL) 500 MG tablet Take 1,000 mg by mouth every 6 (six) hours as needed (for pain.).     aspirin EC 81 MG tablet Take 81 mg by mouth daily. Swallow whole.     atorvastatin (LIPITOR) 40 MG tablet Take 40 mg by mouth at bedtime.     buPROPion (WELLBUTRIN XL) 150 MG 24 hr tablet Take 150 mg by mouth every morning.     busPIRone (BUSPAR) 10 MG tablet Take 10 mg by mouth in the morning.     Dulaglutide (TRULICITY) 1.5 MG/0.5ML SOAJ Inject 1.5 mg into the skin daily. Takes on Thursdays     famotidine (PEPCID) 20 MG tablet Take 1 tablet (20 mg total) by mouth 2 (two) times daily. 60 tablet 3   gabapentin (NEURONTIN) 100 MG capsule Take 1 capsule (100 mg total) by mouth daily.     HYDROcodone-acetaminophen (NORCO/VICODIN) 5-325 MG tablet Take 1 tablet by mouth every 6 (six) hours as needed for moderate pain (pain score 4-6) or severe pain (pain score 7-10). 60 tablet 0   insulin glargine (LANTUS) 100 UNIT/ML injection Inject 40 Units into the skin in the morning.     lidocaine-prilocaine (EMLA) cream Apply to affected area once 30 g 3   LORazepam (ATIVAN) 0.5 MG tablet Take 1 tablet (0.5 mg total) by mouth every 8 (eight) hours as needed for anxiety. 0.5 mg p.o. every 8 hours as needed for anxiety/nausea. 30 tablet 0   ondansetron  (ZOFRAN) 4 MG tablet Take 1 tablet (4 mg total) by mouth every 8 (eight) hours as needed for nausea or vomiting. 30 tablet 2   prochlorperazine (COMPAZINE) 10 MG tablet Take 1 tablet (10 mg total) by mouth every 6 (six) hours as needed for nausea or vomiting. 30 tablet 1   sertraline (ZOLOFT) 100 MG tablet Take 100 mg by mouth in the morning.     torsemide (DEMADEX) 20 MG tablet Take 2 tablets (40 mg total) by mouth daily. (Patient not taking: Reported on 03/27/2023) 60 tablet 0   No current facility-administered medications for this visit.   No results found.  Review of Systems:   A ROS was performed including pertinent positives and negatives as documented in the HPI.  Physical Exam :   Constitutional: NAD and appears stated age Neurological: Alert and oriented Psych: Appropriate affect and cooperative There were no vitals taken for this visit.   Comprehensive Musculoskeletal Exam:    Tenderness about the right thigh.  He is able to perform a straight leg raise on the right.  No pain about the hip joint.   Imaging:   Xray (2 views right femur): Right diaphyseal femoral lesion  MRI (right femur): Physeal femoral lesion involving approximately half of the cortex of the mid femur   I personally reviewed and interpreted the radiographs.   Assessment and Plan:   78 y.o. male with a metastatic lesion non-small cell to the right midshaft femur.  He does have known history of bony metastasis.  Today's visit I did discuss the role for intramedullary fixation in order to prevent future fracture particularly given the fact that he is undergoing radiation to this area.  I did discuss the risks and benefits.  I did discuss the limitations as well as associated rehab.  I did discuss that we would plan for overnight observation following this.  After discussion he is  elected for this  -Plan for right femur prophylactic intramedullary nailing   After a lengthy discussion of treatment  options, including risks, benefits, alternatives, complications of surgical and nonsurgical conservative options, the patient elected surgical repair.   The patient  is aware of the material risks  and complications including, but not limited to injury to adjacent structures, neurovascular injury, infection, numbness, bleeding, implant failure, thermal burns, stiffness, persistent pain, failure to heal, disease transmission from allograft, need for further surgery, dislocation, anesthetic risks, blood clots, risks of death,and others. The probabilities of surgical success and failure discussed with patient given their particular co-morbidities.The time and nature of expected rehabilitation and recovery was discussed.The patient's questions were all answered preoperatively.  No barriers to understanding were noted. I explained the natural history of the disease process and Rx rationale.  I explained to the patient what I considered to be reasonable expectations given their personal situation.  The final treatment plan was arrived at through a shared patient decision making process model.    I personally saw and evaluated the patient, and participated in the management and treatment plan.  Huel Cote, MD Attending Physician, Orthopedic Surgery  This document was dictated using Dragon voice recognition software. A reasonable attempt at proof reading has been made to minimize errors.

## 2023-04-11 NOTE — H&P (View-Only) (Signed)
 Chief Complaint: Right femoral lesion     History of Present Illness:    Jeffery Fernandez is a 78 y.o. 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  He is here today for assessment of the right femoral lesion.  He has been having increasing pain while at rest in his wheelchair.  He states for the last several months he has noted an ongoing and rapid progression of pain in the right thigh.  He has baseline dependent on a walker for ambulation although he does not use this around his house.  He is here today for discussion of prophylactic nailing.  He is pending possible radiation to this area although is seeking prophylactic fixation prior.    PMH/PSH/Family History/Social History/Meds/Allergies:    Past Medical History:  Diagnosis Date   Arrhythmia    Arthritis    Cataract    bilateral cataract extraction with intraoccular lens implants   Chest pain, unspecified    Chronic airway obstruction, not elsewhere classified    PT. DENIES HE HAS COPD   CKD (chronic kidney disease)    Colon polyps    Adenomatous Polyps 2007   Depression    Diabetes mellitus    Diverticulosis    Gout    Heart murmur    History of kidney stones    History of radiation therapy    Thoracic Spine- 01/27/23-02/10/23- Dr. Antony Blackbird   Hyperlipemia    Hypertension    Nephrolithiasis    Normocytic anemia 04/23/2017   Obstructive sleep apnea (adult) (pediatric)    Other and unspecified hyperlipidemia    Other left bundle branch block    Retinal vascular occlusion, unspecified    right   Shortness of breath    on exertion   Unspecified sleep apnea    Vision loss of right eye    White coat syndrome with diagnosis of hypertension 09/19/2017   Past Surgical History:  Procedure Laterality Date   ANTERIOR CERVICAL DECOMP/DISCECTOMY FUSION N/A 06/26/2016   Procedure: Re-Exploration of Cervical Wound;  Surgeon: Donalee Citrin, MD;  Location: [facility] OR;  Service: Neurosurgery;  Laterality: N/A;   ANTERIOR CERVICAL DECOMP/DISCECTOMY FUSION N/A 06/26/2016   Procedure: ANTERIOR CERVICAL DECOMPRESSION/DISCECTOMY FUSION, INTERBODY PROSTESIS, PLATE, CERVICAL THREE CERVICAL FOUR, CERVICAL FOUR CERVICAL FIVE CERVICAL SIX;  Surgeon: Tressie Stalker, MD;  Location: [facility] OR;  Service: Neurosurgery;  Laterality: N/A;   APPLICATION OF WOUND VAC  06/05/2020   Procedure: APPLICATION OF WOUND VAC;  Surgeon: Tressie Stalker, MD;  Location: [facility] OR;  Service: Neurosurgery;;   BACK SURGERY     CATARACT EXTRACTION Bilateral    IR IMAGING GUIDED PORT INSERTION  02/19/2023   KIDNEY STONE SURGERY     KYPHOPLASTY N/A 01/19/2018   Procedure: KYPHOPLASTY LUMBAR THREE;  Surgeon: Tressie Stalker, MD;  Location: [facility] OR;  Service: Neurosurgery;  Laterality: N/A;  KYPHOPLASTY LUMBAR THREE   LUMBAR WOUND DEBRIDEMENT N/A 06/05/2020   Procedure: LUMBAR WOUND DEBRIDEMENT;  Surgeon: Tressie Stalker, MD;  Location: 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 Nadeen Landau from [place] and  moved back there after retirement. He  Has a daughter living in [place]. Quit smoking around 1988. Married   Right-handed   Caffeine: soda daily, decaf coffee   Social Drivers of Corporate investment banker Strain: Low Risk  (12/08/2022)   Received from [facility]   Overall Financial Resource Strain (CARDIA)    Difficulty of Paying Living Expenses: Not hard at all  Food Insecurity: No Food Insecurity (01/09/2023)   Hunger Vital Sign    Worried About Running Out of Food in the Last Year: Never true    Ran Out of Food in the Last Year: Never true  Transportation Needs: No Transportation Needs (01/09/2023)   PRAPARE - Administrator, Civil Service (Medical): No    Lack of Transportation (Non-Medical): No  Physical Activity: Unknown (12/08/2022)   Received from [facility]   Exercise Vital Sign    Days of Exercise per Week: 0 days    Minutes of Exercise per Session: Not on file  Stress: Stress Concern Present (12/08/2022)   Received from [facility] Occupational Health - Occupational Stress Questionnaire    Feeling of Stress : To some extent  Social Connections: Socially Integrated (12/08/2022)   Received from [facility]   Social Network    How would you rate your social network (family, work, friends)?: Good participation with social networks   Family History  Problem Relation Age of Onset   Cancer Mother    Uterine cancer Mother    Heart disease Father    Stroke Father    Cancer Sister        Lung cancer   Heart attack Brother 60       heart attack and CHF/ also some form of EP ablation   Parkinson's disease Brother    Atrial fibrillation Brother    Arthritis/Rheumatoid Cousin    Diabetes Other    Colon cancer Neg Hx    Stomach cancer Neg Hx    Liver cancer Neg Hx    Rectal cancer Neg Hx    Esophageal cancer Neg Hx    Allergies  Allergen Reactions   Amlodipine Swelling    Patient report facial swelling at high doses    Penicillins Other (See Comments)    Possibly rash - not sure Has patient had a PCN reaction causing immediate rash, facial/tongue/throat swelling, SOB or lightheadedness with hypotension:unknown Has patient had a PCN reaction causing severe rash involving mucus membranes or skin necrosis: Unknown Has patient had a PCN reaction that required hospitalization: Unknown Has patient had a PCN reaction occurring within the last 10 years: No Childhood reaction If all of the above answers are "NO", then may proceed with Cephalosporin use.  Hydralazine Other (See Comments)    Dizziness   Current Outpatient Medications  Medication Sig Dispense Refill   acetaminophen (TYLENOL) 500 MG tablet Take 1,000 mg by mouth every 6 (six) hours as needed (for pain.).     aspirin EC 81 MG tablet Take 81 mg by mouth daily. Swallow whole.     atorvastatin (LIPITOR) 40 MG tablet Take 40 mg by mouth at bedtime.     buPROPion (WELLBUTRIN XL) 150 MG 24 hr tablet Take 150 mg by mouth every morning.     busPIRone (BUSPAR) 10 MG tablet Take 10 mg by mouth in the morning.     Dulaglutide (TRULICITY) 1.5 MG/0.5ML SOAJ Inject 1.5 mg into the skin daily. Takes on Thursdays     famotidine (PEPCID) 20 MG tablet Take 1 tablet (20 mg total) by mouth 2 (two) times daily. 60 tablet 3   gabapentin (NEURONTIN) 100 MG capsule Take 1 capsule (100 mg total) by mouth daily.     HYDROcodone-acetaminophen (NORCO/VICODIN) 5-325 MG tablet Take 1 tablet by mouth every 6 (six) hours as needed for moderate pain (pain score 4-6) or severe pain (pain score 7-10). 60 tablet 0   insulin glargine (LANTUS) 100 UNIT/ML injection Inject 40 Units into the skin in the morning.     lidocaine-prilocaine (EMLA) cream Apply to affected area once 30 g 3   LORazepam (ATIVAN) 0.5 MG tablet Take 1 tablet (0.5 mg total) by mouth every 8 (eight) hours as needed for anxiety. 0.5 mg p.o. every 8 hours as needed for anxiety/nausea. 30 tablet 0   ondansetron  (ZOFRAN) 4 MG tablet Take 1 tablet (4 mg total) by mouth every 8 (eight) hours as needed for nausea or vomiting. 30 tablet 2   prochlorperazine (COMPAZINE) 10 MG tablet Take 1 tablet (10 mg total) by mouth every 6 (six) hours as needed for nausea or vomiting. 30 tablet 1   sertraline (ZOLOFT) 100 MG tablet Take 100 mg by mouth in the morning.     torsemide (DEMADEX) 20 MG tablet Take 2 tablets (40 mg total) by mouth daily. (Patient not taking: Reported on 03/27/2023) 60 tablet 0   No current facility-administered medications for this visit.   No results found.  Review of Systems:   A ROS was performed including pertinent positives and negatives as documented in the HPI.  Physical Exam :   Constitutional: NAD and appears stated age Neurological: Alert and oriented Psych: Appropriate affect and cooperative There were no vitals taken for this visit.   Comprehensive Musculoskeletal Exam:    Tenderness about the right thigh.  He is able to perform a straight leg raise on the right.  No pain about the hip joint.   Imaging:   Xray (2 views right femur): Right diaphyseal femoral lesion  MRI (right femur): Physeal femoral lesion involving approximately half of the cortex of the mid femur   I personally reviewed and interpreted the radiographs.   Assessment and Plan:   78 y.o. male with a metastatic lesion non-small cell to the right midshaft femur.  He does have known history of bony metastasis.  Today's visit I did discuss the role for intramedullary fixation in order to prevent future fracture particularly given the fact that he is undergoing radiation to this area.  I did discuss the risks and benefits.  I did discuss the limitations as well as associated rehab.  I did discuss that we would plan for overnight observation following this.  After discussion he is  elected for this  -Plan for right femur prophylactic intramedullary nailing   After a lengthy discussion of treatment  options, including risks, benefits, alternatives, complications of surgical and nonsurgical conservative options, the patient elected surgical repair.   The patient  is aware of the material risks  and complications including, but not limited to injury to adjacent structures, neurovascular injury, infection, numbness, bleeding, implant failure, thermal burns, stiffness, persistent pain, failure to heal, disease transmission from allograft, need for further surgery, dislocation, anesthetic risks, blood clots, risks of death,and others. The probabilities of surgical success and failure discussed with patient given their particular co-morbidities.The time and nature of expected rehabilitation and recovery was discussed.The patient's questions were all answered preoperatively.  No barriers to understanding were noted. I explained the natural history of the disease process and Rx rationale.  I explained to the patient what I considered to be reasonable expectations given their personal situation.  The final treatment plan was arrived at through a shared patient decision making process model.    I personally saw and evaluated the patient, and participated in the management and treatment plan.  Huel Cote, MD Attending Physician, Orthopedic Surgery  This document was dictated using Dragon voice recognition software. A reasonable attempt at proof reading has been made to minimize errors.

## 2023-04-15 ENCOUNTER — Inpatient Hospital Stay: Payer: HMO

## 2023-04-16 ENCOUNTER — Encounter: Payer: Self-pay | Admitting: Internal Medicine

## 2023-04-22 ENCOUNTER — Inpatient Hospital Stay: Payer: HMO | Admitting: Internal Medicine

## 2023-04-22 ENCOUNTER — Encounter: Payer: Self-pay | Admitting: Nurse Practitioner

## 2023-04-22 ENCOUNTER — Inpatient Hospital Stay: Payer: HMO

## 2023-04-22 ENCOUNTER — Inpatient Hospital Stay (HOSPITAL_BASED_OUTPATIENT_CLINIC_OR_DEPARTMENT_OTHER): Admitting: Nurse Practitioner

## 2023-04-22 ENCOUNTER — Other Ambulatory Visit: Payer: Self-pay | Admitting: Internal Medicine

## 2023-04-22 VITALS — BP 131/72 | HR 97 | Temp 98.8°F | Resp 18 | Wt 170.4 lb

## 2023-04-22 DIAGNOSIS — C3490 Malignant neoplasm of unspecified part of unspecified bronchus or lung: Secondary | ICD-10-CM | POA: Diagnosis not present

## 2023-04-22 DIAGNOSIS — G893 Neoplasm related pain (acute) (chronic): Secondary | ICD-10-CM

## 2023-04-22 DIAGNOSIS — Z515 Encounter for palliative care: Secondary | ICD-10-CM

## 2023-04-22 DIAGNOSIS — Z95828 Presence of other vascular implants and grafts: Secondary | ICD-10-CM

## 2023-04-22 DIAGNOSIS — C3411 Malignant neoplasm of upper lobe, right bronchus or lung: Secondary | ICD-10-CM

## 2023-04-22 DIAGNOSIS — Z5112 Encounter for antineoplastic immunotherapy: Secondary | ICD-10-CM | POA: Diagnosis not present

## 2023-04-22 LAB — CBC WITH DIFFERENTIAL (CANCER CENTER ONLY)
Abs Immature Granulocytes: 0.03 10*3/uL (ref 0.00–0.07)
Basophils Absolute: 0 10*3/uL (ref 0.0–0.1)
Basophils Relative: 0 %
Eosinophils Absolute: 0.1 10*3/uL (ref 0.0–0.5)
Eosinophils Relative: 1 %
HCT: 30.8 % — ABNORMAL LOW (ref 39.0–52.0)
Hemoglobin: 10 g/dL — ABNORMAL LOW (ref 13.0–17.0)
Immature Granulocytes: 0 %
Lymphocytes Relative: 24 %
Lymphs Abs: 1.9 10*3/uL (ref 0.7–4.0)
MCH: 30.8 pg (ref 26.0–34.0)
MCHC: 32.5 g/dL (ref 30.0–36.0)
MCV: 94.8 fL (ref 80.0–100.0)
Monocytes Absolute: 0.5 10*3/uL (ref 0.1–1.0)
Monocytes Relative: 6 %
Neutro Abs: 5.6 10*3/uL (ref 1.7–7.7)
Neutrophils Relative %: 69 %
Platelet Count: 192 10*3/uL (ref 150–400)
RBC: 3.25 MIL/uL — ABNORMAL LOW (ref 4.22–5.81)
RDW: 16.1 % — ABNORMAL HIGH (ref 11.5–15.5)
WBC Count: 8.1 10*3/uL (ref 4.0–10.5)
nRBC: 0 % (ref 0.0–0.2)

## 2023-04-22 LAB — CMP (CANCER CENTER ONLY)
ALT: 9 U/L (ref 0–44)
AST: 12 U/L — ABNORMAL LOW (ref 15–41)
Albumin: 3.3 g/dL — ABNORMAL LOW (ref 3.5–5.0)
Alkaline Phosphatase: 119 U/L (ref 38–126)
Anion gap: 8 (ref 5–15)
BUN: 28 mg/dL — ABNORMAL HIGH (ref 8–23)
CO2: 28 mmol/L (ref 22–32)
Calcium: 8.9 mg/dL (ref 8.9–10.3)
Chloride: 100 mmol/L (ref 98–111)
Creatinine: 2.89 mg/dL — ABNORMAL HIGH (ref 0.61–1.24)
GFR, Estimated: 21 mL/min — ABNORMAL LOW (ref >60)
Glucose, Bld: 202 mg/dL — ABNORMAL HIGH (ref 70–99)
Potassium: 4 mmol/L (ref 3.5–5.1)
Sodium: 136 mmol/L (ref 135–145)
Total Bilirubin: 0.4 mg/dL (ref 0.0–1.2)
Total Protein: 7.2 g/dL (ref 6.5–8.1)

## 2023-04-22 MED ORDER — SODIUM CHLORIDE 0.9% FLUSH
10.0000 mL | Freq: Once | INTRAVENOUS | Status: AC
  Administered 2023-04-22: 10 mL

## 2023-04-22 MED ORDER — LORAZEPAM 0.5 MG PO TABS: 0.5000 mg | ORAL_TABLET | Freq: Three times a day (TID) | ORAL | 0 refills | Status: DC | PRN

## 2023-04-22 MED ORDER — SODIUM CHLORIDE 0.9 % IV SOLN: INTRAVENOUS | Status: DC

## 2023-04-22 MED ORDER — SODIUM CHLORIDE 0.9% FLUSH
10.0000 mL | INTRAVENOUS | Status: DC | PRN
  Administered 2023-04-22: 10 mL

## 2023-04-22 MED ORDER — SODIUM CHLORIDE 0.9 % IV SOLN
360.0000 mg | Freq: Once | INTRAVENOUS | Status: AC
  Administered 2023-04-22: 360 mg via INTRAVENOUS
  Filled 2023-04-22: qty 12

## 2023-04-22 MED ORDER — HEPARIN SOD (PORK) LOCK FLUSH 100 UNIT/ML IV SOLN
500.0000 [IU] | Freq: Once | INTRAVENOUS | Status: AC | PRN
  Administered 2023-04-22: 500 [IU]

## 2023-04-22 NOTE — Patient Instructions (Signed)
 [facility] - A DEPT OF [facility]  Discharge Instructions: Thank you for choosing [facility] to provide your oncology and hematology care.   If you have a lab appointment with the Cancer Center, please go directly to the Cancer Center and check in at the registration area.   Wear comfortable clothing and clothing appropriate for easy access to any Portacath or PICC line.   We strive to give you quality time with your provider. You may need to reschedule your appointment if you arrive late (15 or more minutes).  Arriving late affects you and other patients whose appointments are after yours.  Also, if you miss three or more appointments without notifying the office, you may be dismissed from the clinic at the provider's discretion.      For prescription refill requests, have your pharmacy contact our office and allow 72 hours for refills to be completed.    Today you received the following chemotherapy and/or immunotherapy agents: Opdivo      To help prevent nausea and vomiting after your treatment, we encourage you to take your nausea medication as directed.  BELOW ARE SYMPTOMS THAT SHOULD BE REPORTED IMMEDIATELY: *FEVER GREATER THAN 100.4 F (38 C) OR HIGHER *CHILLS OR SWEATING *NAUSEA AND VOMITING THAT IS NOT CONTROLLED WITH YOUR NAUSEA MEDICATION *UNUSUAL SHORTNESS OF BREATH *UNUSUAL BRUISING OR BLEEDING *URINARY PROBLEMS (pain or burning when urinating, or frequent urination) *BOWEL PROBLEMS (unusual diarrhea, constipation, pain near the anus) TENDERNESS IN MOUTH AND THROAT WITH OR WITHOUT PRESENCE OF ULCERS (sore throat, sores in mouth, or a toothache) UNUSUAL RASH, SWELLING OR PAIN  UNUSUAL VAGINAL DISCHARGE OR ITCHING   Items with * indicate a potential emergency and should be followed up as soon as possible or go to the Emergency Department if any problems should occur.  Please show the CHEMOTHERAPY ALERT CARD or IMMUNOTHERAPY  ALERT CARD at check-in to the Emergency Department and triage nurse.  Should you have questions after your visit or need to cancel or reschedule your appointment, please contact [facility] - A DEPT OF [facility]  Dept: [phone]  and follow the prompts.  Office hours are 8:00 a.m. to 4:30 p.m. Monday - Friday. Please note that voicemails left after 4:00 p.m. may not be returned until the following business day.  We are closed weekends and major holidays. You have access to a nurse at all times for urgent questions. Please call the main number to the clinic Dept: [phone] and follow the prompts.   For any non-urgent questions, you may also contact your provider using MyChart. We now offer e-Visits for anyone 57 and older to request care online for non-urgent symptoms. For details visit [email or web address].   Also download the MyChart app! Go to the app store, search "MyChart", open the app, select , and log in with your MyChart username and password.

## 2023-04-22 NOTE — Progress Notes (Signed)
 Per Dr. Arbutus Ped ,it is ok to treat pt today with Nivolumab and Ipilimumab and creatinine = 2.89.

## 2023-04-22 NOTE — Progress Notes (Signed)
 Palliative Medicine [facility]  Telephone:([phone]) [phone] [phone]) [phone]   Name: Jeffery Fernandez Date: 04/22/2023 MRN: [id number]  DOB: Feb 11, 1945  Patient Care Team: Jeffery Harries, MD as PCP - General (Family Medicine) Jeffery Si, MD as PCP - Cardiology (Cardiology) Jeffery Fernandez, Jeffery Baumgartner, NP as Nurse Practitioner (Hospice and Palliative Medicine)    INTERVAL HISTORY: Jeffery Fernandez is a 78 y.o. 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 As well as COPD, DM type 2, arthritis, hypertension, and hyperlipidemia. Palliative ask to see for symptom management and goals of care.   SOCIAL HISTORY:     reports that he quit smoking about 37 years ago. His smoking use included cigarettes. He started smoking about 62 years ago. He has a 37.5 pack-year smoking history. He has never used smokeless tobacco. He reports that he does not currently use alcohol. He reports that he does not use drugs.  ADVANCE DIRECTIVES:  Advanced directives on file name Jeffery Fernandez as the primary and Jeffery Fernandez as secondary decision makers should the patient become unable to make his own decisions.  CODE STATUS: Full code  PAST MEDICAL HISTORY: Past Medical History:  Diagnosis Date   Arrhythmia    Arthritis    Cataract    bilateral cataract extraction with intraoccular lens implants   Chest pain, unspecified    Chronic airway obstruction, not elsewhere classified    PT. DENIES HE HAS COPD   CKD (chronic kidney disease)    Colon polyps    Adenomatous Polyps 2007   Depression    Diabetes mellitus    Diverticulosis    Gout    Heart murmur    History of kidney stones    History of radiation therapy    Thoracic Spine- 01/27/23-02/10/23- Dr. Antony Fernandez   Hyperlipemia    Hypertension    Nephrolithiasis    Normocytic anemia 04/23/2017   Obstructive sleep apnea (adult) (pediatric)    Other and unspecified hyperlipidemia     Other left bundle branch block    Retinal vascular occlusion, unspecified    right   Shortness of breath    on exertion   Unspecified sleep apnea    Vision loss of right eye    White coat syndrome with diagnosis of hypertension 09/19/2017    ALLERGIES:  is allergic to amlodipine, penicillins, and hydralazine.  MEDICATIONS:  Current Outpatient Medications  Medication Sig Dispense Refill   acetaminophen (TYLENOL) 500 MG tablet Take 1,000 mg by mouth every 6 (six) hours as needed (for pain.).     aspirin EC 81 MG tablet Take 81 mg by mouth daily. Swallow whole.     atorvastatin (LIPITOR) 40 MG tablet Take 40 mg by mouth at bedtime.     buPROPion (WELLBUTRIN XL) 150 MG 24 hr tablet Take 150 mg by mouth every morning.     busPIRone (BUSPAR) 10 MG tablet Take 10 mg by mouth in the morning.     Dulaglutide (TRULICITY) 1.5 MG/0.5ML SOAJ Inject 1.5 mg into the skin daily. Takes on Thursdays     famotidine (PEPCID) 20 MG tablet Take 1 tablet (20 mg total) by mouth 2 (two) times daily. 60 tablet 3   gabapentin (NEURONTIN) 100 MG capsule Take 1 capsule (100 mg total) by mouth daily.     HYDROcodone-acetaminophen (NORCO/VICODIN) 5-325 MG tablet Take 1 tablet by mouth every 6 (six) hours as needed for moderate pain (pain score 4-6) or severe pain (pain  score 7-10). 60 tablet 0   insulin glargine (LANTUS) 100 UNIT/ML injection Inject 40 Units into the skin in the morning.     lidocaine-prilocaine (EMLA) cream Apply to affected area once 30 g 3   LORazepam (ATIVAN) 0.5 MG tablet Take 1 tablet (0.5 mg total) by mouth every 8 (eight) hours as needed for anxiety. 0.5 mg p.o. every 8 hours as needed for anxiety/nausea. 30 tablet 0   ondansetron (ZOFRAN) 4 MG tablet Take 1 tablet (4 mg total) by mouth every 8 (eight) hours as needed for nausea or vomiting. 30 tablet 2   prochlorperazine (COMPAZINE) 10 MG tablet Take 1 tablet (10 mg total) by mouth every 6 (six) hours as needed for nausea or vomiting. 30  tablet 1   sertraline (ZOLOFT) 100 MG tablet Take 100 mg by mouth in the morning.     torsemide (DEMADEX) 20 MG tablet Take 2 tablets (40 mg total) by mouth daily. (Patient not taking: Reported on 03/27/2023) 60 tablet 0   No current facility-administered medications for this visit.   Facility-Administered Medications Ordered in Other Visits  Medication Dose Route Frequency Provider Last Rate Last Admin   0.9 %  sodium chloride infusion   Intravenous Continuous Fernandez Gaul, MD   Stopped at 04/22/23 1419   sodium chloride flush (NS) 0.9 % injection 10 mL  10 mL Intracatheter PRN Fernandez Gaul, MD   10 mL at 04/22/23 1421    VITAL SIGNS: There were no vitals taken for this visit. There were no vitals filed for this visit.  Estimated body mass index is 25.16 kg/m as calculated from the following:   Height as of 04/01/23: 5\' 9"  (1.753 m).   Weight as of an earlier encounter on 04/22/23: 170 lb 6.4 oz (77.3 kg).   PERFORMANCE STATUS (ECOG) : 1 - Symptomatic but completely ambulatory   Physical Exam General: NAD Cardiovascular: regular rate and rhythm Pulmonary: normal breathing pattern  Extremities: no edema, no joint deformities Skin: no rashes Neurological: AAOx3  Discussed the use of AI scribe software for clinical note transcription with the patient, who gave verbal consent to proceed.  IMPRESSION:  I saw Mr. Jeffery Fernandez during his infusion. No acute distress. He is doing much better than previous weeks. Family much appreciative of this. Denies concerns with nausea, vomiting, constipation, or diarrhea. Continues to take anti-emetics as needed for nausea. Appetite is good. Weight is up to 170lbs from 168lbs on 3/4. Patient observed up and ambulating independently during his infusion today.   Daughter shares some cognitive changes. One day he inquired about a family pet that passed away several years ago. Family aware to continue close monitoring and notify oncology medical team of  significant changes or more consistent cognitive changes.   Mr. Jeffery Fernandez pain remains controlled as best possible with current regimen. He has known T10 and femur disease. Per recommendations from Dr. Shirline Frees the plan is to pursue orthopedic surgery for symptom management. Gabapentin is taken once daily, which is the maximum he can tolerate. Hydrocodone on hand as needed. Ativan as needed for anxiety. No adjustments to regimen at this time.   All questions answered and support provided.  I discussed the importance of continued conversation with family and their medical providers regarding overall plan of care and treatment options, ensuring decisions are within the context of the patients values and GOCs.  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 Hydrocodone 1 tablet twice daily provides minimal relief.  Patient is also on Gabapentin 1 tablet daily. -Hydrocodone to 1.5 tablets every 4-6 hours as needed for pain. -If 1.5 tablets are ineffective, patient may increase to 2 tablets. -Continue Gabapentin 1 tablet daily. -Plan for orthopedic surgery per Oncology.  -Completed radiation.  -We will continue to closely monitor pain.   Nausea Patient experiences morning nausea, managed with daily anti-nausea medication. -Continue current anti-nausea regimen.  Follow-up in 3-4 weeks unless patient's pain becomes unmanageable or other issues arise. Monitor for constipation due to increased Hydrocodone use. If constipation occurs, consider increasing stool softeners to twice daily (2 tablets).  Patient expressed understanding and was in agreement with this plan. He also understands that He can call the clinic at any time with any questions, concerns, or complaints.   Any controlled substances utilized were prescribed in the context of palliative care. PDMP has been reviewed.   Visit consisted of counseling and education dealing with the complex and emotionally  intense issues of symptom management and palliative care in the setting of serious and potentially life-threatening illness.  Willette Alma, AGPCNP-BC  Palliative Medicine Team/[facility]

## 2023-04-22 NOTE — Progress Notes (Signed)
 [facility] [phone]   [phone]  OFFICE PROGRESS NOTE  [name], MD [address] [place] [address]  DIAGNOSIS: Stage IV (T2b, N3, M1b) non-small cell lung cancer, adenocarcinoma . This was diagnosed in December 2024 and presented with large right apical lung mass in addition to right hilar, mediastinal and supraclavicular lymphadenopathy as well as pulmonary nodules in the right upper lobe and right lower lobe with T10 bone metastasis in addition to medial left inguinal lymph node versus subcutaneous nodule.   Biomarker Findings HRD signature - HRDsig Negative Microsatellite status - Cannot Be Determined ? Tumor Mutational Burden - 8 Muts/Mb Genomic Findings For a complete list of the genes assayed, please refer to the Appendix. CDKN2A loss KEAP1 K2fs*60 STK11 E291* CDKN2B loss IKZF1 R111* TP53 R280K 8 Disease relevant genes with no reportable alterations: ALK, BRAF, EGFR, ERBB2, KRAS, MET, RET, ROS1  PRIOR THERAPY: Palliative radiotherapy to the T10 bone metastasis.  CURRENT THERAPY: First-line treatment with chemoimmunotherapy in the form of carboplatin for AUC of 5 and paclitaxel 175 Mg/M2 on days 1 and 22 in addition to ipilimumab 1 Mg/KG on day 1 and nivolumab 360 Mg IV on days 1 and 22 for 1 cycle followed by maintenance treatment with ipilimumab 1 Mg/KG every 6 weeks and nivolumab 360 Mg IV every 3 weeks.  First dose February 18, 2023.  Chemotherapy was discontinued starting from day 22 of cycle #1 secondary to intolerance.  INTERVAL HISTORY: Jeffery Fernandez 78 y.o. male returns to the clinic today for follow-up visit accompanied by his daughter.Discussed the use of AI scribe software for clinical note transcription with the patient, who gave verbal consent to proceed.  History of Present Illness   Jeffery Fernandez is a 78 year old male with stage 4 non-small cell lung cancer adenocarcinoma who presents for  day 22 of cycle 2 of immunotherapy. He is accompanied by his daughter.  He was diagnosed with stage 4 non-small cell lung cancer adenocarcinoma in December 2024 and is currently undergoing chemo-immunotherapy. He is on day 22 of cycle 2 and feels significantly better compared to previous visits. He has completed one cycle of treatment and is now in the second cycle.  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 There is also a metastasis in the femur, and urgent orthopedic surgery is planned, pending clearance from his primary care provider.  He experiences occasional coughing with white phlegm but no hemoptysis. He has some nausea and dizziness, particularly upon standing, but these symptoms are transient. No chest pain or significant breathing issues are reported.  He has experienced some cognitive changes, which may be related to his treatment. A recent MRI of the brain was performed at the time of his cancer diagnosis as part of the initial workup.          MEDICAL HISTORY: Past Medical History:  Diagnosis Date   Arrhythmia    Arthritis    Cataract    bilateral cataract extraction with intraoccular lens implants   Chest pain, unspecified    Chronic airway obstruction, not elsewhere classified    PT. DENIES HE HAS COPD   CKD (chronic kidney disease)    Colon polyps    Adenomatous Polyps 2007   Depression    Diabetes mellitus    Diverticulosis    Gout    Heart murmur    History of kidney  stones    History of radiation therapy    Thoracic Spine- 01/27/23-02/10/23- Dr. Antony Blackbird   Hyperlipemia    Hypertension    Nephrolithiasis    Normocytic anemia 04/23/2017   Obstructive sleep apnea (adult) (pediatric)    Other and unspecified hyperlipidemia    Other left bundle branch block    Retinal vascular occlusion, unspecified    right   Shortness of breath    on exertion   Unspecified sleep apnea    Vision loss of  right eye    White coat syndrome with diagnosis of hypertension 09/19/2017    ALLERGIES:  is allergic to amlodipine, penicillins, and hydralazine.  MEDICATIONS:  Current Outpatient Medications  Medication Sig Dispense Refill   acetaminophen (TYLENOL) 500 MG tablet Take 1,000 mg by mouth every 6 (six) hours as needed (for pain.).     aspirin EC 81 MG tablet Take 81 mg by mouth daily. Swallow whole.     atorvastatin (LIPITOR) 40 MG tablet Take 40 mg by mouth at bedtime.     buPROPion (WELLBUTRIN XL) 150 MG 24 hr tablet Take 150 mg by mouth every morning.     busPIRone (BUSPAR) 10 MG tablet Take 10 mg by mouth in the morning.     Dulaglutide (TRULICITY) 1.5 MG/0.5ML SOAJ Inject 1.5 mg into the skin daily. Takes on Thursdays     famotidine (PEPCID) 20 MG tablet Take 1 tablet (20 mg total) by mouth 2 (two) times daily. 60 tablet 3   gabapentin (NEURONTIN) 100 MG capsule Take 1 capsule (100 mg total) by mouth daily.     HYDROcodone-acetaminophen (NORCO/VICODIN) 5-325 MG tablet Take 1 tablet by mouth every 6 (six) hours as needed for moderate pain (pain score 4-6) or severe pain (pain score 7-10). 60 tablet 0   insulin glargine (LANTUS) 100 UNIT/ML injection Inject 40 Units into the skin in the morning.     lidocaine-prilocaine (EMLA) cream Apply to affected area once 30 g 3   LORazepam (ATIVAN) 0.5 MG tablet Take 1 tablet (0.5 mg total) by mouth every 8 (eight) hours as needed for anxiety. 0.5 mg p.o. every 8 hours as needed for anxiety/nausea. 30 tablet 0   ondansetron (ZOFRAN) 4 MG tablet Take 1 tablet (4 mg total) by mouth every 8 (eight) hours as needed for nausea or vomiting. 30 tablet 2   prochlorperazine (COMPAZINE) 10 MG tablet Take 1 tablet (10 mg total) by mouth every 6 (six) hours as needed for nausea or vomiting. 30 tablet 1   sertraline (ZOLOFT) 100 MG tablet Take 100 mg by mouth in the morning.     torsemide (DEMADEX) 20 MG tablet Take 2 tablets (40 mg total) by mouth daily. (Patient  not taking: Reported on 03/27/2023) 60 tablet 0   No current facility-administered medications for this visit.    SURGICAL HISTORY:  Past Surgical History:  Procedure Laterality Date   ANTERIOR CERVICAL DECOMP/DISCECTOMY FUSION N/A 06/26/2016   Procedure: Re-Exploration of Cervical Wound;  Surgeon: Donalee Citrin, MD;  Location: [facility] OR;  Service: Neurosurgery;  Laterality: N/A;   ANTERIOR CERVICAL DECOMP/DISCECTOMY FUSION N/A 06/26/2016   Procedure: ANTERIOR CERVICAL DECOMPRESSION/DISCECTOMY FUSION, INTERBODY PROSTESIS, PLATE, CERVICAL THREE CERVICAL FOUR, CERVICAL FOUR CERVICAL FIVE CERVICAL SIX;  Surgeon: Tressie Stalker, MD;  Location: [facility] OR;  Service: Neurosurgery;  Laterality: N/A;   APPLICATION OF WOUND VAC  06/05/2020   Procedure: APPLICATION OF WOUND VAC;  Surgeon: Tressie Stalker, MD;  Location: [facility] OR;  Service: Neurosurgery;;   BACK  SURGERY     CATARACT EXTRACTION Bilateral    IR IMAGING GUIDED PORT INSERTION  02/19/2023   KIDNEY STONE SURGERY     KYPHOPLASTY N/A 01/19/2018   Procedure: KYPHOPLASTY LUMBAR THREE;  Surgeon: Tressie Stalker, MD;  Location: [facility] OR;  Service: Neurosurgery;  Laterality: N/A;  KYPHOPLASTY LUMBAR THREE   LUMBAR WOUND DEBRIDEMENT N/A 06/05/2020   Procedure: LUMBAR WOUND DEBRIDEMENT;  Surgeon: Tressie Stalker, MD;  Location: [facility] OR;  Service: Neurosurgery;  Laterality: N/A;    REVIEW OF SYSTEMS:  Constitutional: positive for fatigue Eyes: negative Ears, nose, mouth, throat, and face: negative Respiratory: negative Cardiovascular: negative Gastrointestinal: positive for nausea Genitourinary:negative Integument/breast: negative Hematologic/lymphatic: negative Musculoskeletal:positive for arthralgias and bone pain Neurological: negative Behavioral/Psych: negative Endocrine: negative Allergic/Immunologic: negative   PHYSICAL EXAMINATION: General appearance: alert, cooperative, fatigued, and no distress Head: Normocephalic, without obvious abnormality,  atraumatic Neck: no adenopathy, no JVD, supple, symmetrical, trachea midline, and thyroid not enlarged, symmetric, no tenderness/mass/nodules Lymph nodes: Cervical, supraclavicular, and axillary nodes normal. Resp: clear to auscultation bilaterally Back: symmetric, no curvature. ROM normal. No CVA tenderness. Cardio: regular rate and rhythm, S1, S2 normal, no murmur, click, rub or gallop GI: soft, non-tender; bowel sounds normal; no masses,  no organomegaly Extremities: extremities normal, atraumatic, no cyanosis or edema Neurologic: Alert and oriented X 3, normal strength and tone. Normal symmetric reflexes. Normal coordination and gait  ECOG PERFORMANCE STATUS: 1 - Symptomatic but completely ambulatory  Blood pressure 131/72, pulse 97, temperature 98.8 F (37.1 C), temperature source Temporal, resp. rate 18, weight 170 lb 6.4 oz (77.3 kg), SpO2 98%.  LABORATORY DATA: Lab Results  Component Value Date   WBC 8.1 04/22/2023   HGB 10.0 (L) 04/22/2023   HCT 30.8 (L) 04/22/2023   MCV 94.8 04/22/2023   PLT 192 04/22/2023      Chemistry      Component Value Date/Time   NA 137 04/01/2023 1046   NA 142 07/03/2022 1356   K 3.8 04/01/2023 1046   CL 100 04/01/2023 1046   CO2 28 04/01/2023 1046   BUN 29 (H) 04/01/2023 1046   BUN 22 07/03/2022 1356   CREATININE 3.02 (H) 04/01/2023 1046      Component Value Date/Time   CALCIUM 8.6 (L) 04/01/2023 1046   ALKPHOS 129 (H) 04/01/2023 1046   AST 13 (L) 04/01/2023 1046   ALT 9 04/01/2023 1046   BILITOT 0.5 04/01/2023 1046       RADIOGRAPHIC STUDIES: MR FEMUR RIGHT W WO CONTRAST Result Date: 04/04/2023 CLINICAL DATA:  Lytic lesion in the right femur in a patient with a history of metastatic bronchogenic carcinoma. EXAM: MRI OF THE RIGHT FEMUR WITHOUT AND WITH CONTRAST TECHNIQUE: Multiplanar, multisequence MR imaging of the right femur was performed both before and after administration of intravenous contrast. CONTRAST:  7.5 mL GADAVIST  GADOBUTROL 1 MMOL/ML IV SOLN COMPARISON:  Plain films right femur 03/27/2023. FINDINGS: Bones/Joint/Cartilage As seen on the comparison plain films, there is focus of scalloping in the cortex of the mid diaphysis of the right femur with surrounding soft tissue and marrow edema. The area cortical loss measures 2.9 cm craniocaudal by 1 cm AP by 0.7 cm transverse extending just into the adjacent vastus intermedius. The lesion is T2 hyperintense and enhances. Surrounding soft tissues and bone marrow also demonstrate enhancement. No other focal lesion is identified. Ligaments Intact. Muscles and Tendons Intact. Soft tissues No fluid collection or mass. IMPRESSION: Findings most worrisome for a metastatic lesion in the cortex of the  mid diaphysis of the right femur as described above. Electronically Signed   By: Drusilla Kanner M.D.   On: 04/04/2023 08:34   DG FEMUR PORT, MIN 2 VIEWS RIGHT Result Date: 03/27/2023 CLINICAL DATA:  Bone pain. Rule out metastasis. Right upper leg pain for 3 weeks. No injury. EXAM: RIGHT FEMUR PORTABLE 2 VIEW COMPARISON:  Right knee 12/31/2022.  Right hip 12/31/2022 FINDINGS: There is a focal area of cortical destruction along the lateral midshaft of the right femur. This was not demonstrated in the previous studies. The appearance is that of an aggressive lytic lesion with noncalcified margins. Appearances compatible with metastatic lesion or possibly myeloma. No pathologic fractures identified. Degenerative changes are demonstrated in the hip and knee. Vascular calcifications in the soft tissues. IMPRESSION: Focal lytic cortical lesion in the midshaft right femur, new since previous studies. Likely metastatic lesion. Electronically Signed   By: Burman Nieves M.D.   On: 03/27/2023 15:16    ASSESSMENT AND PLAN: This is a very pleasant 78 years old white male with  Stage IV (T2b, N3, M1b) non-small cell lung cancer, adenocarcinoma . This was diagnosed in December 2024 and presented  with large right apical lung mass in addition to right hilar, mediastinal and supraclavicular lymphadenopathy as well as pulmonary nodules in the right upper lobe and right lower lobe with T10 bone metastasis in addition to medial left inguinal lymph node versus subcutaneous nodule.  Molecular studies showed no actionable mutation and there was insufficient material for PD-L1 expression. The patient status post palliative radiotherapy to the T10 vertebral body lesion. The patient is currently undergoing first-line treatment with chemoimmunotherapy in the form of carboplatin for AUC of 5 and paclitaxel 175 Mg/M2 on days 1 and 22 in addition to ipilimumab 1 Mg/KG on day 1 and nivolumab 360 Mg IV on days 1 and 22 for 1 cycle followed by maintenance treatment with ipilimumab 1 Mg/KG every 6 weeks and nivolumab 360 Mg IV every 3 weeks.  First dose February 18, 2023.  He is status post 1 cycle.  He has a rough time with his treatment.  He had a rough time with the first cycle of his treatment with chemotherapy which will be discontinued starting from day 22 of cycle #1.  He is here today for evaluation before starting day #22 of cycle #2. The patient will continue on immunotherapy only with ipilimumab 1 Mg/Kg every 6 weeks in addition to nivolumab 360 Mg IV every 3 weeks for up to 2 years as long as the patient does not have any disease progression or unacceptable toxicity. He continues to tolerate his treatment with immunotherapy fairly well.    Stage 4 non-small cell lung cancer adenocarcinoma Diagnosed in December 2024. Currently undergoing first-line chemoimmunotherapy. Status post one cycle and on day 22 of cycle two. Reports symptom improvement with reduced pain and manageable side effects, including occasional nausea and dizziness. No significant respiratory symptoms, only white sputum without hemoptysis. Cognitive changes noted, possibly related to treatment-induced fatigue or weakness. Recent MRI brain was  clear. Plan CT scan of chest, abdomen, and pelvis 10 days prior to next visit to assess treatment response. MRI brain will be considered if significant cognitive changes occur. - Continue current chemoimmunotherapy regimen. - Order CT scan of chest, abdomen, and pelvis 10 days prior to next visit to assess treatment response. - Monitor for cognitive changes and consider MRI brain if significant changes occur.  Bone metastases Metastatic involvement at T10 and femur. Status post palliative  radiotherapy to T10. Urgent orthopedic consultation and surgery planned for femur metastasis. Awaiting clearance from primary care for surgery. Surgery is not elective and should be expedited to prevent fracture. - Ensure clearance from primary care for orthopedic surgery. - Proceed with planned orthopedic surgery for femur metastasis.   The patient was advised to call immediately if he has any concerning symptoms in the interval. The patient voices understanding of current disease status and treatment options and is in agreement with the current care plan.  All questions were answered. The patient knows to call the clinic with any problems, questions or concerns. We can certainly see the patient much sooner if necessary.  The total time spent in the appointment was 30 minutes.  Disclaimer: This note was dictated with voice recognition software. Similar sounding words can inadvertently be transcribed and may not be corrected upon review.

## 2023-04-23 NOTE — Progress Notes (Signed)
 [facility]  04/23/2023    Patient ID:  Jeffery Fernandez is a 78 y.o. (DOB 01/09/1946) male.  Assessment and Plan  Render was seen today for pre-op exam.  Diagnoses and all orders for this visit:  Pre-op evaluation -     ECG 12 lead unit performed   Assessment & Plan 1. Preoperative evaluation.  Trying to get scheduled for intramedullary rod into the left hip for metastatic lung cancer.  Needs to have the rod in place prior to oncology treating him as they are afraid he will break his hip once they start treatment.  He presents with several risk factors, including documented heart disease from a catheterization in 2017, a previous episode of heart failure, insulin -dependent diabetes, and elevated creatinine. These factors collectively contribute to an estimated 11 percent probability of experiencing atrial fibrillation or other complications during surgery, categorizing him as high risk. He does not exhibit any active cardiac conditions at present. His last echocardiogram results do not indicate any wall motion abnormalities or other concerning findings. An EKG today shows left bundle branch block.  His daughter reports he is having episodes of rapid heart rate up in the 140s.  Question of whether or not he is going in and out of atrial fibrillation.  We will refer to our cardiology group to get input on optimizing him prior to surgery  PROCEDURE The patient has a port in place and a back rod implanted in his spine. He underwent an angiogram in the past but did not have any stents placed. He also had an echocardiogram in 10/2022.  No follow-ups on file.   Health Maintenance Due  Topic Date Due  . COPD Spirometry Testing  Never done  . Zoster Vaccine (1 of 2) Never done  . RSV Adult and Pregnancy (1 - 1-dose 75+ series) Never done  . COVID-19 Vaccine (5 - 2024-25 season) 09/29/2022  . Diabetes Foot Exam  10/18/2022  . Diabetes Annual Microalbumin/Creatinine Ratio   04/16/2023     Risks, benefits, and alternatives of the medications and treatment plan prescribed today were discussed, and patient expressed understanding. Plan follow-up as discussed or as needed if any worsening symptoms or change in condition.    A yearly preventative health exam was recommended and current age based recommendations were discussed.   Subjective   Patient ID:  Jeffery Fernandez is a 78 y.o. (DOB [birth year]) male    Patient presents with  . Pre-op Exam    PreCare Preop Checklist: History of Difficult IV access:no  Does the patient have a coronary stent no  History of difficult airway or positive predictors by physical exam: no Objection to blood transfusion: no  Is patient on a Blood thinner?: yes [id number] stopped 3/15 Pacemaker/AICD/bladder/bowel/spinal cord stimulator/LVAD/BIPAP: no  Chronic pain: yes - hydrocodone  5 mg daily  Surgical Procedure: Right Intertrochanteric Intramedullary Nail High risk-major emergency surgery, major vascular surgery, prolonged surgery with large fluid shifts.  Moderate risk-carotid endarterectomy, head and neck surgery, intraperitoneal or intrathoracic surgery, orthopedic surgery, prostate surgery.  Low risk-cataract removal, endoscopy, breast surgery, superficial procedures.  Cardiac risk index High risk surgery no  History of coronary artery disease yes  Congestive heart failure yes  Diagnosed 10/2022 with Normal EF.  Only on diuretic daily. History of cerebrovascular disease no Insulin  treatment for diabetes mellitus yes Preop serum creatinine greater than 2 yes  0 points equals class I (0.4% risk of complication) 1 Points equals class II (0.9% risk of complication)  2 points equals class III (6.6% risk of complication) 3 points equals class IV (11% risk of complication)  Active Cardiac Condition: no, and denies cardiac signs or symptoms METs equal to or more than 4: yes  Has the patient had recent cardiac testing: yes  Echo  11/25/22 with normal EF and no wall motion abnormalities Is a Cardiology consult or preoperative stress test necessary: yes  History of Present Illness The patient presents for a preoperative evaluation.  He is scheduled for an orthopedic surgery, specifically the insertion of an intramedullary rod, to stabilize his leg. He has no fractures. He has a port in place and has no cardiac stents. He is open to receiving a blood transfusion if necessary. He has a back rod implanted in his spine. He has a history of heart disease, as evidenced by a catheterization in 2017, and an episode of heart failure, which increases his risk of atrial fibrillation during surgery. He has no active cardiac conditions and is capable of ascending a flight of stairs without difficulty. He reports no chest pain during physical activity. He has recently resumed walking after a period of immobility that required the use of a wheelchair. He has not undergone any stress testing due to his inability to exercise. He has a DNR order in place. He has a large tumor in his right lung, measuring 7 cm. He reports no edema in his lower extremities. He has experienced a weight loss of 30 pounds. He had blood work done yesterday. He has a new tumor in his leg that has developed over the past 4 weeks, which is currently untreated as they are awaiting stabilization of his leg. His medical history includes an angiogram, but no stent placement. He experienced acute heart failure during a hospital admission for suspected pneumonia, which was attributed to pericardial effusion. He is not on any cardiac medications. He underwent an echocardiogram in 10/2022, which revealed a normal ejection fraction. He has no history of stroke. He is on daily insulin  therapy. His kidney function is currently better than it was previously. He has a rapid heartbeat, with rates often reaching 140 bpm, but these episodes typically resolve spontaneously. His resting heart rate  is usually between 112 and 120 bpm. He is not on any anticoagulants, having discontinued aspirin  81 mg approximately 10 days ago in anticipation of the surgery. He manages chronic pain with hydrocodone , taken as needed, but typically once daily. He is also on a diuretic, taking two doses in the morning.  MEDICATIONS Current: Hydrocodone , insulin . Discontinued: Aspirin  81 mg.    Outpatient Medications Marked as Taking for the 04/23/23 encounter (Office Visit) with Alm FORBES Bilis, MD  Medication Sig Dispense Refill  . atorvastatin  (LIPITOR) 40 mg tablet TAKE 1 TABLET EVERY DAY 90 tablet 3  . buPROPion  HCl (WELLBUTRIN  XL) 150 mg 24 hr tablet TAKE 1 TABLET BY MOUTH EVERY DAY IN THE MORNING 30 tablet 2  . Dulaglutide  (TRULICITY ) 1.5 MG/0.5ML SOAJ Inject 1.5 mg into the skin once a week. 6 mL 11  . gabapentin  (NEURONTIN ) 100 mg capsule TAKE ONE CAPSULE BY MOUTH 3 TIMES A DAY. (Patient taking differently: TAKE ONE CAPSULE BY MOUTH 3 TIMES A DAY.) 90 capsule 0  . glucose blood (BAYER CONTOUR NEXT TEST) test strip Use as instructed to check blood sugar three time(s) daily 100 each 12  . HYDROcodone -acetaminophen  (NORCO) 5-325 mg per tablet Take one tablet by mouth daily as needed for Pain. Max Daily Amount: 1 tablet 30 tablet  0  . Insulin  Glargine (LANTUS  SOLOSTAR) 100 UNIT/ML SOPN INJECT FORTY UNITS INTO THE SKIN DAILY. 45 each 1  . Insulin  Pen Needle (BD,SURE COMFORT,NOVOFINE) 32G X 4 MM MISC Use with insulin  pen as directed 100 each 1  . LORAzepam  (ATIVAN ) 0.5 mg tablet Take one tablet (0.5 mg dose) by mouth every 8 (eight) hours as needed.    . ondansetron  (ZOFRAN ) 4 mg tablet TAKE ONE TABLET BY MOUTH DAILY AS NEEDED FOR NAUSEA. 30 tablet 0  . prochlorperazine  (COMPAZINE ) 10 MG tablet Take one tablet (10 mg dose) by mouth every 6 (six) hours as needed.    . sertraline  (ZOLOFT ) 100 mg tablet Take one tablet (100 mg dose) by mouth daily. 90 tablet 3    Patient Care Team: Alm FORBES Bilis, MD as PCP -  General (Family Medicine) Reyes JONETTA Budge, MD (Neurosurgery) Tiffany Wyline Scarce, MD (Cardiology) Arley DELENA Ruder, MD (Ophthalmology) Hurman Reusing, MD (Ophthalmology) Victory LITTIE Legrand DOUGLAS, MD (Gastroenterology) Prentice VEAR Cheshire, MD as Consulting Physician (Nephrology) Social History   Tobacco Use  . Smoking status: Former    Current packs/day: 0.00    Average packs/day: 1.5 packs/day for 36.0 years (54.0 ttl pk-yrs)    Types: Cigarettes    Start date: 01/28/1950    Quit date: 01/28/1986    Years since quitting: 37.2    Passive exposure: Past  . Smokeless tobacco: Former  Substance Use Topics  . Alcohol use: No    Reviewed and updated this visit by provider: None       Review of Systems is complete and negative except as noted.  Objective   Vitals:   04/23/23 1614  BP: 130/82  Patient Position: Sitting  Pulse: 96  Temp: 98.3 F (36.8 C)  TempSrc: Temporal  Resp: 18  Height: 5' 9 (1.753 m)  Weight: 173 lb (78.5 kg)  SpO2: 98%  BMI (Calculated): 25.5   Wt Readings from Last 3 Encounters:  04/23/23 173 lb (78.5 kg)  01/24/23 186 lb 6.4 oz (84.6 kg)  12/11/22 188 lb (85.3 kg)      Constitutional: Well-developed and well-nourished.  Sitting comfortably conversing normally.  Eyes: Conjunctivae, lids, and EOM are normal. Pupils are equal, round, and reactive to light. Lymphatics: There is no anterior or posterior cervical adenopathy. Neck: Supple with normal range of motion. No thyroid  mass and no thyromegaly present. Cardiovascular: Regular rate and rhythm with normal heart sounds and no edema present. There are no carotid bruits. Respiratory: Decreased breath sounds in right lower lobe secondary to lung cancer Musculoskeletal: Gait is normal. Upper and Lower extremities are symmetrical with grossly normal muscle strength and tone with normal range of motion.  Skin: Skin is warm and dry. No rashes or bruising noted.  Psychiatric: Behavior is Cooperative and  Polite. Mood euthymyic. Affect is appropriate.    Patient's Medications       * Accurate as of April 23, 2023  5:05 PM. Reflects encounter med changes as of last refresh          Continued Medications      Instructions  atorvastatin  40 mg tablet Commonly known as: LIPITOR  TAKE 1 TABLET EVERY DAY   BAYER CONTOUR NEXT TEST test strip Generic drug: glucose blood  Use as instructed to check blood sugar three time(s) daily   buPROPion  HCl 150 mg 24 hr tablet Commonly known as: WELLBUTRIN  XL  150 mg, Oral, Every morning   busPIRone 10 mg tablet Commonly known as: BUSPAR  10 mg, Oral, 2 times a day as needed   gabapentin  100 mg capsule Commonly known as: NEURONTIN   100 mg, Oral, 3 times daily   HYDROcodone -acetaminophen  5-325 mg per tablet Commonly known as: NORCO  1 tablet, Oral, Daily as needed   Insulin  Pen Needle 32G X 4 MM Misc Commonly known as: BD,SURE COMFORT,NOVOFINE  Use with insulin  pen as directed   LANTUS  SOLOSTAR 100 UNIT/ML Sopn  40 Units, Subcutaneous, Daily   LORAzepam  0.5 mg tablet Commonly known as: ATIVAN   0.5 mg, Every 8 hours as needed   ondansetron  4 mg tablet Commonly known as: ZOFRAN   TAKE ONE TABLET BY MOUTH DAILY AS NEEDED FOR NAUSEA.   prochlorperazine  10 MG tablet Commonly known as: COMPAZINE   10 mg, Every 6 hours as needed   sertraline  100 mg tablet Commonly known as: ZOLOFT   100 mg, Oral, Daily   torsemide  20 mg tablet Commonly known as: DEMADEX   40 mg, Oral, Daily   TRULICITY  1.5 MG/0.5ML Soaj injection Generic drug: dulaglutide   1.5 mg, Subcutaneous, Weekly   VITAMIN D3 PO  50 mcg, Oral, Every morning        Orders Placed This Encounter  Procedures  . ECG 12 lead unit performed    Computer technology was used to create visit note. Consent from the patient/caregiver was obtained prior to its use.  Alm FORBES Bilis, MD  *Some images could not be shown.

## 2023-04-25 DIAGNOSIS — I447 Left bundle-branch block, unspecified: Secondary | ICD-10-CM | POA: Insufficient documentation

## 2023-04-28 ENCOUNTER — Encounter: Payer: Self-pay | Admitting: Internal Medicine

## 2023-04-28 ENCOUNTER — Telehealth: Payer: Self-pay

## 2023-04-28 NOTE — Telephone Encounter (Signed)
 Patient's daughter Aggie Cosier left a VM stating that patient has not been seen by orthopedic surgery yet because they want cardiac clearance first. Aggie Cosier would like to know if it's possible for patient to receive some palliative radiation to painful femur lesion in the interim.

## 2023-04-28 NOTE — Telephone Encounter (Signed)
 It looks like his stress test is scheduled for 04/30/2023. I have spoke to his surgeon and he is going to try to get the patient scheduled for surgery next week. Dr. Roselind Messier would prefer that the patient undergo surgery before radiation due to the risk of fracture. Could you please share this with the patient's daughter? Thanks!

## 2023-04-29 ENCOUNTER — Other Ambulatory Visit: Payer: HMO

## 2023-04-29 ENCOUNTER — Encounter: Payer: Self-pay | Admitting: Internal Medicine

## 2023-04-30 ENCOUNTER — Other Ambulatory Visit: Payer: Self-pay | Admitting: Internal Medicine

## 2023-04-30 DIAGNOSIS — C3490 Malignant neoplasm of unspecified part of unspecified bronchus or lung: Secondary | ICD-10-CM

## 2023-05-01 ENCOUNTER — Ambulatory Visit (HOSPITAL_COMMUNITY)
Admission: RE | Admit: 2023-05-01 | Discharge: 2023-05-01 | Disposition: A | Discharge status: Home / Self Care | Source: Ambulatory Visit | Attending: Internal Medicine | Admitting: Internal Medicine

## 2023-05-01 ENCOUNTER — Telehealth: Payer: Self-pay

## 2023-05-01 ENCOUNTER — Other Ambulatory Visit: Payer: Self-pay | Admitting: Internal Medicine

## 2023-05-01 DIAGNOSIS — C3490 Malignant neoplasm of unspecified part of unspecified bronchus or lung: Secondary | ICD-10-CM | POA: Diagnosis present

## 2023-05-01 MED ORDER — GADOBUTROL 1 MMOL/ML IV SOLN
8.0000 mL | Freq: Once | INTRAVENOUS | Status: AC | PRN
  Administered 2023-05-01: 8 mL via INTRAVENOUS

## 2023-05-01 NOTE — Telephone Encounter (Signed)
 Spoke with patients daughter Rosey Bath in regards to [employer]. Per Dr. Arbutus Ped- ordered STAT MRI of the brain.  Spoke with radiology department and scheduled MRI after CT scan today.  Rosey Bath aware and voiced thanks.

## 2023-05-02 ENCOUNTER — Other Ambulatory Visit (HOSPITAL_COMMUNITY)

## 2023-05-02 ENCOUNTER — Ambulatory Visit (HOSPITAL_COMMUNITY)

## 2023-05-05 ENCOUNTER — Encounter (HOSPITAL_COMMUNITY): Payer: Self-pay | Admitting: Orthopaedic Surgery

## 2023-05-05 ENCOUNTER — Other Ambulatory Visit: Payer: Self-pay

## 2023-05-05 NOTE — Progress Notes (Signed)
 Anesthesia Chart Review: SAME DAY WORK-UP  Case: [id number] Date/Time: 05/06/23 1545   Procedure: INSERTION, INTRAMEDULLARY ROD, FEMUR (Right) - RIGHT INTERTROCHANTERIC INTRAMEDULLARY HIP NAIL   Anesthesia type: General   Diagnosis: Bone lesion [M89.9]   Pre-op diagnosis: RIGHT METASTASIS OF FEMUR   [facility]: [facility] ROOM 04 / [facility]   Surgeons: Huel Cote, MD       DISCUSSION: Patient is a 78 year old male scheduled for the above procedure. He has stage IV non-small cell lung cancer diagnosed in 12/2022 and "presented with large right apical lung mass in addition to right hilar, mediastinal and supraclavicular lymphadenopathy as well as pulmonary nodules in the right upper lobe and right lower lobe with T10 bone metastasis in addition to medial left inguinal lymph node versus subcutaneous nodule." He had palliative radiotherapy to T10 bone metastasis. He started chemoimmunotherapy 02/18/23. 03/27/23 xray for bone pain showed a focal lytic lesion in the right femur midshaft.  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 Left internal jugular Port 02/19/23.   Last visit with Dr. Arbutus Ped noted was on 04/22/23. Creatinine 2.89 (known CKD). Received scheduled Nivolumab and Ipilimumab. He noted pending urgent orthopedic surgery for right femur metastasis. 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 Cardiac cath then showed normal right and left heart  filling pressures, 50-60% mid RCA stenosis, normal LVEF.He was followed by cardiologist Chilton Si, MD, but more recently at [facility] by Paris Lore, MD after it seems his PCP referred him for preoperative evaluation given resting HR ~ 110-120, but in setting of stage IV lung cancer and 30 lb weight loss. Echo and stress test ordered. 04/25/23 echo showed newly decreased LVEF to 40% (down from 55-60% 11/25/22), paradoxical septal motion noted and likely due to left bundle branch block, otherwise moderate global hypokinesis needed, mild to moderate concentric LVH, trace AR, RVSP normal at < 36 mmHg. Nuclear stress test on 04/30/23 showed LVEF 30%, diffuse hypokinesis, no evidence of inducible ischemia. Given the drop in LVEF and "brief palpitations" he ordered a 2 week cardiac monitor prior to surgery, but patient/daughter declined to do prior to surgery given oncologist had advised expedited surgery to prevent fracture.   He is a same day work-up. PAT RN preoperative phone interview still pending. RN to clarify last Trulicity dose. Anesthesia team to evaluate on the day of surgery. His Creatinine has been ~ 2.64 - 3.12 since 01/2023 and is followed by nephrology. The results of his recent chest/abd/pelvis CT are pending.   VS:  Wt Readings from Last 3 Encounters:  04/22/23 77.3 kg  04/01/23 76.2 kg  03/27/23 77.1 kg   BP Readings from Last 3 Encounters:  04/22/23 131/72  04/01/23 113/76  03/27/23 113/73   Pulse Readings from Last 3 Encounters:  04/22/23 97  04/01/23 99  03/27/23 (!) 104    PROVIDERS: [name], MD is PCP  Paris Lore, MD is cardiologist Si Gaul, MD is HEM-[facility] Antony Blackbird, MD is RAD[facility] is pulmonologist, last  visit with Levy Pupa, MD on 01/07/23.  Crista Elliot, MD is nephrologist Willette Alma, NP is Palliative Care provider   LABS: Most recent labs in [facility] include: Lab Results  Component  Value Date   WBC 8.1 04/22/2023   HGB 10.0 (L) 04/22/2023   HCT 30.8 (L) 04/22/2023   PLT 192 04/22/2023   GLUCOSE 202 (H) 04/22/2023   CHOL 127 06/08/2010   TRIG 122.0 06/08/2010   HDL 26.80 (L) 06/08/2010   LDLCALC 76 06/08/2010   ALT 9 04/22/2023   AST 12 (L) 04/22/2023   NA 136 04/22/2023   K 4.0 04/22/2023   CL 100 04/22/2023   CREATININE 2.89 (H) 04/22/2023   BUN 28 (H) 04/22/2023   CO2 28 04/22/2023   TSH 1.678 04/01/2023   INR 1.2 12/31/2022   HGBA1C 5.6 11/24/2022     IMAGES: MRI Brain 05/01/23: IMPRESSION: 1. No intracranial metastatic disease. 2. 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  CT Chest/abd/pelvis 05/01/23: Report in process.   MR Right Femur 04/02/23: IMPRESSION: Findings most worrisome for a metastatic lesion in the cortex of the mid diaphysis of the right femur as described above.   1V CXR 02/20/23: IMPRESSION: - Masslike opacity in the right upper lung extending to the hilum consistent with known diagnosis. - Chest port. - Enlarged heart.  PET Scan 12/16/22: IMPRESSION: 1. Right apical primary bronchogenic carcinoma with cervicothoracic nodal and osseous metastasis as detailed above. 2. Smaller hypermetabolic right upper lobe nodules could represent metachronous primaries or pulmonary metastasis. 3. Medial left inguinal node versus subcutaneous nodule is hypermetabolic and indeterminate for an atypical distribution of metastatic disease versus reactive etiology. 4. Incidental findings, including: Interstitial lung disease as detailed on dedicated chest CT. Coronary artery atherosclerosis. Aortic Atherosclerosis (ICD10-I70.0). Right nephrolithiasis.    EKG: EKG 12/31/22: ST at 108 bpm, LBBB (old).    CV: Nuclear stress test 04/30/23 ([facility]): IMPRESSION:  1. No evidence of inducible ischemia.  2. Moderate to severely reduced LVEF 30%. Diffuse hypokinesis.    Echo 04/25/23 ([facility]):   Left Ventricle: Systolic  function is moderately abnormal. EF: 40%.  Quantitative analysis of left ventricular Global Longitudinal Strain (GLS)  imaging is -8.900%, which is abnormal.    Left Ventricle: Paradoxical septal motion noted likely due to left  bundle branch block.  Otherwise moderate global hypokinesis noted.    Left Ventricle: Doppler parameters indicate normal diastolic function.    Left Ventricle: There is mild to moderate concentric hypertrophy.    lMitral Valve: Mitral valve structure is normal. The leaflets are mildly  thickened and exhibit normal excursion.    Aortic Valve: Trace aortic valve regurgitation.    Tricuspid Valve: The right ventricular systolic pressure is normal (<36  mmHg).  - Compared to report of previous study from October 2024 -LV systolic  function dropped from previous 60 to 65% down to 40%.  - Comparison TTE 11/25/22: LVEF 55-60%, no RWMA, mild concentric LVH, grade 1 DD, normal RV systolic function     Carotid U/S 04/11/16:  Impression: Less than 40% bilateral ICA stenosis.   Cardiac cath 06/11/10:  FINDINGS: 1. Hemodynamics.  Mean right atrial pressure 5 mmHg, RV 34/8, PA 34/12     with mean PA pressure 22 mmHg.  Mean pulmonary capillary wedge     pressure 4 mmHg, LV 160/19, aorta 151/72.  Aortic saturation 92%,     PA saturation 61%.  This gives [place] a cardiac output of 4 and a  cardiac index of 1.9.  However actually showed that the PA sat     completely accurate as the low cardiac output does not fit well     with the rest of the numbers from this case. 2. Left ventriculography.  EF was estimated to be 55%.  There were no     regional wall motion abnormalities. 3. Right coronary artery.  The right coronary artery was a dominant     vessel.  There is a 60% mid RCA stenosis followed shortly     thereafter by second 60-70% mid RCA stenosis. 4. Left main.  The left main had no angiographic coronary artery     disease. 5. LAD system.  The LAD system had luminal  irregularities, otherwise     no significant disease. 6. Left circumflex system.  The left circumflex system had mild     luminal irregularities only. 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  I really do not think that this explains the patient's quite significant dyspnea resulting in hospitalization in April.  I suspect that chronic obstructive pulmonary disease may end up being the main cause of his shortness of breath.  His right and left heart filling pressures were not elevated.  His LV systolic function is normal.  There is minimal disease in the circumflex and in the LAD.  I think continuing aggressive medical treatment for coronary artery disease is appropriate and he will, as planned, get PFTs and a sleep study.       Past Medical History:  Diagnosis Date   Arrhythmia    Arthritis    Cataract    bilateral cataract extraction with intraoccular lens implants   Chest pain, unspecified    Chronic airway obstruction, not elsewhere classified    PT. DENIES HE HAS COPD   CKD (chronic kidney disease)    Colon polyps    Adenomatous Polyps 2007   Depression    Diabetes mellitus    Diverticulosis    Gout    Heart murmur    History of kidney stones    History of radiation therapy    Thoracic Spine- 01/27/23-02/10/23- Dr. Antony Blackbird   Hyperlipemia    Hypertension    Lung cancer (HCC) 12/2022   Nephrolithiasis    Normocytic anemia 04/23/2017   Obstructive sleep apnea (adult) (pediatric)    Other and unspecified hyperlipidemia    Other left bundle branch block    Retinal vascular occlusion, unspecified    right   Shortness of breath    on exertion   Unspecified sleep apnea    Vision loss of right eye    White coat syndrome with diagnosis of hypertension 09/19/2017    Past Surgical History:  Procedure Laterality Date   ANTERIOR CERVICAL DECOMP/DISCECTOMY FUSION N/A 06/26/2016   Procedure: Re-Exploration of  Cervical Wound;  Surgeon: Donalee Citrin, MD;  Location: [facility] OR;  Service: Neurosurgery;  Laterality: N/A;   ANTERIOR CERVICAL DECOMP/DISCECTOMY FUSION N/A 06/26/2016   Procedure: ANTERIOR CERVICAL DECOMPRESSION/DISCECTOMY FUSION, INTERBODY PROSTESIS, PLATE, CERVICAL THREE CERVICAL FOUR, CERVICAL FOUR CERVICAL FIVE CERVICAL SIX;  Surgeon: Tressie Stalker, MD;  Location: [facility] OR;  Service: Neurosurgery;  Laterality: N/A;   APPLICATION OF WOUND VAC  06/05/2020   Procedure: APPLICATION OF WOUND VAC;  Surgeon: Tressie Stalker, MD;  Location: [facility] OR;  Service: Neurosurgery;;   BACK SURGERY     CATARACT EXTRACTION Bilateral    IR IMAGING GUIDED PORT INSERTION  02/19/2023   KIDNEY STONE SURGERY     KYPHOPLASTY N/A 01/19/2018   Procedure: KYPHOPLASTY LUMBAR THREE;  Surgeon: Tressie Stalker, MD;  Location: [facility] OR;  Service: Neurosurgery;  Laterality: N/A;  KYPHOPLASTY LUMBAR THREE   LUMBAR WOUND DEBRIDEMENT N/A 06/05/2020   Procedure: LUMBAR WOUND DEBRIDEMENT;  Surgeon: Tressie Stalker, MD;  Location: [facility] OR;  Service: Neurosurgery;  Laterality: N/A;    MEDICATIONS: No current facility-administered medications for this encounter.    acetaminophen (TYLENOL) 500 MG tablet   aspirin EC 81 MG tablet   atorvastatin (LIPITOR) 40 MG tablet   buPROPion (WELLBUTRIN XL) 150 MG 24 hr tablet   Dulaglutide (TRULICITY) 1.5 MG/0.5ML SOAJ   famotidine (PEPCID) 20 MG tablet   gabapentin (NEURONTIN) 100 MG capsule   HYDROcodone-acetaminophen (NORCO/VICODIN) 5-325 MG tablet   insulin glargine (LANTUS) 100 UNIT/ML injection   loratadine (CLARITIN) 10 MG tablet   LORazepam (ATIVAN) 0.5 MG tablet   ondansetron (ZOFRAN) 4 MG tablet   OXYGEN   prochlorperazine (COMPAZINE) 10 MG tablet   sertraline (ZOLOFT) 100 MG tablet   torsemide (DEMADEX) 20 MG tablet    Shonna Chock, PA-C Surgical Short Stay/Anesthesiology [facility] Phone [phone] [facility] Phone [phone] 05/05/2023 12:17 PM

## 2023-05-05 NOTE — Progress Notes (Signed)
 SDW CALL  Patient's daughter was given pre-op instructions over the phone. The opportunity was given for the patient's daughter to ask questions. No further questions asked. Patient's daughter verbalized understanding of instructions given.   PCP - Dr. [name] Cardiologist - Dr. Wille Glaser  PPM/ICD - denies Device Orders - n/a Rep Notified -  n/a  Chest x-ray - 02/20/23 EKG - 12/31/22 Stress Test - 04/30/23 ECHO - 04/25/23 Cardiac Cath -   Sleep Study - OSA+ but does not wear CPAP at night - will occasionally wear oxygen if needed but has not worn in several weeks  Fasting Blood Sugar - 70-80 Checks Blood Sugar __1___ times a day  Daughter informed that patient needs to check blood sugar at least every 2 hours on the day of surgery and that if patient were to have a reading less than 70, he would need to take glucose tablets, glucose gel, or 1/2 cup of apple juice and recheck blood sugar in 15 minutes.     Trulicity - last dose was 4/3  Blood Thinner Instructions: n/a Aspirin Instructions: n/a  ERAS Protcol - clears until 1300 PRE-SURGERY Ensure or G2-  n/a  COVID TEST- n/a   Anesthesia review: yes  Patient's daughter denies the patient has shortness of breath, fever, cough and chest pain over the phone call   All instructions explained to the patient's daughter, with a verbal understanding of the material. Patient's daughter agrees to go over the instructions while at home for a better understanding.

## 2023-05-05 NOTE — Anesthesia Preprocedure Evaluation (Signed)
 Anesthesia Evaluation  Patient identified by MRN, date of birth, ID band Patient awake    Reviewed: Allergy & Precautions, NPO status , Patient's Chart, lab work & pertinent test results  History of Anesthesia Complications Negative for: history of anesthetic complications  Airway Mallampati: II  TM Distance: >3 FB Neck ROM: Full    Dental  (+) Edentulous Upper, Missing,    Pulmonary sleep apnea , COPD, former smoker Metastatic lung ca   Pulmonary exam normal        Cardiovascular hypertension, + CAD and +CHF  Normal cardiovascular exam  Nuclear stress test 04/30/23 ([facility]): IMPRESSION:  1. No evidence of inducible ischemia.  2. Moderate to severely reduced LVEF 30%. Diffuse hypokinesis.    Echo 04/25/23 ([facility]):  LeftVentricle: Systolic function is moderately abnormal. EF: 40%.  Quantitative analysis of left ventricular Global Longitudinal Strain (GLS)  imaging is -8.900%, which is abnormal.   LeftVentricle: Paradoxical septal motion noted likely due to left  bundle branch block. Otherwise moderate global hypokinesis noted.   LeftVentricle: Doppler parameters indicate normal diastolic function.   LeftVentricle: There is mild to moderate concentric hypertrophy.   lMitralValve: Mitral valve structure is normal. The leaflets are mildly  thickened and exhibit normal excursion.   AorticValve: Trace aortic valve regurgitation.   TricuspidValve: The right ventricular systolic pressure is normal (<36  mmHg).  - Compared to report of previous study from October 2024 -LV systolic  function dropped from previous 60 to 65% down to 40%.  - Comparison TTE 11/25/22: LVEF 55-60%, no RWMA, mild concentric LVH, grade 1 DD, normal RV systolic function.    Neuro/Psych    Depression       GI/Hepatic ,GERD  Medicated,,  Endo/Other  diabetes, Type 2    Renal/GU Renal InsufficiencyRenal disease (Cr 2.89)      Musculoskeletal  (+) Arthritis ,    Abdominal   Peds  Hematology  (+) Blood dyscrasia, anemia   Anesthesia Other Findings   Reproductive/Obstetrics                             Anesthesia Physical Anesthesia Plan  ASA: 3  Anesthesia Plan: General   Post-op Pain Management: Ofirmev IV (intra-op)*   Induction: Intravenous  PONV Risk Score and Plan: 2 and Treatment may vary due to age or medical condition, Dexamethasone and Ondansetron  Airway Management Planned: Oral ETT  Additional Equipment: None  Intra-op Plan:   Post-operative Plan: Extubation in OR  Informed Consent: I have reviewed the patients History and Physical, chart, labs and discussed the procedure including the risks, benefits and alternatives for the proposed anesthesia with the patient or authorized representative who has indicated his/her understanding and acceptance.     Dental advisory given  Plan Discussed with: CRNA  Anesthesia Plan Comments: (PAT note written 05/05/2023 by Shonna Chock, PA-C.  )       Anesthesia Quick Evaluation

## 2023-05-06 ENCOUNTER — Other Ambulatory Visit: Payer: HMO

## 2023-05-06 ENCOUNTER — Inpatient Hospital Stay (HOSPITAL_COMMUNITY): Payer: Self-pay | Admitting: Vascular Surgery

## 2023-05-06 ENCOUNTER — Inpatient Hospital Stay (HOSPITAL_COMMUNITY)
Admission: RE | Admit: 2023-05-06 | Discharge: 2023-05-07 | DRG: 481 | Disposition: A | Discharge status: Home / Self Care | Attending: Orthopaedic Surgery | Admitting: Orthopaedic Surgery

## 2023-05-06 ENCOUNTER — Inpatient Hospital Stay (HOSPITAL_COMMUNITY)

## 2023-05-06 ENCOUNTER — Other Ambulatory Visit: Payer: Self-pay

## 2023-05-06 ENCOUNTER — Encounter (HOSPITAL_COMMUNITY)
Admission: RE | Disposition: A | Discharge status: Home / Self Care | Payer: Self-pay | Source: Home / Self Care | Attending: Orthopaedic Surgery

## 2023-05-06 ENCOUNTER — Encounter (HOSPITAL_COMMUNITY): Payer: Self-pay | Admitting: Orthopaedic Surgery

## 2023-05-06 DIAGNOSIS — I2510 Atherosclerotic heart disease of native coronary artery without angina pectoris: Secondary | ICD-10-CM

## 2023-05-06 DIAGNOSIS — Z87891 Personal history of nicotine dependence: Secondary | ICD-10-CM | POA: Diagnosis not present

## 2023-05-06 DIAGNOSIS — C7951 Secondary malignant neoplasm of bone: Secondary | ICD-10-CM | POA: Diagnosis not present

## 2023-05-06 DIAGNOSIS — G4733 Obstructive sleep apnea (adult) (pediatric): Secondary | ICD-10-CM | POA: Diagnosis present

## 2023-05-06 DIAGNOSIS — M899 Disorder of bone, unspecified: Secondary | ICD-10-CM | POA: Diagnosis not present

## 2023-05-06 DIAGNOSIS — E119 Type 2 diabetes mellitus without complications: Secondary | ICD-10-CM | POA: Diagnosis present

## 2023-05-06 DIAGNOSIS — Z880 Allergy status to penicillin: Secondary | ICD-10-CM

## 2023-05-06 DIAGNOSIS — Z981 Arthrodesis status: Secondary | ICD-10-CM

## 2023-05-06 DIAGNOSIS — Z8261 Family history of arthritis: Secondary | ICD-10-CM

## 2023-05-06 DIAGNOSIS — C801 Malignant (primary) neoplasm, unspecified: Secondary | ICD-10-CM | POA: Diagnosis not present

## 2023-05-06 DIAGNOSIS — Z833 Family history of diabetes mellitus: Secondary | ICD-10-CM

## 2023-05-06 DIAGNOSIS — Z79899 Other long term (current) drug therapy: Secondary | ICD-10-CM

## 2023-05-06 DIAGNOSIS — E785 Hyperlipidemia, unspecified: Secondary | ICD-10-CM | POA: Diagnosis present

## 2023-05-06 DIAGNOSIS — Z801 Family history of malignant neoplasm of trachea, bronchus and lung: Secondary | ICD-10-CM

## 2023-05-06 DIAGNOSIS — C3491 Malignant neoplasm of unspecified part of right bronchus or lung: Secondary | ICD-10-CM | POA: Diagnosis present

## 2023-05-06 DIAGNOSIS — F419 Anxiety disorder, unspecified: Secondary | ICD-10-CM | POA: Diagnosis present

## 2023-05-06 DIAGNOSIS — Z8249 Family history of ischemic heart disease and other diseases of the circulatory system: Secondary | ICD-10-CM

## 2023-05-06 DIAGNOSIS — Z7982 Long term (current) use of aspirin: Secondary | ICD-10-CM

## 2023-05-06 DIAGNOSIS — I10 Essential (primary) hypertension: Secondary | ICD-10-CM | POA: Diagnosis present

## 2023-05-06 DIAGNOSIS — Z823 Family history of stroke: Secondary | ICD-10-CM

## 2023-05-06 DIAGNOSIS — Z888 Allergy status to other drugs, medicaments and biological substances status: Secondary | ICD-10-CM

## 2023-05-06 DIAGNOSIS — M109 Gout, unspecified: Secondary | ICD-10-CM | POA: Diagnosis present

## 2023-05-06 DIAGNOSIS — Z860100 Personal history of colon polyps, unspecified: Secondary | ICD-10-CM

## 2023-05-06 DIAGNOSIS — I447 Left bundle-branch block, unspecified: Secondary | ICD-10-CM | POA: Diagnosis present

## 2023-05-06 DIAGNOSIS — Z820 Family history of epilepsy and other diseases of the nervous system: Secondary | ICD-10-CM

## 2023-05-06 DIAGNOSIS — F32A Depression, unspecified: Secondary | ICD-10-CM | POA: Diagnosis present

## 2023-05-06 DIAGNOSIS — J449 Chronic obstructive pulmonary disease, unspecified: Secondary | ICD-10-CM | POA: Diagnosis present

## 2023-05-06 DIAGNOSIS — Z923 Personal history of irradiation: Secondary | ICD-10-CM

## 2023-05-06 DIAGNOSIS — Z794 Long term (current) use of insulin: Secondary | ICD-10-CM

## 2023-05-06 DIAGNOSIS — Z7985 Long-term (current) use of injectable non-insulin antidiabetic drugs: Secondary | ICD-10-CM

## 2023-05-06 DIAGNOSIS — K219 Gastro-esophageal reflux disease without esophagitis: Secondary | ICD-10-CM | POA: Diagnosis present

## 2023-05-06 HISTORY — DX: Heart failure, unspecified: I50.9

## 2023-05-06 HISTORY — DX: Gastro-esophageal reflux disease without esophagitis: K21.9

## 2023-05-06 LAB — GLUCOSE, CAPILLARY
Glucose-Capillary: 70 mg/dL (ref 70–99)
Glucose-Capillary: 66 mg/dL — ABNORMAL LOW (ref 70–99)
Glucose-Capillary: 66 mg/dL — ABNORMAL LOW (ref 70–99)
Glucose-Capillary: 125 mg/dL — ABNORMAL HIGH (ref 70–99)
Glucose-Capillary: 122 mg/dL — ABNORMAL HIGH (ref 70–99)

## 2023-05-06 LAB — POCT I-STAT, CHEM 8
BUN: 23 mg/dL (ref 8–23)
Calcium, Ion: 1.1 mmol/L — ABNORMAL LOW (ref 1.15–1.40)
Chloride: 104 mmol/L (ref 98–111)
Creatinine, Ser: 2.9 mg/dL — ABNORMAL HIGH (ref 0.61–1.24)
Glucose, Bld: 68 mg/dL — ABNORMAL LOW (ref 70–99)
HCT: 33 % — ABNORMAL LOW (ref 39.0–52.0)
Hemoglobin: 11.2 g/dL — ABNORMAL LOW (ref 13.0–17.0)
Potassium: 3.4 mmol/L — ABNORMAL LOW (ref 3.5–5.1)
Sodium: 139 mmol/L (ref 135–145)
TCO2: 24 mmol/L (ref 22–32)

## 2023-05-06 SURGERY — INSERTION, INTRAMEDULLARY ROD, FEMUR
Anesthesia: General | Laterality: Right

## 2023-05-06 MED ORDER — PHENYLEPHRINE 80 MCG/ML (10ML) SYRINGE FOR IV PUSH (FOR BLOOD PRESSURE SUPPORT)
PREFILLED_SYRINGE | INTRAVENOUS | Status: DC | PRN
  Administered 2023-05-06 (×6): 80 ug via INTRAVENOUS

## 2023-05-06 MED ORDER — DOCUSATE SODIUM 100 MG PO CAPS
100.0000 mg | ORAL_CAPSULE | Freq: Two times a day (BID) | ORAL | Status: DC
  Administered 2023-05-06 – 2023-05-07 (×2): 100 mg via ORAL
  Filled 2023-05-06 (×2): qty 1

## 2023-05-06 MED ORDER — CHLORHEXIDINE GLUCONATE 0.12 % MT SOLN
OROMUCOSAL | Status: AC
  Administered 2023-05-06: 15 mL
  Filled 2023-05-06: qty 15

## 2023-05-06 MED ORDER — GABAPENTIN 300 MG PO CAPS
300.0000 mg | ORAL_CAPSULE | Freq: Once | ORAL | Status: DC
  Filled 2023-05-06: qty 1

## 2023-05-06 MED ORDER — OXYCODONE HCL 5 MG PO TABS
5.0000 mg | ORAL_TABLET | ORAL | Status: DC | PRN
  Administered 2023-05-06 – 2023-05-07 (×2): 5 mg via ORAL
  Filled 2023-05-06 (×2): qty 1

## 2023-05-06 MED ORDER — FENTANYL CITRATE (PF) 250 MCG/5ML IJ SOLN
INTRAMUSCULAR | Status: DC | PRN
Start: 2023-05-06 — End: 2023-05-06
  Administered 2023-05-06: 50 ug via INTRAVENOUS

## 2023-05-06 MED ORDER — PHENYLEPHRINE 80 MCG/ML (10ML) SYRINGE FOR IV PUSH (FOR BLOOD PRESSURE SUPPORT)
PREFILLED_SYRINGE | INTRAVENOUS | Status: AC
  Filled 2023-05-06: qty 10

## 2023-05-06 MED ORDER — GABAPENTIN 100 MG PO CAPS
100.0000 mg | ORAL_CAPSULE | Freq: Every day | ORAL | Status: DC
  Administered 2023-05-07: 100 mg via ORAL
  Filled 2023-05-06: qty 1

## 2023-05-06 MED ORDER — DROPERIDOL 2.5 MG/ML IJ SOLN
0.6250 mg | Freq: Once | INTRAMUSCULAR | Status: AC | PRN
  Administered 2023-05-06: 0.625 mg via INTRAVENOUS

## 2023-05-06 MED ORDER — ACETAMINOPHEN 500 MG PO TABS
1000.0000 mg | ORAL_TABLET | Freq: Four times a day (QID) | ORAL | Status: AC
  Administered 2023-05-06 – 2023-05-07 (×4): 1000 mg via ORAL
  Filled 2023-05-06 (×4): qty 2

## 2023-05-06 MED ORDER — DEXTROSE 50 % IV SOLN
25.0000 mL | Freq: Once | INTRAVENOUS | Status: AC
  Administered 2023-05-06: 25 mL via INTRAVENOUS

## 2023-05-06 MED ORDER — LORATADINE 10 MG PO TABS
10.0000 mg | ORAL_TABLET | Freq: Every day | ORAL | Status: DC
Start: 2023-05-07 — End: 2023-05-07
  Administered 2023-05-07: 10 mg via ORAL
  Filled 2023-05-06: qty 1

## 2023-05-06 MED ORDER — ONDANSETRON HCL 4 MG/2ML IJ SOLN
INTRAMUSCULAR | Status: AC
  Filled 2023-05-06: qty 2

## 2023-05-06 MED ORDER — BUPROPION HCL ER (XL) 150 MG PO TB24
150.0000 mg | ORAL_TABLET | Freq: Every morning | ORAL | Status: DC
  Administered 2023-05-07: 150 mg via ORAL
  Filled 2023-05-06: qty 1

## 2023-05-06 MED ORDER — OXYCODONE HCL 5 MG PO TABS: 10.0000 mg | ORAL_TABLET | ORAL | Status: DC | PRN

## 2023-05-06 MED ORDER — TRANEXAMIC ACID-NACL 1000-0.7 MG/100ML-% IV SOLN
1000.0000 mg | INTRAVENOUS | Status: AC
  Administered 2023-05-06: 1000 mg via INTRAVENOUS
  Filled 2023-05-06: qty 100

## 2023-05-06 MED ORDER — SUGAMMADEX SODIUM 200 MG/2ML IV SOLN
INTRAVENOUS | Status: DC | PRN
  Administered 2023-05-06: 131.6 mg via INTRAVENOUS

## 2023-05-06 MED ORDER — OXYCODONE HCL 5 MG PO TABS: 5.0000 mg | ORAL_TABLET | Freq: Once | ORAL | Status: DC | PRN

## 2023-05-06 MED ORDER — 0.9 % SODIUM CHLORIDE (POUR BTL) OPTIME
TOPICAL | Status: DC | PRN
  Administered 2023-05-06: 1000 mL

## 2023-05-06 MED ORDER — OXYCODONE HCL 5 MG/5ML PO SOLN: 5.0000 mg | Freq: Once | ORAL | Status: DC | PRN

## 2023-05-06 MED ORDER — LORAZEPAM 0.5 MG PO TABS: 0.5000 mg | ORAL_TABLET | Freq: Three times a day (TID) | ORAL | Status: DC | PRN

## 2023-05-06 MED ORDER — DULAGLUTIDE 1.5 MG/0.5ML ~~LOC~~ SOAJ: 1.5000 mg | Freq: Every day | SUBCUTANEOUS | Status: DC

## 2023-05-06 MED ORDER — ONDANSETRON HCL 4 MG PO TABS
4.0000 mg | ORAL_TABLET | Freq: Three times a day (TID) | ORAL | Status: DC | PRN
Start: 2023-05-06 — End: 2023-05-07

## 2023-05-06 MED ORDER — ACETAMINOPHEN 500 MG PO TABS
1000.0000 mg | ORAL_TABLET | Freq: Once | ORAL | Status: AC
  Administered 2023-05-06: 1000 mg via ORAL
  Filled 2023-05-06: qty 2

## 2023-05-06 MED ORDER — DROPERIDOL 2.5 MG/ML IJ SOLN
INTRAMUSCULAR | Status: AC
  Filled 2023-05-06: qty 2

## 2023-05-06 MED ORDER — HYDROMORPHONE HCL 1 MG/ML IJ SOLN: 0.5000 mg | INTRAMUSCULAR | Status: DC | PRN

## 2023-05-06 MED ORDER — ROCURONIUM BROMIDE 10 MG/ML (PF) SYRINGE
PREFILLED_SYRINGE | INTRAVENOUS | Status: AC
  Filled 2023-05-06: qty 10

## 2023-05-06 MED ORDER — ROCURONIUM BROMIDE 10 MG/ML (PF) SYRINGE
PREFILLED_SYRINGE | INTRAVENOUS | Status: DC | PRN
  Administered 2023-05-06: 10 mg via INTRAVENOUS
  Administered 2023-05-06: 40 mg via INTRAVENOUS

## 2023-05-06 MED ORDER — ATORVASTATIN CALCIUM 40 MG PO TABS
40.0000 mg | ORAL_TABLET | Freq: Every day | ORAL | Status: DC
  Administered 2023-05-06: 40 mg via ORAL
  Filled 2023-05-06: qty 1

## 2023-05-06 MED ORDER — LIDOCAINE 2% (20 MG/ML) 5 ML SYRINGE
INTRAMUSCULAR | Status: DC | PRN
  Administered 2023-05-06: 40 mg via INTRAVENOUS

## 2023-05-06 MED ORDER — CEFAZOLIN SODIUM-DEXTROSE 2-4 GM/100ML-% IV SOLN
2.0000 g | INTRAVENOUS | Status: AC
  Administered 2023-05-06: 2 g via INTRAVENOUS
  Filled 2023-05-06: qty 100

## 2023-05-06 MED ORDER — NOREPINEPHRINE 4 MG/250ML-% IV SOLN
INTRAVENOUS | Status: DC | PRN
  Administered 2023-05-06: 1 ug/min via INTRAVENOUS

## 2023-05-06 MED ORDER — INSULIN ASPART 100 UNIT/ML IJ SOLN
0.0000 [IU] | Freq: Three times a day (TID) | INTRAMUSCULAR | Status: DC
  Administered 2023-05-07: 2 [IU] via SUBCUTANEOUS

## 2023-05-06 MED ORDER — SODIUM CHLORIDE 0.9 % IV SOLN: INTRAVENOUS | Status: DC

## 2023-05-06 MED ORDER — ACETAMINOPHEN 325 MG PO TABS: 325.0000 mg | ORAL_TABLET | Freq: Four times a day (QID) | ORAL | Status: DC | PRN

## 2023-05-06 MED ORDER — DEXTROSE 50 % IV SOLN
INTRAVENOUS | Status: AC
  Filled 2023-05-06: qty 50

## 2023-05-06 MED ORDER — NOREPINEPHRINE 4 MG/250ML-% IV SOLN
INTRAVENOUS | Status: AC
  Filled 2023-05-06: qty 250

## 2023-05-06 MED ORDER — ONDANSETRON HCL 4 MG/2ML IJ SOLN
INTRAMUSCULAR | Status: DC | PRN
  Administered 2023-05-06: 4 mg via INTRAVENOUS

## 2023-05-06 MED ORDER — FENTANYL CITRATE (PF) 250 MCG/5ML IJ SOLN
INTRAMUSCULAR | Status: AC
Start: 2023-05-06 — End: ?
  Filled 2023-05-06: qty 5

## 2023-05-06 MED ORDER — FENTANYL CITRATE (PF) 100 MCG/2ML IJ SOLN
INTRAMUSCULAR | Status: AC
  Filled 2023-05-06: qty 2

## 2023-05-06 MED ORDER — SODIUM CHLORIDE 0.9 % IV SOLN: INTRAVENOUS | Status: DC | PRN

## 2023-05-06 MED ORDER — DEXTROSE 50 % IV SOLN
INTRAVENOUS | Status: DC | PRN
Start: 2023-05-06 — End: 2023-05-06
  Administered 2023-05-06: 25 mL via INTRAVENOUS

## 2023-05-06 MED ORDER — SERTRALINE HCL 100 MG PO TABS
100.0000 mg | ORAL_TABLET | Freq: Every day | ORAL | Status: DC
  Administered 2023-05-07: 100 mg via ORAL
  Filled 2023-05-06: qty 1

## 2023-05-06 MED ORDER — LIDOCAINE 2% (20 MG/ML) 5 ML SYRINGE
INTRAMUSCULAR | Status: AC
  Filled 2023-05-06: qty 5

## 2023-05-06 MED ORDER — INSULIN ASPART 100 UNIT/ML IJ SOLN: 0.0000 [IU] | Freq: Every day | INTRAMUSCULAR | Status: DC

## 2023-05-06 MED ORDER — FENTANYL CITRATE (PF) 100 MCG/2ML IJ SOLN
25.0000 ug | INTRAMUSCULAR | Status: DC | PRN
  Administered 2023-05-06: 25 ug via INTRAVENOUS

## 2023-05-06 MED ORDER — PROPOFOL 10 MG/ML IV BOLUS
INTRAVENOUS | Status: DC | PRN
  Administered 2023-05-06: 100 mg via INTRAVENOUS

## 2023-05-06 SURGICAL SUPPLY — 32 items
BAG COUNTER SPONGE SURGICOUNT (BAG) IMPLANT
BIT DRILL CROWE POINT TWST 4.3 (DRILL) IMPLANT
CHLORAPREP W/TINT 26 (MISCELLANEOUS) ×1 IMPLANT
COVER PERINEAL POST (MISCELLANEOUS) ×1 IMPLANT
COVER SURGICAL LIGHT HANDLE (MISCELLANEOUS) ×1 IMPLANT
DRAPE C-ARMOR (DRAPES) ×1 IMPLANT
DRAPE STERI IOBAN 125X83 (DRAPES) ×1 IMPLANT
DRAPE U-SHAPE 47X51 STRL (DRAPES) ×2 IMPLANT
DRESSING MEPILEX FLEX 4X4 (GAUZE/BANDAGES/DRESSINGS) ×2 IMPLANT
DRILL CROWE POINT TWIST 4.3 (DRILL) ×1 IMPLANT
DRSG MEPILEX FLEX 4X4 (GAUZE/BANDAGES/DRESSINGS) ×1 IMPLANT
DRSG MEPILEX POST OP 4X8 (GAUZE/BANDAGES/DRESSINGS) IMPLANT
ELECT REM PT RETURN 15FT ADLT (MISCELLANEOUS) ×1 IMPLANT
GAUZE XEROFORM 5X9 LF (GAUZE/BANDAGES/DRESSINGS) ×1 IMPLANT
GLOVE BIO SURGEON STRL SZ 6 (GLOVE) ×1 IMPLANT
GLOVE BIO SURGEON STRL SZ7.5 (GLOVE) ×1 IMPLANT
GLOVE BIOGEL PI IND STRL 6.5 (GLOVE) ×1 IMPLANT
GLOVE BIOGEL PI IND STRL 8 (GLOVE) ×1 IMPLANT
GOWN STRL REUS W/ TWL LRG LVL3 (GOWN DISPOSABLE) ×2 IMPLANT
GUIDEPIN VERSANAIL DSP 3.2X444 (ORTHOPEDIC DISPOSABLE SUPPLIES) IMPLANT
GUIDEWIRE BALL NOSE 100CM (WIRE) IMPLANT
KIT BASIN OR (CUSTOM PROCEDURE TRAY) ×1 IMPLANT
KIT TURNOVER KIT B (KITS) IMPLANT
NAIL IM ANG AFFIXUS 11X380 RT (Nail) IMPLANT
NS IRRIG 1000ML POUR BTL (IV SOLUTION) ×1 IMPLANT
PACK GENERAL/GYN (CUSTOM PROCEDURE TRAY) ×1 IMPLANT
SCREW BONE CORTICAL 5.0X44 (Screw) IMPLANT
SCREW LAG HIP NAIL 10.5X95 (Screw) IMPLANT
STAPLER VISISTAT 35W (STAPLE) ×1 IMPLANT
SUT VIC AB 0 CT1 36 (SUTURE) ×1 IMPLANT
SUT VIC AB 1 CT1 36 (SUTURE) ×1 IMPLANT
SUT VIC AB 2-0 CT1 TAPERPNT 27 (SUTURE) ×1 IMPLANT

## 2023-05-06 NOTE — Plan of Care (Signed)

## 2023-05-06 NOTE — Op Note (Signed)
 Date of Surgery: 05/06/2023  INDICATIONS: Mr. Gell is a 78 y.o.-year-old male with right femur metastatic lesion.  The risk and benefits of the procedure were discussed in detail and documented in the pre-operative evaluation.   PREOPERATIVE DIAGNOSIS: 1. Right hip femoral metastatic lesion  POSTOPERATIVE DIAGNOSIS: Same.  PROCEDURE: 1. Right hip cephulomedullary nailing  SURGEON: Benancio Deeds MD  ASSISTANT: Ardeen Fillers, ATC  ANESTHESIA:  general  IV FLUIDS AND URINE: See anesthesia record.  ANTIBIOTICS: Ancef  ESTIMATED BLOOD LOSS: 10 mL.  IMPLANTS:  Implant Name Type Inv. Item Serial No. Manufacturer Lot No. LRB No. Used Action  NAIL IM ANG AFFIXUS [id number] RT - [id number] Nail NAIL IM ANG AFFIXUS [id number] RT  [employer](ORTH,TRAU,[employer],SG) [id number] Right 1 Implanted  SCREW LAG HIP NAIL 10.5X95 - [id number] Screw SCREW LAG HIP NAIL 10.5X95  [employer](ORTH,TRAU,BIO,SG) [id number] Right 1 Implanted  SCREW BONE CORTICAL 5.0X44 - [id number] Screw SCREW BONE CORTICAL 5.0X44  [employer](ORTH,TRAU,BIO,SG) [id number] Right 1 Implanted    DRAINS: None  CULTURES: None  COMPLICATIONS: none  DESCRIPTION OF PROCEDURE:   The patient's chart/medical history was reviewed and decision was made to administer peri-operative trans-exemic acid.  The patient was identified in the preoperative holding area and the correct site was marked according universal protocol with nursing, and was subsequently taken back to the operating room.  Anesthesia was induced.  Antibiotics were given 1 hour prior to skin incision.  The patient was placed on the Hana table.  The traction post was placed.  The patient was subsequently prepped and draped in the usual sterile fashion.   Final timeout was performed.  A posterolateral approach to the greater trochanter was used.  This was done 3 cm proximal to the greater trochanter.  10 blade was used to incise through skin and IT band.  Blunt dissection was  performed down to the level of the greater trochanter.  The pin was placed under direct fluoroscopic visualization at the center of the greater trochanter at the tip.  This was malleted into place down to the level of the lesser trochanter.  Opening reamer was then used.  A ball-tipped guidewire was then place to distal metaphyseal bone in the femur.  This was passed across the fracture site.  The ball wire was then measured and the appropriate size nail size long nail in 11mm was taken.  Size 12.5 reamer was used over the guidewire.  Nail was introduced.  The cephalomedullary wire was placed.  Again measurement was taken after confirming center center on the AP lateral fluoroscopy.  The appropriate size screw was then placed into the neck component of the femur.  The screw was tightened and then backed off one quarter term to allow for compression.  Distal interlocks were then placed.  This was done during perfect circle technique.  15 blade was used to incise through skin and IT band.  Drill was then used bicortically.  Screw sizes were measured and then 1 screw was placed both in static fashion.  The jig was removed.  The wounds were thoroughly irrigated.  Final fluoroscopy was confirmed good reduction on AP and laterals.  Wounds were closed in layers of 0 Vicryl 2-0 Vicryl and staples.  An Aquacel dressing was placed. All counts were correct at the end of the case. The patient was awoken and taken to the PACU without complication.      POSTOPERATIVE PLAN: He will weight bearing and activity as tolerated. He will be seen by  PT while in the hospital. He may receive radiation when permitted.  Benancio Deeds, MD 4:14 PM

## 2023-05-06 NOTE — Anesthesia Procedure Notes (Signed)
 Procedure Name: Intubation Date/Time: 05/06/2023 3:31 PM  Performed by: Cy Blamer, CRNAPre-anesthesia Checklist: Patient identified, Emergency Drugs available, Suction available and Patient being monitored Patient Re-evaluated:Patient Re-evaluated prior to induction Oxygen Delivery Method: Circle system utilized Preoxygenation: Pre-oxygenation with 100% oxygen Induction Type: IV induction Ventilation: Mask ventilation without difficulty Laryngoscope Size: Miller and 2 Grade View: Grade I Tube type: Oral Tube size: 7.5 mm Number of attempts: 1 Airway Equipment and Method: Stylet and Bite block Placement Confirmation: ETT inserted through vocal cords under direct vision, positive ETCO2 and breath sounds checked- equal and bilateral Secured at: 21 cm Tube secured with: Tape Dental Injury: Teeth and Oropharynx as per pre-operative assessment

## 2023-05-06 NOTE — Interval H&P Note (Signed)
 History and Physical Interval Note:  05/06/2023 2:38 PM  Jeffery Fernandez  has presented today for surgery, with the diagnosis of RIGHT METASTASIS OF FEMUR.  The various methods of treatment have been discussed with the patient and family. After consideration of risks, benefits and other options for treatment, the patient has consented to  Procedure(s) with comments: INSERTION, INTRAMEDULLARY ROD, FEMUR (Right) - RIGHT INTERTROCHANTERIC INTRAMEDULLARY HIP NAIL as a surgical intervention.  The patient's history has been reviewed, patient examined, no change in status, stable for surgery.  I have reviewed the patient's chart and labs.  Questions were answered to the patient's satisfaction.     Huel Cote

## 2023-05-06 NOTE — Progress Notes (Signed)
 CBG 70 on arrival to short stay. Pt stated that CBG was 64 at 0700 this morning and took 5 glucose tablets. Dr. Stephannie Peters notified, will continue to monitor glucose. No correction of CBG at this time per Dr. Stephannie Peters. Verbal order for ISTAT received.   Michael Boston, RN

## 2023-05-06 NOTE — Transfer of Care (Signed)
 Immediate Anesthesia Transfer of Care Note  Patient: Jeffery Fernandez  Procedure(s) Performed: RIGHT FEMUR NAILING (Right)  Patient Location: PACU  Anesthesia Type:General  Level of Consciousness: awake, alert , and oriented  Airway & Oxygen Therapy: Patient Spontanous Breathing  Post-op Assessment: Report given to RN, Post -op Vital signs reviewed and stable, Patient moving all extremities X 4, and Patient able to stick tongue midline  Post vital signs: Reviewed and stable  Last Vitals:  Vitals Value Taken Time  BP 170/98 05/06/23 1639  Temp 97.0   Pulse 72 05/06/23 1643  Resp 15 05/06/23 1643  SpO2 92 % 05/06/23 1643  Vitals shown include unfiled device data.  Last Pain:  Vitals:   05/06/23 1245  TempSrc:   PainSc: 0-No pain         Complications: No notable events documented.

## 2023-05-06 NOTE — Discharge Instructions (Signed)
 Discharge Instructions    Attending Surgeon: Jeffery Cote, MD Office Phone Number: [phone]   Diagnosis and Procedures:    Surgeries Performed: Right hip cephulomedullary nailing  Discharge Plan:    Diet: Resume usual diet. Begin with light or bland foods.  Drink plenty of fluids.  Activity:  Weight bearing as tolerated right leg. You are advised to go home directly from the hospital or surgical center. Restrict your activities.  GENERAL INSTRUCTIONS: 1.  Please apply ice to your wound to help with swelling and inflammation. This will improve your comfort and your overall recovery following surgery.     2. Please call Dr. Serena Croissant office at [phone] with questions Monday-Friday during business hours. If no one answers, please leave a message and someone should get back to the patient within 24 hours. For emergencies please call 911 or proceed to the emergency room.   3. Patient to notify surgical team if experiences any of the following: Bowel/Bladder dysfunction, uncontrolled pain, nerve/muscle weakness, incision with increased drainage or redness, nausea/vomiting and Fever greater than 101.0 F.  Be alert for signs of infection including redness, streaking, odor, fever or chills. Be alert for excessive pain or bleeding and notify your surgeon immediately.  WOUND INSTRUCTIONS:   Leave your dressing, cast, or splint in place until your post operative visit.  Keep it clean and dry.  Always keep the incision clean and dry until the staples/sutures are removed. If there is no drainage from the incision you should keep it open to air. If there is drainage from the incision you must keep it covered at all times until the drainage stops  Do not soak in a bath tub, hot tub, pool, lake or other body of water until 21 days after your surgery and your incision is completely dry and healed.  If you have removable sutures (or staples) they must be removed 10-14 days (unless  otherwise instructed) from the day of your surgery.     1)  Elevate the extremity as much as possible.  2)  Keep the dressing clean and dry.  3)  Please call us if the dressing becomes wet or dirty.  4)  If you are experiencing worsening pain or worsening swelling, please call.     MEDICATIONS: Resume all previous home medications at the previous prescribed dose and frequency unless otherwise noted Start taking the  pain medications on an as-needed basis as prescribed  Please taper down pain medication over the next week following surgery.  Ideally you should not require a refill of any narcotic pain medication.  Take pain medication with food to minimize nausea. In addition to the prescribed pain medication, you may take over-the-counter pain relievers such as Tylenol.  Do NOT take additional tylenol if your pain medication already has tylenol in it.  Aspirin 325mg  daily per instructions on bottle. Narcotic policy: Per [facility] clinic policy, our goal is ensure optimal postoperative pain control with a multimodal pain management strategy. For all [facility] patients, our goal is to wean post-operative narcotic medications by 6 weeks post-operatively, and many times sooner. If this is not possible due to utilization of pain medication prior to surgery, your [facility] doctor will support your acute post-operative pain control for the first 6 weeks postoperatively, with a plan to transition you back to your primary pain team following that. Cyndia Skeeters will work to ensure a Therapist, occupational.       FOLLOWUP INSTRUCTIONS: 1. Follow up at the Physical Therapy  Clinic 3-4 days following surgery. This appointment should be scheduled unless other arrangements have been made.The Physical Therapy scheduling number is [phone] if an appointment has not already been arranged.  2. Contact Dr. Serena Croissant office during office hours at [phone] or the practice after hours line at [phone] for  non-emergencies. For medical emergencies call 911.   Discharge Location: Home

## 2023-05-06 NOTE — Brief Op Note (Signed)
   Brief Op Note  Date of Surgery: 05/06/2023  Preoperative Diagnosis: RIGHT METASTASIS OF FEMUR  Postoperative Diagnosis: same  Procedure: Procedure(s): RIGHT FEMUR NAILING  Implants: Implant Name Type Inv. Item Serial No. Manufacturer Lot No. LRB No. Used Action  NAIL IM ANG AFFIXUS [id number] RT - [id number] Nail NAIL IM ANG AFFIXUS [id number] RT  [employer](ORTH,TRAU,[employer],SG) [id number] Right 1 Implanted  SCREW LAG HIP NAIL 10.5X95 - [id number] Screw SCREW LAG HIP NAIL 10.5X95  [employer](ORTH,TRAU,BIO,SG) [id number] Right 1 Implanted  SCREW BONE CORTICAL 5.0X44 - [id number] Screw SCREW BONE CORTICAL 5.0X44  [employer](ORTH,TRAU,BIO,SG) [id number] Right 1 Implanted    Surgeons: Surgeon(s): Huel Cote, MD  Anesthesia: General    Estimated Blood Loss: See anesthesia record  Complications: None  Condition to PACU: Stable  Benancio Deeds, MD 05/06/2023 4:14 PM

## 2023-05-07 ENCOUNTER — Encounter (HOSPITAL_COMMUNITY): Payer: Self-pay | Admitting: Orthopaedic Surgery

## 2023-05-07 ENCOUNTER — Encounter (HOSPITAL_BASED_OUTPATIENT_CLINIC_OR_DEPARTMENT_OTHER): Payer: Self-pay | Admitting: Orthopaedic Surgery

## 2023-05-07 DIAGNOSIS — C3491 Malignant neoplasm of unspecified part of right bronchus or lung: Secondary | ICD-10-CM | POA: Diagnosis present

## 2023-05-07 DIAGNOSIS — Z87891 Personal history of nicotine dependence: Secondary | ICD-10-CM | POA: Diagnosis not present

## 2023-05-07 DIAGNOSIS — Z880 Allergy status to penicillin: Secondary | ICD-10-CM | POA: Diagnosis not present

## 2023-05-07 DIAGNOSIS — Z7985 Long-term (current) use of injectable non-insulin antidiabetic drugs: Secondary | ICD-10-CM | POA: Diagnosis not present

## 2023-05-07 DIAGNOSIS — E785 Hyperlipidemia, unspecified: Secondary | ICD-10-CM | POA: Diagnosis present

## 2023-05-07 DIAGNOSIS — J449 Chronic obstructive pulmonary disease, unspecified: Secondary | ICD-10-CM | POA: Diagnosis present

## 2023-05-07 DIAGNOSIS — Z860100 Personal history of colon polyps, unspecified: Secondary | ICD-10-CM | POA: Diagnosis not present

## 2023-05-07 DIAGNOSIS — Z79899 Other long term (current) drug therapy: Secondary | ICD-10-CM | POA: Diagnosis not present

## 2023-05-07 DIAGNOSIS — Z981 Arthrodesis status: Secondary | ICD-10-CM | POA: Diagnosis not present

## 2023-05-07 DIAGNOSIS — C7951 Secondary malignant neoplasm of bone: Secondary | ICD-10-CM | POA: Diagnosis present

## 2023-05-07 DIAGNOSIS — K219 Gastro-esophageal reflux disease without esophagitis: Secondary | ICD-10-CM | POA: Diagnosis present

## 2023-05-07 DIAGNOSIS — Z7982 Long term (current) use of aspirin: Secondary | ICD-10-CM | POA: Diagnosis not present

## 2023-05-07 DIAGNOSIS — Z888 Allergy status to other drugs, medicaments and biological substances status: Secondary | ICD-10-CM | POA: Diagnosis not present

## 2023-05-07 DIAGNOSIS — M109 Gout, unspecified: Secondary | ICD-10-CM | POA: Diagnosis present

## 2023-05-07 DIAGNOSIS — E119 Type 2 diabetes mellitus without complications: Secondary | ICD-10-CM | POA: Diagnosis present

## 2023-05-07 DIAGNOSIS — M899 Disorder of bone, unspecified: Secondary | ICD-10-CM | POA: Diagnosis present

## 2023-05-07 DIAGNOSIS — I10 Essential (primary) hypertension: Secondary | ICD-10-CM | POA: Diagnosis present

## 2023-05-07 DIAGNOSIS — F419 Anxiety disorder, unspecified: Secondary | ICD-10-CM | POA: Diagnosis present

## 2023-05-07 DIAGNOSIS — Z801 Family history of malignant neoplasm of trachea, bronchus and lung: Secondary | ICD-10-CM | POA: Diagnosis not present

## 2023-05-07 DIAGNOSIS — Z923 Personal history of irradiation: Secondary | ICD-10-CM | POA: Diagnosis not present

## 2023-05-07 DIAGNOSIS — I2510 Atherosclerotic heart disease of native coronary artery without angina pectoris: Secondary | ICD-10-CM | POA: Diagnosis present

## 2023-05-07 DIAGNOSIS — Z8249 Family history of ischemic heart disease and other diseases of the circulatory system: Secondary | ICD-10-CM | POA: Diagnosis not present

## 2023-05-07 DIAGNOSIS — F32A Depression, unspecified: Secondary | ICD-10-CM | POA: Diagnosis present

## 2023-05-07 DIAGNOSIS — G4733 Obstructive sleep apnea (adult) (pediatric): Secondary | ICD-10-CM | POA: Diagnosis present

## 2023-05-07 DIAGNOSIS — Z794 Long term (current) use of insulin: Secondary | ICD-10-CM | POA: Diagnosis not present

## 2023-05-07 LAB — BASIC METABOLIC PANEL WITH GFR
Anion gap: 8 (ref 5–15)
BUN: 21 mg/dL (ref 8–23)
CO2: 23 mmol/L (ref 22–32)
Calcium: 8.2 mg/dL — ABNORMAL LOW (ref 8.9–10.3)
Chloride: 106 mmol/L (ref 98–111)
Creatinine, Ser: 2.52 mg/dL — ABNORMAL HIGH (ref 0.61–1.24)
GFR, Estimated: 25 mL/min — ABNORMAL LOW (ref >60)
Glucose, Bld: 78 mg/dL (ref 70–99)
Potassium: 4.2 mmol/L (ref 3.5–5.1)
Sodium: 137 mmol/L (ref 135–145)

## 2023-05-07 LAB — GLUCOSE, CAPILLARY
Glucose-Capillary: 95 mg/dL (ref 70–99)
Glucose-Capillary: 41 mg/dL — CL (ref 70–99)
Glucose-Capillary: 73 mg/dL (ref 70–99)
Glucose-Capillary: 126 mg/dL — ABNORMAL HIGH (ref 70–99)

## 2023-05-07 MED ORDER — ASPIRIN 325 MG PO TBEC: 325.0000 mg | DELAYED_RELEASE_TABLET | Freq: Every day | ORAL | 0 refills | Status: DC

## 2023-05-07 MED ORDER — INSULIN ASPART 100 UNIT/ML IJ SOLN: 0.0000 [IU] | Freq: Three times a day (TID) | INTRAMUSCULAR | Status: DC

## 2023-05-07 MED ORDER — INSULIN ASPART 100 UNIT/ML IJ SOLN: 0.0000 [IU] | Freq: Every day | INTRAMUSCULAR | Status: DC

## 2023-05-07 MED ORDER — OXYCODONE HCL 5 MG PO TABS: 5.0000 mg | ORAL_TABLET | ORAL | 0 refills | Status: DC | PRN

## 2023-05-07 MED ORDER — IBUPROFEN 800 MG PO TABS
800.0000 mg | ORAL_TABLET | Freq: Three times a day (TID) | ORAL | 0 refills | Status: AC
Start: 2023-05-07 — End: 2023-05-17

## 2023-05-07 MED ORDER — ACETAMINOPHEN 500 MG PO TABS: 500.0000 mg | ORAL_TABLET | Freq: Three times a day (TID) | ORAL | 0 refills | Status: AC

## 2023-05-07 NOTE — Inpatient Diabetes Management (Signed)
 Inpatient Diabetes Program Recommendations  AACE/ADA: New Consensus Statement on Inpatient Glycemic Control (2015)  Target Ranges:  Prepandial:   less than 140 mg/dL      Peak postprandial:   less than 180 mg/dL (1-2 hours)      Critically ill patients:  140 - 180 mg/dL   Lab Results  Component Value Date   GLUCAP 126 (H) 05/07/2023   HGBA1C 5.6 11/24/2022    Latest Reference Range & Units 05/06/23 12:18 05/06/23 13:12 05/06/23 15:17 05/06/23 15:58 05/06/23 16:40 05/06/23 20:04 05/07/23 07:21 05/07/23 07:43 05/07/23 11:07  Glucose-Capillary 70 - 99 mg/dL 70 66 (L) 66 (L) 161 (H) 95 122 (H) 41 (LL) 73 126 (H)  (LL): Data is critically low (L): Data is abnormally low (H): Data is abnormally high  Diabetes history: DM2 Outpatient Diabetes medications: Lantus 41 units QAM, Trulicity 1.5 mg Qweek (Thursday  Current orders for Inpatient glycemic control: Trulicity 1.5 daily, Novolog 0-15 units tid, 0-5 units hs  Inpatient Diabetes Program Recommendations:   Patient had hypoglycemia without insulin given. Please consider: -Decrease Novolog correction to 0-6 units tid, 0-5 units hs -D/C Trulicity while in the hospital  Thank you, Jeffery Fernandez. Iman Orourke, RN, MSN, CDCES  Diabetes Coordinator Inpatient Glycemic Control Team Team Pager [phone] (8am-5pm) 05/07/2023 11:29 AM

## 2023-05-07 NOTE — Discharge Summary (Signed)
 Patient ID: Jeffery Fernandez MRN: [id number] DOB/AGE: Jun 17, 1945 78 y.o.  Admit date: 05/06/2023 Discharge date: 05/07/2023  Admission Diagnoses:  Lesion of femur  Discharge Diagnoses:  Principal Problem:   Lesion of femur   Past Medical History:  Diagnosis Date   Acid reflux    Arrhythmia    Arthritis    Cataract    bilateral cataract extraction with intraoccular lens implants   Chest pain, unspecified    CHF (congestive heart failure) (HCC)    Chronic airway obstruction, not elsewhere classified    PT. DENIES HE HAS COPD   CKD (chronic kidney disease)    Colon polyps    Adenomatous Polyps 2007   Depression    Diabetes mellitus    Diverticulosis    Gout    Heart murmur    History of kidney stones    History of radiation therapy    Thoracic Spine- 01/27/23-02/10/23- Dr. Antony Blackbird   Hyperlipemia    Hypertension    Lung cancer (HCC) 12/2022   Nephrolithiasis    Normocytic anemia 04/23/2017   Obstructive sleep apnea (adult) (pediatric)    Other and unspecified hyperlipidemia    Other left bundle branch block    Retinal vascular occlusion, unspecified    right   Shortness of breath    on exertion   Unspecified sleep apnea    Vision loss of right eye    White coat syndrome with diagnosis of hypertension 09/19/2017    Surgeries: Procedure(s): RIGHT FEMUR NAILING on 05/06/2023   Consultants (if any):   Discharged Condition: Improved  Hospital Course: Jeffery Fernandez is an 78 y.o. male who was admitted 05/06/2023 with a diagnosis of Lesion of femur and went to the operating room on 05/06/2023 and underwent the above named procedures.    He was given perioperative antibiotics:  Anti-infectives (From admission, onward)    Start     Dose/Rate Route Frequency Ordered Stop   05/07/23 0600  ceFAZolin (ANCEF) IVPB 2g/100 mL premix        2 g 200 mL/hr over 30 Minutes Intravenous On call to O.R. 05/06/23 1205 05/06/23 1603     .  He was given sequential compression  devices, early ambulation, and appropriate chemoprophylaxis for DVT prophylaxis.  He benefited maximally from the hospital stay and there were no complications.    Recent vital signs:  Vitals:   05/07/23 0414 05/07/23 1118  BP: (!) 146/79 (!) 159/79  Pulse: 80 (!) 102  Resp: 18 20  Temp: 98.5 F (36.9 C) 97.6 F (36.4 C)  SpO2: 93% 93%    Recent laboratory studies:  Lab Results  Component Value Date   HGB 11.2 (L) 05/06/2023   HGB 10.0 (L) 04/22/2023   HGB 9.7 (L) 04/01/2023   Lab Results  Component Value Date   WBC 8.1 04/22/2023   PLT 192 04/22/2023   Lab Results  Component Value Date   INR 1.2 12/31/2022   Lab Results  Component Value Date   NA 137 05/07/2023   K 4.2 05/07/2023   CL 106 05/07/2023   CO2 23 05/07/2023   BUN 21 05/07/2023   CREATININE 2.52 (H) 05/07/2023   GLUCOSE 78 05/07/2023    Discharge Medications:   Allergies as of 05/07/2023       Reactions   Amlodipine Swelling   Patient report facial swelling at high doses   Penicillins Other (See Comments)   05/06/23 tolerated cefazolin;   Hydralazine Other (See Comments)  Dizziness        Medication List     STOP taking these medications    HYDROcodone-acetaminophen 5-325 MG tablet Commonly known as: NORCO/VICODIN       TAKE these medications    acetaminophen 500 MG tablet Commonly known as: TYLENOL Take 1 tablet (500 mg total) by mouth every 8 (eight) hours for 10 days. What changed:  how much to take when to take this reasons to take this   aspirin EC 325 MG tablet Take 1 tablet (325 mg total) by mouth daily. What changed:  medication strength how much to take additional instructions   atorvastatin 40 MG tablet Commonly known as: LIPITOR Take 40 mg by mouth at bedtime.   buPROPion 150 MG 24 hr tablet Commonly known as: WELLBUTRIN XL Take 150 mg by mouth every morning.   famotidine 20 MG tablet Commonly known as: PEPCID Take 1 tablet (20 mg total) by mouth 2  (two) times daily. What changed: when to take this   gabapentin 100 MG capsule Commonly known as: NEURONTIN Take 1 capsule (100 mg total) by mouth daily.   ibuprofen 800 MG tablet Commonly known as: ADVIL Take 1 tablet (800 mg total) by mouth every 8 (eight) hours for 10 days. Please take with food, please alternate with acetaminophen   insulin glargine 100 UNIT/ML injection Commonly known as: LANTUS Inject 41 Units into the skin in the morning.   loratadine 10 MG tablet Commonly known as: CLARITIN Take 10 mg by mouth daily.   LORazepam 0.5 MG tablet Commonly known as: ATIVAN Take 1 tablet (0.5 mg total) by mouth every 8 (eight) hours as needed for anxiety. 0.5 mg p.o. every 8 hours as needed for anxiety/nausea.   ondansetron 4 MG tablet Commonly known as: Zofran Take 1 tablet (4 mg total) by mouth every 8 (eight) hours as needed for nausea or vomiting. What changed: when to take this   oxyCODONE 5 MG immediate release tablet Commonly known as: Roxicodone Take 1 tablet (5 mg total) by mouth every 4 (four) hours as needed for severe pain (pain score 7-10) or breakthrough pain.   OXYGEN Inhale 1.5 L into the lungs daily as needed (Shortness of breath).   prochlorperazine 10 MG tablet Commonly known as: COMPAZINE Take 1 tablet (10 mg total) by mouth every 6 (six) hours as needed for nausea or vomiting.   sertraline 100 MG tablet Commonly known as: ZOLOFT Take 100 mg by mouth in the morning.   torsemide 20 MG tablet Commonly known as: DEMADEX Take 2 tablets (40 mg total) by mouth daily.   Trulicity 1.5 MG/0.5ML Soaj Generic drug: Dulaglutide Inject 1.5 mg into the skin daily. Takes on Thursdays        Diagnostic Studies: DG FEMUR, MIN 2 VIEWS RIGHT Result Date: 05/07/2023 CLINICAL DATA:  Elective surgery. EXAM: RIGHT FEMUR 2 VIEWS COMPARISON:  Preoperative imaging FINDINGS: Seven fluoroscopic spot views of the right femur submitted from the operating room. Femoral  intramedullary nail with trans trochanteric and distal locking screw fixation. Fluoroscopy time 1 minutes 32 seconds. Dose 7.1 mGy. IMPRESSION: Intraoperative fluoroscopy during right femoral intramedullary nail fixation. Electronically Signed   By: Narda Rutherford M.D.   On: 05/07/2023 00:10   DG C-Arm 1-60 Min-No Report Result Date: 05/06/2023 Fluoroscopy was utilized by the requesting physician.  No radiographic interpretation.   MR BRAIN W WO CONTRAST Result Date: 05/01/2023 CLINICAL DATA:  Non-small cell lung carcinoma EXAM: MRI HEAD WITHOUT AND WITH CONTRAST TECHNIQUE:  Multiplanar, multiecho pulse sequences of the brain and surrounding structures were obtained without and with intravenous contrast. CONTRAST:  8mL GADAVIST GADOBUTROL 1 MMOL/ML IV SOLN COMPARISON:  04/10/2016 FINDINGS: Brain: No acute infarct, mass effect or extra-axial collection. No acute or chronic hemorrhage. There is confluent hyperintense T2-weighted signal within the white matter. Generalized volume loss. The midline structures are normal. Vascular: Normal flow voids. Skull and upper cervical spine: Normal calvarium and skull base. Visualized upper cervical spine and soft tissues are normal. Sinuses/Orbits:No paranasal sinus fluid levels or advanced mucosal thickening. No mastoid or middle ear effusion. Normal orbits. IMPRESSION: 1. No intracranial metastatic disease. 2. 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 Electronically Signed   By: Deatra Robinson M.D.   On: 05/01/2023 19:39    Disposition: Discharge disposition: 01-Home or Self Care          Follow-up Information     Huel Cote, MD Follow up.   Specialty: Orthopedic Surgery Contact information: [address] [place] [address] [phone]                  Signed: Huel Cote 05/07/2023, 11:59 AM

## 2023-05-07 NOTE — Progress Notes (Signed)
   Subjective:  Patient reports pain as mild. Pending PT. Tolerating diet.  Objective:   VITALS:   Vitals:   05/06/23 1715 05/06/23 1730 05/06/23 2002 05/07/23 0414  BP: (!) 159/80 (!) 150/81 108/72 (!) 146/79  Pulse: 65 72 93 80  Resp: 12 12 16 18   Temp:  97.6 F (36.4 C) 97.7 F (36.5 C) 98.5 F (36.9 C)  TempSrc:      SpO2: 93% 95% 93% 93%  Weight:      Height:       Dressing CDI. Fires EHL/TA/GS, 2+ dp  Lab Results  Component Value Date   WBC 8.1 04/22/2023   HGB 11.2 (L) 05/06/2023   HCT 33.0 (L) 05/06/2023   MCV 94.8 04/22/2023   PLT 192 04/22/2023     Assessment/Plan:  1 Day Post-Op status post right hip cephulomedullary nailing  - Expected postop acute blood loss anemia - will monitor for symptoms - Patient to work with PT to optimize mobilization safely - DVT ppx - SCDs, ambulation, Aspirin 325mg  - Postoperative Abx: Ancef x 2 additional doses given - WBAT operative extremity - Pain control - multimodal pain management, ATC acetaminophen in conjunction with as needed narcotic (oxycodone), although this should be minimized with other modalities  - Discharge planning pending CM, appreciate coordination    Aleyda Gindlesperger 05/07/2023, 8:28 AM

## 2023-05-07 NOTE — Progress Notes (Signed)
 Pt at bedrest. Able to turn and reposition himself. Medicated x1 for c/o rt hip with relief voiced. IVF's infusing; voiding clear yellow urine. Call light in reach. Sr x2 elevated. Bed in low position.

## 2023-05-07 NOTE — Evaluation (Signed)
 Physical Therapy Evaluation Patient Details Name: Jeffery Fernandez MRN: [id number] DOB: 1945-08-12 Today's Date: 05/07/2023  History of Present Illness  Patient is a 78 yo male presenting to the hospital for right femoral metastatic lesion on 05/06/23. R IM nail performed.  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   Clinical Impression  Jeffery Fernandez is 78 y.o. male admitted with above HPI and diagnosis. Patient is currently limited by functional impairments below (see PT problem list). Patient lives with family and is independent with no device and occasional use of rollator for household mobility at baseline with use of WC for [place] distances. Pt's daugther present for evaluation and agreeable to 24/7 assistance and comfortable with physical assistance required. Patient will benefit from continued skilled PT interventions to address impairments and progress independence with mobility, recommending HHPT. Acute PT will follow and progress as able.         If plan is discharge home, recommend the following: A little help with walking and/or transfers;A little help with bathing/dressing/bathroom;Assistance with cooking/housework;Assist for transportation;Help with stairs or ramp for entrance   Can travel by private vehicle        Equipment Recommendations None recommended by PT  Recommendations for Other Services       Functional Status Assessment Patient has had a recent decline in their functional status and demonstrates the ability to make significant improvements in function in a reasonable and predictable amount of time.     Precautions / Restrictions Precautions Precautions: Back;Fall Precaution/Restrictions Comments: Back precautions for comfort Restrictions Weight Bearing Restrictions Per Provider Order: Yes RLE Weight Bearing Per Provider Order: Weight bearing as tolerated      Mobility   Bed Mobility Overal bed mobility: Needs Assistance Bed Mobility: Rolling, Sidelying to Sit Rolling: Contact guard assist Sidelying to sit: Min assist       General bed mobility comments: Min A to come into sitting with back precautions as a safety measure to decrease overall pain, min A to bring trunk all the way upright, and to scoot incrementally to EOB    Transfers Overall transfer level: Needs assistance Equipment used: Rolling walker (2 wheels) Transfers: Sit to/from Stand Sit to Stand: Min assist           General transfer comment: Light min A to come fully up into standing, cues for hand placement    Ambulation/Gait Ambulation/Gait assistance: Min assist Gait Distance (Feet): 60 Feet Assistive device: Rolling walker (2 wheels) Gait Pattern/deviations: Step-to pattern, Decreased step length - right, Decreased stance time - left, Decreased weight shift to right Gait velocity: decr     General Gait Details: trunk flexed, pt advancing walker too far ahead of BOS, cues for proximity and managment. Min assist to steady. gait slightly antalgic and pt with increased pain as distance progressed.  Stairs            Wheelchair Mobility     Tilt Bed    Modified Rankin (Stroke Patients Only)       Balance Overall balance assessment: Needs assistance Sitting-balance support: Bilateral upper extremity supported, Feet supported Sitting balance-Leahy Scale: Fair     Standing balance support: Bilateral upper extremity supported, During functional activity, Reliant on assistive device for balance Standing balance-Leahy Scale: Poor Standing balance comment: reliant on RW  Pertinent Vitals/Pain Pain Assessment Pain Assessment: Faces Faces Pain Scale: Hurts little more Pain Location: Right leg Pain Descriptors / Indicators: Discomfort, Grimacing, Guarding Pain Intervention(s): Limited activity within patient's tolerance,  Repositioned, Monitored during session    Home Living Family/patient expects to be discharged to:: Private residence Living Arrangements: Spouse/significant other Available Help at Discharge: Family;Available 24 hours/day Type of Home: House Home Access: Ramped entrance       Home Layout: One level Home Equipment: Agricultural consultant (2 wheels);Cane - single point;Grab bars - toilet;Shower seat;Grab bars - tub/shower;Wheelchair - manual;BSC/3in1      Prior Function Prior Level of Function : Independent/Modified Independent;History of Falls (last six months)             Mobility Comments: Occasionally uses a cane, furniture surfs at times ADLs Comments: Independent with ADLs, daughter handles medications, and patient is able to complete finances     Extremity/Trunk Assessment   Upper Extremity Assessment Upper Extremity Assessment: Overall WFL for tasks assessed;Defer to OT evaluation    Lower Extremity Assessment Lower Extremity Assessment: Generalized weakness    Cervical / Trunk Assessment Cervical / Trunk Assessment: Normal  Communication   Communication Communication: No apparent difficulties    Cognition Arousal: Alert Behavior During Therapy: Impulsive, WFL for tasks assessed/performed   PT - Cognitive impairments: No apparent impairments                         Following commands: Impaired Following commands impaired: Only follows one step commands consistently, Follows multi-step commands inconsistently, Follows multi-step commands with increased time     Cueing Cueing Techniques: Verbal cues     General Comments      Exercises     Assessment/Plan    PT Assessment Patient needs continued PT services  PT Problem List Decreased strength;Decreased activity tolerance;Decreased balance;Decreased mobility;Decreased knowledge of use of DME;Decreased safety awareness;Decreased knowledge of precautions       PT Treatment Interventions DME  instruction;Gait training;Stair training;Functional mobility training;Therapeutic activities;Therapeutic exercise;Balance training;Neuromuscular re-education;Cognitive remediation;Patient/family education    PT Goals (Current goals can be found in the Care Plan section)  Acute Rehab PT Goals Patient Stated Goal: return home with family PT Goal Formulation: With patient Time For Goal Achievement: 05/21/23 Potential to Achieve Goals: Good    Frequency Min 2X/week     Co-evaluation               AM-PAC PT "6 Clicks" Mobility  Outcome Measure Help needed turning from your back to your side while in a flat bed without using bedrails?: A Little Help needed moving from lying on your back to sitting on the side of a flat bed without using bedrails?: A Little Help needed moving to and from a bed to a chair (including a wheelchair)?: A Little Help needed standing up from a chair using your arms (e.g., wheelchair or bedside chair)?: A Little Help needed to walk in hospital room?: A Little Help needed climbing 3-5 steps with a railing? : A Lot 6 Click Score: 17    End of Session Equipment Utilized During Treatment: Gait belt Activity Tolerance: Patient tolerated treatment well Patient left: in chair;with call bell/phone within reach;with family/visitor present Nurse Communication: Mobility status PT Visit Diagnosis: Unsteadiness on feet (R26.81);Other abnormalities of gait and mobility (R26.89);Muscle weakness (generalized) (M62.81);Difficulty in walking, not elsewhere classified (R26.2)    Time: 4098-1191 PT Time Calculation (min) (ACUTE ONLY): 23 min   Charges:   PT Evaluation $  PT Eval Moderate Complexity: 1 Mod   PT General Charges $$ ACUTE PT VISIT: 1 Visit         Wynn Maudlin, DPT Acute Rehabilitation Services Office [phone]  05/07/23 5:25 PM

## 2023-05-07 NOTE — Plan of Care (Signed)
 Pt at bedrest. Hob elevated. Skin warm and dry. IVF;s infusing w/o difficulty. Medicated for rt hip pain. Pt has urinated this shift. Discussed pm medications with the pt. He voiced understanding. No acute distress at present. Call light in reach. Sr x3 elevated. Bed in low position.

## 2023-05-07 NOTE — TOC Transition Note (Signed)
 Transition of Care [facility]) - Discharge Note   Patient Details  Name: Jeffery Fernandez MRN: [id number] Date of Birth: 07/03/1945  [facility] CM/SW Contact:  Harriet Masson, RN Phone Number: 05/07/2023, 3:17 PM   Clinical Narrative:    Patient stable for discharge.  Dr. Steward Drone doesn't think patient needs home health OT.  No other TOC needs.    Final next level of care: Home/Self Care Barriers to Discharge: Barriers Resolved   Patient Goals and CMS Choice Patient states their goals for this hospitalization and ongoing recovery are:: return home          Discharge Placement               Home        Discharge Plan and Services Additional resources added to the After Visit Summary for                                       Social Drivers of Health (SDOH) Interventions SDOH Screenings   Food Insecurity: No Food Insecurity (05/06/2023)  Housing: Unknown (05/06/2023)  Transportation Needs: No Transportation Needs (05/06/2023)  Utilities: Not At Risk (05/06/2023)  Depression (PHQ2-9): Medium Risk (01/09/2023)  Financial Resource Strain: Low Risk  (04/20/2023)   Received from [facility]  Physical Activity: Unknown (04/20/2023)   Received from [facility]  Social Connections: Patient Declined (05/06/2023)  Stress: Stress Concern Present (04/20/2023)   Received from [facility]  Tobacco Use: Medium Risk (05/06/2023)     Readmission Risk Interventions    01/02/2023   12:27 PM  Readmission Risk Prevention Plan  Transportation Screening Complete  PCP or Specialist Appt within 5-7 Days Complete  Home Care Screening Complete  Medication Review (RN CM) Complete

## 2023-05-07 NOTE — Evaluation (Signed)
 Occupational Therapy Evaluation Patient Details Name: Jeffery Fernandez MRN: [id number] DOB: [birth year] Today's Date: 05/07/2023   History of Present Illness   Patient is a 78 yo male presenting to the hospital for right femoral metastatic lesion on 05/06/23. R IM nail performed.  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     Clinical Impressions Prior to this admission, patient living at home with his spouse, with family close by to ensure 24/7 supervision. Patient frequently walks without DME, however will reach out for furniture and has had multiple near falls over the last three weeks. Currently, patient with pain in RLE, minimal confusion/delirium (daughter present and states this occurs every hospital stay) and need for min A for ADLs and functional mobility. Patient requiring increased cues for impulsivity, but would correct. OT recommending HHOT at discharge in order to work on functional deficits, and promoting 24/7 supervision over the next few days in order to ensure safety. Patient's daughter in agreement. OT wil continue to follow acutely.     If plan is discharge home, recommend the following:   A little help with walking and/or transfers;A little help with bathing/dressing/bathroom;Assistance with cooking/housework;Assist for transportation;Supervision due to cognitive status;Help with stairs or ramp for entrance;Direct supervision/assist for medications management;Direct supervision/assist for financial management (initially)     Functional Status Assessment   Patient has had a recent decline in their functional status and demonstrates the ability to make significant improvements in function in a reasonable and predictable amount of time.     Equipment Recommendations   None recommended by OT     Recommendations for Other Services         Precautions/Restrictions    Precautions Precautions: Back;Fall Precaution/Restrictions Comments: Back precautions for comfort Restrictions Weight Bearing Restrictions Per Provider Order: Yes RLE Weight Bearing Per Provider Order: Weight bearing as tolerated     Mobility Bed Mobility Overal bed mobility: Needs Assistance Bed Mobility: Rolling, Sidelying to Sit Rolling: Contact guard assist Sidelying to sit: Min assist       General bed mobility comments: Min A to come into sitting with back precautions as a safety measure to decrease overall pain, min A to bring trunk all the way upright, and to scoot incrementally to EOB    Transfers Overall transfer level: Needs assistance Equipment used: Rolling walker (2 wheels) Transfers: Sit to/from Stand Sit to Stand: Min assist           General transfer comment: Light min A to come fully up into standing, cues for hand placement      Balance Overall balance assessment: Needs assistance Sitting-balance support: Bilateral upper extremity supported, Feet supported Sitting balance-Leahy Scale: Fair     Standing balance support: Bilateral upper extremity supported, During functional activity, Reliant on assistive device for balance Standing balance-Leahy Scale: Poor Standing balance comment: reliant on RW                           ADL either performed or assessed with clinical judgement   ADL Overall ADL's : Needs assistance/impaired Eating/Feeding: Set up;Sitting   Grooming: Set up;Sitting   Upper Body Bathing: Contact guard assist;Sitting   Lower Body Bathing: Moderate assistance;Minimal assistance;Sit to/from stand;Sitting/lateral leans   Upper Body Dressing : Contact guard assist;Sitting   Lower Body Dressing: Moderate assistance;Minimal assistance;Sit to/from stand;Sitting/lateral leans Lower Body Dressing Details (indicate cue type and reason): to complete lower  body dressing safely Toilet Transfer: Minimal  assistance;Ambulation;Rolling walker (2 wheels) Toilet Transfer Details (indicate cue type and reason): simulated with RW Toileting- Clothing Manipulation and Hygiene: Contact guard assist;Sit to/from stand;Sitting/lateral lean       Functional mobility during ADLs: Minimal assistance;Rolling walker (2 wheels);Cueing for sequencing;Cueing for safety General ADL Comments: Prior to this admission, patient living at home with his spouse, with family close by to ensure 24/7 supervision. Patient frequently walks without DME, however will reach out for furniture and has had multiple near falls over the last three weeks. Currently, patient with pain in RLE, minimal confusion/delirium (daughter present and states this occurs every hospital stay) and need for min A for ADLs and functional mobility. Patient requiring increased cues for impulsivity, but would correct. OT recommending HHOT at discharge in order to work on functional deficits, and promoting 24/7 supervision over the next few days in order to ensure safety. Patient's daughter in agreement. OT wil continue to follow acutely.     Vision Baseline Vision/History: 1 Wears glasses Ability to See in Adequate Light: 0 Adequate Patient Visual Report: No change from baseline Vision Assessment?: No apparent visual deficits     Perception Perception: Not tested       Praxis Praxis: Not tested       Pertinent Vitals/Pain Pain Assessment Pain Assessment: Faces Faces Pain Scale: Hurts little more Pain Location: Right leg Pain Descriptors / Indicators: Discomfort, Grimacing, Guarding Pain Intervention(s): Limited activity within patient's tolerance, Monitored during session, Patient requesting pain meds-RN notified, Repositioned     Extremity/Trunk Assessment Upper Extremity Assessment Upper Extremity Assessment: Overall WFL for tasks assessed;Right hand dominant   Lower Extremity Assessment Lower Extremity Assessment: Defer to PT  evaluation   Cervical / Trunk Assessment Cervical / Trunk Assessment: Normal   Communication Communication Communication: No apparent difficulties   Cognition Arousal: Alert Behavior During Therapy: Impulsive, WFL for tasks assessed/performed Cognition: Cognition impaired     Awareness: Intellectual awareness intact, Online awareness impaired Memory impairment (select all impairments): Short-term memory Attention impairment (select first level of impairment): Sustained attention Executive functioning impairment (select all impairments): Sequencing, Problem solving OT - Cognition Comments: Per daughter, patient demonstrates some confusion/delirium in the hospital each time, family is well versed in what helps him                 Following commands: Impaired Following commands impaired: Only follows one step commands consistently, Follows multi-step commands inconsistently, Follows multi-step commands with increased time     Cueing  General Comments   Cueing Techniques: Verbal cues      Exercises     Shoulder Instructions      Home Living Family/patient expects to be discharged to:: Private residence Living Arrangements: Spouse/significant other Available Help at Discharge: Family;Available 24 hours/day Type of Home: House Home Access: Ramped entrance     Home Layout: One level     Bathroom Shower/Tub: Walk-in shower;Tub/shower unit   Bathroom Toilet: Handicapped height Bathroom Accessibility: Yes How Accessible: Accessible via walker Home Equipment: Rolling Walker (2 wheels);Cane - single point;Grab bars - toilet;Shower seat;Grab bars - tub/shower;Wheelchair - manual;BSC/3in1          Prior Functioning/Environment Prior Level of Function : Independent/Modified Independent;History of Falls (last six months)             Mobility Comments: Occasionally uses a cane, furniture surfs at times ADLs Comments: Independent with ADLs, daughter handles  medications, and patient is able to complete finances  OT Problem List: Decreased strength;Decreased range of motion;Decreased activity tolerance;Impaired balance (sitting and/or standing);Decreased coordination;Decreased cognition;Decreased safety awareness;Decreased knowledge of use of DME or AE;Pain   OT Treatment/Interventions: Self-care/ADL training;Therapeutic exercise;DME and/or AE instruction;Energy conservation;Manual therapy;Cognitive remediation/compensation;Patient/family education;Balance training;Therapeutic activities      OT Goals(Current goals can be found in the care plan section)   Acute Rehab OT Goals Patient Stated Goal: to go home OT Goal Formulation: With patient/family Time For Goal Achievement: 05/21/23 Potential to Achieve Goals: Good   OT Frequency:  Min 2X/week    Co-evaluation              AM-PAC OT "6 Clicks" Daily Activity     Outcome Measure Help from another person eating meals?: A Little Help from another person taking care of personal grooming?: A Little Help from another person toileting, which includes using toliet, bedpan, or urinal?: A Little Help from another person bathing (including washing, rinsing, drying)?: A Lot Help from another person to put on and taking off regular upper body clothing?: A Little Help from another person to put on and taking off regular lower body clothing?: A Lot 6 Click Score: 16   End of Session Equipment Utilized During Treatment: Gait belt;Rolling walker (2 wheels) Nurse Communication: Mobility status;Patient requests pain meds  Activity Tolerance: Patient tolerated treatment well Patient left: in chair;with call bell/phone within reach;with chair alarm set;with nursing/sitter in room;with family/visitor present  OT Visit Diagnosis: Unsteadiness on feet (R26.81);Other abnormalities of gait and mobility (R26.89);Repeated falls (R29.6);Muscle weakness (generalized) (M62.81);History of falling  (Z91.81);Other symptoms and signs involving cognitive function;Pain Pain - Right/Left: Right Pain - part of body: Leg                Time: 1050-1113 OT Time Calculation (min): 23 min Charges:  OT General Charges $OT Visit: 1 Visit OT Evaluation $OT Eval Moderate Complexity: 1 Mod  Pollyann Glen E. Tina Gruner, OTR/L Acute Rehabilitation Services [phone]   Cherlyn Cushing 05/07/2023, 1:00 PM

## 2023-05-07 NOTE — Anesthesia Postprocedure Evaluation (Signed)
 Anesthesia Post Note  Patient: Jeffery Fernandez  Procedure(s) Performed: RIGHT FEMUR NAILING (Right)     Patient location during evaluation: PACU Anesthesia Type: General Level of consciousness: awake and alert Pain management: pain level controlled Vital Signs Assessment: post-procedure vital signs reviewed and stable Respiratory status: spontaneous breathing, nonlabored ventilation, respiratory function stable and patient connected to nasal cannula oxygen Cardiovascular status: blood pressure returned to baseline and stable Postop Assessment: no apparent nausea or vomiting Anesthetic complications: no   No notable events documented.               Shelton Silvas

## 2023-05-09 ENCOUNTER — Telehealth: Payer: Self-pay | Admitting: Orthopaedic Surgery

## 2023-05-09 NOTE — Telephone Encounter (Signed)
 Verbal ok given.

## 2023-05-09 NOTE — Telephone Encounter (Signed)
 Jeffery Fernandez with [facility] called with verbal orders. Home health PT 1wk for 8wks. CB#[phone]

## 2023-05-11 NOTE — Progress Notes (Unsigned)
 [facility] OFFICE PROGRESS NOTE  Alfredia Ina, MD [address] [place] [address]  DIAGNOSIS: Stage IV (T2b, N3, M1b) non-small cell lung cancer, adenocarcinoma . This was diagnosed in December 2024 and presented with large right apical lung mass in addition to right hilar, mediastinal and supraclavicular lymphadenopathy as well as pulmonary nodules in the right upper lobe and right lower lobe with T10 bone metastasis in addition to medial left inguinal lymph node versus subcutaneous nodule.    Biomarker Findings HRD signature - HRDsig Negative Microsatellite status - Cannot Be Determined ? Tumor Mutational Burden - 8 Muts/Mb Genomic Findings For a complete list of the genes assayed, please refer to the Appendix. CDKN2A loss KEAP1 K73fs*60 STK11 E291* CDKN2B loss IKZF1 R111* TP53 R280K 8 Disease relevant genes with no reportable alterations: ALK, BRAF, EGFR, ERBB2, KRAS, MET, RET, ROS1  PRIOR THERAPY:  Palliative radiotherapy to the T10 bone metastasis.  Right femur nailing with Dr. Hermina Loosen on 05/06/23  CURRENT THERAPY: First-line treatment with chemoimmunotherapy in the form of carboplatin for AUC of 5 and paclitaxel 175 Mg/M2 on days 1 and 22 in addition to ipilimumab 1 Mg/KG on day 1 and nivolumab 360 Mg IV on days 1 and 22 for 1 cycle followed by maintenance treatment with ipilimumab 1 Mg/KG every 6 weeks and nivolumab 360 Mg IV every 3 weeks. First dose February 18, 2023. Chemotherapy was discontinued starting from day 22 of cycle #1 secondary to intolerance. Status post 2 cycles.   INTERVAL HISTORY: Taylon H Rigel 78 y.o. male returns to the clinic today for follow-up visit.  The patient was last seen by Dr. Marguerita Shih on 04/22/2023.  The patient is being treated for stage IV lung cancer.  He is currently on immunotherapy at this time.  In the interval since being seen he underwent orthopedic surgery for a metastatic lesion in his femur by Dr.  Hermina Loosen on 05/06/2023.  He tolerated it *** He is followed closely by palliative care for which he takes gabapentin 1 time daily, 1-1/2 tablets of hydrocodone every 4-6 hours.  Today he denies any fever, chills, or night sweats.  Appetite weight loss?  Cognitive changes?  He occasionally experiences cough with white phlegm without any hemoptysis or chest pain.  Shortness of breath?  He denies any nausea, vomiting, diarrhea, or constipation.  He follows closely with palliative care for which he is currently on***.  He recently had a restaging CT scan performed he is here today for evaluation and repeat blood work before undergoing cycle #3       MEDICAL HISTORY: Past Medical History:  Diagnosis Date   Acid reflux    Arrhythmia    Arthritis    Cataract    bilateral cataract extraction with intraoccular lens implants   Chest pain, unspecified    CHF (congestive heart failure) (HCC)    Chronic airway obstruction, not elsewhere classified    PT. DENIES HE HAS COPD   CKD (chronic kidney disease)    Colon polyps    Adenomatous Polyps 2007   Depression    Diabetes mellitus    Diverticulosis    Gout    Heart murmur    History of kidney stones    History of radiation therapy    Thoracic Spine- 01/27/23-02/10/23- Dr. Retta Caster   Hyperlipemia    Hypertension    Lung cancer (HCC) 12/2022   Nephrolithiasis    Normocytic anemia 04/23/2017   Obstructive sleep apnea (adult) (pediatric)  Other and unspecified hyperlipidemia    Other left bundle branch block    Retinal vascular occlusion, unspecified    right   Shortness of breath    on exertion   Unspecified sleep apnea    Vision loss of right eye    White coat syndrome with diagnosis of hypertension 09/19/2017    ALLERGIES:  is allergic to amlodipine, penicillins, and hydralazine.  MEDICATIONS:  Current Outpatient Medications  Medication Sig Dispense Refill   acetaminophen (TYLENOL) 500 MG tablet Take 1 tablet (500 mg total)  by mouth every 8 (eight) hours for 10 days. 30 tablet 0   aspirin EC 325 MG tablet Take 1 tablet (325 mg total) by mouth daily. 30 tablet 0   atorvastatin (LIPITOR) 40 MG tablet Take 40 mg by mouth at bedtime.     buPROPion (WELLBUTRIN XL) 150 MG 24 hr tablet Take 150 mg by mouth every morning.     Dulaglutide (TRULICITY) 1.5 MG/0.5ML SOAJ Inject 1.5 mg into the skin daily. Takes on Thursdays     famotidine (PEPCID) 20 MG tablet Take 1 tablet (20 mg total) by mouth 2 (two) times daily. (Patient taking differently: Take 20 mg by mouth daily.) 60 tablet 3   gabapentin (NEURONTIN) 100 MG capsule Take 1 capsule (100 mg total) by mouth daily.     ibuprofen (ADVIL) 800 MG tablet Take 1 tablet (800 mg total) by mouth every 8 (eight) hours for 10 days. Please take with food, please alternate with acetaminophen 30 tablet 0   insulin glargine (LANTUS) 100 UNIT/ML injection Inject 41 Units into the skin in the morning.     loratadine (CLARITIN) 10 MG tablet Take 10 mg by mouth daily.     LORazepam (ATIVAN) 0.5 MG tablet Take 1 tablet (0.5 mg total) by mouth every 8 (eight) hours as needed for anxiety. 0.5 mg p.o. every 8 hours as needed for anxiety/nausea. 30 tablet 0   ondansetron (ZOFRAN) 4 MG tablet Take 1 tablet (4 mg total) by mouth every 8 (eight) hours as needed for nausea or vomiting. (Patient taking differently: Take 4 mg by mouth daily.) 30 tablet 2   oxyCODONE (ROXICODONE) 5 MG immediate release tablet Take 1 tablet (5 mg total) by mouth every 4 (four) hours as needed for severe pain (pain score 7-10) or breakthrough pain. 30 tablet 0   OXYGEN Inhale 1.5 L into the lungs daily as needed (Shortness of breath).     prochlorperazine (COMPAZINE) 10 MG tablet Take 1 tablet (10 mg total) by mouth every 6 (six) hours as needed for nausea or vomiting. 30 tablet 1   sertraline (ZOLOFT) 100 MG tablet Take 100 mg by mouth in the morning.     torsemide (DEMADEX) 20 MG tablet Take 2 tablets (40 mg total) by mouth  daily. (Patient not taking: Reported on 03/27/2023) 60 tablet 0   No current facility-administered medications for this visit.    SURGICAL HISTORY:  Past Surgical History:  Procedure Laterality Date   ANTERIOR CERVICAL DECOMP/DISCECTOMY FUSION N/A 06/26/2016   Procedure: Re-Exploration of Cervical Wound;  Surgeon: Gearl Keens, MD;  Location: [facility] OR;  Service: Neurosurgery;  Laterality: N/A;   ANTERIOR CERVICAL DECOMP/DISCECTOMY FUSION N/A 06/26/2016   Procedure: ANTERIOR CERVICAL DECOMPRESSION/DISCECTOMY FUSION, INTERBODY PROSTESIS, PLATE, CERVICAL THREE CERVICAL FOUR, CERVICAL FOUR CERVICAL FIVE CERVICAL SIX;  Surgeon: Garry Kansas, MD;  Location[facility] OR;  Service: Neurosurgery;  Laterality: N/A;   APPLICATION OF WOUND VAC  06/05/2020   Procedure: APPLICATION OF WOUND  VAC;  Surgeon: Garry Kansas, MD;  Location: [facility] OR;  Service: Neurosurgery;;   BACK SURGERY     CATARACT EXTRACTION Bilateral    FEMUR IM NAIL Right 05/06/2023   Procedure: RIGHT FEMUR NAILING;  Surgeon: Wilhelmenia Harada, MD;  Location: [facility] OR;  Service: Orthopedics;  Laterality: Right;  RIGHT INTERTROCHANTERIC INTRAMEDULLARY HIP NAIL   IR IMAGING GUIDED PORT INSERTION  02/19/2023   KIDNEY STONE SURGERY     KYPHOPLASTY N/A 01/19/2018   Procedure: KYPHOPLASTY LUMBAR THREE;  Surgeon: Garry Kansas, MD;  Location: [facility] OR;  Service: Neurosurgery;  Laterality: N/A;  KYPHOPLASTY LUMBAR THREE   LUMBAR WOUND DEBRIDEMENT N/A 06/05/2020   Procedure: LUMBAR WOUND DEBRIDEMENT;  Surgeon: Garry Kansas, MD;  Location: [facility] OR;  Service: Neurosurgery;  Laterality: N/A;    REVIEW OF SYSTEMS:   Review of Systems  Constitutional: Negative for appetite change, chills, fatigue, fever and unexpected weight change.  HENT:   Negative for mouth sores, nosebleeds, sore throat and trouble swallowing.   Eyes: Negative for eye problems and icterus.  Respiratory: Negative for cough, hemoptysis, shortness of breath and wheezing.   Cardiovascular:  Negative for chest pain and leg swelling.  Gastrointestinal: Negative for abdominal pain, constipation, diarrhea, nausea and vomiting.  Genitourinary: Negative for bladder incontinence, difficulty urinating, dysuria, frequency and hematuria.   Musculoskeletal: Negative for back pain, gait problem, neck pain and neck stiffness.  Skin: Negative for itching and rash.  Neurological: Negative for dizziness, extremity weakness, gait problem, headaches, light-headedness and seizures.  Hematological: Negative for adenopathy. Does not bruise/bleed easily.  Psychiatric/Behavioral: Negative for confusion, depression and sleep disturbance. The patient is not nervous/anxious.     PHYSICAL EXAMINATION:  There were no vitals taken for this visit.  ECOG PERFORMANCE STATUS: {CHL ONC ECOG [id number]  Physical Exam  Constitutional: Oriented to person, place, and time and well-developed, well-nourished, and in no distress. No distress.  HENT:  Head: Normocephalic and atraumatic.  Mouth/Throat: Oropharynx is clear and moist. No oropharyngeal exudate.  Eyes: Conjunctivae are normal. Right eye exhibits no discharge. Left eye exhibits no discharge. No scleral icterus.  Neck: Normal range of motion. Neck supple.  Cardiovascular: Normal rate, regular rhythm, normal heart sounds and intact distal pulses.   Pulmonary/Chest: Effort normal and breath sounds normal. No respiratory distress. No wheezes. No rales.  Abdominal: Soft. Bowel sounds are normal. Exhibits no distension and no mass. There is no tenderness.  Musculoskeletal: Normal range of motion. Exhibits no edema.  Lymphadenopathy:    No cervical adenopathy.  Neurological: Alert and oriented to person, place, and time. Exhibits normal muscle tone. Gait normal. Coordination normal.  Skin: Skin is warm and dry. No rash noted. Not diaphoretic. No erythema. No pallor.  Psychiatric: Mood, memory and judgment normal.  Vitals reviewed.  LABORATORY DATA: Lab  Results  Component Value Date   WBC 8.1 04/22/2023   HGB 11.2 (L) 05/06/2023   HCT 33.0 (L) 05/06/2023   MCV 94.8 04/22/2023   PLT 192 04/22/2023      Chemistry      Component Value Date/Time   NA 137 05/07/2023 0449   NA 142 07/03/2022 1356   K 4.2 05/07/2023 0449   CL 106 05/07/2023 0449   CO2 23 05/07/2023 0449   BUN 21 05/07/2023 0449   BUN 22 07/03/2022 1356   CREATININE 2.52 (H) 05/07/2023 0449   CREATININE 2.89 (H) 04/22/2023 1117      Component Value Date/Time   CALCIUM 8.2 (L) 05/07/2023 0449   ALKPHOS  119 04/22/2023 1117   AST 12 (L) 04/22/2023 1117   ALT 9 04/22/2023 1117   BILITOT 0.4 04/22/2023 1117       RADIOGRAPHIC STUDIES:  DG FEMUR, MIN 2 VIEWS RIGHT Result Date: 05/07/2023 CLINICAL DATA:  Elective surgery. EXAM: RIGHT FEMUR 2 VIEWS COMPARISON:  Preoperative imaging FINDINGS: Seven fluoroscopic spot views of the right femur submitted from the operating room. Femoral intramedullary nail with trans trochanteric and distal locking screw fixation. Fluoroscopy time 1 minutes 32 seconds. Dose 7.1 mGy. IMPRESSION: Intraoperative fluoroscopy during right femoral intramedullary nail fixation. Electronically Signed   By: Chadwick Colonel M.D.   On: 05/07/2023 00:10   DG C-Arm 1-60 Min-No Report Result Date: 05/06/2023 Fluoroscopy was utilized by the requesting physician.  No radiographic interpretation.   MR BRAIN W WO CONTRAST Result Date: 05/01/2023 CLINICAL DATA:  Non-small cell lung carcinoma EXAM: MRI HEAD WITHOUT AND WITH CONTRAST TECHNIQUE: Multiplanar, multiecho pulse sequences of the brain and surrounding structures were obtained without and with intravenous contrast. CONTRAST:  8mL GADAVIST GADOBUTROL 1 MMOL/ML IV SOLN COMPARISON:  04/10/2016 FINDINGS: Brain: No acute infarct, mass effect or extra-axial collection. No acute or chronic hemorrhage. There is confluent hyperintense T2-weighted signal within the white matter. Generalized volume loss. The midline  structures are normal. Vascular: Normal flow voids. Skull and upper cervical spine: Normal calvarium and skull base. Visualized upper cervical spine and soft tissues are normal. Sinuses/Orbits:No paranasal sinus fluid levels or advanced mucosal thickening. No mastoid or middle ear effusion. Normal orbits. IMPRESSION: 1. No intracranial metastatic disease. 2. 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 Electronically Signed   By: Juanetta Nordmann M.D.   On: 05/01/2023 19:39     ASSESSMENT/PLAN:  This is a very pleasant 78 year old Caucasian male diagnosed with stage IV (T2b, N3, M1 B) non-small cell lung cancer, adenocarcinoma.  He was diagnosed in December 2024.  He presented with a large right apical lung mass in addition to right hilar, mediastinal, and supraclavicular lymphadenopathy as well as pulmonary nodules, T10 metastatic bone lesion in addition to a subcutaneous nodule.  He has no actionable mutation and there was insufficient material for PD-L1 expression.  He completed palliative radiation to the T10 vertebral body lesion.  For systemic treatment he is currently on first-line chemoimmunotherapy with carboplatin for an AUC of 5 and paclitaxel 175 mg/m on days 1 and 22 in addition to ipilimumab 1 mg/kg on day 1 and nivolumab 360 mg on days 1 and 22.  This will be followed by maintenance treatment with ipilimumab every 6 weeks and nivolumab every 3 weeks.  His first dose was on 02/18/2023.  He is status post***cycles of chemotherapy.  He had a challenging time with his first cycle of treatment so starting from day 22 cycle 1 his chemotherapy was discontinued.  In the interval since being seen the patient underwent orthopedic surgery to the femur with right femur nailing on 05/06/2023 which he tolerated ***  Labs were reviewed.  Recommend that he***with cycle number***today as scheduled.  The patient was seen with Dr. Marguerita Shih today.  Dr. Marguerita Shih personally and independently  reviewed the scan and discussed results with the patient today.  The scan showed ***.  Dr. Marguerita Shih recommends ***  We will see the patient back for follow-up visit in 3 weeks for evaluation repeat blood work before undergoing the next cycle of treatment.  He will continue to follow with palliative care for his cancer related pain.  The patient was advised to  call immediately if she has any concerning symptoms in the interval. The patient voices understanding of current disease status and treatment options and is in agreement with the current care plan. All questions were answered. The patient knows to call the clinic with any problems, questions or concerns. We can certainly see the patient much sooner if necessary   No orders of the defined types were placed in this encounter.    I spent {CHL ONC TIME VISIT - ZOXWR:[id number]} counseling the patient face to face. The total time spent in the appointment was {CHL ONC TIME VISIT - BJYNW:[id number]}.  Joscelyne Renville L Tishawna Larouche, PA-C 05/11/23

## 2023-05-13 ENCOUNTER — Inpatient Hospital Stay: Payer: HMO | Attending: Nurse Practitioner | Admitting: Physician Assistant

## 2023-05-13 ENCOUNTER — Inpatient Hospital Stay: Payer: HMO

## 2023-05-13 ENCOUNTER — Inpatient Hospital Stay (HOSPITAL_BASED_OUTPATIENT_CLINIC_OR_DEPARTMENT_OTHER): Admitting: Nurse Practitioner

## 2023-05-13 ENCOUNTER — Encounter: Payer: Self-pay | Admitting: Nurse Practitioner

## 2023-05-13 ENCOUNTER — Inpatient Hospital Stay: Payer: HMO | Attending: Nurse Practitioner

## 2023-05-13 VITALS — BP 123/68 | HR 103 | Temp 97.6°F | Resp 18 | Ht 70.0 in | Wt 173.1 lb

## 2023-05-13 VITALS — HR 98

## 2023-05-13 DIAGNOSIS — Z515 Encounter for palliative care: Secondary | ICD-10-CM | POA: Diagnosis not present

## 2023-05-13 DIAGNOSIS — C3411 Malignant neoplasm of upper lobe, right bronchus or lung: Secondary | ICD-10-CM

## 2023-05-13 DIAGNOSIS — G893 Neoplasm related pain (acute) (chronic): Secondary | ICD-10-CM

## 2023-05-13 DIAGNOSIS — Z79899 Other long term (current) drug therapy: Secondary | ICD-10-CM | POA: Diagnosis not present

## 2023-05-13 DIAGNOSIS — R530 Neoplastic (malignant) related fatigue: Secondary | ICD-10-CM

## 2023-05-13 DIAGNOSIS — Z923 Personal history of irradiation: Secondary | ICD-10-CM | POA: Diagnosis not present

## 2023-05-13 DIAGNOSIS — Z5111 Encounter for antineoplastic chemotherapy: Secondary | ICD-10-CM | POA: Diagnosis present

## 2023-05-13 DIAGNOSIS — R110 Nausea: Secondary | ICD-10-CM

## 2023-05-13 DIAGNOSIS — Z5112 Encounter for antineoplastic immunotherapy: Secondary | ICD-10-CM | POA: Insufficient documentation

## 2023-05-13 DIAGNOSIS — Z95828 Presence of other vascular implants and grafts: Secondary | ICD-10-CM

## 2023-05-13 LAB — CMP (CANCER CENTER ONLY)
ALT: 5 U/L (ref 0–44)
AST: 15 U/L (ref 15–41)
Albumin: 3.2 g/dL — ABNORMAL LOW (ref 3.5–5.0)
Alkaline Phosphatase: 121 U/L (ref 38–126)
Anion gap: 6 (ref 5–15)
BUN: 29 mg/dL — ABNORMAL HIGH (ref 8–23)
CO2: 26 mmol/L (ref 22–32)
Calcium: 8.8 mg/dL — ABNORMAL LOW (ref 8.9–10.3)
Chloride: 105 mmol/L (ref 98–111)
Creatinine: 2.5 mg/dL — ABNORMAL HIGH (ref 0.61–1.24)
GFR, Estimated: 25 mL/min — ABNORMAL LOW (ref >60)
Glucose, Bld: 156 mg/dL — ABNORMAL HIGH (ref 70–99)
Potassium: 3.9 mmol/L (ref 3.5–5.1)
Sodium: 137 mmol/L (ref 135–145)
Total Bilirubin: 0.7 mg/dL (ref 0.0–1.2)
Total Protein: 7.1 g/dL (ref 6.5–8.1)

## 2023-05-13 LAB — CBC WITH DIFFERENTIAL (CANCER CENTER ONLY)
Abs Immature Granulocytes: 0.03 10*3/uL (ref 0.00–0.07)
Basophils Absolute: 0 10*3/uL (ref 0.0–0.1)
Basophils Relative: 1 %
Eosinophils Absolute: 0.1 10*3/uL (ref 0.0–0.5)
Eosinophils Relative: 2 %
HCT: 26.8 % — ABNORMAL LOW (ref 39.0–52.0)
Hemoglobin: 8.7 g/dL — ABNORMAL LOW (ref 13.0–17.0)
Immature Granulocytes: 0 %
Lymphocytes Relative: 20 %
Lymphs Abs: 1.6 10*3/uL (ref 0.7–4.0)
MCH: 30.7 pg (ref 26.0–34.0)
MCHC: 32.5 g/dL (ref 30.0–36.0)
MCV: 94.7 fL (ref 80.0–100.0)
Monocytes Absolute: 0.6 10*3/uL (ref 0.1–1.0)
Monocytes Relative: 7 %
Neutro Abs: 5.9 10*3/uL (ref 1.7–7.7)
Neutrophils Relative %: 70 %
Platelet Count: 240 10*3/uL (ref 150–400)
RBC: 2.83 MIL/uL — ABNORMAL LOW (ref 4.22–5.81)
RDW: 15.4 % (ref 11.5–15.5)
WBC Count: 8.3 10*3/uL (ref 4.0–10.5)
nRBC: 0 % (ref 0.0–0.2)

## 2023-05-13 LAB — TSH: TSH: 2.755 u[IU]/mL (ref 0.350–4.500)

## 2023-05-13 MED ORDER — IPILIMUMAB CHEMO INJECTION 50 MG/10ML
1.0000 mg/kg | Freq: Once | INTRAVENOUS | Status: AC
  Administered 2023-05-13: 85 mg via INTRAVENOUS
  Filled 2023-05-13: qty 17

## 2023-05-13 MED ORDER — HYDROCODONE-ACETAMINOPHEN 5-325 MG PO TABS
1.0000 | ORAL_TABLET | Freq: Four times a day (QID) | ORAL | 0 refills | Status: DC | PRN
Start: 2023-05-13 — End: 2023-06-03

## 2023-05-13 MED ORDER — FAMOTIDINE IN NACL 20-0.9 MG/50ML-% IV SOLN
20.0000 mg | Freq: Once | INTRAVENOUS | Status: AC
  Administered 2023-05-13: 20 mg via INTRAVENOUS
  Filled 2023-05-13: qty 50

## 2023-05-13 MED ORDER — SODIUM CHLORIDE 0.9% FLUSH
10.0000 mL | Freq: Once | INTRAVENOUS | Status: AC
  Administered 2023-05-13: 10 mL

## 2023-05-13 MED ORDER — ONDANSETRON HCL 4 MG PO TABS: 4.0000 mg | ORAL_TABLET | Freq: Three times a day (TID) | ORAL | 2 refills | Status: DC | PRN

## 2023-05-13 MED ORDER — SODIUM CHLORIDE 0.9 % IV SOLN
360.0000 mg | Freq: Once | INTRAVENOUS | Status: AC
  Administered 2023-05-13: 360 mg via INTRAVENOUS
  Filled 2023-05-13: qty 12

## 2023-05-13 MED ORDER — DIPHENHYDRAMINE HCL 50 MG/ML IJ SOLN
25.0000 mg | Freq: Once | INTRAMUSCULAR | Status: AC
  Administered 2023-05-13: 25 mg via INTRAVENOUS
  Filled 2023-05-13: qty 1

## 2023-05-13 MED ORDER — SODIUM CHLORIDE 0.9 % IV SOLN: INTRAVENOUS | Status: DC

## 2023-05-13 NOTE — Progress Notes (Signed)
 Ipilimumab (YERVOY) Patient Monitoring Assessment   Is the patient experiencing any of the following general symptoms?:  [x] Patient is not experiencing any of the general symptoms listed in this section.  [] Difficulty performing normal activities [] Feeling sluggish or cold all the time [] Unusual weight gain [] Constant or unusual headaches [] Feeling dizzy or faint [] Changes in eyesight (blurry vision, double vision, or other vision problems) [] Changes in mood or behavior (ex: decreased sex drive, irritability, or forgetfulness) [] Starting new medications (ex: steroids, other medications that lower immune response)   Gastrointestinal  Patient is having 1bowel movements each day.  Is this different from baseline? [] Yes [x] No Are your stools watery or do they have a foul smell? [] Yes [x] No Have you seen blood in your stools? [] Yes [x] No Are your stools dark, tarry, or sticky? [] Yes [x] No Are you having pain or tenderness in your belly? [] Yes [x] No  Skin Does your skin itch? [] Yes [x] No Do you have a rash? [] Yes [x] No Has your skin blistered and/or peeled? [] Yes [x] No Do you have sores in your mouth? [] Yes [x] No  Hepatic Has your urine been dark or tea colored? [] Yes [x] No Have you noticed your skin or the whites of your eyes are turning yellow? [] Yes [x] No Are you bleeding or bruising more easily than normal? [] Yes [x] No Are you nauseous and/or vomiting? [] Yes [x] No Do you have pain on the right side of your stomach? [] Yes [x] No  Neurologic  Are you having unusual weakness of legs, arms, or face? [] Yes [x] No Are you having numbness or tingling in your hands or feet? [] Yes [x] No  Blair Bumpers

## 2023-05-13 NOTE — Progress Notes (Signed)
 Palliative Medicine [facility]  [phone] [phone]   Name: Jeffery Fernandez Date: 05/13/2023 MRN: [id number]  DOB: Mar 16, 1945  Patient Care Team: [name], MD as PCP - General (Family Medicine) Chilton Si, MD as PCP - Cardiology (Cardiology) Pickenpack-Cousar, Arty Baumgartner, NP as Nurse Practitioner (Hospice and Palliative Medicine)    INTERVAL HISTORY: Jeffery Fernandez is a 78 y.o. 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 As well as COPD, DM type 2, arthritis, hypertension, and hyperlipidemia. Palliative ask to see for symptom management and goals of care.   SOCIAL HISTORY:     reports that he quit smoking about 37 years ago. His smoking use included cigarettes. He started smoking about 62 years ago. He has a 37.5 pack-year smoking history. He has never used smokeless tobacco. He reports that he does not currently use alcohol. He reports that he does not use drugs.  ADVANCE DIRECTIVES:  Advanced directives on file name Jeffery Fernandez as the primary and Jeffery Fernandez as secondary decision makers should the patient become unable to make his own decisions.  CODE STATUS: Full code  PAST MEDICAL HISTORY: Past Medical History:  Diagnosis Date   Acid reflux    Arrhythmia    Arthritis    Cataract    bilateral cataract extraction with intraoccular lens implants   Chest pain, unspecified    CHF (congestive heart failure) (HCC)    Chronic airway obstruction, not elsewhere classified    PT. DENIES HE HAS COPD   CKD (chronic kidney disease)    Colon polyps    Adenomatous Polyps 2007   Depression    Diabetes mellitus    Diverticulosis    Gout    Heart murmur    History of kidney stones    History of radiation therapy    Thoracic Spine- 01/27/23-02/10/23- Dr. Antony Blackbird   Hyperlipemia    Hypertension    Lung cancer (HCC) 12/2022   Nephrolithiasis    Normocytic anemia 04/23/2017    Obstructive sleep apnea (adult) (pediatric)    Other and unspecified hyperlipidemia    Other left bundle branch block    Retinal vascular occlusion, unspecified    right   Shortness of breath    on exertion   Unspecified sleep apnea    Vision loss of right eye    White coat syndrome with diagnosis of hypertension 09/19/2017    ALLERGIES:  is allergic to amlodipine, penicillins, and hydralazine.  MEDICATIONS:  Current Outpatient Medications  Medication Sig Dispense Refill   HYDROcodone-acetaminophen (NORCO/VICODIN) 5-325 MG tablet Take 1 tablet by mouth every 6 (six) hours as needed for moderate pain (pain score 4-6). 45 tablet 0   acetaminophen (TYLENOL) 500 MG tablet Take 1 tablet (500 mg total) by mouth every 8 (eight) hours for 10 days. 30 tablet 0   aspirin EC 325 MG tablet Take 1 tablet (325 mg total) by mouth daily. 30 tablet 0   atorvastatin (LIPITOR) 40 MG tablet Take 40 mg by mouth at bedtime.     buPROPion (WELLBUTRIN XL) 150 MG 24 hr tablet Take 150 mg by mouth every morning.     Dulaglutide (TRULICITY) 1.5 MG/0.5ML SOAJ Inject 1.5 mg into the skin daily. Takes on Thursdays     famotidine (PEPCID) 20 MG tablet Take 1 tablet (20 mg total) by mouth 2 (two) times daily. (Patient taking differently: Take 20 mg by mouth daily.) 60 tablet 3   gabapentin (  NEURONTIN) 100 MG capsule Take 1 capsule (100 mg total) by mouth daily.     ibuprofen (ADVIL) 800 MG tablet Take 1 tablet (800 mg total) by mouth every 8 (eight) hours for 10 days. Please take with food, please alternate with acetaminophen 30 tablet 0   insulin glargine (LANTUS) 100 UNIT/ML injection Inject 41 Units into the skin in the morning.     loratadine (CLARITIN) 10 MG tablet Take 10 mg by mouth daily.     LORazepam (ATIVAN) 0.5 MG tablet Take 1 tablet (0.5 mg total) by mouth every 8 (eight) hours as needed for anxiety. 0.5 mg p.o. every 8 hours as needed for anxiety/nausea. 30 tablet 0   ondansetron (ZOFRAN) 4 MG tablet  Take 1 tablet (4 mg total) by mouth every 8 (eight) hours as needed for nausea or vomiting. 30 tablet 2   OXYGEN Inhale 1.5 L into the lungs daily as needed (Shortness of breath).     prochlorperazine (COMPAZINE) 10 MG tablet Take 1 tablet (10 mg total) by mouth every 6 (six) hours as needed for nausea or vomiting. 30 tablet 1   sertraline (ZOLOFT) 100 MG tablet Take 100 mg by mouth in the morning.     torsemide (DEMADEX) 20 MG tablet Take 2 tablets (40 mg total) by mouth daily. (Patient not taking: Reported on 03/27/2023) 60 tablet 0   No current facility-administered medications for this visit.   Facility-Administered Medications Ordered in Other Visits  Medication Dose Route Frequency Provider Last Rate Last Admin   0.9 %  sodium chloride infusion   Intravenous Continuous Si Gaul, MD   Stopped at 05/13/23 1409    VITAL SIGNS: There were no vitals taken for this visit. There were no vitals filed for this visit.  Estimated body mass index is 24.84 kg/m as calculated from the following:   Height as of an earlier encounter on 05/13/23: 5\' 10"  (1.778 m).   Weight as of an earlier encounter on 05/13/23: 173 lb 1.6 oz (78.5 kg).   PERFORMANCE STATUS (ECOG) : 1 - Symptomatic but completely ambulatory   Physical Exam General: NAD Cardiovascular: regular rate and rhythm Pulmonary: normal breathing pattern  Extremities: no edema, no joint deformities Skin: no rashes Neurological: AAOx3  Discussed the use of AI scribe software for clinical note transcription with the patient, who gave verbal consent to proceed.  IMPRESSION:  I saw Jeffery Fernandez during his infusion. No acute distress. Resting. Daughter shares recent   Discussed the use of AI scribe software for clinical note transcription with the patient, who gave verbal consent to proceed.  History of Present Illness Jeffery Fernandez is a 78 year old male with history of metastatic NSCLC who presents for symptom management follow-up.  Patient seen during infusion. Tolerating well. No acute distress. Resting comfortably. Accompanied by his daughter. Patient is s/p right femur nailing and is currently in the recovery phase. Occasional nausea which is being managed with Zofran. Denies concerns of constipation or diarrhea.   Patient is scheduled to begin radiation therapy to right femur next week. There is growing concern about his cognitive status, as he has shown progressive signs or memory changes and actions over the past several months. His caregiver has noted unusual behavior, such as 'eating stuff,' when he is not actually doing so. Previously asking about a family pet who is deceased.   We discussed his pain at length. Oxycodone was prescribed after surgery however he has not tolerated well and family prefers to manage pain with  his previously prescribed hydrocodone.  Pain overall is controlled.  No adjustments to current regimen at this time.  Advised to continue to take hydrocodone as needed. Gabapentin is taken once daily, which is the maximum he can tolerate. Hydrocodone on hand as needed. Ativan as needed for anxiety.  Daughter expresses concerns with her father's recent health challenges and is requesting a more close follow-up and support as needed.  All questions answered.  I discussed the importance of continued conversation with family and their medical providers regarding overall plan of care and treatment options, ensuring decisions are within the context of the patients values and GOCs.  Assessment & Plan Postoperative care for leg surgery/metastatic bone disease Post-surgery pain managed with oxycodone however patient does not tolerate well.  Will defer back to his hydrocodone prescription. - Continue hydrocodone 1.5-2 tablets every 4-6 hours as needed for pain management. -  Zofran as needed for nausea. - Continue gabapentin daily - Will continue to closely monitor and support.  Radiation therapy for  leg Scheduled radiation therapy as part of treatment plan post-surgery per oncology. - Proceed with scheduled radiation therapy next week.  I will plan to see patient back in 2-3 weeks.  Sooner if needed.  Patient expressed understanding and was in agreement with this plan. He also understands that He can call the clinic at any time with any questions, concerns, or complaints.   Any controlled substances utilized were prescribed in the context of palliative care. PDMP has been reviewed.   Visit consisted of counseling and education dealing with the complex and emotionally intense issues of symptom management and palliative care in the setting of serious and potentially life-threatening illness.  Dellia Ferguson, AGPCNP-BC  Palliative Medicine Team/[facility]

## 2023-05-14 ENCOUNTER — Encounter: Payer: Self-pay | Admitting: Internal Medicine

## 2023-05-14 LAB — T4: T4, Total: 5.9 ug/dL (ref 4.5–12.0)

## 2023-05-15 ENCOUNTER — Telehealth: Payer: Self-pay

## 2023-05-15 NOTE — Telephone Encounter (Signed)
 Spoke with patients daughter, Ammon Bales in regards to scan results.  Patient was seen in office 05/13/2023 and the results were not read then. Informed Ammon Bales a message will be sent to providers to review and our office will call back with response. She voiced understanding.

## 2023-05-16 ENCOUNTER — Encounter: Payer: Self-pay | Admitting: Internal Medicine

## 2023-05-16 NOTE — Telephone Encounter (Signed)
 Spoke with patients daughter Ammon Bales in regards to scan results. Per Dr. Marguerita Shih- I reviewed the scan results and is acceptable for now.  A more in depth discussion at next appt on 06/03/2023.  Informed her that if patient experienced SOB or pain to give our office a call.  Ammon Bales expressed thanks.

## 2023-05-20 ENCOUNTER — Other Ambulatory Visit: Payer: HMO

## 2023-05-20 NOTE — Progress Notes (Incomplete)
 Histology and Location of Primary Cancer:  Primary adenocarcinoma of upper lobe of right lung   Sites of Visceral and Bony Metastatic Disease: ***  Location(s) of Symptomatic Metastases: Right femur  Past/Anticipated chemotherapy by medical oncology, if any:  Assessment & Plan Postoperative care for leg surgery/metastatic bone disease Post-surgery pain managed with oxycodone  however patient does not tolerate well.  Will defer back to his hydrocodone  prescription. - Continue hydrocodone  1.5-2 tablets every 4-6 hours as needed for pain management. -  Zofran  as needed for nausea. - Continue gabapentin  daily - Will continue to closely monitor and support.   Radiation therapy for leg Scheduled radiation therapy as part of treatment plan post-surgery per oncology. - Proceed with scheduled radiation therapy next week.   I will plan to see patient back in 2-3 weeks.  Sooner if needed. Dellia Ferguson, AGPCNP-BC  Palliative Medicine Team/[facility]           Contains text generated by [employer]      Electronically signed by Jeb Miner, NP at 05/13/2023   Pain on a scale of 0-10 is: {Number; 1-10  not applicable:20727}    If Spine Met(s), symptoms, if any, include: Bowel/Bladder retention or incontinence (please describe): *** Numbness or weakness in extremities (please describe): *** Current Decadron  regimen, if applicable: ***  Ambulatory status? Walker? Wheelchair?: {VQI Ambulatory Status:20974}  SAFETY ISSUES: Prior radiation? {:18581} Pacemaker/ICD? {:18581} Possible current pregnancy? {:18581} Is the patient on methotrexate? {:18581}  Current Complaints / other details:  ***

## 2023-05-20 NOTE — Progress Notes (Signed)
 Radiation Oncology         [phone] ________________________________  Initial Out patient Re-Consultation  Name: Jeffery Fernandez MRN: [id number]  Date: 05/21/2023  DOB: [birth year]  GU:[id number], Myrtie Atkinson, MD  Wilhelmenia Harada, MD   REFERRING PHYSICIAN: Wilhelmenia Harada, MD  DIAGNOSIS: There were no encounter diagnoses.  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      HISTORY OF PRESENT ILLNESS::Jeffery Fernandez is a 78 y.o. male with stage IV adenocarcinoma and bone metastasis. Last seen in office for a follow up on 03/27/23.   Patient return today for an opinion concerning palliative radiation therapy as part of management for his recurrent bone metastasis.   In the interval since being seen, he underwent orthopedic surgery for a metastatic lesion in his femur by Dr. Hermina Loosen on 05/06/2023.  He tolerated it fairly well. He started ambulating and uses a cane and a walker.   Most recent CT scan preformed on 05/01/23 showed slight interval decrease in size of right upper lobe pulmonary mass; increasing inter and intra lobular septal thickening within the right upper lobe which may reflect early changes of lymphangitic spread of malignancy; stable hypermetabolic shotty mediastinal adenopathy and a stable T10 osseous metastasis.     He has been closely followed by palliative care for which he takes gabapentin  1 time daily, 1-1/2 tablets of hydrocodone  every 4-6 hours for pain management which seems to helps. During his most recent follow up with NP, Pickenpack-Cousar on 05/13/23, there was concerns over his progressive worsening signs of his memory and actions over the past several months. At that time, he also complained of increased pain which he was recommended continuing hydrocodone  for. He was referred to radiation to undergo palliative therapy to his leg and femur.      PREVIOUS RADIATION THERAPY: Yes  Radiation treatment dates:      First Treatment Date: 2023-01-27   Last  Treatment Date: 2023-02-10   Site/Dose/Technique/Mode:  Plan Name: Spine_T Site: Thoracic Spine Technique: 3D Mode: Photon Dose Per Fraction: 3 Gy Prescribed Dose (Delivered / Prescribed): 30 Gy / 30 Gy Prescribed Fxs (Delivered / Prescribed): 10 / 10 PAST MEDICAL HISTORY:  Past Medical History:  Diagnosis Date   Acid reflux    Arrhythmia    Arthritis    Cataract    bilateral cataract extraction with intraoccular lens implants   Chest pain, unspecified    CHF (congestive heart failure) (HCC)    Chronic airway obstruction, not elsewhere classified    PT. DENIES HE HAS COPD   CKD (chronic kidney disease)    Colon polyps    Adenomatous Polyps 2007   Depression    Diabetes mellitus    Diverticulosis    Gout    Heart murmur    History of kidney stones    History of radiation therapy    Thoracic Spine- 01/27/23-02/10/23- Dr. Retta Caster   Hyperlipemia    Hypertension    Lung cancer (HCC) 12/2022   Nephrolithiasis    Normocytic anemia 04/23/2017   Obstructive sleep apnea (adult) (pediatric)    Other and unspecified hyperlipidemia    Other left bundle branch block    Retinal vascular occlusion, unspecified    right   Shortness of breath    on exertion   Unspecified sleep apnea    Vision loss of right eye    White coat syndrome with diagnosis of hypertension 09/19/2017    PAST SURGICAL HISTORY: Past Surgical History:  Procedure Laterality  Date   ANTERIOR CERVICAL DECOMP/DISCECTOMY FUSION N/A 06/26/2016   Procedure: Re-Exploration of Cervical Wound;  Surgeon: Gearl Keens, MD;  Location: [facility] OR;  Service: Neurosurgery;  Laterality: N/A;   ANTERIOR CERVICAL DECOMP/DISCECTOMY FUSION N/A 06/26/2016   Procedure: ANTERIOR CERVICAL DECOMPRESSION/DISCECTOMY FUSION, INTERBODY PROSTESIS, PLATE, CERVICAL THREE CERVICAL FOUR, CERVICAL FOUR CERVICAL FIVE CERVICAL SIX;  Surgeon: Garry Kansas, MD;  Location: [facility] OR;  Service: Neurosurgery;  Laterality: N/A;   APPLICATION OF WOUND VAC   06/05/2020   Procedure: APPLICATION OF WOUND VAC;  Surgeon: Garry Kansas, MD;  Location: [facility] OR;  Service: Neurosurgery;;   BACK SURGERY     CATARACT EXTRACTION Bilateral    FEMUR IM NAIL Right 05/06/2023   Procedure: RIGHT FEMUR NAILING;  Surgeon: Wilhelmenia Harada, MD;  Location: [facility] OR;  Service: Orthopedics;  Laterality: Right;  RIGHT INTERTROCHANTERIC INTRAMEDULLARY HIP NAIL   IR IMAGING GUIDED PORT INSERTION  02/19/2023   KIDNEY STONE SURGERY     KYPHOPLASTY N/A 01/19/2018   Procedure: KYPHOPLASTY LUMBAR THREE;  Surgeon: Garry Kansas, MD;  Location: [facility] OR;  Service: Neurosurgery;  Laterality: N/A;  KYPHOPLASTY LUMBAR THREE   LUMBAR WOUND DEBRIDEMENT N/A 06/05/2020   Procedure: LUMBAR WOUND DEBRIDEMENT;  Surgeon: Garry Kansas, MD;  Location: [facility] OR;  Service: Neurosurgery;  Laterality: N/A;    FAMILY HISTORY:  Family History  Problem Relation Age of Onset   Cancer Mother    Uterine cancer Mother    Heart disease Father    Stroke Father    Cancer Sister        Lung cancer   Heart attack Brother 60       heart attack and CHF/ also some form of EP ablation   Parkinson's disease Brother    Atrial fibrillation Brother    Arthritis/Rheumatoid Cousin    Diabetes Other    Colon cancer Neg Hx    Stomach cancer Neg Hx    Liver cancer Neg Hx    Rectal cancer Neg Hx    Esophageal cancer Neg Hx     SOCIAL HISTORY:  Social History   Tobacco Use   Smoking status: Former    Current packs/day: 0.00    Average packs/day: 1.5 packs/day for 25.0 years (37.5 ttl pk-yrs)    Types: Cigarettes    Start date: 01/28/1961    Quit date: 01/28/1986    Years since quitting: 37.3   Smokeless tobacco: Never  Vaping Use   Vaping status: Never Used  Substance Use Topics   Alcohol use: Not Currently    Comment: Very rare   Drug use: No    ALLERGIES:  Allergies  Allergen Reactions   Amlodipine  Swelling    Patient report facial swelling at high doses   Penicillins Other (See Comments)     05/06/23 tolerated cefazolin ;   Hydralazine  Other (See Comments)    Dizziness    MEDICATIONS:  Current Outpatient Medications  Medication Sig Dispense Refill   aspirin  EC 325 MG tablet Take 1 tablet (325 mg total) by mouth daily. 30 tablet 0   atorvastatin  (LIPITOR) 40 MG tablet Take 40 mg by mouth at bedtime.     buPROPion  (WELLBUTRIN  XL) 150 MG 24 hr tablet Take 150 mg by mouth every morning.     Dulaglutide (TRULICITY) 1.5 MG/0.5ML SOAJ Inject 1.5 mg into the skin daily. Takes on Thursdays     famotidine  (PEPCID ) 20 MG tablet Take 1 tablet (20 mg total) by mouth 2 (two) times daily. (Patient taking differently: Take  20 mg by mouth daily.) 60 tablet 3   gabapentin  (NEURONTIN ) 100 MG capsule Take 1 capsule (100 mg total) by mouth daily.     HYDROcodone -acetaminophen  (NORCO/VICODIN) 5-325 MG tablet Take 1 tablet by mouth every 6 (six) hours as needed for moderate pain (pain score 4-6). 45 tablet 0   insulin  glargine (LANTUS ) 100 UNIT/ML injection Inject 41 Units into the skin in the morning.     loratadine  (CLARITIN ) 10 MG tablet Take 10 mg by mouth daily.     LORazepam  (ATIVAN ) 0.5 MG tablet Take 1 tablet (0.5 mg total) by mouth every 8 (eight) hours as needed for anxiety. 0.5 mg p.o. every 8 hours as needed for anxiety/nausea. 30 tablet 0   ondansetron  (ZOFRAN ) 4 MG tablet Take 1 tablet (4 mg total) by mouth every 8 (eight) hours as needed for nausea or vomiting. 30 tablet 2   OXYGEN Inhale 1.5 L into the lungs daily as needed (Shortness of breath).     prochlorperazine  (COMPAZINE ) 10 MG tablet Take 1 tablet (10 mg total) by mouth every 6 (six) hours as needed for nausea or vomiting. 30 tablet 1   sertraline  (ZOLOFT ) 100 MG tablet Take 100 mg by mouth in the morning.     torsemide  (DEMADEX ) 20 MG tablet Take 2 tablets (40 mg total) by mouth daily. (Patient not taking: Reported on 03/27/2023) 60 tablet 0   No current facility-administered medications for this encounter.    REVIEW OF SYSTEMS:   A 10+ POINT REVIEW OF SYSTEMS WAS OBTAINED including neurology, dermatology, psychiatry, cardiac, respiratory, lymph, extremities, GI, GU, musculoskeletal, constitutional, reproductive, HEENT. ***   PHYSICAL EXAM:  vitals were not taken for this visit.   General: Alert and oriented, in no acute distress HEENT: Head is normocephalic. Extraocular movements are intact. Oropharynx is clear. Neck: Neck is supple, no palpable cervical or supraclavicular lymphadenopathy. Heart: Regular in rate and rhythm with no murmurs, rubs, or gallops. Chest: Clear to auscultation bilaterally, with no rhonchi, wheezes, or rales. Abdomen: Soft, nontender, nondistended, with no rigidity or guarding. Extremities: No cyanosis or edema. Lymphatics: see Neck Exam Skin: No concerning lesions. Musculoskeletal: symmetric strength and muscle tone throughout. Neurologic: Cranial nerves II through XII are grossly intact. No obvious focalities. Speech is fluent. Coordination is intact. Psychiatric: Judgment and insight are intact. Affect is appropriate. ***  ECOG = ***  0 - Asymptomatic (Fully active, able to carry on all predisease activities without restriction)  1 - Symptomatic but completely ambulatory (Restricted in physically strenuous activity but ambulatory and able to carry out work of a light or sedentary nature. For example, light housework, office work)  2 - Symptomatic, <50% in bed during the day (Ambulatory and capable of all self care but unable to carry out any work activities. Up and about more than 50% of waking hours)  3 - Symptomatic, >50% in bed, but not bedbound (Capable of only limited self-care, confined to bed or chair 50% or more of waking hours)  4 - Bedbound (Completely disabled. Cannot carry on any self-care. Totally confined to bed or chair)  5 - Death   Aurea Blossom MM, Creech RH, Tormey DC, et al. [phone]). "Toxicity and response criteria of the [employer]". Am. [employer].  Oncol. 5 (6): 649-55  LABORATORY DATA:  Lab Results  Component Value Date   WBC 8.3 05/13/2023   HGB 8.7 (L) 05/13/2023   HCT 26.8 (L) 05/13/2023   MCV 94.7 05/13/2023   PLT 240 05/13/2023  NEUTROABS 5.9 05/13/2023   Lab Results  Component Value Date   NA 137 05/13/2023   K 3.9 05/13/2023   CL 105 05/13/2023   CO2 26 05/13/2023   GLUCOSE 156 (H) 05/13/2023   BUN 29 (H) 05/13/2023   CREATININE 2.50 (H) 05/13/2023   CALCIUM  8.8 (L) 05/13/2023      RADIOGRAPHY: CT CHEST ABDOMEN PELVIS WO CONTRAST Result Date: 05/15/2023 CLINICAL DATA:  Non-small cell lung cancer (NSCLC), staging. * Tracking Code: BO * EXAM: CT CHEST, ABDOMEN AND PELVIS WITHOUT CONTRAST TECHNIQUE: Multidetector CT imaging of the chest, abdomen and pelvis was performed following the standard protocol without IV contrast. RADIATION DOSE REDUCTION: This exam was performed according to the departmental dose-optimization program which includes automated exposure control, adjustment of the mA and/or kV according to patient size and/or use of iterative reconstruction technique. COMPARISON:  PET-CT 12/16/2022 FINDINGS: CT CHEST FINDINGS Cardiovascular: Extensive multi-vessel coronary artery calcification. Global cardiac size within normal limits. No pericardial effusion. Central pulmonary arteries are of normal caliber. Extensive atherosclerotic calcification within the thoracic aorta. No aortic aneurysm. Left external jugular chest port tip seen at the superior cavoatrial junction. Mediastinum/Nodes: Visualized thyroid  is unremarkable. Shotty mediastinal adenoma appears stable since prior examination, noted to be hypermetabolic on prior exam. No frankly pathologic thoracic adenopathy by size criteria. Index nodal conglomerate within the low right paratracheal region is stable measuring 15 mm in short axis diameter at axial image # 22/2. The esophagus is unremarkable. Lungs/Pleura: Spiculated, multinodular right upper lobe mass  appears slightly decreased in overall size since prior examination, measuring 2.2 x 3.5 cm superiorly and 3.2 x 4.7 cm in diameter inferiorly at axial image # 28/5 and # 39/5, respectively. There is, however, increasing inter and intra lobular irregular septal thickening which may reflect early changes of lymphangitic spread of malignancy. Stable elevation of the right hemidiaphragm. Stable subpleural fibrotic changes within the lung bases bilaterally. Previously noted subpleural reticular infiltrates within mid to upper lung zones prior examination, likely inflammatory, have largely resolved with minimal residual infiltrate noted within the left upper lobe. No new focal pulmonary nodules. No pneumothorax or pleural effusion. Musculoskeletal: Permeative osseous metastasis with T10 vertebral body appears similar to prior examination. No associated pathologic fracture. No new lytic or blastic bone lesions are identified. CT ABDOMEN PELVIS FINDINGS Hepatobiliary: No focal liver abnormality is seen. No gallstones, gallbladder wall thickening, or biliary dilatation. Pancreas: Unremarkable Spleen: Unremarkable Adrenals/Urinary Tract: The adrenal glands are unremarkable. The kidneys are normal in position. Moderate to severe bilateral renal cortical atrophy. 10 mm nonobstructing calculus noted within the lower pole of the right kidney. No hydronephrosis. No ureteral calculi. The bladder is largely decompressed. Stomach/Bowel: Moderate to severe pancolonic diverticulosis, most severe within the sigmoid colon noted. No superimposed acute inflammatory change. Stomach, small bowel, and large bowel are otherwise unremarkable. Appendix normal. No evidence of obstruction or focal inflammation. No free intraperitoneal gas or fluid. Vascular/Lymphatic: Aortic atherosclerosis. No enlarged abdominal or pelvic lymph nodes. Reproductive: Prostate is unremarkable. Other: Small fat containing left inguinal hernia. Subcutaneous gas within  the visualized left anterior thigh may relate to subcutaneous injection local trauma. Musculoskeletal: L3-L5 vertebroplasty and L4-5 posterior lumbar fusion with instrumentation again noted. No acute bone abnormality. No lytic or blastic bone lesion. IMPRESSION: 1. Slight interval decrease in size of right upper lobe pulmonary mass. 2. Increasing inter and intra lobular septal thickening within the right upper lobe which may reflect early changes of lymphangitic spread of malignancy. Close attention on subsequent surveillance  examination warranted. 3. Stable shotty mediastinal adenopathy. While not frankly malignant based on size criteria, these are noted to be hypermetabolic on prior PET CT examination and are compatible with regional nodal metastatic disease. 4. Stable T10 osseous metastasis. No new lytic or blastic bone lesions identified. 5. Extensive multi-vessel coronary artery calcification. 6. Nonobstructing 10 mm right renal calculus. 7. Pancolonic diverticulosis without superimposed acute inflammatory change. 8.  Aortic Atherosclerosis (ICD10-I70.0). Electronically Signed   By: Worthy Heads M.D.   On: 05/15/2023 01:06   DG FEMUR, MIN 2 VIEWS RIGHT Result Date: 05/07/2023 CLINICAL DATA:  Elective surgery. EXAM: RIGHT FEMUR 2 VIEWS COMPARISON:  Preoperative imaging FINDINGS: Seven fluoroscopic spot views of the right femur submitted from the operating room. Femoral intramedullary nail with trans trochanteric and distal locking screw fixation. Fluoroscopy time 1 minutes 32 seconds. Dose 7.1 mGy. IMPRESSION: Intraoperative fluoroscopy during right femoral intramedullary nail fixation. Electronically Signed   By: Chadwick Colonel M.D.   On: 05/07/2023 00:10   DG C-Arm 1-60 Min-No Report Result Date: 05/06/2023 Fluoroscopy was utilized by the requesting physician.  No radiographic interpretation.   MR BRAIN W WO CONTRAST Result Date: 05/01/2023 CLINICAL DATA:  Non-small cell lung carcinoma EXAM: MRI HEAD  WITHOUT AND WITH CONTRAST TECHNIQUE: Multiplanar, multiecho pulse sequences of the brain and surrounding structures were obtained without and with intravenous contrast. CONTRAST:  8mL GADAVIST  GADOBUTROL  1 MMOL/ML IV SOLN COMPARISON:  04/10/2016 FINDINGS: Brain: No acute infarct, mass effect or extra-axial collection. No acute or chronic hemorrhage. There is confluent hyperintense T2-weighted signal within the white matter. Generalized volume loss. The midline structures are normal. Vascular: Normal flow voids. Skull and upper cervical spine: Normal calvarium and skull base. Visualized upper cervical spine and soft tissues are normal. Sinuses/Orbits:No paranasal sinus fluid levels or advanced mucosal thickening. No mastoid or middle ear effusion. Normal orbits. IMPRESSION: 1. No intracranial metastatic disease. 2. 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 Electronically Signed   By: Juanetta Nordmann M.D.   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    ***  Today, I talked to the patient and family about the findings and work-up thus far.  We discussed the natural history of *** and general treatment, highlighting the role of radiotherapy in the management.  We discussed the available radiation techniques, and focused on the details of logistics and delivery.  We reviewed the anticipated acute and late sequelae associated with radiation in this setting.  The patient was encouraged to ask questions that I answered to the best of my ability. *** A patient consent form was discussed and signed.  We retained a copy for our records.  The patient would like to proceed with radiation and will be scheduled for CT simulation.  PLAN: ***    *** minutes of total time was spent for this patient encounter, including preparation, face-to-face counseling with the patient and coordination of care, physical exam, and documentation of the encounter.    ------------------------------------------------  Noralee Beam, PhD, MD  This document serves as a record of services personally performed by Retta Caster, MD. It was created on his behalf by Lucky Sable, a trained medical scribe. The creation of this record is based on the scribe's personal observations and the provider's statements to them. This document has been checked and approved by the attending provider.

## 2023-05-21 ENCOUNTER — Ambulatory Visit
Admission: RE | Admit: 2023-05-21 | Discharge: 2023-05-21 | Disposition: A | Discharge status: Home / Self Care | Source: Ambulatory Visit | Attending: Radiation Oncology | Admitting: Radiation Oncology

## 2023-05-21 ENCOUNTER — Ambulatory Visit (INDEPENDENT_AMBULATORY_CARE_PROVIDER_SITE_OTHER): Admitting: Orthopaedic Surgery

## 2023-05-21 ENCOUNTER — Encounter: Payer: Self-pay | Admitting: Radiation Oncology

## 2023-05-21 ENCOUNTER — Ambulatory Visit (INDEPENDENT_AMBULATORY_CARE_PROVIDER_SITE_OTHER)

## 2023-05-21 ENCOUNTER — Ambulatory Visit
Admission: RE | Admit: 2023-05-21 | Discharge: 2023-05-21 | Disposition: A | Discharge status: Home / Self Care | Payer: Self-pay | Source: Ambulatory Visit | Attending: Radiation Oncology | Admitting: Radiation Oncology

## 2023-05-21 VITALS — BP 113/77 | HR 98 | Temp 97.2°F | Resp 20 | Ht 70.0 in | Wt 175.0 lb

## 2023-05-21 DIAGNOSIS — C3411 Malignant neoplasm of upper lobe, right bronchus or lung: Secondary | ICD-10-CM | POA: Insufficient documentation

## 2023-05-21 DIAGNOSIS — M899 Disorder of bone, unspecified: Secondary | ICD-10-CM

## 2023-05-21 DIAGNOSIS — C7951 Secondary malignant neoplasm of bone: Secondary | ICD-10-CM | POA: Diagnosis not present

## 2023-05-21 NOTE — Progress Notes (Signed)
 Chief Complaint: Status post right femoral nail 4/8     History of Present Illness:   05/21/2023: Presents today for follow-up 2 weeks status post right femoral nailing.  He has been progressively putting weight on it and overall feeling better.  He is experiencing some bruising down the leg  Jeffery Fernandez is a 78 y.o. 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  He is here today for assessment of the right femoral lesion.  He has been having increasing pain while at rest in his wheelchair.  He states for the last several months he has noted an ongoing and rapid progression of pain in the right thigh.  He has baseline dependent on a walker for ambulation although he does not use this around his house.  He is here today for discussion of prophylactic nailing.  He is pending possible radiation to this area although is seeking prophylactic fixation prior.    PMH/PSH/Family History/Social History/Meds/Allergies:    Past Medical History:  Diagnosis Date   Acid reflux    Arrhythmia    Arthritis    Cataract    bilateral cataract extraction with intraoccular lens implants   Chest pain, unspecified    CHF (congestive heart failure) (HCC)    Chronic airway obstruction, not elsewhere classified    PT. DENIES HE HAS COPD   CKD (chronic kidney disease)    Colon polyps    Adenomatous Polyps 2007   Depression    Diabetes mellitus    Diverticulosis    Gout    Heart murmur    History of kidney stones    History of radiation therapy    Thoracic Spine- 01/27/23-02/10/23- Dr. Retta Caster   Hyperlipemia    Hypertension    Lung cancer (HCC) 12/2022   Nephrolithiasis    Normocytic anemia 04/23/2017   Obstructive sleep apnea (adult) (pediatric)    Other and unspecified hyperlipidemia    Other left bundle branch block    Retinal vascular occlusion, unspecified    right   Shortness of breath    on  exertion   Unspecified sleep apnea    Vision loss of right eye    White coat syndrome with diagnosis of hypertension 09/19/2017   Past Surgical History:  Procedure Laterality Date   ANTERIOR CERVICAL DECOMP/DISCECTOMY FUSION N/A 06/26/2016   Procedure: Re-Exploration of Cervical Wound;  Surgeon: Gearl Keens, MD;  Location: [facility] OR;  Service: Neurosurgery;  Laterality: N/A;   ANTERIOR CERVICAL DECOMP/DISCECTOMY FUSION N/A 06/26/2016   Procedure: ANTERIOR CERVICAL DECOMPRESSION/DISCECTOMY FUSION, INTERBODY PROSTESIS, PLATE, CERVICAL THREE CERVICAL FOUR, CERVICAL FOUR CERVICAL FIVE CERVICAL SIX;  Surgeon: Garry Kansas, MD;  Location: [facility] OR;  Service: Neurosurgery;  Laterality: N/A;   APPLICATION OF WOUND VAC  06/05/2020   Procedure: APPLICATION OF WOUND VAC;  Surgeon: Garry Kansas, MD;  Location: [facility] OR;  Service: Neurosurgery;;   BACK SURGERY     CATARACT EXTRACTION Bilateral    FEMUR IM NAIL Right 05/06/2023   Procedure: RIGHT FEMUR NAILING;  Surgeon: Wilhelmenia Harada, MD;  Location: [facility] OR;  Service: Orthopedics;  Laterality: Right;  RIGHT INTERTROCHANTERIC INTRAMEDULLARY HIP NAIL   IR IMAGING GUIDED PORT INSERTION  02/19/2023   KIDNEY STONE SURGERY     KYPHOPLASTY N/A 01/19/2018   Procedure: KYPHOPLASTY LUMBAR THREE;  Surgeon: Garry Kansas, MD;  Location: [facility] OR;  Service: Neurosurgery;  Laterality: N/A;  KYPHOPLASTY LUMBAR THREE  LUMBAR WOUND DEBRIDEMENT N/A 06/05/2020   Procedure: LUMBAR WOUND DEBRIDEMENT;  Surgeon: Garry Kansas, MD;  Location: [facility] OR;  Service: Neurosurgery;  Laterality: N/A;   Social History   Socioeconomic History   Marital status: Married    Spouse name: Ann   Number of children: 1   Years of education: Not on file   Highest education level: Not on file  Occupational History   Occupation: Retired    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 Orginally from [place]  and moved back there after retirement. He  Has a daughter living in [place]. Quit smoking around 1988. Married   Right-handed   Caffeine: soda daily, decaf coffee   Social Drivers of Corporate investment banker Strain: Low Risk  (04/20/2023)   Received from [facility]   Overall Financial Resource Strain (CARDIA)    Difficulty of Paying Living Expenses: Not hard at all  Food Insecurity: No Food Insecurity (05/06/2023)   Hunger Vital Sign    Worried About Running Out of Food in the Last Year: Never true    Ran Out of Food in the Last Year: Never true  Transportation Needs: No Transportation Needs (05/06/2023)   PRAPARE - Administrator, Civil Service (Medical): No    Lack of Transportation (Non-Medical): No  Physical Activity: Unknown (04/20/2023)   Received [facility]   Exercise Vital Sign    Days of Exercise per Week: 0 days    Minutes of Exercise per Session: Not on file  Stress: Stress Concern Present (04/20/2023)   Received from [facility] - Occupational Stress Questionnaire    Feeling of Stress : Rather much  Social Connections: Patient Declined (05/06/2023)   Social Connection and Isolation Panel [NHANES]    Frequency of Communication with Friends and Family: Patient declined    Frequency of Social Gatherings with Friends and Family: Patient declined    Attends Religious Services: Patient declined    Automotive engineer or Organizations: Patient declined    Attends Engineer, structural: Patient declined    Marital Status: Patient declined   Family History  Problem Relation Age of Onset   Cancer Mother    Uterine cancer Mother    Heart disease Father    Stroke Father    Cancer Sister        Lung cancer   Heart attack Brother 60       heart attack and CHF/ also some form of EP ablation   Parkinson's disease Brother    Atrial fibrillation Brother    Arthritis/Rheumatoid Cousin    Diabetes Other    Colon cancer Neg Hx    Stomach cancer Neg  Hx    Liver cancer Neg Hx    Rectal cancer Neg Hx    Esophageal cancer Neg Hx    Allergies  Allergen Reactions   Amlodipine  Swelling    Patient report facial swelling at high doses   Penicillins Other (See Comments)    05/06/23 tolerated cefazolin ;   Hydralazine  Other (See Comments)    Dizziness   Current Outpatient Medications  Medication Sig Dispense Refill   aspirin  EC 325 MG tablet Take 1 tablet (325 mg total) by mouth daily. 30 tablet 0   atorvastatin  (LIPITOR) 40 MG tablet Take 40 mg by mouth at bedtime.     buPROPion  (WELLBUTRIN  XL) 150 MG 24 hr tablet Take 150 mg by mouth every morning.     Dulaglutide (TRULICITY) 1.5 MG/0.5ML SOAJ Inject 1.5 mg into the skin daily. Takes on Thursdays     famotidine  (PEPCID ) 20 MG tablet Take 1 tablet (20 mg total) by mouth 2 (two) times daily. (Patient taking differently: Take 20 mg by mouth daily.) 60 tablet 3   gabapentin  (NEURONTIN ) 100 MG capsule Take 1 capsule (100 mg total) by mouth daily.     HYDROcodone -acetaminophen  (NORCO/VICODIN) 5-325 MG tablet Take 1 tablet by mouth every 6 (six) hours as needed for moderate pain (pain score 4-6). 45 tablet 0   insulin  glargine (LANTUS ) 100 UNIT/ML injection Inject 41 Units into the skin in the morning.     loratadine  (CLARITIN ) 10 MG tablet Take 10 mg by mouth daily.     LORazepam  (ATIVAN ) 0.5 MG tablet Take 1 tablet (0.5 mg total) by mouth  every 8 (eight) hours as needed for anxiety. 0.5 mg p.o. every 8 hours as needed for anxiety/nausea. 30 tablet 0   ondansetron  (ZOFRAN ) 4 MG tablet Take 1 tablet (4 mg total) by mouth every 8 (eight) hours as needed for nausea or vomiting. 30 tablet 2   OXYGEN Inhale 1.5 L into the lungs daily as needed (Shortness of breath).     prochlorperazine  (COMPAZINE ) 10 MG tablet Take 1 tablet (10 mg total) by mouth every 6 (six) hours as needed for nausea or vomiting. (Patient not taking: Reported on 05/21/2023) 30 tablet 1   sertraline  (ZOLOFT ) 100 MG tablet Take 100 mg by mouth in the morning.     torsemide  (DEMADEX ) 20 MG tablet Take 2 tablets (40 mg total) by mouth daily. (Patient not taking: Reported on 03/27/2023) 60 tablet 0   No current facility-administered medications for this visit.   DG FEMUR, MIN 2 VIEWS RIGHT Result Date: 05/21/2023 CLINICAL DATA:  Postop. EXAM: RIGHT FEMUR 2 VIEWS COMPARISON:  Preoperative imaging FINDINGS: Femoral intramedullary nail with trans trochanteric and distal locking screw fixation. Lucent lesion involving the mid shaft lateral cortex, without significant change from prior. No evidence of acute or pathologic fracture. IMPRESSION: Femoral intramedullary nail fixation without evidence of complication. Electronically Signed   By: Chadwick Colonel M.D.   On: 05/21/2023 11:09    Review of Systems:   A ROS was performed including pertinent positives and negatives as documented in the HPI.  Physical Exam :   Constitutional: NAD and appears stated age Neurological: Alert and oriented Psych: Appropriate affect and cooperative There were no vitals taken for this visit.   Comprehensive Musculoskeletal Exam:   Surgical incision well-appearing without erythema or drainage.  He is able to perform a straight leg raise on the right.  No pain about the hip joint.  Distal neurosensory exam is intact   Imaging:  Xray (2 views right femur): Right diaphyseal femoral lesion  without evidence of femoral nailing failure    I personally reviewed and interpreted the radiographs.   Assessment and Plan:   77 y.o. male with a metastatic lesion non-small cell to the right midshaft femur 2 weeks status post right femoral nailing overall doing well.  At this time the sutures were removed.  He will continue to weight-bear as tolerated.  There is no evidence of complication.  He may receive radiation as needed  - Return to clinic 4 weeks for final check   I personally saw and evaluated the patient, and participated in the management and treatment plan.  Wilhelmenia Harada, MD Attending Physician, Orthopedic Surgery  This document was dictated using Dragon voice recognition software. A reasonable attempt at proof reading has been made to minimize errors.

## 2023-05-27 ENCOUNTER — Encounter: Payer: Self-pay | Admitting: Internal Medicine

## 2023-05-27 ENCOUNTER — Encounter: Payer: Self-pay | Admitting: Medical Oncology

## 2023-05-27 ENCOUNTER — Other Ambulatory Visit: Payer: HMO

## 2023-05-27 DIAGNOSIS — C7951 Secondary malignant neoplasm of bone: Secondary | ICD-10-CM | POA: Diagnosis present

## 2023-05-27 DIAGNOSIS — C3411 Malignant neoplasm of upper lobe, right bronchus or lung: Secondary | ICD-10-CM | POA: Diagnosis present

## 2023-05-27 DIAGNOSIS — R32 Unspecified urinary incontinence: Secondary | ICD-10-CM | POA: Insufficient documentation

## 2023-05-27 DIAGNOSIS — Z510 Encounter for antineoplastic radiation therapy: Secondary | ICD-10-CM | POA: Diagnosis present

## 2023-05-27 DIAGNOSIS — Z87891 Personal history of nicotine dependence: Secondary | ICD-10-CM | POA: Diagnosis not present

## 2023-05-28 ENCOUNTER — Other Ambulatory Visit: Payer: Self-pay

## 2023-05-28 ENCOUNTER — Ambulatory Visit
Admission: RE | Admit: 2023-05-28 | Discharge: 2023-05-28 | Disposition: A | Discharge status: Home / Self Care | Source: Ambulatory Visit | Attending: Radiation Oncology | Admitting: Radiation Oncology

## 2023-05-28 ENCOUNTER — Inpatient Hospital Stay

## 2023-05-28 DIAGNOSIS — R32 Unspecified urinary incontinence: Secondary | ICD-10-CM | POA: Insufficient documentation

## 2023-05-28 DIAGNOSIS — Z87891 Personal history of nicotine dependence: Secondary | ICD-10-CM | POA: Insufficient documentation

## 2023-05-28 DIAGNOSIS — C7951 Secondary malignant neoplasm of bone: Secondary | ICD-10-CM

## 2023-05-28 DIAGNOSIS — Z510 Encounter for antineoplastic radiation therapy: Secondary | ICD-10-CM | POA: Diagnosis not present

## 2023-05-28 DIAGNOSIS — C3411 Malignant neoplasm of upper lobe, right bronchus or lung: Secondary | ICD-10-CM

## 2023-05-28 LAB — RAD ONC ARIA SESSION SUMMARY
Course Elapsed Days: 0
Plan Fractions Treated to Date: 1
Plan Prescribed Dose Per Fraction: 3 Gy
Plan Total Fractions Prescribed: 10
Plan Total Prescribed Dose: 30 Gy
Reference Point Dosage Given to Date: 3 Gy
Reference Point Session Dosage Given: 3 Gy
Session Number: 1

## 2023-05-28 LAB — URINALYSIS, COMPLETE (UACMP) WITH MICROSCOPIC
Bacteria, UA: NONE SEEN
Bilirubin Urine: NEGATIVE
Glucose, UA: 50 mg/dL — AB
Hgb urine dipstick: NEGATIVE
Ketones, ur: NEGATIVE mg/dL
Nitrite: NEGATIVE
Protein, ur: 100 mg/dL — AB
Specific Gravity, Urine: 1.019 (ref 1.005–1.030)
pH: 5 (ref 5.0–8.0)

## 2023-05-29 ENCOUNTER — Other Ambulatory Visit: Payer: Self-pay

## 2023-05-29 ENCOUNTER — Ambulatory Visit
Admission: RE | Admit: 2023-05-29 | Discharge: 2023-05-29 | Disposition: A | Discharge status: Home / Self Care | Source: Ambulatory Visit | Attending: Radiation Oncology | Admitting: Radiation Oncology

## 2023-05-29 DIAGNOSIS — J9611 Chronic respiratory failure with hypoxia: Secondary | ICD-10-CM | POA: Insufficient documentation

## 2023-05-29 DIAGNOSIS — M7989 Other specified soft tissue disorders: Secondary | ICD-10-CM | POA: Insufficient documentation

## 2023-05-29 DIAGNOSIS — Z87891 Personal history of nicotine dependence: Secondary | ICD-10-CM | POA: Diagnosis not present

## 2023-05-29 DIAGNOSIS — Z5112 Encounter for antineoplastic immunotherapy: Secondary | ICD-10-CM | POA: Diagnosis present

## 2023-05-29 DIAGNOSIS — Z79899 Other long term (current) drug therapy: Secondary | ICD-10-CM | POA: Insufficient documentation

## 2023-05-29 DIAGNOSIS — C3411 Malignant neoplasm of upper lobe, right bronchus or lung: Secondary | ICD-10-CM | POA: Diagnosis present

## 2023-05-29 DIAGNOSIS — G893 Neoplasm related pain (acute) (chronic): Secondary | ICD-10-CM | POA: Insufficient documentation

## 2023-05-29 DIAGNOSIS — C7951 Secondary malignant neoplasm of bone: Secondary | ICD-10-CM | POA: Diagnosis not present

## 2023-05-29 LAB — RAD ONC ARIA SESSION SUMMARY
Course Elapsed Days: 1
Plan Fractions Treated to Date: 2
Plan Prescribed Dose Per Fraction: 3 Gy
Plan Total Fractions Prescribed: 10
Plan Total Prescribed Dose: 30 Gy
Reference Point Dosage Given to Date: 6 Gy
Reference Point Session Dosage Given: 3 Gy
Session Number: 2

## 2023-05-29 LAB — URINE CULTURE: Culture: <10000 — AB

## 2023-05-30 ENCOUNTER — Telehealth: Payer: Self-pay | Admitting: Radiation Oncology

## 2023-05-30 ENCOUNTER — Other Ambulatory Visit: Payer: Self-pay

## 2023-05-30 ENCOUNTER — Ambulatory Visit
Admission: RE | Admit: 2023-05-30 | Discharge: 2023-05-30 | Disposition: A | Discharge status: Home / Self Care | Source: Ambulatory Visit | Attending: Radiation Oncology | Admitting: Radiation Oncology

## 2023-05-30 DIAGNOSIS — Z5112 Encounter for antineoplastic immunotherapy: Secondary | ICD-10-CM | POA: Diagnosis not present

## 2023-05-30 LAB — RAD ONC ARIA SESSION SUMMARY
Course Elapsed Days: 2
Plan Fractions Treated to Date: 3
Plan Prescribed Dose Per Fraction: 3 Gy
Plan Total Fractions Prescribed: 10
Plan Total Prescribed Dose: 30 Gy
Reference Point Dosage Given to Date: 9 Gy
Reference Point Session Dosage Given: 3 Gy
Session Number: 3

## 2023-05-30 NOTE — Telephone Encounter (Addendum)
 5/2 Patient's daughter left voicemail need treatment appt change for Monday, 5/5.  Email forward to Support RTT and copied L4 machine so they are aware.

## 2023-06-02 ENCOUNTER — Other Ambulatory Visit: Payer: Self-pay

## 2023-06-02 ENCOUNTER — Ambulatory Visit
Admission: RE | Admit: 2023-06-02 | Discharge: 2023-06-02 | Disposition: A | Discharge status: Home / Self Care | Source: Ambulatory Visit | Attending: Radiation Oncology | Admitting: Radiation Oncology

## 2023-06-02 DIAGNOSIS — Z5112 Encounter for antineoplastic immunotherapy: Secondary | ICD-10-CM | POA: Diagnosis not present

## 2023-06-02 LAB — RAD ONC ARIA SESSION SUMMARY
Course Elapsed Days: 5
Plan Fractions Treated to Date: 4
Plan Prescribed Dose Per Fraction: 3 Gy
Plan Total Fractions Prescribed: 10
Plan Total Prescribed Dose: 30 Gy
Reference Point Dosage Given to Date: 12 Gy
Reference Point Session Dosage Given: 3 Gy
Session Number: 4

## 2023-06-03 ENCOUNTER — Ambulatory Visit
Admission: RE | Admit: 2023-06-03 | Discharge: 2023-06-03 | Disposition: A | Discharge status: Home / Self Care | Source: Ambulatory Visit | Attending: Radiation Oncology | Admitting: Radiation Oncology

## 2023-06-03 ENCOUNTER — Other Ambulatory Visit: Payer: Self-pay

## 2023-06-03 ENCOUNTER — Inpatient Hospital Stay (HOSPITAL_BASED_OUTPATIENT_CLINIC_OR_DEPARTMENT_OTHER): Admitting: Nurse Practitioner

## 2023-06-03 ENCOUNTER — Inpatient Hospital Stay: Payer: HMO

## 2023-06-03 ENCOUNTER — Inpatient Hospital Stay: Payer: HMO | Attending: Nurse Practitioner

## 2023-06-03 ENCOUNTER — Encounter

## 2023-06-03 ENCOUNTER — Encounter: Payer: Self-pay | Admitting: Nurse Practitioner

## 2023-06-03 ENCOUNTER — Inpatient Hospital Stay: Payer: HMO | Admitting: Internal Medicine

## 2023-06-03 VITALS — BP 148/86 | HR 95 | Temp 98.4°F | Resp 17 | Ht 70.0 in | Wt 170.6 lb

## 2023-06-03 DIAGNOSIS — Z95828 Presence of other vascular implants and grafts: Secondary | ICD-10-CM

## 2023-06-03 DIAGNOSIS — G893 Neoplasm related pain (acute) (chronic): Secondary | ICD-10-CM

## 2023-06-03 DIAGNOSIS — Z515 Encounter for palliative care: Secondary | ICD-10-CM | POA: Diagnosis not present

## 2023-06-03 DIAGNOSIS — C3411 Malignant neoplasm of upper lobe, right bronchus or lung: Secondary | ICD-10-CM | POA: Diagnosis not present

## 2023-06-03 DIAGNOSIS — R530 Neoplastic (malignant) related fatigue: Secondary | ICD-10-CM | POA: Diagnosis not present

## 2023-06-03 DIAGNOSIS — C7951 Secondary malignant neoplasm of bone: Secondary | ICD-10-CM | POA: Insufficient documentation

## 2023-06-03 DIAGNOSIS — Z5112 Encounter for antineoplastic immunotherapy: Secondary | ICD-10-CM | POA: Insufficient documentation

## 2023-06-03 DIAGNOSIS — Z87891 Personal history of nicotine dependence: Secondary | ICD-10-CM | POA: Insufficient documentation

## 2023-06-03 DIAGNOSIS — Z79899 Other long term (current) drug therapy: Secondary | ICD-10-CM | POA: Insufficient documentation

## 2023-06-03 LAB — CBC WITH DIFFERENTIAL (CANCER CENTER ONLY)
Abs Immature Granulocytes: 0.02 10*3/uL (ref 0.00–0.07)
Basophils Absolute: 0 10*3/uL (ref 0.0–0.1)
Basophils Relative: 1 %
Eosinophils Absolute: 0.1 10*3/uL (ref 0.0–0.5)
Eosinophils Relative: 2 %
HCT: 29.4 % — ABNORMAL LOW (ref 39.0–52.0)
Hemoglobin: 9.6 g/dL — ABNORMAL LOW (ref 13.0–17.0)
Immature Granulocytes: 0 %
Lymphocytes Relative: 20 %
Lymphs Abs: 1.6 10*3/uL (ref 0.7–4.0)
MCH: 31.1 pg (ref 26.0–34.0)
MCHC: 32.7 g/dL (ref 30.0–36.0)
MCV: 95.1 fL (ref 80.0–100.0)
Monocytes Absolute: 0.7 10*3/uL (ref 0.1–1.0)
Monocytes Relative: 9 %
Neutro Abs: 5.3 10*3/uL (ref 1.7–7.7)
Neutrophils Relative %: 68 %
Platelet Count: 207 10*3/uL (ref 150–400)
RBC: 3.09 MIL/uL — ABNORMAL LOW (ref 4.22–5.81)
RDW: 15.1 % (ref 11.5–15.5)
WBC Count: 7.7 10*3/uL (ref 4.0–10.5)
nRBC: 0 % (ref 0.0–0.2)

## 2023-06-03 LAB — CMP (CANCER CENTER ONLY)
ALT: 10 U/L (ref 0–44)
AST: 15 U/L (ref 15–41)
Albumin: 3.2 g/dL — ABNORMAL LOW (ref 3.5–5.0)
Alkaline Phosphatase: 122 U/L (ref 38–126)
Anion gap: 6 (ref 5–15)
BUN: 28 mg/dL — ABNORMAL HIGH (ref 8–23)
CO2: 28 mmol/L (ref 22–32)
Calcium: 8.6 mg/dL — ABNORMAL LOW (ref 8.9–10.3)
Chloride: 105 mmol/L (ref 98–111)
Creatinine: 2.75 mg/dL — ABNORMAL HIGH (ref 0.61–1.24)
GFR, Estimated: 23 mL/min — ABNORMAL LOW (ref >60)
Glucose, Bld: 101 mg/dL — ABNORMAL HIGH (ref 70–99)
Potassium: 4.7 mmol/L (ref 3.5–5.1)
Sodium: 139 mmol/L (ref 135–145)
Total Bilirubin: 0.4 mg/dL (ref 0.0–1.2)
Total Protein: 7 g/dL (ref 6.5–8.1)

## 2023-06-03 LAB — RAD ONC ARIA SESSION SUMMARY
Course Elapsed Days: 6
Plan Fractions Treated to Date: 5
Plan Prescribed Dose Per Fraction: 3 Gy
Plan Total Fractions Prescribed: 10
Plan Total Prescribed Dose: 30 Gy
Reference Point Dosage Given to Date: 15 Gy
Reference Point Session Dosage Given: 3 Gy
Session Number: 5

## 2023-06-03 MED ORDER — SODIUM CHLORIDE 0.9 % IV SOLN
360.0000 mg | Freq: Once | INTRAVENOUS | Status: AC
  Administered 2023-06-03: 360 mg via INTRAVENOUS
  Filled 2023-06-03: qty 12

## 2023-06-03 MED ORDER — SODIUM CHLORIDE 0.9 % IV SOLN
INTRAVENOUS | Status: DC
Start: 2023-06-03 — End: 2023-06-03

## 2023-06-03 MED ORDER — HYDROCODONE-ACETAMINOPHEN 5-325 MG PO TABS
1.0000 | ORAL_TABLET | Freq: Four times a day (QID) | ORAL | 0 refills | Status: AC | PRN
Start: 2023-06-03 — End: ?

## 2023-06-03 MED ORDER — SODIUM CHLORIDE 0.9% FLUSH
10.0000 mL | Freq: Once | INTRAVENOUS | Status: AC
Start: 2023-06-03 — End: 2023-06-03
  Administered 2023-06-03: 10 mL

## 2023-06-03 NOTE — Progress Notes (Signed)
 [facility] Telephone:([phone]   Fax:([phone]) [phone]  OFFICE PROGRESS NOTE  Alfredia Ina, MD [address] [place] [address]  DIAGNOSIS: Stage IV (T2b, N3, M1b) non-small cell lung cancer, adenocarcinoma . This was diagnosed in December 2024 and presented with large right apical lung mass in addition to right hilar, mediastinal and supraclavicular lymphadenopathy as well as pulmonary nodules in the right upper lobe and right lower lobe with T10 bone metastasis in addition to medial left inguinal lymph node versus subcutaneous nodule.   Biomarker Findings HRD signature - HRDsig Negative Microsatellite status - Cannot Be Determined ? Tumor Mutational Burden - 8 Muts/Mb Genomic Findings For a complete list of the genes assayed, please refer to the Appendix. CDKN2A loss KEAP1 K6fs*60 STK11 E291* CDKN2B loss IKZF1 R111* TP53 R280K 8 Disease relevant genes with no reportable alterations: ALK, BRAF, EGFR, ERBB2, KRAS, MET, RET, ROS1  PRIOR THERAPY: Palliative radiotherapy to the T10 bone metastasis.  CURRENT THERAPY: First-line treatment with chemoimmunotherapy in the form of carboplatin  for AUC of 5 and paclitaxel  175 Mg/M2 on days 1 and 22 in addition to ipilimumab  1 Mg/KG on day 1 and nivolumab  360 Mg IV on days 1 and 22 for 1 cycle followed by maintenance treatment with ipilimumab  1 Mg/KG every 6 weeks and nivolumab  360 Mg IV every 3 weeks.  First dose February 18, 2023.  Chemotherapy was discontinued starting from day 22 of cycle #1 secondary to intolerance.  INTERVAL HISTORY: Sully H Carolan 78 y.o. male returns to the clinic today for follow-up visit accompanied by his daughter.  Discussed the use of AI scribe software for clinical note transcription with the patient, who gave verbal consent to proceed.  History of Present Illness   Tavoris H Britz is an 78 year old male undergoing treatment for cancer who presents for evaluation before  starting day twenty-two of cycle number three. He is accompanied by his daughter.  He generally tolerates immunotherapy well without significant illness, although he experiences daily nausea. Radiation treatment is causing his legs to feel very tired and hurt, particularly due to the treatment of bone lesions in his thighs.  He has a history of easy bruising and skin tears, which is exacerbated by minor injuries such as a scratch from his dog. He is not on any blood thinners, only taking a low-dose aspirin  (81 mg). His hemoglobin levels are improving, currently at 9.6 or 9.7, indicating ongoing anemia but with some improvement.  No diarrhea, abdominal pain, chest pain, or breathing issues. His cough has been less frequent recently.         MEDICAL HISTORY: Past Medical History:  Diagnosis Date   Acid reflux    Arrhythmia    Arthritis    Cataract    bilateral cataract extraction with intraoccular lens implants   Chest pain, unspecified    CHF (congestive heart failure) (HCC)    Chronic airway obstruction, not elsewhere classified    PT. DENIES HE HAS COPD   CKD (chronic kidney disease)    Colon polyps    Adenomatous Polyps 2007   Depression    Diabetes mellitus    Diverticulosis    Gout    Heart murmur    History of kidney stones    History of radiation therapy    Thoracic Spine- 01/27/23-02/10/23- Dr. Retta Caster   Hyperlipemia    Hypertension    Lung cancer (HCC) 12/2022   Nephrolithiasis  Normocytic anemia 04/23/2017   Obstructive sleep apnea (adult) (pediatric)    Other and unspecified hyperlipidemia    Other left bundle branch block    Retinal vascular occlusion, unspecified    right   Shortness of breath    on exertion   Unspecified sleep apnea    Vision loss of right eye    White coat syndrome with diagnosis of hypertension 09/19/2017    ALLERGIES:  is allergic to amlodipine , penicillins, and hydralazine .  MEDICATIONS:  Current Outpatient Medications   Medication Sig Dispense Refill   aspirin  EC 325 MG tablet Take 1 tablet (325 mg total) by mouth daily. 30 tablet 0   atorvastatin  (LIPITOR) 40 MG tablet Take 40 mg by mouth at bedtime.     buPROPion  (WELLBUTRIN  XL) 150 MG 24 hr tablet Take 150 mg by mouth every morning.     Dulaglutide (TRULICITY) 1.5 MG/0.5ML SOAJ Inject 1.5 mg into the skin daily. Takes on Thursdays     famotidine  (PEPCID ) 20 MG tablet Take 1 tablet (20 mg total) by mouth 2 (two) times daily. (Patient taking differently: Take 20 mg by mouth daily.) 60 tablet 3   gabapentin  (NEURONTIN ) 100 MG capsule Take 1 capsule (100 mg total) by mouth daily.     HYDROcodone -acetaminophen  (NORCO/VICODIN) 5-325 MG tablet Take 1 tablet by mouth every 6 (six) hours as needed for moderate pain (pain score 4-6). 45 tablet 0   insulin  glargine (LANTUS ) 100 UNIT/ML injection Inject 41 Units into the skin in the morning.     loratadine  (CLARITIN ) 10 MG tablet Take 10 mg by mouth daily.     LORazepam  (ATIVAN ) 0.5 MG tablet Take 1 tablet (0.5 mg total) by mouth every 8 (eight) hours as needed for anxiety. 0.5 mg p.o. every 8 hours as needed for anxiety/nausea. 30 tablet 0   ondansetron  (ZOFRAN ) 4 MG tablet Take 1 tablet (4 mg total) by mouth every 8 (eight) hours as needed for nausea or vomiting. 30 tablet 2   OXYGEN Inhale 1.5 L into the lungs daily as needed (Shortness of breath).     prochlorperazine  (COMPAZINE ) 10 MG tablet Take 1 tablet (10 mg total) by mouth every 6 (six) hours as needed for nausea or vomiting. (Patient not taking: Reported on 05/21/2023) 30 tablet 1   sertraline  (ZOLOFT ) 100 MG tablet Take 100 mg by mouth in the morning.     torsemide  (DEMADEX ) 20 MG tablet Take 2 tablets (40 mg total) by mouth daily. (Patient not taking: Reported on 03/27/2023) 60 tablet 0   No current facility-administered medications for this visit.    SURGICAL HISTORY:  Past Surgical History:  Procedure Laterality Date   ANTERIOR CERVICAL  DECOMP/DISCECTOMY FUSION N/A 06/26/2016   Procedure: Re-Exploration of Cervical Wound;  Surgeon: Gearl Keens, MD;  Location: [facility] OR;  Service: Neurosurgery;  Laterality: N/A;   ANTERIOR CERVICAL DECOMP/DISCECTOMY FUSION N/A 06/26/2016   Procedure: ANTERIOR CERVICAL DECOMPRESSION/DISCECTOMY FUSION, INTERBODY PROSTESIS, PLATE, CERVICAL THREE CERVICAL FOUR, CERVICAL FOUR CERVICAL FIVE CERVICAL SIX;  Surgeon: Garry Kansas, MD;  Location[facility] OR;  Service: Neurosurgery;  Laterality: N/A;   APPLICATION OF WOUND VAC  06/05/2020   Procedure: APPLICATION OF WOUND VAC;  Surgeon: Garry Kansas, MD;  Location: [facility] OR;  Service: Neurosurgery;;   BACK SURGERY     CATARACT EXTRACTION Bilateral    FEMUR IM NAIL Right 05/06/2023   Procedure: RIGHT FEMUR NAILING;  Surgeon: Wilhelmenia Harada, MD;  Location: [facility] OR;  Service: Orthopedics;  Laterality: Right;  RIGHT INTERTROCHANTERIC  INTRAMEDULLARY HIP NAIL   IR IMAGING GUIDED PORT INSERTION  02/19/2023   KIDNEY STONE SURGERY     KYPHOPLASTY N/A 01/19/2018   Procedure: KYPHOPLASTY LUMBAR THREE;  Surgeon: Garry Kansas, MD;  Location: [facility] OR;  Service: Neurosurgery;  Laterality: N/A;  KYPHOPLASTY LUMBAR THREE   LUMBAR WOUND DEBRIDEMENT N/A 06/05/2020   Procedure: LUMBAR WOUND DEBRIDEMENT;  Surgeon: Garry Kansas, MD;  Location: [facility] OR;  Service: Neurosurgery;  Laterality: N/A;    REVIEW OF SYSTEMS:  Constitutional: positive for fatigue Eyes: negative Ears, nose, mouth, throat, and face: negative Respiratory: negative Cardiovascular: negative Gastrointestinal: positive for nausea Genitourinary:negative Integument/breast: negative Hematologic/lymphatic: negative Musculoskeletal:positive for arthralgias and bone pain Neurological: negative Behavioral/Psych: negative Endocrine: negative Allergic/Immunologic: negative   PHYSICAL EXAMINATION: General appearance: alert, cooperative, fatigued, and no distress Head: Normocephalic, without obvious abnormality,  atraumatic Neck: no adenopathy, no JVD, supple, symmetrical, trachea midline, and thyroid  not enlarged, symmetric, no tenderness/mass/nodules Lymph nodes: Cervical, supraclavicular, and axillary nodes normal. Resp: clear to auscultation bilaterally Back: symmetric, no curvature. ROM normal. No CVA tenderness. Cardio: regular rate and rhythm, S1, S2 normal, no murmur, click, rub or gallop GI: soft, non-tender; bowel sounds normal; no masses,  no organomegaly Extremities: extremities normal, atraumatic, no cyanosis or edema Neurologic: Alert and oriented X 3, normal strength and tone. Normal symmetric reflexes. Normal coordination and gait  ECOG PERFORMANCE STATUS: 1 - Symptomatic but completely ambulatory  Blood pressure (!) 148/86, pulse 95, temperature 98.4 F (36.9 C), temperature source Temporal, resp. rate 17, height 5\' 10"  (1.778 m), weight 170 lb 9.6 oz (77.4 kg), SpO2 98%.  LABORATORY DATA: Lab Results  Component Value Date   WBC 8.3 05/13/2023   HGB 8.7 (L) 05/13/2023   HCT 26.8 (L) 05/13/2023   MCV 94.7 05/13/2023   PLT 240 05/13/2023      Chemistry      Component Value Date/Time   NA 137 05/13/2023 1036   NA 142 07/03/2022 1356   K 3.9 05/13/2023 1036   CL 105 05/13/2023 1036   CO2 26 05/13/2023 1036   BUN 29 (H) 05/13/2023 1036   BUN 22 07/03/2022 1356   CREATININE 2.50 (H) 05/13/2023 1036      Component Value Date/Time   CALCIUM  8.8 (L) 05/13/2023 1036   ALKPHOS 121 05/13/2023 1036   AST 15 05/13/2023 1036   ALT 5 05/13/2023 1036   BILITOT 0.7 05/13/2023 1036       RADIOGRAPHIC STUDIES: DG FEMUR, MIN 2 VIEWS RIGHT Result Date: 05/21/2023 CLINICAL DATA:  Postop. EXAM: RIGHT FEMUR 2 VIEWS COMPARISON:  Preoperative imaging FINDINGS: Femoral intramedullary nail with trans trochanteric and distal locking screw fixation. Lucent lesion involving the mid shaft lateral cortex, without significant change from prior. No evidence of acute or pathologic fracture.  IMPRESSION: Femoral intramedullary nail fixation without evidence of complication. Electronically Signed   By: Chadwick Colonel M.D.   On: 05/21/2023 11:09   DG FEMUR, MIN 2 VIEWS RIGHT Result Date: 05/07/2023 CLINICAL DATA:  Elective surgery. EXAM: RIGHT FEMUR 2 VIEWS COMPARISON:  Preoperative imaging FINDINGS: Seven fluoroscopic spot views of the right femur submitted from the operating room. Femoral intramedullary nail with trans trochanteric and distal locking screw fixation. Fluoroscopy time 1 minutes 32 seconds. Dose 7.1 mGy. IMPRESSION: Intraoperative fluoroscopy during right femoral intramedullary nail fixation. Electronically Signed   By: Chadwick Colonel M.D.   On: 05/07/2023 00:10   DG C-Arm 1-60 Min-No Report Result Date: 05/06/2023 Fluoroscopy was utilized by the requesting physician.  No radiographic  interpretation.    ASSESSMENT AND PLAN: This is a very pleasant 78 years old white male with  Stage IV (T2b, N3, M1b) non-small cell lung cancer, adenocarcinoma . This was diagnosed in December 2024 and presented with large right apical lung mass in addition to right hilar, mediastinal and supraclavicular lymphadenopathy as well as pulmonary nodules in the right upper lobe and right lower lobe with T10 bone metastasis in addition to medial left inguinal lymph node versus subcutaneous nodule.  Molecular studies showed no actionable mutation and there was insufficient material for PD-L1 expression. The patient status post palliative radiotherapy to the T10 vertebral body lesion. The patient is currently undergoing first-line treatment with chemoimmunotherapy in the form of carboplatin  for AUC of 5 and paclitaxel  175 Mg/M2 on days 1 and 22 in addition to ipilimumab  1 Mg/KG on day 1 and nivolumab  360 Mg IV on days 1 and 22 for 1 cycle followed by maintenance treatment with ipilimumab  1 Mg/KG every 6 weeks and nivolumab  360 Mg IV every 3 weeks.  First dose February 18, 2023.  He is status post 1 cycle.   He has a rough time with his treatment.  He had a rough time with the first cycle of his treatment with chemotherapy which will be discontinued starting from day 22 of cycle #1.  The patient will continue on immunotherapy only with ipilimumab  1 Mg/Kg every 6 weeks in addition to nivolumab  360 Mg IV every 3 weeks.  He is here today for day 22 of cycle #3.     Stage IV (T2b, N3, M1b) non-small cell lung cancer, adenocarcinoma . This was diagnosed in December 2024 and presented with large right apical lung mass in addition to right hilar, mediastinal and supraclavicular lymphadenopathy as well as pulmonary nodules in the right upper lobe and right lower lobe with T10 bone metastasis in addition to medial left inguinal lymph node versus subcutaneous nodule.   Currently on Immunotherapy with Ipilimumab  and Nivolumab , day 22 of cycle #3   Cancer with bone lesions The cancer shows minor changes on the CT scan, with a slight decrease in the size of the main mass. Hazy areas in the lung could indicate lymphogenic spread, but this is uncertain and could also be due to inflammation or post-obstructive pneumonia. The decision to continue treatment is based on the stability of the main mass and the uncertain nature of the lung findings, which could be inflammation rather than cancer spread. - Proceed with day 22 of cycle 3 of immunotherapy. - Perform another scan after cycle 4 to assess progress.  Fatigue and leg pain due to radiation Fatigue and leg pain are attributed to radiation treatment targeting bone lesions in the thighs, causing significant discomfort and tiredness.  Anemia Hemoglobin levels are improving, currently at 9.6 or 9.7, indicating a positive trend in anemia management.  Nausea Nausea is a persistent daily issue, but it is not exacerbated by immunotherapy. No additional gastrointestinal symptoms such as diarrhea or abdominal pain are reported.  Follow-up - Schedule follow-up appointment  in three weeks.    The patient voices understanding of current disease status and treatment options and is in agreement with the current care plan.  All questions were answered. The patient knows to call the clinic with any problems, questions or concerns. We can certainly see the patient much sooner if necessary.  The total time spent in the appointment was 30 minutes.  Disclaimer: This note was dictated with voice recognition software. Similar sounding words  can inadvertently be transcribed and may not be corrected upon review.

## 2023-06-03 NOTE — Patient Instructions (Signed)
 [facility] - A DEPT OF [facility]  Discharge Instructions: Thank you for choosing [facility] to provide your oncology and hematology care.   If you have a lab appointment with the Cancer Center, please go directly to the Cancer Center and check in at the registration area.   Wear comfortable clothing and clothing appropriate for easy access to any Portacath or PICC line.   We strive to give you quality time with your provider. You may need to reschedule your appointment if you arrive late (15 or more minutes).  Arriving late affects you and other patients whose appointments are after yours.  Also, if you miss three or more appointments without notifying the office, you may be dismissed from the clinic at the provider's discretion.      For prescription refill requests, have your pharmacy contact our office and allow 72 hours for refills to be completed.    Today you received the following chemotherapy and/or immunotherapy agents opdivo      To help prevent nausea and vomiting after your treatment, we encourage you to take your nausea medication as directed.  BELOW ARE SYMPTOMS THAT SHOULD BE REPORTED IMMEDIATELY: *FEVER GREATER THAN 100.4 F (38 C) OR HIGHER *CHILLS OR SWEATING *NAUSEA AND VOMITING THAT IS NOT CONTROLLED WITH YOUR NAUSEA MEDICATION *UNUSUAL SHORTNESS OF BREATH *UNUSUAL BRUISING OR BLEEDING *URINARY PROBLEMS (pain or burning when urinating, or frequent urination) *BOWEL PROBLEMS (unusual diarrhea, constipation, pain near the anus) TENDERNESS IN MOUTH AND THROAT WITH OR WITHOUT PRESENCE OF ULCERS (sore throat, sores in mouth, or a toothache) UNUSUAL RASH, SWELLING OR PAIN  UNUSUAL VAGINAL DISCHARGE OR ITCHING   Items with * indicate a potential emergency and should be followed up as soon as possible or go to the Emergency Department if any problems should occur.  Please show the CHEMOTHERAPY ALERT CARD or IMMUNOTHERAPY  ALERT CARD at check-in to the Emergency Department and triage nurse.  Should you have questions after your visit or need to cancel or reschedule your appointment, please contact [facility] - A DEPT OF [facility]  Dept: [phone]  and follow the prompts.  Office hours are 8:00 a.m. to 4:30 p.m. Monday - Friday. Please note that voicemails left after 4:00 p.m. may not be returned until the following business day.  We are closed weekends and major holidays. You have access to a nurse at all times for urgent questions. Please call the main number to the clinic Dept: [phone] and follow the prompts.   For any non-urgent questions, you may also contact your provider using MyChart. We now offer e-Visits for anyone 74 and older to request care online for non-urgent symptoms. For details visit [email or web address].   Also download the MyChart app! Go to the app store, search "MyChart", open the app, select [facility], and log in with your MyChart username and password.

## 2023-06-03 NOTE — Progress Notes (Signed)
 Palliative Medicine [facility]  Telephone:(336) [phone] [phone]   Name: Jeffery Fernandez Date: 06/03/2023 MRN: [id number]  DOB: 10/02/1945  Patient Care Team: Alfredia Ina, MD as PCP - General (Family Medicine) Maudine Sos, MD as PCP - Cardiology (Cardiology) Pickenpack-Cousar, Giles Labrum, NP as Nurse Practitioner (Hospice and Palliative Medicine)    INTERVAL HISTORY: Jeffery Fernandez is a 78 y.o. 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 As well as COPD, DM type 2, arthritis, hypertension, and hyperlipidemia. Palliative ask to see for symptom management and goals of care.   SOCIAL HISTORY:     reports that he quit smoking about 37 years ago. His smoking use included cigarettes. He started smoking about 62 years ago. He has a 37.5 pack-year smoking history. He has never used smokeless tobacco. He reports that he does not currently use alcohol. He reports that he does not use drugs.  ADVANCE DIRECTIVES:  Advanced directives on file name Jeffery Fernandez as the primary and Jeffery Fernandez as secondary decision makers should the patient become unable to make his own decisions.  CODE STATUS: Full code  PAST MEDICAL HISTORY: Past Medical History:  Diagnosis Date   Acid reflux    Arrhythmia    Arthritis    Cataract    bilateral cataract extraction with intraoccular lens implants   Chest pain, unspecified    CHF (congestive heart failure) (HCC)    Chronic airway obstruction, not elsewhere classified    PT. DENIES HE HAS COPD   CKD (chronic kidney disease)    Colon polyps    Adenomatous Polyps 2007   Depression    Diabetes mellitus    Diverticulosis    Gout    Heart murmur    History of kidney stones    History of radiation therapy    Thoracic Spine- 01/27/23-02/10/23- Dr. Retta Caster   Hyperlipemia    Hypertension    Lung cancer (HCC) 12/2022   Nephrolithiasis    Normocytic anemia 04/23/2017    Obstructive sleep apnea (adult) (pediatric)    Other and unspecified hyperlipidemia    Other left bundle branch block    Retinal vascular occlusion, unspecified    right   Shortness of breath    on exertion   Unspecified sleep apnea    Vision loss of right eye    White coat syndrome with diagnosis of hypertension 09/19/2017    ALLERGIES:  is allergic to amlodipine , penicillins, and hydralazine .  MEDICATIONS:  Current Outpatient Medications  Medication Sig Dispense Refill   aspirin  EC 325 MG tablet Take 1 tablet (325 mg total) by mouth daily. 30 tablet 0   atorvastatin  (LIPITOR) 40 MG tablet Take 40 mg by mouth at bedtime.     buPROPion  (WELLBUTRIN  XL) 150 MG 24 hr tablet Take 150 mg by mouth every morning.     Dulaglutide (TRULICITY) 1.5 MG/0.5ML SOAJ Inject 1.5 mg into the skin daily. Takes on Thursdays     famotidine  (PEPCID ) 20 MG tablet Take 1 tablet (20 mg total) by mouth 2 (two) times daily. (Patient taking differently: Take 20 mg by mouth daily.) 60 tablet 3   gabapentin  (NEURONTIN ) 100 MG capsule Take 1 capsule (100 mg total) by mouth daily.     HYDROcodone -acetaminophen  (NORCO/VICODIN) 5-325 MG tablet Take 1 tablet by mouth every 6 (six) hours as needed for moderate pain (pain score 4-6). 45 tablet 0   insulin  glargine (LANTUS ) 100 UNIT/ML injection Inject 41  Units into the skin in the morning.     loratadine  (CLARITIN ) 10 MG tablet Take 10 mg by mouth daily.     LORazepam  (ATIVAN ) 0.5 MG tablet Take 1 tablet (0.5 mg total) by mouth every 8 (eight) hours as needed for anxiety. 0.5 mg p.o. every 8 hours as needed for anxiety/nausea. 30 tablet 0   ondansetron  (ZOFRAN ) 4 MG tablet Take 1 tablet (4 mg total) by mouth every 8 (eight) hours as needed for nausea or vomiting. 30 tablet 2   OXYGEN Inhale 1.5 L into the lungs daily as needed (Shortness of breath).     prochlorperazine  (COMPAZINE ) 10 MG tablet Take 1 tablet (10 mg total) by mouth every 6 (six) hours as needed for nausea or  vomiting. (Patient not taking: Reported on 05/21/2023) 30 tablet 1   sertraline  (ZOLOFT ) 100 MG tablet Take 100 mg by mouth in the morning.     torsemide  (DEMADEX ) 20 MG tablet Take 2 tablets (40 mg total) by mouth daily. (Patient not taking: Reported on 03/27/2023) 60 tablet 0   No current facility-administered medications for this visit.   Facility-Administered Medications Ordered in Other Visits  Medication Dose Route Frequency Provider Last Rate Last Admin   0.9 %  sodium chloride  infusion   Intravenous Continuous Marlene Simas, MD   Stopped at 06/03/23 1543    VITAL SIGNS: There were no vitals taken for this visit. There were no vitals filed for this visit.  Estimated body mass index is 24.48 kg/m as calculated from the following:   Height as of an earlier encounter on 06/03/23: 5\' 10"  (1.778 m).   Weight as of an earlier encounter on 06/03/23: 170 lb 9.6 oz (77.4 kg).   PERFORMANCE STATUS (ECOG) : 1 - Symptomatic but completely ambulatory  Physical Exam General: NAD Cardiovascular: regular rate and rhythm Pulmonary: normal breathing pattern  Extremities: no edema, no joint deformities Skin: no rashes Neurological: AAOx3  Discussed the use of AI scribe software for clinical note transcription with the patient, who gave verbal consent to proceed.  History of Present Illness Jeffery Fernandez is an 78 year old male who was seen during his infusion for symptom management follow-up. No acute distress noted.  Accompanied by his daughter.  His appetite is reported to be good, and he is eating well. Sleep patterns remain consistent, with no significant changes noted.  Denies concerns of uncontrolled nausea, vomiting, constipation, or diarrhea.  Take Zofran  as needed.  He tries to remain as active as possible.  Endorses increasing fatigue which he attributes to current radiation treatments.  Jeffery Fernandez is experiencing significant pain in his leg, which is the primary area of discomfort. The  pain is currently managed with hydrocodone , taken as needed.  He does not require medication around-the-clock however does take at least once or twice daily.  No adjustments to current regimen at this time.  All questions answered and support provided.  I discussed the importance of continued conversation with family and their medical providers regarding overall plan of care and treatment options, ensuring decisions are within the context of the patients values and GOCs.  Assessment & Plan Cancer related pain/metastatic bone disease Pain is controlled with hydrocodone .  No adjustments to regimen at this time. - Continue hydrocodone  1.5-2 tablets every 4-6 hours as needed for pain management. -  Zofran  as needed for nausea. - Continue gabapentin  daily - Will continue to closely monitor and support.  Radiation therapy for leg Scheduled radiation therapy as  part of treatment plan post-surgery per oncology. - Tolerating radiation however with some fatigue.  I will plan to see patient back in 2-3 weeks.  Sooner if needed.  Patient expressed understanding and was in agreement with this plan. He also understands that He can call the clinic at any time with any questions, concerns, or complaints.   Any controlled substances utilized were prescribed in the context of palliative care. PDMP has been reviewed.   Visit consisted of counseling and education dealing with the complex and emotionally intense issues of symptom management and palliative care in the setting of serious and potentially life-threatening illness.  Dellia Ferguson, AGPCNP-BC  Palliative Medicine Team/[facility]

## 2023-06-03 NOTE — Progress Notes (Signed)
Per Dr. Arbutus Ped, ok to treat with elevated creatinine.

## 2023-06-04 ENCOUNTER — Other Ambulatory Visit: Payer: Self-pay

## 2023-06-04 ENCOUNTER — Ambulatory Visit
Admission: RE | Admit: 2023-06-04 | Discharge: 2023-06-04 | Disposition: A | Discharge status: Home / Self Care | Source: Ambulatory Visit | Attending: Radiation Oncology | Admitting: Radiation Oncology

## 2023-06-04 DIAGNOSIS — Z5112 Encounter for antineoplastic immunotherapy: Secondary | ICD-10-CM | POA: Diagnosis not present

## 2023-06-04 LAB — RAD ONC ARIA SESSION SUMMARY
Course Elapsed Days: 7
Plan Fractions Treated to Date: 6
Plan Prescribed Dose Per Fraction: 3 Gy
Plan Total Fractions Prescribed: 10
Plan Total Prescribed Dose: 30 Gy
Reference Point Dosage Given to Date: 18 Gy
Reference Point Session Dosage Given: 3 Gy
Session Number: 6

## 2023-06-05 ENCOUNTER — Other Ambulatory Visit: Payer: Self-pay

## 2023-06-05 ENCOUNTER — Ambulatory Visit
Admission: RE | Admit: 2023-06-05 | Discharge: 2023-06-05 | Disposition: A | Discharge status: Home / Self Care | Source: Ambulatory Visit | Attending: Radiation Oncology | Admitting: Radiation Oncology

## 2023-06-05 DIAGNOSIS — Z5112 Encounter for antineoplastic immunotherapy: Secondary | ICD-10-CM | POA: Diagnosis not present

## 2023-06-05 LAB — RAD ONC ARIA SESSION SUMMARY
Course Elapsed Days: 8
Plan Fractions Treated to Date: 7
Plan Prescribed Dose Per Fraction: 3 Gy
Plan Total Fractions Prescribed: 10
Plan Total Prescribed Dose: 30 Gy
Reference Point Dosage Given to Date: 19.7007 Gy
Reference Point Session Dosage Given: 1.7007 Gy
Session Number: 7

## 2023-06-06 ENCOUNTER — Other Ambulatory Visit: Payer: Self-pay

## 2023-06-06 ENCOUNTER — Ambulatory Visit
Admission: RE | Admit: 2023-06-06 | Discharge: 2023-06-06 | Disposition: A | Discharge status: Home / Self Care | Source: Ambulatory Visit | Attending: Radiation Oncology | Admitting: Radiation Oncology

## 2023-06-06 DIAGNOSIS — Z5112 Encounter for antineoplastic immunotherapy: Secondary | ICD-10-CM | POA: Diagnosis not present

## 2023-06-06 LAB — RAD ONC ARIA SESSION SUMMARY
Course Elapsed Days: 9
Course Elapsed Days: 9
Plan Fractions Treated to Date: 7
Plan Fractions Treated to Date: 8
Plan Prescribed Dose Per Fraction: 3 Gy
Plan Prescribed Dose Per Fraction: 3 Gy
Plan Total Fractions Prescribed: 10
Plan Total Fractions Prescribed: 10
Plan Total Prescribed Dose: 30 Gy
Plan Total Prescribed Dose: 30 Gy
Reference Point Dosage Given to Date: 21 Gy
Reference Point Dosage Given to Date: 24 Gy
Reference Point Session Dosage Given: 1.2993 Gy
Reference Point Session Dosage Given: 3 Gy
Session Number: 8
Session Number: 9

## 2023-06-09 ENCOUNTER — Other Ambulatory Visit: Payer: Self-pay

## 2023-06-09 ENCOUNTER — Ambulatory Visit
Admission: RE | Admit: 2023-06-09 | Discharge: 2023-06-09 | Disposition: A | Discharge status: Home / Self Care | Source: Ambulatory Visit | Attending: Radiation Oncology | Admitting: Radiation Oncology

## 2023-06-09 DIAGNOSIS — Z5112 Encounter for antineoplastic immunotherapy: Secondary | ICD-10-CM | POA: Diagnosis not present

## 2023-06-09 LAB — RAD ONC ARIA SESSION SUMMARY
Course Elapsed Days: 12
Plan Fractions Treated to Date: 9
Plan Prescribed Dose Per Fraction: 3 Gy
Plan Total Fractions Prescribed: 10
Plan Total Prescribed Dose: 30 Gy
Reference Point Dosage Given to Date: 27 Gy
Reference Point Session Dosage Given: 3 Gy
Session Number: 10

## 2023-06-10 ENCOUNTER — Ambulatory Visit
Admission: RE | Admit: 2023-06-10 | Discharge: 2023-06-10 | Disposition: A | Discharge status: Home / Self Care | Source: Ambulatory Visit | Attending: Radiation Oncology | Admitting: Radiation Oncology

## 2023-06-10 ENCOUNTER — Other Ambulatory Visit: Payer: Self-pay

## 2023-06-10 ENCOUNTER — Other Ambulatory Visit: Payer: HMO

## 2023-06-10 DIAGNOSIS — M7989 Other specified soft tissue disorders: Secondary | ICD-10-CM

## 2023-06-10 DIAGNOSIS — J9611 Chronic respiratory failure with hypoxia: Secondary | ICD-10-CM

## 2023-06-10 DIAGNOSIS — Z5112 Encounter for antineoplastic immunotherapy: Secondary | ICD-10-CM | POA: Diagnosis not present

## 2023-06-10 LAB — RAD ONC ARIA SESSION SUMMARY
Course Elapsed Days: 13
Plan Fractions Treated to Date: 10
Plan Prescribed Dose Per Fraction: 3 Gy
Plan Total Fractions Prescribed: 10
Plan Total Prescribed Dose: 30 Gy
Reference Point Dosage Given to Date: 30 Gy
Reference Point Session Dosage Given: 3 Gy
Session Number: 11

## 2023-06-11 ENCOUNTER — Ambulatory Visit (HOSPITAL_COMMUNITY)
Admission: RE | Admit: 2023-06-11 | Discharge: 2023-06-11 | Disposition: A | Discharge status: Home / Self Care | Source: Ambulatory Visit | Attending: Vascular Surgery | Admitting: Vascular Surgery

## 2023-06-11 ENCOUNTER — Telehealth: Payer: Self-pay | Admitting: *Deleted

## 2023-06-11 ENCOUNTER — Encounter: Payer: Self-pay | Admitting: Radiology

## 2023-06-11 ENCOUNTER — Telehealth: Payer: Self-pay

## 2023-06-11 DIAGNOSIS — M7989 Other specified soft tissue disorders: Secondary | ICD-10-CM | POA: Insufficient documentation

## 2023-06-11 NOTE — Telephone Encounter (Signed)
 Call placed to make patient aware of doppler results. No DVT, daughter voiced understanding.

## 2023-06-11 NOTE — Telephone Encounter (Signed)
 CALLED PATIENT'S DAUGHTER- TERESA TO INFORM OF HER DAD'S DOPPLER TODAY (06/11/23) @ 10 AM - ADDRESS- [address] SPOKE WITH PATIENT'S DAUGHTER- TERESA AND SHE IS AWARE OF THIS SCAN AND THE INSTRUCTIONS

## 2023-06-11 NOTE — Radiation Completion Notes (Signed)
 Patient Name: Jeffery Fernandez, Jeffery Fernandez MRN: [id number] Date of Birth: Feb 17, 1945 Referring Physician: Wilhelmenia Harada, M.D. Date of Service: 2023-06-11 Radiation Oncologist: Asberry Bjornstad, M.D. [facility] [place]                             RADIATION ONCOLOGY END OF TREATMENT NOTE     Diagnosis: C79.51 Secondary malignant neoplasm of bone Staging on 2023-01-28: Primary adenocarcinoma of upper lobe of right lung (HCC) T=cT2b, N=cN3, M=cM1b Intent: Palliative     ==========DELIVERED PLANS==========  First Treatment Date: 2023-05-28 Last Treatment Date: 2023-06-10   Plan Name: Ext_Femur_R_F Site: Femur Right Technique: Isodose Plan Mode: Photon Dose Per Fraction: 3 Gy Prescribed Dose (Delivered / Prescribed): 30 Gy / 30 Gy Prescribed Fxs (Delivered / Prescribed): 10 / 10     ==========ON TREATMENT VISIT DATES========== 2023-06-03, 2023-06-10     ==========UPCOMING VISITS========== 07/16/2023 [facility] PALLIATIVE CARE    [facility] PALLIATIVE CARE  07/16/2023 [facility] PORT FLUSH W/LAB [facility] FLUSH  07/16/2023 [facility] EST PT 30 Heilingoetter, Cassandra L, PA-C  07/16/2023 [facility] INFUSION 1HR30MIN (90) [facility] INFUSION  07/07/2023 [facility] ONC FOLLOW UP 15 Retta Caster, MD  06/24/2023 [facility] PALLIATIVE CARE    [facility] PALLIATIVE CARE  06/24/2023 [facility] PORT FLUSH W/LAB [facility] FLUSH  06/24/2023 [facility] EST PT 15 Marlene Simas, MD  06/24/2023 [facility] INFUSION 2HR30MIN (150) [facility] INFUSION  06/18/2023 [facility] POST OP Wilhelmenia Harada, MD  06/11/2023 [facility] IMG [address] VAS US  UE VENOUS [facility]ASC 10        ==========APPENDIX - ON TREATMENT VISIT NOTES==========   See weekly On Treatment Notes in Epic for details in the Media tab (listed as Progress notes on the On Treatment Visit Dates listed above).

## 2023-06-16 NOTE — Progress Notes (Signed)
  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 He developed fatigue and continued to experience pain when weight bearing during his treatment.   The patient will return in one month and will continue follow up with Dr. Marguerita Shih as well.      Amiel Kalata, PA-C

## 2023-06-17 ENCOUNTER — Other Ambulatory Visit: Payer: HMO

## 2023-06-18 ENCOUNTER — Encounter (HOSPITAL_BASED_OUTPATIENT_CLINIC_OR_DEPARTMENT_OTHER): Admitting: Orthopaedic Surgery

## 2023-06-18 ENCOUNTER — Encounter (HOSPITAL_BASED_OUTPATIENT_CLINIC_OR_DEPARTMENT_OTHER): Payer: Self-pay

## 2023-06-24 ENCOUNTER — Inpatient Hospital Stay: Payer: HMO

## 2023-06-24 ENCOUNTER — Inpatient Hospital Stay: Payer: HMO | Admitting: Internal Medicine

## 2023-06-24 ENCOUNTER — Inpatient Hospital Stay: Admitting: Nurse Practitioner

## 2023-06-24 ENCOUNTER — Other Ambulatory Visit

## 2023-06-24 ENCOUNTER — Telehealth: Payer: Self-pay | Admitting: Internal Medicine

## 2023-06-24 ENCOUNTER — Encounter

## 2023-07-01 NOTE — Progress Notes (Unsigned)
 [facility] OFFICE PROGRESS NOTE  Alfredia Ina, MD [address] [place] [address]  DIAGNOSIS: Stage IV (T2b, N3, M1b) non-small cell lung cancer, adenocarcinoma . This was diagnosed in December 2024 and presented with large right apical lung mass in addition to right hilar, mediastinal and supraclavicular lymphadenopathy as well as pulmonary nodules in the right upper lobe and right lower lobe with T10 bone metastasis in addition to medial left inguinal lymph node versus subcutaneous nodule.    Biomarker Findings HRD signature - HRDsig Negative Microsatellite status - Cannot Be Determined ? Tumor Mutational Burden - 8 Muts/Mb Genomic Findings For a complete list of the genes assayed, please refer to the Appendix. CDKN2A loss KEAP1 K97fs*60 STK11 E291* CDKN2B loss IKZF1 R111* TP53 R280K 8 Disease relevant genes with no reportable alterations: ALK, BRAF, EGFR, ERBB2, KRAS, MET, RET, ROS1  PRIOR THERAPY:  1) Palliative radiotherapy to the T10 bone metastasis  2) Radiation to right femur under the care of Dr. Eloise Hake completed on 06/10/23  CURRENT THERAPY: First-line treatment with chemoimmunotherapy in the form of carboplatin  for AUC of 5 and paclitaxel  175 Mg/M2 on days 1 and 22 in addition to ipilimumab  1 Mg/KG on day 1 and nivolumab  360 Mg IV on days 1 and 22 for 1 cycle followed by maintenance treatment with ipilimumab  1 Mg/KG every 6 weeks and nivolumab  360 Mg IV every 3 weeks. First dose February 18, 2023. Chemotherapy was discontinued starting from day 22 of cycle #1 secondary to intolerance.   INTERVAL HISTORY: Eual H Castiglia 78 y.o. male returns to the clinic today for a follow up visit. The patient was last seen by Dr. Marguerita Shih 06/03/23. The patient is on immunotherapy. He tolerated the last round of treatment well. He completed palliative radiation to the right femur under the care of Dr. Eloise Hake which was completed on 06/10/23. His pain is ***.    He sees palliative care and is expected to see them on ***  Returns to the clinic for a follow up visit. He also has anemia. He is on blood thinner. He reports easy bruising. The patient is feeling well today without any concerning complaints. The patient continues to tolerate treatment with __ well without any adverse effects. Denies any fever, chills, night sweats, or weight loss. Denies any chest pain, shortness of breath, cough, or hemoptysis. ***daily nausea. Denies any nausea, vomiting, diarrhea, or constipation. Denies any headache or visual changes. Denies any rashes or skin changes. The patient is here today for evaluation prior to starting cycle # 3    MEDICAL HISTORY: Past Medical History:  Diagnosis Date   Acid reflux    Arrhythmia    Arthritis    Cataract    bilateral cataract extraction with intraoccular lens implants   Chest pain, unspecified    CHF (congestive heart failure) (HCC)    Chronic airway obstruction, not elsewhere classified    PT. DENIES HE HAS COPD   CKD (chronic kidney disease)    Colon polyps    Adenomatous Polyps 2007   Depression    Diabetes mellitus    Diverticulosis    Gout    Heart murmur    History of kidney stones    History of radiation therapy    Thoracic Spine- 01/27/23-02/10/23- Dr. Retta Caster   Hyperlipemia    Hypertension    Lung cancer (HCC) 12/2022   Nephrolithiasis    Normocytic anemia 04/23/2017   Obstructive sleep apnea (adult) (pediatric)  Other and unspecified hyperlipidemia    Other left bundle branch block    Retinal vascular occlusion, unspecified    right   Shortness of breath    on exertion   Unspecified sleep apnea    Vision loss of right eye    White coat syndrome with diagnosis of hypertension 09/19/2017    ALLERGIES:  is allergic to amlodipine , penicillins, and hydralazine .  MEDICATIONS:  Current Outpatient Medications  Medication Sig Dispense Refill   aspirin  EC 325 MG tablet Take 1 tablet (325 mg  total) by mouth daily. 30 tablet 0   atorvastatin  (LIPITOR) 40 MG tablet Take 40 mg by mouth at bedtime.     buPROPion  (WELLBUTRIN  XL) 150 MG 24 hr tablet Take 150 mg by mouth every morning.     Dulaglutide (TRULICITY) 1.5 MG/0.5ML SOAJ Inject 1.5 mg into the skin daily. Takes on Thursdays     famotidine  (PEPCID ) 20 MG tablet Take 1 tablet (20 mg total) by mouth 2 (two) times daily. (Patient taking differently: Take 20 mg by mouth daily.) 60 tablet 3   gabapentin  (NEURONTIN ) 100 MG capsule Take 1 capsule (100 mg total) by mouth daily.     HYDROcodone -acetaminophen  (NORCO/VICODIN) 5-325 MG tablet Take 1 tablet by mouth every 6 (six) hours as needed for moderate pain (pain score 4-6). 45 tablet 0   insulin  glargine (LANTUS ) 100 UNIT/ML injection Inject 41 Units into the skin in the morning.     loratadine  (CLARITIN ) 10 MG tablet Take 10 mg by mouth daily.     LORazepam  (ATIVAN ) 0.5 MG tablet Take 1 tablet (0.5 mg total) by mouth every 8 (eight) hours as needed for anxiety. 0.5 mg p.o. every 8 hours as needed for anxiety/nausea. 30 tablet 0   ondansetron  (ZOFRAN ) 4 MG tablet Take 1 tablet (4 mg total) by mouth every 8 (eight) hours as needed for nausea or vomiting. 30 tablet 2   OXYGEN Inhale 1.5 L into the lungs daily as needed (Shortness of breath).     prochlorperazine  (COMPAZINE ) 10 MG tablet Take 1 tablet (10 mg total) by mouth every 6 (six) hours as needed for nausea or vomiting. (Patient not taking: Reported on 05/21/2023) 30 tablet 1   sertraline  (ZOLOFT ) 100 MG tablet Take 100 mg by mouth in the morning.     torsemide  (DEMADEX ) 20 MG tablet Take 2 tablets (40 mg total) by mouth daily. (Patient not taking: Reported on 03/27/2023) 60 tablet 0   No current facility-administered medications for this visit.    SURGICAL HISTORY:  Past Surgical History:  Procedure Laterality Date   ANTERIOR CERVICAL DECOMP/DISCECTOMY FUSION N/A 06/26/2016   Procedure: Re-Exploration of Cervical Wound;  Surgeon:  Gearl Keens, MD;  Location: [facility] OR;  Service: Neurosurgery;  Laterality: N/A;   ANTERIOR CERVICAL DECOMP/DISCECTOMY FUSION N/A 06/26/2016   Procedure: ANTERIOR CERVICAL DECOMPRESSION/DISCECTOMY FUSION, INTERBODY PROSTESIS, PLATE, CERVICAL THREE CERVICAL FOUR, CERVICAL FOUR CERVICAL FIVE CERVICAL SIX;  Surgeon: Garry Kansas, MD;  Location: [facility] OR;  Service: Neurosurgery;  Laterality: N/A;   APPLICATION OF WOUND VAC  06/05/2020   Procedure: APPLICATION OF WOUND VAC;  Surgeon: Garry Kansas, MD;  Location: [facility] OR;  Service: Neurosurgery;;   BACK SURGERY     CATARACT EXTRACTION Bilateral    FEMUR IM NAIL Right 05/06/2023   Procedure: RIGHT FEMUR NAILING;  Surgeon: Wilhelmenia Harada, MD;  Location: [facility] OR;  Service: Orthopedics;  Laterality: Right;  RIGHT INTERTROCHANTERIC INTRAMEDULLARY HIP NAIL   IR IMAGING GUIDED PORT INSERTION  02/19/2023  KIDNEY STONE SURGERY     KYPHOPLASTY N/A 01/19/2018   Procedure: KYPHOPLASTY LUMBAR THREE;  Surgeon: Garry Kansas, MD;  Location: [facility] OR;  Service: Neurosurgery;  Laterality: N/A;  KYPHOPLASTY LUMBAR THREE   LUMBAR WOUND DEBRIDEMENT N/A 06/05/2020   Procedure: LUMBAR WOUND DEBRIDEMENT;  Surgeon: Garry Kansas, MD;  Location[facility] OR;  Service: Neurosurgery;  Laterality: N/A;    REVIEW OF SYSTEMS:   Review of Systems  Constitutional: Negative for appetite change, chills, fatigue, fever and unexpected weight change.  HENT:   Negative for mouth sores, nosebleeds, sore throat and trouble swallowing.   Eyes: Negative for eye problems and icterus.  Respiratory: Negative for cough, hemoptysis, shortness of breath and wheezing.   Cardiovascular: Negative for chest pain and leg swelling.  Gastrointestinal: Negative for abdominal pain, constipation, diarrhea, nausea and vomiting.  Genitourinary: Negative for bladder incontinence, difficulty urinating, dysuria, frequency and hematuria.   Musculoskeletal: Negative for back pain, gait problem, neck pain and neck  stiffness.  Skin: Negative for itching and rash.  Neurological: Negative for dizziness, extremity weakness, gait problem, headaches, light-headedness and seizures.  Hematological: Negative for adenopathy. Does not bruise/bleed easily.  Psychiatric/Behavioral: Negative for confusion, depression and sleep disturbance. The patient is not nervous/anxious.     PHYSICAL EXAMINATION:  There were no vitals taken for this visit.  ECOG PERFORMANCE STATUS: {CHL ONC ECOG [id number]  Physical Exam  Constitutional: Oriented to person, place, and time and well-developed, well-nourished, and in no distress. No distress.  HENT:  Head: Normocephalic and atraumatic.  Mouth/Throat: Oropharynx is clear and moist. No oropharyngeal exudate.  Eyes: Conjunctivae are normal. Right eye exhibits no discharge. Left eye exhibits no discharge. No scleral icterus.  Neck: Normal range of motion. Neck supple.  Cardiovascular: Normal rate, regular rhythm, normal heart sounds and intact distal pulses.   Pulmonary/Chest: Effort normal and breath sounds normal. No respiratory distress. No wheezes. No rales.  Abdominal: Soft. Bowel sounds are normal. Exhibits no distension and no mass. There is no tenderness.  Musculoskeletal: Normal range of motion. Exhibits no edema.  Lymphadenopathy:    No cervical adenopathy.  Neurological: Alert and oriented to person, place, and time. Exhibits normal muscle tone. Gait normal. Coordination normal.  Skin: Skin is warm and dry. No rash noted. Not diaphoretic. No erythema. No pallor.  Psychiatric: Mood, memory and judgment normal.  Vitals reviewed.  LABORATORY DATA: Lab Results  Component Value Date   WBC 7.7 06/03/2023   HGB 9.6 (L) 06/03/2023   HCT 29.4 (L) 06/03/2023   MCV 95.1 06/03/2023   PLT 207 06/03/2023      Chemistry      Component Value Date/Time   NA 139 06/03/2023 1326   NA 142 07/03/2022 1356   K 4.7 06/03/2023 1326   CL 105 06/03/2023 1326   CO2 28  06/03/2023 1326   BUN 28 (H) 06/03/2023 1326   BUN 22 07/03/2022 1356   CREATININE 2.75 (H) 06/03/2023 1326      Component Value Date/Time   CALCIUM  8.6 (L) 06/03/2023 1326   ALKPHOS 122 06/03/2023 1326   AST 15 06/03/2023 1326   ALT 10 06/03/2023 1326   BILITOT 0.4 06/03/2023 1326       RADIOGRAPHIC STUDIES:  VAS US  UPPER EXTREMITY VENOUS DUPLEX Result Date: 06/11/2023 UPPER VENOUS STUDY  Patient Name:  LATHYN GRIGGS  Date of Exam:   06/11/2023 Medical Rec #: [id number]      Accession #:    [id number] Date of Birth: [birth year]  Patient Gender: M Patient Age:   78 years Exam Location:  [address] Procedure:      VAS US  UPPER EXTREMITY VENOUS DUPLEX Referring Phys: Retta Caster --------------------------------------------------------------------------------  Indications: Swelling Risk Factors: Cancer lung Trauma Recent fall. Comparison Study: None. Performing Technologist: Estanislao Heimlich  Examination Guidelines: A complete evaluation includes B-mode imaging, spectral Doppler, color Doppler, and power Doppler as needed of all accessible portions of each vessel. Bilateral testing is considered an integral part of a complete examination. Limited examinations for reoccurring indications may be performed as noted.  Right Findings: +----------+------------+---------+-----------+----------+-------+ RIGHT     CompressiblePhasicitySpontaneousPropertiesSummary +----------+------------+---------+-----------+----------+-------+ IJV           Full       Yes       Yes                      +----------+------------+---------+-----------+----------+-------+ Subclavian    Full       Yes       Yes                      +----------+------------+---------+-----------+----------+-------+ Axillary      Full       Yes       Yes                      +----------+------------+---------+-----------+----------+-------+ Brachial      Full       Yes       Yes                       +----------+------------+---------+-----------+----------+-------+ Radial        Full       Yes       Yes                      +----------+------------+---------+-----------+----------+-------+ Ulnar         Full       Yes       Yes                      +----------+------------+---------+-----------+----------+-------+ Cephalic      Full                                          +----------+------------+---------+-----------+----------+-------+ Basilic       Full                 Yes                      +----------+------------+---------+-----------+----------+-------+  Left Findings: +----------+------------+---------+-----------+----------+-------+ LEFT      CompressiblePhasicitySpontaneousPropertiesSummary +----------+------------+---------+-----------+----------+-------+ Subclavian    Full       Yes       Yes                      +----------+------------+---------+-----------+----------+-------+  Summary:  Right: No evidence of deep vein thrombosis in the upper extremity. No evidence of superficial vein thrombosis in the upper extremity.  Left: No evidence of thrombosis in the subclavian.  *See table(s) above for measurements and observations.  Diagnosing physician: Angela Kell MD Electronically signed by Angela Kell MD on 06/11/2023 at 1:22:30 PM.    Final      ASSESSMENT/PLAN:  This is a very pleasant 78 year old Caucasian male diagnosed with  stage IV (T2b, N3, M1 B) non-small cell lung cancer, adenocarcinoma. He was diagnosed in December 2024. He presented with a large right apical lung mass in addition to right hilar, mediastinal, and supraclavicular lymphadenopathy as well as pulmonary nodules, T10 metastatic bone lesion in addition to a subcutaneous nodule. He has no actionable mutation and there was insufficient material for PD-L1 expression.   He completed palliative radiation to the T10 vertebral body lesion.   He also completed palliative radiation to  the right femur.   For systemic treatment he is currently on first-line chemoimmunotherapy with carboplatin  for an AUC of 5 and paclitaxel  175 mg/m on days 1 and 22 in addition to ipilimumab  1 mg/kg on day 1 and nivolumab  360 mg on days 1 and 22.  This will be followed by maintenance treatment with ipilimumab  every 6 weeks and nivolumab  every 3 weeks.  His first dose was on 02/18/2023.  He is status post 2 cycles of chemotherapy. He had a challenging time with chemotherapy and this portion of his treatment was discontinued starting from day 22 cycle #1.    Labs were reviewed. Recommend that he *** with cycle #1 day 4 today as scheduled.   We will see him back for labs and follow up in 3 weeks before undergoing cycle #5  He is scheduled to see palliative care today for his cancer related pain.   The patient will continue to use Zofran  for antiemetics.    The patient was advised to call immediately if he has any concerning symptoms in the interval. The patient voices understanding of current disease status and treatment options and is in agreement with the current care plan. All questions were answered. The patient knows to call the clinic with any problems, questions or concerns. We can certainly see the patient much sooner if necessary   No orders of the defined types were placed in this encounter.    I spent {CHL ONC TIME VISIT - WUXLK:[id number]} counseling the patient face to face. The total time spent in the appointment was {CHL ONC TIME VISIT - GUYQI:[id number]}.  Telitha Plath L Ekta Dancer, PA-C 07/01/23

## 2023-07-04 ENCOUNTER — Inpatient Hospital Stay

## 2023-07-04 ENCOUNTER — Other Ambulatory Visit: Payer: Self-pay | Admitting: Physician Assistant

## 2023-07-04 ENCOUNTER — Inpatient Hospital Stay: Attending: Nurse Practitioner | Admitting: Physician Assistant

## 2023-07-04 ENCOUNTER — Ambulatory Visit (HOSPITAL_COMMUNITY)
Admission: RE | Admit: 2023-07-04 | Discharge: 2023-07-04 | Disposition: A | Discharge status: Home / Self Care | Source: Ambulatory Visit | Attending: Vascular Surgery | Admitting: Vascular Surgery

## 2023-07-04 ENCOUNTER — Telehealth: Payer: Self-pay | Admitting: Physician Assistant

## 2023-07-04 ENCOUNTER — Encounter: Payer: Self-pay | Admitting: Internal Medicine

## 2023-07-04 VITALS — BP 143/88 | HR 87 | Temp 97.7°F | Resp 16 | Wt 156.0 lb

## 2023-07-04 DIAGNOSIS — C3411 Malignant neoplasm of upper lobe, right bronchus or lung: Secondary | ICD-10-CM

## 2023-07-04 DIAGNOSIS — Z923 Personal history of irradiation: Secondary | ICD-10-CM | POA: Insufficient documentation

## 2023-07-04 DIAGNOSIS — I509 Heart failure, unspecified: Secondary | ICD-10-CM | POA: Insufficient documentation

## 2023-07-04 DIAGNOSIS — Z794 Long term (current) use of insulin: Secondary | ICD-10-CM | POA: Insufficient documentation

## 2023-07-04 DIAGNOSIS — G4733 Obstructive sleep apnea (adult) (pediatric): Secondary | ICD-10-CM | POA: Diagnosis not present

## 2023-07-04 DIAGNOSIS — K219 Gastro-esophageal reflux disease without esophagitis: Secondary | ICD-10-CM | POA: Diagnosis not present

## 2023-07-04 DIAGNOSIS — Z79899 Other long term (current) drug therapy: Secondary | ICD-10-CM | POA: Insufficient documentation

## 2023-07-04 DIAGNOSIS — Z7985 Long-term (current) use of injectable non-insulin antidiabetic drugs: Secondary | ICD-10-CM | POA: Diagnosis not present

## 2023-07-04 DIAGNOSIS — M7989 Other specified soft tissue disorders: Secondary | ICD-10-CM

## 2023-07-04 DIAGNOSIS — R040 Epistaxis: Secondary | ICD-10-CM | POA: Insufficient documentation

## 2023-07-04 DIAGNOSIS — Z85118 Personal history of other malignant neoplasm of bronchus and lung: Secondary | ICD-10-CM | POA: Insufficient documentation

## 2023-07-04 DIAGNOSIS — Z5112 Encounter for antineoplastic immunotherapy: Secondary | ICD-10-CM | POA: Diagnosis present

## 2023-07-04 DIAGNOSIS — I824Y2 Acute embolism and thrombosis of unspecified deep veins of left proximal lower extremity: Secondary | ICD-10-CM

## 2023-07-04 DIAGNOSIS — C7951 Secondary malignant neoplasm of bone: Secondary | ICD-10-CM | POA: Diagnosis not present

## 2023-07-04 DIAGNOSIS — E1122 Type 2 diabetes mellitus with diabetic chronic kidney disease: Secondary | ICD-10-CM | POA: Insufficient documentation

## 2023-07-04 DIAGNOSIS — E785 Hyperlipidemia, unspecified: Secondary | ICD-10-CM | POA: Insufficient documentation

## 2023-07-04 DIAGNOSIS — M25512 Pain in left shoulder: Secondary | ICD-10-CM | POA: Diagnosis not present

## 2023-07-04 DIAGNOSIS — I130 Hypertensive heart and chronic kidney disease with heart failure and stage 1 through stage 4 chronic kidney disease, or unspecified chronic kidney disease: Secondary | ICD-10-CM | POA: Diagnosis not present

## 2023-07-04 DIAGNOSIS — Z95828 Presence of other vascular implants and grafts: Secondary | ICD-10-CM

## 2023-07-04 DIAGNOSIS — N189 Chronic kidney disease, unspecified: Secondary | ICD-10-CM | POA: Diagnosis not present

## 2023-07-04 DIAGNOSIS — R634 Abnormal weight loss: Secondary | ICD-10-CM

## 2023-07-04 LAB — CMP (CANCER CENTER ONLY)
ALT: 10 U/L (ref 0–44)
AST: 13 U/L — ABNORMAL LOW (ref 15–41)
Albumin: 3 g/dL — ABNORMAL LOW (ref 3.5–5.0)
Alkaline Phosphatase: 105 U/L (ref 38–126)
Anion gap: 6 (ref 5–15)
BUN: 29 mg/dL — ABNORMAL HIGH (ref 8–23)
CO2: 27 mmol/L (ref 22–32)
Calcium: 8.7 mg/dL — ABNORMAL LOW (ref 8.9–10.3)
Chloride: 105 mmol/L (ref 98–111)
Creatinine: 2.47 mg/dL — ABNORMAL HIGH (ref 0.61–1.24)
GFR, Estimated: 26 mL/min — ABNORMAL LOW (ref >60)
Glucose, Bld: 143 mg/dL — ABNORMAL HIGH (ref 70–99)
Potassium: 4 mmol/L (ref 3.5–5.1)
Sodium: 138 mmol/L (ref 135–145)
Total Bilirubin: 0.5 mg/dL (ref 0.0–1.2)
Total Protein: 6.9 g/dL (ref 6.5–8.1)

## 2023-07-04 LAB — CBC WITH DIFFERENTIAL (CANCER CENTER ONLY)
Abs Immature Granulocytes: 0.02 10*3/uL (ref 0.00–0.07)
Basophils Absolute: 0 10*3/uL (ref 0.0–0.1)
Basophils Relative: 0 %
Eosinophils Absolute: 0.1 10*3/uL (ref 0.0–0.5)
Eosinophils Relative: 1 %
HCT: 31.2 % — ABNORMAL LOW (ref 39.0–52.0)
Hemoglobin: 10.2 g/dL — ABNORMAL LOW (ref 13.0–17.0)
Immature Granulocytes: 0 %
Lymphocytes Relative: 10 %
Lymphs Abs: 0.7 10*3/uL (ref 0.7–4.0)
MCH: 30.3 pg (ref 26.0–34.0)
MCHC: 32.7 g/dL (ref 30.0–36.0)
MCV: 92.6 fL (ref 80.0–100.0)
Monocytes Absolute: 0.5 10*3/uL (ref 0.1–1.0)
Monocytes Relative: 7 %
Neutro Abs: 5.6 10*3/uL (ref 1.7–7.7)
Neutrophils Relative %: 82 %
Platelet Count: 179 10*3/uL (ref 150–400)
RBC: 3.37 MIL/uL — ABNORMAL LOW (ref 4.22–5.81)
RDW: 15.6 % — ABNORMAL HIGH (ref 11.5–15.5)
WBC Count: 6.8 10*3/uL (ref 4.0–10.5)
nRBC: 0 % (ref 0.0–0.2)

## 2023-07-04 LAB — TSH: TSH: 2.76 u[IU]/mL (ref 0.350–4.500)

## 2023-07-04 MED ORDER — SODIUM CHLORIDE 0.9% FLUSH
10.0000 mL | INTRAVENOUS | Status: DC | PRN
  Administered 2023-07-04: 10 mL

## 2023-07-04 MED ORDER — SODIUM CHLORIDE 0.9 % IV SOLN
360.0000 mg | Freq: Once | INTRAVENOUS | Status: AC
  Administered 2023-07-04: 360 mg via INTRAVENOUS
  Filled 2023-07-04: qty 12

## 2023-07-04 MED ORDER — FAMOTIDINE IN NACL 20-0.9 MG/50ML-% IV SOLN
20.0000 mg | Freq: Once | INTRAVENOUS | Status: AC
  Administered 2023-07-04: 20 mg via INTRAVENOUS
  Filled 2023-07-04: qty 50

## 2023-07-04 MED ORDER — SODIUM CHLORIDE 0.9% FLUSH
10.0000 mL | Freq: Once | INTRAVENOUS | Status: AC
  Administered 2023-07-04: 10 mL

## 2023-07-04 MED ORDER — SODIUM CHLORIDE 0.9 % IV SOLN
70.0000 mg | Freq: Once | INTRAVENOUS | Status: AC
  Administered 2023-07-04: 70 mg via INTRAVENOUS
  Filled 2023-07-04: qty 14

## 2023-07-04 MED ORDER — HEPARIN SOD (PORK) LOCK FLUSH 100 UNIT/ML IV SOLN
500.0000 [IU] | Freq: Once | INTRAVENOUS | Status: AC | PRN
  Administered 2023-07-04: 500 [IU]

## 2023-07-04 MED ORDER — SODIUM CHLORIDE 0.9 % IV SOLN: INTRAVENOUS | Status: DC

## 2023-07-04 MED ORDER — DIPHENHYDRAMINE HCL 50 MG/ML IJ SOLN
25.0000 mg | Freq: Once | INTRAMUSCULAR | Status: AC
  Administered 2023-07-04: 25 mg via INTRAVENOUS
  Filled 2023-07-04: qty 1

## 2023-07-04 MED ORDER — APIXABAN (ELIQUIS) VTE STARTER PACK (10MG AND 5MG): ORAL_TABLET | ORAL | 0 refills | Status: DC

## 2023-07-04 NOTE — Telephone Encounter (Signed)
 The patient had lower extremity ultrasound today to assess for swelling. He was found to have DVT. I called his daughter to discuss this. I have sent eliquis to the pharmacy. Discussed this will be long term medication for the time being. Should he develop any concerning bleeding, they were advised to call. The patient does bruise easily at baseline on his upper extremities. Should he develop signs and symptoms of PE such as shortness of breath, chest pain, tachycardia, hypoxia, hypotension, etc, that would be an emergency situation that would warrant ER evaluation. She expressed understanding. Prescription sent to pharmacy.

## 2023-07-04 NOTE — Progress Notes (Signed)
 Ipilimumab  (YERVOY ) Patient Monitoring Assessment   Is the patient experiencing any of the following general symptoms?:  [] Difficulty performing normal activities [x] Feeling sluggish or cold all the time [] Unusual weight gain [] Constant or unusual headaches [x] Feeling dizzy or faint [] Changes in eyesight (blurry vision, double vision, or other vision problems) [] Changes in mood or behavior (ex: decreased sex drive, irritability, or forgetfulness) [] Starting new medications (ex: steroids, other medications that lower immune response) [] Patient is not experiencing any of the general symptoms above.    Gastrointestinal  Patient is having 1 bowel movements each day.  Is this different from baseline? [] Yes [x] No Are your stools watery or do they have a foul smell? [] Yes [x] No Have you seen blood in your stools? [] Yes [x] No Are your stools dark, tarry, or sticky? [] Yes [x] No Are you having pain or tenderness in your belly? [] Yes [x] No  Skin Does your skin itch? [] Yes [x] No Do you have a rash? [] Yes [x] No Has your skin blistered and/or peeled? [] Yes [x] No Do you have sores in your mouth? [] Yes [x] No  Hepatic Has your urine been dark or tea colored? [] Yes [x] No Have you noticed that your skin or the whites of your eyes are turning yellow? [] Yes [x] No Are you bleeding or bruising more easily than normal? [] Yes [x] No Are you nauseous and/or vomiting? [x] Yes [] No Do you have pain on the right side of your stomach? [] Yes [x] No  Neurologic  Are you having unusual weakness of legs, arms, or face? [x] Yes [] No Are you having numbness or tingling in your hands or feet? [x] Yes [] No  Jeffery Fernandez B Jeffery Fernandez

## 2023-07-04 NOTE — Progress Notes (Signed)
 Ok to adjust Yervoy  to 70mg  (1mg /kg) per [place], PA.  Hudson Lehmkuhl, PharmD, MBA

## 2023-07-04 NOTE — Patient Instructions (Addendum)
 Last week, norovircus. Last BP 2 days ago normal color and consisdently.  -diarrhea for 1 day 5-6x and nausea and vomiting. Monday last week. 5/26. No fevers. During that time chilled. Discussed protein drinks. Encouraged glucerna. Appetite just not hugnry. Breathing a little off. Oxygen machine as needed O2 been above 95%. Uses oxygen. 2 months and din't. No cough. Eacy bruising. Get nausea daily. Nausea meds zofran  works. No rashes.       Radiation hallucination. More often. Forgetful.

## 2023-07-05 LAB — T4: T4, Total: 6 ug/dL (ref 4.5–12.0)

## 2023-07-07 ENCOUNTER — Ambulatory Visit
Admission: RE | Admit: 2023-07-07 | Discharge: 2023-07-07 | Disposition: A | Discharge status: Home / Self Care | Source: Ambulatory Visit | Attending: Radiation Oncology | Admitting: Radiation Oncology

## 2023-07-07 ENCOUNTER — Encounter: Payer: Self-pay | Admitting: Internal Medicine

## 2023-07-07 ENCOUNTER — Encounter: Payer: Self-pay | Admitting: Radiation Oncology

## 2023-07-07 VITALS — BP 135/95 | HR 95 | Temp 97.0°F | Resp 19 | Wt 169.6 lb

## 2023-07-07 DIAGNOSIS — C7951 Secondary malignant neoplasm of bone: Secondary | ICD-10-CM | POA: Diagnosis not present

## 2023-07-07 DIAGNOSIS — M25512 Pain in left shoulder: Secondary | ICD-10-CM | POA: Diagnosis not present

## 2023-07-07 DIAGNOSIS — Z7985 Long-term (current) use of injectable non-insulin antidiabetic drugs: Secondary | ICD-10-CM | POA: Diagnosis not present

## 2023-07-07 DIAGNOSIS — Z794 Long term (current) use of insulin: Secondary | ICD-10-CM | POA: Insufficient documentation

## 2023-07-07 DIAGNOSIS — C3490 Malignant neoplasm of unspecified part of unspecified bronchus or lung: Secondary | ICD-10-CM

## 2023-07-07 DIAGNOSIS — Z923 Personal history of irradiation: Secondary | ICD-10-CM | POA: Insufficient documentation

## 2023-07-07 DIAGNOSIS — Z7901 Long term (current) use of anticoagulants: Secondary | ICD-10-CM | POA: Diagnosis not present

## 2023-07-07 DIAGNOSIS — Z79899 Other long term (current) drug therapy: Secondary | ICD-10-CM | POA: Insufficient documentation

## 2023-07-07 DIAGNOSIS — C3411 Malignant neoplasm of upper lobe, right bronchus or lung: Secondary | ICD-10-CM | POA: Insufficient documentation

## 2023-07-07 NOTE — Progress Notes (Signed)
 Bilbo H Hild is here today for follow up post radiation to the right femur  They completed their radiation on: 06/10/23   Does the patient complain of any of the following:  Pain:Yes, reports low back pain, rates 5/10. Swelling: None to right leg.  Ambulation: reports balance issues.  Post radiation skin changes: No    Additional comments if applicable:  Recent diagnosis of blood clot to left leg. Patient started on eliqus.   BP (!) 135/95   Pulse 95   Temp (!) 97 F (36.1 C)   Resp 19   Wt 169 lb 9.6 oz (76.9 kg)   SpO2 94%   BMI 24.34 kg/m

## 2023-07-07 NOTE — Progress Notes (Signed)
 Radiation Oncology         [phone] ________________________________  Name: Jeffery Fernandez MRN: [id number]  Date: 07/07/2023  DOB: 01/17/1946  Follow-Up Visit Note  CC: Alfredia Ina, MD  Alfredia Ina, MD    ICD-10-CM   1. 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     Indication for treatment: Palliative     Radiation treatment dates:  First Treatment Date: 2023-01-27 --  Last Treatment Date: 2023-02-10 Site/Dose/Technique/Mode:  Plan Name: Spine_T Site: Thoracic Spine Technique: 3D Mode: Photon Dose Per Fraction: 3 Gy Prescribed Dose (Delivered / Prescribed): 30 Gy / 30 Gy Prescribed Fxs (Delivered / Prescribed): 10 / 10  Narrative:  The patient returns today for routine follow-up. He tolerated radiation therapy relatively well overall. He did develop fatigue and continued to experience pain with weight bearing to his right thigh during his treatment course.       Since his consultation date (an while undergoing radiation therapy) he has continued to received immunotherapy with ipilimumab  every 6 weeks in addition to nivolumab  every 3 weeks under the care of Dr. Marguerita Shih.           He was tolerating immunotherapy relatively well. However,  he was observed to have lost a significant amount of weight during his most recent visit with Dr. Marguerita Shih on 07/04/23. He also had some left LE swelling and left shoulder pain. Dr. Marguerita Shih has accordingly placed referrals to nutrition and has added an order for a CT of the left shoulder to be performed at the time of his next restaging CT CAP later this month. For his left LE swelling, a LE vascular US  was performed that day which showed age indeterminate DVT in one of the paired posterior tibial veins. He was accordingly started on eliquis .   Patient reports new 9/10 left shoulder pain and some associated left shoulder tingling. He denies any numbness or tingling down his right arm. His wife notes that he has trouble holding things with both hands. He is taking hydrocodone  PRN for this pain. He states that his right femur pain has improved since completing the radiation treatment. He notes chronic low back pain, consistent with his baseline. He is able to ambulate on his own.   Allergies:  is allergic to amlodipine , penicillins, and hydralazine .  Meds: Current Outpatient Medications  Medication Sig Dispense Refill   APIXABAN  (ELIQUIS ) VTE STARTER PACK (10MG  AND 5MG ) Take as directed on package: start with two-5mg  tablets twice daily for 7 days. On day 8, switch to one-5mg  tablet twice daily. 74 each 0   atorvastatin  (LIPITOR) 40 MG tablet Take 40 mg by mouth at bedtime.     buPROPion  (WELLBUTRIN  XL) 150 MG 24 hr tablet Take 150 mg by mouth every morning.  Dulaglutide (TRULICITY) 1.5 MG/0.5ML SOAJ Inject 1.5 mg into the skin daily. Takes on Thursdays     famotidine  (PEPCID ) 20 MG tablet Take 1 tablet (20 mg total) by mouth 2 (two) times daily. (Patient taking differently: Take 20 mg by mouth daily.) 60 tablet 3   gabapentin  (NEURONTIN ) 100 MG capsule Take 1 capsule (100 mg total) by mouth daily.     HYDROcodone -acetaminophen  (NORCO/VICODIN) 5-325 MG tablet Take 1 tablet by mouth every 6 (six) hours  as needed for moderate pain (pain score 4-6). 45 tablet 0   insulin  glargine (LANTUS ) 100 UNIT/ML injection Inject 41 Units into the skin in the morning.     loratadine  (CLARITIN ) 10 MG tablet Take 10 mg by mouth daily.     LORazepam  (ATIVAN ) 0.5 MG tablet Take 1 tablet (0.5 mg total) by mouth every 8 (eight) hours as needed for anxiety. 0.5 mg p.o. every 8 hours as needed for anxiety/nausea. 30 tablet 0   ondansetron  (ZOFRAN ) 4 MG tablet Take 1 tablet (4 mg total) by mouth every 8 (eight) hours as needed for nausea or vomiting. 30 tablet 2   OXYGEN Inhale 1.5 L into the lungs daily as needed (Shortness of breath).     prochlorperazine  (COMPAZINE ) 10 MG tablet Take 1 tablet (10 mg total) by mouth every 6 (six) hours as needed for nausea or vomiting. (Patient not taking: Reported on 05/21/2023) 30 tablet 1   sertraline  (ZOLOFT ) 100 MG tablet Take 100 mg by mouth in the morning.     No current facility-administered medications for this encounter.    Physical Findings: The patient is in no acute distress. Patient is alert and oriented.  weight is 169 lb 9.6 oz (76.9 kg). His temperature is 97 F (36.1 C) (abnormal). His blood pressure is 135/95 (abnormal) and his pulse is 95. His respiration is 19 and oxygen saturation is 94%. .  No significant changes. Lungs are clear to auscultation bilaterally. Heart has regular rate and rhythm. No palpable cervical, supraclavicular, or axillary adenopathy. Abdomen soft, non-tender, normal bowel sounds. 5/5 upper arm strength bilaterally. No tenderness to palpation along the left shoulder.    Lab Findings: Lab Results  Component Value Date   WBC 6.8 07/04/2023   HGB 10.2 (L) 07/04/2023   HCT 31.2 (L) 07/04/2023   MCV 92.6 07/04/2023   PLT 179 07/04/2023    Radiographic Findings: VAS US  LOWER EXTREMITY VENOUS (DVT) Result Date: 07/04/2023  Lower Venous DVT Study Patient Name:  Jeffery Fernandez  Date of Exam:   07/04/2023 Medical Rec #: [id number]      Accession  #:    [id number] Date of Birth: 1945-06-07       Patient Gender: M Patient Age:   78 years Exam Location:  [address] Procedure:      VAS US  LOWER EXTREMITY VENOUS (DVT) Referring Phys: Dalphine Duet --------------------------------------------------------------------------------  Indications: Swelling.  Risk Factors: Cancer Metastasis to the femur from stage IV adenocarcinoma, NSCLC, of the RUL. Performing Technologist: Helon Lobos RVT  Examination Guidelines: A complete evaluation includes B-mode imaging, spectral Doppler, color Doppler, and power Doppler as needed of all accessible portions of each vessel. Bilateral testing is considered an integral part of a complete examination. Limited examinations for reoccurring indications may be performed as noted. The reflux portion of the exam is performed with the patient in reverse Trendelenburg.  +---------+---------------+---------+-----------+----------+--------------+ LEFT     CompressibilityPhasicitySpontaneityPropertiesThrombus Aging +---------+---------------+---------+-----------+----------+--------------+ CFV      Full  Yes      Yes                                 +---------+---------------+---------+-----------+----------+--------------+ SFJ      Full                    Yes                                 +---------+---------------+---------+-----------+----------+--------------+ FV Prox  Full           Yes      Yes                                 +---------+---------------+---------+-----------+----------+--------------+ FV Mid   Full           Yes      Yes                                 +---------+---------------+---------+-----------+----------+--------------+ FV DistalFull           Yes      Yes                                 +---------+---------------+---------+-----------+----------+--------------+ POP      Full           Yes      Yes                                  +---------+---------------+---------+-----------+----------+--------------+ PTV      None                    No                                  +---------+---------------+---------+-----------+----------+--------------+ PERO                             Yes                                 +---------+---------------+---------+-----------+----------+--------------+ GSV      Full           Yes      Yes                                 +---------+---------------+---------+-----------+----------+--------------+  Findings reported to [facility] at 3:36pm.  Summary: RIGHT: - Age indeterminate DVT in one of the paired posterior tibial veins. All other veins appear patent.   *See table(s) above for measurements and observations. Electronically signed by Delaney Fearing on 07/04/2023 at 4:05:30 PM.    Final    VAS US  UPPER EXTREMITY VENOUS DUPLEX Result Date: 06/11/2023 UPPER VENOUS STUDY  Patient Name:  Jeffery Fernandez  Date of Exam:   06/11/2023 Medical Rec #: [id number]      Accession #:    [id number] Date of Birth: 01-04-1946  Patient Gender: M Patient Age:   10 years Exam Location:  [address] Procedure:      VAS US  UPPER EXTREMITY VENOUS DUPLEX Referring Phys: Retta Caster --------------------------------------------------------------------------------  Indications: Swelling Risk Factors: Cancer lung Trauma Recent fall. Comparison Study: None. Performing Technologist: Estanislao Heimlich  Examination Guidelines: A complete evaluation includes B-mode imaging, spectral Doppler, color Doppler, and power Doppler as needed of all accessible portions of each vessel. Bilateral testing is considered an integral part of a complete examination. Limited examinations for reoccurring indications may be performed as noted.  Right Findings: +----------+------------+---------+-----------+----------+-------+ RIGHT     CompressiblePhasicitySpontaneousPropertiesSummary  +----------+------------+---------+-----------+----------+-------+ IJV           Full       Yes       Yes                      +----------+------------+---------+-----------+----------+-------+ Subclavian    Full       Yes       Yes                      +----------+------------+---------+-----------+----------+-------+ Axillary      Full       Yes       Yes                      +----------+------------+---------+-----------+----------+-------+ Brachial      Full       Yes       Yes                      +----------+------------+---------+-----------+----------+-------+ Radial        Full       Yes       Yes                      +----------+------------+---------+-----------+----------+-------+ Ulnar         Full       Yes       Yes                      +----------+------------+---------+-----------+----------+-------+ Cephalic      Full                                          +----------+------------+---------+-----------+----------+-------+ Basilic       Full                 Yes                      +----------+------------+---------+-----------+----------+-------+  Left Findings: +----------+------------+---------+-----------+----------+-------+ LEFT      CompressiblePhasicitySpontaneousPropertiesSummary +----------+------------+---------+-----------+----------+-------+ Subclavian    Full       Yes       Yes                      +----------+------------+---------+-----------+----------+-------+  Summary:  Right: No evidence of deep vein thrombosis in the upper extremity. No evidence of superficial vein thrombosis in the upper extremity.  Left: No evidence of thrombosis in the subclavian.  *See table(s) above for measurements and observations.  Diagnosing physician: Angela Kell MD Electronically signed by Angela Kell MD on 06/11/2023 at 1:22:30 PM.    Final     Impression: Stage IV, NSCLC, adenocarcinoma of the RUL with osseous metastatic  disease  to T10 (s/p radiation therapy completed in January 2025) and to the right femur (s/p radiation therapy completed in May 2025)  The patient has healed well from the effects of radiation. He fortunately experienced a reduction in pain since completing his treatment. He is experiencing left shoulder pain and is getting a CT later this month for further evaluation. We could consider a palliative course of radiation to the area if imaging demonstrates disease metastasis.   He will continue on chemotherapy under the care of Dr. Marguerita Shih. Radiation follow-up PRN. We appreciate the opportunity to take part in this patient's care.    20 minutes of total time was spent for this patient encounter, including preparation, face-to-face counseling with the patient and coordination of care, physical exam, and documentation of the encounter. ____________________________________   Julio Ohm, PA-C   This document serves as a record of services personally performed by Julio Ohm, PA-C. It was created on her behalf by Aleta Anda, a trained medical scribe. The creation of this record is based on the scribe's personal observations and the provider's statements to them. This document has been checked and approved by the attending provider.

## 2023-07-13 ENCOUNTER — Other Ambulatory Visit: Payer: Self-pay

## 2023-07-13 ENCOUNTER — Encounter (HOSPITAL_COMMUNITY): Payer: Self-pay

## 2023-07-13 ENCOUNTER — Emergency Department (HOSPITAL_COMMUNITY)
Admission: EM | Admit: 2023-07-13 | Discharge: 2023-07-13 | Disposition: A | Discharge status: Home / Self Care | Attending: Emergency Medicine | Admitting: Emergency Medicine

## 2023-07-13 DIAGNOSIS — Z85118 Personal history of other malignant neoplasm of bronchus and lung: Secondary | ICD-10-CM | POA: Diagnosis not present

## 2023-07-13 DIAGNOSIS — Z794 Long term (current) use of insulin: Secondary | ICD-10-CM | POA: Diagnosis not present

## 2023-07-13 DIAGNOSIS — J449 Chronic obstructive pulmonary disease, unspecified: Secondary | ICD-10-CM | POA: Diagnosis not present

## 2023-07-13 DIAGNOSIS — R040 Epistaxis: Secondary | ICD-10-CM | POA: Insufficient documentation

## 2023-07-13 DIAGNOSIS — Z7901 Long term (current) use of anticoagulants: Secondary | ICD-10-CM | POA: Diagnosis not present

## 2023-07-13 DIAGNOSIS — E119 Type 2 diabetes mellitus without complications: Secondary | ICD-10-CM | POA: Insufficient documentation

## 2023-07-13 MED ORDER — CEPHALEXIN 500 MG PO CAPS: 500.0000 mg | ORAL_CAPSULE | Freq: Four times a day (QID) | ORAL | 0 refills | Status: DC

## 2023-07-13 MED ORDER — TRANEXAMIC ACID FOR INHALATION
500.0000 mg | Freq: Once | RESPIRATORY_TRACT | Status: AC
  Administered 2023-07-13: 500 mg via RESPIRATORY_TRACT
  Filled 2023-07-13: qty 10

## 2023-07-13 MED ORDER — OXYMETAZOLINE HCL 0.05 % NA SOLN
1.0000 | Freq: Once | NASAL | Status: AC
  Administered 2023-07-13: 1 via NASAL
  Filled 2023-07-13: qty 30

## 2023-07-13 MED ORDER — TRANEXAMIC ACID FOR EPISTAXIS
500.0000 mg | Freq: Once | TOPICAL | Status: AC
  Administered 2023-07-13: 500 mg via TOPICAL
  Filled 2023-07-13 (×2): qty 10

## 2023-07-13 NOTE — ED Provider Notes (Addendum)
 [place] EMERGENCY DEPARTMENT AT [facility] Provider Note   CSN: [id number] Arrival date & time: 07/13/23  1259     Patient presents with: Epistaxis   Jeffery Fernandez is a 78 y.o. male.   HPI    78 year old male comes in with chief complaint of nosebleed.  Patient has history of diabetes, COPD, lung cancer and recent diagnosis of DVT for which patient is on Eliquis .  He is also on as needed oxygen.  He is accompanied by daughter, who provides substantial part of the history.  According to daughter, patient started bleeding last night.  She has been holding pressure for him.  The bleeding was slowed down or stopped, but then restarted.  He has had constant trickle.  When they blow the nose, there are some clots passing.  No trauma.  Patient denies any recent illnesses or URI-like symptoms.  Last Eliquis  dose was last night.  Prior to Admission medications   Medication Sig Start Date End Date Taking? Authorizing Provider  APIXABAN  (ELIQUIS ) VTE STARTER PACK (10MG  AND 5MG ) Take as directed on package: start with two-5mg  tablets twice daily for 7 days. On day 8, switch to one-5mg  tablet twice daily. 07/04/23   Heilingoetter, Cassandra L, PA-C  atorvastatin  (LIPITOR) 40 MG tablet Take 40 mg by mouth at bedtime.    [provider]  buPROPion  (WELLBUTRIN  XL) 150 MG 24 hr tablet Take 150 mg by mouth every morning.    [provider]  Dulaglutide  (TRULICITY ) 1.5 MG/0.5ML SOAJ Inject 1.5 mg into the skin daily. Takes on Thursdays 10/01/22   [provider]  famotidine  (PEPCID ) 20 MG tablet Take 1 tablet (20 mg total) by mouth 2 (two) times daily. Patient taking differently: Take 20 mg by mouth daily. 03/04/23   Pickenpack-Cousar, Athena N, NP  gabapentin  (NEURONTIN ) 100 MG capsule Take 1 capsule (100 mg total) by mouth daily. 01/02/23   Marni Sins, MD  HYDROcodone -acetaminophen  (NORCO/VICODIN) 5-325 MG tablet Take 1 tablet by mouth every 6 (six) hours as  needed for moderate pain (pain score 4-6). 06/03/23   Pickenpack-Cousar, Giles Labrum, NP  insulin  glargine (LANTUS ) 100 UNIT/ML injection Inject 41 Units into the skin in the morning.    [provider]  loratadine  (CLARITIN ) 10 MG tablet Take 10 mg by mouth daily.    [provider]  LORazepam  (ATIVAN ) 0.5 MG tablet Take 1 tablet (0.5 mg total) by mouth every 8 (eight) hours as needed for anxiety. 0.5 mg p.o. every 8 hours as needed for anxiety/nausea. 04/22/23   Pickenpack-Cousar, Athena N, NP  ondansetron  (ZOFRAN ) 4 MG tablet Take 1 tablet (4 mg total) by mouth every 8 (eight) hours as needed for nausea or vomiting. 05/13/23   Pickenpack-Cousar, Giles Labrum, NP  OXYGEN Inhale 1.5 L into the lungs daily as needed (Shortness of breath).    [provider]  prochlorperazine  (COMPAZINE ) 10 MG tablet Take 1 tablet (10 mg total) by mouth every 6 (six) hours as needed for nausea or vomiting. Patient not taking: Reported on 05/21/2023 02/26/23   Pickenpack-Cousar, Athena N, NP  sertraline  (ZOLOFT ) 100 MG tablet Take 100 mg by mouth in the morning.    [provider]    Allergies: Amlodipine , Penicillins, and Hydralazine     Review of Systems  All other systems reviewed and are negative.   Updated Vital Signs BP (!) 160/93   Pulse 99   Temp 98 F (36.7 C) (Oral)   Resp 18   SpO2 95%  Physical Exam Vitals and nursing note reviewed.  Constitutional:      Appearance: He is well-developed.  HENT:     Head: Atraumatic.     Nose:     Comments: Bleeding from left nare  Cardiovascular:     Rate and Rhythm: Normal rate.  Pulmonary:     Effort: Pulmonary effort is normal.   Musculoskeletal:     Cervical back: Neck supple.   Skin:    General: Skin is warm.   Neurological:     Mental Status: He is alert and oriented to person, place, and time.     (all labs ordered are listed, but only abnormal results are displayed) Labs Reviewed - No data to  display  EKG: None  Radiology: No results found.   Epistaxis Management  Date/Time: 07/13/2023 2:11 PM  Performed by: Deatra Face, MD Authorized by: Deatra Face, MD   Consent:    Consent obtained:  Verbal   Consent given by:  Patient   Risks, benefits, and alternatives were discussed: yes     Risks discussed:  Bleeding Universal protocol:    Procedure explained and questions answered to patient or proxy's satisfaction: yes     Immediately prior to procedure, a time out was called: yes     Patient identity confirmed:  Arm band Anesthesia:    Anesthesia method:  None Procedure details:    Treatment site:  R anterior   Treatment complexity:  Limited   Treatment episode: recurring   Epistaxis Management  Date/Time: 07/13/2023 5:38 PM  Performed by: Deatra Face, MD Authorized by: Deatra Face, MD   Consent:    Consent obtained:  Verbal   Consent given by:  Patient   Risks, benefits, and alternatives were discussed: yes     Risks discussed:  Bleeding, nasal injury and infection    Medications Ordered in the ED  oxymetazoline (AFRIN) 0.05 % nasal spray 1 spray (1 spray Each Nare Given 07/13/23 1345)  tranexamic acid  (CYKLOKAPRON ) 1000 MG/10ML topical solution 500 mg (500 mg Topical Given 07/13/23 1348)  tranexamic acid  (CYKLOKAPRON ) 1000 MG/10ML nebulizer solution 500 mg (500 mg Nebulization Given 07/13/23 1535)                                    Medical Decision Making Risk OTC drugs. Prescription drug management.   78 year old male comes in with chief complaint of nosebleed.  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  Patient also has lung cancer for which she is on as needed oxygen.  It appears that the nosebleed is a new problem. No underlying infection or trauma.  Patient' nasal pathway was cleared in the ER.s  There after pressure was applied for about 5 minutes. Bleeding slowed, but not stopped.  2 sprays of Afrin were applied.   Direct pressure was applied again for 5 more minutes.  Bleeding significantly slowed or stopped at that time.  1 more round of Afrin given and then TXA nebulizer was initiated.  4:22 PM Pt was up for d/c @ 3 -however, he started slow dripping/bleeding. With pressure, the bleeding stopped again.  We gave him another round of TXA nebs.  We observe the patient for another hour.  On my reassessment, patient is not bleeding.  However the daughter indicates that during the treatment, there was some dripping still going on.  I discussed with him that we can  proceed with packing, but the packing will last for 3 days.  It can be uncomfortable.  Alternatively, since the bleeding at the very minimal has slowed down significantly, we can proceed with wait and watch approach and they can take Afrin with them and can squirt it as needed, maximum 3 times today.  Patient and the daughter discussed the options, and have decided to go home without packing for now.  Return precautions discussed. Not bleeding at the time of dc. Advised holding Eliquis  for 24 hours until bleeding has stopped.   5:40 PM Patient started rebleeding at the time of discharge. Merisel 10 cm packing placed. Keflex prescription given.  Return precautions discussed.  ENT f/u instructions discussed, but patient  received the paperwork from the 1st d/c w/o ENT number. Spoke with Dr. Virgia Griffins, and sent patient info. He will contact the patient about follow up.  Final diagnoses:  Epistaxis    ED Discharge Orders     None          Deatra Face, MD 07/13/23 1745

## 2023-07-13 NOTE — ED Triage Notes (Signed)
 Patient coming from home. Patient wife states nose bleed began at 0300 this morning and has gone through multiple tissues. Patient states he is short of breath. Patient is a cancer patient. Patient daughter states he was diagnosed with a blood clot in his LLE and began Eliquis  nine days ago. Patient daughter states she did not give Eliquis  this morning.

## 2023-07-13 NOTE — Discharge Instructions (Signed)
 You had a nose bleed which stopped in the ER with afrin, nebulizer and pressure hold. Nose bleeds can recur however, so please read the instructions below.  If the bleeding recurs, please apply direct pressure to the nose for 5 minutes straight, breathing from the mouth and spitting out any blood. After 5 minutes of holding pressure, if the bleeding continues, do the same thing again for 5 more minutes. If after 2nd round of holding pressure the bleeding persists - clear the nose and apply afrin spray. After applying afrin spray to the nares, hold pressure again for 5 minutes. If the bleeding continues despite these measures, apply cotton/gauze in the nose as a packing and hold for 5 min. If bleeding continues then come to the ER while holding direct pressure to the nares.  For now - please do not blow your nose, or put fingers in your nose to clear up any clots.

## 2023-07-16 ENCOUNTER — Other Ambulatory Visit: Payer: Self-pay

## 2023-07-16 ENCOUNTER — Ambulatory Visit: Admitting: Physician Assistant

## 2023-07-16 ENCOUNTER — Other Ambulatory Visit

## 2023-07-16 ENCOUNTER — Encounter

## 2023-07-16 ENCOUNTER — Ambulatory Visit

## 2023-07-16 DIAGNOSIS — R110 Nausea: Secondary | ICD-10-CM

## 2023-07-16 DIAGNOSIS — G893 Neoplasm related pain (acute) (chronic): Secondary | ICD-10-CM

## 2023-07-16 DIAGNOSIS — C3411 Malignant neoplasm of upper lobe, right bronchus or lung: Secondary | ICD-10-CM

## 2023-07-16 DIAGNOSIS — Z7189 Other specified counseling: Secondary | ICD-10-CM

## 2023-07-16 DIAGNOSIS — Z515 Encounter for palliative care: Secondary | ICD-10-CM

## 2023-07-16 MED ORDER — LORAZEPAM 0.5 MG PO TABS
0.5000 mg | ORAL_TABLET | Freq: Three times a day (TID) | ORAL | 0 refills | Status: DC | PRN
Start: 2023-07-16 — End: 2023-08-09

## 2023-07-16 MED ORDER — HYDROCODONE-ACETAMINOPHEN 5-325 MG PO TABS: 1.0000 | ORAL_TABLET | Freq: Four times a day (QID) | ORAL | 0 refills | Status: DC | PRN

## 2023-07-16 MED ORDER — ONDANSETRON HCL 4 MG PO TABS
4.0000 mg | ORAL_TABLET | Freq: Three times a day (TID) | ORAL | 2 refills | Status: DC | PRN
Start: 2023-07-16 — End: 2023-08-09

## 2023-07-17 ENCOUNTER — Ambulatory Visit (INDEPENDENT_AMBULATORY_CARE_PROVIDER_SITE_OTHER): Admitting: Physician Assistant

## 2023-07-17 ENCOUNTER — Encounter (INDEPENDENT_AMBULATORY_CARE_PROVIDER_SITE_OTHER): Payer: Self-pay | Admitting: Physician Assistant

## 2023-07-17 VITALS — BP 151/82 | HR 102

## 2023-07-17 DIAGNOSIS — R040 Epistaxis: Secondary | ICD-10-CM | POA: Diagnosis not present

## 2023-07-17 NOTE — Progress Notes (Signed)
 Dear Dr. Daivd Dub, Here is my assessment for our mutual patient, Jeffery Fernandez. Thank you for allowing me the opportunity to care for your patient. Please do not hesitate to contact me should you have any other questions. Sincerely, Belma Boxer PA-C  Otolaryngology Clinic Note Referring provider: Dr. Daivd Dub HPI:  Jeffery Fernandez is a 78 y.o. male kindly referred by Dr. Daivd Dub   The patient is an 78 year old gentleman seen in our office for evaluation of epistaxis.  He is accompanied by his daughter today who helps provide history.  She notes that he started on Eliquis  9 days prior to an episode of epistaxis on the left which occurred 5 days ago.  They note they attempted conservative treatment at home with direct pressure but were unable control the epistaxis.  He had taken the Eliquis  1 day prior to the bleed and has not restarted it since that time.  He was taking Eliquis  for DVT.  They went to the emergency room where they were seen and evaluated, he had bleeding from the left nare, no documented visualized source.  At that time Merisel packing was placed which resolved his epistaxis, he was given referral to our office for follow-up.  The patient denies any trauma to his nose, no digital trauma, he denies any other bleeding anywhere else.  He does note some seasonal allergies and takes Claritin , he had no recent upper respiratory infections.  No history of recurrent nosebleeds previous.  It was only bleeding out of the left side.     Independent Review of Additional Tests or Records:  ER visit note on 07/13/2023   PMH/Meds/All/SocHx/FamHx/ROS:   Past Medical History:  Diagnosis Date   Acid reflux    Arrhythmia    Arthritis    Cataract    bilateral cataract extraction with intraoccular lens implants   Chest pain, unspecified    CHF (congestive heart failure) (HCC)    Chronic airway obstruction, not elsewhere classified    PT. DENIES HE HAS COPD   CKD (chronic kidney disease)    Colon  polyps    Adenomatous Polyps 2007   Depression    Diabetes mellitus    Diverticulosis    Gout    Heart murmur    History of kidney stones    History of radiation therapy    Thoracic Spine- 01/27/23-02/10/23- Dr. Retta Caster   Hyperlipemia    Hypertension    Lung cancer (HCC) 12/2022   Nephrolithiasis    Normocytic anemia 04/23/2017   Obstructive sleep apnea (adult) (pediatric)    Other and unspecified hyperlipidemia    Other left bundle branch block    Retinal vascular occlusion, unspecified    right   Shortness of breath    on exertion   Unspecified sleep apnea    Vision loss of right eye    White coat syndrome with diagnosis of hypertension 09/19/2017     Past Surgical History:  Procedure Laterality Date   ANTERIOR CERVICAL DECOMP/DISCECTOMY FUSION N/A 06/26/2016   Procedure: Re-Exploration of Cervical Wound;  Surgeon: Gearl Keens, MD;  Location: [facility] OR;  Service: Neurosurgery;  Laterality: N/A;   ANTERIOR CERVICAL DECOMP/DISCECTOMY FUSION N/A 06/26/2016   Procedure: ANTERIOR CERVICAL DECOMPRESSION/DISCECTOMY FUSION, INTERBODY PROSTESIS, PLATE, CERVICAL THREE CERVICAL FOUR, CERVICAL FOUR CERVICAL FIVE CERVICAL SIX;  Surgeon: Garry Kansas, MD;  Location: [facility] OR;  Service: Neurosurgery;  Laterality: N/A;   APPLICATION OF WOUND VAC  06/05/2020   Procedure: APPLICATION OF WOUND VAC;  Surgeon: Garry Kansas, MD;  Location: [facility] OR;  Service: Neurosurgery;;   BACK SURGERY     CATARACT EXTRACTION Bilateral    FEMUR IM NAIL Right 05/06/2023   Procedure: RIGHT FEMUR NAILING;  Surgeon: Wilhelmenia Harada, MD;  Location: [facility] OR;  Service: Orthopedics;  Laterality: Right;  RIGHT INTERTROCHANTERIC INTRAMEDULLARY HIP NAIL   IR IMAGING GUIDED PORT INSERTION  02/19/2023   KIDNEY STONE SURGERY     KYPHOPLASTY N/A 01/19/2018   Procedure: KYPHOPLASTY LUMBAR THREE;  Surgeon: Garry Kansas, MD;  Location: [facility] OR;  Service: Neurosurgery;  Laterality: N/A;  KYPHOPLASTY LUMBAR THREE   LUMBAR WOUND  DEBRIDEMENT N/A 06/05/2020   Procedure: LUMBAR WOUND DEBRIDEMENT;  Surgeon: Garry Kansas, MD;  Location: [facility] OR;  Service: Neurosurgery;  Laterality: N/A;    Family History  Problem Relation Age of Onset   Cancer Mother    Uterine cancer Mother    Heart disease Father    Stroke Father    Cancer Sister        Lung cancer   Heart attack Brother 60       heart attack and CHF/ also some form of EP ablation   Parkinson's disease Brother    Atrial fibrillation Brother    Arthritis/Rheumatoid Cousin    Diabetes Other    Colon cancer Neg Hx    Stomach cancer Neg Hx    Liver cancer Neg Hx    Rectal cancer Neg Hx    Esophageal cancer Neg Hx      Social Connections: Patient Declined (05/06/2023)   Social Connection and Isolation Panel    Frequency of Communication with Friends and Family: Patient declined    Frequency of Social Gatherings with Friends and Family: Patient declined    Attends Religious Services: Patient declined    Database administrator or Organizations: Patient declined    Attends Banker Meetings: Patient declined    Marital Status: Patient declined      Current Outpatient Medications:    atorvastatin  (LIPITOR) 40 MG tablet, Take 40 mg by mouth at bedtime., Disp: , Rfl:    buPROPion  (WELLBUTRIN  XL) 150 MG 24 hr tablet, Take 150 mg by mouth every morning., Disp: , Rfl:    cephALEXin (KEFLEX) 500 MG capsule, Take 1 capsule (500 mg total) by mouth 4 (four) times daily., Disp: 20 capsule, Rfl: 0   famotidine  (PEPCID ) 20 MG tablet, Take 1 tablet (20 mg total) by mouth 2 (two) times daily. (Patient taking differently: Take 20 mg by mouth daily.), Disp: 60 tablet, Rfl: 3   gabapentin  (NEURONTIN ) 100 MG capsule, Take 1 capsule (100 mg total) by mouth daily., Disp: , Rfl:    HYDROcodone -acetaminophen  (NORCO/VICODIN) 5-325 MG tablet, Take 1 tablet by mouth every 6 (six) hours as needed for moderate pain (pain score 4-6)., Disp: 45 tablet, Rfl: 0   insulin  glargine  (LANTUS ) 100 UNIT/ML injection, Inject 41 Units into the skin in the morning., Disp: , Rfl:    loratadine  (CLARITIN ) 10 MG tablet, Take 10 mg by mouth daily., Disp: , Rfl:    LORazepam  (ATIVAN ) 0.5 MG tablet, Take 1 tablet (0.5 mg total) by mouth every 8 (eight) hours as needed for anxiety. 0.5 mg p.o. every 8 hours as needed for anxiety/nausea., Disp: 30 tablet, Rfl: 0   ondansetron  (ZOFRAN ) 4 MG tablet, Take 1 tablet (4 mg total) by mouth every 8 (eight) hours as needed for nausea or vomiting., Disp: 30 tablet, Rfl: 2   OXYGEN, Inhale 1.5 L into the lungs daily  as needed (Shortness of breath)., Disp: , Rfl:    prochlorperazine  (COMPAZINE ) 10 MG tablet, Take 1 tablet (10 mg total) by mouth every 6 (six) hours as needed for nausea or vomiting., Disp: 30 tablet, Rfl: 1   sertraline  (ZOLOFT ) 100 MG tablet, Take 100 mg by mouth in the morning., Disp: , Rfl:    APIXABAN  (ELIQUIS ) VTE STARTER PACK (10MG  AND 5MG ), Take as directed on package: start with two-5mg  tablets twice daily for 7 days. On day 8, switch to one-5mg  tablet twice daily., Disp: 74 each, Rfl: 0   Dulaglutide  (TRULICITY ) 1.5 MG/0.5ML SOAJ, Inject 1.5 mg into the skin daily. Takes on Thursdays, Disp: , Rfl:    Physical Exam:   BP (!) 151/82   Pulse (!) 102   SpO2 90%   Pertinent Findings  CN II-XII intact Bilateral EAC clear and TM intact with well pneumatized middle ear spaces Weber 512: equal Rinne 512: AC > BC b/l  Anterior rhinoscopy: Septum midline; left-sided nasal packing, once removed some irritation noted along the inferior and middle left septum, no significant vascularity or lesions, no epistaxis No lesions of oral cavity/oropharynx; no blood noted in the  oropharynx No obviously palpable neck masses/lymphadenopathy/thyromegaly No respiratory distress or stridor  Seprately Identifiable Procedures:  Procedure: Nasal packing removal Pre-procedure diagnosis: Left-sided epistaxis with nasal packing Post-procedure  diagnosis: same Indication: Removal of nasal packing  Procedure: Patient was placed semi-recumbent.  Mixture of Afrin and epinephrine  were sprayed on the nasal packing, the packing was gently removed, the area was visualized with speculum and headlight revealing some irritation along the right middle nasal septum, small amount of irritation along the left inferior turbinate, no epistaxis, no drainage, right nare visualized with no source of bleeding or irritation, posterior oropharynx clear with no epistaxis or clots.     Impression & Plans:  Dragon Frye is a 78 y.o. male with the following   Epistaxis-  78 year old gentleman seen in our office for evaluation of epistaxis.  I did remove the Merisel packing and monitored him in the office.  No recurrent bleed.  He had several areas of irritation uncertain exactly where the source of bleeding is but I do suspect it is along the middle left septum, the area along the inferior turban is likely irritation from the nasal packing.  The patient recently started on Eliquis  which likely precipitated this.  His blood pressure was 151/82 today which his daughter notes generally is high when he goes into clinical settings but does not have issues with managing blood pressure.  They have not restarted the Eliquis  and are hesitant to do so, given the treatment for DVT I have recommended they follow-up with the prescribing provider on restarting the medication, for my perspective they may restart the medication with the caveat that he may have recurrent bleeds.  If he does direct pressure at home, they may use some Afrin as well.  If they are unable to control the bleed they will reach out to our office or seek care in the emergency room.  I would like to see them back in the office in the next several weeks for repeat evaluation to assure no underlying lesions.  The patient's daughter verbalized understanding and agreement to today's plan.   - f/u 2 to 3 weeks for  repeat evaluation in the office   Thank you for allowing me the opportunity to care for your patient. Please do not hesitate to contact me should you have any other questions.  Sincerely, Belma Boxer PA-C [facility] Phone: [phone] Fax: [phone]  07/17/2023, 11:11 AM

## 2023-07-18 ENCOUNTER — Inpatient Hospital Stay: Admitting: Licensed Clinical Social Worker

## 2023-07-18 ENCOUNTER — Ambulatory Visit (HOSPITAL_COMMUNITY)
Admission: RE | Admit: 2023-07-18 | Discharge: 2023-07-18 | Disposition: A | Discharge status: Home / Self Care | Source: Ambulatory Visit | Attending: Physician Assistant | Admitting: Physician Assistant

## 2023-07-18 ENCOUNTER — Encounter (HOSPITAL_COMMUNITY): Payer: Self-pay

## 2023-07-18 DIAGNOSIS — C3411 Malignant neoplasm of upper lobe, right bronchus or lung: Secondary | ICD-10-CM | POA: Insufficient documentation

## 2023-07-18 DIAGNOSIS — M25512 Pain in left shoulder: Secondary | ICD-10-CM | POA: Insufficient documentation

## 2023-07-18 NOTE — Progress Notes (Signed)
 [facility] CSW Progress Note  Visual merchandiser spoke with pt's daughter over the phone to discuss supportive services available.  CSW added pt's spouse and daughter to the caregiver virtual support group.  Pt's daughter provided w/ contact details for CSW should she or her mother wish to schedule individual counseling.       Quenton Bruns, LCSW Clinical Social Worker [facility]

## 2023-07-22 NOTE — Progress Notes (Signed)
 [facility] OFFICE PROGRESS NOTE  Pura Lenis, MD [address] [place] [address]  DIAGNOSIS: Stage IV (T2b, N3, M1b) non-small cell lung cancer, adenocarcinoma . This was diagnosed in December 2024 and presented with large right apical lung mass in addition to right hilar, mediastinal and supraclavicular lymphadenopathy as well as pulmonary nodules in the right upper lobe and right lower lobe with T10 bone metastasis in addition to medial left inguinal lymph node versus subcutaneous nodule.    Biomarker Findings HRD signature - HRDsig Negative Microsatellite status - Cannot Be Determined ? Tumor Mutational Burden - 8 Muts/Mb Genomic Findings For a complete list of the genes assayed, please refer to the Appendix. CDKN2A loss KEAP1 K97fs*60 STK11 E291* CDKN2B loss IKZF1 R111* TP53 R280K 8 Disease relevant genes with no reportable alterations: ALK, BRAF, EGFR, ERBB2, KRAS, MET, RET, ROS1  PRIOR THERAPY: 1) Palliative radiotherapy to the T10 bone metastasis  2) Radiation to right femur under the care of Dr. Shannon completed on 06/10/23  CURRENT THERAPY: First-line treatment with chemoimmunotherapy in the form of carboplatin  for AUC of 5 and paclitaxel  175 Mg/M2 on days 1 and 22 in addition to ipilimumab  1 Mg/KG on day 1 and nivolumab  360 Mg IV on days 1 and 22 for 1 cycle followed by maintenance treatment with ipilimumab  1 Mg/KG every 6 weeks and nivolumab  360 Mg IV every 3 weeks. First dose February 18, 2023. Chemotherapy was discontinued starting from day 22 of cycle #1 secondary to intolerance.   INTERVAL HISTORY: Najeh H Tsosie 78 y.o. male returns to the clinic today for a follow up visit accompanied by his daughter. The patient was last seen by myself on 07/04/23.   At that point in time, he was sent for US  of his leg due to left leg swelling which showed DVT. He was started on eliquis . He presented to the ER 9 days later with epistaxis. He  already struggled with senile purpura prior to the blood thinner. He denies any other bleeding. He saw ENT on 6/19. They did not resume the eliquis  due to being fearful of recurrent bleeding. He was previously on aspirin  but this was held about 1 month prior to his last appointment due to a procedure and had not been resumed. His left leg swelling has improved.   He experiences nausea and vomiting almost daily, which began after starting treatment for his current condition. He takes Zofran  regularly for nausea and Pepcid  for reflux, which has been ongoing since before treatment. Nausea and vomiting are worse after lying down and then getting up, and he experiences dry heaves when rolling onto his left side.  He has been experiencing hallucinations and memory issues, which have worsened since his condition began. He often calls his daughter by the wrong name and has difficulty with memory tasks. A recent (April 2025) brain MRI showed brain atrophy, but no cancer was detected.  He has a sore on his hip that was initially suspected to be shingles but has since scabbed over and is healing. He sleeps on his right side due to nausea when lying on his left side.  He often feels chilly but denies having fevers, chills, or night sweats. No chest discomfort beyond the post-procedure pain and no other rashes besides the hip sore.  Had a restaging CT scan recently that showed bilateral pleural effusions the right being larger than the left.  Therefore today he underwent a thoracentesis which obtained 1.6 L of yellow  fluid.  He has not required thoracentesis in the past.  He may cough intermittently that comes and goes.  He is established care with palliative care and saw them on 07/16/23. He is scheduled to see them today.   He also is having left shoulder pain. Therefore, CT shoulder was added to his restaging imaging.  He recently had a restaging CT scan of the chest, abdomen, pelvis, and left shoulder.  The  patient is here today for evaluation prior to starting cycle #4    MEDICAL HISTORY: Past Medical History:  Diagnosis Date   Acid reflux    Arrhythmia    Arthritis    Cataract    bilateral cataract extraction with intraoccular lens implants   Chest pain, unspecified    CHF (congestive heart failure) (HCC)    Chronic airway obstruction, not elsewhere classified    PT. DENIES HE HAS COPD   CKD (chronic kidney disease)    Colon polyps    Adenomatous Polyps 2007   Depression    Diabetes mellitus    Diverticulosis    Gout    Heart murmur    History of kidney stones    History of radiation therapy    Thoracic Spine- 01/27/23-02/10/23- Dr. Lynwood Nasuti   Hyperlipemia    Hypertension    Lung cancer (HCC) 12/2022   Nephrolithiasis    Normocytic anemia 04/23/2017   Obstructive sleep apnea (adult) (pediatric)    Other and unspecified hyperlipidemia    Other left bundle branch block    Retinal vascular occlusion, unspecified    right   Shortness of breath    on exertion   Unspecified sleep apnea    Vision loss of right eye    White coat syndrome with diagnosis of hypertension 09/19/2017    ALLERGIES:  is allergic to amlodipine , penicillins, and hydralazine .  MEDICATIONS:  Current Outpatient Medications  Medication Sig Dispense Refill   APIXABAN  (ELIQUIS ) VTE STARTER PACK (10MG  AND 5MG ) Take as directed on package: start with two-5mg  tablets twice daily for 7 days. On day 8, switch to one-5mg  tablet twice daily. 74 each 0   atorvastatin  (LIPITOR) 40 MG tablet Take 40 mg by mouth at bedtime.     buPROPion  (WELLBUTRIN  XL) 150 MG 24 hr tablet Take 150 mg by mouth every morning.     cephALEXin  (KEFLEX ) 500 MG capsule Take 1 capsule (500 mg total) by mouth 4 (four) times daily. 20 capsule 0   Dulaglutide  (TRULICITY ) 1.5 MG/0.5ML SOAJ Inject 1.5 mg into the skin daily. Takes on Thursdays     famotidine  (PEPCID ) 20 MG tablet Take 1 tablet (20 mg total) by mouth 2 (two) times daily.  (Patient taking differently: Take 20 mg by mouth daily.) 60 tablet 3   gabapentin  (NEURONTIN ) 100 MG capsule Take 1 capsule (100 mg total) by mouth daily.     HYDROcodone -acetaminophen  (NORCO/VICODIN) 5-325 MG tablet Take 1 tablet by mouth every 6 (six) hours as needed for moderate pain (pain score 4-6). 45 tablet 0   insulin  glargine (LANTUS ) 100 UNIT/ML injection Inject 41 Units into the skin in the morning.     loratadine  (CLARITIN ) 10 MG tablet Take 10 mg by mouth daily.     LORazepam  (ATIVAN ) 0.5 MG tablet Take 1 tablet (0.5 mg total) by mouth every 8 (eight) hours as needed for anxiety. 0.5 mg p.o. every 8 hours as needed for anxiety/nausea. 30 tablet 0   ondansetron  (ZOFRAN ) 4 MG tablet Take 1 tablet (4 mg total)  by mouth every 8 (eight) hours as needed for nausea or vomiting. 30 tablet 2   OXYGEN Inhale 1.5 L into the lungs daily as needed (Shortness of breath).     prochlorperazine  (COMPAZINE ) 10 MG tablet Take 1 tablet (10 mg total) by mouth every 6 (six) hours as needed for nausea or vomiting. 30 tablet 1   sertraline  (ZOLOFT ) 100 MG tablet Take 100 mg by mouth in the morning.     No current facility-administered medications for this visit.    SURGICAL HISTORY:  Past Surgical History:  Procedure Laterality Date   ANTERIOR CERVICAL DECOMP/DISCECTOMY FUSION N/A 06/26/2016   Procedure: Re-Exploration of Cervical Wound;  Surgeon: Onetha Kuba, MD;  Location: [facility] OR;  Service: Neurosurgery;  Laterality: N/A;   ANTERIOR CERVICAL DECOMP/DISCECTOMY FUSION N/A 06/26/2016   Procedure: ANTERIOR CERVICAL DECOMPRESSION/DISCECTOMY FUSION, INTERBODY PROSTESIS, PLATE, CERVICAL THREE CERVICAL FOUR, CERVICAL FOUR CERVICAL FIVE CERVICAL SIX;  Surgeon: Mavis Purchase, MD;  Location: [facility] OR;  Service: Neurosurgery;  Laterality: N/A;   APPLICATION OF WOUND VAC  06/05/2020   Procedure: APPLICATION OF WOUND VAC;  Surgeon: Mavis Purchase, MD;  Location: [facility] OR;  Service: Neurosurgery;;   BACK SURGERY      CATARACT EXTRACTION Bilateral    FEMUR IM NAIL Right 05/06/2023   Procedure: RIGHT FEMUR NAILING;  Surgeon: Genelle Standing, MD;  Location: [facility] OR;  Service: Orthopedics;  Laterality: Right;  RIGHT INTERTROCHANTERIC INTRAMEDULLARY HIP NAIL   IR IMAGING GUIDED PORT INSERTION  02/19/2023   KIDNEY STONE SURGERY     KYPHOPLASTY N/A 01/19/2018   Procedure: KYPHOPLASTY LUMBAR THREE;  Surgeon: Mavis Purchase, MD;  Location: [facility] OR;  Service: Neurosurgery;  Laterality: N/A;  KYPHOPLASTY LUMBAR THREE   LUMBAR WOUND DEBRIDEMENT N/A 06/05/2020   Procedure: LUMBAR WOUND DEBRIDEMENT;  Surgeon: Mavis Purchase, MD;  Location: [facility] OR;  Service: Neurosurgery;  Laterality: N/A;    REVIEW OF SYSTEMS:   Review of Systems  Constitutional: + For fatigue.  Negative for appetite change, chills, fever and unexpected weight change.  HENT:  Negative for mouth sores, nosebleeds (none at this time), sore throat and trouble swallowing.   Eyes: Negative for eye problems and icterus.  Respiratory: Positive for shortness of breath and intermittent cough.  Negative for hemoptysis and wheezing.   Cardiovascular: Negative for chest pain and leg swelling (improved).  Gastrointestinal: Positive for nausea/vomiting. Negative for abdominal pain, constipation, and diarrhea. Genitourinary: Negative for bladder incontinence, difficulty urinating, dysuria, frequency and hematuria.   Musculoskeletal: Positive for shoulder pain. Negative for back pain, gait problem, neck pain and neck stiffness.  Skin: Positive for healing scab over right hip. Negative for itching and rash.  Neurological: Negative for dizziness, extremity weakness, gait problem, headaches, light-headedness and seizures.  Hematological: Negative for adenopathy. Bruises and bleeds easily.  Psychiatric/Behavioral: Positive for intermittent confusion. Negative for  depression and sleep disturbance. The patient is not nervous/anxious.     PHYSICAL EXAMINATION:  There were no  vitals taken for this visit.  ECOG PERFORMANCE STATUS: 2  Physical Exam  Constitutional: Oriented to person, place, and time and elderly appearing male, and in no distress.  HENT:  Head: Normocephalic and atraumatic.  Mouth/Throat: Oropharynx is clear and moist. No oropharyngeal exudate.  Eyes: Conjunctivae are normal. Right eye exhibits no discharge. Left eye exhibits no discharge. No scleral icterus.  Neck: Normal range of motion. Neck supple.  Cardiovascular: Normal rate, regular rhythm, normal heart sounds and intact distal pulses.   Pulmonary/Chest: Effort normal. Quiet breath sounds bilaterally.  No respiratory distress. No wheezes. No rales.  Abdominal: Soft. Bowel sounds are normal. Exhibits no distension and no mass. There is no tenderness.  Musculoskeletal: Normal range of motion. Exhibits no edema.  Lymphadenopathy:    No cervical adenopathy.  Neurological: Alert and oriented to person, place, and time. Exhibits muscle wasting. He was examined in the wheelchair.  Skin: Skin is warm and dry. Positive for senile purpura. No rash noted. Not diaphoretic. No erythema. No pallor.  Psychiatric: Mood, memory and judgment normal.  Vitals reviewed.  LABORATORY DATA: Lab Results  Component Value Date   WBC 6.8 07/04/2023   HGB 10.2 (L) 07/04/2023   HCT 31.2 (L) 07/04/2023   MCV 92.6 07/04/2023   PLT 179 07/04/2023      Chemistry      Component Value Date/Time   NA 138 07/04/2023 0925   NA 142 07/03/2022 1356   K 4.0 07/04/2023 0925   CL 105 07/04/2023 0925   CO2 27 07/04/2023 0925   BUN 29 (H) 07/04/2023 0925   BUN 22 07/03/2022 1356   CREATININE 2.47 (H) 07/04/2023 0925      Component Value Date/Time   CALCIUM  8.7 (L) 07/04/2023 0925   ALKPHOS 105 07/04/2023 0925   AST 13 (L) 07/04/2023 0925   ALT 10 07/04/2023 0925   BILITOT 0.5 07/04/2023 0925       RADIOGRAPHIC STUDIES:  VAS US  LOWER EXTREMITY VENOUS (DVT) Result Date: 07/04/2023  Lower Venous DVT Study  Patient Name:  REINHART SAULTERS  Date of Exam:   07/04/2023 Medical Rec #: [id number]      Accession #:    [id number] Date of Birth: Jul 24, 1945       Patient Gender: M Patient Age:   22 years Exam Location:  [address] Procedure:      VAS US  LOWER EXTREMITY VENOUS (DVT) Referring Phys: CALTON Ripken Rekowski --------------------------------------------------------------------------------  Indications: Swelling.  Risk Factors: Cancer Metastasis to the femur from stage IV adenocarcinoma, NSCLC, of the RUL. Performing Technologist: [place] RVT  Examination Guidelines: A complete evaluation includes B-mode imaging, spectral Doppler, color Doppler, and power Doppler as needed of all accessible portions of each vessel. Bilateral testing is considered an integral part of a complete examination. Limited examinations for reoccurring indications may be performed as noted. The reflux portion of the exam is performed with the patient in reverse Trendelenburg.  +---------+---------------+---------+-----------+----------+--------------+ LEFT     CompressibilityPhasicitySpontaneityPropertiesThrombus Aging +---------+---------------+---------+-----------+----------+--------------+ CFV      Full           Yes      Yes                                 +---------+---------------+---------+-----------+----------+--------------+ SFJ      Full                    Yes                                 +---------+---------------+---------+-----------+----------+--------------+ FV Prox  Full           Yes      Yes                                 +---------+---------------+---------+-----------+----------+--------------+ FV Mid   Full  Yes      Yes                                 +---------+---------------+---------+-----------+----------+--------------+ FV DistalFull           Yes      Yes                                  +---------+---------------+---------+-----------+----------+--------------+ POP      Full           Yes      Yes                                 +---------+---------------+---------+-----------+----------+--------------+ PTV      None                    No                                  +---------+---------------+---------+-----------+----------+--------------+ PERO                             Yes                                 +---------+---------------+---------+-----------+----------+--------------+ GSV      Full           Yes      Yes                                 +---------+---------------+---------+-----------+----------+--------------+  Findings reported to [facility] at 3:36pm.  Summary: RIGHT: - Age indeterminate DVT in one of the paired posterior tibial veins. All other veins appear patent.   *See table(s) above for measurements and observations. Electronically signed by Norman Serve on 07/04/2023 at 4:05:30 PM.    Final      ASSESSMENT/PLAN:  This is a very pleasant 78 year old Caucasian male diagnosed with stage IV (T2b, N3, M1 B) non-small cell lung cancer, adenocarcinoma. He was diagnosed in December 2024. He presented with a large right apical lung mass in addition to right hilar, mediastinal, and supraclavicular lymphadenopathy as well as pulmonary nodules, T10 metastatic bone lesion in addition to a subcutaneous nodule. He has no actionable mutation and there was insufficient material for PD-L1 expression.    He completed palliative radiation to the T10 vertebral body lesion.    He also completed palliative radiation to the right femur.    For systemic treatment he is currently on first-line chemoimmunotherapy with carboplatin  for an AUC of 5 and paclitaxel  175 mg/m on days 1 and 22 in addition to ipilimumab  1 mg/kg on day 1 and nivolumab  360 mg on days 1 and 22.  This will be followed by maintenance treatment with ipilimumab  every 6 weeks and nivolumab  every 3  weeks.  His first dose was on 02/18/2023.  He is status post 3 cycles of chemotherapy. He had a challenging time with chemotherapy and this portion of his treatment was discontinued starting from day 22 cycle #1   He was found to have a DVT at his last  appointment.  He was started on Eliquis  but had epistaxis and has since stopped this medication.   The patient was seen with Dr. Sherrod today.  Dr. Sherrod personally and independently reviewed the scan and discussed results with the patient today.  The scan showed increasing masslike consolidation with air bronchograms in the right upper lobe which corresponds to the previously seen tracer avid mass favored to represent combination of patient's known lung mass and postobstructive pneumonia.  There is also new moderate to large right pleural effusion and small to moderate left pleural effusion.  The sclerotic lesion at T10 is stable.  There was no new sites of metastatic disease..  Dr. Sherrod recommends continuing him on the same treatment at this time. If intolerance, he likely would not be a good canidate for alternative therapies due to his performance status.   The patient had 1.6 L of fluid drawn off his right lung today via thoracentesis.  I did review signs and symptoms of pleural effusion with the patient and his daughter.  They know that they are welcome to call us  if he has signs and symptoms of recurrent effusion that would warrant reevaluation and possible thoracentesis.  The patient scan showed the possible postobstructive changes.  Therefore doxycycline  was sent to the pharmacy.  Dr. Sherrod had a risk-benefit discussion with the patient and his daughter regarding the blood clot in his leg.  The patient had recent epistaxis.  Discussed resuming Eliquis  at the 5 mg twice daily dosing.  We also discussed IVC filter if he was not going to resume blood thinner.  The patient had epistaxis after having the initial 7-day dosing of 10 mg twice daily.   Also discussed Eliquis  5 mg twice daily versus 2.5 mg twice daily.  After discussion the patient's daughter did decide to resume taking the 5 mg twice daily.  I sent another prescription to the pharmacy for when he completes his current dose.  We discussed if he develops any concerning bleeding to hold this medicine for a few days and contact us  and we would consider starting the 2.5 mg twice daily dosing.  The patient will proceed with cycle #4 today as scheduled.  He is going to try either increasing his Pepcid  or starting Prilosec as the patient also gets nauseous with laying down and laying on his left side and he may have some reflux.  Encouraged the patient to follow-up with his PCP regarding his dementia.  Had a brain MRI in April for this concern and there was no evidence of metastatic disease at that time.  We will see him back for a follow-up visit in 3 weeks before undergoing cycle #5.  Continue to follow with palliative care.  Scheduled to see them in the infusion room today as well as nutrition.  The patient was advised to call immediately if he has any concerning symptoms in the interval. The patient voices understanding of current disease status and treatment options and is in agreement with the current care plan. All questions were answered. The patient knows to call the clinic with any problems, questions or concerns. We can certainly see the patient much sooner if necessary  No orders of the defined types were placed in this encounter.     Domingo Fuson L Syriana Croslin, PA-C 07/22/23  ADDENDUM: Hematology/Oncology Attending: I had a face-to-face encounter with the patient today.  I reviewed his records, lab, scan and recommended his care plan.  This is a very pleasant 78 years old white  male with a stage IV non-small cell lung cancer, adenocarcinoma diagnosed in December 2024 with no actionable mutations.  The patient started first-line chemoimmunotherapy with 1 cycle of  carboplatin , paclitaxel , ipilimumab  and nivolumab  that he could not tolerate the systemic chemotherapy and starting from day 22 of cycle #1 he switch it to maintenance immunotherapy.  He is status post 4 cycles.  He has been tolerating his treatment fairly well but recently has been complaining of increasing fatigue and weakness and memory issues.  He also has some nausea and vomiting almost on daily basis but he takes his treatment with Zofran  with some improvement.  He had repeat CT scan of the chest, abdomen and pelvis performed recently.  I personally and independently reviewed the scan and discussed the results with the patient and his daughter.  His scan showed increasing masslike consolidation with air bronchogram in the right upper lobe favored a combination of the known lung mass and/or postobstructive pneumonia.  There was new moderate to large right pleural effusion which we arrange for the patient to have ultrasound-guided right thoracentesis earlier today and the hide with drainage of 1.6 L of fluids before the visit. For the suspicious inflammatory process in the right lung, will give the patient a course of antibiotic with doxycycline . For the history of deep venous thrombosis, I recommended for him to resume his anticoagulation but there is concern about epistaxis and hemorrhage and the patient and his daughter agreed to a reduced dose Eliquis  2.5 mg p.o. twice daily with close monitoring for any bleeding issues. I recommended for the patient to continue his current treatment with maintenance immunotherapy with ipilimumab  and nivolumab  and he will proceed with day 22 of cycle #4 today. We will see the patient back for follow-up visit in 3 weeks for evaluation before starting day 1 of cycle #5. The patient was advised to call immediately if he has any concerning symptoms in the interval. The total time spent in the appointment was 30 minutes including review of chart and various tests results,  discussions about plan of care and coordination of care plan . Disclaimer: This note was dictated with voice recognition software. Similar sounding words can inadvertently be transcribed and may be missed upon review.  Sherrod MARLA Sherrod, MD

## 2023-07-23 ENCOUNTER — Encounter: Payer: Self-pay | Admitting: Internal Medicine

## 2023-07-24 ENCOUNTER — Inpatient Hospital Stay

## 2023-07-24 ENCOUNTER — Ambulatory Visit (HOSPITAL_COMMUNITY)
Admission: RE | Admit: 2023-07-24 | Discharge: 2023-07-24 | Disposition: A | Discharge status: Home / Self Care | Source: Ambulatory Visit | Attending: Radiology | Admitting: Radiology

## 2023-07-24 ENCOUNTER — Inpatient Hospital Stay: Admitting: Dietician

## 2023-07-24 ENCOUNTER — Ambulatory Visit (HOSPITAL_COMMUNITY)
Admission: RE | Admit: 2023-07-24 | Discharge: 2023-07-24 | Disposition: A | Discharge status: Home / Self Care | Source: Ambulatory Visit | Attending: Physician Assistant | Admitting: Physician Assistant

## 2023-07-24 ENCOUNTER — Other Ambulatory Visit: Payer: Self-pay

## 2023-07-24 ENCOUNTER — Other Ambulatory Visit (HOSPITAL_COMMUNITY): Payer: Self-pay | Admitting: Radiology

## 2023-07-24 ENCOUNTER — Telehealth: Payer: Self-pay

## 2023-07-24 ENCOUNTER — Inpatient Hospital Stay: Admitting: Physician Assistant

## 2023-07-24 ENCOUNTER — Inpatient Hospital Stay (HOSPITAL_BASED_OUTPATIENT_CLINIC_OR_DEPARTMENT_OTHER): Admitting: Nurse Practitioner

## 2023-07-24 VITALS — BP 125/85 | HR 97 | Temp 98.1°F | Resp 16 | Wt 158.9 lb

## 2023-07-24 DIAGNOSIS — K5903 Drug induced constipation: Secondary | ICD-10-CM

## 2023-07-24 DIAGNOSIS — R634 Abnormal weight loss: Secondary | ICD-10-CM

## 2023-07-24 DIAGNOSIS — C3411 Malignant neoplasm of upper lobe, right bronchus or lung: Secondary | ICD-10-CM

## 2023-07-24 DIAGNOSIS — Z5112 Encounter for antineoplastic immunotherapy: Secondary | ICD-10-CM

## 2023-07-24 DIAGNOSIS — Z515 Encounter for palliative care: Secondary | ICD-10-CM | POA: Diagnosis not present

## 2023-07-24 DIAGNOSIS — R630 Anorexia: Secondary | ICD-10-CM

## 2023-07-24 DIAGNOSIS — J189 Pneumonia, unspecified organism: Secondary | ICD-10-CM | POA: Diagnosis not present

## 2023-07-24 DIAGNOSIS — Z9889 Other specified postprocedural states: Secondary | ICD-10-CM

## 2023-07-24 DIAGNOSIS — I82409 Acute embolism and thrombosis of unspecified deep veins of unspecified lower extremity: Secondary | ICD-10-CM | POA: Insufficient documentation

## 2023-07-24 DIAGNOSIS — R0602 Shortness of breath: Secondary | ICD-10-CM

## 2023-07-24 DIAGNOSIS — Z95828 Presence of other vascular implants and grafts: Secondary | ICD-10-CM

## 2023-07-24 DIAGNOSIS — J90 Pleural effusion, not elsewhere classified: Secondary | ICD-10-CM | POA: Insufficient documentation

## 2023-07-24 DIAGNOSIS — C7951 Secondary malignant neoplasm of bone: Secondary | ICD-10-CM

## 2023-07-24 DIAGNOSIS — G893 Neoplasm related pain (acute) (chronic): Secondary | ICD-10-CM | POA: Diagnosis not present

## 2023-07-24 DIAGNOSIS — I824Y2 Acute embolism and thrombosis of unspecified deep veins of left proximal lower extremity: Secondary | ICD-10-CM | POA: Diagnosis not present

## 2023-07-24 DIAGNOSIS — C3490 Malignant neoplasm of unspecified part of unspecified bronchus or lung: Secondary | ICD-10-CM

## 2023-07-24 LAB — CMP (CANCER CENTER ONLY)
ALT: 10 U/L (ref 0–44)
AST: 16 U/L (ref 15–41)
Albumin: 3.3 g/dL — ABNORMAL LOW (ref 3.5–5.0)
Alkaline Phosphatase: 107 U/L (ref 38–126)
Anion gap: 7 (ref 5–15)
BUN: 24 mg/dL — ABNORMAL HIGH (ref 8–23)
CO2: 27 mmol/L (ref 22–32)
Calcium: 8.8 mg/dL — ABNORMAL LOW (ref 8.9–10.3)
Chloride: 105 mmol/L (ref 98–111)
Creatinine: 2.61 mg/dL — ABNORMAL HIGH (ref 0.61–1.24)
GFR, Estimated: 24 mL/min — ABNORMAL LOW (ref >60)
Glucose, Bld: 77 mg/dL (ref 70–99)
Potassium: 4 mmol/L (ref 3.5–5.1)
Sodium: 139 mmol/L (ref 135–145)
Total Bilirubin: 0.5 mg/dL (ref 0.0–1.2)
Total Protein: 7.2 g/dL (ref 6.5–8.1)

## 2023-07-24 LAB — CBC WITH DIFFERENTIAL (CANCER CENTER ONLY)
Abs Immature Granulocytes: 0.02 10*3/uL (ref 0.00–0.07)
Basophils Absolute: 0.1 10*3/uL (ref 0.0–0.1)
Basophils Relative: 1 %
Eosinophils Absolute: 0.1 10*3/uL (ref 0.0–0.5)
Eosinophils Relative: 1 %
HCT: 31.6 % — ABNORMAL LOW (ref 39.0–52.0)
Hemoglobin: 10.4 g/dL — ABNORMAL LOW (ref 13.0–17.0)
Immature Granulocytes: 0 %
Lymphocytes Relative: 16 %
Lymphs Abs: 1.2 10*3/uL (ref 0.7–4.0)
MCH: 30.8 pg (ref 26.0–34.0)
MCHC: 32.9 g/dL (ref 30.0–36.0)
MCV: 93.5 fL (ref 80.0–100.0)
Monocytes Absolute: 0.6 10*3/uL (ref 0.1–1.0)
Monocytes Relative: 8 %
Neutro Abs: 5.5 10*3/uL (ref 1.7–7.7)
Neutrophils Relative %: 74 %
Platelet Count: 191 10*3/uL (ref 150–400)
RBC: 3.38 MIL/uL — ABNORMAL LOW (ref 4.22–5.81)
RDW: 17.2 % — ABNORMAL HIGH (ref 11.5–15.5)
WBC Count: 7.5 10*3/uL (ref 4.0–10.5)
nRBC: 0 % (ref 0.0–0.2)

## 2023-07-24 MED ORDER — SODIUM CHLORIDE 0.9% FLUSH
10.0000 mL | INTRAVENOUS | Status: DC | PRN
  Administered 2023-07-24: 10 mL

## 2023-07-24 MED ORDER — SODIUM CHLORIDE 0.9% FLUSH
10.0000 mL | Freq: Once | INTRAVENOUS | Status: AC
  Administered 2023-07-24: 10 mL

## 2023-07-24 MED ORDER — SODIUM CHLORIDE 0.9 % IV SOLN
360.0000 mg | Freq: Once | INTRAVENOUS | Status: AC
  Administered 2023-07-24: 360 mg via INTRAVENOUS
  Filled 2023-07-24: qty 24

## 2023-07-24 MED ORDER — SODIUM CHLORIDE 0.9 % IV SOLN: INTRAVENOUS | Status: DC

## 2023-07-24 MED ORDER — LIDOCAINE HCL 1 % IJ SOLN
INTRAMUSCULAR | Status: AC
Start: 2023-07-24 — End: 2023-07-24
  Filled 2023-07-24: qty 20

## 2023-07-24 MED ORDER — DOXYCYCLINE HYCLATE 100 MG PO TABS: 100.0000 mg | ORAL_TABLET | Freq: Two times a day (BID) | ORAL | 0 refills | Status: DC

## 2023-07-24 MED ORDER — HEPARIN SOD (PORK) LOCK FLUSH 100 UNIT/ML IV SOLN
500.0000 [IU] | Freq: Once | INTRAVENOUS | Status: AC | PRN
  Administered 2023-07-24: 500 [IU]

## 2023-07-24 MED ORDER — APIXABAN 5 MG PO TABS: 5.0000 mg | ORAL_TABLET | Freq: Two times a day (BID) | ORAL | 2 refills | Status: DC

## 2023-07-24 NOTE — Progress Notes (Signed)
 Per C. Heilingoetter NP, ok to treat with creatnine of 2.61 today

## 2023-07-24 NOTE — Progress Notes (Signed)
 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 Patient completed palliative radiation to right femur 5/13 under care of Dr. Shannon. He is currently receiving opdivo  + yervoy  q42d. Patient is under the care of Dr. Sherrod  Past medical history includes CAD, HTN, CHF with preserved EF, LBBB, DVT of LE, OSA, COPD, ILD, IDDM2, diabetic retinopathy, HLD, hypogonadism, DDD, spondylosis, CKD4, anemia,   6/26 underwent therapeutic/diagnostic thoracenteses for right pleural effusion - yield 1.6 L  Met with pt and daughter in infusion. He reports breathing is some better s/p thora this morning. Pt has not eaten today and feeling hungry at visit. Infusion RN brought chips and graham crackers for snack. Per daughter, pt has had poor appetite at home. Eating one small meal (1/2 sandwich). Has been eating a lot of fruit (watermelon, cantaloupe, apples). Pt likes Ensure, but not drinking regularly. Pt denies constipation, diarrhea, vomiting. Daughter reports daily nausea that starts in the morning. Antiemetics are working well.    Nutrition Focused Physical Exam:   Orbital Region: severe Buccal Region: severe Upper Arm Region: severe Thoracic and Lumbar Region: moderate Temple Region: moderate Clavicle Bone Region: severe Shoulder and Acromion Bone Region: moderate Dorsal Hand: severe Hair: reviewed  Eyes: reviewed  Mouth: reviewed (poor detention)  Skin: scattered ecchymosis Nails: reviewed    Medications: eliquis , lipitor, wellbutrin , trulicity , pepcid , gabapentin , norco, lantus , ativan , zofran , compazine , zoloft    Labs: BUN 24, Cr 2.61, albumin  3.3   Anthropometrics:   Height: 5'10 Weight: 158 lb 14.4 oz  UBW: 210 lb (prior to diagnosis per daughter - per chart pt wt 208-210 lb in 2019) BMI: 22.80   Estimated Energy Needs  Kcals: 2150-2300 Protein: 86-100 Fluid: >1.8 L   NUTRITION DIAGNOSIS: Unintended wt loss  related to cancer as evidenced by worsening shortness of breath related to malignant pleural effusions, 7% wt loss in the last 7 weeks. Per chart, pt 170 lb 9.6 oz on 5/6   MALNUTRITION DIAGNOSIS: Pt meets criteria for severe malnutrition related to chronic illness as evidenced by moderate/severe fat and muscle depletion, severe wt loss    INTERVENTION:  Educated on strategies for increasing calories and protein with small frequent meals/snacks q2-3h - handout with snack ideas provided Encourage soft moist high protein foods for ease of intake - handout provided Recommend 1-2 Ensure Plus/CIB with whole milk - samples + coupons Contact information given   MONITORING, EVALUATION, GOAL: Pt will tolerate increased calories and protein to promote wt gain    Next Visit: Wednesday July 17 during infusion

## 2023-07-24 NOTE — Procedures (Signed)
 PROCEDURE SUMMARY:  Successful US  guided right thoracentesis. Yielded 1.6 L of clear, yellow fluid. Patient tolerated procedure well. No immediate complications. EBL = trace  Specimen was sent for labs.  Post procedure chest X-ray reveals no pneumothorax  Lavanda JAYSON Jurist PA-C 07/24/2023 2:37 PM

## 2023-07-24 NOTE — Progress Notes (Signed)
 I am    Palliative Medicine [facility]  [phone] [phone]   Name: Jeffery Fernandez Date: 07/24/2023 MRN: [id number]  DOB: November 28, 1945  Patient Care Team: Pura Lenis, MD as PCP - General (Family Medicine) Raford Riggs, MD as PCP - Cardiology (Cardiology) Pickenpack-Cousar, Fannie SAILOR, NP as Nurse Practitioner (Hospice and Palliative Medicine)    INTERVAL HISTORY: SINGLETON HICKOX is a 78 y.o. 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 As well as COPD, DM type 2, arthritis, hypertension, and hyperlipidemia. Palliative ask to see for symptom management and goals of care.   SOCIAL HISTORY:     reports that he quit smoking about 37 years ago. His smoking use included cigarettes. He started smoking about 62 years ago. He has a 37.5 pack-year smoking history. He has never used smokeless tobacco. He reports that he does not currently use alcohol. He reports that he does not use drugs.  ADVANCE DIRECTIVES:  Advanced directives on file name Jung Yurchak as the primary and Icarus Partch as secondary decision makers should the patient become unable to make his own decisions.  CODE STATUS: Full code  PAST MEDICAL HISTORY: Past Medical History:  Diagnosis Date   Acid reflux    Arrhythmia    Arthritis    Cataract    bilateral cataract extraction with intraoccular lens implants   Chest pain, unspecified    CHF (congestive heart failure) (HCC)    Chronic airway obstruction, not elsewhere classified    PT. DENIES HE HAS COPD   CKD (chronic kidney disease)    Colon polyps    Adenomatous Polyps 2007   Depression    Diabetes mellitus    Diverticulosis    Gout    Heart murmur    History of kidney stones    History of radiation therapy    Thoracic Spine- 01/27/23-02/10/23- Dr. Lynwood Nasuti   Hyperlipemia    Hypertension    Lung cancer (HCC) 12/2022   Nephrolithiasis    Normocytic anemia 04/23/2017    Obstructive sleep apnea (adult) (pediatric)    Other and unspecified hyperlipidemia    Other left bundle branch block    Retinal vascular occlusion, unspecified    right   Shortness of breath    on exertion   Unspecified sleep apnea    Vision loss of right eye    White coat syndrome with diagnosis of hypertension 09/19/2017    ALLERGIES:  is allergic to amlodipine , penicillins, and hydralazine .  MEDICATIONS:  Current Outpatient Medications  Medication Sig Dispense Refill   apixaban  (ELIQUIS ) 5 MG TABS tablet Take 1 tablet (5 mg total) by mouth 2 (two) times daily. 60 tablet 2   atorvastatin  (LIPITOR) 40 MG tablet Take 40 mg by mouth at bedtime.     buPROPion  (WELLBUTRIN  XL) 150 MG 24 hr tablet Take 150 mg by mouth every morning.     doxycycline  (VIBRA -TABS) 100 MG tablet Take 1 tablet (100 mg total) by mouth 2 (two) times daily. 20 tablet 0   Dulaglutide  (TRULICITY ) 1.5 MG/0.5ML SOAJ Inject 1.5 mg into the skin daily. Takes on Thursdays     famotidine  (PEPCID ) 20 MG tablet Take 1 tablet (20 mg total) by mouth 2 (two) times daily. (Patient taking differently: Take 20 mg by mouth daily.) 60 tablet 3   gabapentin  (NEURONTIN ) 100 MG capsule Take 1 capsule (100 mg total) by mouth daily.     HYDROcodone -acetaminophen  (NORCO/VICODIN) 5-325 MG tablet Take 1  tablet by mouth every 6 (six) hours as needed for moderate pain (pain score 4-6). 45 tablet 0   insulin  glargine (LANTUS ) 100 UNIT/ML injection Inject 41 Units into the skin in the morning.     loratadine  (CLARITIN ) 10 MG tablet Take 10 mg by mouth daily.     LORazepam  (ATIVAN ) 0.5 MG tablet Take 1 tablet (0.5 mg total) by mouth every 8 (eight) hours as needed for anxiety. 0.5 mg p.o. every 8 hours as needed for anxiety/nausea. 30 tablet 0   ondansetron  (ZOFRAN ) 4 MG tablet Take 1 tablet (4 mg total) by mouth every 8 (eight) hours as needed for nausea or vomiting. 30 tablet 2   OXYGEN Inhale 1.5 L into the lungs daily as needed (Shortness  of breath).     prochlorperazine  (COMPAZINE ) 10 MG tablet Take 1 tablet (10 mg total) by mouth every 6 (six) hours as needed for nausea or vomiting. 30 tablet 1   sertraline  (ZOLOFT ) 100 MG tablet Take 100 mg by mouth in the morning.     No current facility-administered medications for this visit.   Facility-Administered Medications Ordered in Other Visits  Medication Dose Route Frequency Provider Last Rate Last Admin   0.9 %  sodium chloride  infusion   Intravenous Continuous Sherrod Sherrod, MD   Stopped at 07/24/23 1642   sodium chloride  flush (NS) 0.9 % injection 10 mL  10 mL Intracatheter PRN Sherrod Sherrod, MD   10 mL at 07/24/23 1641    VITAL SIGNS: There were no vitals taken for this visit. There were no vitals filed for this visit.  Estimated body mass index is 22.8 kg/m as calculated from the following:   Height as of 06/03/23: 5' 10 (1.778 m).   Weight as of an earlier encounter on 07/24/23: 158 lb 14.4 oz (72.1 kg).   PERFORMANCE STATUS (ECOG) : 1 - Symptomatic but completely ambulatory  Physical Exam General: NAD Cardiovascular: regular rate and rhythm Pulmonary: normal breathing pattern  Extremities: no edema, no joint deformities Skin: no rashes Neurological: AAOx3  Discussed the use of AI scribe software for clinical note transcription with the patient, who gave verbal consent to proceed.  History of Present Illness Jeffery Fernandez is an 78 year old male who was seen during his infusion for symptom management follow-up. No acute distress noted.  Accompanied by his daughter. Sleeping better during the night. He is managing constipation with Miralax , but is cautious about increasing the dose due to potential side effects. Occasional fatigue.   Recent LLE blood clot and placed on Eliquis . Daughter shares recent ED visit on 6/1 after patient experiences nose bleed with difficulty stopping. He held Eliquis  for 24 hours after incident. No recurrent events.   He has been  experiencing severe nausea, which has significantly impacted his ability to eat and drink, leading to noticeable weight loss. His weight was recorded at 158 pounds today, up from 156 pounds previously, and down from 170 pounds on May 6th. He has been taking Zofran  for nausea, currently at a lower dose of 4 mg, and has not been using Compazine  recently as it was not as effective. He takes Zofran  first thing in the morning to manage his symptoms. Patient is actively followed by Dietician and seen today. Recommended patient increase Zofran  to 2 tablets total and 8 mg every 8 hours as needed with use of Compazine  10 mg for breakthrough nausea and vomiting.  Education provided on use of both medications with hopes of improving symptoms.  Advised patient and daughter would not want to change his current regimen as both medications have not been maximized.  They verbalized understanding.  Daughter shares patient has an upcoming appointment with his primary care doctor out of concern for hallucinations and calling family members by different names.  No association with care or medications during these episodes.  Mr. Rothermel denies uncontrolled pain.  Reports current regimen which includes hydrocodone  as needed is effective.  Does not require daily dosing.  No adjustments to current regimen at this time.  All questions answered and support provided.  I discussed the importance of continued conversation with family and their medical providers regarding overall plan of care and treatment options, ensuring decisions are within the context of the patients values and GOCs.  Assessment & Plan Cancer related pain/metastatic bone disease Pain is controlled with hydrocodone .  No adjustments to regimen at this time. - Continue hydrocodone  1 tablets every 4-6 hours as needed for pain management. Not requiring daily use - Continue gabapentin  daily - Will continue to closely monitor and support.  Nausea Persistent nausea  impacting quality of life. Zofran  more effective than Compazine . - Increase Zofran  to 8 mg every 8 hours. - Consider alternating Zofran  with Compazine  if needed. -May consider use of scopolamine patch if no improvement.   Weight loss/Decreased appetite  Patient continues to have decreased appetite due to persistent nausea. Weight loss of more than 10lbs over the past month.  -Closely followed by Dietician -Focus on small frequent meals -Antiemetics as needed gaining better control of nausea.   Goals of Care Focus on symptom management and maintaining quality of life. Emphasized prompt communication for significant changes. - Emphasize not delaying contact if symptoms worsen.  I will plan to see patient back in 2-3 weeks.  Sooner if needed.  Patient expressed understanding and was in agreement with this plan. He also understands that He can call the clinic at any time with any questions, concerns, or complaints.   Any controlled substances utilized were prescribed in the context of palliative care. PDMP has been reviewed.   Visit consisted of counseling and education dealing with the complex and emotionally intense issues of symptom management and palliative care in the setting of serious and potentially life-threatening illness.  Levon Borer, AGPCNP-BC  Palliative Medicine Team/[facility]

## 2023-07-24 NOTE — Telephone Encounter (Signed)
 Received a message from [place], [place] this morning regarding the patient's recent scan results. Per Cassie, the scan shows bilateral pleural effusion, right greater than the left. Contacted the patient's daughter to check on the patient's condition--she reported that he has been complaining of difficulty catching his breath.  Informed the daughter of the scan findings and that Cassie, PA recommends the patient proceed with a thoracentesis. Explained that the procedure will involve using ultrasound and localization of the area.  Advised the daughter that an appointment has been scheduled at [facility] today at 1:00 PM. Provided instructions to proceed to Entrance A, check in at the front desk, and inform them she needs to go to Radiology. She voiced understanding.

## 2023-07-24 NOTE — Patient Instructions (Signed)
 [facility] - A DEPT OF [facility]  Discharge Instructions: Thank you for choosing [facility] to provide your oncology and hematology care.   If you have a lab appointment with the Cancer Center, please go directly to the Cancer Center and check in at the registration area.   Wear comfortable clothing and clothing appropriate for easy access to any Portacath or PICC line.   We strive to give you quality time with your provider. You may need to reschedule your appointment if you arrive late (15 or more minutes).  Arriving late affects you and other patients whose appointments are after yours.  Also, if you miss three or more appointments without notifying the office, you may be dismissed from the clinic at the provider's discretion.      For prescription refill requests, have your pharmacy contact our office and allow 72 hours for refills to be completed.    Today you received the following chemotherapy and/or immunotherapy agents: Opdivo      To help prevent nausea and vomiting after your treatment, we encourage you to take your nausea medication as directed.  BELOW ARE SYMPTOMS THAT SHOULD BE REPORTED IMMEDIATELY: *FEVER GREATER THAN 100.4 F (38 C) OR HIGHER *CHILLS OR SWEATING *NAUSEA AND VOMITING THAT IS NOT CONTROLLED WITH YOUR NAUSEA MEDICATION *UNUSUAL SHORTNESS OF BREATH *UNUSUAL BRUISING OR BLEEDING *URINARY PROBLEMS (pain or burning when urinating, or frequent urination) *BOWEL PROBLEMS (unusual diarrhea, constipation, pain near the anus) TENDERNESS IN MOUTH AND THROAT WITH OR WITHOUT PRESENCE OF ULCERS (sore throat, sores in mouth, or a toothache) UNUSUAL RASH, SWELLING OR PAIN  UNUSUAL VAGINAL DISCHARGE OR ITCHING   Items with * indicate a potential emergency and should be followed up as soon as possible or go to the Emergency Department if any problems should occur.  Please show the CHEMOTHERAPY ALERT CARD or IMMUNOTHERAPY  ALERT CARD at check-in to the Emergency Department and triage nurse.  Should you have questions after your visit or need to cancel or reschedule your appointment, please contact [facility] - A DEPT OF [facility]  Dept: [phone]  and follow the prompts.  Office hours are 8:00 a.m. to 4:30 p.m. Monday - Friday. Please note that voicemails left after 4:00 p.m. may not be returned until the following business day.  We are closed weekends and major holidays. You have access to a nurse at all times for urgent questions. Please call the main number to the clinic Dept: [phone] and follow the prompts.   For any non-urgent questions, you may also contact your provider using MyChart. We now offer e-Visits for anyone 57 and older to request care online for non-urgent symptoms. For details visit [email or web address].   Also download the MyChart app! Go to the app store, search "MyChart", open the app, select , and log in with your MyChart username and password.

## 2023-07-25 ENCOUNTER — Encounter: Payer: Self-pay | Admitting: Internal Medicine

## 2023-07-25 LAB — CYTOLOGY - NON PAP

## 2023-07-28 ENCOUNTER — Other Ambulatory Visit: Payer: Self-pay

## 2023-07-28 ENCOUNTER — Emergency Department (HOSPITAL_COMMUNITY)

## 2023-07-28 ENCOUNTER — Encounter: Payer: Self-pay | Admitting: Internal Medicine

## 2023-07-28 ENCOUNTER — Emergency Department (HOSPITAL_COMMUNITY): Admission: EM | Admit: 2023-07-28 | Discharge: 2023-07-28 | Disposition: A | Discharge status: Home / Self Care

## 2023-07-28 DIAGNOSIS — S51811A Laceration without foreign body of right forearm, initial encounter: Secondary | ICD-10-CM | POA: Diagnosis not present

## 2023-07-28 DIAGNOSIS — Z7901 Long term (current) use of anticoagulants: Secondary | ICD-10-CM | POA: Diagnosis not present

## 2023-07-28 DIAGNOSIS — W19XXXA Unspecified fall, initial encounter: Secondary | ICD-10-CM

## 2023-07-28 DIAGNOSIS — S0081XA Abrasion of other part of head, initial encounter: Secondary | ICD-10-CM | POA: Diagnosis not present

## 2023-07-28 DIAGNOSIS — Z794 Long term (current) use of insulin: Secondary | ICD-10-CM | POA: Diagnosis not present

## 2023-07-28 DIAGNOSIS — W102XXA Fall (on)(from) incline, initial encounter: Secondary | ICD-10-CM | POA: Insufficient documentation

## 2023-07-28 DIAGNOSIS — Z85118 Personal history of other malignant neoplasm of bronchus and lung: Secondary | ICD-10-CM | POA: Diagnosis not present

## 2023-07-28 DIAGNOSIS — Y92009 Unspecified place in unspecified non-institutional (private) residence as the place of occurrence of the external cause: Secondary | ICD-10-CM | POA: Diagnosis not present

## 2023-07-28 DIAGNOSIS — R109 Unspecified abdominal pain: Secondary | ICD-10-CM | POA: Diagnosis not present

## 2023-07-28 DIAGNOSIS — S4991XA Unspecified injury of right shoulder and upper arm, initial encounter: Secondary | ICD-10-CM | POA: Diagnosis present

## 2023-07-28 DIAGNOSIS — S61511A Laceration without foreign body of right wrist, initial encounter: Secondary | ICD-10-CM | POA: Diagnosis not present

## 2023-07-28 DIAGNOSIS — R079 Chest pain, unspecified: Secondary | ICD-10-CM | POA: Insufficient documentation

## 2023-07-28 DIAGNOSIS — S41111A Laceration without foreign body of right upper arm, initial encounter: Secondary | ICD-10-CM | POA: Insufficient documentation

## 2023-07-28 DIAGNOSIS — R519 Headache, unspecified: Secondary | ICD-10-CM | POA: Diagnosis not present

## 2023-07-28 LAB — CBC WITH DIFFERENTIAL/PLATELET
Abs Immature Granulocytes: 0.05 10*3/uL (ref 0.00–0.07)
Basophils Absolute: 0.1 10*3/uL (ref 0.0–0.1)
Basophils Relative: 1 %
Eosinophils Absolute: 0.1 10*3/uL (ref 0.0–0.5)
Eosinophils Relative: 2 %
HCT: 32.9 % — ABNORMAL LOW (ref 39.0–52.0)
Hemoglobin: 10.6 g/dL — ABNORMAL LOW (ref 13.0–17.0)
Immature Granulocytes: 1 %
Lymphocytes Relative: 13 %
Lymphs Abs: 1 10*3/uL (ref 0.7–4.0)
MCH: 31.2 pg (ref 26.0–34.0)
MCHC: 32.2 g/dL (ref 30.0–36.0)
MCV: 96.8 fL (ref 80.0–100.0)
Monocytes Absolute: 0.5 10*3/uL (ref 0.1–1.0)
Monocytes Relative: 7 %
Neutro Abs: 5.7 10*3/uL (ref 1.7–7.7)
Neutrophils Relative %: 76 %
Platelets: 192 10*3/uL (ref 150–400)
RBC: 3.4 MIL/uL — ABNORMAL LOW (ref 4.22–5.81)
RDW: 17.3 % — ABNORMAL HIGH (ref 11.5–15.5)
WBC: 7.5 10*3/uL (ref 4.0–10.5)
nRBC: 0 % (ref 0.0–0.2)

## 2023-07-28 LAB — I-STAT CHEM 8, ED
BUN: 27 mg/dL — ABNORMAL HIGH (ref 8–23)
Calcium, Ion: 1.14 mmol/L — ABNORMAL LOW (ref 1.15–1.40)
Chloride: 104 mmol/L (ref 98–111)
Creatinine, Ser: 2.7 mg/dL — ABNORMAL HIGH (ref 0.61–1.24)
Glucose, Bld: 84 mg/dL (ref 70–99)
HCT: 32 % — ABNORMAL LOW (ref 39.0–52.0)
Hemoglobin: 10.9 g/dL — ABNORMAL LOW (ref 13.0–17.0)
Potassium: 4.1 mmol/L (ref 3.5–5.1)
Sodium: 139 mmol/L (ref 135–145)
TCO2: 25 mmol/L (ref 22–32)

## 2023-07-28 LAB — BASIC METABOLIC PANEL WITH GFR
Anion gap: 12 (ref 5–15)
BUN: 25 mg/dL — ABNORMAL HIGH (ref 8–23)
CO2: 22 mmol/L (ref 22–32)
Calcium: 8.6 mg/dL — ABNORMAL LOW (ref 8.9–10.3)
Chloride: 102 mmol/L (ref 98–111)
Creatinine, Ser: 2.66 mg/dL — ABNORMAL HIGH (ref 0.61–1.24)
GFR, Estimated: 24 mL/min — ABNORMAL LOW (ref >60)
Glucose, Bld: 84 mg/dL (ref 70–99)
Potassium: 4.2 mmol/L (ref 3.5–5.1)
Sodium: 136 mmol/L (ref 135–145)

## 2023-07-28 LAB — TYPE AND SCREEN
ABO/RH(D): B POS
Antibody Screen: NEGATIVE

## 2023-07-28 MED ORDER — IOHEXOL 350 MG/ML SOLN
60.0000 mL | Freq: Once | INTRAVENOUS | Status: AC | PRN
  Administered 2023-07-28: 60 mL via INTRAVENOUS

## 2023-07-28 NOTE — Progress Notes (Signed)
 Orthopedic Tech Progress Note Patient Details:  Jeffery Fernandez [birth year] [id number]  Level 2 trauma   Patient ID: [name], male   DOB: December 22, 1945, 78 y.o.   MRN: [id number]  Delanna LITTIE Pac 07/28/2023, 12:51 PM

## 2023-07-28 NOTE — Discharge Instructions (Signed)
 As we discussed, all of your imaging and labs are reassuring today.  No evidence of broken bones or bleeding inside the body.  I would like for you to follow-up with your primary care doctor for further evaluation.  You may return to the emergency department for any worsening symptoms.

## 2023-07-28 NOTE — ED Triage Notes (Signed)
 Pt bib POV. Clemens going down the ramp at home. Taking eliquis . Stopped due to nose bleed last week. Restarted eliquis  on Thursday. Skin tear on R arm, bruising on R chest. R chest pain 8/10.

## 2023-07-28 NOTE — ED Notes (Signed)
CT unavailable at this time, will call back.

## 2023-07-28 NOTE — ED Provider Notes (Signed)
 [facility] AT [facility] Provider Note   CSN: [id number] Arrival date & time: 07/28/23  1153     Patient presents with: Jeffery Fernandez is a 78 y.o. male patient who presents to the emergency department today after a mechanical trip and fall down a ramp.  Patient did fall forward and hit his head and chest wall on the ramp.  Patient is anticoagulated.  He did not lose consciousness.  He denies any back pain, hip pain, neck pain.   Fall       Prior to Admission medications   Medication Sig Start Date End Date Taking? Authorizing Provider  apixaban  (ELIQUIS ) 5 MG TABS tablet Take 1 tablet (5 mg total) by mouth 2 (two) times daily. 07/24/23   Heilingoetter, Cassandra L, PA-C  atorvastatin  (LIPITOR) 40 MG tablet Take 40 mg by mouth at bedtime.    [provider]  buPROPion  (WELLBUTRIN  XL) 150 MG 24 hr tablet Take 150 mg by mouth every morning.    [provider]  doxycycline  (VIBRA -TABS) 100 MG tablet Take 1 tablet (100 mg total) by mouth 2 (two) times daily. 07/24/23   Heilingoetter, Cassandra L, PA-C  Dulaglutide  (TRULICITY ) 1.5 MG/0.5ML SOAJ Inject 1.5 mg into the skin daily. Takes on Thursdays 10/01/22   [provider]  famotidine  (PEPCID ) 20 MG tablet Take 1 tablet (20 mg total) by mouth 2 (two) times daily. Patient taking differently: Take 20 mg by mouth daily. 03/04/23   Pickenpack-Cousar, Athena N, NP  gabapentin  (NEURONTIN ) 100 MG capsule Take 1 capsule (100 mg total) by mouth daily. 01/02/23   Koomson, Julius, MD  HYDROcodone -acetaminophen  (NORCO/VICODIN) 5-325 MG tablet Take 1 tablet by mouth every 6 (six) hours as needed for moderate pain (pain score 4-6). 07/16/23   Pickenpack-Cousar, Athena N, NP  insulin  glargine (LANTUS ) 100 UNIT/ML injection Inject 41 Units into the skin in the morning.    [provider]  loratadine  (CLARITIN ) 10 MG tablet Take 10 mg by mouth daily.    [provider]  LORazepam   (ATIVAN ) 0.5 MG tablet Take 1 tablet (0.5 mg total) by mouth every 8 (eight) hours as needed for anxiety. 0.5 mg p.o. every 8 hours as needed for anxiety/nausea. 07/16/23   Pickenpack-Cousar, Athena N, NP  ondansetron  (ZOFRAN ) 4 MG tablet Take 1 tablet (4 mg total) by mouth every 8 (eight) hours as needed for nausea or vomiting. 07/16/23   Pickenpack-Cousar, Fannie SAILOR, NP  OXYGEN Inhale 1.5 L into the lungs daily as needed (Shortness of breath).    [provider]  prochlorperazine  (COMPAZINE ) 10 MG tablet Take 1 tablet (10 mg total) by mouth every 6 (six) hours as needed for nausea or vomiting. 02/26/23   Pickenpack-Cousar, Fannie SAILOR, NP  sertraline  (ZOLOFT ) 100 MG tablet Take 100 mg by mouth in the morning.    [provider]    Allergies: Amlodipine , Penicillins, and Hydralazine     Review of Systems  All other systems reviewed and are negative.   Updated Vital Signs BP (!) 148/92   Pulse 95   Temp 97.8 F (36.6 C) (Oral)   Resp 18   Ht 5' 10 (1.778 m)   Wt 71.7 kg   SpO2 100%   BMI 22.67 kg/m   Physical Exam Vitals and nursing note reviewed.  Constitutional:      General: He is not in acute distress.    Appearance: Normal appearance.  HENT:     Head: Normocephalic.  Eyes:     General:        Right eye: No discharge.        Left eye: No discharge.   Neck:     Comments: No midline or paracervical tenderness. Cardiovascular:     Comments: Regular rate and rhythm.  S1/S2 are distinct without any evidence of murmur, rubs, or gallops.  Radial pulses are 2+ bilaterally.  Dorsalis pedis pulses are 2+ bilaterally.  No evidence of pedal edema. Pulmonary:     Comments: Clear to auscultation bilaterally.  Normal effort.  No respiratory distress.  No evidence of wheezes, rales, or rhonchi heard throughout. Chest:     Comments: Questionable right anterior chest wall contusion with tenderness to palpation.  Chest is overall stable with compression. Abdominal:      General: Abdomen is flat. Bowel sounds are normal. There is no distension.     Tenderness: There is no abdominal tenderness. There is no guarding or rebound.   Musculoskeletal:        General: Normal range of motion.     Cervical back: Neck supple.     Comments: Pelvis is stable.  No midline thoracic or lumbar tenderness on exam.  No evidence of ecchymosis.   Skin:    General: Skin is warm and dry.     Findings: No rash.     Comments: Large skin tears to the right antecubital fossa extension of the forearm and upper bicep.  There is also a skin tear to the right wrist.   Neurological:     General: No focal deficit present.     Mental Status: He is alert.   Psychiatric:        Mood and Affect: Mood normal.        Behavior: Behavior normal.     (all labs ordered are listed, but only abnormal results are displayed) Labs Reviewed  CBC WITH DIFFERENTIAL/PLATELET - Abnormal; Notable for the following components:      Result Value   RBC 3.40 (*)    Hemoglobin 10.6 (*)    HCT 32.9 (*)    RDW 17.3 (*)    All other components within normal limits  BASIC METABOLIC PANEL WITH GFR - Abnormal; Notable for the following components:   BUN 25 (*)    Creatinine, Ser 2.66 (*)    Calcium  8.6 (*)    GFR, Estimated 24 (*)    All other components within normal limits  I-STAT CHEM 8, ED - Abnormal; Notable for the following components:   BUN 27 (*)    Creatinine, Ser 2.70 (*)    Calcium , Ion 1.14 (*)    Hemoglobin 10.9 (*)    HCT 32.0 (*)    All other components within normal limits  TYPE AND SCREEN    EKG: None  Radiology: CT HEAD WO CONTRAST Result Date: 07/28/2023 CLINICAL DATA:  Head trauma, fall, on anticoagulation. EXAM: CT HEAD WITHOUT CONTRAST TECHNIQUE: Contiguous axial images were obtained from the base of the skull through the vertex without intravenous contrast. RADIATION DOSE REDUCTION: This exam was performed according to the departmental dose-optimization program which  includes automated exposure control, adjustment of the mA and/or kV according to patient size and/or use of iterative reconstruction technique. COMPARISON:  CT head 12/31/2022. FINDINGS: Brain: No acute intracranial hemorrhage. No CT evidence of acute infarct. Nonspecific hypoattenuation in the periventricular and subcortical white matter favored to reflect chronic microvascular ischemic changes. Similar mild parenchymal volume loss. No edema, mass effect, or  midline shift. The basilar cisterns are patent. Ventricles: The ventricles are normal. Vascular: Atherosclerotic calcifications of the carotid siphons. No hyperdense vessel. Skull: No acute or aggressive finding. Orbits: Bilateral lens replacement. Sinuses: The visualized paranasal sinuses are clear. Other: Mastoid air cells are clear. IMPRESSION: No CT evidence of acute intracranial abnormality. Extensive chronic microvascular ischemic changes and mild parenchymal volume loss. Electronically Signed   By: Donnice Mania M.D.   On: 07/28/2023 13:51   CT CERVICAL SPINE WO CONTRAST Result Date: 07/28/2023 CLINICAL DATA:  Poly trauma, blunt. Status post fall. On blood thinners. EXAM: CT CERVICAL SPINE WITHOUT CONTRAST TECHNIQUE: Multidetector CT imaging of the cervical spine was performed without intravenous contrast. Multiplanar CT image reconstructions were also generated. RADIATION DOSE REDUCTION: This exam was performed according to the departmental dose-optimization program which includes automated exposure control, adjustment of the mA and/or kV according to patient size and/or use of iterative reconstruction technique. COMPARISON:  Cervical spine radiographs 05/09/2017. FINDINGS: Alignment: Straightening of the usual cervical lordosis with a mild anterolisthesis at C2-3 and C7-T1: Similar to prior radiographs. Skull base and vertebrae: No evidence of acute fracture or traumatic subluxation. Status post C3-6 ACDF with an anterior plate, screws and interbody  bone plugs. 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 Soft tissues and spinal canal: No prevertebral fluid or swelling. No visible canal hematoma. Disc levels: Asymmetric uncinate spurring and facet hypertrophy on the left at C2-3 contribute to moderate left foraminal narrowing. There is some residual foraminal narrowing at the fused levels, greatest on the right at C5-6. There are adjacent segment changes at C6-7 with loss of disc height, posterior osteophytes and bilateral uncinate spurring contributing to mild spinal stenosis and moderate foraminal narrowing bilaterally. There is advanced facet hypertrophy at C7-T1 accounting for the anterolisthesis and contributing to moderate foraminal narrowing bilaterally. Upper chest: Chest findings dictated separately. Other: Ossification of the ligamentum nuchae. Bilateral carotid atherosclerosis. Left-sided central line in place. IMPRESSION: 1. No evidence of acute cervical spine fracture, traumatic subluxation or static signs of instability. 2. 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 3. Adjacent segment changes at C6-7 with mild spinal stenosis and moderate foraminal narrowing bilaterally. 4. Advanced facet hypertrophy at C7-T1 contributing to moderate foraminal narrowing bilaterally. Electronically Signed   By: Elsie Perone M.D.   On: 07/28/2023 13:41   CT CHEST ABDOMEN PELVIS W CONTRAST Result Date: 07/28/2023 CLINICAL DATA:  Trauma, metastatic lung cancer * Tracking Code: BO * EXAM: CT CHEST, ABDOMEN, AND PELVIS WITH CONTRAST TECHNIQUE: Multidetector CT imaging of the chest, abdomen and pelvis was performed following the standard protocol during bolus administration of intravenous contrast. RADIATION DOSE REDUCTION: This exam was performed according to the departmental dose-optimization program which includes automated exposure control, adjustment of the mA and/or kV according to patient size and/or use of iterative reconstruction technique. CONTRAST:   60mL OMNIPAQUE  IOHEXOL  350 MG/ML SOLN COMPARISON:  CT chest abdomen pelvis, 07/18/2023 FINDINGS: CT CHEST FINDINGS Cardiovascular: Left chest port catheter. Aortic atherosclerosis. Cardiomegaly. Three-vessel coronary artery calcifications. Small pericardial effusion. No pericardial effusion. Mediastinum/Nodes: Unchanged prominent mediastinal lymph nodes. Thyroid  gland, trachea, and esophagus demonstrate no significant findings. Lungs/Pleura: Diminished volume of moderate right and small left pleural effusions. Unchanged treated mass in the right upper lobe (series 5, image 46). Musculoskeletal: No chest wall abnormality. No acute osseous findings. CT ABDOMEN PELVIS FINDINGS Hepatobiliary: No solid liver abnormality is seen. No gallstones, gallbladder wall thickening, or biliary dilatation. Pancreas: Unremarkable. No pancreatic ductal dilatation or surrounding inflammatory changes. Spleen: Normal  in size without significant abnormality. Adrenals/Urinary Tract: Adrenal glands are unremarkable. Nonobstructive calculus of the inferior pole of the right kidney. No left-sided calculi, ureteral calculi, or hydronephrosis. Bladder is unremarkable. Stomach/Bowel: Stomach is within normal limits. Appendix appears normal. No evidence of bowel wall thickening, distention, or inflammatory changes. Pancolonic diverticulosis. Vascular/Lymphatic: No significant vascular findings are present. No enlarged abdominal or pelvic lymph nodes. Reproductive: No mass or other abnormality. Other: No abdominal wall hernia or abnormality. No ascites. Musculoskeletal: No acute osseous findings. Intramedullary nail fixation of the right femur. Unchanged chronic wedge deformities of L3 and L4 with vertebral cement augmentation of L3 through L5 and posterior fusion of L4-L5. Unchanged sclerotic osseous metastasis of T10. IMPRESSION: 1. No CT evidence of acute traumatic injury to the chest, abdomen, or pelvis. 2. Diminished volume of moderate right  and small left pleural effusions. 3. Unchanged treated mass in the right upper lobe. 4. Unchanged prominent mediastinal lymph nodes. 5. Nonobstructive right nephrolithiasis. 6. Pancolonic diverticulosis without evidence of acute diverticulitis. 7. Cardiomegaly and coronary artery disease. Aortic Atherosclerosis (ICD10-I70.0). Electronically Signed   By: Marolyn JONETTA Jaksch M.D.   On: 07/28/2023 13:36   DG Pelvis Portable Result Date: 07/28/2023 CLINICAL DATA:  Fall. EXAM: PORTABLE PELVIS 1-2 VIEWS COMPARISON:  None Available. FINDINGS: No acute fracture or dislocation. The bones are osteopenic. Status post prior ORIF of the right femur. Lower lumbar fusion hardware. The soft tissues are unremarkable. IMPRESSION: 1. No acute fracture or dislocation. 2. Osteopenia. Electronically Signed   By: Vanetta Chou M.D.   On: 07/28/2023 13:17   DG Chest Portable 1 View Result Date: 07/28/2023 CLINICAL DATA:  Pain after fall history of non-small-cell lung cancer EXAM: PORTABLE CHEST 1 VIEW COMPARISON:  X-ray 07/24/2023. FINDINGS: Increased small right pleural effusion. There is some increasing right mid lower lung opacity. The left inferior costophrenic angle is clipped off the edge of the film. No pneumothorax. Enlarged cardiopericardial silhouette with some prominence of the central vasculature. Left IJ chest port in place with tip overlying the SVC right atrial junction. Overlapping cardiac leads. Fixation hardware along the cervical spine. Osteopenia. IMPRESSION: Slight increase in right-sided pleural effusion and lung opacity. Chest port. No pneumothorax. Left inferior costophrenic angle is clipped off the edge of the film. Electronically Signed   By: Ranell Bring M.D.   On: 07/28/2023 12:50     Ultrasound ED FAST  Date/Time: 07/28/2023 1:27 PM  Performed by: Theotis Cameron HERO, PA-C Authorized by: Theotis Cameron HERO, PA-C  Procedure details:    Indications: blunt chest trauma       Assess for:  Pneumothorax,  pericardial effusion and intra-abdominal fluid    Technique:  Abdominal, cardiac and chest    Images: archived      Abdominal findings:    L kidney:  Visualized   R kidney:  Visualized   Liver:  Visualized    Bladder:  Visualized   Hepatorenal space visualized: identified     Splenorenal space: identified     Rectovesical free fluid: not identified     Splenorenal free fluid: not identified     Hepatorenal space free fluid: not identified   Cardiac findings:    Heart:  Visualized   Wall motion: identified     Pericardial effusion: not identified   Chest findings:    L lung sliding: identified     R lung sliding: identified     Fluid in thorax: not identified      Medications Ordered in the ED  iohexol  (OMNIPAQUE ) 350 MG/ML injection 60 mL (60 mLs Intravenous Contrast Given 07/28/23 1254)    Clinical Course as of 07/28/23 1532  Mon Jul 28, 2023  1208 Evaluated on arrival.  Fall on thinners.  Abrasion to his head and bruising to his chest wall.  Does not appear to be in distress.  Plan for CTs and x-rays [TY]    Clinical Course User Index [TY] Neysa Caron PARAS, DO    Medical Decision Making Jeffery Fernandez is a 78 y.o. male patient who presents to the emergency department today for further evaluation of head injury and fall on thinners.  Patient does have an abrasion to the right frontal scalp.  Will go ahead and scan his head and neck for any obvious send intracranial hemorrhage or cervical spinal pathology.  Patient does have some mild bruising questionably whether or not this is new over the right anterior chest wall with some tenderness.  Will get a CT chest abdomen pelvis with contrast to further assess.  Will also get imaging of his pelvis.  Workup is ultimately reassuring.  No signs of intracranial hemorrhage, intrathoracic or intra-abdominal bleeding or pathology.  Patient was ambulatory here in the department without difficulty.  I will have him follow-up with his primary  care doctor for further evaluation.  Strict turn precautions were discussed.  He is safe for discharge at this time.  Amount and/or Complexity of Data Reviewed Labs: ordered. Radiology: ordered.  Risk Prescription drug management.     Final diagnoses:  Fall, initial encounter    ED Discharge Orders     None          Theotis Cameron HERO, [place] 07/28/23 1532    Mannie Pac T, DO 08/02/23 [phone]

## 2023-07-28 NOTE — ED Notes (Signed)
RN transported pt to CT 

## 2023-07-28 NOTE — ED Notes (Signed)
 X-ray at bedside

## 2023-07-28 NOTE — ED Notes (Signed)
 Skin tears cleaned with normal saline, ointment applied, gauze applied and paper tape used to secure bandages. Tolerated well.

## 2023-08-04 ENCOUNTER — Telehealth: Payer: Self-pay | Admitting: Medical Oncology

## 2023-08-04 ENCOUNTER — Encounter: Payer: Self-pay | Admitting: Internal Medicine

## 2023-08-04 NOTE — Telephone Encounter (Signed)
 Verneita sent Mychart message  and said [employer] needs order to continue oxygen.  I called [employer] and requested a call return.

## 2023-08-04 NOTE — Telephone Encounter (Signed)
 LVM again for [employer] to call me with ordering provider and date ordered for Mr.Borgwardt oxygen.

## 2023-08-06 ENCOUNTER — Ambulatory Visit (INDEPENDENT_AMBULATORY_CARE_PROVIDER_SITE_OTHER): Admitting: Physician Assistant

## 2023-08-09 ENCOUNTER — Other Ambulatory Visit: Payer: Self-pay | Admitting: Nurse Practitioner

## 2023-08-09 DIAGNOSIS — R110 Nausea: Secondary | ICD-10-CM

## 2023-08-09 DIAGNOSIS — C3411 Malignant neoplasm of upper lobe, right bronchus or lung: Secondary | ICD-10-CM

## 2023-08-09 DIAGNOSIS — Z515 Encounter for palliative care: Secondary | ICD-10-CM

## 2023-08-09 MED ORDER — LORAZEPAM 0.5 MG PO TABS: 0.5000 mg | ORAL_TABLET | Freq: Three times a day (TID) | ORAL | 0 refills | Status: DC | PRN

## 2023-08-09 MED ORDER — ONDANSETRON HCL 4 MG PO TABS: 4.0000 mg | ORAL_TABLET | Freq: Three times a day (TID) | ORAL | 2 refills | Status: DC | PRN

## 2023-08-11 NOTE — Progress Notes (Unsigned)
 [facility] OFFICE PROGRESS NOTE  Pura Lenis, MD [address] [place] [address]  DIAGNOSIS: Stage IV (T2b, N3, M1b) non-small cell lung cancer, adenocarcinoma . This was diagnosed in December 2024 and presented with large right apical lung mass in addition to right hilar, mediastinal and supraclavicular lymphadenopathy as well as pulmonary nodules in the right upper lobe and right lower lobe with T10 bone metastasis in addition to medial left inguinal lymph node versus subcutaneous nodule.    Biomarker Findings HRD signature - HRDsig Negative Microsatellite status - Cannot Be Determined ? Tumor Mutational Burden - 8 Muts/Mb Genomic Findings For a complete list of the genes assayed, please refer to the Appendix. CDKN2A loss KEAP1 K97fs*60 STK11 E291* CDKN2B loss IKZF1 R111* TP53 R280K 8 Disease relevant genes with no reportable alterations: ALK, BRAF, EGFR, ERBB2, KRAS, MET, RET, ROS1    PRIOR THERAPY: 1) Palliative radiotherapy to the T10 bone metastasis  2) Radiation to right femur under the care of Dr. Shannon completed on 06/10/23  CURRENT THERAPY: First-line treatment with chemoimmunotherapy in the form of carboplatin  for AUC of 5 and paclitaxel  175 Mg/M2 on days 1 and 22 in addition to ipilimumab  1 Mg/KG on day 1 and nivolumab  360 Mg IV on days 1 and 22 for 1 cycle followed by maintenance treatment with ipilimumab  1 Mg/KG every 6 weeks and nivolumab  360 Mg IV every 3 weeks. First dose February 18, 2023. Chemotherapy was discontinued starting from day 22 of cycle #1 secondary to intolerance.   INTERVAL HISTORY: Jeffery Fernandez 78 y.o. male returns to the clinic today for a follow up visit accompanied by his daughter. The patient was last seen by myself and Dr. Sherrod on 07/24/23.   In the interval since last being seen, he was seen in the ER for a mechanical fall.   At his last appointment, we had discussed reducing the dose of his blood  thinner for his DVT in the setting of significant senile pupura and epistaxis. His bleeding is ***.   He experiences nausea and vomiting almost daily, which began after starting treatment for his current condition. He takes Zofran  regularly for nausea and Pepcid  for reflux, which has been ongoing since before treatment. Nausea and vomiting are worse after lying down and then getting up, and he experiences dry heaves when rolling onto his left side.   Right hip pain laying on that side.   He often feels chilly but denies having fevers, chills, or night sweats. No chest discomfort beyond the post-procedure pain and no other rashes besides the hip sore.  Had a restaging CT scan recently that showed bilateral pleural effusions the right being larger than the left. He had a thoracentesis prior to his last appointment which obtained 1.6 L of fluid. His breathing is *** at this time. He sometimes has an intermittent cough.   He is established care with palliative care and saw them on 07/16/23. He is scheduled to see them on ***.   He also is having left shoulder pain.  The patient is here today for evaluation prior to starting cycle #5  MEDICAL HISTORY: Past Medical History:  Diagnosis Date   Acid reflux    Arrhythmia    Arthritis    Cataract    bilateral cataract extraction with intraoccular lens implants   Chest pain, unspecified    CHF (congestive heart failure) (HCC)    Chronic airway obstruction, not elsewhere classified    PT. DENIES  HE HAS COPD   CKD (chronic kidney disease)    Colon polyps    Adenomatous Polyps 2007   Depression    Diabetes mellitus    Diverticulosis    Gout    Heart murmur    History of kidney stones    History of radiation therapy    Thoracic Spine- 01/27/23-02/10/23- Dr. Lynwood Nasuti   Hyperlipemia    Hypertension    Lung cancer (HCC) 12/2022   Nephrolithiasis    Normocytic anemia 04/23/2017   Obstructive sleep apnea (adult) (pediatric)    Other and  unspecified hyperlipidemia    Other left bundle branch block    Retinal vascular occlusion, unspecified    right   Shortness of breath    on exertion   Unspecified sleep apnea    Vision loss of right eye    White coat syndrome with diagnosis of hypertension 09/19/2017    ALLERGIES:  is allergic to amlodipine , penicillins, and hydralazine .  MEDICATIONS:  Current Outpatient Medications  Medication Sig Dispense Refill   apixaban  (ELIQUIS ) 5 MG TABS tablet Take 1 tablet (5 mg total) by mouth 2 (two) times daily. 60 tablet 2   atorvastatin  (LIPITOR) 40 MG tablet Take 40 mg by mouth at bedtime.     buPROPion  (WELLBUTRIN  XL) 150 MG 24 hr tablet Take 150 mg by mouth every morning.     doxycycline  (VIBRA -TABS) 100 MG tablet Take 1 tablet (100 mg total) by mouth 2 (two) times daily. 20 tablet 0   Dulaglutide  (TRULICITY ) 1.5 MG/0.5ML SOAJ Inject 1.5 mg into the skin daily. Takes on Thursdays     famotidine  (PEPCID ) 20 MG tablet Take 1 tablet (20 mg total) by mouth 2 (two) times daily. (Patient taking differently: Take 20 mg by mouth daily.) 60 tablet 3   gabapentin  (NEURONTIN ) 100 MG capsule Take 1 capsule (100 mg total) by mouth daily.     HYDROcodone -acetaminophen  (NORCO/VICODIN) 5-325 MG tablet Take 1 tablet by mouth every 6 (six) hours as needed for moderate pain (pain score 4-6). 45 tablet 0   insulin  glargine (LANTUS ) 100 UNIT/ML injection Inject 41 Units into the skin in the morning.     loratadine  (CLARITIN ) 10 MG tablet Take 10 mg by mouth daily.     LORazepam  (ATIVAN ) 0.5 MG tablet Take 1 tablet (0.5 mg total) by mouth every 8 (eight) hours as needed for anxiety. 0.5 mg p.o. every 8 hours as needed for anxiety/nausea. 30 tablet 0   ondansetron  (ZOFRAN ) 4 MG tablet Take 1 tablet (4 mg total) by mouth every 8 (eight) hours as needed for nausea or vomiting. 30 tablet 2   OXYGEN Inhale 1.5 L into the lungs daily as needed (Shortness of breath).     prochlorperazine  (COMPAZINE ) 10 MG tablet  Take 1 tablet (10 mg total) by mouth every 6 (six) hours as needed for nausea or vomiting. 30 tablet 1   sertraline  (ZOLOFT ) 100 MG tablet Take 100 mg by mouth in the morning.     No current facility-administered medications for this visit.    SURGICAL HISTORY:  Past Surgical History:  Procedure Laterality Date   ANTERIOR CERVICAL DECOMP/DISCECTOMY FUSION N/A 06/26/2016   Procedure: Re-Exploration of Cervical Wound;  Surgeon: Onetha Kuba, MD;  Location: [facility] OR;  Service: Neurosurgery;  Laterality: N/A;   ANTERIOR CERVICAL DECOMP/DISCECTOMY FUSION N/A 06/26/2016   Procedure: ANTERIOR CERVICAL DECOMPRESSION/DISCECTOMY FUSION, INTERBODY PROSTESIS, PLATE, CERVICAL THREE CERVICAL FOUR, CERVICAL FOUR CERVICAL FIVE CERVICAL SIX;  Surgeon: Mavis Purchase, MD;  Location: [facility] OR;  Service: Neurosurgery;  Laterality: N/A;   APPLICATION OF WOUND VAC  06/05/2020   Procedure: APPLICATION OF WOUND VAC;  Surgeon: Mavis Purchase, MD;  Location: [facility] OR;  Service: Neurosurgery;;   BACK SURGERY     CATARACT EXTRACTION Bilateral    FEMUR IM NAIL Right 05/06/2023   Procedure: RIGHT FEMUR NAILING;  Surgeon: Genelle Standing, MD;  Location: [facility] OR;  Service: Orthopedics;  Laterality: Right;  RIGHT INTERTROCHANTERIC INTRAMEDULLARY HIP NAIL   IR IMAGING GUIDED PORT INSERTION  02/19/2023   IR THORACENTESIS ASP PLEURAL SPACE W/IMG GUIDE  07/24/2023   KIDNEY STONE SURGERY     KYPHOPLASTY N/A 01/19/2018   Procedure: KYPHOPLASTY LUMBAR THREE;  Surgeon: Mavis Purchase, MD;  Location: [facility] OR;  Service: Neurosurgery;  Laterality: N/A;  KYPHOPLASTY LUMBAR THREE   LUMBAR WOUND DEBRIDEMENT N/A 06/05/2020   Procedure: LUMBAR WOUND DEBRIDEMENT;  Surgeon: Mavis Purchase, MD;  Location: [facility] OR;  Service: Neurosurgery;  Laterality: N/A;    REVIEW OF SYSTEMS:   Review of Systems  Constitutional: Negative for appetite change, chills, fatigue, fever and unexpected weight change.  HENT:   Negative for mouth sores, nosebleeds, sore throat  and trouble swallowing.   Eyes: Negative for eye problems and icterus.  Respiratory: Negative for cough, hemoptysis, shortness of breath and wheezing.   Cardiovascular: Negative for chest pain and leg swelling.  Gastrointestinal: Negative for abdominal pain, constipation, diarrhea, nausea and vomiting.  Genitourinary: Negative for bladder incontinence, difficulty urinating, dysuria, frequency and hematuria.   Musculoskeletal: Negative for back pain, gait problem, neck pain and neck stiffness.  Skin: Negative for itching and rash.  Neurological: Negative for dizziness, extremity weakness, gait problem, headaches, light-headedness and seizures.  Hematological: Negative for adenopathy. Does not bruise/bleed easily.  Psychiatric/Behavioral: Negative for confusion, depression and sleep disturbance. The patient is not nervous/anxious.     PHYSICAL EXAMINATION:  There were no vitals taken for this visit.  ECOG PERFORMANCE STATUS: {CHL ONC ECOG [id number]  Physical Exam  Constitutional: Oriented to person, place, and time and well-developed, well-nourished, and in no distress. No distress.  HENT:  Head: Normocephalic and atraumatic.  Mouth/Throat: Oropharynx is clear and moist. No oropharyngeal exudate.  Eyes: Conjunctivae are normal. Right eye exhibits no discharge. Left eye exhibits no discharge. No scleral icterus.  Neck: Normal range of motion. Neck supple.  Cardiovascular: Normal rate, regular rhythm, normal heart sounds and intact distal pulses.   Pulmonary/Chest: Effort normal and breath sounds normal. No respiratory distress. No wheezes. No rales.  Abdominal: Soft. Bowel sounds are normal. Exhibits no distension and no mass. There is no tenderness.  Musculoskeletal: Normal range of motion. Exhibits no edema.  Lymphadenopathy:    No cervical adenopathy.  Neurological: Alert and oriented to person, place, and time. Exhibits normal muscle tone. Gait normal. Coordination normal.   Skin: Skin is warm and dry. No rash noted. Not diaphoretic. No erythema. No pallor.  Psychiatric: Mood, memory and judgment normal.  Vitals reviewed.  LABORATORY DATA: Lab Results  Component Value Date   WBC 7.5 07/28/2023   HGB 10.9 (L) 07/28/2023   HCT 32.0 (L) 07/28/2023   MCV 96.8 07/28/2023   PLT 192 07/28/2023      Chemistry      Component Value Date/Time   NA 139 07/28/2023 1225   NA 142 07/03/2022 1356   K 4.1 07/28/2023 1225   CL 104 07/28/2023 1225   CO2 22 07/28/2023 1211   BUN 27 (H) 07/28/2023 1225   BUN  22 07/03/2022 1356   CREATININE 2.70 (H) 07/28/2023 1225   CREATININE 2.61 (H) 07/24/2023 1425      Component Value Date/Time   CALCIUM  8.6 (L) 07/28/2023 1211   ALKPHOS 107 07/24/2023 1425   AST 16 07/24/2023 1425   ALT 10 07/24/2023 1425   BILITOT 0.5 07/24/2023 1425       RADIOGRAPHIC STUDIES:  CT HEAD WO CONTRAST Result Date: 07/28/2023 CLINICAL DATA:  Head trauma, fall, on anticoagulation. EXAM: CT HEAD WITHOUT CONTRAST TECHNIQUE: Contiguous axial images were obtained from the base of the skull through the vertex without intravenous contrast. RADIATION DOSE REDUCTION: This exam was performed according to the departmental dose-optimization program which includes automated exposure control, adjustment of the mA and/or kV according to patient size and/or use of iterative reconstruction technique. COMPARISON:  CT head 12/31/2022. FINDINGS: Brain: No acute intracranial hemorrhage. No CT evidence of acute infarct. Nonspecific hypoattenuation in the periventricular and subcortical white matter favored to reflect chronic microvascular ischemic changes. Similar mild parenchymal volume loss. No edema, mass effect, or midline shift. The basilar cisterns are patent. Ventricles: The ventricles are normal. Vascular: Atherosclerotic calcifications of the carotid siphons. No hyperdense vessel. Skull: No acute or aggressive finding. Orbits: Bilateral lens replacement.  Sinuses: The visualized paranasal sinuses are clear. Other: Mastoid air cells are clear. IMPRESSION: No CT evidence of acute intracranial abnormality. Extensive chronic microvascular ischemic changes and mild parenchymal volume loss. Electronically Signed   By: Donnice Mania M.D.   On: 07/28/2023 13:51   CT CERVICAL SPINE WO CONTRAST Result Date: 07/28/2023 CLINICAL DATA:  Poly trauma, blunt. Status post fall. On blood thinners. EXAM: CT CERVICAL SPINE WITHOUT CONTRAST TECHNIQUE: Multidetector CT imaging of the cervical spine was performed without intravenous contrast. Multiplanar CT image reconstructions were also generated. RADIATION DOSE REDUCTION: This exam was performed according to the departmental dose-optimization program which includes automated exposure control, adjustment of the mA and/or kV according to patient size and/or use of iterative reconstruction technique. COMPARISON:  Cervical spine radiographs 05/09/2017. FINDINGS: Alignment: Straightening of the usual cervical lordosis with a mild anterolisthesis at C2-3 and C7-T1: Similar to prior radiographs. Skull base and vertebrae: No evidence of acute fracture or traumatic subluxation. Status post C3-6 ACDF with an anterior plate, screws and interbody bone plugs. 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 Soft tissues and spinal canal: No prevertebral fluid or swelling. No visible canal hematoma. Disc levels: Asymmetric uncinate spurring and facet hypertrophy on the left at C2-3 contribute to moderate left foraminal narrowing. There is some residual foraminal narrowing at the fused levels, greatest on the right at C5-6. There are adjacent segment changes at C6-7 with loss of disc height, posterior osteophytes and bilateral uncinate spurring contributing to mild spinal stenosis and moderate foraminal narrowing bilaterally. There is advanced facet hypertrophy at C7-T1 accounting for the anterolisthesis and contributing to moderate foraminal narrowing  bilaterally. Upper chest: Chest findings dictated separately. Other: Ossification of the ligamentum nuchae. Bilateral carotid atherosclerosis. Left-sided central line in place. IMPRESSION: 1. No evidence of acute cervical spine fracture, traumatic subluxation or static signs of instability. 2. 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 3. Adjacent segment changes at C6-7 with mild spinal stenosis and moderate foraminal narrowing bilaterally. 4. Advanced facet hypertrophy at C7-T1 contributing to moderate foraminal narrowing bilaterally. Electronically Signed   By: Elsie Perone M.D.   On: 07/28/2023 13:41   CT CHEST ABDOMEN PELVIS W CONTRAST Result Date: 07/28/2023 CLINICAL DATA:  Trauma, metastatic lung cancer * Tracking Code: BO *  EXAM: CT CHEST, ABDOMEN, AND PELVIS WITH CONTRAST TECHNIQUE: Multidetector CT imaging of the chest, abdomen and pelvis was performed following the standard protocol during bolus administration of intravenous contrast. RADIATION DOSE REDUCTION: This exam was performed according to the departmental dose-optimization program which includes automated exposure control, adjustment of the mA and/or kV according to patient size and/or use of iterative reconstruction technique. CONTRAST:  60mL OMNIPAQUE  IOHEXOL  350 MG/ML SOLN COMPARISON:  CT chest abdomen pelvis, 07/18/2023 FINDINGS: CT CHEST FINDINGS Cardiovascular: Left chest port catheter. Aortic atherosclerosis. Cardiomegaly. Three-vessel coronary artery calcifications. Small pericardial effusion. No pericardial effusion. Mediastinum/Nodes: Unchanged prominent mediastinal lymph nodes. Thyroid  gland, trachea, and esophagus demonstrate no significant findings. Lungs/Pleura: Diminished volume of moderate right and small left pleural effusions. Unchanged treated mass in the right upper lobe (series 5, image 46). Musculoskeletal: No chest wall abnormality. No acute osseous findings. CT ABDOMEN PELVIS FINDINGS Hepatobiliary: No  solid liver abnormality is seen. No gallstones, gallbladder wall thickening, or biliary dilatation. Pancreas: Unremarkable. No pancreatic ductal dilatation or surrounding inflammatory changes. Spleen: Normal in size without significant abnormality. Adrenals/Urinary Tract: Adrenal glands are unremarkable. Nonobstructive calculus of the inferior pole of the right kidney. No left-sided calculi, ureteral calculi, or hydronephrosis. Bladder is unremarkable. Stomach/Bowel: Stomach is within normal limits. Appendix appears normal. No evidence of bowel wall thickening, distention, or inflammatory changes. Pancolonic diverticulosis. Vascular/Lymphatic: No significant vascular findings are present. No enlarged abdominal or pelvic lymph nodes. Reproductive: No mass or other abnormality. Other: No abdominal wall hernia or abnormality. No ascites. Musculoskeletal: No acute osseous findings. Intramedullary nail fixation of the right femur. Unchanged chronic wedge deformities of L3 and L4 with vertebral cement augmentation of L3 through L5 and posterior fusion of L4-L5. Unchanged sclerotic osseous metastasis of T10. IMPRESSION: 1. No CT evidence of acute traumatic injury to the chest, abdomen, or pelvis. 2. Diminished volume of moderate right and small left pleural effusions. 3. Unchanged treated mass in the right upper lobe. 4. Unchanged prominent mediastinal lymph nodes. 5. Nonobstructive right nephrolithiasis. 6. Pancolonic diverticulosis without evidence of acute diverticulitis. 7. Cardiomegaly and coronary artery disease. Aortic Atherosclerosis (ICD10-I70.0). Electronically Signed   By: Marolyn JONETTA Jaksch M.D.   On: 07/28/2023 13:36   DG Pelvis Portable Result Date: 07/28/2023 CLINICAL DATA:  Fall. EXAM: PORTABLE PELVIS 1-2 VIEWS COMPARISON:  None Available. FINDINGS: No acute fracture or dislocation. The bones are osteopenic. Status post prior ORIF of the right femur. Lower lumbar fusion hardware. The soft tissues are  unremarkable. IMPRESSION: 1. No acute fracture or dislocation. 2. Osteopenia. Electronically Signed   By: Vanetta Chou M.D.   On: 07/28/2023 13:17   DG Chest Portable 1 View Result Date: 07/28/2023 CLINICAL DATA:  Pain after fall history of non-small-cell lung cancer EXAM: PORTABLE CHEST 1 VIEW COMPARISON:  X-ray 07/24/2023. FINDINGS: Increased small right pleural effusion. There is some increasing right mid lower lung opacity. The left inferior costophrenic angle is clipped off the edge of the film. No pneumothorax. Enlarged cardiopericardial silhouette with some prominence of the central vasculature. Left IJ chest port in place with tip overlying the SVC right atrial junction. Overlapping cardiac leads. Fixation hardware along the cervical spine. Osteopenia. IMPRESSION: Slight increase in right-sided pleural effusion and lung opacity. Chest port. No pneumothorax. Left inferior costophrenic angle is clipped off the edge of the film. Electronically Signed   By: Ranell Bring M.D.   On: 07/28/2023 12:50   IR THORACENTESIS ASP PLEURAL SPACE W/IMG GUIDE Result Date: 07/24/2023 INDICATION: 78 year old male with right pleural  effusion for diagnostic and therapeutic right thoracentesis. EXAM: ULTRASOUND GUIDED RIGHT THORACENTESIS MEDICATIONS: 1% lidocaine  10 mL COMPLICATIONS: None immediate. PROCEDURE: An ultrasound guided thoracentesis was thoroughly discussed with the patient and questions answered. The benefits, risks, alternatives and complications were also discussed. The patient understands and wishes to proceed with the procedure. Written consent was obtained. Ultrasound was performed to localize and mark an adequate pocket of fluid in the right chest. The area was then prepped and draped in the normal sterile fashion. 1% Lidocaine  was used for local anesthesia. Under ultrasound guidance a 19 gauge, 7-cm, Yueh catheter was introduced. Thoracentesis was performed. The catheter was removed and a dressing  applied. FINDINGS: A total of approximately 1.6 L of clear, yellow fluid was removed. Samples were sent to the laboratory as requested by the clinical team. IMPRESSION: Successful ultrasound guided right thoracentesis yielding clear, yellow of pleural fluid. Performed By Lavanda Jurist, PA-C Electronically Signed   By: Ester Sides M.D.   On: 07/24/2023 15:28   DG Chest 1 View Result Date: 07/24/2023 CLINICAL DATA:  Status post right-sided thoracentesis EXAM: CHEST  1 VIEW COMPARISON:  07/18/2023 CT. FINDINGS: Patient is oblique to the right. Reverse apical lordotic positioning. Midline trachea. Moderate cardiomegaly. Small bilateral pleural effusions, decreased on the right since the prior CT. No pneumothorax. Diffuse interstitial thickening. Right upper lobe pulmonary opacity as on CT. Left base atelectasis. IMPRESSION: Decreased right pleural effusion without pneumothorax or other acute process. Persistent small left pleural effusion with left base atelectasis. Right upper lobe opacity, as on CT. Electronically Signed   By: Rockey Kilts M.D.   On: 07/24/2023 14:22   CT CHEST ABDOMEN PELVIS WO CONTRAST Result Date: 07/23/2023 CLINICAL DATA:  Metastatic disease evaluation Non-small cell lung cancer (NSCLC), metastatic, assess treatment response. * Tracking Code: BO * EXAM: CT CHEST, ABDOMEN AND PELVIS WITHOUT CONTRAST TECHNIQUE: Multidetector CT imaging of the chest, abdomen and pelvis was performed following the standard protocol without IV contrast. RADIATION DOSE REDUCTION: This exam was performed according to the departmental dose-optimization program which includes automated exposure control, adjustment of the mA and/or kV according to patient size and/or use of iterative reconstruction technique. COMPARISON:  CT scan chest, abdomen and pelvis from 05/01/2023. FINDINGS: CT CHEST FINDINGS Cardiovascular: Normal cardiac size. No pericardial effusion. No aortic aneurysm. There are coronary artery  calcifications, in keeping with coronary artery disease. There are also moderate to severe peripheral atherosclerotic vascular calcifications of thoracic aorta and its major branches. Mediastinum/Nodes: Visualized thyroid  gland appears grossly unremarkable. No solid / cystic mediastinal masses. The esophagus is nondistended precluding optimal assessment. There are few mildly prominent mildly hyperattenuating mediastinal lymph nodes, which do not meet the size criteria for lymphadenopathy and appear grossly similar to the prior study. No axillary lymphadenopathy by size criteria. Evaluation of bilateral hila is limited due to lack on intravenous contrast: however, no large hilar lymphadenopathy identified. Lungs/Pleura: The central tracheo-bronchial tree is patent. There is increasing masslike consolidation with air bronchogram in the right upper lobe. There is also new moderate to large right pleural effusion. There is also new small-to-moderate left pleural effusion. No pneumothorax on either side. There are patchy atelectatic changes in the remaining bilateral lungs. Musculoskeletal: A CT Port-a-Cath is seen in the left upper chest wall with the catheter terminating in the cavo-atrial junction region. Visualized soft tissues of the chest wall are otherwise grossly unremarkable. No suspicious osseous lesions. There are mild multilevel degenerative changes in the visualized spine. CT ABDOMEN PELVIS FINDINGS Hepatobiliary:  The liver is normal in size. Non-cirrhotic configuration. No suspicious mass. No intrahepatic or extrahepatic bile duct dilation. No calcified gallstones. Normal gallbladder wall thickness. No pericholecystic inflammatory changes. Pancreas: Unremarkable. No pancreatic ductal dilatation or surrounding inflammatory changes. Spleen: Normal in size. There are several sub-5 mm calcified granulomas in the spleen. No other focal lesion. Adrenals/Urinary Tract: There is diffuse thickening of bilateral  adrenal glands, without discrete nodule. Findings are nonspecific but mostly associated with adrenal hyperplasia. No suspicious renal mass within the limitations of this unenhanced exam. There is a stable 7 x 10 mm nonobstructing calculus in the right kidney lower pole. No other nephroureterolithiasis on either side. No obstructive uropathy on either side. Urinary bladder is partially distended and therefore not adequately evaluated. 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 Correlate clinically and with urinalysis. No focal mass or bladder calculi. Stomach/Bowel: No disproportionate dilation of the small or large bowel loops. No evidence of abnormal bowel wall thickening or inflammatory changes. The appendix is unremarkable. There are multiple diverticula throughout the colon, without imaging signs of diverticulitis. Vascular/Lymphatic: No ascites or pneumoperitoneum. No abdominal or pelvic lymphadenopathy, by size criteria. No aneurysmal dilation of the major abdominal arteries. There are moderate peripheral atherosclerotic vascular calcifications of the aorta and its major branches. Reproductive: Normal size prostate. Symmetric seminal vesicles. Other: There are bilateral small fat containing inguinal hernias. The soft tissues and abdominal wall are otherwise unremarkable. Musculoskeletal: Stable appearance of sclerotic lesion in the left half of the T10 vertebral body. No pathological fracture. There are mild - moderate multilevel degenerative changes in the visualized spine. L3 through L5 kyphoplasty changes noted. There is posterior spinal fixation of L4/L5 with transpedicular screws and rods. Metallic hardware noted in the right proximal femur. IMPRESSION: 1. There is increasing masslike consolidation with air bronchogram in the right upper lobe which corresponds to previously seen tracer avid mass  and favored to represent combination of patient's known lung mass and or postobstructive pneumonia. 2. There is new moderate to large right pleural effusion and small-to-moderate left pleural effusion. 3. Stable sclerotic lesion in the T10 vertebral body. No pathological fracture. 4. No new metastatic disease identified within the chest, abdomen or pelvis. 5. Multiple other nonacute observations, as described above. Electronically Signed   By: Ree Molt M.D.   On: 07/23/2023 13:50   CT SHOULDER LEFT WO CONTRAST Result Date: 07/23/2023 CLINICAL DATA:  Left shoulder pain.  History of lung cancer. EXAM: CT OF THE UPPER LEFT EXTREMITY WITHOUT CONTRAST TECHNIQUE: Multidetector CT imaging of the upper left extremity was performed according to the standard protocol. RADIATION DOSE REDUCTION: This exam was performed according to the departmental dose-optimization program which includes automated exposure control, adjustment of the mA and/or kV according to patient size and/or use of iterative reconstruction technique. COMPARISON:  None Available. FINDINGS: Bones/Joint/Cartilage No fracture or dislocation. No suspicious osseous lesion. Normal alignment. No sizable joint effusion. Mild glenohumeral joint osteoarthritis with joint space narrowing and glenoid rim osteophytosis. Mild acromioclavicular joint osteoarthritis. Ligaments Ligaments are suboptimally evaluated by CT. Muscles and Tendons Muscles are normal. No muscle atrophy. No intramuscular fluid collection or hematoma. Soft tissue No fluid collection or hematoma. No soft tissue mass. No enlarged lymph nodes identified in the field of view. Partially visualized small to moderate left pleural effusion. Aortic atherosclerotic calcification. IMPRESSION: 1. No suspicious osseous lesion.  No acute osseous abnormality. 2. Mild glenohumeral and  acromioclavicular joint osteoarthritis. 3. Partially visualized small to moderate left pleural effusion. 4.  Aortic  Atherosclerosis (ICD10-I70.0). Electronically Signed   By: Harrietta Sherry M.D.   On: 07/23/2023 13:31     ASSESSMENT/PLAN:  This is a very pleasant 78 year old Caucasian male diagnosed with stage IV (T2b, N3, M1 B) non-small cell lung cancer, adenocarcinoma. He was diagnosed in December 2024. He presented with a large right apical lung mass in addition to right hilar, mediastinal, and supraclavicular lymphadenopathy as well as pulmonary nodules, T10 metastatic bone lesion in addition to a subcutaneous nodule. He has no actionable mutation and there was insufficient material for PD-L1 expression.    He completed palliative radiation to the T10 vertebral body lesion.    He also completed palliative radiation to the right femur.   For systemic treatment he is currently on first-line chemoimmunotherapy with carboplatin  for an AUC of 5 and paclitaxel  175 mg/m on days 1 and 22 in addition to ipilimumab  1 mg/kg on day 1 and nivolumab  360 mg on days 1 and 22.  This will be followed by maintenance treatment with ipilimumab  every 6 weeks and nivolumab  every 3 weeks.  His first dose was on 02/18/2023.  He is status post 3 cycles of chemotherapy. He had a challenging time with chemotherapy and this portion of his treatment was discontinued starting from day 22 cycle #1   He was found to have a DVT. Due to concerns with bleeding, his dose was reduced to 2.5 mg BID after a risks/benefits discussion.   Labs were reviewed. Recommend he *** with cycle #5 today as scheduled.   ***pleural effusion   Pepcid , prilosec  We will see him back in 3 weeks for repeat blood work before undergoing cycle #6.   He saw palliative care yesterday. He is scheduled to see nutrition while in the infusion room today.   The patient was advised to call immediately if she has any concerning symptoms in the interval. The patient voices understanding of current disease status and treatment options and is in agreement with the  current care plan. All questions were answered. The patient knows to call the clinic with any problems, questions or concerns. We can certainly see the patient much sooner if necessary    No orders of the defined types were placed in this encounter.    I spent {CHL ONC TIME VISIT - DTPQU:[id number]} counseling the patient face to face. The total time spent in the appointment was {CHL ONC TIME VISIT - DTPQU:[id number]}.  Aries Kasa L Jamile Sivils, PA-C 08/11/23

## 2023-08-12 ENCOUNTER — Inpatient Hospital Stay

## 2023-08-13 ENCOUNTER — Inpatient Hospital Stay (HOSPITAL_BASED_OUTPATIENT_CLINIC_OR_DEPARTMENT_OTHER): Admitting: Physician Assistant

## 2023-08-13 ENCOUNTER — Inpatient Hospital Stay: Admitting: Dietician

## 2023-08-13 ENCOUNTER — Ambulatory Visit (HOSPITAL_COMMUNITY)
Admission: RE | Admit: 2023-08-13 | Discharge: 2023-08-13 | Disposition: A | Discharge status: Home / Self Care | Source: Ambulatory Visit | Attending: Physician Assistant | Admitting: Physician Assistant

## 2023-08-13 ENCOUNTER — Inpatient Hospital Stay

## 2023-08-13 ENCOUNTER — Inpatient Hospital Stay: Admitting: Nurse Practitioner

## 2023-08-13 ENCOUNTER — Other Ambulatory Visit: Payer: Self-pay | Admitting: Physician Assistant

## 2023-08-13 ENCOUNTER — Inpatient Hospital Stay: Attending: Nurse Practitioner

## 2023-08-13 VITALS — BP 133/86 | HR 103 | Temp 97.7°F | Resp 18 | Ht 70.0 in | Wt 164.1 lb

## 2023-08-13 DIAGNOSIS — C3411 Malignant neoplasm of upper lobe, right bronchus or lung: Secondary | ICD-10-CM | POA: Insufficient documentation

## 2023-08-13 DIAGNOSIS — I130 Hypertensive heart and chronic kidney disease with heart failure and stage 1 through stage 4 chronic kidney disease, or unspecified chronic kidney disease: Secondary | ICD-10-CM | POA: Diagnosis not present

## 2023-08-13 DIAGNOSIS — I509 Heart failure, unspecified: Secondary | ICD-10-CM | POA: Insufficient documentation

## 2023-08-13 DIAGNOSIS — Z95828 Presence of other vascular implants and grafts: Secondary | ICD-10-CM

## 2023-08-13 DIAGNOSIS — Z7901 Long term (current) use of anticoagulants: Secondary | ICD-10-CM | POA: Diagnosis not present

## 2023-08-13 DIAGNOSIS — E785 Hyperlipidemia, unspecified: Secondary | ICD-10-CM | POA: Diagnosis not present

## 2023-08-13 DIAGNOSIS — M8580 Other specified disorders of bone density and structure, unspecified site: Secondary | ICD-10-CM | POA: Diagnosis not present

## 2023-08-13 DIAGNOSIS — I2510 Atherosclerotic heart disease of native coronary artery without angina pectoris: Secondary | ICD-10-CM | POA: Diagnosis not present

## 2023-08-13 DIAGNOSIS — Z794 Long term (current) use of insulin: Secondary | ICD-10-CM | POA: Diagnosis not present

## 2023-08-13 DIAGNOSIS — Z79899 Other long term (current) drug therapy: Secondary | ICD-10-CM | POA: Diagnosis not present

## 2023-08-13 DIAGNOSIS — Z7985 Long-term (current) use of injectable non-insulin antidiabetic drugs: Secondary | ICD-10-CM | POA: Insufficient documentation

## 2023-08-13 DIAGNOSIS — C7951 Secondary malignant neoplasm of bone: Secondary | ICD-10-CM | POA: Diagnosis not present

## 2023-08-13 DIAGNOSIS — N189 Chronic kidney disease, unspecified: Secondary | ICD-10-CM | POA: Diagnosis not present

## 2023-08-13 DIAGNOSIS — E1122 Type 2 diabetes mellitus with diabetic chronic kidney disease: Secondary | ICD-10-CM | POA: Insufficient documentation

## 2023-08-13 DIAGNOSIS — F32A Depression, unspecified: Secondary | ICD-10-CM | POA: Insufficient documentation

## 2023-08-13 LAB — SAMPLE TO BLOOD BANK

## 2023-08-13 LAB — CMP (CANCER CENTER ONLY)
ALT: 13 U/L (ref 0–44)
AST: 18 U/L (ref 15–41)
Albumin: 2.8 g/dL — ABNORMAL LOW (ref 3.5–5.0)
Alkaline Phosphatase: 135 U/L — ABNORMAL HIGH (ref 38–126)
Anion gap: 5 (ref 5–15)
BUN: 22 mg/dL (ref 8–23)
CO2: 29 mmol/L (ref 22–32)
Calcium: 8.4 mg/dL — ABNORMAL LOW (ref 8.9–10.3)
Chloride: 104 mmol/L (ref 98–111)
Creatinine: 2.11 mg/dL — ABNORMAL HIGH (ref 0.61–1.24)
GFR, Estimated: 31 mL/min — ABNORMAL LOW (ref >60)
Glucose, Bld: 129 mg/dL — ABNORMAL HIGH (ref 70–99)
Potassium: 3.8 mmol/L (ref 3.5–5.1)
Sodium: 138 mmol/L (ref 135–145)
Total Bilirubin: 0.4 mg/dL (ref 0.0–1.2)
Total Protein: 6.5 g/dL (ref 6.5–8.1)

## 2023-08-13 LAB — CBC WITH DIFFERENTIAL (CANCER CENTER ONLY)
Abs Immature Granulocytes: 0.03 K/uL (ref 0.00–0.07)
Basophils Absolute: 0 K/uL (ref 0.0–0.1)
Basophils Relative: 1 %
Eosinophils Absolute: 0.1 K/uL (ref 0.0–0.5)
Eosinophils Relative: 2 %
HCT: 31.5 % — ABNORMAL LOW (ref 39.0–52.0)
Hemoglobin: 10.5 g/dL — ABNORMAL LOW (ref 13.0–17.0)
Immature Granulocytes: 0 %
Lymphocytes Relative: 15 %
Lymphs Abs: 1 K/uL (ref 0.7–4.0)
MCH: 31 pg (ref 26.0–34.0)
MCHC: 33.3 g/dL (ref 30.0–36.0)
MCV: 92.9 fL (ref 80.0–100.0)
Monocytes Absolute: 0.6 K/uL (ref 0.1–1.0)
Monocytes Relative: 9 %
Neutro Abs: 5 K/uL (ref 1.7–7.7)
Neutrophils Relative %: 73 %
Platelet Count: 164 K/uL (ref 150–400)
RBC: 3.39 MIL/uL — ABNORMAL LOW (ref 4.22–5.81)
RDW: 16.7 % — ABNORMAL HIGH (ref 11.5–15.5)
WBC Count: 6.7 K/uL (ref 4.0–10.5)
nRBC: 0 % (ref 0.0–0.2)

## 2023-08-13 LAB — TSH: TSH: 7.48 u[IU]/mL — ABNORMAL HIGH (ref 0.350–4.500)

## 2023-08-13 MED ORDER — SODIUM CHLORIDE 0.9% FLUSH
10.0000 mL | Freq: Once | INTRAVENOUS | Status: AC
  Administered 2023-08-13: 10 mL

## 2023-08-13 MED ORDER — HEPARIN SOD (PORK) LOCK FLUSH 100 UNIT/ML IV SOLN
500.0000 [IU] | Freq: Once | INTRAVENOUS | Status: DC
Start: 2023-08-13 — End: 2023-08-13

## 2023-08-13 MED ORDER — SODIUM CHLORIDE 0.9% FLUSH
10.0000 mL | Freq: Once | INTRAVENOUS | Status: DC
Start: 2023-08-13 — End: 2023-08-13

## 2023-08-14 ENCOUNTER — Encounter: Payer: Self-pay | Admitting: Internal Medicine

## 2023-08-14 ENCOUNTER — Ambulatory Visit (HOSPITAL_COMMUNITY)
Admission: RE | Admit: 2023-08-14 | Discharge: 2023-08-14 | Disposition: A | Discharge status: Home / Self Care | Source: Ambulatory Visit | Attending: Radiology | Admitting: Radiology

## 2023-08-14 ENCOUNTER — Other Ambulatory Visit: Payer: Self-pay

## 2023-08-14 ENCOUNTER — Ambulatory Visit (HOSPITAL_COMMUNITY)
Admission: RE | Admit: 2023-08-14 | Discharge: 2023-08-14 | Disposition: A | Discharge status: Home / Self Care | Source: Ambulatory Visit | Attending: Physician Assistant | Admitting: Physician Assistant

## 2023-08-14 DIAGNOSIS — Z85118 Personal history of other malignant neoplasm of bronchus and lung: Secondary | ICD-10-CM | POA: Insufficient documentation

## 2023-08-14 DIAGNOSIS — C3411 Malignant neoplasm of upper lobe, right bronchus or lung: Secondary | ICD-10-CM

## 2023-08-14 DIAGNOSIS — J90 Pleural effusion, not elsewhere classified: Secondary | ICD-10-CM | POA: Diagnosis present

## 2023-08-14 LAB — T4: T4, Total: 4.4 ug/dL — ABNORMAL LOW (ref 4.5–12.0)

## 2023-08-14 MED ORDER — LIDOCAINE-EPINEPHRINE (PF) 2 %-1:200000 IJ SOLN
INTRAMUSCULAR | Status: AC
Start: 2023-08-14 — End: 2023-08-14
  Filled 2023-08-14: qty 20

## 2023-08-14 NOTE — Procedures (Signed)
Ultrasound-guided  therapeutic right  thoracentesis performed yielding 1.7 liters of yellow fluid. No immediate complications. Follow-up chest x-ray pending. EBL none.

## 2023-08-14 NOTE — Progress Notes (Signed)
 Messaged Tonya in nuclear medicine about scheduling patient for Thoracentesis @ [facility].  She spoke with patients daughter and confirmed appt for today @ 230 P.M.

## 2023-08-20 NOTE — Progress Notes (Signed)
 08/20/2023  [facility]   Patient ID:  Jeffery Fernandez is a 78 y.o. (DOB 1945-11-12) male.  Assessment and Plan  Nathanie was seen today for follow-up.  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  Would like a.m. 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 Pleural Effusion. - Recurrent pleural effusions with recent thoracentesis procedures draining 1.6 L and 1.7 L of fluid. - Moderate right and small left effusions noted on the last x-ray. - Advised to monitor pulse oximetry closely; further thoracentesis may be necessary if symptoms worsen or fluid accumulation increases. - Discussed the possibility of placing a drain, but risks of infection were noted.  2. Memory Issues. - Significant memory issues, progressively worsening. - Recent CT scan showed no evidence of bleeding but revealed microvascular ischemic changes. - Continued monitoring and supportive care recommended.  3. Diabetes Mellitus. - A1c decreased to 5, indicating well-controlled diabetes. - Currently on Lipitor, Trulicity , and Lantus . - Lantus  dosage reduced to 30 units for a week; morning blood sugar levels to be monitored. - If morning blood sugar levels remain low (around 100), Lantus  dosage will be further reduced by 10  units. Goal is to maintain morning blood sugar levels around 120-130 to prevent hypoglycemia.  4. Depression. - Currently taking Zoloft  and Wellbutrin  for depression. - No new issues with depression reported. - Continued monitoring and current medication regimen will be maintained.  5. Left Leg Deep Vein Thrombosis (DVT). - On Eliquis  for a left leg DVT, which is common post-surgery. - Continued use of Eliquis  recommended.  Follow-up: The patient will follow up in 3 months.    Health Maintenance Due  Topic Date Due  . COPD Spirometry Testing  Never done  . Zoster Vaccine (1 of 2) Never done  . RSV Adult and Pregnancy (1 - 1-dose 75+ series) Never done  . COVID-19 Vaccine (5 - 2024-25 season) 09/29/2022  . Diabetes Foot Exam  10/18/2022  . Diabetes Annual Microalbumin/Creatinine Ratio  04/16/2023     Risks, benefits, and alternatives of the medications and treatment plan prescribed today were discussed, and patient expressed understanding. Plan follow-up as discussed or as needed if any worsening symptoms or change in condition.    A yearly preventative health exam was recommended and current age based recommendations were discussed.   Subjective   Patient ID:  Jeffery Fernandez is a 78 y.o. (DOB Dec 01, 1945) male    Patient presents with  . Follow-up     History of Present Illness The patient presents for evaluation of pleural effusion, memory issues, diabetes, depression, and left leg DVT.  He has been experiencing fluid accumulation in his lungs, necessitating two drainage procedures in the past three weeks. The first procedure removed 1.6 liters of fluid, and the second, performed at [place], extracted  1.7 liters. These procedures were ordered by Dr. Gatha and performed on an outpatient basis. His most recent radiology visit for pleural effusion was on 08/14/2023, and he had a CT scan on 07/24/2023. He describes his symptoms as feeling like an elephant is sitting on his  chest. He uses 2 liters of oxygen  at night, which improves his comfort during sleep. His pulse oximetry readings have been decreasing when his lung condition worsens. He skipped his last treatment to attend a family reunion but plans to resume on 09/04/2023. Since skipping the treatment, he has not experienced nausea and has not needed to take Zofran  for 3 to 4 weeks. He occasionally takes one pain pill a day. He has undergone one round of chemotherapy and is currently receiving immunotherapy.  A CT scan of his brain was performed due to concerns about his memory, which revealed no evidence of bleeding but did show microvascular ischemic changes. He resides at home with his daughter, who plans to continue caring for him there. He experiences sleep disturbances, often waking up after a few hours of sleep. He takes hydrocodone , lorazepam , and Eliquis  before bed, but these medications do not consistently ensure a full night's sleep. He is under the care of palliative care and a nutritionist. He experiences pain throughout his body and struggles to remain standing even with the aid of equipment. He has a history of falls, occurring at least once a week.  His A1c level has decreased to 5. His weight has fluctuated, decreasing from 163 to 154 pounds and then increasing back to 163 pounds. He is currently taking Lipitor, Trulicity  once a week, and Lantus . His daughter has been administering 30 units of Lantus  for the past week, but they have not been monitoring his blood sugar levels. He reports feeling well.  He continues to take Zoloft  and Wellbutrin  and reports no new issues with depression.  He had a DVT in his left leg and is currently on Eliquis .  He had a severe nosebleed on 07/13/2023, which required packing. He had a fall on 07/28/2023, after which he was checked out and had a head scan.  Social History: Sleep: He sleeps for a couple of hours, then wakes up for a little bit, and goes back to  bed. Living Condition: He resides at home with his daughter.   Current Outpatient Medications  Medication Instructions  . apixaban  (ELIQUIS ) 5 mg, 2 times a day  . atorvastatin  (LIPITOR) 40 mg, Oral, Daily  . buPROPion  HCl (WELLBUTRIN  XL) 150 mg, Oral, Every morning  . gabapentin  (NEURONTIN ) 100 mg, Oral, 3 times daily  . glucose blood (BAYER CONTOUR NEXT TEST) test strip Use as instructed to check blood sugar three time(s) daily  . HYDROcodone -acetaminophen  (NORCO) 5-325 mg per tablet 1 tablet, Every 6 hours as needed  . Insulin  Pen Needle (BD,SURE COMFORT,NOVOFINE) 32G X 4 MM MISC Use with insulin  pen as directed  . Ipilimumab  (YERVOY  IV)   . LANTUS  SOLOSTAR 40 Units, Subcutaneous, Daily  . LORAzepam  (ATIVAN ) 0.5 mg, Every 8 hours as needed  . Nivolumab  (OPDIVO  IV)   . ondansetron  (ZOFRAN ) 4 mg tablet TAKE ONE TABLET BY MOUTH DAILY AS NEEDED FOR NAUSEA.  . prochlorperazine  (COMPAZINE ) 10 mg, Every 6 hours as needed  . sertraline  (ZOLOFT ) 100 mg, Oral, Daily  . TRULICITY  1.5 mg, Subcutaneous, Weekly   Patient Care Team: Alm FORBES Bilis, MD as PCP - General (Family Medicine) Reyes Budge, MD (Neurosurgery) Annabella Scarce, MD (Cardiology) Arley DELENA Ruder, MD (  Ophthalmology) Hurman Reusing, MD (Ophthalmology) Victory LITTIE Legrand DOUGLAS, MD (Gastroenterology) Prentice VEAR Cheshire, MD as Consulting Physician (Nephrology) Social History   Tobacco Use  . Smoking status: Former    Current packs/day: 0.00    Average packs/day: 1.5 packs/day for 36.0 years (54.0 ttl pk-yrs)    Types: Cigarettes    Start date: 01/28/1950    Quit date: 01/28/1986    Years since quitting: 37.5    Passive exposure: Past  . Smokeless tobacco: Former  Substance Use Topics  . Alcohol use: No    Reviewed and updated this visit by provider: Tobacco  Allergies  Meds  Problems  Med Hx  Surg Hx  Fam Hx       Review of Systems is complete and negative except as noted.  Objective   Vitals:   08/20/23 1616   BP: 128/74  Pulse: 98  Temp: 97.6 F (36.4 C)  TempSrc: Temporal  Resp: 16  Height: 5' 9 (1.753 m)  Weight: 163 lb (73.9 kg)  SpO2: 92%  BMI (Calculated): 24.1   Wt Readings from Last 3 Encounters:  08/20/23 163 lb (73.9 kg)  04/25/23 173 lb (78.5 kg)  04/23/23 173 lb (78.5 kg)    Physical Exam Respiratory: Decreased breath sounds at the right base.   Constitutional: Well-developed and well-nourished.  Sitting comfortably conversing normally.  Eyes: Conjunctivae, lids, and EOM are normal. Pupils are equal, round, and reactive to light. Lymphatics: There is no anterior or posterior cervical adenopathy. Neck: Supple with normal range of motion. No thyroid  mass and no thyromegaly present. Cardiovascular: Regular rate and rhythm with normal heart sounds and no edema present. There are no carotid bruits. Respiratory: Decreased breath sounds right greater than left in bases secondary to pleural effusions Musculoskeletal: Gait is normal. Upper and Lower extremities are symmetrical with grossly normal muscle strength and tone with normal range of motion.  Skin: Skin is warm and dry. No rashes or bruising noted.  Psychiatric: Behavior is Cooperative and Polite. Mood euthymyic. Affect is appropriate.  Computer technology was used to create visit note. Consent from the patient/caregiver was obtained prior to its use.  Alm FORBES Bilis, MD

## 2023-08-23 ENCOUNTER — Emergency Department (HOSPITAL_COMMUNITY)

## 2023-08-23 ENCOUNTER — Encounter (HOSPITAL_COMMUNITY): Payer: Self-pay

## 2023-08-23 ENCOUNTER — Other Ambulatory Visit: Payer: Self-pay

## 2023-08-23 ENCOUNTER — Inpatient Hospital Stay (HOSPITAL_COMMUNITY)
Admission: EM | Admit: 2023-08-23 | Discharge: 2023-08-27 | DRG: 291 | Disposition: A | Discharge status: Hospice | Attending: Internal Medicine | Admitting: Internal Medicine

## 2023-08-23 DIAGNOSIS — Z79899 Other long term (current) drug therapy: Secondary | ICD-10-CM

## 2023-08-23 DIAGNOSIS — N179 Acute kidney failure, unspecified: Secondary | ICD-10-CM | POA: Diagnosis not present

## 2023-08-23 DIAGNOSIS — G4733 Obstructive sleep apnea (adult) (pediatric): Secondary | ICD-10-CM | POA: Diagnosis present

## 2023-08-23 DIAGNOSIS — E785 Hyperlipidemia, unspecified: Secondary | ICD-10-CM | POA: Diagnosis present

## 2023-08-23 DIAGNOSIS — Z860100 Personal history of colon polyps, unspecified: Secondary | ICD-10-CM

## 2023-08-23 DIAGNOSIS — R7989 Other specified abnormal findings of blood chemistry: Secondary | ICD-10-CM

## 2023-08-23 DIAGNOSIS — Z961 Presence of intraocular lens: Secondary | ICD-10-CM | POA: Diagnosis present

## 2023-08-23 DIAGNOSIS — Z87891 Personal history of nicotine dependence: Secondary | ICD-10-CM

## 2023-08-23 DIAGNOSIS — Z9181 History of falling: Secondary | ICD-10-CM

## 2023-08-23 DIAGNOSIS — Z794 Long term (current) use of insulin: Secondary | ICD-10-CM | POA: Diagnosis not present

## 2023-08-23 DIAGNOSIS — Z7985 Long-term (current) use of injectable non-insulin antidiabetic drugs: Secondary | ICD-10-CM

## 2023-08-23 DIAGNOSIS — Z9841 Cataract extraction status, right eye: Secondary | ICD-10-CM

## 2023-08-23 DIAGNOSIS — D631 Anemia in chronic kidney disease: Secondary | ICD-10-CM | POA: Diagnosis present

## 2023-08-23 DIAGNOSIS — K219 Gastro-esophageal reflux disease without esophagitis: Secondary | ICD-10-CM | POA: Diagnosis present

## 2023-08-23 DIAGNOSIS — I82442 Acute embolism and thrombosis of left tibial vein: Secondary | ICD-10-CM | POA: Diagnosis present

## 2023-08-23 DIAGNOSIS — J9621 Acute and chronic respiratory failure with hypoxia: Secondary | ICD-10-CM | POA: Diagnosis present

## 2023-08-23 DIAGNOSIS — Z833 Family history of diabetes mellitus: Secondary | ICD-10-CM

## 2023-08-23 DIAGNOSIS — Z7901 Long term (current) use of anticoagulants: Secondary | ICD-10-CM

## 2023-08-23 DIAGNOSIS — I5020 Unspecified systolic (congestive) heart failure: Secondary | ICD-10-CM | POA: Diagnosis not present

## 2023-08-23 DIAGNOSIS — I5021 Acute systolic (congestive) heart failure: Secondary | ICD-10-CM | POA: Diagnosis not present

## 2023-08-23 DIAGNOSIS — N184 Chronic kidney disease, stage 4 (severe): Secondary | ICD-10-CM | POA: Diagnosis present

## 2023-08-23 DIAGNOSIS — Z9981 Dependence on supplemental oxygen: Secondary | ICD-10-CM | POA: Diagnosis not present

## 2023-08-23 DIAGNOSIS — I82409 Acute embolism and thrombosis of unspecified deep veins of unspecified lower extremity: Secondary | ICD-10-CM | POA: Diagnosis present

## 2023-08-23 DIAGNOSIS — F419 Anxiety disorder, unspecified: Secondary | ICD-10-CM | POA: Diagnosis present

## 2023-08-23 DIAGNOSIS — I130 Hypertensive heart and chronic kidney disease with heart failure and stage 1 through stage 4 chronic kidney disease, or unspecified chronic kidney disease: Principal | ICD-10-CM | POA: Diagnosis present

## 2023-08-23 DIAGNOSIS — I429 Cardiomyopathy, unspecified: Secondary | ICD-10-CM | POA: Diagnosis present

## 2023-08-23 DIAGNOSIS — Z981 Arthrodesis status: Secondary | ICD-10-CM

## 2023-08-23 DIAGNOSIS — I5033 Acute on chronic diastolic (congestive) heart failure: Secondary | ICD-10-CM | POA: Diagnosis present

## 2023-08-23 DIAGNOSIS — Z66 Do not resuscitate: Secondary | ICD-10-CM | POA: Diagnosis present

## 2023-08-23 DIAGNOSIS — H5461 Unqualified visual loss, right eye, normal vision left eye: Secondary | ICD-10-CM | POA: Diagnosis present

## 2023-08-23 DIAGNOSIS — Z515 Encounter for palliative care: Secondary | ICD-10-CM | POA: Diagnosis not present

## 2023-08-23 DIAGNOSIS — Z7189 Other specified counseling: Secondary | ICD-10-CM | POA: Diagnosis not present

## 2023-08-23 DIAGNOSIS — Z8249 Family history of ischemic heart disease and other diseases of the circulatory system: Secondary | ICD-10-CM

## 2023-08-23 DIAGNOSIS — E1122 Type 2 diabetes mellitus with diabetic chronic kidney disease: Secondary | ICD-10-CM | POA: Diagnosis present

## 2023-08-23 DIAGNOSIS — J189 Pneumonia, unspecified organism: Secondary | ICD-10-CM

## 2023-08-23 DIAGNOSIS — I2510 Atherosclerotic heart disease of native coronary artery without angina pectoris: Secondary | ICD-10-CM | POA: Diagnosis present

## 2023-08-23 DIAGNOSIS — C3411 Malignant neoplasm of upper lobe, right bronchus or lung: Secondary | ICD-10-CM | POA: Diagnosis present

## 2023-08-23 DIAGNOSIS — I493 Ventricular premature depolarization: Secondary | ICD-10-CM | POA: Diagnosis not present

## 2023-08-23 DIAGNOSIS — Z8261 Family history of arthritis: Secondary | ICD-10-CM

## 2023-08-23 DIAGNOSIS — E119 Type 2 diabetes mellitus without complications: Secondary | ICD-10-CM

## 2023-08-23 DIAGNOSIS — I447 Left bundle-branch block, unspecified: Secondary | ICD-10-CM | POA: Diagnosis present

## 2023-08-23 DIAGNOSIS — N189 Chronic kidney disease, unspecified: Secondary | ICD-10-CM

## 2023-08-23 DIAGNOSIS — R0602 Shortness of breath: Secondary | ICD-10-CM | POA: Diagnosis present

## 2023-08-23 DIAGNOSIS — I129 Hypertensive chronic kidney disease with stage 1 through stage 4 chronic kidney disease, or unspecified chronic kidney disease: Secondary | ICD-10-CM | POA: Diagnosis not present

## 2023-08-23 DIAGNOSIS — Z9842 Cataract extraction status, left eye: Secondary | ICD-10-CM

## 2023-08-23 DIAGNOSIS — I824Z2 Acute embolism and thrombosis of unspecified deep veins of left distal lower extremity: Secondary | ICD-10-CM | POA: Diagnosis not present

## 2023-08-23 DIAGNOSIS — J449 Chronic obstructive pulmonary disease, unspecified: Secondary | ICD-10-CM | POA: Diagnosis present

## 2023-08-23 DIAGNOSIS — Z87442 Personal history of urinary calculi: Secondary | ICD-10-CM

## 2023-08-23 DIAGNOSIS — I50811 Acute right heart failure: Secondary | ICD-10-CM | POA: Diagnosis present

## 2023-08-23 DIAGNOSIS — F32A Depression, unspecified: Secondary | ICD-10-CM | POA: Diagnosis present

## 2023-08-23 DIAGNOSIS — Z8049 Family history of malignant neoplasm of other genital organs: Secondary | ICD-10-CM

## 2023-08-23 DIAGNOSIS — C3490 Malignant neoplasm of unspecified part of unspecified bronchus or lung: Secondary | ICD-10-CM | POA: Diagnosis present

## 2023-08-23 DIAGNOSIS — Z823 Family history of stroke: Secondary | ICD-10-CM

## 2023-08-23 DIAGNOSIS — Z86718 Personal history of other venous thrombosis and embolism: Secondary | ICD-10-CM

## 2023-08-23 DIAGNOSIS — R0902 Hypoxemia: Principal | ICD-10-CM

## 2023-08-23 DIAGNOSIS — Z888 Allergy status to other drugs, medicaments and biological substances status: Secondary | ICD-10-CM

## 2023-08-23 DIAGNOSIS — E11649 Type 2 diabetes mellitus with hypoglycemia without coma: Secondary | ICD-10-CM | POA: Diagnosis present

## 2023-08-23 DIAGNOSIS — Z801 Family history of malignant neoplasm of trachea, bronchus and lung: Secondary | ICD-10-CM

## 2023-08-23 DIAGNOSIS — M7989 Other specified soft tissue disorders: Secondary | ICD-10-CM | POA: Diagnosis not present

## 2023-08-23 DIAGNOSIS — Z923 Personal history of irradiation: Secondary | ICD-10-CM

## 2023-08-23 DIAGNOSIS — I951 Orthostatic hypotension: Secondary | ICD-10-CM | POA: Diagnosis not present

## 2023-08-23 DIAGNOSIS — C7951 Secondary malignant neoplasm of bone: Secondary | ICD-10-CM | POA: Diagnosis present

## 2023-08-23 DIAGNOSIS — R0609 Other forms of dyspnea: Secondary | ICD-10-CM | POA: Diagnosis not present

## 2023-08-23 DIAGNOSIS — I824Z9 Acute embolism and thrombosis of unspecified deep veins of unspecified distal lower extremity: Secondary | ICD-10-CM | POA: Diagnosis not present

## 2023-08-23 DIAGNOSIS — I5032 Chronic diastolic (congestive) heart failure: Secondary | ICD-10-CM | POA: Diagnosis present

## 2023-08-23 DIAGNOSIS — Z820 Family history of epilepsy and other diseases of the nervous system: Secondary | ICD-10-CM

## 2023-08-23 DIAGNOSIS — Z880 Allergy status to penicillin: Secondary | ICD-10-CM

## 2023-08-23 DIAGNOSIS — I10 Essential (primary) hypertension: Secondary | ICD-10-CM | POA: Diagnosis present

## 2023-08-23 LAB — BASIC METABOLIC PANEL WITH GFR
Anion gap: 10 (ref 5–15)
BUN: 22 mg/dL (ref 8–23)
CO2: 24 mmol/L (ref 22–32)
Calcium: 8.6 mg/dL — ABNORMAL LOW (ref 8.9–10.3)
Chloride: 104 mmol/L (ref 98–111)
Creatinine, Ser: 2.37 mg/dL — ABNORMAL HIGH (ref 0.61–1.24)
GFR, Estimated: 27 mL/min — ABNORMAL LOW (ref >60)
Glucose, Bld: 98 mg/dL (ref 70–99)
Potassium: 3.9 mmol/L (ref 3.5–5.1)
Sodium: 138 mmol/L (ref 135–145)

## 2023-08-23 LAB — CBC
HCT: 33.7 % — ABNORMAL LOW (ref 39.0–52.0)
Hemoglobin: 10.6 g/dL — ABNORMAL LOW (ref 13.0–17.0)
MCH: 30.8 pg (ref 26.0–34.0)
MCHC: 31.5 g/dL (ref 30.0–36.0)
MCV: 98 fL (ref 80.0–100.0)
Platelets: 191 K/uL (ref 150–400)
RBC: 3.44 MIL/uL — ABNORMAL LOW (ref 4.22–5.81)
RDW: 16.8 % — ABNORMAL HIGH (ref 11.5–15.5)
WBC: 7 K/uL (ref 4.0–10.5)
nRBC: 0 % (ref 0.0–0.2)

## 2023-08-23 LAB — ECHOCARDIOGRAM COMPLETE
Est EF: <20
S' Lateral: 5 cm

## 2023-08-23 LAB — TROPONIN I (HIGH SENSITIVITY)
Troponin I (High Sensitivity): 48 ng/L — ABNORMAL HIGH (ref <18)
Troponin I (High Sensitivity): 52 ng/L — ABNORMAL HIGH (ref <18)

## 2023-08-23 LAB — PROCALCITONIN: Procalcitonin: <0.1 ng/mL

## 2023-08-23 LAB — BRAIN NATRIURETIC PEPTIDE: B Natriuretic Peptide: 2109.4 pg/mL — ABNORMAL HIGH (ref 0.0–100.0)

## 2023-08-23 LAB — GLUCOSE, CAPILLARY
Glucose-Capillary: 104 mg/dL — ABNORMAL HIGH (ref 70–99)
Glucose-Capillary: 46 mg/dL — ABNORMAL LOW (ref 70–99)
Glucose-Capillary: 49 mg/dL — ABNORMAL LOW (ref 70–99)

## 2023-08-23 LAB — I-STAT CG4 LACTIC ACID, ED: Lactic Acid, Venous: 0.8 mmol/L (ref 0.5–1.9)

## 2023-08-23 MED ORDER — BUPROPION HCL ER (XL) 150 MG PO TB24
150.0000 mg | ORAL_TABLET | Freq: Every morning | ORAL | Status: DC
  Administered 2023-08-24 – 2023-08-27 (×4): 150 mg via ORAL
  Filled 2023-08-23 (×4): qty 1

## 2023-08-23 MED ORDER — APIXABAN 5 MG PO TABS
5.0000 mg | ORAL_TABLET | Freq: Two times a day (BID) | ORAL | Status: DC
  Administered 2023-08-23 – 2023-08-25 (×5): 5 mg via ORAL
  Filled 2023-08-23 (×6): qty 1

## 2023-08-23 MED ORDER — SODIUM CHLORIDE 0.9 % IV SOLN
1.0000 g | Freq: Once | INTRAVENOUS | Status: AC
  Administered 2023-08-23: 1 g via INTRAVENOUS
  Filled 2023-08-23: qty 10

## 2023-08-23 MED ORDER — FUROSEMIDE 10 MG/ML IJ SOLN
40.0000 mg | Freq: Once | INTRAMUSCULAR | Status: AC
Start: 2023-08-23 — End: 2023-08-23
  Administered 2023-08-23: 40 mg via INTRAVENOUS
  Filled 2023-08-23: qty 4

## 2023-08-23 MED ORDER — LORAZEPAM 0.5 MG PO TABS
0.5000 mg | ORAL_TABLET | Freq: Every evening | ORAL | Status: DC | PRN
  Administered 2023-08-23: 0.5 mg via ORAL
  Filled 2023-08-23: qty 1

## 2023-08-23 MED ORDER — SODIUM CHLORIDE 0.9 % IV SOLN
500.0000 mg | Freq: Once | INTRAVENOUS | Status: AC
  Administered 2023-08-23: 500 mg via INTRAVENOUS
  Filled 2023-08-23 (×2): qty 5

## 2023-08-23 MED ORDER — INSULIN ASPART 100 UNIT/ML IJ SOLN: 0.0000 [IU] | Freq: Three times a day (TID) | INTRAMUSCULAR | Status: DC

## 2023-08-23 MED ORDER — ATORVASTATIN CALCIUM 40 MG PO TABS
40.0000 mg | ORAL_TABLET | Freq: Every day | ORAL | Status: DC
  Administered 2023-08-24 – 2023-08-25 (×2): 40 mg via ORAL
  Filled 2023-08-23 (×2): qty 1

## 2023-08-23 MED ORDER — FAMOTIDINE 20 MG PO TABS
20.0000 mg | ORAL_TABLET | Freq: Every day | ORAL | Status: DC
  Administered 2023-08-24 – 2023-08-27 (×4): 20 mg via ORAL
  Filled 2023-08-23 (×4): qty 1

## 2023-08-23 MED ORDER — SERTRALINE HCL 100 MG PO TABS
100.0000 mg | ORAL_TABLET | Freq: Every morning | ORAL | Status: DC
  Administered 2023-08-24 – 2023-08-27 (×4): 100 mg via ORAL
  Filled 2023-08-23 (×4): qty 1

## 2023-08-23 MED ORDER — ACETAMINOPHEN 325 MG PO TABS
650.0000 mg | ORAL_TABLET | Freq: Four times a day (QID) | ORAL | Status: DC | PRN
  Administered 2023-08-23 – 2023-08-27 (×3): 650 mg via ORAL
  Filled 2023-08-23 (×3): qty 2

## 2023-08-23 MED ORDER — SENNOSIDES-DOCUSATE SODIUM 8.6-50 MG PO TABS: 1.0000 | ORAL_TABLET | Freq: Every evening | ORAL | Status: DC | PRN

## 2023-08-23 MED ORDER — ACETAMINOPHEN 650 MG RE SUPP: 650.0000 mg | Freq: Four times a day (QID) | RECTAL | Status: DC | PRN

## 2023-08-23 NOTE — ED Notes (Signed)
Pt brief and linen changed. Pt repositioned in bed.

## 2023-08-23 NOTE — ED Provider Notes (Signed)
 [place] [facility] Provider Note   CSN: [id number] Arrival date & time: 08/23/23  1241     Patient presents with: Shortness of Breath and Flank Pain   Jeffery Fernandez is a 78 y.o. male with past medical history of adenocarcinoma of the lung, congestive heart failure, chronic kidney disease, hypertension, hyperlipidemia, left bundle branch block presents to emergency room with complaint of shortness of breath.  Patient reports that this started this morning.  Notes when he woke up his oxygen  was okay not requiring any nasal cannula.  Had some shortness of breath when walking around and his significant other checked his pulse ox which showed he was at 87% which improved after being placed on 3 L nasal cannula.  Have not noticed any increased work of breathing or swelling in lower extremity.  Has been compliant with Eliquis , dx with DVT 2 months ago and still having pain and swelling of this extremity.  Denies fever or productive cough. Denies CP.     Shortness of Breath Flank Pain Associated symptoms include shortness of breath.       Prior to Admission medications   Medication Sig Start Date End Date Taking? Authorizing Provider  apixaban  (ELIQUIS ) 5 MG TABS tablet Take 1 tablet (5 mg total) by mouth 2 (two) times daily. 07/24/23   Heilingoetter, Cassandra L, PA-C  atorvastatin  (LIPITOR) 40 MG tablet Take 40 mg by mouth at bedtime.    [provider]  buPROPion  (WELLBUTRIN  XL) 150 MG 24 hr tablet Take 150 mg by mouth every morning.    [provider]  doxycycline  (VIBRA -TABS) 100 MG tablet Take 1 tablet (100 mg total) by mouth 2 (two) times daily. 07/24/23   Heilingoetter, Cassandra L, PA-C  Dulaglutide  (TRULICITY ) 1.5 MG/0.5ML SOAJ Inject 1.5 mg into the skin daily. Takes on Thursdays 10/01/22   [provider]  famotidine  (PEPCID ) 20 MG tablet Take 1 tablet (20 mg total) by mouth 2 (two) times daily. Patient taking differently:  Take 20 mg by mouth daily. 03/04/23   Pickenpack-Cousar, Fannie SAILOR, NP  gabapentin  (NEURONTIN ) 100 MG capsule Take 1 capsule (100 mg total) by mouth daily. 01/02/23   Celestina Czar, MD  HYDROcodone -acetaminophen  (NORCO/VICODIN) 5-325 MG tablet Take 1 tablet by mouth every 6 (six) hours as needed for moderate pain (pain score 4-6). 07/16/23   Pickenpack-Cousar, Athena N, NP  insulin  glargine (LANTUS ) 100 UNIT/ML injection Inject 41 Units into the skin in the morning.    [provider]  loratadine  (CLARITIN ) 10 MG tablet Take 10 mg by mouth daily.    [provider]  LORazepam  (ATIVAN ) 0.5 MG tablet Take 1 tablet (0.5 mg total) by mouth every 8 (eight) hours as needed for anxiety. 0.5 mg p.o. every 8 hours as needed for anxiety/nausea. 08/09/23   Pickenpack-Cousar, Athena N, NP  ondansetron  (ZOFRAN ) 4 MG tablet Take 1 tablet (4 mg total) by mouth every 8 (eight) hours as needed for nausea or vomiting. 08/09/23   Pickenpack-Cousar, Fannie SAILOR, NP  OXYGEN  Inhale 1.5 L into the lungs daily as needed (Shortness of breath).    [provider]  prochlorperazine  (COMPAZINE ) 10 MG tablet Take 1 tablet (10 mg total) by mouth every 6 (six) hours as needed for nausea or vomiting. 02/26/23   Pickenpack-Cousar, Fannie SAILOR, NP  sertraline  (ZOLOFT ) 100 MG tablet Take 100 mg by mouth in the morning.    [provider]    Allergies: Amlodipine , Penicillins, and Hydralazine   Review of Systems  Respiratory:  Positive for shortness of breath.   Genitourinary:  Positive for flank pain.    Updated Vital Signs BP (!) 148/104 (BP Location: Left Arm)   Pulse 99   Temp (!) 97.5 F (36.4 C)   Resp 18   SpO2 99%   Physical Exam Vitals and nursing note reviewed.  Constitutional:      General: He is not in acute distress.    Appearance: He is not toxic-appearing.  HENT:     Head: Normocephalic and atraumatic.  Eyes:     General: No scleral icterus.    Conjunctiva/sclera: Conjunctivae  normal.  Cardiovascular:     Rate and Rhythm: Normal rate and regular rhythm.     Pulses: Normal pulses.     Heart sounds: Normal heart sounds.  Pulmonary:     Effort: Pulmonary effort is normal. No respiratory distress.     Breath sounds: Examination of the left-lower field reveals decreased breath sounds. Decreased breath sounds present.  Abdominal:     General: Abdomen is flat. Bowel sounds are normal.     Palpations: Abdomen is soft.     Tenderness: There is no abdominal tenderness.  Skin:    General: Skin is warm and dry.     Findings: No lesion.  Neurological:     General: No focal deficit present.     Mental Status: He is alert and oriented to person, place, and time. Mental status is at baseline.     (all labs ordered are listed, but only abnormal results are displayed) Labs Reviewed  CBC - Abnormal; Notable for the following components:      Result Value   RBC 3.44 (*)    Hemoglobin 10.6 (*)    HCT 33.7 (*)    RDW 16.8 (*)    All other components within normal limits  BASIC METABOLIC PANEL WITH GFR  BRAIN NATRIURETIC PEPTIDE    EKG: EKG Interpretation Date/Time:  Saturday August 23 2023 13:14:21 EDT Ventricular Rate:  102 PR Interval:  164 QRS Duration:  146 QT Interval:  408 QTC Calculation: 531 R Axis:   -4  Text Interpretation: Sinus tachycardia Left bundle branch block Abnormal ECG When compared with ECG of 28-Jul-2023 12:05, PREVIOUS ECG IS PRESENT Confirmed by Yolande Charleston [phone]) on 08/23/2023 1:30:58 PM  Radiology: [facility] Chest 2 View Result Date: 08/23/2023 CLINICAL DATA:  Shortness of breath. EXAM: CHEST - 2 VIEW COMPARISON:  08/14/2023 FINDINGS: Stable enlarged cardiac silhouette. Left jugular catheter tip in the superior aspect of the right atrium, proximally 1 cm inferior to the superior cavoatrial junction. Increased airspace consolidation in the right lung, most pronounced in the outer upper lung zone. Clear left lung. Small bilateral pleural  effusions. Mildly elevated right hemidiaphragm. Cervical spine fixation hardware. IMPRESSION: 1. Increased right lung airspace consolidation, most pronounced in the outer upper lung zone, consistent with pneumonia. 2. Mild right basilar atelectasis. 3. Small bilateral pleural effusions. 4. Stable cardiomegaly. Electronically Signed   By: Elspeth Bathe M.D.   On: 08/23/2023 14:08     Procedures   Medications Ordered in the ED - No data to display                                  Medical Decision Making Amount and/or Complexity of Data Reviewed Labs: ordered. Radiology: ordered.  Risk Prescription drug management. Decision regarding hospitalization.   This patient presents  to the ED for concern of SOB, this involves an extensive number of treatment options, and is a complaint that carries with it a high risk of complications and morbidity.  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    Lab Tests:  I personally interpreted labs.  The pertinent results include:   No leukocytosis.  Hemoglobin 10.6. Creatinine 2.37 consistent with prior recent lab   Imaging Studies ordered:  I ordered imaging studies including chest x-ray I independently visualized and interpreted imaging which showed increased right lung space consolidation consistent with pneumonia, small bilateral pleural effusion and stable cardiomegaly I agree with the radiologist interpretation   Cardiac Monitoring: / EKG:  The patient was maintained on a cardiac monitor.  I personally viewed and interpreted the cardiac monitored which showed  an underlying rhythm of: Sinus tachycardia with left bundle branch block   Consultations Obtained:  I requested consultation with the hospitalist,  and discussed lab and imaging findings as well as pertinent plan - they recommend: admit. Dr Yolande will do POCUS to r/o heart strain.    Problem List / ED Course / Critical interventions / Medication management  Patient reports with increasing need for oxygen .  Typically only requiring oxygen  with exertion at 2 L nasal cannula he is now requiring 4 L nasal cannula and had increased shortness of breath.  He does have history of cancer.  Has been requiring thoracentesis, last 1 was approximately 8 days ago.  Today his x-ray shows some small bilateral pleural effusions, of note patient has had similar x-ray readings and still required approximately 1 L taken off with thoracentesis.  X-ray also shows consolidation consistent with pneumonia of right lung.  He was recently treated for pneumonia outpatient with doxycycline , given treatment failure will give IV azithromycin  and Rocephin .  Also diagnosed with DVT 2 months ago and continues to have left lower calf swelling and pain.  Will repeat DVT study given kidney function will order VQ scan to rule out PE. BNP is elevated at 2000, has not been elevated like this in the past.  Does have history of heart failure but had echo with normal EF 10/2022.  Will diurese and give IV antibiotic.  Will work on getting him admitted for monitoring symptoms and getting VQ scan and DVT study. I ordered medication including Abx for PNA, Lasix   Reevaluation of the patient after these medicines showed that the patient improved I have reviewed the patients home medicines and have made adjustments as needed        Final diagnoses:  Hypoxia  Pneumonia due to infectious organism, unspecified laterality, unspecified part of lung  Elevated brain natriuretic peptide (BNP) level    ED Discharge Orders     None           Shermon Warren LOISE DEVONNA 08/23/23 1525    Yolande Lamar BROCKS, MD 08/23/23 [phone]

## 2023-08-23 NOTE — Progress Notes (Signed)
  Echocardiogram 2D Echocardiogram has been performed.  Jeffery Fernandez 08/23/2023, 4:49 PM

## 2023-08-23 NOTE — H&P (Cosign Needed Addendum)
 Date: 08/23/2023               Patient Name:  Jeffery Fernandez MRN: [id number]  DOB: [birth year] Age / Sex: 78 y.o., male   PCP: Pura Lenis, MD         Medical Service: Internal Medicine Teaching Service         Attending Physician: Dr. Ronnald Sergeant      First Contact: Melvenia Morrison, MD    Second Contact: Dr. Ozell Nearing, DO          Pager Information: First Contact Pager: [phone]   Second Contact Pager: [phone]   SUBJECTIVE   Chief Complaint: Shortness of breath  History of Present Illness: Jeffery Fernandez is a 78 y.o. male with PMH of metastatic NSCLC on chemotherapy, recurrent pleural effusion, HFpEF, DMII (last A1C 5.0 08/20/23), Depression, anxiety, CKDIV, HTN, HLD who presented with worsening shortness of breath. He has had a productive cough for 6 months which has been worsening somewhat over time. He underwent 2 thoracentesis in the past three weeks and was placed on doxycycline  for 14 days with intermittent improvement, but now has worsening shortness of breath. He is on 2L [place] at home but it was raised to 3L as his SpO2 was 87%. He has orthopnea. He is on Eliquis  5mg  BID for a DVT and has been compliant other than stopping for about a week in June for an uncontrollable nose bleed. He denies chest pain, hemoptysis, diarrhea, constipation, urinary symptoms, abdominal pain, fever and sick contacts. He does have some nausea that has been ongoing since starting chemo.  Last chemo-immunotherapy over 4 weeks ago.  In the ED he was hypoxic on presentation requiring 4L of nasal cannula. Labs significant for elevated BNP to 2109. He received 40mg  IV Lasix  in the ED. He underwent a STAT echo which showed worsening left sided heart failure with an EF of less than 20% which is new since a previous echo showed HFpEF. Chest x-ray showed increased right lung airspace consolidation, most pronounced in the outer upper lung zone, consistent with pneumonia, basilar atelectasis and small bilateral  pleural effusions. VQ study ordered.  Meds:  Patient reported:  -Eliquis  5 mg BID (last dose 7/26) -atorvastatin  40 mg daily -bupropion  150 mg daily  -Trulicity  1.5 mg weekly -famotidine  20 mg daily -Norco 5-325 mg Q6H PRN  -Lantus  30 units daily  -loratadine  10 mg daily -Ativan  0.5 mg at bedtime  -Zofran  4 mg PRN  -sertraline  100 mg daily First-line treatment with chemoimmunotherapy in the form of carboplatin  for AUC of 5 and paclitaxel  175 Mg/M2 on days 1 and 22 in addition to ipilimumab  1 Mg/KG on day 1 and nivolumab  360 Mg IV on days 1 and 22 for 1 cycle followed by maintenance treatment with ipilimumab  1 Mg/KG every 6 weeks and nivolumab  360 Mg IV every 3 weeks. First dose February 18, 2023. Chemotherapy was discontinued starting from day 22 of cycle #1 secondary to intolerance.   -NOT TAKING: gabapentin   Current Meds  Medication Sig   apixaban  (ELIQUIS ) 5 MG TABS tablet Take 1 tablet (5 mg total) by mouth 2 (two) times daily.   atorvastatin  (LIPITOR) 40 MG tablet Take 40 mg by mouth at bedtime.   buPROPion  (WELLBUTRIN  XL) 150 MG 24 hr tablet Take 150 mg by mouth every morning.   Dulaglutide  (TRULICITY ) 1.5 MG/0.5ML SOAJ Inject 1.5 mg into the skin daily. Takes on Thursdays   HYDROcodone -acetaminophen  (NORCO/VICODIN) 5-325 MG tablet Take 1 tablet by  mouth every 6 (six) hours as needed for moderate pain (pain score 4-6).   insulin  glargine (LANTUS ) 100 UNIT/ML injection Inject 41 Units into the skin in the morning.   loratadine  (CLARITIN ) 10 MG tablet Take 10 mg by mouth daily.   LORazepam  (ATIVAN ) 0.5 MG tablet Take 1 tablet (0.5 mg total) by mouth every 8 (eight) hours as needed for anxiety. 0.5 mg p.o. every 8 hours as needed for anxiety/nausea.   ondansetron  (ZOFRAN ) 4 MG tablet Take 1 tablet (4 mg total) by mouth every 8 (eight) hours as needed for nausea or vomiting.   sertraline  (ZOLOFT ) 100 MG tablet Take 100 mg by mouth in the morning.    Past Medical History -NSCLC  metastasis to back and femur  -DMII -Depression -Anxiety  -CKDIV  -HTN -HLD -HFpEF  Past Medical History:  Diagnosis Date   Acid reflux    Arrhythmia    Arthritis    Cataract    bilateral cataract extraction with intraoccular lens implants   Chest pain, unspecified    CHF (congestive heart failure) (HCC)    Chronic airway obstruction, not elsewhere classified    PT. DENIES HE HAS COPD   CKD (chronic kidney disease)    Colon polyps    Adenomatous Polyps 2007   Depression    Diabetes mellitus    Diverticulosis    Gout    Heart murmur    History of kidney stones    History of radiation therapy    Thoracic Spine- 01/27/23-02/10/23- Dr. Lynwood Nasuti   Hyperlipemia    Hypertension    Lung cancer (HCC) 12/2022   Nephrolithiasis    Normocytic anemia 04/23/2017   Obstructive sleep apnea (adult) (pediatric)    Other and unspecified hyperlipidemia    Other left bundle branch block    Retinal vascular occlusion, unspecified    right   Shortness of breath    on exertion   Unspecified sleep apnea    Vision loss of right eye    White coat syndrome with diagnosis of hypertension 09/19/2017   Past Surgical History Past Surgical History:  Procedure Laterality Date   ANTERIOR CERVICAL DECOMP/DISCECTOMY FUSION N/A 06/26/2016   Procedure: Re-Exploration of Cervical Wound;  Surgeon: Onetha Kuba, MD;  Location: [facility] OR;  Service: Neurosurgery;  Laterality: N/A;   ANTERIOR CERVICAL DECOMP/DISCECTOMY FUSION N/A 06/26/2016   Procedure: ANTERIOR CERVICAL DECOMPRESSION/DISCECTOMY FUSION, INTERBODY PROSTESIS, PLATE, CERVICAL THREE CERVICAL FOUR, CERVICAL FOUR CERVICAL FIVE CERVICAL SIX;  Surgeon: Mavis Purchase, MD;  Location: [facility] OR;  Service: Neurosurgery;  Laterality: N/A;   APPLICATION OF WOUND VAC  06/05/2020   Procedure: APPLICATION OF WOUND VAC;  Surgeon: Mavis Purchase, MD;  Location[facility] OR;  Service: Neurosurgery;;   BACK SURGERY     CATARACT EXTRACTION Bilateral    FEMUR IM NAIL  Right 05/06/2023   Procedure: RIGHT FEMUR NAILING;  Surgeon: Genelle Standing, MD;  Location: [facility] OR;  Service: Orthopedics;  Laterality: Right;  RIGHT INTERTROCHANTERIC INTRAMEDULLARY HIP NAIL   IR IMAGING GUIDED PORT INSERTION  02/19/2023   IR THORACENTESIS ASP PLEURAL SPACE W/IMG GUIDE  07/24/2023   KIDNEY STONE SURGERY     KYPHOPLASTY N/A 01/19/2018   Procedure: KYPHOPLASTY LUMBAR THREE;  Surgeon: Mavis Purchase, MD;  Location: [facility] OR;  Service: Neurosurgery;  Laterality: N/A;  KYPHOPLASTY LUMBAR THREE   LUMBAR WOUND DEBRIDEMENT N/A 06/05/2020   Procedure: LUMBAR WOUND DEBRIDEMENT;  Surgeon: Mavis Purchase, MD;  Location: [facility] OR;  Service: Neurosurgery;  Laterality: N/A;  Social:  Lives With: wife  Support: family  Level of Function: independent with ADL, requires walker and cane  PCP: Pura Lenis, MD  Substances: -Tobacco: former, quit 35 years 1.5 ppd x 30 years -Alcohol: denies  -Recreational Drug: denies  Family History:  -sister: lung cancer  Family History  Problem Relation Age of Onset   Cancer Mother    Uterine cancer Mother    Heart disease Father    Stroke Father    Cancer Sister        Lung cancer   Heart attack Brother 60       heart attack and CHF/ also some form of EP ablation   Parkinson's disease Brother    Atrial fibrillation Brother    Arthritis/Rheumatoid Cousin    Diabetes Other    Colon cancer Neg Hx    Stomach cancer Neg Hx    Liver cancer Neg Hx    Rectal cancer Neg Hx    Esophageal cancer Neg Hx      Allergies: Allergies as of 08/23/2023 - Review Complete 08/23/2023  Allergen Reaction Noted   Amlodipine  Swelling 11/28/2022   Penicillins Other (See Comments) 06/08/2010   Hydralazine  Other (See Comments) 11/28/2022    Review of Systems: A complete ROS was negative except as per HPI.   OBJECTIVE:   Physical Exam: Blood pressure (!) 148/104, pulse 99, temperature (!) 97.5 F (36.4 C), resp. rate 18, SpO2 99%.  Constitutional: some  respiratory distress HENT: mucous membranes moist Eyes: conjunctiva non-erythematous Cardiovascular: tachycardia with irregular rhythm Pulmonary/Chest: increased work of breathing with accessory muscle use, decreased lung sounds at the right base Abdominal: soft, non-tender, non-distended Extremities: No edema Skin: warm and dry  Labs: CBC    Component Value Date/Time   WBC 7.0 08/23/2023 1313   RBC 3.44 (L) 08/23/2023 1313   HGB 10.6 (L) 08/23/2023 1313   HGB 10.5 (L) 08/13/2023 1331   HCT 33.7 (L) 08/23/2023 1313   PLT 191 08/23/2023 1313   PLT 164 08/13/2023 1331   MCV 98.0 08/23/2023 1313   MCH 30.8 08/23/2023 1313   MCHC 31.5 08/23/2023 1313   RDW 16.8 (H) 08/23/2023 1313   LYMPHSABS 1.0 08/13/2023 1331   MONOABS 0.6 08/13/2023 1331   EOSABS 0.1 08/13/2023 1331   BASOSABS 0.0 08/13/2023 1331     CMP     Component Value Date/Time   NA 138 08/23/2023 1313   NA 142 07/03/2022 1356   K 3.9 08/23/2023 1313   CL 104 08/23/2023 1313   CO2 24 08/23/2023 1313   GLUCOSE 98 08/23/2023 1313   BUN 22 08/23/2023 1313   BUN 22 07/03/2022 1356   CREATININE 2.37 (H) 08/23/2023 1313   CREATININE 2.11 (H) 08/13/2023 1331   CALCIUM  8.6 (L) 08/23/2023 1313   PROT 6.5 08/13/2023 1331   PROT 7.4 07/03/2022 1356   ALBUMIN  2.8 (L) 08/13/2023 1331   ALBUMIN  4.1 07/03/2022 1356   AST 18 08/13/2023 1331   ALT 13 08/13/2023 1331   ALKPHOS 135 (H) 08/13/2023 1331   BILITOT 0.4 08/13/2023 1331   GFRNONAA 27 (L) 08/23/2023 1313   GFRNONAA 31 (L) 08/13/2023 1331   GFRAA 47 (L) 01/14/2018 1131    Imaging:  ECHOCARDIOGRAM COMPLETE Result Date: 08/23/2023    ECHOCARDIOGRAM REPORT   Patient Name:   Jeffery Fernandez Date of Exam: 08/23/2023 Medical Rec #:  [id number]     Height:       70.0 in Accession #:    [id number]  Weight:       164.1 lb Date of Birth:  December 09, 1945      BSA:          1.919 m Patient Age:    78 years      BP:           148/104 mmHg Patient Gender: M             HR:            110 bpm. Exam Location:  Inpatient Procedure: 2D Echo (Both Spectral and Color Flow Doppler were utilized during            procedure). STAT ECHO Indications:    dyspnea  History:        Patient has prior history of Echocardiogram examinations, most                 recent 11/25/2022. CAD, COPD, Arrythmias:LBBB; Risk                 Factors:Diabetes, Sleep Apnea, Hypertension and Dyslipidemia.  Sonographer:    Tinnie Barefoot RDCS Referring Phys: [id number] ROBERT C PATERSON IMPRESSIONS  1. Left ventricular ejection fraction, by estimation, is <20%. The left ventricle has severely decreased function. The left ventricle demonstrates global hypokinesis. The left ventricular internal cavity size was dilated. Left ventricular diastolic function could not be evaluated.  2. Right ventricular systolic function is mildly reduced. The right ventricular size is normal. Tricuspid regurgitation signal is inadequate for assessing PA pressure.  3. Left atrial size was moderately dilated.  4. The mitral valve is degenerative. Moderate mitral valve regurgitation. No evidence of mitral stenosis.  5. The aortic valve is grossly normal. Aortic valve regurgitation is mild. Aortic valve sclerosis is present, with no evidence of aortic valve stenosis.  6. The inferior vena cava is normal in size with greater than 50% respiratory variability, suggesting right atrial pressure of 3 mmHg. Comparison(s): A prior study was performed on 11/25/2022. LVEF significantly reduced compared to prior study (previously reported 55-60%). Conclusion(s)/Recommendation(s): Results conveyed to ordering physician Dr. Jakie via secure chat (message reviewed). Also notified primary team (Dr. Zheng and Andoni Busch) Dr. Elicia acknowledged the message. FINDINGS  Left Ventricle: Left ventricular ejection fraction, by estimation, is <20%. The left ventricle has severely decreased function. The left ventricle demonstrates global hypokinesis. The left ventricular  internal cavity size was dilated. There is no left ventricular hypertrophy. Left ventricular diastolic function could not be evaluated due to nondiagnostic images. Left ventricular diastolic function could not be evaluated. Right Ventricle: The right ventricular size is normal. Right vetricular wall thickness was not well visualized. Right ventricular systolic function is mildly reduced. Tricuspid regurgitation signal is inadequate for assessing PA pressure. Left Atrium: Left atrial size was moderately dilated. Right Atrium: Right atrial size was normal in size. Pericardium: There is no evidence of pericardial effusion. Mitral Valve: The mitral valve is degenerative in appearance. Mild mitral annular calcification. Moderate mitral valve regurgitation. No evidence of mitral valve stenosis. Tricuspid Valve: The tricuspid valve is grossly normal. Tricuspid valve regurgitation is not demonstrated. No evidence of tricuspid stenosis. Aortic Valve: The aortic valve is grossly normal. Aortic valve regurgitation is mild. Aortic valve sclerosis is present, with no evidence of aortic valve stenosis. Pulmonic Valve: The pulmonic valve was not well visualized. Pulmonic valve regurgitation is not visualized. No evidence of pulmonic stenosis. Aorta: The aortic root is normal in size and structure and the ascending aorta was not well visualized. Venous: The inferior  vena cava is normal in size with greater than 50% respiratory variability, suggesting right atrial pressure of 3 mmHg. IAS/Shunts: The interatrial septum was not well visualized.  LEFT VENTRICLE PLAX 2D LVIDd:         5.10 cm LVIDs:         5.00 cm LV PW:         1.10 cm LV IVS:        1.10 cm LVOT diam:     1.90 cm LV SV:         22 LV SV Index:   12 LVOT Area:     2.84 cm  RIGHT VENTRICLE            IVC RV Basal diam:  3.10 cm    IVC diam: 2.00 cm RV S prime:     9.68 cm/s TAPSE (M-mode): 1.5 cm LEFT ATRIUM           Index        RIGHT ATRIUM           Index LA diam:       3.70 cm 1.93 cm/m   RA Area:     14.00 cm LA Vol (A4C): 87.5 ml 45.60 ml/m  RA Volume:   34.50 ml  17.98 ml/m  AORTIC VALVE LVOT Vmax:   62.20 cm/s LVOT Vmean:  38.600 cm/s LVOT VTI:    0.078 m  AORTA Ao Root diam: 3.20 cm  SHUNTS Systemic VTI:  0.08 m Systemic Diam: 1.90 cm Sunit Tolia Electronically signed by Madonna Large Signature Date/Time: 08/23/2023/5:16:09 PM    Final    DG Chest 2 View Result Date: 08/23/2023 CLINICAL DATA:  Shortness of breath. EXAM: CHEST - 2 VIEW COMPARISON:  08/14/2023 FINDINGS: Stable enlarged cardiac silhouette. Left jugular catheter tip in the superior aspect of the right atrium, proximally 1 cm inferior to the superior cavoatrial junction. Increased airspace consolidation in the right lung, most pronounced in the outer upper lung zone. Clear left lung. Small bilateral pleural effusions. Mildly elevated right hemidiaphragm. Cervical spine fixation hardware. IMPRESSION: 1. Increased right lung airspace consolidation, most pronounced in the outer upper lung zone, consistent with pneumonia. 2. Mild right basilar atelectasis. 3. Small bilateral pleural effusions. 4. Stable cardiomegaly. Electronically Signed   By: Elspeth Bathe M.D.   On: 08/23/2023 14:08     EKG: personally reviewed my interpretation is PVC with Sinus rhythm.  ASSESSMENT & PLAN:   Assessment & Plan by Problem: Principal Problem:   Acute on chronic hypoxic respiratory failure (HCC)   Demarco H Jeffery Fernandez is a 78 y.o. person living with a history of metastatic NSCLC on chemotherapy, recurrent pleural effusion, HFpEF, DMII (last A1C 5.0 08/20/23), Depression, anxiety, CKDIV, HTN, HLD who presented with shortness of breath and admitted for acute on chronic respiratory failure.  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 No sick symptoms. Chest x-ray shows worsening  opacity of the right lung and pleural effusions. Differential includes pleural effusion, pneumonia, and PE. Has had recurrent pleural effusions with two thoracentesis in the last month per report, although there are only records for the thoracentesis on 7/17, performed [facility], likely in the setting of his malignancy, but could be in the setting of his worsening heart failure, discussed below. I suspect that his pleural effusion has worsened and may need thoracentesis again. Pneumonia less likely  as he has received adequate coverage for CAP without any effect and he is without fever or leukocytosis. PE less likely as he has been taking Eliquis  5mg  BID recently. We will get a VQ scan to rule out PE but this does not need to be done urgently, as there are no signs of right heart strain, he is already on Eliquis  5mg , and there are other more plausible explanations. Wells score 4.  Plan: -ultrasound of right lung to evaluate for fluid pocket, may require thoracentesis tomorrow -wean O2 as tolerated -V/Q scan -low suspicion for infection, d/c antibiotics -holding diuresis given euvolemia.   #HFrEF #Elevated BNP #CAD Previous echo 11/25/22 showed EF of 55-60%. Had a catheterization in 2012 with 60-70% in the RCA. Echo today showed left ventricular ejection fraction of <20% with global hypokinesis with dilation. Right ventricular systolic function is mildly reduced. Left atrial dilation with mitral valve regurgitation and right atrial pressure of . EKG showed sinus rhythm although he had irregularity on exam. Cardiology consulted given worsening heart failure. Worsening EF could be due to chemotherapy toxicity. Lack of RV dysfunction further decreases suspicion for PE. He appears euvolemic on exam today. BNP 2100.   Plan: -Cardiology consulted -Troponins x2 -Lactic acid -Repeat EKG -Atorvastatin  40mg   #CKDIV Baseline around 2.5. Cr 2.37 on admission. Avoiding contrast from CTA to rule out  PE -Avoid nephrotoxic meds -daily BMP  #DMII  Family reports hypoglycemic event to 30 2 days ago. Home meds Trulicity  and Lantus  30 units. Last A1C 5.0 this month. Blood glucose 98 on admission.  Plan: -Hold long acting insulin  in the setting of normoglycemia and reported hypoglycemia -VSSSI  #Anemia Hb on admission 10.6 around baseline of 10. Suspect anemia of chronic disease due to CKD and NSCLC  #Depression/Anxiety -Continue home medications of Ativan  0.5mg  at bedtime PRN, Zoloft  100mg , and Buproprion 150mg .  #GERD -continue home Famotidine  20mg   #Hx of DVT -Continue Eliquis  5mg   Best practice: Diet: Normal VTE: Eliquis  5mg  BID Code: DNR/DNI Surrogate Decision Maker: wife and daughter   Disposition planning: Prior to Admission Living Arrangement: Home, living with wife Anticipated Discharge Location: Home  Dispo: Admit patient to Observation with expected length of stay less than 2 midnights.  Signed: Napoleon Limes, MD Internal Medicine Resident  08/23/2023, 5:49 PM  Please contact IM Residency On-Call Pager at: [phone] or [phone].

## 2023-08-23 NOTE — ED Triage Notes (Signed)
 Pt has hx of lung cancer, wears 3L [place] at baseline, pt c.o increased sob lately and left sided flank pain

## 2023-08-23 NOTE — Progress Notes (Signed)
 VASCULAR LAB    Arrived at patient's room at 1545 and 2 MDs were in the room and pharmacy was waiting outside.  Additional ED providers arrived with POC US  machine to do echo.  The Vascular Lab will reattempt study as schedule permits.    Juluis Fitzsimmons, RVT 08/23/2023, 3:57 PM

## 2023-08-23 NOTE — Plan of Care (Signed)
 Pt received to room w/o incident. Aox4, HoH, c/o chronic back pain. VSS on 4L [place]. Expiratory wheezing heard bilaterally. Abd soft, nontender. Pt voiding w/o difficulty. Nonpitting edema to BLE. Scattered abrasions and redness to BUE. Blanchable redness to sacrum. Safety precautions in place, call light within reach. Family at bedside, will take belongings home.

## 2023-08-23 NOTE — ED Notes (Signed)
 CCMD contacted to place the patient on cardiac monitoring services.

## 2023-08-23 NOTE — Hospital Course (Signed)
 Jeffery Fernandez is a 78 y.o. person living with a history of metastatic NSCLC on chemotherapy, recurrent pleural effusion, HFpEF, DMII (last A1C 5.0 08/20/23), Depression, anxiety, CKDIV, HTN, HLD who presented with shortness of breath and admitted for acute on chronic respiratory failure.   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 No sick symptoms. Chest x-ray shows worsening opacity of the right lung and pleural effusions. Differential includes pleural effusion, pneumonia, and PE. Has had recurrent pleural effusions with two thoracentesis in the last month per report, although there are only records for the thoracentesis on 7/17, performed at [facility], likely in the setting of his malignancy, but could be in the setting of his worsening heart failure, discussed below. Radiology performed thoracentesis and removed 2.25L of transudative effusion.  Pneumonia less likely as he has received adequate coverage for CAP without any effect and he is without fever or leukocytosis. PE less likely as he has been taking Eliquis  5mg  BID recently.  VQ scan without PE.  He eventually elected to start hospice care and plan was to originally get an indwelling catheter placed however there was inadequate fluid to safely place this catheter and so patient and family elected for discharge without catheter placement.  Fluid management with as needed Lasix  40 mg daily that can be titrated by hospice provider.     #HFrEF (<20%) #Elevated BNP #CAD Previous echo 11/25/22 showed EF of 55-60%. Had a catheterization in 2012 with 60-70% in the RCA. Cardiology consulted due to newly reduced EF to <20% and global hypokinesis, not candidate for aggressive care given hx of worsening and uncurable lung cancer due to intolerance of immunotherapy.  Worsening EF could be due to chemotherapy toxicity.   Did well with IV diuresis although developed an AKI and underwent right thoracentesis during admission.  As above plan for as needed Lasix  40 mg daily with hospice care for symptomatic control of fluid overload.  Discharged on room air.  Afterload reduction with Imdur  and hydralazine  was discontinued due to risk of falls and orthostatic hypotension.   #CKD IV Baseline around 2.5. Cr 2.37 on admission. Peaked at 2.96, likely in the setting of diuresis and thoracentesis.  This is removed after holding diuresis.  #DMII  Hypoglycemic on admission with significant medications of Lantus  and Trulicity  at home.  A1c 5.0 recently.  All diabetes medications discontinued with transition to hospice.      #Chronic Anemia Hb on admission 10.6 around baseline of 10. Suspect anemia of chronic disease due to CKD and NSCLC.  Stable during admission.   #Depression/Anxiety Continued home medications of Ativan  0.5mg  at bedtime PRN, Zoloft  100mg , and Buproprion 150mg .   #GERD Continued home Famotidine  20mg  daily    #Hx of LLE DVT Continued home Eliquis  5mg  BID after shared decision making with the patient and his daughter as he transitions to hospice.

## 2023-08-23 NOTE — ED Notes (Signed)
 Pt brief changed & repositioned in bed

## 2023-08-23 NOTE — Consult Note (Signed)
 Cardiology Consultation   Patient ID: Jeffery Fernandez MRN: [id number]; DOB: 05/08/1945  Admit date: 08/23/2023 Date of Consult: 08/23/2023  PCP:  Jeffery Lenis, MD   [facility] Providers Cardiologist:  Jeffery Scarce, MD        Patient Profile: Jeffery Fernandez is a 78 y.o. male with a hx of chronic HFpEF who is being seen 08/23/2023 for the evaluation of newly depressed ejection fraction at the request of Teaching Service.  History of Present Illness: Jeffery Fernandez was admitted today for progressive SOB.  He's had several pleural effusions tapped in the last few weeks, and per his wife used to be intermittently but now constantly on home O2 with limited exertional capacity and orthopnea.  No chest pain. Some LE edema intermittently.  Fatigued, but no syncope.  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 ECG shows sinus tach w/ a LBBB not too different from his last ECG on 6/30  Cr is 2.3 (baseline usually high 2-3).  BNP of 2100, previously 42 in 11/2022.  Echo today, in constrast to his last echo in 10/2022 shows a newly depressed EF in the < 20% range with a dilated LV/LA and moderate MR. IVC is mildly dilated w/ respiratory variation.  On apixaban  for prior DVT.  Past Medical History:  Diagnosis Date   Acid reflux    Arrhythmia    Arthritis    Cataract    bilateral cataract extraction with intraoccular lens implants   Chest pain, unspecified    CHF (congestive heart failure) (HCC)    Chronic airway obstruction, not elsewhere classified    PT. DENIES HE HAS COPD   CKD (chronic kidney disease)    Colon polyps    Adenomatous Polyps 2007   Depression    Diabetes mellitus    Diverticulosis    Gout    Heart murmur    History of kidney stones    History of radiation therapy    Thoracic Spine- 01/27/23-02/10/23- Dr. Lynwood Nasuti   Hyperlipemia    Hypertension    Lung cancer  (HCC) 12/2022   Nephrolithiasis    Normocytic anemia 04/23/2017   Obstructive sleep apnea (adult) (pediatric)    Other and unspecified hyperlipidemia    Other left bundle branch block    Retinal vascular occlusion, unspecified    right   Shortness of breath    on exertion   Unspecified sleep apnea    Vision loss of right eye    White coat syndrome with diagnosis of hypertension 09/19/2017    Past Surgical History:  Procedure Laterality Date   ANTERIOR CERVICAL DECOMP/DISCECTOMY FUSION N/A 06/26/2016   Procedure: Re-Exploration of Cervical Wound;  Surgeon: Onetha Kuba, MD;  Location: [facility] OR;  Service: Neurosurgery;  Laterality: N/A;   ANTERIOR CERVICAL DECOMP/DISCECTOMY FUSION N/A 06/26/2016   Procedure: ANTERIOR CERVICAL DECOMPRESSION/DISCECTOMY FUSION, INTERBODY PROSTESIS, PLATE, CERVICAL THREE CERVICAL FOUR, CERVICAL FOUR CERVICAL FIVE CERVICAL SIX;  Surgeon: Mavis Purchase, MD;  Location: [facility] OR;  Service: Neurosurgery;  Laterality: N/A;   APPLICATION OF WOUND VAC  06/05/2020   Procedure: APPLICATION OF WOUND VAC;  Surgeon: Mavis Purchase, MD;  Location: [facility] OR;  Service: Neurosurgery;;   BACK SURGERY     CATARACT EXTRACTION Bilateral    FEMUR IM NAIL Right 05/06/2023   Procedure: RIGHT FEMUR NAILING;  Surgeon: Genelle Standing, MD;  Location: [facility] OR;  Service: Orthopedics;  Laterality:  Right;  RIGHT INTERTROCHANTERIC INTRAMEDULLARY HIP NAIL   IR IMAGING GUIDED PORT INSERTION  02/19/2023   IR THORACENTESIS ASP PLEURAL SPACE W/IMG GUIDE  07/24/2023   KIDNEY STONE SURGERY     KYPHOPLASTY N/A 01/19/2018   Procedure: KYPHOPLASTY LUMBAR THREE;  Surgeon: Mavis Purchase, MD;  Location: [facility] OR;  Service: Neurosurgery;  Laterality: N/A;  KYPHOPLASTY LUMBAR THREE   LUMBAR WOUND DEBRIDEMENT N/A 06/05/2020   Procedure: LUMBAR WOUND DEBRIDEMENT;  Surgeon: Mavis Purchase, MD;  Location: [facility] OR;  Service: Neurosurgery;  Laterality: N/A;       Scheduled Meds:  apixaban   5 mg Oral BID   [START ON  08/24/2023] atorvastatin   40 mg Oral QHS   [START ON 08/24/2023] buPROPion   150 mg Oral q morning   [START ON 08/24/2023] famotidine   20 mg Oral Daily   [START ON 08/24/2023] insulin  aspart  0-6 Units Subcutaneous TID WC   [START ON 08/24/2023] sertraline   100 mg Oral q AM   Continuous Infusions:  PRN Meds: acetaminophen  **OR** acetaminophen , LORazepam , senna-docusate  Allergies:    Allergies  Allergen Reactions   Amlodipine  Swelling    Patient report facial swelling at high doses   Penicillins Other (See Comments)    05/06/23 tolerated cefazolin ;   Hydralazine  Other (See Comments)    Dizziness    Social History:   Social History   Socioeconomic History   Marital status: Married    Spouse name: Jeffery Fernandez   Number of children: 1   Years of education: Not on file   Highest education level: Not on file  Occupational History   Occupation: Retired    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 Orginally from [place]  and moved back there after retirement. He  Has a daughter living in [place]. Quit smoking around 1988. Married   Right-handed   Caffeine: soda daily, decaf coffee   Social Drivers of Corporate investment banker Strain: Low Risk  (04/20/2023)   Received from [facility]   Overall Financial Resource Strain (CARDIA)    Difficulty of Paying Living Expenses: Not hard at all  Food  Insecurity: No Food Insecurity (05/06/2023)   Hunger Vital Sign    Worried About Running Out of Food in the Last Year: Never true    Ran Out of Food in the Last Year: Never true  Transportation Needs: No Transportation Needs (05/06/2023)   PRAPARE - Administrator, Civil Service (Medical): No    Lack of Transportation (Non-Medical): No  Physical Activity: Unknown (04/20/2023)   Received from [facility]   Exercise Vital Sign    On average, how many days per week do you engage in moderate to strenuous exercise (like a brisk walk)?: 0 days  Minutes of Exercise per Session: Not on file  Stress: Stress Concern Present (04/20/2023)   Received from [facility] of Occupational Health - Occupational Stress Questionnaire    Feeling of Stress : Rather much  Social Connections: Patient Declined (05/06/2023)   Social Connection and Isolation Panel    Frequency of Communication with Friends and Family: Patient declined    Frequency of Social Gatherings with Friends and Family: Patient declined    Attends Religious Services: Patient declined    Database administrator or Organizations: Patient declined    Attends Banker Meetings: Patient declined    Marital Status: Patient declined  Intimate Partner Violence: Not At Risk (05/06/2023)   Humiliation, Afraid, Rape, and Kick questionnaire    Fear of Current or Ex-Partner: No    Emotionally Abused: No    Physically Abused: No    Sexually Abused: No    Family History:    Family History  Problem Relation Age of Onset   Cancer Mother    Uterine cancer Mother    Heart disease Father    Stroke Father    Cancer Sister        Lung cancer   Heart attack Brother 60       heart attack and CHF/ also some form of EP ablation   Parkinson's disease Brother    Atrial fibrillation Brother    Arthritis/Rheumatoid Cousin    Diabetes Other    Colon cancer Neg Hx    Stomach cancer Neg Hx    Liver cancer Neg Hx     Rectal cancer Neg Hx    Esophageal cancer Neg Hx      ROS:  Please see the history of present illness.   All other ROS reviewed and negative.     Physical Exam/Data: Vitals:   08/23/23 1246 08/23/23 1650  BP: (!) 148/104   Pulse: 99   Resp: 18   Temp: (!) 97.5 F (36.4 C) (!) 97.5 F (36.4 C)  TempSrc:  Oral  SpO2: 99%    No intake or output data in the 24 hours ending 08/23/23 1829    08/13/2023    1:59 PM 07/28/2023   12:04 PM 07/24/2023    2:49 PM  Last 3 Weights  Weight (lbs) 164 lb 1.6 oz 158 lb 158 lb 14.4 oz  Weight (kg) 74.435 kg 71.668 kg 72.077 kg     There is no height or weight on file to calculate BMI.  General:  Well nourished, frail elderly gentleman in no acute distress HEENT: normal Neck: could not visualize JVP Vascular: Distal pulses 1+ bilaterally, warm Cardiac:  normal S1, S2; tachy; soft systolic murmur  Lungs:  L>R rhonchi Abd: soft, nontender, no hepatomegaly  Ext: trace edema Skin: warm and dry    Laboratory Data:  Chemistry Recent Labs  Lab 08/23/23 1313  NA 138  K 3.9  CL 104  CO2 24  GLUCOSE 98  BUN 22  CREATININE 2.37*  CALCIUM  8.6*  GFRNONAA 27*  ANIONGAP 10   Hematology Recent Labs  Lab 08/23/23 1313  WBC 7.0  RBC 3.44*  HGB 10.6*  HCT 33.7*  MCV 98.0  MCH 30.8  MCHC 31.5  RDW 16.8*  PLT 191   TSH elevated at 7.4 on 08/12/23 with slightly low free T4 at 4.4.  BNP Recent Labs  Lab 08/23/23 1313  BNP 2,109.4*    Radiology/Studies:  ECHOCARDIOGRAM COMPLETE Result Date: 08/23/2023  ECHOCARDIOGRAM REPORT   Patient Name:   Jeffery Fernandez Date of Exam: 08/23/2023 Medical Rec #:  [id number]     Height:       70.0 in Accession #:    [id number]    Weight:       164.1 lb Date of Birth:  04-19-1945      BSA:          1.919 m Patient Age:    78 years      BP:           148/104 mmHg Patient Gender: M             HR:           110 bpm. Exam Location:  Inpatient Procedure: 2D Echo (Both Spectral and Color Flow Doppler were  utilized during            procedure). STAT ECHO Indications:    dyspnea  History:        Patient has prior history of Echocardiogram examinations, most                 recent 11/25/2022. CAD, COPD, Arrythmias:LBBB; Risk                 Factors:Diabetes, Sleep Apnea, Hypertension and Dyslipidemia.  Sonographer:    Tinnie Barefoot RDCS Referring Phys: [phone] ROBERT C PATERSON IMPRESSIONS  1. Left ventricular ejection fraction, by estimation, is <20%. The left ventricle has severely decreased function. The left ventricle demonstrates global hypokinesis. The left ventricular internal cavity size was dilated. Left ventricular diastolic function could not be evaluated.  2. Right ventricular systolic function is mildly reduced. The right ventricular size is normal. Tricuspid regurgitation signal is inadequate for assessing PA pressure.  3. Left atrial size was moderately dilated.  4. The mitral valve is degenerative. Moderate mitral valve regurgitation. No evidence of mitral stenosis.  5. The aortic valve is grossly normal. Aortic valve regurgitation is mild. Aortic valve sclerosis is present, with no evidence of aortic valve stenosis.  6. The inferior vena cava is normal in size with greater than 50% respiratory variability, suggesting right atrial pressure of 3 mmHg. Comparison(s): A prior study was performed on 11/25/2022. LVEF significantly reduced compared to prior study (previously reported 55-60%). Conclusion(s)/Recommendation(s): Results conveyed to ordering physician Dr. Jakie via secure chat (message reviewed). Also notified primary team (Dr. Zheng and Smucker) Dr. Elicia acknowledged the message. FINDINGS  Left Ventricle: Left ventricular ejection fraction, by estimation, is <20%. The left ventricle has severely decreased function. The left ventricle demonstrates global hypokinesis. The left ventricular internal cavity size was dilated. There is no left ventricular hypertrophy. Left ventricular diastolic  function could not be evaluated due to nondiagnostic images. Left ventricular diastolic function could not be evaluated. Right Ventricle: The right ventricular size is normal. Right vetricular wall thickness was not well visualized. Right ventricular systolic function is mildly reduced. Tricuspid regurgitation signal is inadequate for assessing PA pressure. Left Atrium: Left atrial size was moderately dilated. Right Atrium: Right atrial size was normal in size. Pericardium: There is no evidence of pericardial effusion. Mitral Valve: The mitral valve is degenerative in appearance. Mild mitral annular calcification. Moderate mitral valve regurgitation. No evidence of mitral valve stenosis. Tricuspid Valve: The tricuspid valve is grossly normal. Tricuspid valve regurgitation is not demonstrated. No evidence of tricuspid stenosis. Aortic Valve: The aortic valve is grossly normal. Aortic valve regurgitation is mild. Aortic valve sclerosis is present, with no evidence of aortic valve  stenosis. Pulmonic Valve: The pulmonic valve was not well visualized. Pulmonic valve regurgitation is not visualized. No evidence of pulmonic stenosis. Aorta: The aortic root is normal in size and structure and the ascending aorta was not well visualized. Venous: The inferior vena cava is normal in size with greater than 50% respiratory variability, suggesting right atrial pressure of 3 mmHg. IAS/Shunts: The interatrial septum was not well visualized.  LEFT VENTRICLE PLAX 2D LVIDd:         5.10 cm LVIDs:         5.00 cm LV PW:         1.10 cm LV IVS:        1.10 cm LVOT diam:     1.90 cm LV SV:         22 LV SV Index:   12 LVOT Area:     2.84 cm  RIGHT VENTRICLE            IVC RV Basal diam:  3.10 cm    IVC diam: 2.00 cm RV S prime:     9.68 cm/s TAPSE (M-mode): 1.5 cm LEFT ATRIUM           Index        RIGHT ATRIUM           Index LA diam:      3.70 cm 1.93 cm/m   RA Area:     14.00 cm LA Vol (A4C): 87.5 ml 45.60 ml/m  RA Volume:   34.50  ml  17.98 ml/m  AORTIC VALVE LVOT Vmax:   62.20 cm/s LVOT Vmean:  38.600 cm/s LVOT VTI:    0.078 m  AORTA Ao Root diam: 3.20 cm  SHUNTS Systemic VTI:  0.08 m Systemic Diam: 1.90 cm Sunit Tolia Electronically signed by Madonna Large Signature Date/Time: 08/23/2023/5:16:09 PM    Final    DG Chest 2 View Result Date: 08/23/2023 CLINICAL DATA:  Shortness of breath. EXAM: CHEST - 2 VIEW COMPARISON:  08/14/2023 FINDINGS: Stable enlarged cardiac silhouette. Left jugular catheter tip in the superior aspect of the right atrium, proximally 1 cm inferior to the superior cavoatrial junction. Increased airspace consolidation in the right lung, most pronounced in the outer upper lung zone. Clear left lung. Small bilateral pleural effusions. Mildly elevated right hemidiaphragm. Cervical spine fixation hardware. IMPRESSION: 1. Increased right lung airspace consolidation, most pronounced in the outer upper lung zone, consistent with pneumonia. 2. Mild right basilar atelectasis. 3. Small bilateral pleural effusions. 4. Stable cardiomegaly. Electronically Signed   By: Elspeth Bathe M.D.   On: 08/23/2023 14:08   Assessment and Plan: Acute systolic heart failure with evidence of mild volume overload.  Renal function probably not too far from baseline.  Increased O2 requirement, BP stable.  He would benefit from some afterload reduction.  Given chronic renal insufficiency with CrCL<30, might want to avoid ACE/ARB and spironolactone, suggest hydralazine  10 tid and isosorbide  mononitrate 30 to start.  Diurese gently - 40 IV bid to start w goal to get him negative about 1L a day.  He would benefit long-term from a beta-blocker but would hold off for now.  Etiology of his cardiopathy may be related to carboplatin  therapy, however, at his age, ischemic etiology also possible.  Given lack of anginal symptoms, however, I don't think a cardiac catheterization is needed at present, nor aligned w/ his goals of care.    Risk  Assessment/Risk Scores:       New [place]  Heart Association (NYHA)  Functional Class NYHA Class III       For questions or updates, please contact [facility] Please consult [email or web address] for contact info under    Signed, Andee Flatten, MD  08/23/2023 6:29 PM

## 2023-08-23 NOTE — ED Notes (Signed)
 Warm blankets provided & pt repositioned in bed

## 2023-08-24 ENCOUNTER — Inpatient Hospital Stay (HOSPITAL_COMMUNITY)

## 2023-08-24 DIAGNOSIS — C3490 Malignant neoplasm of unspecified part of unspecified bronchus or lung: Secondary | ICD-10-CM

## 2023-08-24 DIAGNOSIS — N184 Chronic kidney disease, stage 4 (severe): Secondary | ICD-10-CM

## 2023-08-24 DIAGNOSIS — I5021 Acute systolic (congestive) heart failure: Secondary | ICD-10-CM | POA: Diagnosis not present

## 2023-08-24 DIAGNOSIS — E11649 Type 2 diabetes mellitus with hypoglycemia without coma: Secondary | ICD-10-CM

## 2023-08-24 DIAGNOSIS — Z794 Long term (current) use of insulin: Secondary | ICD-10-CM

## 2023-08-24 DIAGNOSIS — I129 Hypertensive chronic kidney disease with stage 1 through stage 4 chronic kidney disease, or unspecified chronic kidney disease: Secondary | ICD-10-CM

## 2023-08-24 DIAGNOSIS — I824Z9 Acute embolism and thrombosis of unspecified deep veins of unspecified distal lower extremity: Secondary | ICD-10-CM

## 2023-08-24 DIAGNOSIS — M7989 Other specified soft tissue disorders: Secondary | ICD-10-CM

## 2023-08-24 DIAGNOSIS — C7951 Secondary malignant neoplasm of bone: Secondary | ICD-10-CM

## 2023-08-24 DIAGNOSIS — E1122 Type 2 diabetes mellitus with diabetic chronic kidney disease: Secondary | ICD-10-CM

## 2023-08-24 LAB — HEPATIC FUNCTION PANEL
ALT: 12 U/L (ref 0–44)
AST: 17 U/L (ref 15–41)
Albumin: 2.1 g/dL — ABNORMAL LOW (ref 3.5–5.0)
Alkaline Phosphatase: 92 U/L (ref 38–126)
Bilirubin, Direct: <0.1 mg/dL (ref 0.0–0.2)
Total Bilirubin: 0.4 mg/dL (ref 0.0–1.2)
Total Protein: 6 g/dL — ABNORMAL LOW (ref 6.5–8.1)

## 2023-08-24 LAB — PROTEIN, PLEURAL OR PERITONEAL FLUID: Total protein, fluid: <3 g/dL

## 2023-08-24 LAB — CBC
HCT: 33 % — ABNORMAL LOW (ref 39.0–52.0)
Hemoglobin: 10.7 g/dL — ABNORMAL LOW (ref 13.0–17.0)
MCH: 30.9 pg (ref 26.0–34.0)
MCHC: 32.4 g/dL (ref 30.0–36.0)
MCV: 95.4 fL (ref 80.0–100.0)
Platelets: 182 K/uL (ref 150–400)
RBC: 3.46 MIL/uL — ABNORMAL LOW (ref 4.22–5.81)
RDW: 16.7 % — ABNORMAL HIGH (ref 11.5–15.5)
WBC: 7.1 K/uL (ref 4.0–10.5)
nRBC: 0 % (ref 0.0–0.2)

## 2023-08-24 LAB — BASIC METABOLIC PANEL WITH GFR
Anion gap: 9 (ref 5–15)
BUN: 23 mg/dL (ref 8–23)
CO2: 25 mmol/L (ref 22–32)
Calcium: 8.6 mg/dL — ABNORMAL LOW (ref 8.9–10.3)
Chloride: 106 mmol/L (ref 98–111)
Creatinine, Ser: 2.36 mg/dL — ABNORMAL HIGH (ref 0.61–1.24)
GFR, Estimated: 27 mL/min — ABNORMAL LOW (ref >60)
Glucose, Bld: 51 mg/dL — ABNORMAL LOW (ref 70–99)
Potassium: 3.9 mmol/L (ref 3.5–5.1)
Sodium: 140 mmol/L (ref 135–145)

## 2023-08-24 LAB — LACTATE DEHYDROGENASE: LDH: 131 U/L (ref 98–192)

## 2023-08-24 LAB — GLUCOSE, CAPILLARY
Glucose-Capillary: 52 mg/dL — ABNORMAL LOW (ref 70–99)
Glucose-Capillary: 72 mg/dL (ref 70–99)
Glucose-Capillary: 129 mg/dL — ABNORMAL HIGH (ref 70–99)
Glucose-Capillary: 155 mg/dL — ABNORMAL HIGH (ref 70–99)
Glucose-Capillary: 117 mg/dL — ABNORMAL HIGH (ref 70–99)

## 2023-08-24 LAB — TROPONIN I (HIGH SENSITIVITY): Troponin I (High Sensitivity): 51 ng/L — ABNORMAL HIGH (ref <18)

## 2023-08-24 LAB — BODY FLUID CELL COUNT WITH DIFFERENTIAL
Eos, Fluid: 2 %
Lymphs, Fluid: 70 %
Monocyte-Macrophage-Serous Fluid: 5 % — ABNORMAL LOW (ref 50–90)
Neutrophil Count, Fluid: 23 % (ref 0–25)
Total Nucleated Cell Count, Fluid: 281 uL (ref 0–1000)

## 2023-08-24 LAB — MAGNESIUM: Magnesium: 2 mg/dL (ref 1.7–2.4)

## 2023-08-24 LAB — GLUCOSE, PLEURAL OR PERITONEAL FLUID: Glucose, Fluid: 118 mg/dL

## 2023-08-24 LAB — LACTATE DEHYDROGENASE, PLEURAL OR PERITONEAL FLUID: LD, Fluid: 43 U/L — ABNORMAL HIGH (ref 3–23)

## 2023-08-24 MED ORDER — TRIMETHOBENZAMIDE HCL 100 MG/ML IM SOLN: 200.0000 mg | Freq: Three times a day (TID) | INTRAMUSCULAR | Status: DC | PRN

## 2023-08-24 MED ORDER — LIDOCAINE HCL (PF) 1 % IJ SOLN
8.0000 mL | Freq: Once | INTRAMUSCULAR | Status: AC
  Administered 2023-08-24: 8 mL via INTRADERMAL

## 2023-08-24 MED ORDER — FUROSEMIDE 10 MG/ML IJ SOLN
40.0000 mg | Freq: Two times a day (BID) | INTRAMUSCULAR | Status: AC
  Administered 2023-08-24 (×2): 40 mg via INTRAVENOUS
  Filled 2023-08-24 (×2): qty 4

## 2023-08-24 MED ORDER — HYDRALAZINE HCL 10 MG PO TABS
10.0000 mg | ORAL_TABLET | Freq: Three times a day (TID) | ORAL | Status: DC
  Administered 2023-08-24 – 2023-08-25 (×6): 10 mg via ORAL
  Filled 2023-08-24 (×6): qty 1

## 2023-08-24 MED ORDER — POTASSIUM CHLORIDE CRYS ER 20 MEQ PO TBCR
20.0000 meq | EXTENDED_RELEASE_TABLET | Freq: Once | ORAL | Status: AC
  Administered 2023-08-24: 20 meq via ORAL
  Filled 2023-08-24: qty 1

## 2023-08-24 MED ORDER — ISOSORBIDE MONONITRATE ER 30 MG PO TB24
30.0000 mg | ORAL_TABLET | Freq: Every day | ORAL | Status: DC
  Administered 2023-08-24 – 2023-08-26 (×3): 30 mg via ORAL
  Filled 2023-08-24 (×3): qty 1

## 2023-08-24 NOTE — Progress Notes (Signed)
 OT Cancellation Note  Patient Details Name: Jeffery Fernandez MRN: [id number] DOB: Feb 11, 1945   Cancelled Treatment:    Reason Eval/Treat Not Completed: Medical issues which prohibited therapy. Pt awaiting ultrasound results for DVT. Will hold therapies until tomorrow.   Tannie Koskela C, OT  Acute Rehabilitation Services Office [phone] Secure chat preferred   Adrianne GORMAN Savers 08/24/2023, 2:48 PM

## 2023-08-24 NOTE — Progress Notes (Signed)
 VASCULAR LAB    Left lower extremity venous duplex has been performed.  See CV proc for preliminary results.   Rodrigus Kilker, RVT 08/24/2023, 1:15 PM

## 2023-08-24 NOTE — Progress Notes (Signed)
 PT Cancellation Note  Patient Details Name: KENYA SHIRAISHI MRN: [id number] DOB: 08-25-1945   Cancelled Treatment:    Reason Eval/Treat Not Completed: Medical issues which prohibited therapy remain this afternoon, pt has received ultrasound for LE DVT but still awaiting results. Will continue holding therapies until results available.   Izetta Call, PT, DPT   Acute Rehabilitation Department Office [phone] Secure Chat Communication Preferred    Izetta JULIANNA Call 08/24/2023, 2:52 PM

## 2023-08-24 NOTE — Plan of Care (Signed)
  Problem: Clinical Measurements: Goal: Ability to maintain clinical measurements within normal limits will improve 08/24/2023 1621 by Caretha Gibson, RN Outcome: Progressing 08/24/2023 1620 by Caretha Gibson, RN Outcome: Progressing Goal: Diagnostic test results will improve Outcome: Progressing Goal: Cardiovascular complication will be avoided Outcome: Progressing   Problem: Nutrition: Goal: Adequate nutrition will be maintained Outcome: Progressing

## 2023-08-24 NOTE — Procedures (Signed)
 PROCEDURE SUMMARY:  Successful image-guided right-sided thoracentesis. Yielded 2.25 liters of clear-straw-colored pleural fluid. Patient tolerated procedure well. EBL: Zero No immediate complications.  Specimen was sent for labs. Post procedure CXR is pending.  Please see imaging section of Epic for full dictation.  [place] LABOR Ralyn Stlaurent PA-C 08/24/2023 2:44 PM

## 2023-08-24 NOTE — Progress Notes (Addendum)
 HD#1 SUBJECTIVE:  Patient Summary: Jeffery Fernandez is a 78 y.o. 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    Overnight Events: Started on diuretics and afterload reduction per cards  Interim History: Evaluated at bedside with granddaughter at bedside. Reports nausea. Coughing up mucous mainly. Reports shortness of breath still. Appetite not well, ate a few bites.   Seen by cardiology who recommend Hydralazine  10mg  TID and Isosorbide  mononitrate 30 with 40 Lasix  BID with goal of net negative 1L  OBJECTIVE:  Vital Signs: Vitals:   08/24/23 0023 08/24/23 0024 08/24/23 0429 08/24/23 0718  BP:  (!) 143/94 (!) 160/92 (!) 162/96  Pulse: (!) 101 99 (!) 101 96  Resp: 20 19 20  (!) 28  Temp: 98.2 F (36.8 C) 97.6 F (36.4 C) 98.3 F (36.8 C) 98.7 F (37.1 C)  TempSrc:  Axillary Oral Oral  SpO2: 100% 99% 99% 99%  Weight:      Height:       Supplemental O2: Nasal Cannula SpO2: 99 % O2 Flow Rate (L/min): 2 L/min  Filed Weights   08/23/23 2012  Weight: 74.7 kg    No intake or output data in the 24 hours ending 08/24/23 0524 Net IO Since Admission: No IO data has been entered for this period [08/24/23 0524]  Physical Exam: Physical Exam Constitutional:      General: He is not in acute distress.    Appearance: He is not ill-appearing.  Cardiovascular:     Rate and Rhythm: Tachycardia present.  Pulmonary:     Effort: Tachypnea and accessory muscle usage present.     Breath sounds: Examination of the right-upper field reveals decreased breath sounds. Decreased breath sounds present.     Comments: Crackles at the bases Musculoskeletal:     Right lower leg: No edema.     Left lower leg: No edema.  Neurological:     Mental Status: He is alert.       Patient Lines/Drains/Airways Status     Active Line/Drains/Airways     Name Placement date Placement time Site Days   Implanted Port 02/19/23 Right Chest Single 02/19/23  1003  Chest  186   Peripheral IV 08/23/23 22 G Anterior;Right Forearm 08/23/23  1500  Forearm  1            Pertinent labs and imaging:   Latest Reference Range & Units 08/23/23 17:34 08/23/23 19:09  Lactic Acid, Venous 0.5 - 1.9 mmol/L  0.8  Procalcitonin ng/mL <0.10     Latest Reference Range & Units 08/23/23 17:34 08/23/23 20:48 08/24/23 03:37  Troponin I (High Sensitivity) <18 ng/L 48 (H) 52 (H) 51 (H)  (H): Data is abnormally high    Latest Ref Rng & Units 08/24/2023    3:37 AM 08/23/2023    1:13 PM 08/13/2023    1:31 PM  CBC  WBC 4.0 - 10.5 K/uL 7.1  7.0  6.7   Hemoglobin 13.0 - 17.0 g/dL 89.2  89.3  89.4   Hematocrit 39.0 - 52.0 % 33.0  33.7  31.5   Platelets 150 - 400 K/uL 182  191  164        Latest Ref Rng & Units 08/24/2023    3:37 AM 08/23/2023    1:13 PM 08/13/2023  1:31 PM  CMP  Glucose 70 - 99 mg/dL 51  98  870   BUN 8 - 23 mg/dL 23  22  22    Creatinine 0.61 - 1.24 mg/dL 7.63  7.62  7.88   Sodium 135 - 145 mmol/L 140  138  138   Potassium 3.5 - 5.1 mmol/L 3.9  3.9  3.8   Chloride 98 - 111 mmol/L 106  104  104   CO2 22 - 32 mmol/L 25  24  29    Calcium  8.9 - 10.3 mg/dL 8.6  8.6  8.4   Total Protein 6.5 - 8.1 g/dL   6.5   Total Bilirubin 0.0 - 1.2 mg/dL   0.4   Alkaline Phos 38 - 126 U/L   135   AST 15 - 41 U/L   18   ALT 0 - 44 U/L   13     ECHOCARDIOGRAM COMPLETE Result Date: 08/23/2023    ECHOCARDIOGRAM REPORT   Patient Name:   Jeffery Fernandez Date of Exam: 08/23/2023 Medical Rec #:  [id number]     Height:       70.0 in Accession #:    [id number]    Weight:       164.1 lb Date of Birth:  11-Jan-1946      BSA:          1.919 m Patient Age:    78 years      BP:           148/104 mmHg Patient Gender: M             HR:           110 bpm. Exam Location:  Inpatient Procedure: 2D Echo (Both  Spectral and Color Flow Doppler were utilized during            procedure). STAT ECHO Indications:    dyspnea  History:        Patient has prior history of Echocardiogram examinations, most                 recent 11/25/2022. CAD, COPD, Arrythmias:LBBB; Risk                 Factors:Diabetes, Sleep Apnea, Hypertension and Dyslipidemia.  Sonographer:    Tinnie Barefoot RDCS Referring Phys: [phone] ROBERT C PATERSON IMPRESSIONS  1. Left ventricular ejection fraction, by estimation, is <20%. The left ventricle has severely decreased function. The left ventricle demonstrates global hypokinesis. The left ventricular internal cavity size was dilated. Left ventricular diastolic function could not be evaluated.  2. Right ventricular systolic function is mildly reduced. The right ventricular size is normal. Tricuspid regurgitation signal is inadequate for assessing PA pressure.  3. Left atrial size was moderately dilated.  4. The mitral valve is degenerative. Moderate mitral valve regurgitation. No evidence of mitral stenosis.  5. The aortic valve is grossly normal. Aortic valve regurgitation is mild. Aortic valve sclerosis is present, with no evidence of aortic valve stenosis.  6. The inferior vena cava is normal in size with greater than 50% respiratory variability, suggesting right atrial pressure of 3 mmHg. Comparison(s): A prior study was performed on 11/25/2022. LVEF significantly reduced compared to prior study (previously reported 55-60%). Conclusion(s)/Recommendation(s): Results conveyed to ordering physician Dr. Jakie via secure chat (message reviewed). Also notified primary team (Dr. Zheng and Alyxander Kollmann) Dr. Elicia acknowledged the message. FINDINGS  Left Ventricle: Left ventricular ejection fraction, by estimation, is <20%. The left ventricle has severely decreased function.  The left ventricle demonstrates global hypokinesis. The left ventricular internal cavity size was dilated. There is no left ventricular  hypertrophy. Left ventricular diastolic function could not be evaluated due to nondiagnostic images. Left ventricular diastolic function could not be evaluated. Right Ventricle: The right ventricular size is normal. Right vetricular wall thickness was not well visualized. Right ventricular systolic function is mildly reduced. Tricuspid regurgitation signal is inadequate for assessing PA pressure. Left Atrium: Left atrial size was moderately dilated. Right Atrium: Right atrial size was normal in size. Pericardium: There is no evidence of pericardial effusion. Mitral Valve: The mitral valve is degenerative in appearance. Mild mitral annular calcification. Moderate mitral valve regurgitation. No evidence of mitral valve stenosis. Tricuspid Valve: The tricuspid valve is grossly normal. Tricuspid valve regurgitation is not demonstrated. No evidence of tricuspid stenosis. Aortic Valve: The aortic valve is grossly normal. Aortic valve regurgitation is mild. Aortic valve sclerosis is present, with no evidence of aortic valve stenosis. Pulmonic Valve: The pulmonic valve was not well visualized. Pulmonic valve regurgitation is not visualized. No evidence of pulmonic stenosis. Aorta: The aortic root is normal in size and structure and the ascending aorta was not well visualized. Venous: The inferior vena cava is normal in size with greater than 50% respiratory variability, suggesting right atrial pressure of 3 mmHg. IAS/Shunts: The interatrial septum was not well visualized.  LEFT VENTRICLE PLAX 2D LVIDd:         5.10 cm LVIDs:         5.00 cm LV PW:         1.10 cm LV IVS:        1.10 cm LVOT diam:     1.90 cm LV SV:         22 LV SV Index:   12 LVOT Area:     2.84 cm  RIGHT VENTRICLE            IVC RV Basal diam:  3.10 cm    IVC diam: 2.00 cm RV S prime:     9.68 cm/s TAPSE (M-mode): 1.5 cm LEFT ATRIUM           Index        RIGHT ATRIUM           Index LA diam:      3.70 cm 1.93 cm/m   RA Area:     14.00 cm LA Vol (A4C):  87.5 ml 45.60 ml/m  RA Volume:   34.50 ml  17.98 ml/m  AORTIC VALVE LVOT Vmax:   62.20 cm/s LVOT Vmean:  38.600 cm/s LVOT VTI:    0.078 m  AORTA Ao Root diam: 3.20 cm  SHUNTS Systemic VTI:  0.08 m Systemic Diam: 1.90 cm Sunit Tolia Electronically signed by Madonna Large Signature Date/Time: 08/23/2023/5:16:09 PM    Final    DG Chest 2 View Result Date: 08/23/2023 CLINICAL DATA:  Shortness of breath. EXAM: CHEST - 2 VIEW COMPARISON:  08/14/2023 FINDINGS: Stable enlarged cardiac silhouette. Left jugular catheter tip in the superior aspect of the right atrium, proximally 1 cm inferior to the superior cavoatrial junction. Increased airspace consolidation in the right lung, most pronounced in the outer upper lung zone. Clear left lung. Small bilateral pleural effusions. Mildly elevated right hemidiaphragm. Cervical spine fixation hardware. IMPRESSION: 1. Increased right lung airspace consolidation, most pronounced in the outer upper lung zone, consistent with pneumonia. 2. Mild right basilar atelectasis. 3. Small bilateral pleural effusions. 4. Stable cardiomegaly. Electronically Signed   By: Elspeth  Robynn M.D.   On: 08/23/2023 14:08    ASSESSMENT/PLAN:  Assessment: Principal Problem:   Acute on chronic hypoxic respiratory failure (HCC) Active Problems:   Hypertension   Coronary artery disease   Type 2 diabetes mellitus, with long-term current use of insulin  (HCC)   CKD (chronic kidney disease) stage 4, GFR 15-29 ml/min (HCC)  Jeffery Fernandez is a 78 y.o. 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 No sick symptoms. Chest x-ray showed worsening opacity of the right lung and pleural effusions. Differential includes pleural effusion, pneumonia, and PE. Has had recurrent pleural effusions with two thoracentesis in the last month per report, although there are only records for the thoracentesis on 7/17, performed at [facility], likely in the setting of his malignancy, but could be in the setting of his worsening heart failure, discussed below. I suspect that his pleural effusion has worsened and may need thoracentesis again. Pneumonia less likely. Procalcitonin negative. PE less likely as he has been taking Eliquis  5mg  BID recently. We will get a VQ scan to rule out PE. Wells score 4.    Plan: -ultrasound of right lung to evaluate for fluid pocket, may require thoracentesis today -wean O2 as tolerated -V/Q scan -low suspicion for infection, d/c antibiotics -40mg  IV Lasix  BID per cardiology -family would like to discuss further options with palliative care     #HFrEF (newly reduced EF of <20) #Elevated BNP #CAD  Previous echo 11/25/22 showed EF of 55-60%. Had a catheterization in 2012 with 60-70% occlusion in the RCA. Echo on 7/26 showed left ventricular ejection fraction of <20% with global hypokinesis with dilation. Right ventricular systolic function is mildly reduced. Left atrial dilation with mitral valve regurgitation and right atrial pressure of . EKG showed sinus rhythm although he had irregularity on exam. Cardiology consulted given worsening heart failure. Worsening EF could be due to chemotherapy toxicity vs CAD/ischemia. Lack of RV dysfunction further decreases suspicion for PE. BNP 2100. Troponin flat at 51. Lactic acid 0.8   Plan: -Cardiology consulted, recommend afterload reduction, not planning to pursue ischemic workup as he is not a good candidate - Hydralazine  10mg  TID, patient with listed intolerance to hydralazine  of  dizziness, suspect it was due to hypotension so we will start this medication and monitor effects. - Isosorbide  mononitrate 30mg  - Diuresis 40mg  BID - Strict Is and Os - Daily weights. -Atorvastatin  40mg    #CKDIV  Baseline around 2.5. Cr 2.37 on admission. Avoiding contrast from CTA to rule out PE -Avoid nephrotoxic meds -daily BMP   #DMII   #Hypoglycemia Family reports hypoglycemic event to 30 2 days ago. Home meds Trulicity  and Lantus  30 units. Last A1C 5.0 this month. Blood glucose 98 on admission. BG down to 46 last night despite not receiving any insulin .   Plan: -Hold all forms of insulin  in the setting of multiple hypoglycemic events -May have some adrenal insufficiency in the setting of chemoimmunotherapy, evaluate tomorrow if continues to be low   #Anemia Hgb on admission 10.6 around baseline of 10. Suspect anemia of chronic disease due to CKD and NSCLC. Hgb  today 10.7   #Depression/Anxiety  -Continue home medications of Ativan  0.5mg  at bedtime PRN, Zoloft  100mg , and Bupropion  150mg .   #GERD  -continue home Famotidine  20mg    #Hx of DVT  -Continue Eliquis  5mg     Best Practice: Diet: Regular diet VTE: Eliquis  5mg  BID Code: DNR/DNI  Disposition planning: Family Contact: wife and daughter DISPO: Anticipated discharge in 1-2 days to Home pending thoracentesis and improvement of respiratory status.  Signature:  Melvenia Napoleon [facility]  5:24 AM, 08/24/2023  On Call pager [phone]

## 2023-08-24 NOTE — Progress Notes (Addendum)
 Cardiology Progress Note  Patient ID: KEBIN MAYE MRN: [id number] DOB: 30-May-1945 Date of Encounter: 08/24/2023 Primary Cardiologist: Annabella Scarce, MD  Subjective   Chief Complaint: SOB  HPI: SOB reported.  Family reports he is currently with palliative care.  Cancer is not curable.  The patient is not interested in aggressive care.  ROS:  All other ROS reviewed and negative. Pertinent positives noted in the HPI.     Telemetry  Overnight telemetry shows SR 90-110 bpm, which I personally reviewed.   EKG: LBBB, sinus tachycardia   Physical Exam   Vitals:   08/24/23 0024 08/24/23 0429 08/24/23 0718 08/24/23 0900  BP: (!) 143/94 (!) 160/92 (!) 162/96 (!) 135/93  Pulse: 99 (!) 101 96 100  Resp: 19 20 (!) 28 20  Temp: 97.6 F (36.4 C) 98.3 F (36.8 C) 98.7 F (37.1 C)   TempSrc: Axillary Oral Oral Oral  SpO2: 99% 99% 99% 97%  Weight:      Height:       No intake or output data in the 24 hours ending 08/24/23 1011     08/23/2023    8:12 PM 08/13/2023    1:59 PM 07/28/2023   12:04 PM  Last 3 Weights  Weight (lbs) 164 lb 10.9 oz 164 lb 1.6 oz 158 lb  Weight (kg) 74.7 kg 74.435 kg 71.668 kg    Body mass index is 24.32 kg/m.  General: Well nourished, well developed, in no acute distress Head: Atraumatic, normal size  Eyes: PEERLA, EOMI  Neck: Supple, JVD 7 8 cmH2O Endocrine: No thryomegaly Cardiac: Normal S1, S2; RRR; no murmurs, rubs, or gallops Lungs: Diminished breath sounds bilaterally Abd: Soft, nontender, no hepatomegaly  Ext: No edema, pulses 2+ Musculoskeletal: No deformities, BUE and BLE strength normal and equal Skin: Warm and dry, no rashes   Neuro: Alert, awake, oriented to person and place  Cardiac Studies  TTE 08/23/2023  1. Left ventricular ejection fraction, by estimation, is <20%. The left  ventricle has severely decreased function. The left ventricle demonstrates  global hypokinesis. The left ventricular internal cavity size was dilated.  Left  ventricular diastolic  function could not be evaluated.   2. Right ventricular systolic function is mildly reduced. The right  ventricular size is normal. Tricuspid regurgitation signal is inadequate  for assessing PA pressure.   3. Left atrial size was moderately dilated.   4. The mitral valve is degenerative. Moderate mitral valve regurgitation.  No evidence of mitral stenosis.   5. The aortic valve is grossly normal. Aortic valve regurgitation is  mild. Aortic valve sclerosis is present, with no evidence of aortic valve  stenosis.   6. The inferior vena cava is normal in size with greater than 50%  respiratory variability, suggesting right atrial pressure of 3 mmHg.   Patient Profile  SAMUELL KNOBLE is a 78 y.o. 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  Assessment & Plan   # Acute systolic heart failure, EF less than 20% - Currently under palliative care for treatment of metastatic non-small cell lung cancer.  Not tolerating immunotherapy well per family.  Also has memory impairment and CKD stage IV.  The patient is not mobile and requires assistance with daily transfers and most activity. - Unclear etiology of new cardiomyopathy.  Could be related to malignancy or possibly CAD in the setting of longstanding diabetes. Possibly LBBB mediated.  -  Regardless, he is not a candidate for aggressive care.  The family reports that he has not done well with cancer treatment.  They are concerned about his decline. - My recommendation is hospice care.  We can continue with Lasix  40 mg IV twice daily to help with comfort as she certainly has elevated left-sided filling pressures but he does not appear grossly volume overloaded due to right heart failure.  His IVC is collapsing on echo. - Would give Lasix  40 mg twice daily today and reassess. - He has required thoracentesis in  the right long as well for comfort. - Regarding medical therapy we continue Imdur  30 mg daily and hydralazine  25 mg 3 times daily for afterload reduction.  Not a candidate for ACE/ARB/Arni/MRA.   - Given his sinus tachycardia and severe LV dysfunction would avoid any AV nodal agents.  The family is likely going to pursue hospice care.  # Metastatic non-small cell lung cancer # CKD stage IV # Memory impairment - Precludes aggressive CV care.  See discussion above.     For questions or updates, please contact [facility] Please consult [email or web address] for contact info under        Signed, Darryle T. Barbaraann, MD, [facility] [place]  [facility]  08/24/2023 10:11 AM

## 2023-08-25 ENCOUNTER — Encounter: Payer: Self-pay | Admitting: Internal Medicine

## 2023-08-25 ENCOUNTER — Inpatient Hospital Stay (HOSPITAL_COMMUNITY)

## 2023-08-25 DIAGNOSIS — Z515 Encounter for palliative care: Secondary | ICD-10-CM

## 2023-08-25 DIAGNOSIS — I824Z2 Acute embolism and thrombosis of unspecified deep veins of left distal lower extremity: Secondary | ICD-10-CM

## 2023-08-25 DIAGNOSIS — J9621 Acute and chronic respiratory failure with hypoxia: Secondary | ICD-10-CM | POA: Diagnosis not present

## 2023-08-25 DIAGNOSIS — I5021 Acute systolic (congestive) heart failure: Secondary | ICD-10-CM | POA: Diagnosis not present

## 2023-08-25 DIAGNOSIS — J189 Pneumonia, unspecified organism: Secondary | ICD-10-CM

## 2023-08-25 DIAGNOSIS — Z66 Do not resuscitate: Secondary | ICD-10-CM

## 2023-08-25 DIAGNOSIS — I5020 Unspecified systolic (congestive) heart failure: Secondary | ICD-10-CM

## 2023-08-25 DIAGNOSIS — Z7189 Other specified counseling: Secondary | ICD-10-CM

## 2023-08-25 LAB — GLUCOSE, CAPILLARY
Glucose-Capillary: 84 mg/dL (ref 70–99)
Glucose-Capillary: 148 mg/dL — ABNORMAL HIGH (ref 70–99)
Glucose-Capillary: 269 mg/dL — ABNORMAL HIGH (ref 70–99)
Glucose-Capillary: 152 mg/dL — ABNORMAL HIGH (ref 70–99)

## 2023-08-25 LAB — CBC
HCT: 31.3 % — ABNORMAL LOW (ref 39.0–52.0)
Hemoglobin: 10.2 g/dL — ABNORMAL LOW (ref 13.0–17.0)
MCH: 31 pg (ref 26.0–34.0)
MCHC: 32.6 g/dL (ref 30.0–36.0)
MCV: 95.1 fL (ref 80.0–100.0)
Platelets: 176 K/uL (ref 150–400)
RBC: 3.29 MIL/uL — ABNORMAL LOW (ref 4.22–5.81)
RDW: 16.6 % — ABNORMAL HIGH (ref 11.5–15.5)
WBC: 6.9 K/uL (ref 4.0–10.5)
nRBC: 0 % (ref 0.0–0.2)

## 2023-08-25 LAB — BASIC METABOLIC PANEL WITH GFR
Anion gap: 9 (ref 5–15)
BUN: 27 mg/dL — ABNORMAL HIGH (ref 8–23)
CO2: 26 mmol/L (ref 22–32)
Calcium: 8.5 mg/dL — ABNORMAL LOW (ref 8.9–10.3)
Chloride: 103 mmol/L (ref 98–111)
Creatinine, Ser: 2.71 mg/dL — ABNORMAL HIGH (ref 0.61–1.24)
GFR, Estimated: 23 mL/min — ABNORMAL LOW (ref >60)
Glucose, Bld: 98 mg/dL (ref 70–99)
Potassium: 3.6 mmol/L (ref 3.5–5.1)
Sodium: 138 mmol/L (ref 135–145)

## 2023-08-25 LAB — MAGNESIUM: Magnesium: 2 mg/dL (ref 1.7–2.4)

## 2023-08-25 LAB — PATHOLOGIST SMEAR REVIEW

## 2023-08-25 MED ORDER — FUROSEMIDE 40 MG PO TABS: 40.0000 mg | ORAL_TABLET | Freq: Two times a day (BID) | ORAL | Status: DC

## 2023-08-25 MED ORDER — FUROSEMIDE 10 MG/ML IJ SOLN
40.0000 mg | Freq: Once | INTRAMUSCULAR | Status: AC
  Administered 2023-08-25: 40 mg via INTRAVENOUS
  Filled 2023-08-25: qty 4

## 2023-08-25 MED ORDER — TECHNETIUM TO 99M ALBUMIN AGGREGATED
4.4000 | Freq: Once | INTRAVENOUS | Status: AC | PRN
  Administered 2023-08-25: 4.4 via INTRAVENOUS

## 2023-08-25 NOTE — Progress Notes (Signed)
 [facility] [phone] [facility] Liaison Note  Received request from TOC Kelli for hospice services at home after discharge. Spoke with patient and family at bedside to initiate education related to hospice philosophy, services and team approach to care. Patient and family verbalized understanding of information given. Per discussion, the plan is for discharge home by car after pleurx drain is placed.   DME needs discussed. Patient has the following equipment in the home: oxygen , walker, wheelchair and shower chair. Family requests the following equipment for delivery: none at this time.   Please send signed and completed DNR home with patient/family. Please provide prescriptions at discharge as needed to ensure ongoing symptom management.  [facility] information and contact numbers given to daughter Verneita. Please call with any concerns.  Thank you for the opportunity to participate in this patient's care.   Eleanor Nail, LPN [facility] Liaison [phone]

## 2023-08-25 NOTE — TOC Progression Note (Signed)
 Transition of Care [facility]) - Progression Note    Patient Details  Name: Jeffery Fernandez MRN: [id number] Date of Birth: 07-25-1945  [facility] CM/SW Contact  Andrez JULIANNA George, RN Phone Number: 08/25/2023, 3:29 PM  Clinical Narrative:     Family has decided on hospice at home. They asked to use [facility]. Information on the AVS. [facility] has received the referral and met with the family.  Currently family doesn't feel they need additional DME at the home.  Pt is to have pleurex cath placed prior to d/c home. Family to be educated on how to drain the cath as per ordered. [facility] will also follow up with the pleurex at home. CM will order the needed collection bottles prior to d/c home.  Pt is already active with [employer] for home oxygen . Family requesting orders be sent to [facility] as they have told the family they need new home oxygen  orders. CM will send this to [employer].  IP Care management following for further d/c needs.   Expected Discharge Plan: Home w Hospice Care Barriers to Discharge: Continued Medical Work up               Expected Discharge Plan and Services In-house Referral: Hospice / Palliative Care Discharge Planning Services: CM Consult Post Acute Care Choice: Hospice Living arrangements for the past 2 months: Single Family Home                                       Social Drivers of Health (SDOH) Interventions SDOH Screenings   Food Insecurity: No Food Insecurity (08/23/2023)  Housing: Low Risk  (08/23/2023)  Transportation Needs: No Transportation Needs (08/23/2023)  Utilities: Not At Risk (08/23/2023)  Depression (PHQ2-9): Medium Risk (01/09/2023)  Financial Resource Strain: Low Risk  (04/20/2023)   Received from [facility]  Physical Activity: Unknown (04/20/2023)   Received from [facility]  Social Connections: Moderately Integrated (08/23/2023)  Stress: Stress Concern Present (04/20/2023)   Received from [facility]  Tobacco  Use: Medium Risk (08/23/2023)    Readmission Risk Interventions    01/02/2023   12:27 PM  Readmission Risk Prevention Plan  Transportation Screening Complete  PCP or Specialist Appt within 5-7 Days Complete  Home Care Screening Complete  Medication Review (RN CM) Complete

## 2023-08-25 NOTE — Progress Notes (Addendum)
 HD#2 SUBJECTIVE:  Patient Summary: Jeffery Fernandez is a 78 y.o. person living with a pertinent PMH of metastatic NSCLC on chemotherapy, recurrent pleural effusions, HFpEF now with reduced EF, DMII (last A1c 5.0 08/20/23), depression, anxiety, CKD IV, HTN, HLD who presented with shortness of breath and admitted for acute on chronic respiratory failure.   Overnight Events: none   Interim History: Seen by radiology yesterday who performed thoracentesis. Able to drain 2.25L of clear straw fluid. Pending culture. US  DVT performed yesterday and found an age-indeterminate DVT of the left posterior tibial vein. Patient on chronic anticoagulation, Eliquis  5mg . Evaluated with daughter, Jeffery Fernandez, at bedside who assists with history. Reports overall feeling okay today. He notes increased urinary frequency, though denies pain with urinating. He states that his shortness of breath feels about the same even after thoracentesis. They continue to have ongoing discussion about goals of care.    OBJECTIVE:  Vital Signs: Vitals:   08/25/23 0511 08/25/23 0512 08/25/23 0513 08/25/23 0735  BP:  125/78  134/78  Pulse: (!) 143 (!) 58 (!) 42 94  Resp: 19 20 (!) 28 13  Temp:  (!) 97.1 F (36.2 C)  97.8 F (36.6 C)  TempSrc:    Oral  SpO2: 96% 94% 96% 100%  Weight:      Height:       Supplemental O2: Nasal Cannula SpO2: 100 % O2 Flow Rate (L/min): 3 L/min  Filed Weights   08/23/23 2012  Weight: 74.7 kg     Intake/Output Summary (Last 24 hours) at 08/25/2023 0934 Last data filed at 08/25/2023 0841 Gross per 24 hour  Intake 560 ml  Output --  Net 560 ml   Net IO Since Admission: 560 mL [08/25/23 0934]  Physical Exam: Physical Exam Constitutional:      General: He is not in acute distress. HENT:     Head: Normocephalic and atraumatic.  Cardiovascular:     Rate and Rhythm: Normal rate and regular rhythm.  Pulmonary:     Effort: Pulmonary effort is normal.     Breath sounds: Decreased breath sounds  present.  Skin:    General: Skin is warm and dry.  Neurological:     General: No focal deficit present.     Mental Status: He is alert and oriented to person, place, and time.  Psychiatric:        Mood and Affect: Mood normal.     Patient Lines/Drains/Airways Status     Active Line/Drains/Airways     Name Placement date Placement time Site Days   Implanted Port 02/19/23 Right Chest Single 02/19/23  1003  Chest  187   Peripheral IV 08/23/23 22 G Anterior;Right Forearm 08/23/23  1500  Forearm  2   External Urinary Catheter 08/24/23  2335  --  1            Pertinent labs and imaging:     Latest Ref Rng & Units 08/25/2023    3:00 AM 08/24/2023    3:37 AM 08/23/2023    1:13 PM  CBC  WBC 4.0 - 10.5 K/uL 6.9  7.1  7.0   Hemoglobin 13.0 - 17.0 g/dL 89.7  89.2  89.3   Hematocrit 39.0 - 52.0 % 31.3  33.0  33.7   Platelets 150 - 400 K/uL 176  182  191        Latest Ref Rng & Units 08/25/2023    3:00 AM 08/24/2023    1:44 PM 08/24/2023  3:37 AM  CMP  Glucose 70 - 99 mg/dL 98   51   BUN 8 - 23 mg/dL 27   23   Creatinine 9.38 - 1.24 mg/dL 7.28   7.63   Sodium 864 - 145 mmol/L 138   140   Potassium 3.5 - 5.1 mmol/L 3.6   3.9   Chloride 98 - 111 mmol/L 103   106   CO2 22 - 32 mmol/L 26   25   Calcium  8.9 - 10.3 mg/dL 8.5   8.6   Total Protein 6.5 - 8.1 g/dL  6.0    Total Bilirubin 0.0 - 1.2 mg/dL  0.4    Alkaline Phos 38 - 126 U/L  92    AST 15 - 41 U/L  17    ALT 0 - 44 U/L  12      VAS US  LOWER EXTREMITY VENOUS (DVT) (7a-7p) Result Date: 08/24/2023  Lower Venous DVT Study Patient Name:  Jeffery Fernandez  Date of Exam:   08/24/2023 Medical Rec #: [id number]      Accession #:    [id number] Date of Birth: [birth year]       Patient Gender: M Patient Age:   28 years Exam Location:  [facility] Procedure:      VAS US  LOWER EXTREMITY VENOUS (DVT) Referring Phys: WARREN BARRETT --------------------------------------------------------------------------------  Indications: Swelling,  and SOB.  Risk Factors: Lung cancer with mets to to bone on chemo/immunotherapy DVT 07/04/23 Age indeterminate DVT in one of the paired left posterior tibial veins. Anticoagulation: Eliquis . Comparison Study: Prior left LEV done 07/04/23 Performing Technologist: Alberta Lis RVS  Examination Guidelines: A complete evaluation includes B-mode imaging, spectral Doppler, color Doppler, and power Doppler as needed of all accessible portions of each vessel. Bilateral testing is considered an integral part of a complete examination. Limited examinations for reoccurring indications may be performed as noted. The reflux portion of the exam is performed with the patient in reverse Trendelenburg.  +-----+---------------+---------+-----------+----------+--------------+ RIGHTCompressibilityPhasicitySpontaneityPropertiesThrombus Aging +-----+---------------+---------+-----------+----------+--------------+ CFV  Full           Yes      No                                  +-----+---------------+---------+-----------+----------+--------------+ SFJ  Full                                                        +-----+---------------+---------+-----------+----------+--------------+   +---------+---------------+---------+-----------+----------+-----------------+ LEFT     CompressibilityPhasicitySpontaneityPropertiesThrombus Aging    +---------+---------------+---------+-----------+----------+-----------------+ CFV      Full           Yes      No                                     +---------+---------------+---------+-----------+----------+-----------------+ SFJ      Full                                                           +---------+---------------+---------+-----------+----------+-----------------+ FV Prox  Full           Yes      No                                     +---------+---------------+---------+-----------+----------+-----------------+ FV Mid   Full                                                            +---------+---------------+---------+-----------+----------+-----------------+ FV DistalFull                                                           +---------+---------------+---------+-----------+----------+-----------------+ PFV      Full           Yes      No                                     +---------+---------------+---------+-----------+----------+-----------------+ POP      Full           Yes      No                                     +---------+---------------+---------+-----------+----------+-----------------+ PTV      Partial                                      Age Indeterminate +---------+---------------+---------+-----------+----------+-----------------+ PERO     Full                                                           +---------+---------------+---------+-----------+----------+-----------------+ Gastroc  Full                                                           +---------+---------------+---------+-----------+----------+-----------------+    Summary: RIGHT: - No evidence of common femoral vein obstruction.   LEFT: - Findings consistent with age indeterminate deep vein thrombosis involving one of the paired left posterior tibial veins.  - Findings appear essentially unchanged compared to previous examination.  *See table(s) above for measurements and observations.    Preliminary    DG Chest 1 View Result Date: 08/24/2023 CLINICAL DATA:  Status post thoracentesis EXAM: CHEST  1 VIEW COMPARISON:  chest x-ray 08/23/2023 FINDINGS: Left-sided chest port catheter tip projects over the distal SVC. The heart and mediastinal contours are enlarged, unchanged. Right upper lobe airspace disease has mildly decreased. There are small pleural effusions, unchanged. No pneumothorax or acute fracture identified. Cervical spinal  fusion plate present. IMPRESSION: 1. No pneumothorax. 2. Mild decrease in right upper lobe  airspace disease. 3. Stable small pleural effusions. Electronically Signed   By: Greig Pique M.D.   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  Plan: #Acute on Chronic Respiratory Failure #NSCLC with metastasis to bone #Pleural Effusion Presented with increased O2 requirement over baseline 2L with desaturations to upper 80s. Thoracentesis done yesterday. 2.25 L of clear, straw-colored fluid drained. Pleural fluid protein 3.0, pleural fluid LDH 43. Pleural fluid gram stain shows rare WBC, but cultures negative for growth. Light's criteria consistent with transudative effusion, suggesting HF as the cause. V/Q scan shows no evidence of acute pulmonary embolism. Patient still short of breath despite thoracentesis. Blood pressure decreased this morning when standing from bed per family report, but no dizziness. Patient and family have the desire to pursue scheduled therapeutic thoracenteses. Palliative care and social work consulted to discuss patient's long-term medical management goals. Patient elected to use [facility] home hospice. Patient needs PleurX  catheter before discharge. TOC and case management aware and supporting patient's needs. - 40mg  IV Lasix  this morning, holding further with AKI  - Wean O2 from 3L to home 2L as tolerated.  #HFrEF (newly reduced EF of <20%) #Elevated BNP #CAD Previous echo 11/25/22 showed EF of  55-60%. Had catheterization in 2012 with 60-70% occlusion in the RCA. Echo on 7/26 showed left ventricular ejection fraction of < 20% with global hypokinesis with dilation. Right ventricular systolic function is mildly reduced. Left atrial dilation with mitral valve regurgitation and right atrial pressure of . Cardiology consulted given worsening heart failure, recommends afterload reduction as they are not planning to pursue ischemic workup as he is not a good candidate. Patient restarted on Hydralazine  10mg  TID. Blood pressure did decrease this morning when standing, but reported no dizziness. - Continue Hydralazine  10mg  TID. Will continue and monitor for effects. - Isosorbide  mononitrate 30mg  - Hold 40mg  Lasix  as above - Strict I&Os - Monitor daily weights - Atorvastatin  40mg   #CKD IV  Baseline around 2.5. Cr 2.37 on admission. Cr 2.71 today, likely in the setting of diuresis and diagnostic thoracentesis. -Avoid nephrotoxic meds -Daily BMP  #DMII #Hypoglycemia Family reports hypoglycemic events to 30 3 days ago. Home meds Trulicity  and Lantus  30 uints. Last A1c 5.0 this month. Blood glucose 98 on admission. Held all forms of insulin  in the setting of multiple hypoglycemic events. BG 84 this morning.  -Continue to hold insulin   #Anemia Hgb on admission 10.6 around baseline of 10. Suspect anemia of chronic disease due to CKD and NSCLC. Hgb today 10.2.  #Depression/anxiety - Continue home medications of Ativan  0.5mg   at bedtime PRN, Zoloft  100mg , and Bupropion  150mg .   #GERD - Continue home Famotidine  20mg   #Hx of DVT - Continue Eliquis  5mg  BID  Best Practice: Diet: Regular diet VTE: Eliquis  5mg   BID Code: DNR/DNI  Disposition planning: Family Contact: wife and daughter DISPO: Anticipated discharge today to Home pending improvement of respiratory status.  Signature:  Maclovia Uher, DO Internal Medicine Resident, PGY-1 Please contact the on call pager at [phone] for any urgent or emergent needs. 12:28 PM 08/25/2023

## 2023-08-25 NOTE — Evaluation (Addendum)
 Physical Therapy Evaluation Patient Details Name: Jeffery Fernandez MRN: [id number] DOB: 1945/09/26 Today's Date: 08/25/2023  History of Present Illness  78 y.o male admitted 08/23/23 for SOB with recurrent pleural effusion, and acute systolic heart failure EF<20%. 7/27 thoracentesis and age indeterminate LLE DVT. 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  Clinical Impression  Pt pleasant and reports urine incontinence with failure of condom cath with pt requiring assist for linen change and pericare. PT with supportive family and report his falls are when he tries to get up when they step out of the room with education for gait belt and safety. Pt with decreased strength, transfers and gait who will benefit from acute therapy to maximize mobility and independence. HHPT recommended.    Sitting 96/57 (65) HR 109 Standing 98/66 (76) HR 112 92% on 2L Max HR 125 with in room gait      If plan is discharge home, recommend the following: A little help with walking and/or transfers;A little help with bathing/dressing/bathroom;Assistance with cooking/housework;Direct supervision/assist for financial management;Supervision due to cognitive status;Assist for transportation   Can travel by private vehicle        Equipment Recommendations None recommended by PT  Recommendations for Other Services  OT consult    Functional Status Assessment Patient has had a recent decline in their functional status and/or demonstrates limited ability to make significant improvements in function in a reasonable and predictable amount of time     Precautions / Restrictions Precautions Precautions: Fall Recall of Precautions/Restrictions: Impaired Precaution/Restrictions Comments: 6-8 falls in the last 6 months, watch BP and SPO2      Mobility  Bed Mobility Overal bed mobility: Needs Assistance Bed Mobility:  Supine to Sit     Supine to sit: Supervision     General bed mobility comments: HOB flat, use of rail, increased time, assist for lines    Transfers Overall transfer level: Needs assistance   Transfers: Sit to/from Stand Sit to Stand: Min assist           General transfer comment: min assist to rise from bed and recliner with cues for hand placement and safety    Ambulation/Gait Ambulation/Gait assistance: Min assist Gait Distance (Feet): 20 Feet Assistive device: Rolling walker (2 wheels) Gait Pattern/deviations: Step-through pattern, Decreased stride length, Trunk flexed   Gait velocity interpretation: <1.8 ft/sec, indicate of risk for recurrent falls   General Gait Details: cues for proximity to RW and direction as pt used to rollator and needing instruction to turn RW. Initially walked 5' and reported RLE pain and fatigue requiring seated rest then additional trial of 20' on 2L with SPO2 92%  Stairs            Wheelchair Mobility     Tilt Bed    Modified Rankin (Stroke Patients Only)       Balance Overall balance assessment: Needs assistance, History of Falls Sitting-balance support: No upper extremity supported, Feet supported Sitting balance-Leahy Scale: Fair     Standing balance support: Bilateral upper extremity supported, During functional activity, Reliant on assistive device for balance Standing balance-Leahy Scale: Poor Standing balance comment: Rw in standing                             Pertinent Vitals/Pain Pain Assessment Pain Assessment: No/denies pain    Home  Living Family/patient expects to be discharged to:: (P) Private residence Living Arrangements: (P) Spouse/significant other;Children Available Help at Discharge: (P) Family;Available 24 hours/day Type of Home: (P) House         Home Layout: (P) One level Home Equipment: (P) Rolling Walker (2 wheels);Cane - single point;Grab bars - toilet;Shower seat;Grab bars  - tub/shower;Wheelchair - manual;BSC/3in1;Rollator (4 wheels);Tub bench      Prior Function Prior Level of Function : (P) Independent/Modified Independent;History of Falls (last six months)             Mobility Comments: (P) rollator for short distance, WC for community, at least 6 falls       Extremity/Trunk Assessment   Upper Extremity Assessment Upper Extremity Assessment: Generalized weakness    Lower Extremity Assessment Lower Extremity Assessment: Generalized weakness    Cervical / Trunk Assessment Cervical / Trunk Assessment: Kyphotic  Communication   Communication Communication: No apparent difficulties    Cognition Arousal: Alert Behavior During Therapy: WFL for tasks assessed/performed   PT - Cognitive impairments: Safety/Judgement, Problem solving                       PT - Cognition Comments: lying down allowing himself to be fed on arrival with wet sheets, cues to sit up and feed himself with assist for pericare and linen change Following commands: Intact       Cueing Cueing Techniques: Verbal cues     General Comments      Exercises     Assessment/Plan    PT Assessment Patient needs continued PT services  PT Problem List Decreased activity tolerance;Decreased balance;Decreased mobility;Decreased cognition;Decreased strength       PT Treatment Interventions DME instruction;Balance training;Functional mobility training;Gait training;Therapeutic activities;Therapeutic exercise;Patient/family education    PT Goals (Current goals can be found in the Care Plan section)  Acute Rehab PT Goals Patient Stated Goal: return home PT Goal Formulation: With patient/family Time For Goal Achievement: 09/08/23 Potential to Achieve Goals: Good    Frequency Min 2X/week     Co-evaluation               AM-PAC PT 6 Clicks Mobility  Outcome Measure Help needed turning from your back to your side while in a flat bed without using  bedrails?: A Little Help needed moving from lying on your back to sitting on the side of a flat bed without using bedrails?: A Little Help needed moving to and from a bed to a chair (including a wheelchair)?: A Little Help needed standing up from a chair using your arms (e.g., wheelchair or bedside chair)?: A Little Help needed to walk in hospital room?: A Little Help needed climbing 3-5 steps with a railing? : A Lot 6 Click Score: 17    End of Session Equipment Utilized During Treatment: Gait belt;Oxygen  Activity Tolerance: Patient tolerated treatment well Patient left: in chair;with call bell/phone within reach;with chair alarm set;with nursing/sitter in room Nurse Communication: Mobility status PT Visit Diagnosis: Other abnormalities of gait and mobility (R26.89);Difficulty in walking, not elsewhere classified (R26.2);Muscle weakness (generalized) (M62.81)    Time: 9258-9183 PT Time Calculation (min) (ACUTE ONLY): 35 min   Charges:   PT Evaluation $PT Eval Moderate Complexity: 1 Mod PT Treatments $Gait Training: 8-22 mins PT General Charges $$ ACUTE PT VISIT: 1 Visit         Jeffery Fernandez, PT Acute Rehabilitation Services Office: [phone]   Jeffery Fernandez 08/25/2023, 10:34 AM

## 2023-08-25 NOTE — Plan of Care (Signed)

## 2023-08-25 NOTE — Progress Notes (Signed)
 OT Cancellation Note  Patient Details Name: Jeffery Fernandez MRN: [id number] DOB: 03/08/1945   Cancelled Treatment:    Reason Eval/Treat Not Completed: Patient at procedure or test/ unavailable Attempted to see pt for OT eval. Pt currently off unit for procedure and unable to participate. Will follow up with pt at a later time when able to complete eval.   Leita Howell, OTR/L,CBIS  Supplemental OT - [facility] and [facility] Secure Chat Preferred   08/25/2023, 10:02 AM

## 2023-08-25 NOTE — TOC Initial Note (Addendum)
 Transition of Care [facility]) - Initial/Assessment Note    Patient Details  Name: Jeffery Fernandez MRN: [id number] Date of Birth: [birth year]  [facility]) CM/SW Contact:    Luise JAYSON Pan, LCSWA Phone Number: 08/25/2023, 10:52 AM  Clinical Narrative:     Patient is from home with spouse. Daughter reports she is with her parents a good majority of the time as well. Daughter stated patient has the following DME: walker, wheelchari, cane, home O2 through [employer], shower chair.  [facility] and [facility]. Per Verneita, daughter, she can provide transportation at time of discharge.   Per daily meeting with treatment team, Palliative will be consulted.   TOC will continue to follow.     Expected Discharge Plan: Home w Home Health Services Barriers to Discharge: Continued Medical Work up   Patient Goals and CMS Choice Patient states their goals for this hospitalization and ongoing recovery are:: To go home          Expected Discharge Plan and Services In-house Referral: Hospice / Palliative Care Discharge Planning Services: CM Consult   Living arrangements for the past 2 months: Single Family Home                                      Prior Living Arrangements/Services Living arrangements for the past 2 months: Single Family Home Lives with:: Self, Spouse (Daughter comes in and assists patient alot) Patient language and need for interpreter reviewed:: Yes Do you feel safe going back to the place where you live?: Yes      Need for Family Participation in Patient Care: Yes (Comment) Care giver support system in place?: Yes (comment) Current home services: DME (walker, wc, cane,) Criminal Activity/Legal Involvement Pertinent to Current Situation/Hospitalization: No - Comment as needed  Activities of Daily Living   ADL Screening (condition at time of admission) Independently performs  ADLs?: No Does the patient have a NEW difficulty with bathing/dressing/toileting/self-feeding that is expected to last >3 days?: Yes (Initiates electronic notice to provider for possible OT consult) Does the patient have a NEW difficulty with getting in/out of bed, walking, or climbing stairs that is expected to last >3 days?: Yes (Initiates electronic notice to provider for possible PT consult) Does the patient have a NEW difficulty with communication that is expected to last >3 days?: No Is the patient deaf or have difficulty hearing?: Yes Does the patient have difficulty seeing, even when wearing glasses/contacts?: No Does the patient have difficulty concentrating, remembering, or making decisions?: No  Permission Sought/Granted Permission sought to share information with : Facility Medical sales representative, Family Supports Permission granted to share information with : Yes, Verbal Permission Granted  Share Information with NAME: teresa matthews  Permission granted to share info w AGENCY: HH agencies  Permission granted to share info w Relationship: daughter  Permission granted to share info w Contact Information: [phone]  Emotional Assessment Appearance:: Appears stated age Attitude/Demeanor/Rapport: Unable to Assess Affect (typically observed): Unable to Assess Orientation: : Oriented to Self, Oriented to Place, Oriented to  Time, Oriented to Situation Alcohol / Substance Use: Not Applicable Psych Involvement: No (comment)  Admission diagnosis:  Hypoxia [R09.02] Elevated brain natriuretic peptide (BNP) level [R79.89] Pneumonia due to infectious organism, unspecified laterality, unspecified part of lung [J18.9] Acute on chronic hypoxic respiratory failure (HCC) [J96.21]  Patient Active Problem List   Diagnosis Date Noted   Acute on chronic hypoxic respiratory failure (HCC) 08/23/2023   DVT of lower extremity (deep venous thrombosis) (HCC) 07/24/2023   Encounter for  antineoplastic immunotherapy 05/13/2023   Lesion of femur 05/06/2023   LBBB (left bundle branch block) 04/25/2023   Primary adenocarcinoma of upper lobe of right lung (HCC) 01/28/2023   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) 01/14/2023   ILD (interstitial lung disease) (HCC) 01/07/2023   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) 01/07/2023   AMS (altered mental status) 01/01/2023   Muscle strain of knee, right, initial encounter 01/01/2023   Chronic hypoxemic respiratory failure (HCC) 01/01/2023   Altered mental status 12/31/2022   Right lower lobe lung mass 12/11/2022   Acute renal failure superimposed on stage 4 chronic kidney disease (HCC) 11/28/2022   Hypoxia 11/28/2022   Chronic heart failure with preserved ejection fraction (HCC) 11/26/2022   Acute hypoxic respiratory failure (HCC) 11/24/2022   Pain due to implanted hardware, sequela 08/26/2022   Port-A-Cath in place 08/22/2022   Elevated sed rate 07/17/2022   Decreased calculated GFR 07/17/2022   History of vertebroplasty (L3, L4, L5) 07/17/2022   History of lumbar spinal fusion (posterior L4-L5) 07/17/2022   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   DDD (degenerative disc disease), lumbosacral 07/17/2022   Lumbosacral facet arthropathy (L4-5, L5-S1) vs 07/17/2022   Lumbar facet joint pain 07/17/2022   Spondylosis without myelopathy or radiculopathy, lumbosacral region 07/17/2022   Wedge compression fracture of third lumbar vertebra, sequela 07/17/2022   Decreased range of motion of lumbar spine 07/17/2022   History of nephrolithiasis 07/17/2022   Spinal column pain 07/16/2022   Chronic pain syndrome 07/03/2022   Pharmacologic therapy 07/03/2022   Disorder of skeletal system 07/03/2022   Problems influencing health status 07/03/2022   Abnormal MRI, lumbar spine (04/29/2020 & 08/02/2022) 07/03/2022   Compression fracture of L4 lumbar vertebra, sequela 07/03/2022   Compression fracture of L3 lumbar  vertebra, sequela 07/03/2022   History of vertebral fracture (L3, L4, L5) 07/03/2022   Spondylitis (HCC) 07/02/2022   Chronic low back pain (1ry area of Pain) (Bilateral) (L>R) w/o sciatica 07/02/2022   Inflamed seborrheic keratosis 07/02/2022   Bilateral sacroiliitis (HCC) 07/02/2022   Delayed wound healing 07/02/2022   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) 04/16/2021   Optic disc atrophy, right 02/05/2021   CKD (chronic kidney disease) stage 4, GFR 15-29 ml/min (HCC) 01/10/2021   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   Type 2 diabetes mellitus, with long-term current use of insulin  (HCC) 07/12/2020   Dyslipidemia associated with type 2 diabetes mellitus (HCC) 07/12/2020   Major depressive disorder with single episode, in full remission (HCC) 07/12/2020   History of gout 07/12/2020   Postoperative wound infection 06/06/2020   Delayed surgical wound healing 05/30/2020   Spinal stenosis of lumbar region 05/09/2020   Branch retinal vein occlusion with macular edema of left eye 05/05/2019   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   Bunion, left 01/15/2019   Compression fracture of lumbar vertebra (HCC) 12/08/2017   White coat syndrome with diagnosis of hypertension 09/19/2017   Heme positive stool 04/23/2017   Cervical spondylosis with myelopathy and radiculopathy 06/26/2016   Spinal stenosis of cervical region 06/26/2016   Cervical spondylosis with myelopathy 05/10/2016   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   Hypertension associated with diabetes (HCC) 09/13/2015   Anemia in stage 4 chronic kidney disease (HCC) 06/09/2013   History of  colonic polyps 06/14/2011   Coronary artery disease 07/10/2010   OSA (obstructive sleep apnea) 06/10/2010   Hypertension 06/10/2010   COPD (chronic obstructive pulmonary disease) (HCC) 06/10/2010   Hyperlipidemia 06/10/2010   Retinal artery occlusion 06/10/2010   OSA on CPAP 06/10/2010   PCP:  Pura Lenis,  MD [phone] [place], [place] - [address] [place] [address] Phone: [phone] Fax: [phone]     Social Drivers of Health (SDOH) Social History: SDOH Screenings   Food Insecurity: No Food Insecurity (08/23/2023)  Housing: Low Risk  (08/23/2023)  Transportation Needs: No Transportation Needs (08/23/2023)  Utilities: Not At Risk (08/23/2023)  Depression (PHQ2-9): Medium Risk (01/09/2023)  Financial Resource Strain: Low Risk  (04/20/2023)   Received from [facility]  Physical Activity: Unknown (04/20/2023)   Received from [facility]  Social Connections: Moderately Integrated (08/23/2023)  Stress: Stress Concern Present (04/20/2023)   Received from [facility]  Tobacco Use: Medium Risk (08/23/2023)   SDOH Interventions:     Readmission Risk Interventions    01/02/2023   12:27 PM  Readmission Risk Prevention Plan  Transportation Screening Complete  PCP or Specialist Appt within 5-7 Days Complete  Home Care Screening Complete  Medication Review (RN CM) Complete

## 2023-08-25 NOTE — Progress Notes (Signed)
 Heart Failure Navigator Progress Note  Assessed for Heart & Vascular TOC clinic readiness.  Patient does not meet criteria due to per MD note patient with Metastatic Lung Cancer. No HF TOC. .   Navigator will sign off at this time.   Stephane Haddock, BSN, Scientist, clinical (histocompatibility and immunogenetics) Only

## 2023-08-25 NOTE — Consult Note (Cosign Needed Addendum)
 Palliative Care Consult Note                                  Date: 08/25/2023   Patient Name: Jeffery Fernandez  DOB: Jun 23, 1945  MRN: [id number]  Age / Sex: 78 y.o., male  PCP: Pura Lenis, MD Referring Physician: Karna Fellows, MD  Reason for Consultation: Disposition, Establishing goals of care, and Hospice Evaluation  HPI/Patient Profile: Palliative Care consult requested for goals of care discussion in this 78 y.o. male  with past medical history of metastatic NSCLC (12/2022), COPD, DM type 2, arthritis, hypertension, DVT (Eliquis ), and hyperlipidemia.  He was admitted on 08/23/2023 with worsening dyspnea and hypoxia. Work-up showed BNP >2000, elevated troponin, and chest x-ray showed bilateral pleural effusions.    Past Medical History:  Diagnosis Date   Acid reflux    Arrhythmia    Arthritis    Cataract    bilateral cataract extraction with intraoccular lens implants   Chest pain, unspecified    CHF (congestive heart failure) (HCC)    Chronic airway obstruction, not elsewhere classified    PT. DENIES HE HAS COPD   CKD (chronic kidney disease)    Colon polyps    Adenomatous Polyps 2007   Depression    Diabetes mellitus    Diverticulosis    Gout    Heart murmur    History of kidney stones    History of radiation therapy    Thoracic Spine- 01/27/23-02/10/23- Dr. Lynwood Nasuti   Hyperlipemia    Hypertension    Lung cancer (HCC) 12/2022   Nephrolithiasis    Normocytic anemia 04/23/2017   Obstructive sleep apnea (adult) (pediatric)    Other and unspecified hyperlipidemia    Other left bundle branch block    Retinal vascular occlusion, unspecified    right   Shortness of breath    on exertion   Unspecified sleep apnea    Vision loss of right eye    White coat syndrome with diagnosis of hypertension 09/19/2017   Subjective:   This NP Levon Freud reviewed medical records, received report from team,  and spoke at length with  patient and his daughter, Verneita Ku via phone to discuss diagnosis, prognosis, GOC, EOL wishes disposition and options.   Concept of Palliative Care was introduced as specialized medical care for people and their families living with serious illness.  It focuses on providing relief from the symptoms and stress of a serious illness.  The goal is to improve quality of life for both the patient and the family. Values and goals of care important to patient and family were attempted to be elicited. Of note patient is familiar to myself as he is closely followed at [facility].   I created space and opportunity for patient and family to explore state of health prior to admission, thoughts, and feelings. Verneita openly shares concerns of her father's ongoing signs of health decline and poor quality of life stating he does not have much! Emotional support provided.   We discussed His current illness and what it means in the larger context of His on-going co-morbidities. Natural disease trajectory and expectations were discussed.  Patient and family verbalized understanding of current illness and co-morbidities. He is experiencing heart failure and has been receiving Lasix , which has affected his kidney function. He experiences drops in blood pressure upon standing. He has been undergoing frequent thoracentesis procedures to  manage fluid buildup, with 2.2 liters removed yesterday, 1.7 liters on the 17th, and 1.6 liters the week before that.  Family is emotional expressing recent discussions with the medical team, specifically Cardiology and gaining an understanding of Mr. Nabor poor EF of less than 20% and recommendations to consider hospice services with understanding his prognosis is poor overall.   Although this information comes as a shocker to them they are understanding and are clear in expressed wishes to focus on treating the treatable while hospitalized with a goal of patient  returning home with his family for what time he has left. They have no desire for him to suffer and wish to manage symptoms as his comfort and quality of life is now most important to every one. Appropriate questions were asked and answered to the best of my ability.   Mr. Caiazzo has required frequent thoracentesis with removal of at minimum 1L with each procedure. Education provided on the insertion of a PleurX catheter if patient is a candidate. They are aware IR will evaluate and proceed with procedure if applicable. Patient and family aware that home maintenance will be required and catheter would be placed for symptom management needs and comfort. They verbalized understanding and wish to pursue further work-up and placement if possible.   I discussed the importance of continued conversation with family and their medical providers regarding overall plan of care and treatment options, ensuring decisions are within the context of the patients values and GOCs.  Questions and concerns were addressed. The patient and family was encouraged to call with questions or concerns.  PMT will continue to support holistically as needed.  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 [facility])  DVT of lower extremity (deep venous thrombosis) (HCC)   Scheduled Meds:  apixaban   5 mg Oral BID   atorvastatin   40 mg Oral QHS   buPROPion   150 mg Oral q morning   famotidine   20 mg Oral Daily   furosemide   40 mg Oral BID   hydrALAZINE   10 mg Oral TID   isosorbide  mononitrate  30 mg Oral Daily   sertraline   100 mg Oral q AM   Continuous Infusions:  PRN Meds: acetaminophen  **OR**  acetaminophen , LORazepam , senna-docusate, trimethobenzamide   Allergies  Allergen Reactions   Amlodipine  Swelling    Patient report facial swelling at high doses   Penicillins Other (See Comments)    05/06/23 tolerated cefazolin ;   Hydralazine  Other (See Comments)    Dizziness    Review of Systems  Constitutional:  Positive for activity change, appetite change and fatigue.  Cardiovascular:  Positive for leg swelling.  Neurological:  Positive for weakness.  Unless otherwise noted, a complete review of systems is negative.  Vital Signs:  BP 134/78 (BP Location: Left Arm)   Pulse 94   Temp 97.8 F (36.6 C) (Oral)   Resp 13   Ht 5' 9 (1.753 m)   Wt 164 lb 10.9 oz (74.7 kg)   SpO2 100%   BMI 24.32 kg/m  Pain Scale: 0-10   Pain Score: 0-No pain  SpO2: SpO2: 100 % O2 Device:SpO2: 100 % O2 Flow Rate: .O2 Flow Rate (L/min): 3 L/min  IO:  Intake/output summary:  Intake/Output Summary (Last 24 hours) at 08/25/2023 1120 Last data filed at 08/25/2023 0841 Gross per 24 hour  Intake 440 ml  Output --  Net 440 ml    LBM:   Baseline Weight: Weight: 164 lb 10.9 oz (74.7 kg) Most recent weight: Weight: 164 lb 10.9 oz (74.7 kg)      Palliative Assessment/Data:    Advanced Care Planning:   Primary Decision Maker: NEXT OF KIN  Code Status/Advance Care Planning: DNR  A discussion was had today regarding advanced directives. Concepts specific to code status, artifical feeding and hydration, continued IV antibiotics and rehospitalization was had.  The difference between a aggressive medical intervention path and a palliative comfort care path was discussed.   Hospice and Palliative Care services outpatient were explained and offered. Patient and family verbalized their understanding and awareness of both palliative and hospice's goals and philosophy of care.  Extensive goals of care discussions. Education provided on outpatient hospice support and what care would look like in the  home, symptom management, expectations at end-of-life, inpatient hospice unit if needed, and referral process. Family verbalized understanding and appreciation. They wish to manage his symptoms aggressively and minimizing any suffering as best possible.   Patient is confirmed DNR/DNI.   Assessment & Plan:   SUMMARY OF RECOMMENDATIONS    Heart failure Heart failure is managed with Lasix , causing significant diuresis. Concern exists regarding Lasix 's impact on renal function. Hospice care is recommended due to poor quality of life and chronic condition, with family agreement to focus on comfort and quality of life at home. - Continue Lasix  management while hospitalized. - Initiate hospice care referral for home management. TOC order placed.   Orthostatic hypotension Orthostatic hypotension occurs upon standing, likely related to heart failure and diuretic use, contributing to poor quality of life.  Pleural effusion Recurrent pleural effusions managed with thoracentesis, with recent procedures removing significant fluid volumes. A Plurix catheter is recommended for home management to reduce hospital visits. - Place PleurX catheter before discharge for home management of pleural effusions. - Provide PleurX catheter management training to family and coordinate with hospice for ongoing support.  Goals of Care  -DNR/DNI-as confirmed by family -Continue with current plan of care while hospitalized without escalation.  -IR to evaluate and place PleurX catheter for symptom management prior to discharging home if possible.  -Extensive goals of care discussion. Patient and daughter are clear in expressed wishes to focus on his comfort while treating the treatable during hospitalization. Goal is to return home and spend what time he has left amongst his family and friends with outpatient hospice support.  -Education provided on outpatient hospice. Per daughter [facility] hospice as choice if at all  possible given understanding of demographics served and availability of inpatient unit support if needed.  -PMT will continue to support and follow as needed. Please call team line with urgent unmet palliative needs.  Symptom Management:  Per Attending  Palliative Prophylaxis:  Aspiration, Bowel Regimen, Frequent Pain Assessment, Oral Care, and Turn Reposition  Additional Recommendations (Limitations, Scope, Preferences): Avoid Hospitalization, No Artificial Feeding, and DNR/DNI, treat the treatable while minimizing suffering and symptoms.   Psycho-social/Spiritual:  Desire for further Chaplaincy support: no Additional Recommendations: Education on Hospice  Prognosis:  Days-Weeks   Discharge Planning:  Home with Hospice family is requesting [facility] as choice.   Discussed with: TOC and Attending (Dr. Isobel) via secure chat.   Patient and daughter expressed understanding and was in agreement with this  plan.    Time Total: 75 min   Visit consisted of counseling and education dealing with the complex and emotionally intense issues of symptom management and palliative care in the setting of serious and potentially life-threatening illness.  Signed by:  Levon Borer, AGPCNP-BC Palliative Medicine TeamWL [facility]   Phone: [phone] Pager: [phone] Amion: GEANNIE Freud   Thank you for allowing the Palliative Medicine Team to assist in the care of this patient. Please utilize secure chat with additional questions, if there is no response within 30 minutes please call the above phone number. Palliative Medicine Team providers are available by phone from 7am to 5pm daily and can be reached through the team cell phone.  Should this patient require assistance outside of these hours, please call the patient's attending physician.  *Please note that this is a verbal dictation therefore any spelling or grammatical errors are due to the Dragon [employer] One system  interpretation.

## 2023-08-25 NOTE — Progress Notes (Signed)
 Consult for education on PleurX draining procedure received from Nurse Case Manager. Patient to have a PleurX placed today or tomorrow. I introduced myself to the family caregivers, patient's daughter and wife. I showed them the PleurX catheter and how it works. Patients' daughter downloaded the PleurX app and will watch the drainage video. I described some of the drainage procedure to them. When the PleurX is placed I will return to provide hands on/return demonstration education for the drainage procedure. After placement, the provider will need to fill out the PleurX prescription form and sign it and the Nurse Case Manager will need to fax it to the number on the form to initiate ongoing delivery of drainage bottles.

## 2023-08-25 NOTE — Progress Notes (Addendum)
 Rounding Note   Patient Name: Jeffery Fernandez Date of Encounter: 08/25/2023  [facility] Cardiologist: Annabella Scarce, MD   Subjective  Pt urinating well, feels better after thoracentesis yesterday.   Daughter at bedside asking about next steps.   Scheduled Meds:  apixaban   5 mg Oral BID   atorvastatin   40 mg Oral QHS   buPROPion   150 mg Oral q morning   famotidine   20 mg Oral Daily   hydrALAZINE   10 mg Oral TID   isosorbide  mononitrate  30 mg Oral Daily   sertraline   100 mg Oral q AM   Continuous Infusions:  PRN Meds: acetaminophen  **OR** acetaminophen , LORazepam , senna-docusate, trimethobenzamide    Vital Signs  Vitals:   08/25/23 0511 08/25/23 0512 08/25/23 0513 08/25/23 0735  BP:  125/78  134/78  Pulse: (!) 143 (!) 58 (!) 42 94  Resp: 19 20 (!) 28 13  Temp:  (!) 97.1 F (36.2 C)  97.8 F (36.6 C)  TempSrc:    Oral  SpO2: 96% 94% 96% 100%  Weight:      Height:        Intake/Output Summary (Last 24 hours) at 08/25/2023 0902 Last data filed at 08/25/2023 0841 Gross per 24 hour  Intake 560 ml  Output --  Net 560 ml      08/23/2023    8:12 PM 08/13/2023    1:59 PM 07/28/2023   12:04 PM  Last 3 Weights  Weight (lbs) 164 lb 10.9 oz 164 lb 1.6 oz 158 lb  Weight (kg) 74.7 kg 74.435 kg 71.668 kg      Telemetry  SR with PVCs in the 70s - Personally Reviewed  ECG   No new tracings - Personally Reviewed  Physical Exam  GEN: No acute distress.   Neck: No JVD Cardiac: irregular rhythm regular rate, PVC on exam Respiratory: Clear on left side, mild crackles in right GI: Soft, nontender, non-distended  MS: No edema; No deformity. Neuro:  Nonfocal  Psych: Normal affect   Labs High Sensitivity Troponin:   Recent Labs  Lab 08/23/23 1734 08/23/23 2048 08/24/23 0337  TROPONINIHS 48* 52* 51*     Chemistry Recent Labs  Lab 08/23/23 1313 08/24/23 0337 08/24/23 1344 08/25/23 0300  NA 138 140  --  138  K 3.9 3.9  --  3.6  CL 104 106  --   103  CO2 24 25  --  26  GLUCOSE 98 51*  --  98  BUN 22 23  --  27*  CREATININE 2.37* 2.36*  --  2.71*  CALCIUM  8.6* 8.6*  --  8.5*  MG  --  2.0  --  2.0  PROT  --   --  6.0*  --   ALBUMIN   --   --  2.1*  --   AST  --   --  17  --   ALT  --   --  12  --   ALKPHOS  --   --  92  --   BILITOT  --   --  0.4  --   GFRNONAA 27* 27*  --  23*  ANIONGAP 10 9  --  9    Lipids No results for input(s): CHOL, TRIG, HDL, LABVLDL, LDLCALC, CHOLHDL in the last 168 hours.  Hematology Recent Labs  Lab 08/23/23 1313 08/24/23 0337 08/25/23 0300  WBC 7.0 7.1 6.9  RBC 3.44* 3.46* 3.29*  HGB 10.6* 10.7* 10.2*  HCT 33.7* 33.0* 31.3*  MCV 98.0 95.4 95.1  MCH 30.8 30.9 31.0  MCHC 31.5 32.4 32.6  RDW 16.8* 16.7* 16.6*  PLT 191 182 176   Thyroid  No results for input(s): TSH, FREET4 in the last 168 hours.  BNP Recent Labs  Lab 08/23/23 1313  BNP 2,109.4*    DDimer No results for input(s): DDIMER in the last 168 hours.   Radiology  VAS US  LOWER EXTREMITY VENOUS (DVT) (7a-7p) Result Date: 08/24/2023  Lower Venous DVT Study Patient Name:  SCOUT GUMBS  Date of Exam:   08/24/2023 Medical Rec #: [id number]      Accession #:    [id number] Date of Birth: [birth year]       Patient Gender: M Patient Age:   78 years Exam Location:  [facility] Procedure:      VAS US  LOWER EXTREMITY VENOUS (DVT) Referring Phys: WARREN BARRETT --------------------------------------------------------------------------------  Indications: Swelling, and SOB.  Risk Factors: Lung cancer with mets to to bone on chemo/immunotherapy DVT 07/04/23 Age indeterminate DVT in one of the paired left posterior tibial veins. Anticoagulation: Eliquis . Comparison Study: Prior left LEV done 07/04/23 Performing Technologist: Alberta Lis RVS  Examination Guidelines: A complete evaluation includes B-mode imaging, spectral Doppler, color Doppler, and power Doppler as needed of all accessible portions of each vessel. Bilateral testing  is considered an integral part of a complete examination. Limited examinations for reoccurring indications may be performed as noted. The reflux portion of the exam is performed with the patient in reverse Trendelenburg.  +-----+---------------+---------+-----------+----------+--------------+ RIGHTCompressibilityPhasicitySpontaneityPropertiesThrombus Aging +-----+---------------+---------+-----------+----------+--------------+ CFV  Full           Yes      No                                  +-----+---------------+---------+-----------+----------+--------------+ SFJ  Full                                                        +-----+---------------+---------+-----------+----------+--------------+   +---------+---------------+---------+-----------+----------+-----------------+ LEFT     CompressibilityPhasicitySpontaneityPropertiesThrombus Aging    +---------+---------------+---------+-----------+----------+-----------------+ CFV      Full           Yes      No                                     +---------+---------------+---------+-----------+----------+-----------------+ SFJ      Full                                                           +---------+---------------+---------+-----------+----------+-----------------+ FV Prox  Full           Yes      No                                     +---------+---------------+---------+-----------+----------+-----------------+ FV Mid   Full                                                           +---------+---------------+---------+-----------+----------+-----------------+  FV DistalFull                                                           +---------+---------------+---------+-----------+----------+-----------------+ PFV      Full           Yes      No                                     +---------+---------------+---------+-----------+----------+-----------------+ POP      Full           Yes       No                                     +---------+---------------+---------+-----------+----------+-----------------+ PTV      Partial                                      Age Indeterminate +---------+---------------+---------+-----------+----------+-----------------+ PERO     Full                                                           +---------+---------------+---------+-----------+----------+-----------------+ Gastroc  Full                                                           +---------+---------------+---------+-----------+----------+-----------------+    Summary: RIGHT: - No evidence of common femoral vein obstruction.   LEFT: - Findings consistent with age indeterminate deep vein thrombosis involving one of the paired left posterior tibial veins.  - Findings appear essentially unchanged compared to previous examination.  *See table(s) above for measurements and observations.    Preliminary    DG Chest 1 View Result Date: 08/24/2023 CLINICAL DATA:  Status post thoracentesis EXAM: CHEST  1 VIEW COMPARISON:  chest x-ray 08/23/2023 FINDINGS: Left-sided chest port catheter tip projects over the distal SVC. The heart and mediastinal contours are enlarged, unchanged. Right upper lobe airspace disease has mildly decreased. There are small pleural effusions, unchanged. No pneumothorax or acute fracture identified. Cervical spinal fusion plate present. IMPRESSION: 1. No pneumothorax. 2. Mild decrease in right upper lobe airspace disease. 3. Stable small pleural effusions. Electronically Signed   By: Greig Pique M.D.   On: 08/24/2023 15:34   ECHOCARDIOGRAM COMPLETE Result Date: 08/23/2023    ECHOCARDIOGRAM REPORT   Patient Name:   ESHAN TRUPIANO Date of Exam: 08/23/2023 Medical Rec #:  [id number]     Height:       70.0 in Accession #:    [id number]    Weight:       164.1 lb Date of Birth:  September 20, 1945      BSA:          1.919 m Patient  Age:    78 years      BP:           148/104 mmHg  Patient Gender: M             HR:           110 bpm. Exam Location:  Inpatient Procedure: 2D Echo (Both Spectral and Color Flow Doppler were utilized during            procedure). STAT ECHO Indications:    dyspnea  History:        Patient has prior history of Echocardiogram examinations, most                 recent 11/25/2022. CAD, COPD, Arrythmias:LBBB; Risk                 Factors:Diabetes, Sleep Apnea, Hypertension and Dyslipidemia.  Sonographer:    Tinnie Barefoot RDCS Referring Phys: [phone] ROBERT C PATERSON IMPRESSIONS  1. Left ventricular ejection fraction, by estimation, is <20%. The left ventricle has severely decreased function. The left ventricle demonstrates global hypokinesis. The left ventricular internal cavity size was dilated. Left ventricular diastolic function could not be evaluated.  2. Right ventricular systolic function is mildly reduced. The right ventricular size is normal. Tricuspid regurgitation signal is inadequate for assessing PA pressure.  3. Left atrial size was moderately dilated.  4. The mitral valve is degenerative. Moderate mitral valve regurgitation. No evidence of mitral stenosis.  5. The aortic valve is grossly normal. Aortic valve regurgitation is mild. Aortic valve sclerosis is present, with no evidence of aortic valve stenosis.  6. The inferior vena cava is normal in size with greater than 50% respiratory variability, suggesting right atrial pressure of 3 mmHg. Comparison(s): A prior study was performed on 11/25/2022. LVEF significantly reduced compared to prior study (previously reported 55-60%). Conclusion(s)/Recommendation(s): Results conveyed to ordering physician Dr. Jakie via secure chat (message reviewed). Also notified primary team (Dr. Zheng and Smucker) Dr. Elicia acknowledged the message. FINDINGS  Left Ventricle: Left ventricular ejection fraction, by estimation, is <20%. The left ventricle has severely decreased function. The left ventricle demonstrates  global hypokinesis. The left ventricular internal cavity size was dilated. There is no left ventricular hypertrophy. Left ventricular diastolic function could not be evaluated due to nondiagnostic images. Left ventricular diastolic function could not be evaluated. Right Ventricle: The right ventricular size is normal. Right vetricular wall thickness was not well visualized. Right ventricular systolic function is mildly reduced. Tricuspid regurgitation signal is inadequate for assessing PA pressure. Left Atrium: Left atrial size was moderately dilated. Right Atrium: Right atrial size was normal in size. Pericardium: There is no evidence of pericardial effusion. Mitral Valve: The mitral valve is degenerative in appearance. Mild mitral annular calcification. Moderate mitral valve regurgitation. No evidence of mitral valve stenosis. Tricuspid Valve: The tricuspid valve is grossly normal. Tricuspid valve regurgitation is not demonstrated. No evidence of tricuspid stenosis. Aortic Valve: The aortic valve is grossly normal. Aortic valve regurgitation is mild. Aortic valve sclerosis is present, with no evidence of aortic valve stenosis. Pulmonic Valve: The pulmonic valve was not well visualized. Pulmonic valve regurgitation is not visualized. No evidence of pulmonic stenosis. Aorta: The aortic root is normal in size and structure and the ascending aorta was not well visualized. Venous: The inferior vena cava is normal in size with greater than 50% respiratory variability, suggesting right atrial pressure of 3 mmHg. IAS/Shunts: The interatrial septum was not well visualized.  LEFT VENTRICLE PLAX 2D  LVIDd:         5.10 cm LVIDs:         5.00 cm LV PW:         1.10 cm LV IVS:        1.10 cm LVOT diam:     1.90 cm LV SV:         22 LV SV Index:   12 LVOT Area:     2.84 cm  RIGHT VENTRICLE            IVC RV Basal diam:  3.10 cm    IVC diam: 2.00 cm RV S prime:     9.68 cm/s TAPSE (M-mode): 1.5 cm LEFT ATRIUM           Index         RIGHT ATRIUM           Index LA diam:      3.70 cm 1.93 cm/m   RA Area:     14.00 cm LA Vol (A4C): 87.5 ml 45.60 ml/m  RA Volume:   34.50 ml  17.98 ml/m  AORTIC VALVE LVOT Vmax:   62.20 cm/s LVOT Vmean:  38.600 cm/s LVOT VTI:    0.078 m  AORTA Ao Root diam: 3.20 cm  SHUNTS Systemic VTI:  0.08 m Systemic Diam: 1.90 cm Sunit Tolia Electronically signed by Madonna Large Signature Date/Time: 08/23/2023/5:16:09 PM    Final    DG Chest 2 View Result Date: 08/23/2023 CLINICAL DATA:  Shortness of breath. EXAM: CHEST - 2 VIEW COMPARISON:  08/14/2023 FINDINGS: Stable enlarged cardiac silhouette. Left jugular catheter tip in the superior aspect of the right atrium, proximally 1 cm inferior to the superior cavoatrial junction. Increased airspace consolidation in the right lung, most pronounced in the outer upper lung zone. Clear left lung. Small bilateral pleural effusions. Mildly elevated right hemidiaphragm. Cervical spine fixation hardware. IMPRESSION: 1. Increased right lung airspace consolidation, most pronounced in the outer upper lung zone, consistent with pneumonia. 2. Mild right basilar atelectasis. 3. Small bilateral pleural effusions. 4. Stable cardiomegaly. Electronically Signed   By: Elspeth Bathe M.D.   On: 08/23/2023 14:08    Cardiac Studies  TTE 08/23/2023  1. Left ventricular ejection fraction, by estimation, is <20%. The left  ventricle has severely decreased function. The left ventricle demonstrates  global hypokinesis. The left ventricular internal cavity size was dilated.  Left ventricular diastolic  function could not be evaluated.   2. Right ventricular systolic function is mildly reduced. The right  ventricular size is normal. Tricuspid regurgitation signal is inadequate  for assessing PA pressure.   3. Left atrial size was moderately dilated.   4. The mitral valve is degenerative. Moderate mitral valve regurgitation.  No evidence of mitral stenosis.   5. The aortic valve is grossly  normal. Aortic valve regurgitation is  mild. Aortic valve sclerosis is present, with no evidence of aortic valve  stenosis.   6. The inferior vena cava is normal in size with greater than 50%  respiratory variability, suggesting right atrial pressure of 3 mmHg.     Patient Profile    78 y.o. 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   Assessment & Plan   Acute systolic heart failure with mild RV failure, moderate MR CKD IV Metastatic NSCLC Palliative care - LVEF < 20% - etiology not entirely clear with LBBB +/- ischemia vs  CAD - recommendation for hospice - receiving 40 mg IV lasix  BID for comfort - also required right thoracentesis for symptom management yesterday with 2.25 L drained - will avoid aggressive measures - continue GDMT with 30 mg imdur  and 10 mg hydralazine  TID - BP normotensive this morning - transition to PO lasix  today - will continue at 40 mg PO BID - sCr 271 with K 3.6 today   Hx of DVT Chronically anticoagulated with eliquis  - primary team has ordered VQ scan   Recurrent pleural effusion - has required thoracenteses, last one yesterday  - may benefit from pleur-x catheter   Recommend GOC discussion with palliative care inpatient. Daughter at bedside asking about next steps. She has reached out to oncology for guidance.     For questions or updates, please contact [facility] Please consult [email or web address] for contact info under     Signed, Jon Nat Hails, PA  08/25/2023, 9:02 AM    Personally seen and examined. Agree with above.  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  Echo EF less than 20% - Under care for metastatic small cell lung  cancer and not tolerating immunotherapy well. - Not a candidate for aggressive care such as cardiac catheterizations. - Agree with hospice recommendation. - 40 mg of Lasix  twice a day for comfort, currently on hold.  2.71 creatinine today up from 2.36 -Has required thoracentesis, 2 L, right lung, comfort. -Has had orthostatic type symptoms previously at home.  This worries his daughter.  History of DVT chronically anticoagulated with Eliquis   Appreciate palliative care team discussion.  Had lengthy discussion this morning.  Spoke with he and his daughter.  Would benefit from palliative care team discussion today.  DNR   [name], MD

## 2023-08-26 ENCOUNTER — Inpatient Hospital Stay (HOSPITAL_COMMUNITY)

## 2023-08-26 LAB — GLUCOSE, CAPILLARY
Glucose-Capillary: 115 mg/dL — ABNORMAL HIGH (ref 70–99)
Glucose-Capillary: 112 mg/dL — ABNORMAL HIGH (ref 70–99)
Glucose-Capillary: 255 mg/dL — ABNORMAL HIGH (ref 70–99)
Glucose-Capillary: 166 mg/dL — ABNORMAL HIGH (ref 70–99)

## 2023-08-26 LAB — BASIC METABOLIC PANEL WITH GFR
Anion gap: 9 (ref 5–15)
BUN: 30 mg/dL — ABNORMAL HIGH (ref 8–23)
CO2: 26 mmol/L (ref 22–32)
Calcium: 8.2 mg/dL — ABNORMAL LOW (ref 8.9–10.3)
Chloride: 101 mmol/L (ref 98–111)
Creatinine, Ser: 2.96 mg/dL — ABNORMAL HIGH (ref 0.61–1.24)
GFR, Estimated: 21 mL/min — ABNORMAL LOW (ref >60)
Glucose, Bld: 125 mg/dL — ABNORMAL HIGH (ref 70–99)
Potassium: 3.4 mmol/L — ABNORMAL LOW (ref 3.5–5.1)
Sodium: 136 mmol/L (ref 135–145)

## 2023-08-26 LAB — CYTOLOGY - NON PAP

## 2023-08-26 MED ORDER — VANCOMYCIN HCL IN DEXTROSE 1-5 GM/200ML-% IV SOLN
INTRAVENOUS | Status: AC
  Filled 2023-08-26: qty 200

## 2023-08-26 MED ORDER — APIXABAN 2.5 MG PO TABS
2.5000 mg | ORAL_TABLET | Freq: Two times a day (BID) | ORAL | Status: DC
  Administered 2023-08-26 – 2023-08-27 (×2): 2.5 mg via ORAL
  Filled 2023-08-26 (×2): qty 1

## 2023-08-26 MED ORDER — FENTANYL CITRATE (PF) 100 MCG/2ML IJ SOLN
INTRAMUSCULAR | Status: AC
Start: 2023-08-26 — End: 2023-08-26
  Filled 2023-08-26: qty 2

## 2023-08-26 MED ORDER — MIDAZOLAM HCL 2 MG/2ML IJ SOLN
INTRAMUSCULAR | Status: AC
  Filled 2023-08-26: qty 2

## 2023-08-26 MED ORDER — SODIUM CHLORIDE 0.9% FLUSH
10.0000 mL | Freq: Two times a day (BID) | INTRAVENOUS | Status: DC
  Administered 2023-08-26 – 2023-08-27 (×3): 10 mL

## 2023-08-26 MED ORDER — SODIUM CHLORIDE 0.9% FLUSH: 10.0000 mL | INTRAVENOUS | Status: DC | PRN

## 2023-08-26 MED ORDER — CHLORHEXIDINE GLUCONATE CLOTH 2 % EX PADS
6.0000 | MEDICATED_PAD | Freq: Every day | CUTANEOUS | Status: DC
  Administered 2023-08-26 – 2023-08-27 (×2): 6 via TOPICAL

## 2023-08-26 NOTE — Plan of Care (Signed)
  Problem: Clinical Measurements: Goal: Will remain free from infection Outcome: Progressing Goal: Respiratory complications will improve Outcome: Progressing   Problem: Pain Managment: Goal: General experience of comfort will improve and/or be controlled Outcome: Progressing   Problem: Skin Integrity: Goal: Risk for impaired skin integrity will decrease Outcome: Not Progressing

## 2023-08-26 NOTE — TOC Progression Note (Addendum)
 Transition of Care [facility]) - Progression Note    Patient Details  Name: Jeffery Fernandez MRN: [id number] Date of Birth: 09/20/1945  Transition of Care [facility] CM/SW Contact  Waddell Barnie Rama, RN Phone Number: 08/26/2023, 12:57 PM  Clinical Narrative:    NCM spoke with Eleanor with [facility], she states they will take care of the pluerx drains after they received the box of 10 from the hospital.  The box of 10 is in the patient's room.   Expected Discharge Plan: Home w Hospice Care Barriers to Discharge: Continued Medical Work up               Expected Discharge Plan and Services In-house Referral: Hospice / Palliative Care Discharge Planning Services: CM Consult Post Acute Care Choice: Hospice Living arrangements for the past 2 months: Single Family Home                                       Social Drivers of Health (SDOH) Interventions SDOH Screenings   Food Insecurity: No Food Insecurity (08/23/2023)  Housing: Low Risk  (08/23/2023)  Transportation Needs: No Transportation Needs (08/23/2023)  Utilities: Not At Risk (08/23/2023)  Depression (PHQ2-9): Medium Risk (01/09/2023)  Financial Resource Strain: Low Risk  (04/20/2023)   Received from [facility]  Physical Activity: Unknown (04/20/2023)   Received from [facility]  Social Connections: Moderately Integrated (08/23/2023)  Stress: Stress Concern Present (04/20/2023)   Received from [facility]  Tobacco Use: Medium Risk (08/23/2023)    Readmission Risk Interventions    01/02/2023   12:27 PM  Readmission Risk Prevention Plan  Transportation Screening Complete  PCP or Specialist Appt within 5-7 Days Complete  Home Care Screening Complete  Medication Review (RN CM) Complete

## 2023-08-26 NOTE — Sedation Documentation (Signed)
 Called 3E and let RN know that we were unable to proceed with procedure.  Dr. Johann looked with ultrasound and did not see fluid to place the pleurx catheter at this time.  Patient understanding and reports no pain and just wanting to eat and drink.  Patient being sent back up to his inpatient room.

## 2023-08-26 NOTE — Progress Notes (Signed)
 Appreciate palliative care//hospice. No new cardiology recommendations. We will go ahead and sign off.  Please let us  know if we can be of further assistance.  Getting Pleurx catheter today secondary to recurrent pleural effusion.  He and daughter Verneita are very appreciative.  [name], MD

## 2023-08-26 NOTE — Progress Notes (Signed)
 Physical Therapy Treatment Patient Details Name: Jeffery Fernandez MRN: [id number] DOB: 11/08/1945 Today's Date: 08/26/2023   History of Present Illness 78 y.o male admitted 08/23/23 for SOB with recurrent pleural effusion, and acute systolic heart failure EF<20%. 7/27 thoracentesis and age indeterminate LLE DVT. 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    PT Comments  Pt seen at daughter's request for continued mobility practice and recommendations in preparation for anticipated d/c home. The pt was agreeable as well, able to progress ambulation distance with VSS, but does demo poor stability even with BUE support needing minA to steady. Safety and balance are further compromised by impaired cognition, awareness, and memory as pt needing repeated cues for use of DME and technique. Recommended use of gait belt and sensors for home that alert daughter when pt getting out of bed or chair. Both verbalized understanding, no further DME needs prior to anticipated return home with hospice care.    VITALS:  - supine in bed - BP: 130/91 (106); HR: 106bpm - sitting EOB - BP: 128/53 (72); HR: 98bpm - sitting post-ambulation - BP: 121/92 (102); HR: 100bpm  HR max 145bpm before mobility at rest   If plan is discharge home, recommend the following: A little help with walking and/or transfers;A little help with bathing/dressing/bathroom;Assistance with cooking/housework;Direct supervision/assist for financial management;Supervision due to cognitive status;Assist for transportation   Can travel by private vehicle        Equipment Recommendations  None recommended by PT    Recommendations for Other Services       Precautions / Restrictions Precautions Precautions: Fall Recall of Precautions/Restrictions: Impaired Precaution/Restrictions Comments: 6-8 falls in the last 6 months, watch BP and  SPO2 Restrictions Weight Bearing Restrictions Per Provider Order: No     Mobility  Bed Mobility Overal bed mobility: Needs Assistance Bed Mobility: Supine to Sit, Sit to Supine     Supine to sit: Supervision, Used rails Sit to supine: Supervision   General bed mobility comments: increased time and use of rail    Transfers Overall transfer level: Needs assistance Equipment used: Rolling walker (2 wheels) Transfers: Sit to/from Stand, Bed to chair/wheelchair/BSC Sit to Stand: Min assist   Step pivot transfers: Min assist       General transfer comment: min assist to rise from bed and recliner with cues for hand placement and safety. minA to manage RW with turning    Ambulation/Gait Ambulation/Gait assistance: Min assist Gait Distance (Feet): 55 Feet (+ 75 ft) Assistive device: Rolling walker (2 wheels) Gait Pattern/deviations: Step-through pattern, Decreased stride length, Trunk flexed Gait velocity: decreased Gait velocity interpretation: <1.31 ft/sec, indicative of household ambulator   General Gait Details: cues for proximity to RW and posture, pt with narrow BOS and near scissoring steps. veering to R but able to navigate around obstacels with increased cues and time. SpO2 91% on RA     Balance Overall balance assessment: Needs assistance, History of Falls Sitting-balance support: No upper extremity supported, Feet supported Sitting balance-Leahy Scale: Fair     Standing balance support: Bilateral upper extremity supported, During functional activity, Reliant on assistive device for balance Standing balance-Leahy Scale: Poor Standing balance comment: RW in standing with minA, posterior lean at times  Communication Communication Communication: No apparent difficulties  Cognition Arousal: Alert Behavior During Therapy: WFL for tasks assessed/performed   PT - Cognitive impairments: Safety/Judgement, Problem solving                        PT - Cognition Comments: poor awareness and sequencing, needing increased cues to complete tasks safety and use DME Following commands: Intact      Cueing Cueing Techniques: Verbal cues  Exercises      General Comments General comments (skin integrity, edema, etc.): SpO2 stable on RA, HR with spike to 144bpm at rest, then 125bpm with gait      Pertinent Vitals/Pain Pain Assessment Pain Assessment: No/denies pain     PT Goals (current goals can now be found in the care plan section) Acute Rehab PT Goals Patient Stated Goal: return home PT Goal Formulation: With patient/family Time For Goal Achievement: 09/08/23 Potential to Achieve Goals: Good Progress towards PT goals: Progressing toward goals    Frequency    Min 2X/week       AM-PAC PT 6 Clicks Mobility   Outcome Measure  Help needed turning from your back to your side while in a flat bed without using bedrails?: A Little Help needed moving from lying on your back to sitting on the side of a flat bed without using bedrails?: A Little Help needed moving to and from a bed to a chair (including a wheelchair)?: A Little Help needed standing up from a chair using your arms (e.g., wheelchair or bedside chair)?: A Little Help needed to walk in hospital room?: A Little Help needed climbing 3-5 steps with a railing? : A Lot 6 Click Score: 17    End of Session Equipment Utilized During Treatment: Gait belt Activity Tolerance: Patient tolerated treatment well Patient left: in bed;with call bell/phone within reach;with bed alarm set;with family/visitor present Nurse Communication: Mobility status PT Visit Diagnosis: Other abnormalities of gait and mobility (R26.89);Difficulty in walking, not elsewhere classified (R26.2);Muscle weakness (generalized) (M62.81)     Time: 9065-9040 PT Time Calculation (min) (ACUTE ONLY): 25 min  Charges:    $Therapeutic Exercise: 8-22 mins PT General  Charges $$ ACUTE PT VISIT: 1 Visit                     Izetta Call, PT, DPT   Acute Rehabilitation Department Office [phone] Secure Chat Communication Preferred   Izetta JULIANNA Call 08/26/2023, 11:04 AM

## 2023-08-26 NOTE — Progress Notes (Addendum)
 HD#3 SUBJECTIVE:  Patient Summary: Jeffery Fernandez is a 78 y.o. person living with a pertinent PMH of metastatic NSCLC on chemotherapy, recurrent pleural effusions, HFpEF now with reduced EF, DMII (last A1c 5.0 08/20/23), depression, anxiety, CKD IV, HTN, HLD who presented with shortness of breath and admitted for acute on chronic respiratory failure.   Overnight Events: none   Interim History:  Daughter at bedside. Feeling a little better today. Was able to wean O2 down to 1L last night, is currently saturating well on room air. He walked around a little today and did not have any dizziness when walking. Daughter and patient both amenable to decreasing medications that do not make him feel better. Daughter and patient are contemplating decision to discontinue the Eliquis . In terms of his swelling, daughter notes that his fluid typically doesn't gather at his legs.    OBJECTIVE:  Vital Signs: Vitals:   08/26/23 0524 08/26/23 0532 08/26/23 0715 08/26/23 1057  BP: (!) 166/92 (!) 152/98 (!) 138/94 135/82  Pulse: 95 85 89 97  Resp: 18 15 (!) 25 19  Temp: (!) 97.4 F (36.3 C)  98.2 F (36.8 C) 98 F (36.7 C)  TempSrc: Oral  Oral Oral  SpO2: 100% 100% 100% 99%  Weight:      Height:       Supplemental O2: Nasal Cannula SpO2: 99 % O2 Flow Rate (L/min): 1 L/min  Filed Weights   08/23/23 2012  Weight: 74.7 kg     Intake/Output Summary (Last 24 hours) at 08/26/2023 1139 Last data filed at 08/26/2023 0525 Gross per 24 hour  Intake 237 ml  Output 500 ml  Net -263 ml   Net IO Since Admission: 297 mL [08/26/23 1139]  Physical Exam: Physical Exam Constitutional:      General: He is not in acute distress. HENT:     Head: Normocephalic and atraumatic.  Cardiovascular:     Rate and Rhythm: Normal rate and regular rhythm.  Pulmonary:     Effort: Pulmonary effort is normal.     Breath sounds: Decreased breath sounds present.  Abdominal:     General: Bowel sounds are normal.      Palpations: Abdomen is soft.  Skin:    General: Skin is warm and dry.  Neurological:     General: No focal deficit present.     Mental Status: He is alert and oriented to person, place, and time.  Psychiatric:        Mood and Affect: Mood normal.     Patient Lines/Drains/Airways Status     Active Line/Drains/Airways     Name Placement date Placement time Site Days   Implanted Port 02/19/23 Right Chest Single 02/19/23  1003  Chest  187   Peripheral IV 08/23/23 22 G Anterior;Right Forearm 08/23/23  1500  Forearm  2   External Urinary Catheter 08/24/23  2335  --  1            Pertinent labs and imaging:     Latest Ref Rng & Units 08/25/2023    3:00 AM 08/24/2023    3:37 AM 08/23/2023    1:13 PM  CBC  WBC 4.0 - 10.5 K/uL 6.9  7.1  7.0   Hemoglobin 13.0 - 17.0 g/dL 89.7  89.2  89.3   Hematocrit 39.0 - 52.0 % 31.3  33.0  33.7   Platelets 150 - 400 K/uL 176  182  191        Latest Ref Rng &  Units 08/26/2023    2:24 AM 08/25/2023    3:00 AM 08/24/2023    1:44 PM  CMP  Glucose 70 - 99 mg/dL 874  98    BUN 8 - 23 mg/dL 30  27    Creatinine 9.38 - 1.24 mg/dL 7.03  7.28    Sodium 864 - 145 mmol/L 136  138    Potassium 3.5 - 5.1 mmol/L 3.4  3.6    Chloride 98 - 111 mmol/L 101  103    CO2 22 - 32 mmol/L 26  26    Calcium  8.9 - 10.3 mg/dL 8.2  8.5    Total Protein 6.5 - 8.1 g/dL   6.0   Total Bilirubin 0.0 - 1.2 mg/dL   0.4   Alkaline Phos 38 - 126 U/L   92   AST 15 - 41 U/L   17   ALT 0 - 44 U/L   12     No results found.   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  Plan: #Acute on Chronic Respiratory Failure #NSCLC with metastasis to bone #Pleural Effusion Orthostatics yesterday were positive: 124/60 lying, 82/63 sitting, and 91/60 standing.  Orthostatics today improved but still positive: 134/95 laying, 120/73 sitting, and 116/70 standing. Still no reported dizziness. Patient and family have the desire to pursue scheduled therapeutic thoracenteses. Palliative care and social work consulted to discuss patient's long-term medical management goals yesterday. [facility] home hospice being arranged. Palliative care recommended PleurX catheter for symptom management, which will be placed by IR today. Sending patient with home oxygen . Will discuss with family the need for anticoagulation and risks/benefits of staying on. He continually falls at home and has a chronic DVT, concern for severe epistaxis event in June but no additional major bleeds or issues with Eliquis . Will continue to assist with symptom management. -Repeat orthostatics -Medicine reconciliation: Oral Lasix , IMDUR , and Eliquis  decision per patient and family -IR to place PleurX   #HFrEF (newly reduced EF of <20%) #Elevated BNP #CAD Previous echo 11/25/22 showed EF of  55-60%. Had catheterization in 2012 with 60-70% occlusion in the RCA. Echo on 7/26 showed left ventricular ejection fraction of < 20% with global hypokinesis with dilation. Right ventricular systolic function is  mildly reduced. Left atrial dilation with mitral valve regurgitation and right atrial pressure of . Cardiology consulted given worsening heart failure, recommends afterload reduction as they are not planning to pursue ischemic workup as he is not a good candidate. Patient restarted on Hydralazine  10mg  TID, but this was held due to patient's orthostasis. Orthostatics yesterday were positive, will  consider medication adjustment to reduce orthostasis and reduce chances of falls.  - Hold Hydralazine  10mg  TID - Isosorbide  mononitrate 30mg  - Hold 40mg  Lasix  as above - Strict I&Os - Monitor daily weights - Hold Atorvastatin  40mg  per family discussion  #CKD IV  Baseline around 2.5. Cr 2.37 on admission. Cr 2.96 today, likely in the setting of diuresis. -Avoid nephrotoxic meds -Daily BMP  #DMII #Hypoglycemia Family reports hypoglycemic events to 30 3 days ago. Home meds Trulicity  and Lantus  30 uints. Last A1c 5.0 this month. Blood glucose 98 on admission. Held all forms of insulin  in the setting of multiple hypoglycemic events. BG 115 this morning.  -Continue to hold insulin   #Anemia Hgb on admission 10.6 around baseline of 10. Suspect anemia of chronic disease due to CKD and NSCLC. Hgb today 10.2.  #Depression/anxiety - Continue home medications of Ativan  0.5mg  at bedtime PRN, Zoloft  100mg , and Bupropion  150mg .   #GERD - Continue home Famotidine  20mg   #Hx of DVT - Continue Eliquis  5mg  BID  Best Practice: Diet: Regular diet VTE: Eliquis  5mg  BID Code: DNR/DNI  Disposition planning: Family Contact: wife and daughter DISPO: Anticipated discharge today to Home pending improvement of respiratory status.  Signature:  Kazimierz Springborn, DO Internal Medicine Resident, PGY-1 Please contact the on call pager at [phone] for any urgent or emergent needs. 11:39 AM 08/26/2023

## 2023-08-26 NOTE — Consult Note (Signed)
 VAT: Troubleshoot PAC  PAC flushed, brisk blood return noted. Care RN notified.   Consult complete.

## 2023-08-26 NOTE — Progress Notes (Signed)
 IVT consult placed to assess newly accessed PAC. RN was concerned that needle wasn't in proper location. PAC flushed and brisk blood return noted. However, no antimicrobial disc noted during assessment. Will return to change dressing after patient has eaten since he was NPO for procedure.   Tex Conroy R Cheyenne Schumm, RN

## 2023-08-26 NOTE — Consult Note (Signed)
 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 male with pmhx NSCLC on chemotherapy, recurrent pleural effusions, HFpEF now with reduced EF, DMII, depression, anxiety, CKD IV, HTN, HLD. Presented to [facility] ED 08/23/23 with acute on chronic respiratory failure suspected from systolic HF in addition to underlying malignancy and other chronic lung conditions. Consultation was had with palliative care between patient and family, with decision to move forward with PleurX catheter placement on right side for recurrent pleural effusions. Cardiology evaluated patient with no new recommendations, plan to proceed with PleurX.  Patient and family have been educated regarding use of PleurX catheter for home.   Today resting comfortably with no major complaint.   DNR/DNI: If pulseless and not breathing No CPR or chest compressions.  In Pre-Arrest Conditions (Patient Is Breathing and Has A Pulse) Do not intubate. Provide all appropriate non-invasive medical interventions. Avoid ICU transfer unless indicated or required   Patient currently has DNR order in place. Discussion with the patient and family regarding wishes.  The original DNR order is maintained during the procedure and prior treatment limitations are upheld during the procedure.   Past Medical History:  Diagnosis Date   Acid reflux    Arrhythmia    Arthritis    Cataract    bilateral cataract extraction with intraoccular lens implants   Chest pain, unspecified    CHF (congestive heart failure) (HCC)    Chronic airway obstruction, not elsewhere classified    PT. DENIES HE HAS COPD   CKD (chronic kidney disease)    Colon polyps    Adenomatous Polyps 2007    Depression    Diabetes mellitus    Diverticulosis    Gout    Heart murmur    History of kidney stones    History of radiation therapy    Thoracic Spine- 01/27/23-02/10/23- Dr. Lynwood Nasuti   Hyperlipemia    Hypertension    Lung cancer (HCC) 12/2022   Nephrolithiasis    Normocytic anemia 04/23/2017   Obstructive sleep apnea (adult) (pediatric)    Other and unspecified hyperlipidemia    Other left bundle branch block    Retinal vascular occlusion, unspecified    right   Shortness of breath    on exertion   Unspecified sleep apnea    Vision loss of right eye    White coat syndrome with diagnosis of hypertension 09/19/2017    Past Surgical History:  Procedure Laterality Date   ANTERIOR CERVICAL DECOMP/DISCECTOMY FUSION N/A 06/26/2016   Procedure: Re-Exploration of Cervical Wound;  Surgeon: Onetha Kuba, MD;  Location: [facility] OR;  Service: Neurosurgery;  Laterality: N/A;   ANTERIOR CERVICAL DECOMP/DISCECTOMY FUSION N/A 06/26/2016   Procedure: ANTERIOR CERVICAL DECOMPRESSION/DISCECTOMY FUSION, INTERBODY PROSTESIS, PLATE, CERVICAL THREE CERVICAL FOUR, CERVICAL FOUR CERVICAL FIVE CERVICAL SIX;  Surgeon: Mavis Purchase, MD;  Location: [facility] OR;  Service: Neurosurgery;  Laterality: N/A;   APPLICATION OF WOUND VAC  06/05/2020   Procedure: APPLICATION OF WOUND VAC;  Surgeon: Mavis Purchase, MD;  Location: [facility] OR;  Service: Neurosurgery;;   BACK SURGERY     CATARACT EXTRACTION Bilateral    FEMUR IM NAIL Right 05/06/2023   Procedure: RIGHT FEMUR NAILING;  Surgeon: Genelle Standing, MD;  Location: [facility] OR;  Service: Orthopedics;  Laterality: Right;  RIGHT INTERTROCHANTERIC INTRAMEDULLARY HIP NAIL   IR IMAGING GUIDED PORT INSERTION  02/19/2023   IR THORACENTESIS ASP PLEURAL SPACE W/IMG GUIDE  07/24/2023   KIDNEY STONE SURGERY     KYPHOPLASTY N/A 01/19/2018   Procedure: KYPHOPLASTY LUMBAR THREE;  Surgeon: Mavis Purchase, MD;  Location: [facility] OR;  Service: Neurosurgery;  Laterality: N/A;  KYPHOPLASTY LUMBAR  THREE   LUMBAR WOUND DEBRIDEMENT N/A 06/05/2020   Procedure: LUMBAR WOUND DEBRIDEMENT;  Surgeon: Mavis Purchase, MD;  Location: [facility] OR;  Service: Neurosurgery;  Laterality: N/A;    Allergies: Amlodipine , Penicillins, and Hydralazine   Medications: Prior to Admission medications   Medication Sig Start Date End Date Taking? Authorizing Provider  apixaban  (ELIQUIS ) 5 MG TABS tablet Take 1 tablet (5 mg total) by mouth 2 (two) times daily. 07/24/23  Yes Heilingoetter, Cassandra L, PA-C  atorvastatin  (LIPITOR) 40 MG tablet Take 40 mg by mouth at bedtime.   Yes [provider]  buPROPion  (WELLBUTRIN  XL) 150 MG 24 hr tablet Take 150 mg by mouth every morning.   Yes [provider]  Dulaglutide  (TRULICITY ) 1.5 MG/0.5ML SOAJ Inject 1.5 mg into the skin daily. Takes on Thursdays 10/01/22  Yes [provider]  HYDROcodone -acetaminophen  (NORCO/VICODIN) 5-325 MG tablet Take 1 tablet by mouth every 6 (six) hours as needed for moderate pain (pain score 4-6). 07/16/23  Yes Pickenpack-Cousar, Fannie SAILOR, NP  insulin  glargine (LANTUS ) 100 UNIT/ML injection Inject 41 Units into the skin in the morning.   Yes [provider]  loratadine  (CLARITIN ) 10 MG tablet Take 10 mg by mouth daily.   Yes [provider]  LORazepam  (ATIVAN ) 0.5 MG tablet Take 1 tablet (0.5 mg total) by mouth every 8 (eight) hours as needed for anxiety. 0.5 mg p.o. every 8 hours as needed for anxiety/nausea. 08/09/23  Yes Pickenpack-Cousar, Fannie SAILOR, NP  ondansetron  (ZOFRAN ) 4 MG tablet Take 1 tablet (4 mg total) by mouth every 8 (eight) hours as needed for nausea or vomiting. 08/09/23  Yes Pickenpack-Cousar, Fannie SAILOR, NP  sertraline  (ZOLOFT ) 100 MG tablet Take 100 mg by mouth in the morning.   Yes [provider]  famotidine  (PEPCID ) 20 MG tablet Take 1 tablet (20 mg total) by mouth 2 (two) times daily. Patient taking differently: Take 20 mg by mouth daily. 03/04/23   Pickenpack-Cousar, Fannie SAILOR, NP   OXYGEN  Inhale 1.5 L into the lungs daily as needed (Shortness of breath).    [provider]     Family History  Problem Relation Age of Onset   Cancer Mother    Uterine cancer Mother    Heart disease Father    Stroke Father    Cancer Sister        Lung cancer   Heart attack Brother 60       heart attack and CHF/ also some form of EP ablation   Parkinson's disease Brother    Atrial fibrillation Brother    Arthritis/Rheumatoid Cousin    Diabetes Other    Colon cancer Neg Hx    Stomach cancer Neg Hx    Liver cancer Neg Hx    Rectal cancer Neg Hx    Esophageal cancer Neg Hx     Social History   Socioeconomic History   Marital status: Married    Spouse name: Ann   Number of children: 1   Years of education: Not on file   Highest education level: Not on file  Occupational History   Occupation:  Retired    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 Orginally from [place]  and moved back there after retirement. He  Has a daughter living in [place]. Quit smoking around 1988. Married   Right-handed   Caffeine: soda daily, decaf coffee   Social Drivers of Corporate investment banker Strain: Low Risk  (04/20/2023)   Received from [facility]   Overall Financial Resource Strain (CARDIA)    Difficulty of Paying Living Expenses: Not hard at all  Food Insecurity: No Food  Insecurity (08/23/2023)   Hunger Vital Sign    Worried About Running Out of Food in the Last Year: Never true    Ran Out of Food in the Last Year: Never true  Transportation Needs: No Transportation Needs (08/23/2023)   PRAPARE - Administrator, Civil Service (Medical): No    Lack of Transportation (Non-Medical): No  Physical Activity: Unknown (04/20/2023)   Received from [facility]   Exercise Vital Sign    On average, how many days per week do you engage in moderate to strenuous exercise (like a brisk walk)?: 0 days    Minutes of Exercise per Session: Not on file  Stress: Stress Concern Present (04/20/2023)   Received from [facility] of Occupational Health - Occupational Stress Questionnaire    Feeling of Stress : Rather much  Social Connections: Moderately Integrated (08/23/2023)   Social Connection and Isolation Panel    Frequency of Communication with Friends and Family: More than three times a week    Frequency of Social Gatherings with Friends and Family: More than three times a week    Attends Religious Services: 1 to 4 times per year    Active Member of [employer] or Organizations: No    Attends Banker Meetings: Never    Marital Status: Married     Review of Systems: A 12 point ROS discussed and pertinent positives are indicated in the HPI above.  All other systems are negative.    Vital Signs: BP 135/82 (BP Location: Left Arm)   Pulse 97   Temp 98 F (36.7 C) (Oral)   Resp 19   Ht 5' 9 (1.753 m)   Wt 164 lb 10.9 oz (74.7 kg)   SpO2 99%   BMI 24.32 kg/m   Advance Care Plan: No documents on file  Physical Exam Vitals and nursing note reviewed.  Constitutional:      General: He is not in acute distress. HENT:     Mouth/Throat:     Mouth: Mucous membranes are moist.     Pharynx: Oropharynx is clear.  Cardiovascular:     Rate and Rhythm: Normal rate and regular rhythm.  Pulmonary:  Effort: Pulmonary effort is  normal.     Comments: Mildly diminished breath sounds bilaterally Abdominal:     Palpations: Abdomen is soft.     Tenderness: There is no abdominal tenderness.  Musculoskeletal:     Right lower leg: No edema.     Left lower leg: No edema.  Skin:    General: Skin is warm and dry.  Neurological:     Mental Status: He is alert and oriented to person, place, and time. Mental status is at baseline.     Imaging: US  THORACENTESIS ASP PLEURAL SPACE W/IMG GUIDE Result Date: 08/26/2023 INDICATION: 78 year old male with history of lung cancer, recurrent right pleural effusion. IR was requested for diagnostic and therapeutic right-sided thoracentesis. EXAM: ULTRASOUND GUIDED DIAGNOSTIC AND THERAPEUTIC RIGHT-SIDED THORACENTESIS MEDICATIONS: 7 cc of 1% lidocaine  COMPLICATIONS: None immediate. PROCEDURE: An ultrasound guided thoracentesis was thoroughly discussed with the patient and questions answered. The benefits, risks, alternatives and complications were also discussed. The patient understands and wishes to proceed with the procedure. Written consent was obtained. Ultrasound was performed to localize and mark an adequate pocket of fluid in the right chest. The area was then prepped and draped in the normal sterile fashion. 1% Lidocaine  was used for local anesthesia. Under ultrasound guidance a 6 Fr Safe-T-Centesis catheter was introduced. Thoracentesis was performed. The catheter was removed and a dressing applied. FINDINGS: A total of approximately 2.25 L of clear, straw-colored pleural fluid was removed. Samples were sent to the laboratory as requested by the clinical team. IMPRESSION: Successful ultrasound guided right thoracentesis yielding 2.25 L of pleural fluid. Procedure performed by Carlin Griffon, PA-C Electronically Signed   By: Marcey Moan M.D.   On: 08/26/2023 07:40   VAS US  LOWER EXTREMITY VENOUS (DVT) (7a-7p) Result Date: 08/25/2023  Lower Venous DVT Study Patient Name:  RODRICKUS MIN   Date of Exam:   08/24/2023 Medical Rec #: [id number]      Accession #:    [id number] Date of Birth: [birth year]       Patient Gender: M Patient Age:   68 years Exam Location:  [facility] Procedure:      VAS US  LOWER EXTREMITY VENOUS (DVT) Referring Phys: WARREN BARRETT --------------------------------------------------------------------------------  Indications: Swelling, and SOB.  Risk Factors: Lung cancer with mets to to bone on chemo/immunotherapy DVT 07/04/23 Age indeterminate DVT in one of the paired left posterior tibial veins. Anticoagulation: Eliquis . Comparison Study: Prior left LEV done 07/04/23 Performing Technologist: Alberta Lis RVS  Examination Guidelines: A complete evaluation includes B-mode imaging, spectral Doppler, color Doppler, and power Doppler as needed of all accessible portions of each vessel. Bilateral testing is considered an integral part of a complete examination. Limited examinations for reoccurring indications may be performed as noted. The reflux portion of the exam is performed with the patient in reverse Trendelenburg.  +-----+---------------+---------+-----------+----------+--------------+ RIGHTCompressibilityPhasicitySpontaneityPropertiesThrombus Aging +-----+---------------+---------+-----------+----------+--------------+ CFV  Full           Yes      No                                  +-----+---------------+---------+-----------+----------+--------------+ SFJ  Full                                                        +-----+---------------+---------+-----------+----------+--------------+   +---------+---------------+---------+-----------+----------+-----------------+  LEFT     CompressibilityPhasicitySpontaneityPropertiesThrombus Aging    +---------+---------------+---------+-----------+----------+-----------------+ CFV      Full           Yes      No                                      +---------+---------------+---------+-----------+----------+-----------------+ SFJ      Full                                                           +---------+---------------+---------+-----------+----------+-----------------+ FV Prox  Full           Yes      No                                     +---------+---------------+---------+-----------+----------+-----------------+ FV Mid   Full                                                           +---------+---------------+---------+-----------+----------+-----------------+ FV DistalFull                                                           +---------+---------------+---------+-----------+----------+-----------------+ PFV      Full           Yes      No                                     +---------+---------------+---------+-----------+----------+-----------------+ POP      Full           Yes      No                                     +---------+---------------+---------+-----------+----------+-----------------+ PTV      Partial                                      Age Indeterminate +---------+---------------+---------+-----------+----------+-----------------+ PERO     Full                                                           +---------+---------------+---------+-----------+----------+-----------------+ Gastroc  Full                                                           +---------+---------------+---------+-----------+----------+-----------------+  Summary: RIGHT: - No evidence of common femoral vein obstruction.   LEFT: - Findings consistent with age indeterminate deep vein thrombosis involving one of the paired left posterior tibial veins.  - Findings appear essentially unchanged compared to previous examination.  *See table(s) above for measurements and observations. Electronically signed by Norman Serve on 08/25/2023 at 2:33:43 PM.    Final    NM Pulmonary Perfusion Result  Date: 08/25/2023 CLINICAL DATA:  Pulmonary suspected. High probability. Metastatic lung cancer. EXAM: NUCLEAR MEDICINE PERFUSION LUNG SCAN TECHNIQUE: Perfusion images were obtained in multiple projections after intravenous injection of radiopharmaceutical. RADIOPHARMACEUTICALS:  4.4 mCi Tc-30m MAA COMPARISON:  None Available. FINDINGS: No wedge-shaped peripheral perfusion defect within LEFT or RIGHT lung to suggest acute pulmonary embolism. Normal perfusion pattern. IMPRESSION: No evidence acute pulmonary embolism Electronically Signed   By: Jackquline Boxer M.D.   On: 08/25/2023 11:28   DG Chest 1 View Result Date: 08/24/2023 CLINICAL DATA:  Status post thoracentesis EXAM: CHEST  1 VIEW COMPARISON:  chest x-ray 08/23/2023 FINDINGS: Left-sided chest port catheter tip projects over the distal SVC. The heart and mediastinal contours are enlarged, unchanged. Right upper lobe airspace disease has mildly decreased. There are small pleural effusions, unchanged. No pneumothorax or acute fracture identified. Cervical spinal fusion plate present. IMPRESSION: 1. No pneumothorax. 2. Mild decrease in right upper lobe airspace disease. 3. Stable small pleural effusions. Electronically Signed   By: Greig Pique M.D.   On: 08/24/2023 15:34   ECHOCARDIOGRAM COMPLETE Result Date: 08/23/2023    ECHOCARDIOGRAM REPORT   Patient Name:   LILY KERNEN Date of Exam: 08/23/2023 Medical Rec #:  [id number]     Height:       70.0 in Accession #:    [id number]    Weight:       164.1 lb Date of Birth:  21-Dec-1945      BSA:          1.919 m Patient Age:    78 years      BP:           148/104 mmHg Patient Gender: M             HR:           110 bpm. Exam Location:  Inpatient Procedure: 2D Echo (Both Spectral and Color Flow Doppler were utilized during            procedure). STAT ECHO Indications:    dyspnea  History:        Patient has prior history of Echocardiogram examinations, most                 recent 11/25/2022. CAD, COPD,  Arrythmias:LBBB; Risk                 Factors:Diabetes, Sleep Apnea, Hypertension and Dyslipidemia.  Sonographer:    Tinnie Barefoot RDCS Referring Phys: [phone] ROBERT C PATERSON IMPRESSIONS  1. Left ventricular ejection fraction, by estimation, is <20%. The left ventricle has severely decreased function. The left ventricle demonstrates global hypokinesis. The left ventricular internal cavity size was dilated. Left ventricular diastolic function could not be evaluated.  2. Right ventricular systolic function is mildly reduced. The right ventricular size is normal. Tricuspid regurgitation signal is inadequate for assessing PA pressure.  3. Left atrial size was moderately dilated.  4. The mitral valve is degenerative. Moderate mitral valve regurgitation. No evidence of mitral stenosis.  5. The aortic valve is grossly normal. Aortic valve regurgitation is mild.  Aortic valve sclerosis is present, with no evidence of aortic valve stenosis.  6. The inferior vena cava is normal in size with greater than 50% respiratory variability, suggesting right atrial pressure of 3 mmHg. Comparison(s): A prior study was performed on 11/25/2022. LVEF significantly reduced compared to prior study (previously reported 55-60%). Conclusion(s)/Recommendation(s): Results conveyed to ordering physician Dr. Jakie via secure chat (message reviewed). Also notified primary team (Dr. Zheng and Smucker) Dr. Elicia acknowledged the message. FINDINGS  Left Ventricle: Left ventricular ejection fraction, by estimation, is <20%. The left ventricle has severely decreased function. The left ventricle demonstrates global hypokinesis. The left ventricular internal cavity size was dilated. There is no left ventricular hypertrophy. Left ventricular diastolic function could not be evaluated due to nondiagnostic images. Left ventricular diastolic function could not be evaluated. Right Ventricle: The right ventricular size is normal. Right vetricular wall  thickness was not well visualized. Right ventricular systolic function is mildly reduced. Tricuspid regurgitation signal is inadequate for assessing PA pressure. Left Atrium: Left atrial size was moderately dilated. Right Atrium: Right atrial size was normal in size. Pericardium: There is no evidence of pericardial effusion. Mitral Valve: The mitral valve is degenerative in appearance. Mild mitral annular calcification. Moderate mitral valve regurgitation. No evidence of mitral valve stenosis. Tricuspid Valve: The tricuspid valve is grossly normal. Tricuspid valve regurgitation is not demonstrated. No evidence of tricuspid stenosis. Aortic Valve: The aortic valve is grossly normal. Aortic valve regurgitation is mild. Aortic valve sclerosis is present, with no evidence of aortic valve stenosis. Pulmonic Valve: The pulmonic valve was not well visualized. Pulmonic valve regurgitation is not visualized. No evidence of pulmonic stenosis. Aorta: The aortic root is normal in size and structure and the ascending aorta was not well visualized. Venous: The inferior vena cava is normal in size with greater than 50% respiratory variability, suggesting right atrial pressure of 3 mmHg. IAS/Shunts: The interatrial septum was not well visualized.  LEFT VENTRICLE PLAX 2D LVIDd:         5.10 cm LVIDs:         5.00 cm LV PW:         1.10 cm LV IVS:        1.10 cm LVOT diam:     1.90 cm LV SV:         22 LV SV Index:   12 LVOT Area:     2.84 cm  RIGHT VENTRICLE            IVC RV Basal diam:  3.10 cm    IVC diam: 2.00 cm RV S prime:     9.68 cm/s TAPSE (M-mode): 1.5 cm LEFT ATRIUM           Index        RIGHT ATRIUM           Index LA diam:      3.70 cm 1.93 cm/m   RA Area:     14.00 cm LA Vol (A4C): 87.5 ml 45.60 ml/m  RA Volume:   34.50 ml  17.98 ml/m  AORTIC VALVE LVOT Vmax:   62.20 cm/s LVOT Vmean:  38.600 cm/s LVOT VTI:    0.078 m  AORTA Ao Root diam: 3.20 cm  SHUNTS Systemic VTI:  0.08 m Systemic Diam: 1.90 cm Sunit Tolia  Electronically signed by Madonna Large Signature Date/Time: 08/23/2023/5:16:09 PM    Final    DG Chest 2 View Result Date: 08/23/2023 CLINICAL DATA:  Shortness of breath. EXAM: CHEST -  2 VIEW COMPARISON:  08/14/2023 FINDINGS: Stable enlarged cardiac silhouette. Left jugular catheter tip in the superior aspect of the right atrium, proximally 1 cm inferior to the superior cavoatrial junction. Increased airspace consolidation in the right lung, most pronounced in the outer upper lung zone. Clear left lung. Small bilateral pleural effusions. Mildly elevated right hemidiaphragm. Cervical spine fixation hardware. IMPRESSION: 1. Increased right lung airspace consolidation, most pronounced in the outer upper lung zone, consistent with pneumonia. 2. Mild right basilar atelectasis. 3. Small bilateral pleural effusions. 4. Stable cardiomegaly. Electronically Signed   By: Elspeth Bathe M.D.   On: 08/23/2023 14:08   DG Chest 1 View Result Date: 08/14/2023 CLINICAL DATA:  [id number] S/P thoracentesis [id number] EXAM: CHEST  1 VIEW COMPARISON:  August 13, 2023 FINDINGS: Left chest port terminates the cavoatrial junction. Persistent patchy opacities in the right mid and upper lung zones. No pneumothorax or right pleural effusion trace left pleural effusion. No cardiomegaly. No acute fracture or destructive lesion. Multilevel degenerative disc disease of the spine. Cervical fusion hardware. IMPRESSION: 1. Patchy airspace opacities in the right mid and upper lung zones, possibly representing atelectasis. 2. No residual right pleural effusion or pneumothorax. Trace left pleural effusion. Electronically Signed   By: Rogelia Myers M.D.   On: 08/14/2023 15:31   US  THORACENTESIS ASP PLEURAL SPACE W/IMG GUIDE Result Date: 08/14/2023 INDICATION: Patient with history of lung cancer, recurrent right pleural effusion. Request received for therapeutic right thoracentesis. EXAM: ULTRASOUND GUIDED THERAPEUTIC RIGHT THORACENTESIS MEDICATIONS: 8 ml  1  % lidocaine  with epinephrine  COMPLICATIONS: None immediate. PROCEDURE: An ultrasound guided thoracentesis was thoroughly discussed with the patient/daughter and questions answered. The benefits, risks, alternatives and complications were also discussed. The patient/daughter understands and wishes to proceed with the procedure. Written consent was obtained. Ultrasound was performed to localize and mark an adequate pocket of fluid in the right chest. The area was then prepped and draped in the normal sterile fashion. 1% Lidocaine  was used for local anesthesia. Under ultrasound guidance a 6 Fr Safe-T-Centesis catheter was introduced. Thoracentesis was performed. The catheter was removed and a dressing applied. FINDINGS: A total of approximately 1.7 liters of yellow fluid was removed. IMPRESSION: Successful ultrasound guided therapeutic right thoracentesis yielding 1.7 liters of pleural fluid. Performed by: Franky Rakers, PA-C Electronically Signed   By: Juliene Balder M.D.   On: 08/14/2023 15:29   DG Chest 2 View Result Date: 08/13/2023 CLINICAL DATA:  Lung cancer pleural effusion EXAM: CHEST - 2 VIEW COMPARISON:  07/28/2023, CT 07/28/2023, radiograph 02/20/2023 FINDINGS: Left-sided central venous port tip at the right atrium. Enlarged cardiomediastinal silhouette with aortic atherosclerosis. Similar moderate right and small left pleural effusions. Right upper lung opacity, corresponding to known mass in the region. IMPRESSION: 1. Similar moderate right and small left pleural effusions. 2. Right upper lung opacity, corresponding to known mass in the region. Electronically Signed   By: Luke Bun M.D.   On: 08/13/2023 15:31   CT HEAD WO CONTRAST Result Date: 07/28/2023 CLINICAL DATA:  Head trauma, fall, on anticoagulation. EXAM: CT HEAD WITHOUT CONTRAST TECHNIQUE: Contiguous axial images were obtained from the base of the skull through the vertex without intravenous contrast. RADIATION DOSE REDUCTION: This exam was  performed according to the departmental dose-optimization program which includes automated exposure control, adjustment of the mA and/or kV according to patient size and/or use of iterative reconstruction technique. COMPARISON:  CT head 12/31/2022. FINDINGS: Brain: No acute intracranial hemorrhage. No CT evidence of acute infarct. Nonspecific hypoattenuation in  the periventricular and subcortical white matter favored to reflect chronic microvascular ischemic changes. Similar mild parenchymal volume loss. No edema, mass effect, or midline shift. The basilar cisterns are patent. Ventricles: The ventricles are normal. Vascular: Atherosclerotic calcifications of the carotid siphons. No hyperdense vessel. Skull: No acute or aggressive finding. Orbits: Bilateral lens replacement. Sinuses: The visualized paranasal sinuses are clear. Other: Mastoid air cells are clear. IMPRESSION: No CT evidence of acute intracranial abnormality. Extensive chronic microvascular ischemic changes and mild parenchymal volume loss. Electronically Signed   By: Donnice Mania M.D.   On: 07/28/2023 13:51   CT CERVICAL SPINE WO CONTRAST Result Date: 07/28/2023 CLINICAL DATA:  Poly trauma, blunt. Status post fall. On blood thinners. EXAM: CT CERVICAL SPINE WITHOUT CONTRAST TECHNIQUE: Multidetector CT imaging of the cervical spine was performed without intravenous contrast. Multiplanar CT image reconstructions were also generated. RADIATION DOSE REDUCTION: This exam was performed according to the departmental dose-optimization program which includes automated exposure control, adjustment of the mA and/or kV according to patient size and/or use of iterative reconstruction technique. COMPARISON:  Cervical spine radiographs 05/09/2017. FINDINGS: Alignment: Straightening of the usual cervical lordosis with a mild anterolisthesis at C2-3 and C7-T1: Similar to prior radiographs. Skull base and vertebrae: No evidence of acute fracture or traumatic  subluxation. Status post C3-6 ACDF with an anterior plate, screws and interbody bone plugs. 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 Soft tissues and spinal canal: No prevertebral fluid or swelling. No visible canal hematoma. Disc levels: Asymmetric uncinate spurring and facet hypertrophy on the left at C2-3 contribute to moderate left foraminal narrowing. There is some residual foraminal narrowing at the fused levels, greatest on the right at C5-6. There are adjacent segment changes at C6-7 with loss of disc height, posterior osteophytes and bilateral uncinate spurring contributing to mild spinal stenosis and moderate foraminal narrowing bilaterally. There is advanced facet hypertrophy at C7-T1 accounting for the anterolisthesis and contributing to moderate foraminal narrowing bilaterally. Upper chest: Chest findings dictated separately. Other: Ossification of the ligamentum nuchae. Bilateral carotid atherosclerosis. Left-sided central line in place. IMPRESSION: 1. No evidence of acute cervical spine fracture, traumatic subluxation or static signs of instability. 2. 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 3. Adjacent segment changes at C6-7 with mild spinal stenosis and moderate foraminal narrowing bilaterally. 4. Advanced facet hypertrophy at C7-T1 contributing to moderate foraminal narrowing bilaterally. Electronically Signed   By: Elsie Perone M.D.   On: 07/28/2023 13:41   CT CHEST ABDOMEN PELVIS W CONTRAST Result Date: 07/28/2023 CLINICAL DATA:  Trauma, metastatic lung cancer * Tracking Code: BO * EXAM: CT CHEST, ABDOMEN, AND PELVIS WITH CONTRAST TECHNIQUE: Multidetector CT imaging of the chest, abdomen and pelvis was performed following the standard protocol during bolus administration of intravenous contrast. RADIATION DOSE REDUCTION: This exam was performed according to the departmental dose-optimization program which includes automated exposure control, adjustment of the mA and/or kV  according to patient size and/or use of iterative reconstruction technique. CONTRAST:  60mL OMNIPAQUE  IOHEXOL  350 MG/ML SOLN COMPARISON:  CT chest abdomen pelvis, 07/18/2023 FINDINGS: CT CHEST FINDINGS Cardiovascular: Left chest port catheter. Aortic atherosclerosis. Cardiomegaly. Three-vessel coronary artery calcifications. Small pericardial effusion. No pericardial effusion. Mediastinum/Nodes: Unchanged prominent mediastinal lymph nodes. Thyroid  gland, trachea, and esophagus demonstrate no significant findings. Lungs/Pleura: Diminished volume of moderate right and small left pleural effusions. Unchanged treated mass in the right upper lobe (series 5, image 46). Musculoskeletal: No chest wall abnormality. No acute osseous findings. CT ABDOMEN PELVIS FINDINGS Hepatobiliary: No solid liver  abnormality is seen. No gallstones, gallbladder wall thickening, or biliary dilatation. Pancreas: Unremarkable. No pancreatic ductal dilatation or surrounding inflammatory changes. Spleen: Normal in size without significant abnormality. Adrenals/Urinary Tract: Adrenal glands are unremarkable. Nonobstructive calculus of the inferior pole of the right kidney. No left-sided calculi, ureteral calculi, or hydronephrosis. Bladder is unremarkable. Stomach/Bowel: Stomach is within normal limits. Appendix appears normal. No evidence of bowel wall thickening, distention, or inflammatory changes. Pancolonic diverticulosis. Vascular/Lymphatic: No significant vascular findings are present. No enlarged abdominal or pelvic lymph nodes. Reproductive: No mass or other abnormality. Other: No abdominal wall hernia or abnormality. No ascites. Musculoskeletal: No acute osseous findings. Intramedullary nail fixation of the right femur. Unchanged chronic wedge deformities of L3 and L4 with vertebral cement augmentation of L3 through L5 and posterior fusion of L4-L5. Unchanged sclerotic osseous metastasis of T10. IMPRESSION: 1. No CT evidence of acute  traumatic injury to the chest, abdomen, or pelvis. 2. Diminished volume of moderate right and small left pleural effusions. 3. Unchanged treated mass in the right upper lobe. 4. Unchanged prominent mediastinal lymph nodes. 5. Nonobstructive right nephrolithiasis. 6. Pancolonic diverticulosis without evidence of acute diverticulitis. 7. Cardiomegaly and coronary artery disease. Aortic Atherosclerosis (ICD10-I70.0). Electronically Signed   By: Marolyn JONETTA Jaksch M.D.   On: 07/28/2023 13:36   DG Pelvis Portable Result Date: 07/28/2023 CLINICAL DATA:  Fall. EXAM: PORTABLE PELVIS 1-2 VIEWS COMPARISON:  None Available. FINDINGS: No acute fracture or dislocation. The bones are osteopenic. Status post prior ORIF of the right femur. Lower lumbar fusion hardware. The soft tissues are unremarkable. IMPRESSION: 1. No acute fracture or dislocation. 2. Osteopenia. Electronically Signed   By: Vanetta Chou M.D.   On: 07/28/2023 13:17   DG Chest Portable 1 View Result Date: 07/28/2023 CLINICAL DATA:  Pain after fall history of non-small-cell lung cancer EXAM: PORTABLE CHEST 1 VIEW COMPARISON:  X-ray 07/24/2023. FINDINGS: Increased small right pleural effusion. There is some increasing right mid lower lung opacity. The left inferior costophrenic angle is clipped off the edge of the film. No pneumothorax. Enlarged cardiopericardial silhouette with some prominence of the central vasculature. Left IJ chest port in place with tip overlying the SVC right atrial junction. Overlapping cardiac leads. Fixation hardware along the cervical spine. Osteopenia. IMPRESSION: Slight increase in right-sided pleural effusion and lung opacity. Chest port. No pneumothorax. Left inferior costophrenic angle is clipped off the edge of the film. Electronically Signed   By: Ranell Bring M.D.   On: 07/28/2023 12:50    Labs:  CBC: Recent Labs    08/13/23 1331 08/23/23 1313 08/24/23 0337 08/25/23 0300  WBC 6.7 7.0 7.1 6.9  HGB 10.5* 10.6* 10.7*  10.2*  HCT 31.5* 33.7* 33.0* 31.3*  PLT 164 191 182 176    COAGS: Recent Labs    12/31/22 1745  INR 1.2  APTT 32    BMP: Recent Labs    08/23/23 1313 08/24/23 0337 08/25/23 0300 08/26/23 0224  NA 138 140 138 136  K 3.9 3.9 3.6 3.4*  CL 104 106 103 101  CO2 24 25 26 26   GLUCOSE 98 51* 98 125*  BUN 22 23 27* 30*  CALCIUM  8.6* 8.6* 8.5* 8.2*  CREATININE 2.37* 2.36* 2.71* 2.96*  GFRNONAA 27* 27* 23* 21*    LIVER FUNCTION TESTS: Recent Labs    07/04/23 0925 07/24/23 1425 08/13/23 1331 08/24/23 1344  BILITOT 0.5 0.5 0.4 0.4  AST 13* 16 18 17   ALT 10 10 13 12   ALKPHOS 105 107 135* 92  PROT 6.9 7.2 6.5 6.0*  ALBUMIN  3.0* 3.3* 2.8* 2.1*    TUMOR MARKERS: No results for input(s): AFPTM, CEA, CA199, CHROMGRNA in the last 8760 hours.  Assessment and Plan:  Jeffery Fernandez is a 78 y.o. male with pmhx NSCLC on chemotherapy, recurrent pleural effusions, HFpEF now with reduced EF, DMII, depression, anxiety, CKD IV, HTN, HLD. Presented to [facility] 08/23/23 with acute on chronic respiratory failure suspected from systolic HF in addition to underlying malignancy and other chronic lung conditions. Consultation was had with palliative care between patient and family, with decision to move forward with PleurX catheter placement on right side for recurrent pleural effusions. Cardiology evaluated patient with no new recommendations, plan to proceed with PleurX.  Patient and family have been educated regarding use of PleurX catheter for home.   Patient and family have been educated regarding use of PleurX catheter for home.   Risks and benefits discussed with the patient including bleeding, infection, damage to adjacent structures, malfunction of the catheter with need for additional procedures.  All of the patient's questions were answered, patient is agreeable to proceed. Consent signed and in chart.   Thank you for allowing our service to participate in KOLEMAN MARLING 's care.  Electronically Signed: Kimble VEAR Clas, PA-C   08/26/2023, 11:32 AM      I spent a total of 20 Minutes    in face to face in clinical consultation, greater than 50% of which was counseling/coordinating care for right PleurX catheter placement.

## 2023-08-26 NOTE — Progress Notes (Signed)
 [facility] Liaison Note   [facility] continues to follow for discharge disposition for hospice services at home.     Please call with any hospice related questions or concerns.   Henderson Newcomer, LPN [facility] Liaison [phone]

## 2023-08-26 NOTE — Evaluation (Signed)
 Occupational Therapy Evaluation Patient Details Name: Jeffery Fernandez MRN: [id number] DOB: 06-29-1945 Today's Date: 08/26/2023   History of Present Illness   78 y.o male admitted 08/23/23 for SOB with recurrent pleural effusion, and acute systolic heart failure EF<20%. 7/27 thoracentesis and age indeterminate LLE DVT. 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     Clinical Impressions Pt was seen with Physical Therapy as daughter reported when in the hospital they want to be able to keep the pt moving with planning to go home with hospice. Pt's daughter still reported no further DME needs. However, educated daughter about alarm devices at home for when the pt may attempt to get up without assist as noted in session needs constant supervision and CGA when ambulating as would attempt to leave walker or bump into walls.  Acute Occupational Therapy to continue to follow.     If plan is discharge home, recommend the following:   A little help with walking and/or transfers;A little help with bathing/dressing/bathroom;Assistance with cooking/housework;Direct supervision/assist for medications management;Direct supervision/assist for financial management;Assist for transportation     Functional Status Assessment   Patient has had a recent decline in their functional status and demonstrates the ability to make significant improvements in function in a reasonable and predictable amount of time.     Equipment Recommendations   None recommended by OT (daughter reproted no other needs)     Recommendations for Other Services         Precautions/Restrictions   Precautions Precautions: Fall Recall of Precautions/Restrictions: Impaired Precaution/Restrictions Comments: 6-8 falls in the last 6 months, watch BP and SPO2 Restrictions Weight Bearing Restrictions Per Provider Order: No      Mobility Bed Mobility Overal bed mobility: Needs Assistance Bed Mobility: Supine to Sit, Sit to Supine     Supine to sit: Supervision, Used rails Sit to supine: Supervision   General bed mobility comments: increased time and use of rail    Transfers Overall transfer level: Needs assistance Equipment used: Rolling walker (2 wheels) Transfers: Sit to/from Stand, Bed to chair/wheelchair/BSC Sit to Stand: Min assist     Step pivot transfers: Min assist     General transfer comment: min assist to rise from bed and recliner with cues for hand placement and safety. minA to manage RW with turning      Balance Overall balance assessment: Needs assistance, History of Falls Sitting-balance support: No upper extremity supported, Feet supported Sitting balance-Leahy Scale: Fair     Standing balance support: Bilateral upper extremity supported, During functional activity, Reliant on assistive device for balance Standing balance-Leahy Scale: Poor Standing balance comment: RW in standing with minA, posterior lean at times                           ADL either performed or assessed with clinical judgement   ADL Overall ADL's : Needs assistance/impaired Eating/Feeding: NPO   Grooming: Contact guard assist;Standing   Upper Body Bathing: Contact guard assist;Sitting   Lower Body Bathing: Contact guard assist;Sit to/from stand;Minimal assistance   Upper Body Dressing : Contact guard assist;Standing;Sitting   Lower Body Dressing: Contact guard assist;Minimal assistance;Sit to/from stand   Toilet Transfer: Contact guard assist;Rolling walker (2 wheels)   Toileting- Clothing Manipulation and Hygiene: Contact guard assist;Sit to/from stand       Functional mobility during ADLs: Contact  guard assist;Minimal assistance;Rolling walker (2 wheels)       Vision Baseline Vision/History: 1 Wears glasses Ability to See in Adequate Light: 0 Adequate Patient Visual Report: No  change from baseline Vision Assessment?: No apparent visual deficits     Perception         Praxis         Pertinent Vitals/Pain Pain Assessment Pain Assessment: No/denies pain     Extremity/Trunk Assessment Upper Extremity Assessment Upper Extremity Assessment: Generalized weakness   Lower Extremity Assessment Lower Extremity Assessment: Defer to PT evaluation   Cervical / Trunk Assessment Cervical / Trunk Assessment: Kyphotic   Communication Communication Communication: No apparent difficulties   Cognition Arousal: Alert Behavior During Therapy: WFL for tasks assessed/performed Cognition: Cognition impaired   Orientation impairments: Situation Awareness: Intellectual awareness impaired, Online awareness impaired Memory impairment (select all impairments): Short-term memory, Working memory Attention impairment (select first level of impairment): Selective attention Executive functioning impairment (select all impairments): Problem solving, Reasoning, Sequencing OT - Cognition Comments: Pt noted if given an small space to anvigate they were often bumping into items and leaving walker. Reported his daughter was his sister in the room                 Following commands: Intact       Cueing  General Comments   Cueing Techniques: Verbal cues  stable on rA with max HR at 144 at rest and 125 with ambulation   Exercises     Shoulder Instructions      Home Living Family/patient expects to be discharged to:: Private residence Living Arrangements: Spouse/significant other;Children Available Help at Discharge: Family;Available 24 hours/day Type of Home: House Home Access: Ramped entrance     Home Layout: One level     Bathroom Shower/Tub: Walk-in shower;Tub/shower unit   Bathroom Toilet: Handicapped height Bathroom Accessibility: Yes How Accessible: Accessible via walker Home Equipment: Rolling Walker (2 wheels);Cane - single point;Grab bars -  toilet;Shower seat;Grab bars - tub/shower;Wheelchair - manual;BSC/3in1;Rollator (4 wheels);Tub bench   Additional Comments: Typically on 1.5 to 2 liters supplemental O2 at home      Prior Functioning/Environment Prior Level of Function : Independent/Modified Independent;History of Falls (last six months)             Mobility Comments: rollator for short distance, WC for community, at least 6 falls ADLs Comments: Independent with ADLs, daughter handles medications, and patient is able to complete finances    OT Problem List: Decreased strength;Decreased activity tolerance;Impaired balance (sitting and/or standing);Decreased safety awareness;Decreased knowledge of use of DME or AE;Pain   OT Treatment/Interventions: Self-care/ADL training;Therapeutic exercise;DME and/or AE instruction;Therapeutic activities;Patient/family education;Balance training      OT Goals(Current goals can be found in the care plan section)   Acute Rehab OT Goals Patient Stated Goal: to keep up being able to ambulate OT Goal Formulation: With patient Time For Goal Achievement: 09/09/23 Potential to Achieve Goals: Good   OT Frequency:  Min 2X/week    Co-evaluation PT/OT/SLP Co-Evaluation/Treatment: Yes Reason for Co-Treatment: Complexity of the patient's impairments (multi-system involvement)          AM-PAC OT 6 Clicks Daily Activity     Outcome Measure Help from another person eating meals?: None Help from another person taking care of personal grooming?: A Little Help from another person toileting, which includes using toliet, bedpan, or urinal?: A Little Help from another person bathing (including washing, rinsing, drying)?: A Little Help from another person to put on and taking  off regular upper body clothing?: A Little Help from another person to put on and taking off regular lower body clothing?: A Little 6 Click Score: 19   End of Session Equipment Utilized During Treatment: Gait  belt;Rolling walker (2 wheels) Nurse Communication: Mobility status  Activity Tolerance: Patient tolerated treatment well Patient left: in bed;with call bell/phone within reach;with bed alarm set (as pt waiting to go out for more testing)  OT Visit Diagnosis: Unsteadiness on feet (R26.81);Repeated falls (R29.6);Other abnormalities of gait and mobility (R26.89);Muscle weakness (generalized) (M62.81)                Time: 9065-9041 OT Time Calculation (min): 24 min Charges:  OT General Charges $OT Visit: 1 Visit OT Evaluation $OT Eval Low Complexity: 1 Low  Warrick POUR OTR/L  Acute Rehab Services  [phone] office number   Warrick Berber 08/26/2023, 11:28 AM

## 2023-08-27 ENCOUNTER — Encounter: Payer: Self-pay | Admitting: Internal Medicine

## 2023-08-27 LAB — GLUCOSE, CAPILLARY
Glucose-Capillary: 126 mg/dL — ABNORMAL HIGH (ref 70–99)
Glucose-Capillary: 155 mg/dL — ABNORMAL HIGH (ref 70–99)

## 2023-08-27 LAB — BASIC METABOLIC PANEL WITH GFR
Anion gap: 9 (ref 5–15)
BUN: 26 mg/dL — ABNORMAL HIGH (ref 8–23)
CO2: 24 mmol/L (ref 22–32)
Calcium: 8.4 mg/dL — ABNORMAL LOW (ref 8.9–10.3)
Chloride: 106 mmol/L (ref 98–111)
Creatinine, Ser: 2.79 mg/dL — ABNORMAL HIGH (ref 0.61–1.24)
GFR, Estimated: 22 mL/min — ABNORMAL LOW (ref >60)
Glucose, Bld: 129 mg/dL — ABNORMAL HIGH (ref 70–99)
Potassium: 3.3 mmol/L — ABNORMAL LOW (ref 3.5–5.1)
Sodium: 139 mmol/L (ref 135–145)

## 2023-08-27 MED ORDER — FAMOTIDINE 20 MG PO TABS: 20.0000 mg | ORAL_TABLET | Freq: Every day | ORAL | 0 refills | Status: DC

## 2023-08-27 MED ORDER — LORAZEPAM 0.5 MG PO TABS: 0.5000 mg | ORAL_TABLET | Freq: Every evening | ORAL | 0 refills | Status: DC | PRN

## 2023-08-27 MED ORDER — APIXABAN 5 MG PO TABS: 5.0000 mg | ORAL_TABLET | Freq: Two times a day (BID) | ORAL | 0 refills | Status: DC

## 2023-08-27 MED ORDER — APIXABAN 5 MG PO TABS: 5.0000 mg | ORAL_TABLET | Freq: Two times a day (BID) | ORAL | Status: DC

## 2023-08-27 MED ORDER — SENNOSIDES-DOCUSATE SODIUM 8.6-50 MG PO TABS: 1.0000 | ORAL_TABLET | Freq: Every evening | ORAL | 0 refills | Status: DC | PRN

## 2023-08-27 MED ORDER — HEPARIN SOD (PORK) LOCK FLUSH 100 UNIT/ML IV SOLN
500.0000 [IU] | Freq: Once | INTRAVENOUS | Status: AC
  Administered 2023-08-27: 500 [IU] via INTRAVENOUS

## 2023-08-27 MED ORDER — ACETAMINOPHEN 325 MG PO TABS: 650.0000 mg | ORAL_TABLET | Freq: Four times a day (QID) | ORAL | 0 refills | Status: DC | PRN

## 2023-08-27 MED ORDER — FUROSEMIDE 40 MG PO TABS: 40.0000 mg | ORAL_TABLET | Freq: Every day | ORAL | 0 refills | Status: DC | PRN

## 2023-08-27 MED ORDER — SERTRALINE HCL 100 MG PO TABS: 100.0000 mg | ORAL_TABLET | Freq: Every morning | ORAL | 0 refills | Status: DC

## 2023-08-27 NOTE — TOC Transition Note (Signed)
 Transition of Care [facility]) - Discharge Note   Patient Details  Name: Jeffery Fernandez MRN: [id number] Date of Birth: 11-05-1945  [facility]) CM/SW Contact:  Waddell Barnie Rama, RN Phone Number: 08/27/2023, 2:26 PM   Clinical Narrative:    Per MD , patient is not going to have the pluerx drain placed, does not want to stay another day in the hospital to have it done, wants to go home with hospice.  Family member will transport patient home.  NCM notified Melissa with [facility] that patient is going home.      Barriers to Discharge: Continued Medical Work up   Patient Goals and CMS Choice Patient states their goals for this hospitalization and ongoing recovery are:: To go home CMS Medicare.gov Compare Post Acute Care list provided to:: Patient Represenative (must comment) Choice offered to / list presented to : Adult Children, Spouse      Discharge Placement                       Discharge Plan and Services Additional resources added to the After Visit Summary for   In-house Referral: Hospice / Palliative Care Discharge Planning Services: CM Consult Post Acute Care Choice: Hospice                               Social Drivers of Health (SDOH) Interventions SDOH Screenings   Food Insecurity: No Food Insecurity (08/23/2023)  Housing: Low Risk  (08/23/2023)  Transportation Needs: No Transportation Needs (08/23/2023)  Utilities: Not At Risk (08/23/2023)  Depression (PHQ2-9): Medium Risk (01/09/2023)  Financial Resource Strain: Low Risk  (04/20/2023)   Received from [facility]  Physical Activity: Unknown (04/20/2023)   Received from [facility]  Social Connections: Moderately Integrated (08/23/2023)  Stress: Stress Concern Present (04/20/2023)   [facility] from [facility]  Tobacco Use: Medium Risk (08/23/2023)     Readmission Risk Interventions    01/02/2023   12:27 PM  Readmission Risk Prevention Plan  Transportation Screening Complete   PCP or Specialist Appt within 5-7 Days Complete  Home Care Screening Complete  Medication Review (RN CM) Complete

## 2023-08-27 NOTE — Discharge Instructions (Addendum)
 Jeffery Fernandez,  You were recently admitted to [facility] for shortness of breath and worsening of your heart failure.   Continue taking your home medications with the following changes  Start taking:  Tylenol  325mg , every 6 hours as needed for pain  Senna-docusate 1 tablet nightly as needed for constipation Stop taking:  Atorvastatin  40mg ,  All your diabetes medicines, including insulin  Lantus  and Trulicity .  Change how you take: Lorazepam  (Ativan ) 0.5mg  nightly as needed for anxiety Continue taking: Eliquis  5mg  twice a day Wellbutrin  150mg  every morning Pepcid  20mg  daily  Hydrocodone -Acetaminophen  (Norco) 5-325mg  every 6 hours as needed for pain Clartitin (Loratadine ) 10mg  daily for allergies Zofran  (ondansetron ) 4mg  every 8 hours as needed for nausea/vomiting Home oxygen  Zoloft  (setraline) 100mg    Your hospice team may change your medications and if they do you should follow their recommendations. You should discuss with your hospice provider about any worsening shortness of breath your lungs despite using your Lasix , or any new symptoms.  We are so glad that you are feeling better.  Sincerely, Jaden Amilibia, DO

## 2023-08-27 NOTE — Progress Notes (Signed)
 Explained discharge instructions to patient. Reviewed follow up appointment and next medication administration times. Also reviewed education. Patient verbalized having an understanding for instructions given. All belongings are in the patient's possession. IV and telemetry were removed by Fleeta, patient's NT. CCMD was notified. No other needs verbalized. Transporting downstairs for discharge.

## 2023-08-27 NOTE — Plan of Care (Signed)
  Problem: Education: Goal: Knowledge of General Education information will improve Description: Including pain rating scale, medication(s)/side effects and non-pharmacologic comfort measures Outcome: Progressing   Problem: Clinical Measurements: Goal: Will remain free from infection Outcome: Progressing Goal: Respiratory complications will improve Outcome: Progressing   Problem: Activity: Goal: Risk for activity intolerance will decrease Outcome: Progressing   Problem: Pain Managment: Goal: General experience of comfort will improve and/or be controlled Outcome: Progressing

## 2023-08-27 NOTE — Progress Notes (Signed)
 PHARMACY - ANTICOAGULATION CONSULT NOTE  Pharmacy Consult for apixaban  Indication: DVT  Allergies  Allergen Reactions   Amlodipine  Swelling    Patient report facial swelling at high doses   Penicillins Other (See Comments)    05/06/23 tolerated cefazolin ;   Hydralazine  Other (See Comments)    Dizziness    Patient Measurements: Height: 5' 9 (175.3 cm) Weight: 68.9 kg (151 lb 14.4 oz) IBW/kg (Calculated) : 70.7 HEPARIN  DW (KG): 74.7  Vital Signs: Temp: 97.5 F (36.4 C) (07/30 0811) Temp Source: Oral (07/30 0811) BP: 108/82 (07/30 0811) Pulse Rate: 109 (07/30 0811)  Labs: Recent Labs    08/25/23 0300 08/26/23 0224 08/27/23 0609  HGB 10.2*  --   --   HCT 31.3*  --   --   PLT 176  --   --   CREATININE 2.71* 2.96* 2.79*    Estimated Creatinine Clearance: 20.6 mL/min (A) (by C-G formula based on SCr of 2.79 mg/dL (H)).   Medical History: Past Medical History:  Diagnosis Date   Acid reflux    Arrhythmia    Arthritis    Cataract    bilateral cataract extraction with intraoccular lens implants   Chest pain, unspecified    CHF (congestive heart failure) (HCC)    Chronic airway obstruction, not elsewhere classified    PT. DENIES HE HAS COPD   CKD (chronic kidney disease)    Colon polyps    Adenomatous Polyps 2007   Depression    Diabetes mellitus    Diverticulosis    Gout    Heart murmur    History of kidney stones    History of radiation therapy    Thoracic Spine- 01/27/23-02/10/23- Dr. Lynwood Nasuti   Hyperlipemia    Hypertension    Lung cancer (HCC) 12/2022   Nephrolithiasis    Normocytic anemia 04/23/2017   Obstructive sleep apnea (adult) (pediatric)    Other and unspecified hyperlipidemia    Other left bundle branch block    Retinal vascular occlusion, unspecified    right   Shortness of breath    on exertion   Unspecified sleep apnea    Vision loss of right eye    White coat syndrome with diagnosis of hypertension 09/19/2017    Medications:   Scheduled:   apixaban   5 mg Oral BID   buPROPion   150 mg Oral q morning   Chlorhexidine  Gluconate Cloth  6 each Topical Daily   famotidine   20 mg Oral Daily   sertraline   100 mg Oral q AM   sodium chloride  flush  10-40 mL Intracatheter Q12H    Assessment: Shared decision making with patient, family and team indicates a preference for VTE progression from DVT to PE > than concern for potential for bleeding complications.   Plan:  Commencing with this evening's dose, will change to apixaban  5 mg PO BID.  Lynwood KATHEE Lites, PharmD, CPP Clinical Pharmacist Practitioner  08/27/2023,11:14 AM

## 2023-08-27 NOTE — Plan of Care (Signed)

## 2023-08-27 NOTE — Discharge Summary (Addendum)
 Name: Jeffery Fernandez MRN: [id number] DOB: 16-Jan-1946 78 y.o. PCP: Pura Lenis, MD  Date of Admission: 08/23/2023 12:55 PM Date of Discharge: 08/27/2023  Attending Physician: Dr. 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 [facility])   DVT of lower extremity (deep venous thrombosis) (HCC)   HFrEF (heart failure with reduced ejection fraction) (HCC)   Goals of care, counseling/discussion    Discharge Medications: Allergies as of 08/27/2023       Reactions   Amlodipine  Swelling   Patient report facial swelling at high doses   Penicillins Other (See Comments)   05/06/23 tolerated cefazolin ;   Hydralazine  Other (See Comments)   Dizziness        Medication List     STOP taking these medications    atorvastatin  40 MG tablet Commonly known as: LIPITOR   insulin  glargine 100 UNIT/ML injection Commonly known as: LANTUS    Trulicity  1.5 MG/0.5ML Soaj Generic drug: Dulaglutide        TAKE these medications    acetaminophen  325 MG tablet Commonly known as: TYLENOL  Take 2 tablets (650 mg total) by mouth every 6 (six) hours as needed for mild pain (pain score 1-3) or fever (or Fever >/= 101).   apixaban  5 MG Tabs tablet Commonly known as: ELIQUIS  Take 1 tablet (5 mg total) by mouth 2 (two) times daily.   buPROPion  150 MG 24 hr tablet Commonly known as: WELLBUTRIN  XL Take 150 mg by mouth every morning.   famotidine  20 MG tablet Commonly known as: PEPCID  Take 1 tablet (20 mg total) by mouth daily. Start taking on: August 28, 2023   furosemide  40 MG  tablet Commonly known as: Lasix  Take 1 tablet (40 mg total) by mouth daily as needed for fluid.   HYDROcodone -acetaminophen  5-325 MG tablet Commonly known as: NORCO/VICODIN Take 1 tablet by mouth every 6 (six) hours as needed for moderate pain (pain score 4-6).   loratadine  10 MG tablet Commonly known as: CLARITIN  Take 10 mg by mouth daily.   LORazepam  0.5 MG tablet Commonly known as: ATIVAN  Take 1 tablet (0.5 mg total) by mouth at bedtime as needed for anxiety. What changed:  when to take this additional instructions   ondansetron  4 MG tablet Commonly known as: Zofran  Take 1 tablet (4 mg total) by mouth every 8 (eight) hours as needed for nausea or vomiting.   OXYGEN  Inhale 1.5 L into the lungs daily as needed (Shortness of breath).   senna-docusate 8.6-50 MG tablet Commonly known as: Senokot-S Take 1 tablet by mouth at bedtime as needed for mild constipation.   sertraline  100 MG tablet Commonly known as: ZOLOFT  Take 1 tablet (100 mg total) by mouth in the morning. Start taking on: August 28, 2023               Durable Medical Equipment  (From admission, onward)           Start     Ordered   08/25/23 1546  For home use only DME oxygen   Once       Question Answer Comment  Length of Need 6 Months   Mode or (Route) Nasal cannula   Frequency Continuous (stationary and portable oxygen  unit needed)   Oxygen  conserving device Yes   Oxygen  delivery system Gas      08/25/23 1546            Disposition and follow-up:   Jeffery Fernandez was discharged from [facility] in Good condition.  At the hospital follow up visit please address:  Follow-up Appointments:  Follow-up Information     [facility] Follow up.   Specialty: Hospice and Palliative Medicine Why: home with hospice Contact information: [address] [place]  [address] [phone]                Hospital Course by problem list: Jeffery Fernandez is a 78 y.o. person living with a history of metastatic NSCLC on chemotherapy, recurrent pleural effusion, HFpEF, DMII (last A1C 5.0 08/20/23), Depression, anxiety, CKDIV, HTN, HLD who presented with shortness of breath and admitted for acute on chronic respiratory failure.   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 No sick symptoms. Chest x-ray with worsening opacity of the right lung and pleural effusions. Differential includes pleural effusion, pneumonia, and PE. Has had recurrent pleural effusions with two thoracentesis in the last month per report, although there are only records for the thoracentesis on 7/17, performed at [facility], likely in the setting of his malignancy, but could be in the setting of his worsening heart failure, discussed below. Radiology performed thoracentesis and removed 2.25L of transudative effusion.  Pneumonia less likely given clinical improvement without antibiotics and he is without fever or leukocytosis. PE less likely as he has been taking Eliquis  5mg  BID recently.  VQ scan without PE.  He eventually elected to start hospice care and plan was to originally get an indwelling catheter placed for continued effusion management however there was inadequate fluid to safely place this catheter x2 days. Patient and family elected for discharge without catheter placement.  Fluid management with as needed Lasix  40 mg daily that can be titrated by hospice provider.     #HFrEF (<20%) #Elevated BNP #CAD Previous echo 11/25/22 showed EF of 55-60%. Had a catheterization in 2012 with 60-70% in the RCA. Cardiology consulted due to newly reduced EF to <20% and global hypokinesis, not a candidate for aggressive care given hx of worsening and uncurable lung cancer due to intolerance of immunotherapy.  Worsening EF could be due to  chemotherapy toxicity.  Did well with IV diuresis although developed an AKI and underwent right thoracentesis during admission.  As above plan for as needed Lasix  40 mg daily with hospice care for symptomatic control of fluid overload.  Discharged on room air.  Afterload reduction with Imdur  and hydralazine  was discontinued due to orthostatic hypotension and risk of falls.   #CKD IV Baseline around 2.5. Cr 2.37 on admission. Peaked at 2.96, likely in the setting of diuresis and thoracentesis.  This was improved after holding scheduled diuresis.  #DMII  Hypoglycemic on admission with significant medications of Lantus  and Trulicity  at home.  A1c 5.0 recently.  All diabetes medications discontinued with transition to hospice.      #Chronic Anemia Hb on admission 10.6 around baseline of 10. Suspect anemia of chronic disease due to CKD and NSCLC.  Stable during admission.   #Depression/Anxiety Continued home medications of  Ativan  0.5mg  at bedtime PRN, Zoloft  100mg , and Buproprion 150mg .   #GERD Continued home Famotidine  20mg  daily    #Hx of LLE DVT Continued home Eliquis  5mg  BID after shared decision making with the patient and his daughter as he transitions to hospice.      Discharge Subjective: Doing well this morning without any new or worsening complaints.  Continues on room air.  Discussed radiology plan to repeat chest x-ray tomorrow and the patient would like to go home instead of waiting for possible pleural catheter tomorrow.  Discharge Exam:   BP 131/85 (BP Location: Left Arm)   Pulse 90   Temp 97.7 F (36.5 C) (Oral)   Resp 20   Ht 5' 9 (1.753 m)   Wt 68.9 kg   SpO2 100%   BMI 22.43 kg/m  Constitutional: well-appearing elderly male sitting in bed, in no acute distress Pulmonary/Chest: normal work of breathing on room air MSK: No lower extremity edema Neurological: alert & oriented x 3, 5/5 strength in bilateral upper and lower extremities, normal gait Skin: warm and  dry  Pertinent Labs, Studies, and Procedures:     Latest Ref Rng & Units 08/25/2023    3:00 AM 08/24/2023    3:37 AM 08/23/2023    1:13 PM  CBC  WBC 4.0 - 10.5 K/uL 6.9  7.1  7.0   Hemoglobin 13.0 - 17.0 g/dL 89.7  89.2  89.3   Hematocrit 39.0 - 52.0 % 31.3  33.0  33.7   Platelets 150 - 400 K/uL 176  182  191        Latest Ref Rng & Units 08/27/2023    6:09 AM 08/26/2023    2:24 AM 08/25/2023    3:00 AM  CMP  Glucose 70 - 99 mg/dL 870  874  98   BUN 8 - 23 mg/dL 26  30  27    Creatinine 0.61 - 1.24 mg/dL 7.20  7.03  7.28   Sodium 135 - 145 mmol/L 139  136  138   Potassium 3.5 - 5.1 mmol/L 3.3  3.4  3.6   Chloride 98 - 111 mmol/L 106  101  103   CO2 22 - 32 mmol/L 24  26  26    Calcium  8.9 - 10.3 mg/dL 8.4  8.2  8.5     US  THORACENTESIS ASP PLEURAL SPACE W/IMG GUIDE Result Date: 08/26/2023 INDICATION: 78 year old male with history of lung cancer, recurrent right pleural effusion. IR was requested for diagnostic and therapeutic right-sided thoracentesis. EXAM: ULTRASOUND GUIDED DIAGNOSTIC AND THERAPEUTIC RIGHT-SIDED THORACENTESIS MEDICATIONS: 7 cc of 1% lidocaine  COMPLICATIONS: None immediate. PROCEDURE: An ultrasound guided thoracentesis was thoroughly discussed with the patient and questions answered. The benefits, risks, alternatives and complications were also discussed. The patient understands and wishes to proceed with the procedure. Written consent was obtained. Ultrasound was performed to localize and mark an adequate pocket of fluid in the right chest. The area was then prepped and draped in the normal sterile fashion. 1% Lidocaine  was used for local anesthesia. Under ultrasound guidance a 6 Fr Safe-T-Centesis catheter was introduced. Thoracentesis was performed. The catheter was removed and a dressing applied. FINDINGS: A total of approximately 2.25 L of clear, straw-colored pleural fluid was removed. Samples were sent to the laboratory as requested by the clinical team. IMPRESSION:  Successful ultrasound guided right thoracentesis yielding 2.25 L of pleural fluid. Procedure performed by Carlin Griffon, PA-C Electronically Signed   By: Marcey Moan M.D.   On: 08/26/2023 07:40  VAS US  LOWER EXTREMITY VENOUS (DVT) (7a-7p) Result Date: 08/25/2023  Lower Venous DVT Study Patient Name:  ADMIR CANDELAS  Date of Exam:   08/24/2023 Medical Rec #: [id number]      Accession #:    [id number] Date of Birth: [birth year]       Patient Gender: M Patient Age:   59 years Exam Location:  [facility] Procedure:      VAS US  LOWER EXTREMITY VENOUS (DVT) Referring Phys: WARREN BARRETT --------------------------------------------------------------------------------  Indications: Swelling, and SOB.  Risk Factors: Lung cancer with mets to to bone on chemo/immunotherapy DVT 07/04/23 Age indeterminate DVT in one of the paired left posterior tibial veins. Anticoagulation: Eliquis . Comparison Study: Prior left LEV done 07/04/23 Performing Technologist: Alberta Lis RVS  Examination Guidelines: A complete evaluation includes B-mode imaging, spectral Doppler, color Doppler, and power Doppler as needed of all accessible portions of each vessel. Bilateral testing is considered an integral part of a complete examination. Limited examinations for reoccurring indications may be performed as noted. The reflux portion of the exam is performed with the patient in reverse Trendelenburg.  +-----+---------------+---------+-----------+----------+--------------+ RIGHTCompressibilityPhasicitySpontaneityPropertiesThrombus Aging +-----+---------------+---------+-----------+----------+--------------+ CFV  Full           Yes      No                                  +-----+---------------+---------+-----------+----------+--------------+ SFJ  Full                                                        +-----+---------------+---------+-----------+----------+--------------+    +---------+---------------+---------+-----------+----------+-----------------+ LEFT     CompressibilityPhasicitySpontaneityPropertiesThrombus Aging    +---------+---------------+---------+-----------+----------+-----------------+ CFV      Full           Yes      No                                     +---------+---------------+---------+-----------+----------+-----------------+ SFJ      Full                                                           +---------+---------------+---------+-----------+----------+-----------------+ FV Prox  Full           Yes      No                                     +---------+---------------+---------+-----------+----------+-----------------+ FV Mid   Full                                                           +---------+---------------+---------+-----------+----------+-----------------+ FV DistalFull                                                           +---------+---------------+---------+-----------+----------+-----------------+  PFV      Full           Yes      No                                     +---------+---------------+---------+-----------+----------+-----------------+ POP      Full           Yes      No                                     +---------+---------------+---------+-----------+----------+-----------------+ PTV      Partial                                      Age Indeterminate +---------+---------------+---------+-----------+----------+-----------------+ PERO     Full                                                           +---------+---------------+---------+-----------+----------+-----------------+ Gastroc  Full                                                           +---------+---------------+---------+-----------+----------+-----------------+     Summary: RIGHT: - No evidence of common femoral vein obstruction.   LEFT: - Findings consistent with age indeterminate deep vein  thrombosis involving one of the paired left posterior tibial veins.  - Findings appear essentially unchanged compared to previous examination.  *See table(s) above for measurements and observations. Electronically signed by Norman Serve on 08/25/2023 at 2:33:43 PM.    Final    DG Chest 1 View Result Date: 08/24/2023 CLINICAL DATA:  Status post thoracentesis EXAM: CHEST  1 VIEW COMPARISON:  chest x-ray 08/23/2023 FINDINGS: Left-sided chest port catheter tip projects over the distal SVC. The heart and mediastinal contours are enlarged, unchanged. Right upper lobe airspace disease has mildly decreased. There are small pleural effusions, unchanged. No pneumothorax or acute fracture identified. Cervical spinal fusion plate present. IMPRESSION: 1. No pneumothorax. 2. Mild decrease in right upper lobe airspace disease. 3. Stable small pleural effusions. Electronically Signed   By: Greig Pique M.D.   On: 08/24/2023 15:34   ECHOCARDIOGRAM COMPLETE Result Date: 08/23/2023    ECHOCARDIOGRAM REPORT   Patient Name:   RIVERS HAMRICK Date of Exam: 08/23/2023 Medical Rec #:  [id number]     Height:       70.0 in Accession #:    [id number]    Weight:       164.1 lb Date of Birth:  12-12-1945      BSA:          1.919 m Patient Age:    78 years      BP:           148/104 mmHg Patient Gender: M             HR:  110 bpm. Exam Location:  Inpatient Procedure: 2D Echo (Both Spectral and Color Flow Doppler were utilized during            procedure). STAT ECHO Indications:    dyspnea  History:        Patient has prior history of Echocardiogram examinations, most                 recent 11/25/2022. CAD, COPD, Arrythmias:LBBB; Risk                 Factors:Diabetes, Sleep Apnea, Hypertension and Dyslipidemia.  Sonographer:    Tinnie Barefoot RDCS Referring Phys: [phone] ROBERT C PATERSON IMPRESSIONS  1. Left ventricular ejection fraction, by estimation, is <20%. The left ventricle has severely decreased function. The left ventricle  demonstrates global hypokinesis. The left ventricular internal cavity size was dilated. Left ventricular diastolic function could not be evaluated.  2. Right ventricular systolic function is mildly reduced. The right ventricular size is normal. Tricuspid regurgitation signal is inadequate for assessing PA pressure.  3. Left atrial size was moderately dilated.  4. The mitral valve is degenerative. Moderate mitral valve regurgitation. No evidence of mitral stenosis.  5. The aortic valve is grossly normal. Aortic valve regurgitation is mild. Aortic valve sclerosis is present, with no evidence of aortic valve stenosis.  6. The inferior vena cava is normal in size with greater than 50% respiratory variability, suggesting right atrial pressure of 3 mmHg. Comparison(s): A prior study was performed on 11/25/2022. LVEF significantly reduced compared to prior study (previously reported 55-60%). Conclusion(s)/Recommendation(s): Results conveyed to ordering physician Dr. Jakie via secure chat (message reviewed). Also notified primary team (Dr. Zheng and Smucker) Dr. Elicia acknowledged the message. FINDINGS  Left Ventricle: Left ventricular ejection fraction, by estimation, is <20%. The left ventricle has severely decreased function. The left ventricle demonstrates global hypokinesis. The left ventricular internal cavity size was dilated. There is no left ventricular hypertrophy. Left ventricular diastolic function could not be evaluated due to nondiagnostic images. Left ventricular diastolic function could not be evaluated. Right Ventricle: The right ventricular size is normal. Right vetricular wall thickness was not well visualized. Right ventricular systolic function is mildly reduced. Tricuspid regurgitation signal is inadequate for assessing PA pressure. Left Atrium: Left atrial size was moderately dilated. Right Atrium: Right atrial size was normal in size. Pericardium: There is no evidence of pericardial effusion.  Mitral Valve: The mitral valve is degenerative in appearance. Mild mitral annular calcification. Moderate mitral valve regurgitation. No evidence of mitral valve stenosis. Tricuspid Valve: The tricuspid valve is grossly normal. Tricuspid valve regurgitation is not demonstrated. No evidence of tricuspid stenosis. Aortic Valve: The aortic valve is grossly normal. Aortic valve regurgitation is mild. Aortic valve sclerosis is present, with no evidence of aortic valve stenosis. Pulmonic Valve: The pulmonic valve was not well visualized. Pulmonic valve regurgitation is not visualized. No evidence of pulmonic stenosis. Aorta: The aortic root is normal in size and structure and the ascending aorta was not well visualized. Venous: The inferior vena cava is normal in size with greater than 50% respiratory variability, suggesting right atrial pressure of 3 mmHg. IAS/Shunts: The interatrial septum was not well visualized.  LEFT VENTRICLE PLAX 2D LVIDd:         5.10 cm LVIDs:         5.00 cm LV PW:         1.10 cm LV IVS:        1.10 cm LVOT diam:  1.90 cm LV SV:         22 LV SV Index:   12 LVOT Area:     2.84 cm  RIGHT VENTRICLE            IVC RV Basal diam:  3.10 cm    IVC diam: 2.00 cm RV S prime:     9.68 cm/s TAPSE (M-mode): 1.5 cm LEFT ATRIUM           Index        RIGHT ATRIUM           Index LA diam:      3.70 cm 1.93 cm/m   RA Area:     14.00 cm LA Vol (A4C): 87.5 ml 45.60 ml/m  RA Volume:   34.50 ml  17.98 ml/m  AORTIC VALVE LVOT Vmax:   62.20 cm/s LVOT Vmean:  38.600 cm/s LVOT VTI:    0.078 m  AORTA Ao Root diam: 3.20 cm  SHUNTS Systemic VTI:  0.08 m Systemic Diam: 1.90 cm Sunit Tolia Electronically signed by Madonna Large Signature Date/Time: 08/23/2023/5:16:09 PM    Final    DG Chest 2 View Result Date: 08/23/2023 CLINICAL DATA:  Shortness of breath. EXAM: CHEST - 2 VIEW COMPARISON:  08/14/2023 FINDINGS: Stable enlarged cardiac silhouette. Left jugular catheter tip in the superior aspect of the right  atrium, proximally 1 cm inferior to the superior cavoatrial junction. Increased airspace consolidation in the right lung, most pronounced in the outer upper lung zone. Clear left lung. Small bilateral pleural effusions. Mildly elevated right hemidiaphragm. Cervical spine fixation hardware. IMPRESSION: 1. Increased right lung airspace consolidation, most pronounced in the outer upper lung zone, consistent with pneumonia. 2. Mild right basilar atelectasis. 3. Small bilateral pleural effusions. 4. Stable cardiomegaly. Electronically Signed   By: Elspeth Bathe M.D.   On: 08/23/2023 14:08     Discharge Instructions:   Discharge Instructions      Samuel H Kinnear,  You were recently admitted to [facility] for shortness of breath and worsening of your heart failure.   Continue taking your home medications with the following changes  Start taking:  Tylenol  325mg , every 6 hours as needed for pain  Senna-docusate 1 tablet nightly as needed for constipation Stop taking:  Atorvastatin  40mg ,  All your diabetes medicines, including insulin  Lantus  and Trulicity .  Change how you take: Lorazepam  (Ativan ) 0.5mg  nightly as needed for anxiety Continue taking: Eliquis  5mg  twice a day Wellbutrin  150mg  every morning Pepcid  20mg  daily  Hydrocodone -Acetaminophen  (Norco) 5-325mg  every 6 hours as needed for pain Clartitin (Loratadine ) 10mg  daily for allergies Zofran  (ondansetron ) 4mg  every 8 hours as needed for nausea/vomiting Home oxygen  Zoloft  (setraline) 100mg    Your hospice team may change your medications and if they do you should follow their recommendations. You should discuss with your hospice provider about any worsening shortness of breath your lungs despite using your Lasix , or any new symptoms.  We are so glad that you are feeling better.  Sincerely, Karen Huhta, DO     Signed: Delaila Nand, DO Internal Medicine Resident, PGY-1 08/27/2023, 7:22 PM   Please contact the on call  pager after 5 pm and on weekends at [phone].

## 2023-08-27 NOTE — Progress Notes (Signed)
 Mobility Specialist Progress Note:    08/27/23 1052  Mobility  Activity Ambulated with assistance  Level of Assistance Contact guard assist, steadying assist  Assistive Device Front wheel walker  Distance Ambulated (ft) 200 ft  Activity Response Tolerated well  Mobility Referral Yes  Mobility visit 1 Mobility  Mobility Specialist Start Time (ACUTE ONLY) 1052  Mobility Specialist Stop Time (ACUTE ONLY) 1112  Mobility Specialist Time Calculation (min) (ACUTE ONLY) 20 min   Pt pleasant and agreeable to session. Doesn't like to use RW but will if given no choice. Pt ambulates decently but has a tendency to push the walker towards the left and right. No c/o any symptoms. Left pt in recliner w/ daughter and all needs met.   Venetia Keel Mobility Specialist Please Neurosurgeon or Rehab Office at [phone]

## 2023-08-28 ENCOUNTER — Ambulatory Visit

## 2023-08-28 ENCOUNTER — Ambulatory Visit: Admitting: Internal Medicine

## 2023-08-28 ENCOUNTER — Other Ambulatory Visit

## 2023-08-28 LAB — BODY FLUID CULTURE W GRAM STAIN: Culture: NO GROWTH

## 2023-08-29 NOTE — Procedures (Signed)
Mask fit

## 2023-09-04 ENCOUNTER — Inpatient Hospital Stay

## 2023-09-04 ENCOUNTER — Inpatient Hospital Stay: Admitting: Internal Medicine

## 2023-09-04 ENCOUNTER — Inpatient Hospital Stay: Admitting: Dietician

## 2023-09-25 ENCOUNTER — Other Ambulatory Visit

## 2023-09-25 ENCOUNTER — Ambulatory Visit

## 2023-09-25 ENCOUNTER — Ambulatory Visit: Admitting: Physician Assistant

## 2023-10-29 DEATH — deceased

## 2023-12-19 ENCOUNTER — Other Ambulatory Visit (HOSPITAL_COMMUNITY): Payer: Self-pay
